# Patient Record
Sex: Male | Born: 1945 | Race: White | Hispanic: No | Marital: Married | Smoking: Former smoker
Health system: Southern US, Community
[De-identification: ages and names within clinical notes are randomized; demographics above are authoritative.]

## PROBLEM LIST (undated history)

## (undated) DIAGNOSIS — J449 Chronic obstructive pulmonary disease, unspecified: Secondary | ICD-10-CM

## (undated) DIAGNOSIS — F329 Major depressive disorder, single episode, unspecified: Secondary | ICD-10-CM

## (undated) DIAGNOSIS — F32A Depression, unspecified: Secondary | ICD-10-CM

## (undated) DIAGNOSIS — M549 Dorsalgia, unspecified: Secondary | ICD-10-CM

## (undated) DIAGNOSIS — E785 Hyperlipidemia, unspecified: Secondary | ICD-10-CM

## (undated) DIAGNOSIS — G8929 Other chronic pain: Secondary | ICD-10-CM

## (undated) DIAGNOSIS — G4730 Sleep apnea, unspecified: Secondary | ICD-10-CM

## (undated) DIAGNOSIS — I10 Essential (primary) hypertension: Secondary | ICD-10-CM

## (undated) DIAGNOSIS — C649 Malignant neoplasm of unspecified kidney, except renal pelvis: Secondary | ICD-10-CM

## (undated) DIAGNOSIS — E119 Type 2 diabetes mellitus without complications: Secondary | ICD-10-CM

## (undated) HISTORY — PX: OTHER SURGICAL HISTORY: SHX169

## (undated) HISTORY — PX: GASTRIC BYPASS: SHX52

## (undated) HISTORY — PX: CHOLECYSTECTOMY: SHX55

## (undated) HISTORY — PX: FLEXIBLE SIGMOIDOSCOPY: SHX1649

## (undated) HISTORY — PX: CARPAL TUNNEL RELEASE: SHX101

## (undated) HISTORY — PX: NEPHRECTOMY: SHX65

## (undated) HISTORY — PX: UPPER GI ENDOSCOPY: SHX6162

## (undated) NOTE — *Deleted (*Deleted)
PROGRESS NOTE  Ad Glave [id number] DOB: [birth year] DOA: 11/07/2020 PCP: Derinda Late, MD   LOS: 2 days   Brief narrative: As per HPI,  [name]  is a 43 y.o. Caucasian male with a known history of type 2 diabetes mellitus, hypertension, COPD, dyslipidemia and obstructive sleep apnea,  end-stage renal disease on hemodialysis, presented to the emergency room with acute onset of blurred vision and has chronic right sided hearing loss.  The patient had an outpatient MRI that revealed multiple punctate acute to subacute infarcts scattered in both superior hemispheres with no hemorrhage or mass-effect.  This was suspicious for embolic event or watershed ischemia. It also showed chronic small vessel ischemic disease.  Noncontrast dedicated internal auditory imaging was negative.    In the ED, vital signs were within normal.  Labs revealed hypokalemia of 3.1 with a BUN of 47 and creatinine 3.84 and calcium was 7.7.  CBC showed anemia. EKG showed normal sinus rhythm with rate of 98 with right bundle branch block and left anterior fascicular block (bifascicular block) and Q waves inferiorly.  Patient was then admitted to hospital for acute CVA.  Neurology was consulted.   Assessment/Plan:  Principal Problem:   Acute CVA (cerebrovascular accident) [facility]) Active Problems:   Essential hypertension   Type II diabetes mellitus with renal manifestations ([place])   Anemia in chronic kidney disease   Chronic obstructive pulmonary disease ([place])   History of left nephrectomy 2004  Bilateral ischemic stroke Neurology on board with suspicion for watershed infarction.  Patient is already on Eliquis for history of DVT PE.  Neurology recommending PT, OT speech therapy.  PT evaluation pending.     2D echocardiogram with left ventricular ejection fraction of 70 to 75% with no regional wall motion abnormality.  Pending carotid duplex.  On low-dose Coreg, blood pressure seems to be stable.   End-stage renal disease on hemodialysis. On MWF schedule. Nephrology on board for hemodialysis.  Plan for hemodialysis today   Hypokalemia. Improved with replacement.  Potassium of 4.4 today.  Type 2 diabetes mellitus with peripheral neuropathy. Continue sliding scale insulin while in the hospital, Accu-Cheks, diabetic diet  Restless leg syndrome. Continue Requip.  GERD. Continue PPI therapy and Carafate.  COPD without exacerbation. Continue inhalers  Essential hypertension. Continue Coreg.  DVT prophylaxis: apixaban (ELIQUIS) tablet 2.5 mg Start: 11/07/20 2200 apixaban (ELIQUIS) tablet 2.5 mg    Code Status: Full code  Family Communication: ??  I spoke with wife Ms. Zanin on the phone and updated her about the clinical condition of the patient.  Status is: Inpatient  Remains inpatient appropriate because:IV treatments appropriate due to intensity of illness or inability to take PO and Inpatient level of care appropriate due to severity of illness   Dispo: The patient is from: Home              Anticipated d/c is to: Home              Anticipated d/c date is: 2 days              Patient currently is not medically stable to d/c.??  Consultants:  Neurology  Procedures:  None  Antibiotics:  . None  Anti-infectives (From admission, onward)   None     Subjective: Today,   ??  patient was seen and examined at bedside.  Patient complains of hearing loss for some time now, including mild right-sided weakness which he was getting physical therapy as outpatient.  Complains  of mild blurry vision.  Denies any speech difficulty or swallowing.  Objective: Vitals:   11/08/20 2300 11/09/20 0412  BP: 126/79 139/81  Pulse: 78 76  Resp: 18 20  Temp: 98 F (36.7 C) 98 F (36.7 C)  SpO2: 98% 98%    Intake/Output Summary (Last 24 hours) at 11/09/2020 0804 Last data filed at 11/09/2020 0600 Gross per 24 hour  Intake 2198.81 ml  Output 3600 ml  Net  -1401.19 ml   Filed Weights   11/07/20 1728  Weight: 111.1 kg   Body mass index is 33.23 kg/m.   Physical Exam:  GENERAL: Patient is alert awake and oriented. Not in obvious distress.  Obese HENT: No scleral pallor or icterus. Pupils equally reactive to light. Oral mucosa is moist NECK: is supple, no gross swelling noted. CHEST: Clear to auscultation. No crackles or wheezes.  Diminished breath sounds bilaterally. CVS: S1 and S2 heard, no murmur. Regular rate and rhythm.  ABDOMEN: Soft, non-tender, bowel sounds are present. EXTREMITIES: No edema. CNS: Right-sided hearing difficulty.  Moving all extremities SKIN: warm and dry without rashes.  Data Review: I have personally reviewed the following laboratory data and studies,  CBC: Recent Labs  Lab 11/07/20 1733 11/09/20 0519  WBC 8.6 7.8  NEUTROABS 6.6  --   HGB 8.6* 8.6*  HCT 26.4* 26.0*  MCV 94.3 94.2  PLT 139* [id number]*   Basic Metabolic Panel: Recent Labs  Lab 11/07/20 1733 11/09/20 0519  NA 143 144  K 3.1* 4.4  CL 108 112*  CO2 23 23  GLUCOSE 152* 126*  BUN 47* 56*  CREATININE 3.84* 3.98*  CALCIUM 7.7* 7.8*  MG 1.6* 2.0   Liver Function Tests: Recent Labs  Lab 11/07/20 1733 11/09/20 0519  AST 15 12*  ALT 14 14  ALKPHOS 117 112  BILITOT 0.4 0.5  PROT 6.8 6.1*  ALBUMIN 3.7 3.3*   No results for input(s): LIPASE, AMYLASE in the last 168 hours. No results for input(s): AMMONIA in the last 168 hours. Cardiac Enzymes: No results for input(s): CKTOTAL, CKMB, CKMBINDEX, TROPONINI in the last 168 hours. BNP (last 3 results) Recent Labs    03/25/20 0557  BNP 31.0    ProBNP (last 3 results) No results for input(s): PROBNP in the last 8760 hours.  CBG: Recent Labs  Lab 11/08/20 0804 11/08/20 1200 11/08/20 1643  GLUCAP 88 124* 147*   Recent Results (from the past 240 hour(s))  Resp Panel by RT-PCR (Flu A&B, Covid) Nasopharyngeal Swab     Status: None   Collection Time: 11/07/20 10:38 PM    Specimen: Nasopharyngeal Swab; Nasopharyngeal(NP) swabs in vial transport medium  Result Value Ref Range Status   SARS Coronavirus 2 by RT PCR NEGATIVE NEGATIVE Final    Comment: (NOTE) SARS-CoV-2 target nucleic acids are NOT DETECTED.  The SARS-CoV-2 RNA is generally detectable in upper respiratory specimens during the acute phase of infection. The lowest concentration of SARS-CoV-2 viral copies this assay can detect is 138 copies/mL. A negative result does not preclude SARS-Cov-2 infection and should not be used as the sole basis for treatment or other patient management decisions. A negative result may occur with  improper specimen collection/handling, submission of specimen other than nasopharyngeal swab, presence of viral mutation(s) within the areas targeted by this assay, and inadequate number of viral copies(<138 copies/mL). A negative result must be combined with clinical observations, patient history, and epidemiological information. The expected result is Negative.  Fact Sheet for Patients:  EntrepreneurPulse.com.au  Fact Sheet for Healthcare Providers:  IncredibleEmployment.be  This test is no t yet approved or cleared by the [place] FDA and  has been authorized for detection and/or diagnosis of SARS-CoV-2 by FDA under an Emergency Use Authorization (EUA). This EUA will remain  in effect (meaning this test can be used) for the duration of the COVID-19 declaration under Section 564(b)(1) of the Act, 21 U.S.C.section 360bbb-3(b)(1), unless the authorization is terminated  or revoked sooner.       Influenza A by PCR NEGATIVE NEGATIVE Final   Influenza B by PCR NEGATIVE NEGATIVE Final    Comment: (NOTE) The Xpert Xpress SARS-CoV-2/FLU/RSV plus assay is intended as an aid in the diagnosis of influenza from Nasopharyngeal swab specimens and should not be used as a sole basis for treatment. Nasal washings and aspirates are  unacceptable for Xpert Xpress SARS-CoV-2/FLU/RSV testing.  Fact Sheet for Patients: EntrepreneurPulse.com.au  Fact Sheet for Healthcare Providers: IncredibleEmployment.be  This test is not yet approved or cleared by the [place] FDA and has been authorized for detection and/or diagnosis of SARS-CoV-2 by FDA under an Emergency Use Authorization (EUA). This EUA will remain in effect (meaning this test can be used) for the duration of the COVID-19 declaration under Section 564(b)(1) of the Act, 21 U.S.C. section 360bbb-3(b)(1), unless the authorization is terminated or revoked.  Performed at [facility], [address] [place], [place] [address]      Studies: ECHOCARDIOGRAM COMPLETE  Result Date: 11/08/2020    ECHOCARDIOGRAM REPORT   Patient Name:   BERISH RIVENBURG Date of Exam: 11/08/2020 Medical Rec #:  [id number]             Height:       72.0 in Accession #:    [id number]            Weight:       245.0 lb Date of Birth:  Mar 15, 1946            BSA:          2.322 m Patient Age:    25 years              BP:           132/80 mmHg Patient Gender: M                     HR:           74 bpm. Exam Location:  [facility] Procedure: 2D Echo, Color Doppler and Cardiac Doppler Indications:     G45.9 TIA  History:         Patient has prior history of Echocardiogram examinations. COPD;                  Risk Factors:Sleep Apnea, Hypertension, Diabetes and                  Dyslipidemia.  Sonographer:     Charmayne Sheer RDCS (AE) Referring Phys:  [id number] Arvella Merles [place] Diagnosing Phys: Bartholome Bill MD  Sonographer Comments: Suboptimal parasternal window. Image acquisition challenging due to COPD. IMPRESSIONS  1. Left ventricular ejection fraction, by estimation, is 70 to 75%. The left ventricle has hyperdynamic function. The left ventricle has no regional wall motion abnormalities. Left ventricular diastolic parameters were normal.  2. Right ventricular systolic  function is normal. The right ventricular size is mildly enlarged.  3. The mitral valve is grossly normal. Trivial mitral valve regurgitation.  4. The aortic  valve is grossly normal. Aortic valve regurgitation is not visualized. FINDINGS  Left Ventricle: Left ventricular ejection fraction, by estimation, is 70 to 75%. The left ventricle has hyperdynamic function. The left ventricle has no regional wall motion abnormalities. The left ventricular internal cavity size was normal in size. There is borderline left ventricular hypertrophy. Left ventricular diastolic parameters were normal. Right Ventricle: The right ventricular size is mildly enlarged. No increase in right ventricular wall thickness. Right ventricular systolic function is normal. Left Atrium: Left atrial size was normal in size. Right Atrium: Right atrial size was normal in size. Pericardium: There is no evidence of pericardial effusion. Mitral Valve: The mitral valve is grossly normal. Trivial mitral valve regurgitation. MV peak gradient, 3.7 mmHg. The mean mitral valve gradient is 2.0 mmHg. Tricuspid Valve: The tricuspid valve is grossly normal. Tricuspid valve regurgitation is mild. Aortic Valve: The aortic valve is grossly normal. Aortic valve regurgitation is not visualized. Aortic valve mean gradient measures 3.0 mmHg. Aortic valve peak gradient measures 5.9 mmHg. Aortic valve area, by VTI measures 6.60 cm. Pulmonic Valve: The pulmonic valve was not well visualized. Pulmonic valve regurgitation is trivial. Aorta: The aortic root was not well visualized. IAS/Shunts: The interatrial septum was not assessed.  LEFT VENTRICLE PLAX 2D LVIDd:         4.91 cm      Diastology LVIDs:         2.89 cm      LV e' medial:    8.49 cm/s LV PW:         1.14 cm      LV E/e' medial:  8.8 LV IVS:        1.07 cm      LV e' lateral:   11.20 cm/s LVOT diam:     2.80 cm      LV E/e' lateral: 6.7 LV SV:         155 LV SV Index:   67 LVOT Area:     6.16 cm  LV Volumes  (MOD) LV vol d, MOD A4C: 182.0 ml LV vol s, MOD A4C: 78.5 ml LV SV MOD A4C:     182.0 ml RIGHT VENTRICLE RV Basal diam:  3.82 cm LEFT ATRIUM             Index       RIGHT ATRIUM           Index LA diam:        4.00 cm 1.72 cm/m  RA Area:     17.60 cm LA Vol (A2C):   88.3 ml 38.02 ml/m RA Volume:   50.20 ml  21.61 ml/m LA Vol (A4C):   68.1 ml 29.32 ml/m LA Biplane Vol: 77.4 ml 33.33 ml/m  AORTIC VALVE                   PULMONIC VALVE AV Area (Vmax):    6.21 cm    PV Vmax:       0.99 m/s AV Area (Vmean):   5.98 cm    PV Vmean:      61.400 cm/s AV Area (VTI):     6.60 cm    PV VTI:        0.160 m AV Vmax:           121.00 cm/s PV Peak grad:  3.9 mmHg AV Vmean:          84.400 cm/s PV Mean grad:  2.0 mmHg AV VTI:  0.235 m AV Peak Grad:      5.9 mmHg AV Mean Grad:      3.0 mmHg LVOT Vmax:         122.00 cm/s LVOT Vmean:        81.900 cm/s LVOT VTI:          0.252 m LVOT/AV VTI ratio: 1.07  AORTA Ao Root diam: 3.30 cm MITRAL VALVE MV Area (PHT): 4.31 cm    SHUNTS MV Peak grad:  3.7 mmHg    Systemic VTI:  0.25 m MV Mean grad:  2.0 mmHg    Systemic Diam: 2.80 cm MV Vmax:       0.96 m/s MV Vmean:      65.5 cm/s MV Decel Time: 176 msec MV E velocity: 75.00 cm/s MV A velocity: 60.40 cm/s MV E/A ratio:  1.24 Bartholome Bill MD Electronically signed by Bartholome Bill MD Signature Date/Time: 11/08/2020/3:48:29 PM    Final       Flora Lipps, MD  [facility] 11/09/2020

---

## 2006-06-10 ENCOUNTER — Ambulatory Visit: Payer: Self-pay | Admitting: Internal Medicine

## 2007-10-29 ENCOUNTER — Ambulatory Visit: Payer: Self-pay | Admitting: Unknown Physician Specialty

## 2010-01-27 ENCOUNTER — Ambulatory Visit: Payer: Self-pay | Admitting: Orthopedic Surgery

## 2010-02-05 ENCOUNTER — Ambulatory Visit: Payer: Self-pay | Admitting: Orthopedic Surgery

## 2010-02-07 ENCOUNTER — Ambulatory Visit: Payer: Self-pay | Admitting: Orthopedic Surgery

## 2011-07-28 ENCOUNTER — Ambulatory Visit: Payer: Self-pay | Admitting: Internal Medicine

## 2011-12-29 ENCOUNTER — Ambulatory Visit: Payer: Self-pay | Admitting: Surgery

## 2011-12-30 LAB — PATHOLOGY REPORT

## 2012-01-16 ENCOUNTER — Ambulatory Visit: Payer: Self-pay | Admitting: Surgery

## 2012-01-16 ENCOUNTER — Inpatient Hospital Stay: Payer: Self-pay | Admitting: Surgery

## 2012-01-16 LAB — HEPATIC FUNCTION PANEL A (ARMC)
Albumin: 2.1 g/dL — ABNORMAL LOW (ref 3.4–5.0)
Alkaline Phosphatase: 157 U/L — ABNORMAL HIGH (ref 50–136)
Bilirubin, Direct: <0.1 mg/dL (ref 0.00–0.20)
Bilirubin,Total: 0.2 mg/dL (ref 0.2–1.0)
SGOT(AST): 29 U/L (ref 15–37)
SGPT (ALT): 29 U/L
Total Protein: 7.1 g/dL (ref 6.4–8.2)

## 2012-01-16 LAB — CREATININE, SERUM
Creatinine: 0.99 mg/dL (ref 0.60–1.30)
EGFR (African American): >60
EGFR (Non-African Amer.): >60

## 2012-01-17 LAB — CBC WITH DIFFERENTIAL/PLATELET
Basophil #: 0 10*3/uL (ref 0.0–0.1)
Basophil %: 0.4 %
Eosinophil #: 0 10*3/uL (ref 0.0–0.7)
Eosinophil %: 0.3 %
HCT: 32.8 % — ABNORMAL LOW (ref 40.0–52.0)
HGB: 11 g/dL — ABNORMAL LOW (ref 13.0–18.0)
Lymphocyte #: 0.6 10*3/uL — ABNORMAL LOW (ref 1.0–3.6)
Lymphocyte %: 5.4 %
MCH: 28.5 pg (ref 26.0–34.0)
MCHC: 33.6 g/dL (ref 32.0–36.0)
MCV: 85 fL (ref 80–100)
Monocyte #: 0.6 10*3/uL (ref 0.0–0.7)
Monocyte %: 5.2 %
Neutrophil #: 10.2 10*3/uL — ABNORMAL HIGH (ref 1.4–6.5)
Neutrophil %: 88.7 %
Platelet: 377 10*3/uL (ref 150–440)
RBC: 3.85 10*6/uL — ABNORMAL LOW (ref 4.40–5.90)
RDW: 13 % (ref 11.5–14.5)
WBC: 11.4 10*3/uL — ABNORMAL HIGH (ref 3.8–10.6)

## 2012-01-17 LAB — PROTIME-INR
INR: 1.1
Prothrombin Time: 14.8 secs — ABNORMAL HIGH (ref 11.5–14.7)

## 2012-01-17 LAB — APTT: Activated PTT: 34.5 secs (ref 23.6–35.9)

## 2012-01-19 LAB — CBC WITH DIFFERENTIAL/PLATELET
Basophil #: 0 10*3/uL (ref 0.0–0.1)
Basophil %: 0 %
Eosinophil #: 0.1 10*3/uL (ref 0.0–0.7)
Eosinophil %: 0.2 %
HCT: 27.4 % — ABNORMAL LOW (ref 40.0–52.0)
HGB: 9.1 g/dL — ABNORMAL LOW (ref 13.0–18.0)
Lymphocyte #: 1.2 10*3/uL (ref 1.0–3.6)
Lymphocyte %: 5.5 %
MCH: 28.4 pg (ref 26.0–34.0)
MCHC: 33 g/dL (ref 32.0–36.0)
MCV: 86 fL (ref 80–100)
Monocyte #: 0.9 10*3/uL — ABNORMAL HIGH (ref 0.0–0.7)
Monocyte %: 4 %
Neutrophil #: 19.9 10*3/uL — ABNORMAL HIGH (ref 1.4–6.5)
Neutrophil %: 90.3 %
Platelet: 480 10*3/uL — ABNORMAL HIGH (ref 150–440)
RBC: 3.18 10*6/uL — ABNORMAL LOW (ref 4.40–5.90)
RDW: 14 % (ref 11.5–14.5)
WBC: 22 10*3/uL — ABNORMAL HIGH (ref 3.8–10.6)

## 2012-01-19 LAB — COMPREHENSIVE METABOLIC PANEL
Albumin: 1.9 g/dL — ABNORMAL LOW (ref 3.4–5.0)
Alkaline Phosphatase: 218 U/L — ABNORMAL HIGH (ref 50–136)
Anion Gap: 8 (ref 7–16)
BUN: 26 mg/dL — ABNORMAL HIGH (ref 7–18)
Bilirubin,Total: 0.3 mg/dL (ref 0.2–1.0)
Calcium, Total: 8.8 mg/dL (ref 8.5–10.1)
Chloride: 99 mmol/L (ref 98–107)
Co2: 29 mmol/L (ref 21–32)
Creatinine: 1.25 mg/dL (ref 0.60–1.30)
EGFR (African American): >60
EGFR (Non-African Amer.): >60
Glucose: 280 mg/dL — ABNORMAL HIGH (ref 65–99)
Osmolality: 287 (ref 275–301)
Potassium: 5 mmol/L (ref 3.5–5.1)
SGOT(AST): 14 U/L — ABNORMAL LOW (ref 15–37)
SGPT (ALT): 61 U/L
Sodium: 136 mmol/L (ref 136–145)
Total Protein: 6.8 g/dL (ref 6.4–8.2)

## 2012-01-19 LAB — GASTROCCULT (ARMC)
Occult Blood, Gastric: POSITIVE
Ph, Gastric: 3 (ref 1–3)

## 2012-01-20 LAB — CBC WITH DIFFERENTIAL/PLATELET
Basophil #: 0.1 10*3/uL (ref 0.0–0.1)
Basophil %: 0.5 %
Eosinophil #: 0.1 10*3/uL (ref 0.0–0.7)
Eosinophil %: 0.6 %
HCT: 23.1 % — ABNORMAL LOW (ref 40.0–52.0)
HGB: 7.6 g/dL — ABNORMAL LOW (ref 13.0–18.0)
Lymphocyte #: 1.6 10*3/uL (ref 1.0–3.6)
Lymphocyte %: 9.6 %
MCH: 28.2 pg (ref 26.0–34.0)
MCHC: 32.8 g/dL (ref 32.0–36.0)
MCV: 86 fL (ref 80–100)
Monocyte #: 0.7 10*3/uL (ref 0.0–0.7)
Monocyte %: 4.4 %
Neutrophil #: 14.3 10*3/uL — ABNORMAL HIGH (ref 1.4–6.5)
Neutrophil %: 84.9 %
Platelet: 347 10*3/uL (ref 150–440)
RBC: 2.69 10*6/uL — ABNORMAL LOW (ref 4.40–5.90)
RDW: 14.2 % (ref 11.5–14.5)
WBC: 16.9 10*3/uL — ABNORMAL HIGH (ref 3.8–10.6)

## 2012-01-20 LAB — HEMOGLOBIN: HGB: 6.9 g/dL — ABNORMAL LOW (ref 13.0–18.0)

## 2012-01-21 LAB — CBC WITH DIFFERENTIAL/PLATELET
Basophil #: 0.2 10*3/uL — ABNORMAL HIGH (ref 0.0–0.1)
Basophil %: 1.1 %
Eosinophil #: 0.1 10*3/uL (ref 0.0–0.7)
Eosinophil %: 0.7 %
HCT: 24.5 % — ABNORMAL LOW (ref 40.0–52.0)
Lymphocyte #: 1.4 10*3/uL (ref 1.0–3.6)
Lymphocyte %: 10.4 %
MCH: 28.4 pg (ref 26.0–34.0)
MCHC: 32.9 g/dL (ref 32.0–36.0)
MCV: 86 fL (ref 80–100)
Monocyte #: 0.8 10*3/uL — ABNORMAL HIGH (ref 0.0–0.7)
Monocyte %: 5.8 %
Neutrophil #: 11.2 10*3/uL — ABNORMAL HIGH (ref 1.4–6.5)
Neutrophil %: 82 %
Platelet: 339 10*3/uL (ref 150–440)
RBC: 2.84 10*6/uL — ABNORMAL LOW (ref 4.40–5.90)
RDW: 14.8 % — ABNORMAL HIGH (ref 11.5–14.5)
WBC: 13.7 10*3/uL — ABNORMAL HIGH (ref 3.8–10.6)

## 2012-01-21 LAB — HEMOGLOBIN
HGB: 8 g/dL — ABNORMAL LOW (ref 13.0–18.0)
HGB: 8.3 g/dL — ABNORMAL LOW (ref 13.0–18.0)

## 2012-01-22 LAB — CBC WITH DIFFERENTIAL/PLATELET
Basophil #: 0 10*3/uL (ref 0.0–0.1)
Basophil %: 0.1 %
Eosinophil #: 0.1 10*3/uL (ref 0.0–0.7)
Eosinophil %: 0.7 %
HCT: 25.4 % — ABNORMAL LOW (ref 40.0–52.0)
HGB: 8.3 g/dL — ABNORMAL LOW (ref 13.0–18.0)
Lymphocyte #: 1.1 10*3/uL (ref 1.0–3.6)
Lymphocyte %: 7.6 %
MCH: 28.3 pg (ref 26.0–34.0)
MCHC: 32.6 g/dL (ref 32.0–36.0)
MCV: 87 fL (ref 80–100)
Monocyte #: 0.8 10*3/uL — ABNORMAL HIGH (ref 0.0–0.7)
Monocyte %: 5.6 %
Neutrophil #: 11.9 10*3/uL — ABNORMAL HIGH (ref 1.4–6.5)
Neutrophil %: 86 %
Platelet: 390 10*3/uL (ref 150–440)
RBC: 2.92 10*6/uL — ABNORMAL LOW (ref 4.40–5.90)
RDW: 15 % — ABNORMAL HIGH (ref 11.5–14.5)
WBC: 13.9 10*3/uL — ABNORMAL HIGH (ref 3.8–10.6)

## 2012-01-22 LAB — MISC AER/ANAEROBIC CULT.

## 2012-01-23 LAB — CREATININE, SERUM
Creatinine: 1.31 mg/dL — ABNORMAL HIGH (ref 0.60–1.30)
EGFR (African American): >60
EGFR (Non-African Amer.): 59 — ABNORMAL LOW

## 2012-01-24 ENCOUNTER — Ambulatory Visit: Payer: Self-pay | Admitting: Urology

## 2012-01-24 LAB — CBC WITH DIFFERENTIAL/PLATELET
Basophil #: 0 10*3/uL (ref 0.0–0.1)
Basophil %: 0.4 %
Eosinophil #: 0.2 10*3/uL (ref 0.0–0.7)
Eosinophil %: 2.2 %
HCT: 23.6 % — ABNORMAL LOW (ref 40.0–52.0)
HGB: 7.8 g/dL — ABNORMAL LOW (ref 13.0–18.0)
Lymphocyte #: 1.5 10*3/uL (ref 1.0–3.6)
Lymphocyte %: 14 %
MCH: 28.7 pg (ref 26.0–34.0)
MCHC: 33.1 g/dL (ref 32.0–36.0)
MCV: 87 fL (ref 80–100)
Monocyte #: 0.6 10*3/uL (ref 0.0–0.7)
Monocyte %: 5.8 %
Neutrophil #: 8.2 10*3/uL — ABNORMAL HIGH (ref 1.4–6.5)
Neutrophil %: 77.6 %
Platelet: 461 10*3/uL — ABNORMAL HIGH (ref 150–440)
RBC: 2.72 10*6/uL — ABNORMAL LOW (ref 4.40–5.90)
RDW: 15.2 % — ABNORMAL HIGH (ref 11.5–14.5)
WBC: 10.6 10*3/uL (ref 3.8–10.6)

## 2012-01-25 LAB — CBC WITH DIFFERENTIAL/PLATELET
Basophil #: 0 10*3/uL (ref 0.0–0.1)
Basophil %: 0.1 %
Eosinophil #: 0.3 10*3/uL (ref 0.0–0.7)
Eosinophil %: 3.2 %
HCT: 25.5 % — ABNORMAL LOW (ref 40.0–52.0)
HGB: 8.5 g/dL — ABNORMAL LOW (ref 13.0–18.0)
Lymphocyte #: 1.8 10*3/uL (ref 1.0–3.6)
Lymphocyte %: 17.7 %
MCH: 28.6 pg (ref 26.0–34.0)
MCHC: 33.1 g/dL (ref 32.0–36.0)
MCV: 86 fL (ref 80–100)
Monocyte #: 0.6 10*3/uL (ref 0.0–0.7)
Monocyte %: 6.1 %
Neutrophil #: 7.3 10*3/uL — ABNORMAL HIGH (ref 1.4–6.5)
Neutrophil %: 72.9 %
Platelet: 555 10*3/uL — ABNORMAL HIGH (ref 150–440)
RBC: 2.96 10*6/uL — ABNORMAL LOW (ref 4.40–5.90)
RDW: 14.8 % — ABNORMAL HIGH (ref 11.5–14.5)
WBC: 10 10*3/uL (ref 3.8–10.6)

## 2012-01-25 LAB — HEPATIC FUNCTION PANEL A (ARMC)
Albumin: 2.1 g/dL — ABNORMAL LOW (ref 3.4–5.0)
Alkaline Phosphatase: 281 U/L — ABNORMAL HIGH (ref 50–136)
Bilirubin, Direct: <0.1 mg/dL (ref 0.00–0.20)
Bilirubin,Total: 0.2 mg/dL (ref 0.2–1.0)
SGOT(AST): 21 U/L (ref 15–37)
SGPT (ALT): 30 U/L
Total Protein: 7.2 g/dL (ref 6.4–8.2)

## 2012-01-25 LAB — WOUND CULTURE

## 2012-01-26 LAB — PATHOLOGY REPORT

## 2012-01-26 LAB — CREATININE, SERUM
Creatinine: 1.66 mg/dL — ABNORMAL HIGH (ref 0.60–1.30)
EGFR (African American): 54 — ABNORMAL LOW
EGFR (Non-African Amer.): 45 — ABNORMAL LOW

## 2012-01-29 LAB — CBC WITH DIFFERENTIAL/PLATELET
Basophil #: 0.1 x10 3/mm 3
Basophil %: 0.7 %
Eosinophil #: 0.4 x10 3/mm 3
Eosinophil %: 3.5 %
HCT: 27.6 % — ABNORMAL LOW
HGB: 9 g/dL — ABNORMAL LOW
Lymphocyte %: 19.2 %
Lymphs Abs: 2 x10 3/mm 3
MCH: 28.4 pg
MCHC: 32.6 g/dL
MCV: 87 fL
Monocyte #: 0.7 x10 3/mm 3
Monocyte %: 6.2 %
Neutrophil #: 7.4 x10 3/mm 3 — ABNORMAL HIGH
Neutrophil %: 70.4 %
Platelet: 640 x10 3/mm 3 — ABNORMAL HIGH
RBC: 3.17 x10 6/mm 3 — ABNORMAL LOW
RDW: 15 % — ABNORMAL HIGH
WBC: 10.5 x10 3/mm 3

## 2012-01-29 LAB — CREATININE, SERUM
Creatinine: 1.65 mg/dL — ABNORMAL HIGH
EGFR (African American): 55 — ABNORMAL LOW
EGFR (Non-African Amer.): 45 — ABNORMAL LOW

## 2012-04-12 ENCOUNTER — Ambulatory Visit: Payer: Self-pay | Admitting: Unknown Physician Specialty

## 2012-04-14 LAB — PATHOLOGY REPORT

## 2012-07-29 ENCOUNTER — Ambulatory Visit: Payer: Self-pay | Admitting: Internal Medicine

## 2012-08-13 ENCOUNTER — Ambulatory Visit: Payer: Self-pay | Admitting: Internal Medicine

## 2012-08-13 LAB — CBC CANCER CENTER
Basophil #: 0.1 x10 3/mm (ref 0.0–0.1)
Basophil %: 0.9 %
Eosinophil #: 0.2 x10 3/mm (ref 0.0–0.7)
Eosinophil %: 3.3 %
HCT: 36.2 % — ABNORMAL LOW (ref 40.0–52.0)
HGB: 11.6 g/dL — ABNORMAL LOW (ref 13.0–18.0)
Lymphocyte #: 1.7 x10 3/mm (ref 1.0–3.6)
Lymphocyte %: 25.4 %
MCH: 26.9 pg (ref 26.0–34.0)
MCHC: 32.2 g/dL (ref 32.0–36.0)
MCV: 84 fL (ref 80–100)
Monocyte #: 0.4 x10 3/mm (ref 0.2–1.0)
Monocyte %: 5.5 %
Neutrophil #: 4.2 x10 3/mm (ref 1.4–6.5)
Neutrophil %: 64.9 %
Platelet: 221 x10 3/mm (ref 150–440)
RBC: 4.33 10*6/uL — ABNORMAL LOW (ref 4.40–5.90)
RDW: 16.6 % — ABNORMAL HIGH (ref 11.5–14.5)
WBC: 6.5 x10 3/mm (ref 3.8–10.6)

## 2012-08-13 LAB — LACTATE DEHYDROGENASE: LDH: 169 U/L (ref 81–234)

## 2012-08-13 LAB — IRON AND TIBC
Iron Bind.Cap.(Total): 444 ug/dL (ref 250–450)
Iron Saturation: 6 %
Iron: 28 ug/dL — ABNORMAL LOW (ref 65–175)
Unbound Iron-Bind.Cap.: 416 ug/dL

## 2012-08-13 LAB — RETICULOCYTES
Absolute Retic Count: 0.0549 10*6/uL (ref 0.031–0.129)
Reticulocyte: 1.27 % (ref 0.7–2.5)

## 2012-08-15 ENCOUNTER — Ambulatory Visit: Payer: Self-pay | Admitting: Internal Medicine

## 2012-09-14 ENCOUNTER — Ambulatory Visit: Payer: Self-pay | Admitting: Internal Medicine

## 2012-12-21 ENCOUNTER — Ambulatory Visit: Payer: Self-pay | Admitting: Internal Medicine

## 2012-12-21 LAB — CBC CANCER CENTER
Basophil #: 0 x10 3/mm (ref 0.0–0.1)
Basophil %: 0.7 %
Eosinophil #: 0.2 x10 3/mm (ref 0.0–0.7)
Eosinophil %: 2.2 %
HCT: 42.2 % (ref 40.0–52.0)
HGB: 14.2 g/dL (ref 13.0–18.0)
Lymphocyte #: 1.7 x10 3/mm (ref 1.0–3.6)
Lymphocyte %: 22.5 %
MCH: 30 pg (ref 26.0–34.0)
MCHC: 33.7 g/dL (ref 32.0–36.0)
MCV: 89 fL (ref 80–100)
Monocyte #: 0.4 x10 3/mm (ref 0.2–1.0)
Monocyte %: 5.2 %
Neutrophil #: 5.2 x10 3/mm (ref 1.4–6.5)
Neutrophil %: 69.4 %
Platelet: 195 x10 3/mm (ref 150–440)
RBC: 4.74 10*6/uL (ref 4.40–5.90)
RDW: 15.4 % — ABNORMAL HIGH (ref 11.5–14.5)
WBC: 7.4 x10 3/mm (ref 3.8–10.6)

## 2012-12-21 LAB — FERRITIN: Ferritin (ARMC): 16 ng/mL (ref 8–388)

## 2012-12-21 LAB — IRON AND TIBC
Iron Bind.Cap.(Total): 391 ug/dL (ref 250–450)
Iron Saturation: 18 %
Iron: 70 ug/dL (ref 65–175)
Unbound Iron-Bind.Cap.: 321 ug/dL

## 2013-01-15 ENCOUNTER — Ambulatory Visit: Payer: Self-pay | Admitting: Internal Medicine

## 2013-04-11 ENCOUNTER — Ambulatory Visit: Payer: Self-pay | Admitting: Internal Medicine

## 2014-01-20 ENCOUNTER — Emergency Department: Payer: Self-pay | Admitting: Emergency Medicine

## 2014-01-20 LAB — COMPREHENSIVE METABOLIC PANEL
Albumin: 3.3 g/dL — ABNORMAL LOW (ref 3.4–5.0)
Alkaline Phosphatase: 122 U/L — ABNORMAL HIGH
Anion Gap: 4 — ABNORMAL LOW (ref 7–16)
BUN: 20 mg/dL — ABNORMAL HIGH (ref 7–18)
Bilirubin,Total: 0.2 mg/dL (ref 0.2–1.0)
Calcium, Total: 8.9 mg/dL (ref 8.5–10.1)
Chloride: 107 mmol/L (ref 98–107)
Co2: 27 mmol/L (ref 21–32)
Creatinine: 1.08 mg/dL (ref 0.60–1.30)
EGFR (African American): >60
EGFR (Non-African Amer.): >60
Glucose: 159 mg/dL — ABNORMAL HIGH (ref 65–99)
Osmolality: 282 (ref 275–301)
Potassium: 4 mmol/L (ref 3.5–5.1)
SGOT(AST): 17 U/L (ref 15–37)
SGPT (ALT): 29 U/L (ref 12–78)
Sodium: 138 mmol/L (ref 136–145)
Total Protein: 7.4 g/dL (ref 6.4–8.2)

## 2014-01-20 LAB — URINALYSIS, COMPLETE
Bacteria: NEGATIVE
Bilirubin,UR: NEGATIVE
Blood: NEGATIVE
Glucose,UR: NEGATIVE mg/dL (ref 0–75)
Ketone: NEGATIVE
Leukocyte Esterase: NEGATIVE
Nitrite: NEGATIVE
Ph: 6 (ref 4.5–8.0)
Protein: 75
RBC,UR: NONE SEEN /HPF (ref 0–5)
Specific Gravity: 1.03 (ref 1.003–1.030)

## 2014-01-20 LAB — CBC WITH DIFFERENTIAL/PLATELET
Basophil #: 0.1 10*3/uL (ref 0.0–0.1)
Basophil %: 0.9 %
Eosinophil #: 0.7 10*3/uL (ref 0.0–0.7)
Eosinophil %: 6.4 %
HCT: 36.6 % — ABNORMAL LOW (ref 40.0–52.0)
HGB: 11.8 g/dL — ABNORMAL LOW (ref 13.0–18.0)
Lymphocyte #: 1.2 10*3/uL (ref 1.0–3.6)
Lymphocyte %: 11.4 %
MCH: 30.9 pg (ref 26.0–34.0)
MCHC: 32.1 g/dL (ref 32.0–36.0)
MCV: 96 fL (ref 80–100)
Monocyte #: 0.5 x10 3/mm (ref 0.2–1.0)
Monocyte %: 5 %
Neutrophil #: 7.9 10*3/uL — ABNORMAL HIGH (ref 1.4–6.5)
Neutrophil %: 76.3 %
Platelet: 292 10*3/uL (ref 150–440)
RBC: 3.81 10*6/uL — ABNORMAL LOW (ref 4.40–5.90)
RDW: 14.5 % (ref 11.5–14.5)
WBC: 10.4 10*3/uL (ref 3.8–10.6)

## 2014-01-20 LAB — LIPASE, BLOOD: Lipase: 173 U/L (ref 73–393)

## 2014-01-20 LAB — TROPONIN I: Troponin-I: <0.02 ng/mL

## 2014-01-26 ENCOUNTER — Inpatient Hospital Stay: Payer: Self-pay | Admitting: Internal Medicine

## 2014-01-26 LAB — HEPATIC FUNCTION PANEL A (ARMC)
Albumin: 2.4 g/dL — ABNORMAL LOW (ref 3.4–5.0)
Alkaline Phosphatase: 112 U/L
Bilirubin, Direct: 0.1 mg/dL (ref 0.00–0.20)
Bilirubin,Total: 0.4 mg/dL (ref 0.2–1.0)
SGOT(AST): 25 U/L (ref 15–37)
SGPT (ALT): 43 U/L (ref 12–78)
Total Protein: 7.1 g/dL (ref 6.4–8.2)

## 2014-01-26 LAB — BODY FLUID CELL COUNT WITH DIFFERENTIAL
Basophil: 0 %
Eosinophil: 7 %
Lymphocytes: 83 %
Neutrophils: 5 %
Nucleated Cell Count: 1445 /mm3
Other Cells BF: 0 %
Other Mononuclear Cells: 5 %

## 2014-01-26 LAB — URINALYSIS, COMPLETE
Bacteria: NONE SEEN
Bilirubin,UR: NEGATIVE
Blood: NEGATIVE
Glucose,UR: 50 mg/dL (ref 0–75)
Granular Cast: 6
Ketone: NEGATIVE
Leukocyte Esterase: NEGATIVE
Nitrite: NEGATIVE
Ph: 5 (ref 4.5–8.0)
Protein: 100
RBC,UR: 1 /HPF (ref 0–5)
Specific Gravity: 1.024 (ref 1.003–1.030)
Squamous Epithelial: <1
WBC UR: 2 /HPF (ref 0–5)

## 2014-01-26 LAB — BASIC METABOLIC PANEL
Anion Gap: 5 — ABNORMAL LOW (ref 7–16)
BUN: 17 mg/dL (ref 7–18)
Calcium, Total: 9.1 mg/dL (ref 8.5–10.1)
Chloride: 101 mmol/L (ref 98–107)
Co2: 28 mmol/L (ref 21–32)
Creatinine: 1.01 mg/dL (ref 0.60–1.30)
EGFR (African American): >60
EGFR (Non-African Amer.): >60
Glucose: 258 mg/dL — ABNORMAL HIGH (ref 65–99)
Osmolality: 279 (ref 275–301)
Potassium: 3.9 mmol/L (ref 3.5–5.1)
Sodium: 134 mmol/L — ABNORMAL LOW (ref 136–145)

## 2014-01-26 LAB — CBC WITH DIFFERENTIAL/PLATELET
Basophil #: 0.1 10*3/uL (ref 0.0–0.1)
Basophil %: 0.8 %
Eosinophil #: 0.1 10*3/uL (ref 0.0–0.7)
Eosinophil %: 1.1 %
HCT: 36 % — ABNORMAL LOW (ref 40.0–52.0)
HGB: 11.9 g/dL — ABNORMAL LOW (ref 13.0–18.0)
Lymphocyte #: 0.6 10*3/uL — ABNORMAL LOW (ref 1.0–3.6)
Lymphocyte %: 4.9 %
MCH: 31.1 pg (ref 26.0–34.0)
MCHC: 33.1 g/dL (ref 32.0–36.0)
MCV: 94 fL (ref 80–100)
Monocyte #: 0.9 x10 3/mm (ref 0.2–1.0)
Monocyte %: 6.8 %
Neutrophil #: 11.1 10*3/uL — ABNORMAL HIGH (ref 1.4–6.5)
Neutrophil %: 86.4 %
Platelet: 285 10*3/uL (ref 150–440)
RBC: 3.84 10*6/uL — ABNORMAL LOW (ref 4.40–5.90)
RDW: 14.4 % (ref 11.5–14.5)
WBC: 12.8 10*3/uL — ABNORMAL HIGH (ref 3.8–10.6)

## 2014-01-26 LAB — TROPONIN I: Troponin-I: <0.02 ng/mL

## 2014-01-26 LAB — PRO B NATRIURETIC PEPTIDE: B-Type Natriuretic Peptide: 393 pg/mL — ABNORMAL HIGH (ref 0–125)

## 2014-01-27 LAB — BASIC METABOLIC PANEL
Anion Gap: 2 — ABNORMAL LOW (ref 7–16)
BUN: 19 mg/dL — ABNORMAL HIGH (ref 7–18)
Calcium, Total: 8.9 mg/dL (ref 8.5–10.1)
Chloride: 103 mmol/L (ref 98–107)
Co2: 31 mmol/L (ref 21–32)
Creatinine: 1.13 mg/dL (ref 0.60–1.30)
EGFR (African American): >60
EGFR (Non-African Amer.): >60
Glucose: 195 mg/dL — ABNORMAL HIGH (ref 65–99)
Osmolality: 280 (ref 275–301)
Potassium: 4.4 mmol/L (ref 3.5–5.1)
Sodium: 136 mmol/L (ref 136–145)

## 2014-01-27 LAB — COMPREHENSIVE METABOLIC PANEL
Albumin: 2 g/dL — ABNORMAL LOW (ref 3.4–5.0)
Alkaline Phosphatase: 91 U/L
Anion Gap: 4 — ABNORMAL LOW (ref 7–16)
BUN: 16 mg/dL (ref 7–18)
Bilirubin,Total: 0.2 mg/dL (ref 0.2–1.0)
Calcium, Total: 8.6 mg/dL (ref 8.5–10.1)
Chloride: 103 mmol/L (ref 98–107)
Co2: 32 mmol/L (ref 21–32)
Creatinine: 1.13 mg/dL (ref 0.60–1.30)
EGFR (African American): >60
EGFR (Non-African Amer.): >60
Glucose: 172 mg/dL — ABNORMAL HIGH (ref 65–99)
Osmolality: 283 (ref 275–301)
Potassium: 4.2 mmol/L (ref 3.5–5.1)
SGOT(AST): 16 U/L (ref 15–37)
SGPT (ALT): 30 U/L (ref 12–78)
Sodium: 139 mmol/L (ref 136–145)
Total Protein: 6.1 g/dL — ABNORMAL LOW (ref 6.4–8.2)

## 2014-01-27 LAB — CBC WITH DIFFERENTIAL/PLATELET
Basophil #: 0.1 10*3/uL (ref 0.0–0.1)
Basophil #: 0 10*3/uL (ref 0.0–0.1)
Basophil %: 0.5 %
Basophil %: 0.4 %
Eosinophil #: 0.2 10*3/uL (ref 0.0–0.7)
Eosinophil #: 0.2 10*3/uL (ref 0.0–0.7)
Eosinophil %: 2.4 %
Eosinophil %: 2.3 %
HCT: 31.3 % — ABNORMAL LOW (ref 40.0–52.0)
HCT: 29.1 % — ABNORMAL LOW (ref 40.0–52.0)
HGB: 10.6 g/dL — ABNORMAL LOW (ref 13.0–18.0)
HGB: 9.4 g/dL — ABNORMAL LOW (ref 13.0–18.0)
Lymphocyte #: 0.8 10*3/uL — ABNORMAL LOW (ref 1.0–3.6)
Lymphocyte #: 0.6 10*3/uL — ABNORMAL LOW (ref 1.0–3.6)
Lymphocyte %: 8.2 %
Lymphocyte %: 6.8 %
MCH: 31.6 pg (ref 26.0–34.0)
MCH: 30.1 pg (ref 26.0–34.0)
MCHC: 34 g/dL (ref 32.0–36.0)
MCHC: 32.2 g/dL (ref 32.0–36.0)
MCV: 93 fL (ref 80–100)
MCV: 93 fL (ref 80–100)
Monocyte #: 0.8 x10 3/mm (ref 0.2–1.0)
Monocyte #: 0.6 x10 3/mm (ref 0.2–1.0)
Monocyte %: 8.2 %
Monocyte %: 5.9 %
Neutrophil #: 8.2 10*3/uL — ABNORMAL HIGH (ref 1.4–6.5)
Neutrophil #: 7.8 10*3/uL — ABNORMAL HIGH (ref 1.4–6.5)
Neutrophil %: 80.7 %
Neutrophil %: 84.6 %
Platelet: 267 10*3/uL (ref 150–440)
Platelet: 248 10*3/uL (ref 150–440)
RBC: 3.36 10*6/uL — ABNORMAL LOW (ref 4.40–5.90)
RBC: 3.12 10*6/uL — ABNORMAL LOW (ref 4.40–5.90)
RDW: 14.8 % — ABNORMAL HIGH (ref 11.5–14.5)
RDW: 15.1 % — ABNORMAL HIGH (ref 11.5–14.5)
WBC: 10.1 10*3/uL (ref 3.8–10.6)
WBC: 9.3 10*3/uL (ref 3.8–10.6)

## 2014-01-27 LAB — HEPATIC FUNCTION PANEL A (ARMC)
Albumin: 2.2 g/dL — ABNORMAL LOW (ref 3.4–5.0)
Alkaline Phosphatase: 99 U/L
Bilirubin, Direct: <0.1 mg/dL (ref 0.00–0.20)
Bilirubin,Total: 0.1 mg/dL — ABNORMAL LOW (ref 0.2–1.0)
SGOT(AST): 21 U/L (ref 15–37)
SGPT (ALT): 35 U/L (ref 12–78)
Total Protein: 6.5 g/dL (ref 6.4–8.2)

## 2014-01-27 LAB — APTT: Activated PTT: 43.3 secs — ABNORMAL HIGH (ref 23.6–35.9)

## 2014-01-27 LAB — HEMOGLOBIN A1C: Hemoglobin A1C: 5.5 % (ref 4.2–6.3)

## 2014-01-27 LAB — GLUCOSE, SEROUS FLUID: Glucose, Body Fluid: 206 mg/dL

## 2014-01-27 LAB — LACTATE DEHYDROGENASE, PLEURAL OR PERITONEAL FLUID: LDH, Body Fluid: 370 U/L

## 2014-01-27 LAB — LACTATE DEHYDROGENASE: LDH: 114 U/L (ref 85–241)

## 2014-01-27 LAB — URINE CULTURE

## 2014-01-27 LAB — PROTEIN, BODY FLUID: Protein, Body Fluid: 4.5 g/dL

## 2014-01-28 LAB — APTT
Activated PTT: 46.2 secs — ABNORMAL HIGH (ref 23.6–35.9)
Activated PTT: 49.5 secs — ABNORMAL HIGH (ref 23.6–35.9)
Activated PTT: 55.9 secs — ABNORMAL HIGH (ref 23.6–35.9)

## 2014-01-29 LAB — APTT
Activated PTT: 86.7 secs — ABNORMAL HIGH (ref 23.6–35.9)
Activated PTT: 51.9 secs — ABNORMAL HIGH (ref 23.6–35.9)
Activated PTT: 53 secs — ABNORMAL HIGH (ref 23.6–35.9)

## 2014-01-29 LAB — HEMOGLOBIN: HGB: 10.1 g/dL — ABNORMAL LOW (ref 13.0–18.0)

## 2014-01-29 LAB — PLATELET COUNT: Platelet: 342 10*3/uL (ref 150–440)

## 2014-01-30 LAB — HEMOGLOBIN: HGB: 9.8 g/dL — ABNORMAL LOW (ref 13.0–18.0)

## 2014-01-30 LAB — CREATININE, SERUM
Creatinine: 1.22 mg/dL (ref 0.60–1.30)
EGFR (African American): >60
EGFR (Non-African Amer.): >60

## 2014-01-30 LAB — APTT: Activated PTT: 42.2 secs — ABNORMAL HIGH (ref 23.6–35.9)

## 2014-01-30 LAB — HEMATOCRIT: HCT: 30.1 % — ABNORMAL LOW (ref 40.0–52.0)

## 2014-01-30 LAB — PROTIME-INR
INR: 1.1
Prothrombin Time: 13.8 secs (ref 11.5–14.7)

## 2014-01-30 LAB — BODY FLUID CULTURE

## 2014-01-30 LAB — PLATELET COUNT: Platelet: 364 10*3/uL (ref 150–440)

## 2014-01-31 LAB — CBC WITH DIFFERENTIAL/PLATELET
Basophil #: 0 10*3/uL (ref 0.0–0.1)
Basophil %: 0.3 %
Eosinophil #: 0 10*3/uL (ref 0.0–0.7)
Eosinophil %: 0.3 %
HCT: 27.8 % — ABNORMAL LOW (ref 40.0–52.0)
HGB: 9.7 g/dL — ABNORMAL LOW (ref 13.0–18.0)
Lymphocyte #: 0.7 10*3/uL — ABNORMAL LOW (ref 1.0–3.6)
Lymphocyte %: 6.8 %
MCH: 32.4 pg (ref 26.0–34.0)
MCHC: 35 g/dL (ref 32.0–36.0)
MCV: 93 fL (ref 80–100)
Monocyte #: 0.7 x10 3/mm (ref 0.2–1.0)
Monocyte %: 6.1 %
Neutrophil #: 9.2 10*3/uL — ABNORMAL HIGH (ref 1.4–6.5)
Neutrophil %: 86.5 %
Platelet: 352 10*3/uL (ref 150–440)
RBC: 3 10*6/uL — ABNORMAL LOW (ref 4.40–5.90)
RDW: 15.7 % — ABNORMAL HIGH (ref 11.5–14.5)
WBC: 10.7 10*3/uL — ABNORMAL HIGH (ref 3.8–10.6)

## 2014-01-31 LAB — CULTURE, BLOOD (SINGLE)

## 2014-01-31 LAB — APTT: Activated PTT: 36.4 secs — ABNORMAL HIGH (ref 23.6–35.9)

## 2014-02-01 LAB — APTT
Activated PTT: 52.9 secs — ABNORMAL HIGH (ref 23.6–35.9)
Activated PTT: 41.8 secs — ABNORMAL HIGH (ref 23.6–35.9)

## 2014-02-01 LAB — CBC WITH DIFFERENTIAL/PLATELET
Basophil #: 0.1 10*3/uL (ref 0.0–0.1)
Basophil %: 0.6 %
Eosinophil #: 0.3 10*3/uL (ref 0.0–0.7)
Eosinophil %: 3 %
HCT: 28.5 % — ABNORMAL LOW (ref 40.0–52.0)
HGB: 9.1 g/dL — ABNORMAL LOW (ref 13.0–18.0)
Lymphocyte #: 0.9 10*3/uL — ABNORMAL LOW (ref 1.0–3.6)
Lymphocyte %: 9.4 %
MCH: 29.7 pg (ref 26.0–34.0)
MCHC: 32 g/dL (ref 32.0–36.0)
MCV: 93 fL (ref 80–100)
Monocyte #: 0.7 x10 3/mm (ref 0.2–1.0)
Monocyte %: 6.9 %
Neutrophil #: 7.7 10*3/uL — ABNORMAL HIGH (ref 1.4–6.5)
Neutrophil %: 80.1 %
Platelet: 397 10*3/uL (ref 150–440)
RBC: 3.07 10*6/uL — ABNORMAL LOW (ref 4.40–5.90)
RDW: 16 % — ABNORMAL HIGH (ref 11.5–14.5)
WBC: 9.7 10*3/uL (ref 3.8–10.6)

## 2014-02-01 LAB — BASIC METABOLIC PANEL
Anion Gap: 3 — ABNORMAL LOW (ref 7–16)
BUN: 14 mg/dL (ref 7–18)
Calcium, Total: 9.1 mg/dL (ref 8.5–10.1)
Chloride: 103 mmol/L (ref 98–107)
Co2: 29 mmol/L (ref 21–32)
Creatinine: 1.16 mg/dL (ref 0.60–1.30)
EGFR (African American): >60
EGFR (Non-African Amer.): >60
Glucose: 209 mg/dL — ABNORMAL HIGH (ref 65–99)
Osmolality: 277 (ref 275–301)
Potassium: 4.3 mmol/L (ref 3.5–5.1)
Sodium: 135 mmol/L — ABNORMAL LOW (ref 136–145)

## 2014-02-02 LAB — HEMOGLOBIN: HGB: 10.4 g/dL — ABNORMAL LOW (ref 13.0–18.0)

## 2014-02-02 LAB — PATHOLOGY REPORT

## 2014-02-03 LAB — WOUND CULTURE

## 2014-02-04 LAB — HEMOGLOBIN: HGB: 9.5 g/dL — ABNORMAL LOW (ref 13.0–18.0)

## 2014-02-04 LAB — CULTURE, BLOOD (SINGLE)

## 2014-02-05 LAB — CBC WITH DIFFERENTIAL/PLATELET
Basophil #: 0.1 10*3/uL (ref 0.0–0.1)
Basophil %: 0.7 %
Eosinophil #: 0.5 10*3/uL (ref 0.0–0.7)
Eosinophil %: 4.8 %
HCT: 28.9 % — ABNORMAL LOW (ref 40.0–52.0)
HGB: 9.4 g/dL — ABNORMAL LOW (ref 13.0–18.0)
Lymphocyte #: 1.1 10*3/uL (ref 1.0–3.6)
Lymphocyte %: 11.3 %
MCH: 29.4 pg (ref 26.0–34.0)
MCHC: 32.6 g/dL (ref 32.0–36.0)
MCV: 90 fL (ref 80–100)
Monocyte #: 0.7 x10 3/mm (ref 0.2–1.0)
Monocyte %: 7.3 %
Neutrophil #: 7.7 10*3/uL — ABNORMAL HIGH (ref 1.4–6.5)
Neutrophil %: 75.9 %
RBC: 3.21 10*6/uL — ABNORMAL LOW (ref 4.40–5.90)
RDW: 16.2 % — ABNORMAL HIGH (ref 11.5–14.5)
WBC: 10.1 10*3/uL (ref 3.8–10.6)

## 2014-02-05 LAB — CREATININE, SERUM
Creatinine: 1.35 mg/dL — ABNORMAL HIGH (ref 0.60–1.30)
EGFR (African American): >60
EGFR (Non-African Amer.): 55 — ABNORMAL LOW

## 2014-02-05 LAB — PLATELET COUNT: Platelet: 550 10*3/uL — ABNORMAL HIGH (ref 150–440)

## 2014-02-06 LAB — BASIC METABOLIC PANEL
Anion Gap: 3 — ABNORMAL LOW (ref 7–16)
BUN: 23 mg/dL — ABNORMAL HIGH (ref 7–18)
Calcium, Total: 9.1 mg/dL (ref 8.5–10.1)
Chloride: 103 mmol/L (ref 98–107)
Co2: 31 mmol/L (ref 21–32)
Creatinine: 1.31 mg/dL — ABNORMAL HIGH (ref 0.60–1.30)
EGFR (African American): >60
EGFR (Non-African Amer.): 57 — ABNORMAL LOW
Glucose: 150 mg/dL — ABNORMAL HIGH (ref 65–99)
Osmolality: 280 (ref 275–301)
Potassium: 4.4 mmol/L (ref 3.5–5.1)
Sodium: 137 mmol/L (ref 136–145)

## 2014-02-06 LAB — CBC WITH DIFFERENTIAL/PLATELET
Basophil #: 0.1 10*3/uL (ref 0.0–0.1)
Basophil %: 0.8 %
Eosinophil #: 0.4 10*3/uL (ref 0.0–0.7)
Eosinophil %: 4.2 %
HCT: 29.6 % — ABNORMAL LOW (ref 40.0–52.0)
HGB: 9.3 g/dL — ABNORMAL LOW (ref 13.0–18.0)
Lymphocyte #: 1.3 10*3/uL (ref 1.0–3.6)
Lymphocyte %: 14.6 %
MCH: 28.4 pg (ref 26.0–34.0)
MCHC: 31.5 g/dL — ABNORMAL LOW (ref 32.0–36.0)
MCV: 90 fL (ref 80–100)
Monocyte #: 0.7 x10 3/mm (ref 0.2–1.0)
Monocyte %: 7.4 %
Neutrophil #: 6.7 10*3/uL — ABNORMAL HIGH (ref 1.4–6.5)
Neutrophil %: 73 %
Platelet: 527 10*3/uL — ABNORMAL HIGH (ref 150–440)
RBC: 3.28 10*6/uL — ABNORMAL LOW (ref 4.40–5.90)
RDW: 16.5 % — ABNORMAL HIGH (ref 11.5–14.5)
WBC: 9.2 10*3/uL (ref 3.8–10.6)

## 2014-02-06 LAB — PROTIME-INR
INR: 1.1
Prothrombin Time: 14.1 secs (ref 11.5–14.7)

## 2014-02-07 LAB — CBC WITH DIFFERENTIAL/PLATELET
Basophil #: 0.1 10*3/uL (ref 0.0–0.1)
Basophil %: 0.9 %
Eosinophil #: 0.4 10*3/uL (ref 0.0–0.7)
Eosinophil %: 3.7 %
HCT: 32.1 % — ABNORMAL LOW (ref 40.0–52.0)
HGB: 10.5 g/dL — ABNORMAL LOW (ref 13.0–18.0)
Lymphocyte #: 1.6 10*3/uL (ref 1.0–3.6)
Lymphocyte %: 17 %
MCH: 29.6 pg (ref 26.0–34.0)
MCHC: 32.7 g/dL (ref 32.0–36.0)
MCV: 90 fL (ref 80–100)
Monocyte #: 0.7 x10 3/mm (ref 0.2–1.0)
Monocyte %: 7.6 %
Neutrophil #: 6.8 10*3/uL — ABNORMAL HIGH (ref 1.4–6.5)
Neutrophil %: 70.8 %
Platelet: 634 10*3/uL — ABNORMAL HIGH (ref 150–440)
RBC: 3.55 10*6/uL — ABNORMAL LOW (ref 4.40–5.90)
RDW: 16.8 % — ABNORMAL HIGH (ref 11.5–14.5)
WBC: 9.5 10*3/uL (ref 3.8–10.6)

## 2014-02-07 LAB — PROTIME-INR
INR: 1.1
Prothrombin Time: 14.2 secs (ref 11.5–14.7)

## 2014-02-10 ENCOUNTER — Ambulatory Visit: Payer: Self-pay | Admitting: Internal Medicine

## 2014-02-12 ENCOUNTER — Inpatient Hospital Stay: Payer: Self-pay | Admitting: Internal Medicine

## 2014-02-12 LAB — CBC
HCT: 24.2 % — ABNORMAL LOW (ref 40.0–52.0)
HGB: 7.5 g/dL — ABNORMAL LOW (ref 13.0–18.0)
MCH: 27.4 pg (ref 26.0–34.0)
MCHC: 31.1 g/dL — ABNORMAL LOW (ref 32.0–36.0)
MCV: 88 fL (ref 80–100)
Platelet: 453 10*3/uL — ABNORMAL HIGH (ref 150–440)
RBC: 2.75 10*6/uL — ABNORMAL LOW (ref 4.40–5.90)
RDW: 17 % — ABNORMAL HIGH (ref 11.5–14.5)
WBC: 16.4 10*3/uL — ABNORMAL HIGH (ref 3.8–10.6)

## 2014-02-12 LAB — COMPREHENSIVE METABOLIC PANEL
Albumin: 2.3 g/dL — ABNORMAL LOW (ref 3.4–5.0)
Alkaline Phosphatase: 104 U/L
Anion Gap: 5 — ABNORMAL LOW (ref 7–16)
BUN: 33 mg/dL — ABNORMAL HIGH (ref 7–18)
Bilirubin,Total: 0.1 mg/dL — ABNORMAL LOW (ref 0.2–1.0)
Calcium, Total: 8.6 mg/dL (ref 8.5–10.1)
Chloride: 108 mmol/L — ABNORMAL HIGH (ref 98–107)
Co2: 25 mmol/L (ref 21–32)
Creatinine: 1.14 mg/dL (ref 0.60–1.30)
EGFR (African American): >60
EGFR (Non-African Amer.): >60
Glucose: 228 mg/dL — ABNORMAL HIGH (ref 65–99)
Osmolality: 290 (ref 275–301)
Potassium: 4.8 mmol/L (ref 3.5–5.1)
SGOT(AST): 11 U/L — ABNORMAL LOW (ref 15–37)
SGPT (ALT): 17 U/L (ref 12–78)
Sodium: 138 mmol/L (ref 136–145)
Total Protein: 7.3 g/dL (ref 6.4–8.2)

## 2014-02-12 LAB — URINALYSIS, COMPLETE
Bacteria: NONE SEEN
Bilirubin,UR: NEGATIVE
Blood: NEGATIVE
Glucose,UR: NEGATIVE mg/dL (ref 0–75)
Ketone: NEGATIVE
Leukocyte Esterase: NEGATIVE
Nitrite: NEGATIVE
Ph: 5 (ref 4.5–8.0)
Protein: 30
RBC,UR: <1 /HPF (ref 0–5)
Specific Gravity: 1.019 (ref 1.003–1.030)
Squamous Epithelial: <1
WBC UR: <1 /HPF (ref 0–5)

## 2014-02-12 LAB — APTT: Activated PTT: 48.3 secs — ABNORMAL HIGH (ref 23.6–35.9)

## 2014-02-12 LAB — PROTIME-INR
INR: 1.2
Prothrombin Time: 15.3 secs — ABNORMAL HIGH (ref 11.5–14.7)

## 2014-02-12 LAB — LIPASE, BLOOD: Lipase: 209 U/L (ref 73–393)

## 2014-02-13 LAB — CBC WITH DIFFERENTIAL/PLATELET
Basophil #: 0.1 10*3/uL (ref 0.0–0.1)
Basophil %: 1 %
Eosinophil #: 0.1 10*3/uL (ref 0.0–0.7)
Eosinophil %: 1.6 %
HCT: 25.1 % — ABNORMAL LOW (ref 40.0–52.0)
HGB: 8.4 g/dL — ABNORMAL LOW (ref 13.0–18.0)
Lymphocyte #: 1.4 10*3/uL (ref 1.0–3.6)
Lymphocyte %: 16.4 %
MCH: 29.5 pg (ref 26.0–34.0)
MCHC: 33.5 g/dL (ref 32.0–36.0)
MCV: 88 fL (ref 80–100)
Monocyte #: 0.5 x10 3/mm (ref 0.2–1.0)
Monocyte %: 5.8 %
Neutrophil #: 6.5 10*3/uL (ref 1.4–6.5)
Neutrophil %: 75.2 %
Platelet: 450 10*3/uL — ABNORMAL HIGH (ref 150–440)
RBC: 2.85 10*6/uL — ABNORMAL LOW (ref 4.40–5.90)
RDW: 16.4 % — ABNORMAL HIGH (ref 11.5–14.5)
WBC: 8.7 10*3/uL (ref 3.8–10.6)

## 2014-02-13 LAB — COMPREHENSIVE METABOLIC PANEL
Albumin: 2.4 g/dL — ABNORMAL LOW (ref 3.4–5.0)
Alkaline Phosphatase: 276 U/L — ABNORMAL HIGH
Anion Gap: 3 — ABNORMAL LOW (ref 7–16)
BUN: 29 mg/dL — ABNORMAL HIGH (ref 7–18)
Bilirubin,Total: 0.2 mg/dL (ref 0.2–1.0)
Calcium, Total: 8.7 mg/dL (ref 8.5–10.1)
Chloride: 109 mmol/L — ABNORMAL HIGH (ref 98–107)
Co2: 26 mmol/L (ref 21–32)
Creatinine: 1.24 mg/dL (ref 0.60–1.30)
EGFR (African American): >60
EGFR (Non-African Amer.): >60
Glucose: 148 mg/dL — ABNORMAL HIGH (ref 65–99)
Osmolality: 284 (ref 275–301)
Potassium: 4.3 mmol/L (ref 3.5–5.1)
SGOT(AST): 182 U/L — ABNORMAL HIGH (ref 15–37)
SGPT (ALT): 124 U/L — ABNORMAL HIGH (ref 12–78)
Sodium: 138 mmol/L (ref 136–145)
Total Protein: 7.5 g/dL (ref 6.4–8.2)

## 2014-02-13 LAB — MAGNESIUM: Magnesium: 2 mg/dL

## 2014-02-13 LAB — PROTIME-INR
INR: 1.2
Prothrombin Time: 15.4 secs — ABNORMAL HIGH (ref 11.5–14.7)

## 2014-02-13 LAB — HEMOGLOBIN: HGB: 7 g/dL — ABNORMAL LOW (ref 13.0–18.0)

## 2014-02-14 LAB — CBC WITH DIFFERENTIAL/PLATELET
Basophil #: 0.1 10*3/uL (ref 0.0–0.1)
Basophil %: 1 %
Eosinophil #: 0.2 10*3/uL (ref 0.0–0.7)
Eosinophil %: 2.4 %
HCT: 26.4 % — ABNORMAL LOW (ref 40.0–52.0)
HGB: 8.3 g/dL — ABNORMAL LOW (ref 13.0–18.0)
Lymphocyte #: 1 10*3/uL (ref 1.0–3.6)
Lymphocyte %: 14.4 %
MCH: 27.7 pg (ref 26.0–34.0)
MCHC: 31.5 g/dL — ABNORMAL LOW (ref 32.0–36.0)
MCV: 88 fL (ref 80–100)
Monocyte #: 0.3 x10 3/mm (ref 0.2–1.0)
Monocyte %: 4.7 %
Neutrophil #: 5.1 10*3/uL (ref 1.4–6.5)
Neutrophil %: 77.5 %
Platelet: 444 10*3/uL — ABNORMAL HIGH (ref 150–440)
RBC: 3 10*6/uL — ABNORMAL LOW (ref 4.40–5.90)
RDW: 16.5 % — ABNORMAL HIGH (ref 11.5–14.5)
WBC: 6.6 10*3/uL (ref 3.8–10.6)

## 2014-02-17 ENCOUNTER — Emergency Department: Payer: Self-pay | Admitting: Emergency Medicine

## 2014-02-17 LAB — CBC
HCT: 25.9 % — ABNORMAL LOW (ref 40.0–52.0)
HGB: 8.5 g/dL — ABNORMAL LOW (ref 13.0–18.0)
MCH: 29.1 pg (ref 26.0–34.0)
MCHC: 32.9 g/dL (ref 32.0–36.0)
MCV: 89 fL (ref 80–100)
Platelet: 403 10*3/uL (ref 150–440)
RBC: 2.92 10*6/uL — ABNORMAL LOW (ref 4.40–5.90)
RDW: 17.4 % — ABNORMAL HIGH (ref 11.5–14.5)
WBC: 8.5 10*3/uL (ref 3.8–10.6)

## 2014-02-17 LAB — BASIC METABOLIC PANEL
Anion Gap: 7 (ref 7–16)
BUN: 18 mg/dL (ref 7–18)
Calcium, Total: 8.5 mg/dL (ref 8.5–10.1)
Chloride: 110 mmol/L — ABNORMAL HIGH (ref 98–107)
Co2: 22 mmol/L (ref 21–32)
Creatinine: 0.95 mg/dL (ref 0.60–1.30)
EGFR (African American): >60
EGFR (Non-African Amer.): >60
Glucose: 118 mg/dL — ABNORMAL HIGH (ref 65–99)
Osmolality: 281 (ref 275–301)
Potassium: 3.7 mmol/L (ref 3.5–5.1)
Sodium: 139 mmol/L (ref 136–145)

## 2014-02-17 LAB — TROPONIN I: Troponin-I: <0.02 ng/mL

## 2014-02-18 ENCOUNTER — Ambulatory Visit: Payer: Self-pay | Admitting: Internal Medicine

## 2014-02-20 LAB — CULTURE, FUNGUS WITHOUT SMEAR

## 2014-03-02 ENCOUNTER — Ambulatory Visit: Payer: Self-pay | Admitting: Specialist

## 2014-04-21 ENCOUNTER — Ambulatory Visit: Payer: Self-pay | Admitting: Unknown Physician Specialty

## 2014-06-19 DIAGNOSIS — F411 Generalized anxiety disorder: Secondary | ICD-10-CM | POA: Insufficient documentation

## 2014-06-19 DIAGNOSIS — E119 Type 2 diabetes mellitus without complications: Secondary | ICD-10-CM | POA: Insufficient documentation

## 2014-06-19 DIAGNOSIS — E1129 Type 2 diabetes mellitus with other diabetic kidney complication: Secondary | ICD-10-CM | POA: Insufficient documentation

## 2014-06-19 DIAGNOSIS — E785 Hyperlipidemia, unspecified: Secondary | ICD-10-CM | POA: Insufficient documentation

## 2014-06-19 DIAGNOSIS — N189 Chronic kidney disease, unspecified: Secondary | ICD-10-CM | POA: Insufficient documentation

## 2014-06-22 ENCOUNTER — Inpatient Hospital Stay: Payer: Self-pay | Admitting: Internal Medicine

## 2014-06-22 LAB — URINALYSIS, COMPLETE
Bacteria: NONE SEEN
Bilirubin,UR: NEGATIVE
Blood: NEGATIVE
Glucose,UR: NEGATIVE mg/dL (ref 0–75)
Ketone: NEGATIVE
Leukocyte Esterase: NEGATIVE
Nitrite: NEGATIVE
Ph: 5 (ref 4.5–8.0)
Protein: 30
RBC,UR: NONE SEEN /HPF (ref 0–5)
Specific Gravity: 1.015 (ref 1.003–1.030)
Squamous Epithelial: NONE SEEN
WBC UR: <1 /HPF (ref 0–5)

## 2014-06-22 LAB — CBC WITH DIFFERENTIAL/PLATELET
Basophil #: 0.1 10*3/uL (ref 0.0–0.1)
Basophil %: 0.6 %
Eosinophil #: 0.1 10*3/uL (ref 0.0–0.7)
Eosinophil %: 0.6 %
HCT: 36 % — ABNORMAL LOW (ref 40.0–52.0)
HGB: 11.5 g/dL — ABNORMAL LOW (ref 13.0–18.0)
Lymphocyte #: 0.9 10*3/uL — ABNORMAL LOW (ref 1.0–3.6)
Lymphocyte %: 5.8 %
MCH: 29.2 pg (ref 26.0–34.0)
MCHC: 32 g/dL (ref 32.0–36.0)
MCV: 91 fL (ref 80–100)
Monocyte #: 0.7 x10 3/mm (ref 0.2–1.0)
Monocyte %: 4.5 %
Neutrophil #: 13.3 10*3/uL — ABNORMAL HIGH (ref 1.4–6.5)
Neutrophil %: 88.5 %
Platelet: 188 10*3/uL (ref 150–440)
RBC: 3.94 10*6/uL — ABNORMAL LOW (ref 4.40–5.90)
RDW: 14.7 % — ABNORMAL HIGH (ref 11.5–14.5)
WBC: 15.1 10*3/uL — ABNORMAL HIGH (ref 3.8–10.6)

## 2014-06-22 LAB — COMPREHENSIVE METABOLIC PANEL
Albumin: 3.2 g/dL — ABNORMAL LOW (ref 3.4–5.0)
Alkaline Phosphatase: 104 U/L
Anion Gap: 11 (ref 7–16)
BUN: 28 mg/dL — ABNORMAL HIGH (ref 7–18)
Bilirubin,Total: 0.4 mg/dL (ref 0.2–1.0)
Calcium, Total: 8.2 mg/dL — ABNORMAL LOW (ref 8.5–10.1)
Chloride: 109 mmol/L — ABNORMAL HIGH (ref 98–107)
Co2: 21 mmol/L (ref 21–32)
Creatinine: 1.37 mg/dL — ABNORMAL HIGH (ref 0.60–1.30)
EGFR (African American): >60
EGFR (Non-African Amer.): 54 — ABNORMAL LOW
Glucose: 200 mg/dL — ABNORMAL HIGH (ref 65–99)
Osmolality: 292 (ref 275–301)
Potassium: 3.6 mmol/L (ref 3.5–5.1)
SGOT(AST): 10 U/L — ABNORMAL LOW (ref 15–37)
SGPT (ALT): 18 U/L (ref 12–78)
Sodium: 141 mmol/L (ref 136–145)
Total Protein: 6.9 g/dL (ref 6.4–8.2)

## 2014-06-22 LAB — TROPONIN I: Troponin-I: <0.02 ng/mL

## 2014-06-22 LAB — MAGNESIUM: Magnesium: 1.7 mg/dL — ABNORMAL LOW

## 2014-06-22 LAB — PROTIME-INR
INR: 1.1
Prothrombin Time: 13.7 secs (ref 11.5–14.7)

## 2014-06-22 LAB — PHOSPHORUS: Phosphorus: 2.7 mg/dL (ref 2.5–4.9)

## 2014-06-23 LAB — CBC WITH DIFFERENTIAL/PLATELET
Basophil #: 0 10*3/uL (ref 0.0–0.1)
Basophil %: 0.2 %
Eosinophil #: 0.1 10*3/uL (ref 0.0–0.7)
Eosinophil %: 1.1 %
HCT: 31.5 % — ABNORMAL LOW (ref 40.0–52.0)
HGB: 10.3 g/dL — ABNORMAL LOW (ref 13.0–18.0)
Lymphocyte #: 1 10*3/uL (ref 1.0–3.6)
Lymphocyte %: 8.8 %
MCH: 30.9 pg (ref 26.0–34.0)
MCHC: 32.8 g/dL (ref 32.0–36.0)
MCV: 94 fL (ref 80–100)
Monocyte #: 0.6 x10 3/mm (ref 0.2–1.0)
Monocyte %: 5.4 %
Neutrophil #: 9.3 10*3/uL — ABNORMAL HIGH (ref 1.4–6.5)
Neutrophil %: 84.5 %
Platelet: 152 10*3/uL (ref 150–440)
RBC: 3.35 10*6/uL — ABNORMAL LOW (ref 4.40–5.90)
RDW: 14.6 % — ABNORMAL HIGH (ref 11.5–14.5)
WBC: 11 10*3/uL — ABNORMAL HIGH (ref 3.8–10.6)

## 2014-06-23 LAB — IRON AND TIBC
Iron Bind.Cap.(Total): 291 ug/dL (ref 250–450)
Iron Saturation: 12 %
Iron: 34 ug/dL — ABNORMAL LOW (ref 65–175)
Unbound Iron-Bind.Cap.: 257 ug/dL

## 2014-06-23 LAB — BASIC METABOLIC PANEL
Anion Gap: 8 (ref 7–16)
BUN: 24 mg/dL — ABNORMAL HIGH (ref 7–18)
Calcium, Total: 7.8 mg/dL — ABNORMAL LOW (ref 8.5–10.1)
Chloride: 112 mmol/L — ABNORMAL HIGH (ref 98–107)
Co2: 24 mmol/L (ref 21–32)
Creatinine: 1.21 mg/dL (ref 0.60–1.30)
EGFR (African American): >60
EGFR (Non-African Amer.): >60
Glucose: 120 mg/dL — ABNORMAL HIGH (ref 65–99)
Osmolality: 292 (ref 275–301)
Potassium: 3.2 mmol/L — ABNORMAL LOW (ref 3.5–5.1)
Sodium: 144 mmol/L (ref 136–145)

## 2014-06-24 LAB — CBC WITH DIFFERENTIAL/PLATELET
Basophil #: 0 10*3/uL (ref 0.0–0.1)
Basophil %: 0.2 %
Eosinophil #: 0.1 10*3/uL (ref 0.0–0.7)
Eosinophil %: 1.8 %
HCT: 31.3 % — ABNORMAL LOW (ref 40.0–52.0)
HGB: 10.2 g/dL — ABNORMAL LOW (ref 13.0–18.0)
Lymphocyte #: 0.6 10*3/uL — ABNORMAL LOW (ref 1.0–3.6)
Lymphocyte %: 7.6 %
MCH: 30 pg (ref 26.0–34.0)
MCHC: 32.5 g/dL (ref 32.0–36.0)
MCV: 92 fL (ref 80–100)
Monocyte #: 0.5 x10 3/mm (ref 0.2–1.0)
Monocyte %: 5.7 %
Neutrophil #: 6.8 10*3/uL — ABNORMAL HIGH (ref 1.4–6.5)
Neutrophil %: 84.7 %
Platelet: 157 10*3/uL (ref 150–440)
RBC: 3.39 10*6/uL — ABNORMAL LOW (ref 4.40–5.90)
RDW: 14.5 % (ref 11.5–14.5)
WBC: 8 10*3/uL (ref 3.8–10.6)

## 2014-06-24 LAB — BASIC METABOLIC PANEL
Anion Gap: 8 (ref 7–16)
BUN: 14 mg/dL (ref 7–18)
Calcium, Total: 8.4 mg/dL — ABNORMAL LOW (ref 8.5–10.1)
Chloride: 113 mmol/L — ABNORMAL HIGH (ref 98–107)
Co2: 24 mmol/L (ref 21–32)
Creatinine: 1.13 mg/dL (ref 0.60–1.30)
EGFR (African American): >60
EGFR (Non-African Amer.): >60
Glucose: 145 mg/dL — ABNORMAL HIGH (ref 65–99)
Osmolality: 292 (ref 275–301)
Potassium: 3.6 mmol/L (ref 3.5–5.1)
Sodium: 145 mmol/L (ref 136–145)

## 2014-06-24 LAB — VANCOMYCIN, TROUGH: Vancomycin, Trough: 14 ug/mL (ref 10–20)

## 2014-06-24 LAB — TSH: Thyroid Stimulating Horm: 1.29 u[IU]/mL

## 2014-06-25 LAB — EXPECTORATED SPUTUM ASSESSMENT W REFEX TO RESP CULTURE

## 2014-06-27 LAB — CULTURE, BLOOD (SINGLE)

## 2014-10-18 ENCOUNTER — Ambulatory Visit: Payer: Self-pay | Admitting: Vascular Surgery

## 2015-01-09 ENCOUNTER — Emergency Department: Payer: Self-pay | Admitting: Emergency Medicine

## 2015-01-10 ENCOUNTER — Ambulatory Visit: Payer: Self-pay | Admitting: Orthopedic Surgery

## 2015-01-10 LAB — BASIC METABOLIC PANEL
Anion Gap: 7 (ref 7–16)
BUN: 24 mg/dL — ABNORMAL HIGH (ref 7–18)
Calcium, Total: 9.2 mg/dL (ref 8.5–10.1)
Chloride: 110 mmol/L — ABNORMAL HIGH (ref 98–107)
Co2: 25 mmol/L (ref 21–32)
Creatinine: 1.13 mg/dL (ref 0.60–1.30)
EGFR (African American): >60
EGFR (Non-African Amer.): >60
Glucose: 144 mg/dL — ABNORMAL HIGH (ref 65–99)
Osmolality: 290 (ref 275–301)
Potassium: 4.3 mmol/L (ref 3.5–5.1)
Sodium: 142 mmol/L (ref 136–145)

## 2015-01-10 LAB — CBC
HCT: 38.2 % — ABNORMAL LOW (ref 40.0–52.0)
HGB: 12.5 g/dL — ABNORMAL LOW (ref 13.0–18.0)
MCH: 29.8 pg (ref 26.0–34.0)
MCHC: 32.6 g/dL (ref 32.0–36.0)
MCV: 91 fL (ref 80–100)
Platelet: 200 10*3/uL (ref 150–440)
RBC: 4.17 10*6/uL — ABNORMAL LOW (ref 4.40–5.90)
RDW: 13.8 % (ref 11.5–14.5)
WBC: 8.1 10*3/uL (ref 3.8–10.6)

## 2015-01-11 ENCOUNTER — Ambulatory Visit: Payer: Self-pay | Admitting: Orthopedic Surgery

## 2015-04-07 NOTE — Consult Note (Signed)
Brief Consult Note: Diagnosis: chest pain/sob.   Patient was seen by consultant.   Consult note dictated.   Comments: Appreciate consult for 69 y/o caucasian man with history of nephrectomy secondary to renal cancer, gastric bypass 2009 and PUD 2013/choelcystectomy 2013, admitted with cp/sob: found to have large right pleural effusion s/p chest tube insertion/Vats decortication for empyema, and PE with high dose lovenox therapy,  for evaluation of epigastric abdominal pain. States this is what really originally drove him to the hospital. Onset was about the 1st of this month when his PPI was stopped by his nephrologist due to concerns of possible interstitial nephritis during evaluation for recent proteinuria. Associated with some dyspepsia. Has been taking Zantac 150mg  po tid and Carafate tid at home, but not really helpful. Is feeling better now since he has been hospitalized. Currently 0/10 pain. Reports it is variable and can be as high as a 10/10. Spicy foods aggravate. Restarted on on Pantoprazole today. Carafate here 1gm qid.Denies NVD/melena/odynophagia/dysphagia/weight loss, and all further GI related complaints. 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 Fu EGD 4/13 demonstrated healing to these.  Patient worried his ulcers have returned. Impression and plan: Epigastric pain: likely multifactorial- did discuss rebound symptoms with abrupt PPI discontinuance. Better today. No alarm signs. PPI has been restarted. In discussion with pharmacist, interstitial nephritis relating to PPI, it is very rare, less than 1%. Further literature shows it is a class effect but that no active metabolites pass through the kidneys. Would recommend continuing PPI currently, observing renal status and ensuring hydration. Did discuss with Dr Gustavo Lah and we will d/w Dr Holley Raring. With his recent finding of PE/anticoagulation, EGD is a higher risk. Will follow clinically..  Electronic Signatures: Theodore Demark (NP)  (Signed 23-Feb-15 18:59)  Authored: Brief Consult Note   Last Updated: 23-Feb-15 18:59 by Theodore Demark (NP)

## 2015-04-07 NOTE — Consult Note (Signed)
PATIENT NAME:  [name], SHAMBACH MR#:  [id number] DATE OF BIRTH:  Feb 09, 1946  DATE OF CONSULTATION:  02/13/2014  REFERRING PHYSICIAN:   CONSULTING PHYSICIAN:  Lupita Dawn. Bernadett Milian, MD  REASON FOR REFERRAL: Acute GI bleeding with melena.   HISTORY OF PRESENT ILLNESS: The patient is a 69 year old white male with recent history of right lower lobe pneumonia with empyema requiring antibiotics and chest tube placement. He also has a recent history of pulmonary embolism from a DVT. The patient was discharged to home on Coumadin and Lovenox. The patient had some issues with some bleeding and abdominal pain last time, but because of lung issues EGD was not done. He had an upper endoscopy by Dr. Vira Agar in 2013 that showed evidence of an anastomotic ulcer. The patient was sent home on Carafate 3 times a day and Protonix twice a day. The patient comes in with GI bleeding associated with severe melena, dark stools and feeling weak and lightheaded. His hemoglobin had fallen to 7.5. When he was discharged hemoglobin was 10.5.   The patient just underwent IVC filter this morning before I saw him so that he does not have to go back on anticoagulation.   REVIEW OF SYSTEMS: There are no fevers or chills, but he does complain of weakness and fatigue. There are no visual or hearing changes, no coughing or chest pain. There are no palpitations or chills or shortness of breath. There is no nausea, vomiting, or abdominal pain other than just some gross melena. The rest of the review of systems is negative.   PAST MEDICAL HISTORY: Notable for ulcer disease, pneumonia, pulmonary embolism and deep vein thrombosis.   PAST SURGICAL HISTORY: Include nephrectomy, gastric bypass, cholecystectomy and chest tube placement.  ALLERGIES: No known drug allergies.   HOME MEDICATIONS: Include Coumadin 7.5 mg daily, tramadol 40 mg 4 times a day, senna, Protonix twice a day, Carafate 3 times a day, Prozac 20 mg 2 tablets daily, Norco daily,  multivitamins, glyburide 1.25 mg daily, iron twice a day, Lovenox twice a day, Colace.  SOCIAL HISTORY: He used to smoke but quit 3 months ago. There is no alcohol history.   FAMILY HISTORY: Notable for lung cancer and coronary artery disease.   PHYSICAL EXAMINATION: GENERAL: The patient is in no acute distress right now.  VITAL SIGNS: He is afebrile.  HEAD AND NECK: Within normal limits.  CARDIAC: Regular rhythm and rate.  LUNGS: Clear bilaterally.  ABDOMEN: Normoactive bowel sounds, soft, nontender. There is no hepatomegaly. He has active bowel sounds.  EXTREMITIES: No clubbing, cyanosis, or edema.  NEUROLOGIC: Nonfocal.  SKIN: Negative.   LABORATORY DATA: Sodium 138, potassium 4.8, chloride 108, BUN 33, creatinine 1.14, glucose 228. Liver enzymes are normal. Hemoglobin is 7.5; it was 10.5 on February the 24th. INR is 1.2, even though he has been on Coumadin.   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 Even though the patient has been on Protonix and Carafate we need to rule out anastomotic ulcer bleed again. I am sure the ulcer did not heal properly because of the Coumadin and Lovenox use. We will plan on doing an endoscopy later today. If there is an ulcer that could be bleeding, I will try to cauterize the bleeding so he will not bleed again in the future. Thank you for the referral.   ____________________________ Lupita Dawn.  Candace Cruise, MD pyo:sb D: 02/13/2014 15:27:46 ET T: 02/13/2014 16:01:45 ET JOB#: [id number]  cc: Lupita Dawn. Candace Cruise, MD, <Dictator> Lupita Dawn Lydie Stammen MD ELECTRONICALLY SIGNED 02/13/2014 19:06

## 2015-04-07 NOTE — Consult Note (Signed)
CHIEF COMPLAINT and HISTORY:  Subjective/Chief Complaint GI bleed   History of Present Illness Patient recently diagnosed with PE/DVT and went home on anticoagulation.  Has a history of PUD, and returns today with >3 gm drop in Hgb and GI bleeding.  As anticoagulation will likely have to be stopped, we are asked to see for IVC filter.  Leg pain mild at this point.  Mild abdominal pain.   PAST MEDICAL/SURGICAL HISTORY:  Past Medical History:   Hepatomegaly:    ulcers:    proteinuria:    Sleep Apnea:    Diabetes Mellitus, Type II (NIDD):    Hyperlipidemia:    HTN:    Tobacco Use:    COPD:    Kidney Cancer: 2004   Cholecystectomy:    Gastric Bypass: 2010   Left Nephrectomy: 2004  ALLERGIES:  Allergies:  No Known Allergies:   HOME MEDICATIONS:  Home Medications: Medication Instructions Status  glyBURIDE 1.25 mg oral tablet 1 tab(s) orally once a day Active  Norco 325 mg-5 mg oral tablet 2 tab(s) orally once a day, As Needed - for Pain Active  Protonix 40 mg oral granule, enteric coated 1 each orally 2 times a day Active  enoxaparin 105 milligram(s) subcutaneous 2 times a day x 5 days Active  senna 1 tab(s) orally once a day, As Needed - for Constipation Active  docusate sodium 100 mg oral capsule 1 cap(s) orally 2 times a day Active  traMADol 50 mg oral tablet 1 tab(s) orally 4 times a day - for Pain Active  paroxetine 20 mg oral tablet 2 tab(s) orally once a day Active  multivitamin 1 tab(s) orally once a day Active  Ferrex-150 (as elemental iron) 150 mg oral capsule 1 cap(s) orally 2 times a day Active  warfarin 7.5 mg oral tablet 1 tab(s) orally once a day Active  Carafate 1 g oral tablet 1 tab(s) orally 3 times a day Active   Family and Social History:  Family History Non-Contributory   Social History positive  tobacco (Current within 1 year), negative ETOH   Place of Living Home   Review of Systems:  Fever/Chills No   Cough Yes   Sputum No    Abdominal Pain Yes   Diarrhea No   Constipation No   Nausea/Vomiting No   SOB/DOE Yes   Chest Pain No   Telemetry Reviewed NSR   Dysuria No   Tolerating PT Yes   Tolerating Diet Yes   Medications/Allergies Reviewed Medications/Allergies reviewed   Physical Exam:  GEN well developed, well nourished, pale appearing   HEENT pink conjunctivae, moist oral mucosa   NECK No masses  trachea midline   RESP normal resp effort  no use of accessory muscles   CARD regular rate  no JVD   VASCULAR ACCESS none   ABD denies tenderness  normal BS   GU no superpubic tenderness   LYMPH negative neck, negative axillae   EXTR negative cyanosis/clubbing, positive edema   SKIN normal to palpation, skin turgor good   NEURO cranial nerves intact, motor/sensory function intact   PSYCH alert, A+O to time, place, person   LABS:  Laboratory Results: Rhogam:    01-Mar-15 13:29, Antibody Identification  Notification   PREVIOUSLY NOTIFIED  Notification Date   NOT APPLICABLE  Notification Time   NOT APPLICABLE   Result(s) reported on 12 Feb 2014 at 01:48PM.  Hepatic:    01-Mar-15 11:56, Comprehensive Metabolic Panel  Bilirubin, Total 0.1  Alkaline Phosphatase 104  88-325  NOTE: New Reference Range  11/04/13  SGPT (ALT) 17  SGOT (AST) 11  Total Protein, Serum 7.3  Albumin, Serum 2.3  Routine BB:    01-Mar-15 12:39, Crossmatch 3 Units  Crossmatch Unit 1 Ready  Crossmatch Unit 2 Ready  Crossmatch Unit 3 Ready  Result(s) reported on 12 Feb 2014 at 02:18PM.    01-Mar-15 12:39, Type and Antibody Screen  ABO Group + Rh Type   O Positive  Antibody Screen POSITIVE  Result(s) reported on 12 Feb 2014 at 01:29PM.    01-Mar-15 13:29, Antibody Identification  Antibody 1 Anti- DUFFY A  Routine Chem:    01-Mar-15 11:56, Comprehensive Metabolic Panel  Glucose, Serum 228  BUN 33  Creatinine (comp) 1.14  Sodium, Serum 138  Potassium, Serum 4.8  Chloride, Serum 108  CO2, Serum  25  Calcium (Total), Serum 8.6  Osmolality (calc) 290  eGFR (African American) >60  eGFR (Non-African American) >60  eGFR values <62m/min/1.73 m2 may be an indication of chronic  kidney disease (CKD).  Calculated eGFR is useful in patients with stable renal function.  The eGFR calculation will not be reliable in acutely ill patients  when serum creatinine is changing rapidly. It is not useful in   patients on dialysis. The eGFR calculation may not be applicable  to patients at the low and high extremes of body sizes, pregnant  women, and vegetarians.  Anion Gap 5    01-Mar-15 11:56, Lipase  Lipase 209  Result(s) reported on 12 Feb 2014 at 12:14PM.  Routine UA:    01-Mar-15 11:55, Urinalysis  Color (UA) Yellow  Clarity (UA) Clear  Glucose (UA) Negative  Bilirubin (UA) Negative  Ketones (UA) Negative  Specific Gravity (UA) 1.019  Blood (UA) Negative  pH (UA) 5.0  Protein (UA) 30 mg/dL  Nitrite (UA) Negative  Leukocyte Esterase (UA) Negative  Result(s) reported on 12 Feb 2014 at 12:27PM.  RBC (UA) <1 /HPF  WBC (UA) <1 /HPF  Bacteria (UA)   NONE SEEN  Epithelial Cells (UA) <1 /HPF  Result(s) reported on 12 Feb 2014 at 12:27PM.  Routine Coag:    01-Mar-15 11:56, Activated PTT  Activated PTT (APTT) 48.3  A HCT value >55% may artifactually increase the APTT. In one study,  the increase was an average of 19%.  Reference: "Effect on Routine and Special Coagulation Testing Values  of Citrate Anticoagulant Adjustment in Patients with High HCT Values."  American Journal of Clinical Pathology 2006;126:400-405.    01-Mar-15 11:56, Prothrombin Time  Prothrombin 15.3  INR 1.2  INR reference interval applies to patients on anticoagulant therapy.  A single INR therapeutic range for coumarins is not optimal for all  indications; however, the suggested range for most indications is  2.0 - 3.0.  Exceptions to the INR Reference Range may include: Prosthetic heart  valves, acute  myocardial infarction, prevention of myocardial  infarction, and combinations of aspirin and anticoagulant. The need  for a higher or lower target INR must be assessed individually.  Reference: The Pharmacology and Management of the Vitamin K   antagonists: the seventh ACCP Conference on Antithrombotic and  Thrombolytic Therapy. CQDIYM.4158Sept:126 (3suppl): [id number]  A HCT value >55% may artifactually increase the PT.  In one study,   the increase was an average of 25%.  Reference:  "Effect on Routine and Special Coagulation Testing Values  of Citrate Anticoagulant Adjustment in Patients with High HCT Values."  American Journal of Clinical Pathology 2006;126:400-405.  Routine Hem:  01-Mar-15 11:56, Hemogram, Platelet Count  WBC (CBC) 16.4  RBC (CBC) 2.75  Hemoglobin (CBC) 7.5  Hematocrit (CBC) 24.2  Platelet Count (CBC) 453  Result(s) reported on 12 Feb 2014 at 12:11PM.  MCV 88  MCH 27.4  MCHC 31.1  RDW 17.0   RADIOLOGY:  Radiology Results: XRay:    06-Feb-15 12:45, Chest PA and Lateral  Chest PA and Lateral  REASON FOR EXAM:    sob  COMMENTS:       PROCEDURE: DXR - DXR CHEST PA (OR AP) AND LATERAL  - Jan 20 2014 12:45PM     CLINICAL DATA:  Dyspnea and rib pain and stomach pain    EXAM:  CHEST  2 VIEW    COMPARISON:  None.    FINDINGS:  The lungs are borderline hypoinflated. There is a small amount of  pleural fluid on the right. There is no pneumothorax. The cardiac  silhouette is enlarged. The pulmonary vascularity is not engorged.  The trachea is midline. The mediastinum is normal in width. There is  mild tortuosity of the descending thoracic aorta. There are loops of  mildly distended gas-filled bowel in the upper abdomen. The observed  portions of the ribs appear normal.     IMPRESSION:  1. There small right pleural effusion. Neither lung exhibits  evidence of pneumonia.  2. There is mild enlargement of the cardiac silhouette without  pulmonary vascular  congestion.  3. The observed portions of the bony thorax exhibit no acute  abnormalities.  4. There is mild gaseous distention of bowel loops in the upper  abdomen.  Electronically Signed    By: David  Martinique    On: 01/20/2014 12:49         Verified By: DAVID A. [place], M.D., MD    12-Feb-15 09:16, Chest Portable Single View  Chest Portable Single View  REASON FOR EXAM:    cp  COMMENTS:       PROCEDURE: DXR - DXR PORTABLE CHEST SINGLE VIEW  - Jan 26 2014  9:16AM     CLINICAL DATA:  Chest pain    EXAM:  PORTABLE CHEST - 1 VIEW    COMPARISON:  DG CHEST 2V dated 01/20/2014    FINDINGS:  Large right pleural effusion obscures the vast majority of the right  hemithorax. A portion of the right upper lobe is still aerated.  The left lung appears clear.  Right heart border obscured.     IMPRESSION:  1. Large right pleural effusion fills most of the right hemithorax.      Electronically Signed    By: Sherryl Barters M.D.    On: 01/26/2014 09:18         Verified By: Carron Curie, M.D.,    16-Feb-15 16:19, Chest Portable Single View  Chest Portable Single View  REASON FOR EXAM:    assess for pleural effusion  COMMENTS:       PROCEDURE: DXR - DXR PORTABLE CHEST SINGLE VIEW  - Jan 30 2014  4:19PM     CLINICAL DATA:  Assess pleural effusion    EXAM:  PORTABLE CHEST - 1 VIEW    COMPARISON:  01/26/2014    FINDINGS:  There is been interval placement of multiple chest tubes on the  right. No pneumothorax is seen. The pleural effusion has reduced  significantly with only a small amount within the minor fissure.  Mild bibasilar atelectatic changes are noted. Thecardiac shadow  remains enlarged.     IMPRESSION:  Multiple new chest tubes on the right. No pneumothorax is noted.  Only minimal residual fluid is seen.    Mild bibasilar atelectatic changes.      Electronically Signed    By: Inez Catalina M.D.    On: 01/30/2014 16:23     Verified By: Everlene Farrier,  M.D.,    17-Feb-15 09:10, Chest Portable Single View  Chest Portable Single View  REASON FOR EXAM:    assess for pleural effusion  COMMENTS:       PROCEDURE: DXR - DXR PORTABLE CHEST SINGLE VIEW  - Jan 31 2014  9:10AM     CLINICAL DATA:  Postop evaluation    EXAM:  PORTABLE CHEST - 1 VIEW    COMPARISON:  DG CHEST 1V PORT dated 01/30/2014    FINDINGS:  Low lung volumes. Chest tubes appreciated projecting in the right  lung apex, and right lung base. There is no evidence of an  appreciable pneumothorax. There is trace blunting of the right  costophrenic angle. The cardiac silhouette is enlarged. Minimal  increased density projects within the right lung base with linear  configuration. The osseous structures are unremarkable.     IMPRESSION:  Likely atelectasis and postsurgical changes within the right lung  base. Minimal blunting of the right costophrenic angle may reflect  small effusion. No evidence of appreciable pneumothorax.      Electronically Signed    By: Margaree Mackintosh M.D.    On: 01/31/2014 09:20     Verified By: Mikki Santee, M.D., MD    19-Feb-15 09:02, Chest PA and Lateral  Chest PA and Lateral  REASON FOR EXAM:    assess for pleural effusion  COMMENTS:       PROCEDURE: DXR - DXR CHEST PA (OR AP) AND LATERAL  - Feb 02 2014  9:02AM     CLINICAL DATA:  Chest tube placement    EXAM:  CHEST  2 VIEW    COMPARISON:  01/31/2014    FINDINGS:  Right chest tubes unchanged in position. No diagnostic pneumothorax.  Cardiomediastinal silhouette is stable. Again noted right basilar  atelectasis or scarring. Left lung is clear. No pulmonary edema.     IMPRESSION:  Stable right chest tubes position. No diagnostic pneumothorax. Again  noted right basilar atelectasis or scarring. Left lung is clear.      Electronically Signed    By: Lahoma Crocker M.D.    On: 02/02/2014 09:04         Verified By: Ephraim Hamburger, M.D.,    21-Feb-15 16:25, Chest 1 View AP or PA   Chest 1 View AP or PA  REASON FOR EXAM:    f/u ptx  COMMENTS:       PROCEDURE: DXR - DXR CHEST 1 VIEWAP OR PA  - Feb 04 2014  4:25PM     CLINICAL DATA:  Follow-up right pneumothorax with chest tubes in  place.    EXAM:  PORTABLE CHEST - 1 VIEW    COMPARISON:  DG CHEST 2V dated 02/02/2014; DG CHEST 1V PORT dated  01/31/2014; DG CHEST 1V PORT dated 01/30/2014; CT GUIDED TUBE  PLACEMENT dated 01/27/2014; DG CHEST POST THORACENTESIS dated  01/26/2014  FINDINGS:  Three right chest tubes in place with no pneumothorax. Minimal  residual right pleural effusion. Atelectasis at the right lung base,  unchanged. No new pulmonary parenchymal abnormality another lung.  Cardiac silhouette mildly enlarged but stable. Pulmonary vascularity  normal. Small amount of subcutaneous emphysemain  the right chest  wall.     IMPRESSION:  1. Three right chest tubes in place with no pneumothorax and very  little residual right pleural effusion.  2. Stable atelectasis at the right lung base.  No new abnormalities.      Electronically Signed    By: Evangeline Dakin M.D.    On: 02/04/2014 22:11         Verified By: Deniece Portela, M.D.,    23-Feb-15 08:31, Chest PA and Lateral  Chest PA and Lateral  REASON FOR EXAM:    assess for pleural effusion  COMMENTS:       PROCEDURE: DXR - DXR CHEST PA (OR AP) AND LATERAL  - Feb 06 2014  8:31AM     CLINICAL DATA:  Assess pleural effusion.    EXAM:  CHEST  2 VIEW    COMPARISON:  DG CHEST 1V dated 02/04/2014    FINDINGS:  Three large caliber chest tubes are in place posteriorly in the  upper, mid, and lower right hemi thorax and are unchanged in  position. Only a small amount of pleural fluid is present on the  right. A small right lower lateral pneumothorax is stable along the  posterior aspect of the seventh and eighth ribs. There is a small  amount of right axillary subcutaneous subcutaneous emphysema. The  left lung is adequately inflated. There is  no focal infiltrate nor  evidence of a left pleural effusion. The cardiac silhouette is  normal in size. The pulmonary vascularity is not engorged.     IMPRESSION:  1. There is a stable appearance of the small right inferior lateral  pneumothorax and very small right pleural effusion. The 3 chest  tubes appear unchanged in position.  2. There is minimal right perihilar subsegmental atelectasis which  is stable. The left lung is clear.  Electronically Signed    By: David  Martinique    On: 02/06/2014 08:35         Verified By: DAVID A. [place]ique, M.D.,MD    23-Feb-15 12:20, Chest PA and Lateral  Chest PA and Lateral  REASON FOR EXAM:    chest tube removal please do inspiratory film  COMMENTS:       PROCEDURE: DXR - DXR CHEST PA (OR AP) AND LATERAL  - Feb 06 2014 12:20PM     CLINICAL DATA:  Chest tube removal.    EXAM:  CHEST  2 VIEW    COMPARISON:  02/06/2014 at 8:28 a.m.    FINDINGS:  Right inferolateral pneumothorax is stable. Two of the 3 right chest  tubes has been removed. The chest tube with its tip along the  inferomedial right hemi thorax persists, and is unchanged in  position.    Mild lung base atelectasis is stable. No left pneumothorax or  pleural effusion. Cardiac silhouette is normal in size. Normal  mediastinal contours.     IMPRESSION:  1. Status post removal of 2 of the 3 right chest tubes. No change in  the small right inferolateral pneumothorax.      Electronically Signed    By: Lajean Manes M.D.    On: 02/06/2014 12:24     Verified By: Lasandra Beech, M.D.,    24-Feb-15 11:30, Chest PA and Lateral  Chest PA and Lateral  REASON FOR EXAM:    chest tube removal do inspiratory film please  COMMENTS:       PROCEDURE: DXR - DXR CHEST PA (OR AP) AND LATERAL  -  Feb 07 2014 11:30AM     CLINICAL DATA:  Chest tube removal    EXAM:  CHEST  2 VIEW    COMPARISON:  02/06/2014    FINDINGS:  Small right inferolateral pneumothorax is stable. Small  subcutaneous  emphysema right lower chest wall No acute infiltrate or pulmonary  edema. Right chest tube has been removed. Left lung is clear.  Elevation of the right hemidiaphragm again noted. Bony thorax is  stable.     IMPRESSION:  Stable small right inferolateral pneumothorax. Small subcutaneous  emphysema right lower chest wall. Right chest tube has been removed.  Left lung is clear.      Electronically Signed    By: Eston Esters.D.    On: 02/07/2014 11:34       Verified By: Ephraim Hamburger, M.D.,    27-Feb-15 12:23, Chest PA and Lateral  Chest PA and Lateral  REASON FOR EXAM:    Post chest tube removal  COMMENTS:       PROCEDURE: DXR - DXR CHEST PA (OR AP) AND LATERAL  - Feb 10 2014 12:23PM     CLINICAL DATA:  Chest tube removal    EXAM:  CHEST  2 VIEW    COMPARISON:  02/07/2014    FINDINGS:  Right basilar hydro pneumothorax is slightly smaller. No apical  component to the pneumothorax.  Mild right lower lobe atelectasis unchanged. Left lung remains  clear. Negative for heart failure.     IMPRESSION:  Right basilar hydro pneumothorax is slightly smaller.    Mild right lower lobe atelectasis unchanged.      Electronically Signed    By: Franchot Gallo M.D.    On: 02/10/2014 13:07         Verified By: Truett Perna, M.D.,    01-Mar-15 12:56, Chest Portable Single View  Chest Portable Single View  REASON FOR EXAM:    gi bleed, recent R lung surgery. eval free air  COMMENTS:       PROCEDURE: DXR - DXR PORTABLE CHEST SINGLE VIEW  - Feb 12 2014 12:56PM     CLINICAL DATA:  Follow-up pneumothorax    EXAM:  PORTABLE CHEST - 1 VIEW    COMPARISON:  None.    FINDINGS:  Cardiomediastinal silhouette is stable. Continuous improvement of  small right basilar hydro pneumothorax. Minimal residual atelectasis  or scarring right base. Left lung is clear. No pulmonary edema     IMPRESSION:  Continuous improvement of small right basilar hydro pneumothorax.  Minimal  residual atelectasis or scarring right base. Left lung is  clear. No pulmonary edema      Electronically Signed    By: Lahoma Crocker M.D.    On: 02/12/2014 13:00         Verified By: Ephraim Hamburger, M.D.,  [place]:    12-Feb-15 11:31, US Abdomen General Survey  US Abdomen General Survey  REASON FOR EXAM:    enlarged liver, hx kidney ca, increasing abd size lg   pleural ewffusiono  COMMENTS:   May transport without cardiac monitor    PROCEDURE: [place]  - US ABDOMEN GENERAL SURVEY  - Jan 26 2014 11:31AM     CLINICAL DATA:  Abdominal distention.    EXAM:  ULTRASOUND ABDOMEN COMPLETE    COMPARISON:  CT abdomen and pelvis 01/26/2012.    FINDINGS:  Gallbladder:  Removed.    Common bile duct:    Diameter: 0.6 cm.    Liver:  The liver appears enlarged measuring 20.5 cm but no focal lesion or  intrahepatic biliary ductal dilatation is identified. Right pleural  effusion is identified.    IVC:    No abnormality visualized.  Pancreas:    Visualized portion unremarkable.    Spleen:    Size and appearance within normal limits.    Right Kidney:    Length: 14.0 cm. No stone or hydronephrosis. A 1.9 cm simple cyst is  identified    Left Kidney:  Removed.    Abdominal aorta:    No aneurysm visualized.    Other findings:    None.     IMPRESSION:  No acute finding in the abdomen.    Right pleural effusion.  Hepatomegaly without focal lesion or biliary ductal dilatation.    Status post left nephrectomy and cholecystectomy.      Electronically Signed    By: Inge Rise M.D.    On: 01/26/2014 11:38         Verified By: Ramond Dial, M.D.,    12-Feb-15 15:06, US Guide Thoracentesis Right  US Guide Thoracentesis Right  REASON FOR EXAM:    lg new pleural effusion, fever, dyspnea  COMMENTS:       PROCEDURE: [place]  - US GUIDED THORACENTESIS RIGHT  - Jan 26 2014  3:06PM     CLINICAL DATA:  Large right pleural effusion.    EXAM:  ULTRASOUND GUIDED RIGHT  THORACENTESIS    COMPARISON:  US ABDOMEN COMPLETE dated 01/26/2014; DG CHEST POST  BIOPSY - THORACENTESIS dated 01/26/2014    PROCEDURE:  An ultrasound guided thoracentesis was thoroughly discussed with the  patient and questions answered. The benefits, risks, alternatives  and complications were also discussed. The patient understands and  wishes to proceed with the procedure. Written consent was obtained.    Ultrasound was performed to localize and mark an adequate pocket of  fluid in the right chest. The area was then prepped and draped in  the normal sterile fashion. 1% Lidocaine was used for local  anesthesia. Under ultrasound guidance a 6 French Safe-T-Centesis  catheter was introduced. Thoracentesis was performed. The catheter  was removed and a dressing applied.    Complications:  None.    FINDINGS:  A total of approximately 2.8 L of amber colored fluid was removed. A  fluid sample was sent for laboratory analysis.     IMPRESSION:  Successful ultrasound guided right thoracentesis yielding 2.8 L of  pleural fluid.      Electronically Signed    By: Aletta Edouard M.D.    On: 01/26/2014 15:49         Verified By: Azzie Roup, M.D.,    14-Feb-15 16:48, [place] Color Flow Doppler Low Extrem Bilat (Legs)  [place] Color Flow Doppler Low Extrem Bilat (Legs)  REASON FOR EXAM:    PE. DVT?  COMMENTS:       PROCEDURE: [place]  - US DOPPLER LOW EXTR BILATERAL  - Jan 28 2014  4:48PM     CLINICAL DATA:  Pulmonary embolism, evaluate for DVT    EXAM:  BILATERAL LOWER EXTREMITY VENOUS DOPPLER ULTRASOUND    TECHNIQUE:  Gray-scale sonography with graded compression, as well as color  Doppler and duplex ultrasound, were performed to evaluate the deep  venous system from the level of the common femoral vein through the  popliteal and proximal calf veins. Spectral Doppler was utilized to  evaluate flow at rest and with distal augmentation maneuvers.  COMPARISON:   None.    FINDINGS:  Thrombus within deep veins: There is no evidence of DVT within the  right lower extremity.    There is a minimal amount of hypoechoicnear occlusive thrombus  within the left posterior tibial vein (image 68). There is no  extension of this apparent tibial DVT into the popliteal vein or  more proximal aspect deep venous system of the left lower extremity.    Additionally, there is nonocclusive thrombus noted within several  prominent left calf varicosities (images 71, 72, 73 and 75).   IMPRESSION:  1. Examination is positive for DVT involving the left posterior  tibial vein. There is no extension of this tibial DVT into the  popliteal vein or more proximal aspect of the deep venous system of  the left lower extremity.  2. Superficial thrombophlebitis involving a prominent varicosity of  the left calf.  3. No evidence of DVT or superficial thrombophlebitis within the  right lower extremity.      Electronically Signed    By: Sandi Mariscal M.D.    On: 01/28/2014 16:56     Verified By: Aileen Fass, M.D.,  [place]:    06-Feb-15 12:45, Chest PA and Lateral  PACS Image    12-Feb-15 09:16, Chest Portable Single View  PACS Image    12-Feb-15 11:31, US Abdomen General Survey  PACS Image    12-Feb-15 14:22, Body Fluid Nucleated Cell Count, Diff  Result Interpretation  Pleural fluid smear contains mixed inflammation, negative for  malignancy. T.Rubinas MD.    12-Feb-15 14:37, Chest 1 View for Post Thoracentesis  PACS Image    12-Feb-15 15:06, US Guide Thoracentesis Right  PACS Image    13-Feb-15 16:34, CT Guided Tube Placement  PACS Image    13-Feb-15 19:57, CT PE Chest/Abd Pelvis W  PACS Image    14-Feb-15 16:48, [place] Color Flow Doppler Low Extrem Bilat (Legs)  PACS Image    16-Feb-15 16:19, Chest Portable Single View  PACS Image    17-Feb-15 09:10, Chest Portable Single View  PACS Image    19-Feb-15 09:02, Chest PA and Lateral  PACS Image    21-Feb-15  16:25, Chest 1 View AP or PA  PACS Image    23-Feb-15 08:31, Chest PA and Lateral  PACS Image    23-Feb-15 12:20, Chest PA and Lateral  PACS Image    24-Feb-15 11:30, Chest PA and Lateral  PACS Image    27-Feb-15 12:23, Chest PA and Lateral  PACS Image    01-Mar-15 12:56, Chest Portable Single View  PACS Image  CT:    13-Feb-15 16:34, CT Guided Tube Placement  CT Guided Tube Placement  REASON FOR EXAM:    right pleural effusion-pigtail catheter to Pleuravac  COMMENTS:       PROCEDURE: CT  - CT GUIDED TUBE PLACEMENT  - Jan 27 2014  4:34PM     CLINICAL DATA:  Right pleural effusion    EXAM:  CT GUIDE TUBE PLACEMENT    MEDICATIONS ANDMEDICAL HISTORY:  Versed 2.5 mg, Fentanyl 75 mcg.    Additional Medications: None.  ANESTHESIA/SEDATION:  Moderate sedation time: 35 minutes    CONTRAST:  Nine    PROCEDURE:  The procedure, risks, benefits, and alternatives were explained to  the patient. Questions regarding the procedure were encouraged and  answered. The patient understands and consents to the procedure.    The posterior lateral right lower chest wall was prepped with  chlorhexidine in a sterile fashion, and  a sterile drape wasapplied  covering the operative field. A sterile gown and sterile gloves were  used for the procedure.  COMPLICATIONS:  None    FINDINGS:  Utilizing CT guidance a proper entry site for chest tube placement  was established. The entry site was anesthetized with 6 ml 1%  lidocaine without epinephrine. A small dermatotomy was formed at the  injury site. Access was obtained within the pleural space with a 21  gauge 15 cm Chiba biopsy needle. Needle placement was performed  utilizing CT guidance. Access was maintained with a 0.018 guide  wire. The entry needle was removed. Access was then obtained with a  6 [place] micro catheter. The inner stiffener was removed. Catheter  placement was performed utilizing CT guidance over the 0.018  guidewire.  Access was then obtained with an Amplatz guidewire. The  micro catheter was removed over the guidewire. An 8 [place] dilator  was used to dilate the tract. The right pleural space was accessed  with a 10 [place] multipurpose drainage catheter. Catheter placement  was performed over the Amplatz guidewire utilizing CT guidance.  Appropriate placement was established. Amplatz guidewire was  removed. The inner stiffener was removed over the guidewire prior to  wire removal. The catheter was coped within the right lung base. 500  mL of amber colored fluid was removed from the pleural space.  Residual fluid was appreciated within the pleural space as well as a  small component of air postprocedural. The patient tolerated the  procedure without complications and was monitored in the structures  recovery suite for approximately 30 min. The patient was returned to  the floor for monitoring an continued orders per the attending  physician.     IMPRESSION:  CT-guided right chest tube placement. Patient tolerated the  procedure without complications.      Electronically Signed    By: Margaree Mackintosh M.D.    On: 01/27/2014 16:54         Verified By: Mikki Santee, M.D., MD    13-Feb-15 19:57, CT PE Chest/Abd Pelvis W  CT PE Chest/Abd Pelvis W  REASON FOR EXAM:    SOB. h/o cancer, Hx of RCC. reoccurence ?  COMMENTS:       PROCEDURE: CT  - CT PE CHEST/ ABD/PELVIS WITH  - Jan 27 2014  7:57PM     CLINICAL DATA:  Short of breath, history of renal cell carcinoma and  gastric bypass surgery. Evaluate for cancer recurrence    EXAM:  CT CHEST, ABDOMEN, AND PELVIS WITH CONTRAST    TECHNIQUE:  Multidetector CT imaging of the chest, abdomen and pelvis was  performed following the standard protocol during bolus  administration of intravenous contrast.  CONTRAST:  100 mL Isovue 370    COMPARISON:  CT-guided chest tube placement earlier today; prior CT  abdomen 01/26/2012 rib    FINDINGS:  CT  CHEST FINDINGS    Mediastinum: Unremarkable CT appearance of the thyroid gland. No  suspicious mediastinal or hilar adenopathy. No soft tissue  mediastinal mass. The thoracic esophagus is unremarkable.    Heart/Vascular: Excellent opacification of the pulmonary arteries to  the proximal subsegmental level. Focal irregular wall adherent  eccentric thrombus noted in the lower lobe segmental pulmonary  arteries. There is associated atelectasis in this lungs segment. No  additional central filling defects identified. The heart is within  normal limits for size. Atherosclerotic calcifications noted  throughout the coronary arteries. No significant pericardial  effusion. Conventional 3 vessel  aortic arch. No aneurysmal  dilatation or acute dissection.    Lungs/Pleura: Pigtail thoracostomy tube noted in the inferior right  pleural space. There is a persistent moderate right hydro  pneumothorax. Dense atelectasis of the right lower lobe. There is  some irregularity of the supplying bronchi suggesting focal  bronchiectasis with a beaded appearance. Locules air and relative  hypoattenuation in the medial in the inferior most aspect of the  right lower lobe raise concern for a possible superimposed  necrotizing pneumonia. There is a large loculated subpulmonic  component to the residual right pleural effusion. This is unlikely  to drain through the percutaneous drain.    Bones/Soft Tissues: No acute fracture or aggressive appearing lytic  or blastic osseous lesion.    CT ABDOMEN AND PELVIS FINDINGS    Abdomen: Surgical changes of prior gastric bypass procedure. The  anastomoses are patent. Thereis some thickening and stranding  around the gastroenteric anastomosis in the left upper quadrant.  Similar findings were seen on prior imaging but are more prominent  on today's examination. There is no evidence of a bowel obstruction.  UnremarkableCT appearance of the spleen, right adrenal adenoma  or  pancreas. Normal hepatic contour and morphology. The gallbladder is  surgically absent. There is mild intra hepatic biliary ductal  dilatation. No significant extrahepatic biliary ductal dilatation.    Surgical changes of prior left nephrectomy. No evidence of enhancing  soft tissue in the nephrectomy bed. No suspicious periaortic  adenopathy. Stable right renal cortical cyst. No enhancing renal  mass or hydronephrosis.    Highly redundant colon. No evidence of obstruction or focal bowel  wall thickening. Normal appendix in the right lower quadrant. The  terminal ileum is unremarkable. No free fluid or suspicious  adenopathy.    Pelvis: Unremarkable bladder, prostate gland and seminal vesicles.  No free fluid or suspicious adenopathy.  Bones/Soft Tissues: No acute fracture or aggressive appearing lytic  or blastic osseous lesion. Extensive multilevel degenerative changes  throughout the lumbar spine.    Vascular: Atherosclerotic vascular disease without significant  stenosis or aneurysmal dilatation.     IMPRESSION:  CT PE STUDY    1. 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 No definite acute PE identified.  2. Interval developmentof findings suspicious for a combination of  necrotizing pneumonia with acute subtotal lobar atelectasis and  developing bronchiectasis involving the right lower lobe.  3. Loculated right hydro pneumothorax with a percutaneous pigtail  drain in place. Of note, there is a large subpulmonic loculation  which is unlikely to drain percutaneously. Consider consultation  with thoracic surgery for possible VATS/decortication.  4. Atherosclerosis with multivessel coronary artery disease.  CT ABD/PELVIS    1. Surgical changes of prior left nephrectomy. No evidence of  locally recurrent or metastatic renal cell carcinoma.  2. Slightly increased conspicuity of stranding and haziness around  the  gastroenteric anastomosis at the gastric bypass surgical site.  This is in comparison to prior imaging from February of 2013. This  may simply represent evolution of postsurgical change. However, if  the patient has upper abdominal pain then local  infection/inflammation at the surgical anastomosis is a possibility.  3. Mild intrahepatic biliary ductal dilatation in this patient with  surgical changes of prior cholecystectomy.  4. Scattered atherosclerotic vascular disease.  Critical Value/emergent results were called by telephone at the time  of interpretation on 01/27/2014 at 9:54 PM to Dr. Lavetta Nielsen, who verbally  acknowledged these  results.      Electronically Signed    By: Jacqulynn Cadet M.D.    On: 01/27/2014 21:54         Verified By: Criselda Peaches, M.D.,   ASSESSMENT AND PLAN:  Assessment/Admission Diagnosis GI bleed with history of PUD recent DVT/PE diagnosis empyema   Plan with the Gi bleed, anticoagulation will need to be stopped and he would be recommended to have an IVC filter placed.  Risks and benefits discussed and he is agreeable to proceed.  Will plan placement tomorrow am.   level 4   Electronic Signatures: Algernon Huxley (MD)  (Signed 01-Mar-15 15:41)  Authored: Chief Complaint and History, PAST MEDICAL/SURGICAL HISTORY, ALLERGIES, HOME MEDICATIONS, Family and Social History, Review of Systems, Physical Exam, LABS, RADIOLOGY, Assessment and Plan   Last Updated: 01-Mar-15 15:41 by Algernon Huxley (MD)

## 2015-04-07 NOTE — H&P (Signed)
PATIENT NAME:  [name], [name] MR#:  [id number] DATE OF BIRTH:  [birth year]  DATE OF ADMISSION:  06/22/2014  PRIMARY CARE PHYSICIAN: Dr. Derinda Late.    REFERRING PHYSICIAN: Delene Loll, PA in the Emergency Department.   CHIEF COMPLAINT: Generalized body aches, nausea, diarrhea.   HISTORY OF PRESENT ILLNESS: [name] is a 69 year old, pleasant, white male with past medical history of DVT, PE, diabetes mellitus, hypertension. Comes to the Emergency Department with complaints of feeling dizzy, headache, runny nose, diarrhea, nausea and some cough since Sunday. Noted to have fever of 101.3. The patient's symptoms were getting gradually worse to the point that unable to walk. Concerning this, came to the Emergency Department. Workup in the Emergency Department: Chest x-ray and UA do not show any obvious signs of infection. He is found to have elevated white blood cell count of 15,000 with a left shift. The patient also complains of shortness of breath without any exertion. Unable to eat for the last 2 days secondary to decreased appetite and some nausea. Denies having any sick contacts. Denies having any recent travel. The patient states having soft stools.   PAST MEDICAL HISTORY:  1. Peptic ulcer disease.  2. History of kidney cancer.  3. History of DVT and PA.  4. Gastroesophageal reflux disease.  5. Right lower lobe pneumonia complicated by empyema.   PAST SURGICAL HISTORY:  1. Left nephrectomy due to kidney cancer.  2. Gastric bypass surgery.  3. Cholecystectomy.  4. Video-assisted thoracic surgery with decortication on February 16th with a chest tube.   ALLERGIES: No known drug allergies.   HOME MEDICATIONS:  1. Sucralfate 1 g 4 times a day.  2. Protonix 40 mg 2 times a day.  3. Paroxetine 40 mg daily.  4. Multivitamin 1 tablet once a day.  5. Glyburide 5 mg once a day.  6. Ferrex 150 mg 2 times a day.  7. Enalapril 10 mg once a day.  8. Plavix 75 mg once a day.   SOCIAL  HISTORY: Former smoker, quit about 6 months back. Denies drinking alcohol or using illicit drugs. Works as a Hotel manager. Lives with his wife.   FAMILY HISTORY: Positive for MI in his father. Mother had lung cancer.   REVIEW OF SYSTEMS:  CONSTITUTIONAL: Experiencing severe generalized weakness.  EYES: No change in vision.  ENT: No change in hearing.  RESPIRATORY: No cough. Has shortness of breath.  CARDIOVASCULAR: No chest pain.  GASTROINTESTINAL: Has nausea and diarrhea. No abdominal pain.  GENITOURINARY: No dysuria or hematuria.  ENDOCRINE: Diagnosis of diabetes mellitus.  HEMATOLOGIC: No easy bruising or bleeding.  SKIN: No rash or lesions.  MUSCULOSKELETAL: Generalized body aches.  NEUROLOGIC: No  numbness in any part of the body.   PHYSICAL EXAMINATION:  GENERAL: This is a well-built, well-nourished, age-appropriate male lying down in the bed, not in distress.  VITAL SIGNS: Temperature 98.3, pulse 91, blood pressure 138/75, respiratory rate of 16, oxygen saturation is 97% on room air.  HEENT: Head normocephalic, atraumatic. There is no scleral icterus. Conjunctivae normal. Pupils equal and react to light. Extraocular movements are intact. Mucous membranes moist. No pharyngeal erythema.   NECK: Supple. No lymphadenopathy. No JVD. No carotid bruit. No thyromegaly.  CHEST: Has no focal tenderness. Crackles are heard in the left lower lobe and also in the right lower lobe, left more than the right.  HEART: S1, S2, regular, tachycardia.  ABDOMEN: Bowel sounds present. Soft, nontender, nondistended. Obese abdomen. Could not appreciate any hepatosplenomegaly.  EXTREMITIES: No pedal edema. Pulses 2+.  NEUROLOGIC: The patient is alert, oriented to place, person and time. Cranial nerves II through XII intact. Motor 5/5 in upper and lower extremities.   LABORATORIES: UA negative for nitrites and leukocyte esterase. Lactic acid of 1.7. Chest x-ray, 1 view portable: No acute cardiopulmonary  disease. CMP is completely within normal limits.   CBC: WBC of 15,000, hemoglobin 11.5, platelet count of 188, neutrophils of 88%.   ASSESSMENT AND PLAN: [name] is a 69 year old male with a previous history of pneumonia and empyema requiring chest tube placement who comes to the Emergency Department with generalized body aches, fever, elevated white blood cell count.  1. Sepsis: Still concerned about the possible infection in the left lung. Chest x-ray does not show any obvious pneumonia. The patient has some nausea and diarrhea; however, the abdomen is soft. Will treat it as a community-acquired pneumonia with vancomycin. Continue with intravenous fluids. The other possibility is that patient might be having a viral syndrome. Will repeat the CBC in the morning.  2. Diabetes mellitus: Continue with glipizide and sliding scale insulin.  3. Debility: Will involve physical therapy.  4. Keep the patient on deep vein thrombosis prophylaxis with Lovenox.   TIME SPENT: 50 minutes.    ____________________________ Monica Becton, MD pv:gb D: 06/22/2014 23:34:38 ET T: 06/22/2014 23:55:04 ET JOB#: [id number]  cc: Monica Becton, MD, <Dictator> Derinda Late, MD Monica Becton MD ELECTRONICALLY SIGNED 06/23/2014 21:16

## 2015-04-07 NOTE — Op Note (Signed)
PATIENT NAME:  [name], [name] MR#:  [id number] DATE OF BIRTH:  22-Mar-1946  DATE OF PROCEDURE:  01/30/2014  SURGEON: Christia Reading E. Genevive Bi, M.D.   ASSISTANT: Chauncey Reading, M.D.  PREOPERATIVE DIAGNOSIS: Loculated parapneumonic effusion.   POSTOPERATIVE DIAGNOSIS: Loculated parapneumonic effusion.   OPERATION PERFORMED: 1.  Preoperative bronchoscopy to assess endobronchial anatomy.  2.  Right thoracoscopy with decortication of visceral and parietal pleura.   INDICATIONS FOR PROCEDURE: [name] is a 69 year old gentleman with a recent history of a pulmonary embolism and a large right-sided pleural effusion that failed to respond to percutaneous drainage. The indications and risks of the above-named procedures were given to the patient, who gave his informed consent.   DESCRIPTION OF PROCEDURE: The patient was brought to the operating suite and placed in the supine position. General endotracheal anesthesia was given with a double-lumen tube. Preoperative bronchoscopy was carried out. The scope was introduced in all the major airways, and there was no evidence of any purulent secretion, tumor, or blood. The double-lumen tube was positioned appropriately, and then the patient was turned for a right thoracoscopy. The previous chest tube was removed. The patient was prepped and draped in the usual sterile fashion. We began by making a single port site in the anterior axillary line at approximately the fifth intercostal space. The pleural space was entered. There was a large amount of fluid present within the pleural space, which was suctioned. A port was placed through our incision, and thoracoscopy was carried out. We could see that there were extensive pleural adhesions with somewhat thickened fibrinous exudate. Two additional port sites were created once we could free up enough of the lung off the chest wall to see. Through these 3 working incisions, we were able to do a complete decortication. We  found that the middle and lower lobes were most severely affected. We were able to get into proper plane, and using a long Valora Corporal were able to elevate the peel off the middle and lower lobes. The upper lobe was taken down off the chest wall, but had little peel on that particular area. Decortication of parietal pleura was then performed. Three chest tubes were inserted, most of them anteriorly and going through our 2 most anterior port sites, as well as a new site. These consisted of 32 and 28 Blake drains positioned appropriately to drain the entire pleural surface. There was no evidence of any bleeding from the port sites. Appropriate aliquots were sent to the pleural fluid and pleural peel for cytology as well as pathology and microbiology. There was no evidence of any purulence within the pleural space. There was no sign of any frank malignancy. In addition, the lung itself appeared grossly within normal limits once it was decorticated. The tubes were secured to the skin with #1 silk. All the port sites were then closed with 0 Vicryl on the muscles, 2-0 Vicryl on the subcutaneous tissues, and nylon on the skin. The patient tolerated the procedure well, and was rolled in the supine position, where he was extubated and then taken to the recovery room in stable condition.    ____________________________ Lew Dawes Genevive Bi, MD teo:mr D: 01/30/2014 19:22:16 ET T: 01/30/2014 20:08:17 ET JOB#: [id number]  cc: Christia Reading E. Genevive Bi, MD, <Dictator> Louis Matte MD ELECTRONICALLY SIGNED 02/12/2014 19:42

## 2015-04-07 NOTE — Op Note (Signed)
PATIENT NAME:  [name], [name] MR#:  [id number] DATE OF BIRTH:  04-18-1946  DATE OF PROCEDURE:  02/13/2014  PREOPERATIVE DIAGNOSES: 1.  Recent deep venous thrombosis and pulmonary embolus.  2.  Gastrointestinal bleeding with anticoagulation and history of peptic ulcer disease.   POSTOPERATIVE DIAGNOSES: 1.  Recent deep venous thrombosis and pulmonary embolus.  2.  Gastrointestinal bleeding with anticoagulation and history of peptic ulcer disease.   PROCEDURE:  1.  Ultrasound guidance for vascular access, right femoral vein.  2.  Catheter placement into vena cava from right femoral venous approach.  3.  Inferior venacavogram.  4.  Placement of a Bard Meridian IVC filter.   SURGEON: Algernon Huxley, M.D.   ANESTHESIA: Local with moderate conscious sedation.   ESTIMATED BLOOD LOSS: Minimal   FLUOROSCOPY TIME: Approximately 1 minute.   CONTRAST USED: 30 mL.   INDICATION FOR PROCEDURE: This is 70 year old white male with a history of peptic ulcer disease. About a month ago he had a DVT and pulmonary embolus, was started on anticoagulation. He returned to the hospital yesterday with anemia and upper GI bleeding and his anticoagulation will need to be stopped. For this reason, an IVC filter will be placed.   DESCRIPTION OF PROCEDURE: The patient was brought to the vascular suite. Groins were shaved and prepped and a sterile surgical field was created. The femoral vein was visualized on ultrasound and found to be patent. It was then accessed under direct ultrasound guidance without difficulty with a Seldinger needle. A J-wire was then placed. After skin nick and dilatation, the delivery sheath was placed into the inferior vena cava and an inferior venacavogram was performed. The level of the renal veins was found to be approximately the top of L1. The filter was deployed at the top of L2. The delivery sheath was then removed. Pressure was held on the groin. Sterile dressing was placed. The patient  tolerated the procedure well and was taken to the recovery room in stable condition.   ____________________________ Algernon Huxley, MD jsd:cs D: 02/13/2014 08:33:00 ET T: 02/13/2014 20:12:41 ET JOB#: [id number]  cc: Algernon Huxley, MD, <Dictator> Algernon Huxley MD ELECTRONICALLY SIGNED 02/15/2014 14:39

## 2015-04-07 NOTE — Discharge Summary (Signed)
Dates of Admission and Diagnosis:  Date of Admission 22-Jun-2014   Date of Discharge 25-Jun-2014   Admitting Diagnosis sepsis   Final Diagnosis Sepsis Pneumonia DM DVT, Hx of GI bleed and ulcers- stopped Abx.    Chief Complaint/History of Present Illness a 69 year old, pleasant, white male with past medical history of DVT, PE, diabetes mellitus, hypertension. Comes to the Emergency Department with complaints of feeling dizzy, headache, runny nose, diarrhea, nausea and some cough since Sunday. Noted to have fever of 101.3. The patient's symptoms were getting gradually worse to the point that unable to walk. Concerning this, came to the Emergency Department. Workup in the Emergency Department: Chest x-ray and UA do not show any obvious signs of infection. He is found to have elevated white blood cell count of 15,000 with a left shift. The patient also complains of shortness of breath without any exertion. Unable to eat for the last 2 days secondary to decreased appetite and some nausea. Denies having any sick contacts. Denies having any recent travel. The patient states having soft stools.   Allergies:  No Known Allergies:   Thyroid:  11-Jul-15 05:38   Thyroid Stimulating Hormone 1.29 (0.45-4.50 (International Unit)  ----------------------- Pregnant patients have  different reference  ranges for TSH:  - - - - - - - - - -  Pregnant, first trimetser:  0.36 - 2.50 uIU/mL)  Hepatic:  09-Jul-15 19:06   Bilirubin, Total 0.4  Alkaline Phosphatase 104 (45-117 NOTE: New Reference Range 11/04/13)  SGPT (ALT) 18  SGOT (AST)  10  Total Protein, Serum 6.9  Albumin, Serum  3.2  Routine Micro:  09-Jul-15 19:04   Micro Text Report BLOOD CULTURE   COMMENT                   NO GROWTH AEROBICALLY/ANAEROBICALLY IN 5 DAYS   ANTIBIOTIC                       Culture Comment NO GROWTH AEROBICALLY/ANAEROBICALLY IN 5 DAYS  Result(s) reported on 27 Jun 2014 at 07:00PM.    20:19   Micro Text  Report BLOOD CULTURE   COMMENT                   NO GROWTH AEROBICALLY/ANAEROBICALLY IN 5 DAYS   ANTIBIOTIC                       Culture Comment NO GROWTH AEROBICALLY/ANAEROBICALLY IN 5 DAYS  Result(s) reported on 27 Jun 2014 at 08:00PM.  10-Jul-15 15:12   Organism Name ESCHERICHIA COLI  Organism Quantity MODERATE GROWTH  Cefazolin Sensitivity S  Ampicillin Sensitivity S  Ceftriaxone Sensitivity S  Ciprofloxacin Sensitivity S  Gentamicin Sensitivity S  Ceftazidime Sensitivity S  Imipenem Sensitivity S  Levofloxacin Sensitivity S  Trimethoprim/Sulfamethoxazole Sensitivty S  Cefoxitin Sensitivity S  Micro Text Report SPUTUM CULTURE   ORGANISM 1                MODERATE GROWTH ESCHERICHIA COLI   GRAM STAIN                EXCELLENT SPECIMEN-90-100% WBC   GRAM STAIN                MANY WHITE BLOOD CELLS   GRAM STAIN                MODERATE GRAM NEGATIVE ROD   GRAM STAIN  FEW GRAM POSITIVE COCCI IN PAIRS RARE YEAST   ANTIBIOTIC                    ORG#1     AMPICILLIN                    S         CEFAZOLIN                     S         CEFOXITIN                     S         CEFTAZIDIME              S         CEFTRIAXONE                   S         CIPROFLOXACIN                 S         GENTAMICIN                    S         IMIPENEM                      S         LEVOFLOXACIN                  S         TRIMETHOPRIM/SULFAMETHOXAZOLE S  Organism 1 MODERATE GROWTH ESCHERICHIA COLI  Culture Comment ID TO FOLLOW SENSITIVITIES TO FOLLOW  Gram Stain 1 EXCELLENT SPECIMEN-90-100% WBC  Gram Stain 2 MANY WHITE BLOOD CELLS  Gram Stain 3 MODERATE GRAM NEGATIVE ROD  Gram Stain 4 FEW GRAM POSITIVE COCCI IN PAIRS RARE YEAST  Result(s) reported on 25 Jun 2014 at 12:05PM.  General Ref:  11-Jul-15 05:38   Vitamin D, 1,25 and 25-Hydroxy, Serum ========== TEST NAME ==========  ========= RESULTS =========  = REFERENCE RANGE =  VIT D, 1,25 & 25-HYDROXY  Vitamin D, 1,25 +  25-Hydroxy Calcitriol(1,25 di-OH Vit D)    [   76.6 pg/mL           ]         19.9-79.3               [facility]             No: [id number]           [address], [place], [place] [address]           Lindon Romp, MD         [phone]   Result(s) reported on 27 Jun 2014 at 05:13PM.  Routine Chem:  09-Jul-15 19:06   Glucose, Serum  200  BUN  28  Creatinine (comp)  1.37  Sodium, Serum 141  Potassium, Serum 3.6  Chloride, Serum  109  CO2, Serum 21  Calcium (Total), Serum  8.2  Anion Gap 11  Osmolality (calc) 292  eGFR (African American) >60  eGFR (Non-African American)  54 (eGFR values <81m/min/1.73 m2 may be an indication of chronic kidney disease (CKD). Calculated eGFR is useful in patients with stable renal function. The eGFR calculation will not be reliable in acutely ill patients when serum creatinine  is changing rapidly. It is not useful in  patients on dialysis. The eGFR calculation may not be applicable to patients at the low and high extremes of body sizes, pregnant women, and vegetarians.)  Magnesium, Serum  1.7 (1.8-2.4 THERAPEUTIC RANGE: 4-7 mg/dL TOXIC: > 10 mg/dL  -----------------------)  Phosphorus, Serum 2.7 (Result(s) reported on 22 Jun 2014 at 07:38PM.)  11-Jul-15 05:38   Glucose, Serum  145  BUN 14  Creatinine (comp) 1.13  Sodium, Serum 145  Potassium, Serum 3.6  Chloride, Serum  113  CO2, Serum 24  Calcium (Total), Serum  8.4  Anion Gap 8  Osmolality (calc) 292  eGFR (African American) >60  eGFR (Non-African American) >60 (eGFR values <31m/min/1.73 m2 may be an indication of chronic kidney disease (CKD). Calculated eGFR is useful in patients with stable renal function. The eGFR calculation will not be reliable in acutely ill patients when serum creatinine is changing rapidly. It is not useful in  patients on dialysis. The eGFR calculation may not be applicable to patients at the low and high extremes of body sizes,  pregnant women, and vegetarians.)  Cardiac:  09-Jul-15 19:06   Troponin I < 0.02 (0.00-0.05 0.05 ng/mL or less: NEGATIVE  Repeat testing in 3-6 hrs  if clinically indicated. >0.05 ng/mL: POTENTIAL  MYOCARDIAL INJURY. Repeat  testing in 3-6 hrs if  clinically indicated. NOTE: An increase or decrease  of 30% or more on serial  testing suggests a  clinically important change)  Routine Coag:  09-Jul-15 19:06   Prothrombin 13.7  INR 1.1 (INR reference interval applies to patients on anticoagulant therapy. A single INR therapeutic range for coumarins is not optimal for all indications; however, the suggested range for most indications is 2.0 - 3.0. Exceptions to the INR Reference Range may include: Prosthetic heart valves, acute myocardial infarction, prevention of myocardial infarction, and combinations of aspirin and anticoagulant. The need for a higher or lower target INR must be assessed individually. Reference: The Pharmacology and Management of the Vitamin K  antagonists: the seventh ACCP Conference on Antithrombotic and Thrombolytic Therapy. CYPPJK.9326Sept:126 (3suppl): [id number] A HCT value >55% may artifactually increase the PT.  In one study,  the increase was an average of 25%. Reference:  "Effect on Routine and Special Coagulation Testing Values of Citrate Anticoagulant Adjustment in Patients with High HCT Values." American Journal of Clinical Pathology 2006;126:400-405.)  Routine Hem:  09-Jul-15 19:06   WBC (CBC)  15.1  RBC (CBC)  3.94  Hemoglobin (CBC)  11.5  Hematocrit (CBC)  36.0  Platelet Count (CBC) 188  MCV 91  MCH 29.2  MCHC 32.0  RDW  14.7  Neutrophil % 88.5  Lymphocyte % 5.8  Monocyte % 4.5  Eosinophil % 0.6  Basophil % 0.6  Neutrophil #  13.3  Lymphocyte #  0.9  Monocyte # 0.7  Eosinophil # 0.1  Basophil # 0.1 (Result(s) reported on 22 Jun 2014 at 07:27PM.)  11-Jul-15 05:38   WBC (CBC) 8.0  RBC (CBC)  3.39  Hemoglobin (CBC)  10.2   Hematocrit (CBC)  31.3  Platelet Count (CBC) 157  MCV 92  MCH 30.0  MCHC 32.5  RDW 14.5  Neutrophil % 84.7  Lymphocyte % 7.6  Monocyte % 5.7  Eosinophil % 1.8  Basophil % 0.2  Neutrophil #  6.8  Lymphocyte #  0.6  Monocyte # 0.5  Eosinophil # 0.1  Basophil # 0.0 (Result(s) reported on 24 Jun 2014 at 06:20AM.)   PERTINENT RADIOLOGY STUDIES: XRay:  10-Jul-15 13:44, Chest PA and Lateral  Chest PA and Lateral   REASON FOR EXAM:    sob cough and sputum  COMMENTS:       PROCEDURE: DXR - DXR CHEST PA (OR AP) AND LATERAL  - Jun 23 2014  1:44PM     CLINICAL DATA:  Cough and difficulty breathing    EXAM:  CHEST  2 VIEW    COMPARISON:  May 23, 2014    FINDINGS:  There is atelectatic change in the right middle lobe region. Lungs  elsewhere clear. Heart size and pulmonary vascularity are normal. No  adenopathy. No bone lesions.     IMPRESSION:  Atelectatic change in the right middle lobe. No frank edema or  consolidation.      Electronically Signed    By: Lowella Grip M.D.    On: 06/23/2014 13:53         Verified By: Leafy Kindle. WOODRUFF, M.D.,   Pertinent Past History:  Pertinent Past History 1. Peptic ulcer disease.  2. History of kidney cancer.  3. History of DVT and PA.  4. Gastroesophageal reflux disease.  5. Right lower lobe pneumonia complicated by empyema.   Hospital Course:  Hospital Course a 69 year old male with a previous history of pneumonia and empyema requiring chest tube placement who comes to the Emergency Department with generalized body aches, fever, elevated white blood cell count.   *Sepsis: - pneumonia in the right lung on Chest x-ray, atelectasis and scar tissues.   Sputum cx- Gram negative rods- On broad spectrum Abx- responding well. -intravenous fluids. -wbc improved. -cont abx.   swich to oral augmentin-and may d/c today , if c diff is negative.   he follows with Dr. Raul Del in office- advised to follow next week- spoke to Dr.  Raul Del about him.  *Diarrhea   No fever, Likely Abx indused.  there was only one episode- waited to collwct stool sample for 16 hrs- no more episode- so d/c home.  *Diabetes mellitus: Continue with glipizide and sliding scale insulin.   * Debility: Will involve physical therapy.   *h/o PE: off anticoagulation due to GI bleed.  H/o Gi bleed ,. no s/s of acute b leeding   Condition on Discharge Stable   Code Status:  Code Status Full Code   DISCHARGE INSTRUCTIONS HOME MEDS:  Medication Reconciliation: Patient's Home Medications at Discharge:     Medication Instructions  paroxetine 20 mg oral tablet  2 tabs (20m) orally once a day   multivitamin  1 tab(s) orally once a day   ferrex-150 (as elemental iron) 150 mg oral capsule  1 cap(s) orally 2 times a day   sucralfate 1 g oral tablet  1 tab(s) orally 4 times a day (before meals and at bedtime)   protonix 40 mg oral delayed release tablet  1 tab(s) orally 2 times a day   enalapril 10 mg oral tablet  1 tab(s) orally once a day   glyburide 5 mg oral tablet  1 tab(s) orally once a day   clopidogrel 75 mg oral tablet  1 tab(s) orally once a day   amoxicillin-clavulanate 875 mg-125 mg oral tablet  1 tab(s) orally every 12 hours x 5 days     Physician's Instructions:  Diet Carbohydrate Controlled (ADA) Diet   Activity Limitations As tolerated   Return to Work Not Applicable   Time frame for Follow Up Appointment 1-2 days  Dr. FRaul Del   Other Comments follow in Dr. FGust Broomsoffice  in 3-4 days- Need to check final report of sputum cx while having this office visit.   Electronic Signatures: Vaughan Basta (MD)  (Signed 15-Jul-15 10:50)  Authored: ADMISSION DATE AND DIAGNOSIS, CHIEF COMPLAINT/HPI, Allergies, PERTINENT LABS, PERTINENT RADIOLOGY STUDIES, PERTINENT PAST HISTORY, HOSPITAL COURSE, DISCHARGE INSTRUCTIONS HOME MEDS, PATIENT INSTRUCTIONS   Last Updated: 15-Jul-15 10:50 by Vaughan Basta (MD)

## 2015-04-07 NOTE — Op Note (Signed)
PATIENT NAME:  [name], HUFNAGLE MR#:  [id number] DATE OF BIRTH:  [birth year]  DATE OF PROCEDURE:  10/18/2014  PREOPERATIVE DIAGNOSES: Deep venous thrombosis and pulmonary embolism, status post inferior vena cava filter.  POSTOPERATIVE DIAGNOSIS: Deep venous thrombosis and pulmonary embolism, status post inferior vena cava filter.   PROCEDURES:  1.  Ultrasound guidance for vascular access to right jugular vein.  2.  Catheter placement into inferior vena cava from right jugular approach.  3.  Inferior venacavogram.  4.  Removal of a Bard Denali inferior vena cava filter.   SURGEON: Algernon Huxley, MD   ANESTHESIA: Local with moderate conscious sedation.   ESTIMATED BLOOD LOSS: Minimal.   INDICATION FOR PROCEDURE: A 69 year old gentleman who had an IVC filter placed several months ago for DVT and PE. He now only has some mild chronic appearing thrombus residual, his postphlebitic symptoms are is not severe. He is maintained on clopidogrel which he will stay on. We discussed options including leaving the filter in place versus retrieval to avoid the small risk of major life-threatening complications from the IVC filter being in place. He desired a retrieval. Risks and benefits were discussed. Informed consent was obtained.   DESCRIPTION OF PROCEDURE: The patient is brought to the vascular suite. The right neck was sterilely prepped and draped and a sterile surgical field was created. The right jugular vein was visualized with ultrasound and found to be widely patent. It was then accessed under direct ultrasound guidance without difficulty with Seldinger needle. J-wire was then placed. After skin nick and dilatation, the retrieval sheath was placed over the wire into the inferior vena cava and the wire was removed. An inferior venacavogram was performed which showed a patent IVC with the filter in excellent location without any tilt or problems with the filter. I then placed the snare and was able to  easily snare the hook without difficulty. I then advanced the sheath, collapsing the filter and removing the filter in its entirety through the sheath. The retrieval sheath was then removed. Pressure was held on the access site and a sterile dressing was placed. The patient tolerated the procedure well and was taken to the recovery room in stable condition.    ____________________________ Algernon Huxley, MD jsd:TT D: 10/18/2014 13:37:36 ET T: 10/18/2014 17:33:04 ET JOB#: [id number]  cc: Algernon Huxley, MD, <Dictator> Algernon Huxley MD ELECTRONICALLY SIGNED 10/30/2014 10:10

## 2015-04-07 NOTE — Op Note (Signed)
PATIENT NAME:  [name], [name] MR#:  [id number] DATE OF BIRTH:  10/07/1946  DATE OF PROCEDURE:  02/13/2014  DATE OF PROCEDURE: 02/13/2014.   PREOPERATIVE DIAGNOSES: 1.  Recent deep venous thrombosis and pulmonary embolus.  2.  Gastrointestinal bleeding with anticoagulation and history of peptic ulcer disease.   POSTOPERATIVE DIAGNOSES: 1.  Recent deep venous thrombosis and pulmonary embolus.  2.  Gastrointestinal bleeding with anticoagulation and history of peptic ulcer disease.   PROCEDURE:  1.  Ultrasound guidance for vascular access, right femoral vein.  2.  Catheter placement into vena cava from right femoral venous approach.  3.  Inferior venacavogram.  4.  Placement of a Bard Meridian IVC filter.   SURGEON: Algernon Huxley, M.D.   ANESTHESIA: Local with moderate conscious sedation.   ESTIMATED BLOOD LOSS: Minimal   FLUOROSCOPY TIME: Approximately 1 minute.   CONTRAST USED: 30 mL.   INDICATION FOR PROCEDURE: This is 69 year old white male with a history of peptic ulcer disease. About a month ago he had a DVT and pulmonary embolus, was started on anticoagulation. He returned to the hospital yesterday with anemia and upper GI bleeding. His anticoagulation will need to be stopped. For this reason, an IVC filter will be placed.   DESCRIPTION OF PROCEDURE: The patient is brought to the vascular suite. Groins were shaved and prepped and a sterile surgical field was created. The femoral vein was visualized on ultrasound and found to be patent. It was then accessed under direct ultrasound guidance without difficulty with a Seldinger needle. A J-wire was then placed. After skin nick and dilatation, the delivery sheath was placed into the inferior vena cava and an inferior venacavogram was performed. The level of the renal veins was found to be approximately the top of L1. The filter was deployed at the top of L2. The delivery sheath was then removed. Pressure was held on the groin. Sterile  dressing was placed. The patient tolerated the procedure well and was taken to the recovery room in stable condition.   ____________________________ Algernon Huxley, MD jsd:cs D: 02/13/2014 08:33:44 ET T: 02/13/2014 20:14:00 ET JOB#: [id number]  cc: Algernon Huxley, MD, <Dictator> Algernon Huxley MD ELECTRONICALLY SIGNED 02/15/2014 14:39

## 2015-04-07 NOTE — Consult Note (Signed)
PATIENT NAME:  [name], [name] MR#:  [id number] DATE OF BIRTH:  [birth year]  DATE OF CONSULTATION:  01/26/2014  REFERRING PHYSICIAN:  Loletha Grayer, MD CONSULTING PHYSICIAN:  Lew Dawes. Genevive Bi, MD  REASON FOR CONSULTATION: Right pleural effusion Loletha Grayer.   HISTORY OF PRESENT ILLNESS: I have personally seen and examined [name]. I have discussed his care with Dr. Leslye Peer.   [name] is a 69 year old gentleman who presented to our Emergency Room approximately 5 days ago with complaints of shortness of breath, bilateral lower chest wall pain made worse with deep inspiration, a dry hacking cough and some mild abdominal tenderness and distention. He had a chest x-ray made which revealed a very small right-sided pleural effusion but no obvious masses, tumors or infiltrates. He was discharged to home where he subsequently followed up with his primary care physician, as well as his gastroenterologist and nephrologist.  Over the course of the subsequent days following his first Emergency Room visit, he was represented today with the similar complaints but with markedly increasing shortness of breath. He has another chest x-ray confirmed the presence of a very large right-sided pleural effusion. He did undergo thoracentesis today of 2.8 liters, which was halted because of the patient's coughing. There was clear amber fluid removed. It did not appear to be purulent. The patient states that his breathing has improved some after that. He has had some low-grade fevers at home as high as 101. I have also noticed that early last week he had traveled to [place] and spent a considerable period of time sitting, immobile during the flight. When he got out of the airplane, he was walking up the ramp and noticed that he was acutely short of breath and had to stop. He states that his shortness of breath improved some and he ultimately worked through this and presented back home when he went to the  Emergency Department.   PAST MEDICAL HISTORY:  Significant for gastric bypass by Dr. Volanda Napoleon in [facility].  He has also had a cholecystectomy 2 years ago by Dr. Rochel Brome. That procedure apparently was complicated by a prolonged bile leak. He has no other significant medical problems. He does have a history of diabetes, though, and has had a history of a renal cell carcinoma status post left nephrectomy about 10 years ago.   FAMILY HISTORY: Negative for any lung diseases. He does state that his mother died of lung cancer, but his father died of a myocardial infarction.   SOCIAL HISTORY: He smoked for many years and quit smoking 3 months ago. 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   REVIEW OF SYSTEMS: As per history of present illness and all other review of systems were asked and were negative.   PHYSICAL EXAMINATION: GENERAL: He is an obese, well-developed, well-nourished gentleman in no acute distress. He is able to speak in complete sentences. He does not appear to be toxic.  NECK: Supple. CHEST:  His breath sounds are diminished over his right hemithorax. Left side is clear.  HEART: Regular.  ABDOMEN: Obese, soft and mildly tender in the epigastrium.  EXTREMITIES: Without clubbing, cyanosis or edema. Specifically, there is no evidence of swelling in either calf or ankle.   ASSESSMENT AND PLAN: The patient has had a successful thoracentesis on the right side. Differential diagnoses certainly would include a malignancy given his prior history of renal cell carcinoma and his long smoking history. In  addition, his low-grade fever could be consistent with an infectious process. However, I would still be concerned about the possibility of a pulmonary embolism and/or some intra-abdominal process with transudation of fluid across the diaphragm. At the present time, I would recommend that we obtain a chest CT scan, as well as an abdominal  CT scan with contrast. I would like to have the right hemithorax evacuated of as much fluid as possible prior to performing these, as this will give [place] better visualization of the underlying lung parenchyma if there is no pleural effusion present. I will continue to follow the patient along with you.   Thank you very much for allowing me to participate in his care.  ____________________________ Lew Dawes. Genevive Bi, MD teo:ce D: 01/26/2014 [id number] ET T: 01/26/2014 17:20:36 ET JOB#: [id number]  cc: Christia Reading E. Genevive Bi, MD, <Dictator> Louis Matte MD ELECTRONICALLY SIGNED 02/12/2014 19:42

## 2015-04-07 NOTE — Consult Note (Signed)
Large anastomotic ulcer with visible vessel. No active bleeding. Area cauterized. Full liquid diet rest of today. Advance as tolerated. Continue carafate and protonix long term. thanks.  Electronic Signatures: Verdie Shire (MD)  (Signed on 02-Mar-15 15:18)  Authored  Last Updated: 02-Mar-15 15:18 by Verdie Shire (MD)

## 2015-04-07 NOTE — Consult Note (Signed)
Pt seen and examined. Full consult to follow. Pt of Dr. Percell Boston. Known hx of anastomotic ulcer bleeding in 2013, Was hospitalized recently with bleeding but EGD not done then. Now, with melena and signif drop in hgb. Given PRBC. INR fortunately still low. Just had IVC filter placed for hx of DVT/PE. Pt wants to find source of bleeding. Pt on protonix bid and carafate tid. Likely bleeding from anastomosis again. Will plan EGD later today. Keep patient NPO. thanks.  Electronic Signatures: Verdie Shire (MD)  (Signed on 02-Mar-15 09:04)  Authored  Last Updated: 02-Mar-15 09:04 by Verdie Shire (MD)

## 2015-04-07 NOTE — Consult Note (Signed)
Chief Complaint:  Subjective/Chief Complaint No complaints. Eating ok. Passage of old blood yest. Breathing better.   VITAL SIGNS/ANCILLARY NOTES: **Vital Signs.:   04-Mar-15 04:48  Vital Signs Type Routine  Temperature Temperature (F) 98.1  Celsius 36.7  Temperature Source oral  Pulse Pulse 84  Respirations Respirations 18  Systolic BP Systolic BP 017  Diastolic BP (mmHg) Diastolic BP (mmHg) 77  Mean BP 92  Pulse Ox % Pulse Ox % 96  Pulse Ox Activity Level  At rest  Oxygen Delivery Room Air/ 21 %   Brief Assessment:  GEN no acute distress   Cardiac Regular   Respiratory clear BS   Gastrointestinal Normal   Lab Results:  Hepatic:  02-Mar-15 05:48   Bilirubin, Total 0.2  Alkaline Phosphatase  276 (45-117 NOTE: New Reference Range 11/04/13)  SGPT (ALT)  124  SGOT (AST)  182  Total Protein, Serum 7.5  Albumin, Serum  2.4  Routine Chem:  02-Mar-15 05:48   Glucose, Serum  148  BUN  29  Creatinine (comp) 1.24  Sodium, Serum 138  Potassium, Serum 4.3  Chloride, Serum  109  CO2, Serum 26  Calcium (Total), Serum 8.7  Osmolality (calc) 284  eGFR (African American) >60  eGFR (Non-African American) >60 (eGFR values <35m/min/1.73 m2 may be an indication of chronic kidney disease (CKD). Calculated eGFR is useful in patients with stable renal function. The eGFR calculation will not be reliable in acutely ill patients when serum creatinine is changing rapidly. It is not useful in  patients on dialysis. The eGFR calculation may not be applicable to patients at the low and high extremes of body sizes, pregnant women, and vegetarians.)  Anion Gap  3  Magnesium, Serum 2.0 (1.8-2.4 THERAPEUTIC RANGE: 4-7 mg/dL TOXIC: > 10 mg/dL  -----------------------)  Routine Coag:  02-Mar-15 05:48   Prothrombin  15.4  INR 1.2 (INR reference interval applies to patients on anticoagulant therapy. A single INR therapeutic range for coumarins is not optimal for all indications;  however, the suggested range for most indications is 2.0 - 3.0. Exceptions to the INR Reference Range may include: Prosthetic heart valves, acute myocardial infarction, prevention of myocardial infarction, and combinations of aspirin and anticoagulant. The need for a higher or lower target INR must be assessed individually. Reference: The Pharmacology and Management of the Vitamin K  antagonists: the seventh ACCP Conference on Antithrombotic and Thrombolytic Therapy. CBLTJQ.3009Sept:126 (3suppl): [id number] A HCT value >55% may artifactually increase the PT.  In one study,  the increase was an average of 25%. Reference:  "Effect on Routine and Special Coagulation Testing Values of Citrate Anticoagulant Adjustment in Patients with High HCT Values." American Journal of Clinical Pathology 2006;126:400-405.)  Routine Hem:  02-Mar-15 00:05   Hemoglobin (CBC)  7.0 (Result(s) reported on 13 Feb 2014 at 12:40AM.)    05:48   WBC (CBC) 8.7  RBC (CBC)  2.85  Hemoglobin (CBC)  8.4  Hematocrit (CBC)  25.1  Platelet Count (CBC)  450  MCV 88  MCH 29.5  MCHC 33.5  RDW  16.4  Neutrophil % 75.2  Lymphocyte % 16.4  Monocyte % 5.8  Eosinophil % 1.6  Basophil % 1.0  Neutrophil # 6.5  Lymphocyte # 1.4  Monocyte # 0.5  Eosinophil # 0.1  Basophil # 0.1 (Result(s) reported on 13 Feb 2014 at 06:11AM.)   Assessment/Plan:  Assessment/Plan:  Assessment Anastomotic ulcer. No active bleeding.   Plan Continue protonix bid and carafate tid. Have patient f/u with Dr.  Elliott in few weeks. May need repeat EGD in 2 months to assess for healing of ulcer, since these types of ulcers often do not heal well even with proper meds. Thanks   Electronic Signatures: Verdie Shire (MD)  (Signed 04-Mar-15 11:06)  Authored: Chief Complaint, VITAL SIGNS/ANCILLARY NOTES, Brief Assessment, Lab Results, Assessment/Plan   Last Updated: 04-Mar-15 11:06 by Verdie Shire (MD)

## 2015-04-07 NOTE — Consult Note (Signed)
PATIENT NAME:  [name], [name] MR#:  [id number] DATE OF BIRTH:  November 15, 1946  DATE OF CONSULTATION:  02/06/2014  REFERRING PHYSICIAN:  Dr. Rexene Edison CONSULTING PHYSICIAN:  Theodore Demark, NP  GI consult was ordered by Dr. Rexene Edison to evaluate for history with ulcers and pain after changing to H2 blockers from PPI therapy.   Appreciate consult for 69 year old Caucasian man originally admitted with shortness of breath and chest pain, found to have large right pleural effusion status post chest tube insertion/VATS decortication for empyema and findings of pulmonary embolus with high-dose Lovenox therapy and history of nephrectomy secondary to renal cancer several years ago, gastric bypass in 2009, PUD in 2013, cholecystectomy in 2013 for evaluation of epigastric abdominal pain. States the epigastric pain is really what originally drove him to the hospital a couple of weeks ago, onset was about the first of this month when his PPI was stopped by his nephrologist due to concerns of possible interstitial nephritis during evaluation for recent proteinuria. His epigastric pain is associated with some dyspepsia. He has been taking Zantac 150 mg p.o. t.i.d. and Carafate t.i.d. at home, but this has not been really helpful in controlling it. He is feeling better now since he has been hospitalized. Currently, he states he is at a 0 out of 10 pain. Reports the pain, however, is variable and can be as high as a 10 out of 10. Spicy foods tend to aggravate it. He was restarted on pantoprazole today. Has been taking Carafate here at 1 gram q.i.d. Denies nausea, vomiting, diarrhea, melena, odynophagia, dysphagia, weight loss and all further GI-related complaints. His hemoglobin is stable over the course of his admission. EGD in 2013 with esophageal and gastrojejunal ulcers. Followup EEG, 03/2012, demonstrated healing to these. The patient worries his ulcers have returned.   PAST MEDICAL HISTORY: Kidney cancer status  post nephrectomy, proteinuria, history of ulcers, anemia, gastric bypass, cholecystectomy.   ALLERGIES: NKDA.   MEDICATIONS: At home, Carafate 1 gram t.i.d., Niferex 150 mg daily, multivitamin 1 tab daily, Norco 5/325, 2 tabs p.o. at bedtime; Paxil 40 mg p.o. daily, tramadol 50 mg p.o. daily, Zantac 150 t.i.d.   SOCIAL HISTORY: Quit smoking 3 months ago. No EtOH, illicits. He works as a Paramedic.   FAMILY HISTORY: Significant for MI, lung cancer.   REVIEW OF SYSTEMS:  Ten systems reviewed, unremarkable other than discomfort at his chest tube site, which is on the right chest; history of palpitations, shortness of breath that he was admitted with has improved with current therapy, stable depression; otherwise, unremarkable as noted.   LABORATORY DATA: Most recent, glucose 150, BUN 23, creatinine 1.31, sodium 137, potassium 4.4, chloride 103, GFR 57, calcium 9.1, lipase 173. A1c 5.5. Hepatic panel: Protein 6.5, albumin 2.2, total bilirubin 0.1, direct bilirubin less than 0.1, ALP 99, AST 21, ALT 35. WBC 9.2, hemoglobin 9.3, hematocrit 29.6, platelet count 527. MCV normal. MCH normal. MCHC slightly decreased. RDW slightly increased. PT 14.1, INR 1.1. Pleural fluid was negative for malignancy. Did have an abdominal ultrasound with no acute finding in the abdomen, although there was some mild hepatomegaly. There were no liver lesions or biliary ductal dilation. 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 Questionable inflammation if the patient has had pain. Mild biliary ductal  dilation consistent with surgical changes in a patient who has had prior cholecystectomy.   PHYSICAL EXAMINATION: VITAL SIGNS: Most recent, temp 98.3, pulse 64, respirations 20,  blood pressure 105/66, SA O2 of 94% on room air.  GENERAL: Well-appearing man resting in bed in no acute distress.  HEENT: Normocephalic, atraumatic. Sclerae clear.  NECK: Supple. No JVD, thyromegaly.  RESPIRATORY: Chest tube intact, patent, draining clear yellow fluid to the right chest. Lung sounds are somewhat diminished to the right lower lobe. Good air movement through the left lung. No increased work of breathing. Speaks in complete sentences.  CARDIAC: S1, S2. RRR. No MRG. No appreciable edema. No gallops, rubs or murmurs.  ABDOMEN: Slightly protuberant. Bowel sounds x 4. Nondistended. Nontender. No guarding, rigidity, notable hepatosplenomegaly or other abnormalities. No peritoneal signs.  SKIN: Warm, dry, pale pink. No obvious erythema, lesion or rash.  PSYCHIATRIC: Alert, cooperative, pleasant, good recall.  NEUROLOGICAL: Alert and oriented x 3. Cranial nerves II through XII intact. Speech clear. No facial droop.  MUSCULOSKELETAL: No clubbing, edema or cyanosis.   IMPRESSION AND PLAN: Epigastric pain. I think this is likely multifactorial. Did discuss rebound symptoms with him about abrupt PPI discontinuance. He is feeling better today. The changes on CT may be related to some inflammation or postsurgical. If they are related to inflammation, PPI therapy has been shown to be more efficacious and H2RA therapy, both in symptoms and acid control. He has no alarm signs presently. His PPI was restarted today. In discussion with pharmacist, interstitial nephritis related to PPI is very rare, less than 1%. Further literature review shows it is a class effect, but that no active metabolite passes through the kidneys. Would recommend continuing the pantoprazole currently, observing his renal status and ensuring hydration. Did discuss with Dr. Gustavo Lah, and we will discuss with Dr. Holley Raring.   These services were provided by Stephens November, MSN, [facility], in collaboration with Lollie Sails, M.D.,  with whom I have discussed this patient in full.   Thank you very much for this consult.   ____________________________ Theodore Demark, NP chl:dmm D: 02/06/2014 18:57:46 ET T: 02/06/2014 19:07:29 ET JOB#: [id number]  cc: Theodore Demark, NP, <Dictator> [place] SIGNED 02/15/2014 8:20

## 2015-04-07 NOTE — H&P (Signed)
PATIENT NAME:  [name], SOCARRAS MR#:  [id number] DATE OF BIRTH:  May 24, 1946  DATE OF ADMISSION:  02/12/2014  REFERRING PHYSICIAN:  Dr. Joni Fears, ER  PRIMARY CARE PHYSICIAN: Dr. Derinda Late  CHIEF COMPLAINT: Melena, with acute GI bleeding due to anticoagulation.   HISTORY OF PRESENT ILLNESS:  This is a very nice, 69 year old gentleman with history of right lower lobe pneumonia with empyema involving many antibiotics. Had video-assisted thoracic surgery and decortication on February 16 of this year, with a chest tube. The patient finished his antibiotics and was discharged. He also had a right lower lobe pulmonary embolus due to a DVT, for what the patient was discharged on Coumadin and Lovenox. On top of that, the patient was septic with pneumonia, had a prolonged hospitalization, and he was taking care of by Dr. Mortimer Fries and Dr. Nestor Lewandowsky. The patient has been taking Coumadin, 5 mg dose was increased yesterday because his INR was not therapeutic, and has been getting full dose of Lovenox for anticoagulation. The patient states that he has history of peptic ulcer disease, and he had GI bleeding 2 years ago. At this time, he comes with severe melena, dark stools, feeling really weak, dizzy and lightheaded. His hemoglobin is 7.5. His previous hemoglobin at discharge was 10.5. The patient states that his previous bowel movements were normal up until today. The patient is admitted for GI bleeding.  REVIEW OF SYSTEMS:  A 12-system review of systems is done. CONSTITUTIONAL: No fever, fatigue. Positive weakness today, but not prior days.  EYES: No blurry vision, double vision.  EARS, NOSE, THROAT: No tinnitus or epistaxis.  RESPIRATORY: No cough, wheezing, or COPD.  CARDIOVASCULAR: No chest pain, orthopnea, or syncope.  GASTROINTESTINAL: No nausea, vomiting, abdominal pain, constipation or diarrhea. Positive melena.  GENITOURINARY: No dysuria, hematuria, or changes in frequency.  ENDOCRINE: No polyuria,  polydipsia, polyphagia.  HEMATOLOGIC AND LYMPHATIC: No anemia, easy bruising or bleeding.  SKIN: No rashes or petechiae.  MUSCULOSKELETAL: No neck pain or back pain.  NEUROLOGIC: No numbness, tingling or CVA.  PSYCHIATRIC: No insomnia or depression.   PAST MEDICAL HISTORY: 1.  Peptic ulcer disease.  2.  Kidney cancer.  3.  Proteinuria.  4.  Chronic disease anemia.  5.  Right lower lobe pneumonia complicated with empyema.  6.  Pulmonary embolus.  7.  Lower extremity DVT.   8.  GERD.   PAST SURGICAL HISTORY: 1.  Left nephrectomy due to kidney cancer.  2.  Gastric bypass.  3.  Cholecystectomy.  4.  Video-assisted thoracic surgery with decortication on February 16, with chest tube.  ALLERGIES: No known drug allergies.  MEDICATIONS:  Warfarin 7.5 mg once a day, Tramadol 50 mg 4 times a day, Senna 1 p.o. every day, Protonix 40 mg twice daily, Paroxetine 20 mg take 2 tablets once a day, Norco 325/5 once a day, multivitamin once a day, glyburide 1.25 mg once daily, Ferrex 150 mg twice daily, Lovenox 105 mg twice daily, docusate 100 mg 2 times daily, Carafate 1 gram 3 times a day.   SOCIAL HISTORY: The patient used to smoke, but he quit 3 months ago. He does not drink. He does not use any drugs. He is a Hotel manager. He lives with his wife.   FAMILY HISTORY: Positive for MI in his father. His mother had lung cancer.   PHYSICAL EXAMINATION: VITAL SIGNS: Blood pressure 101/58, pulse 91, respirations 22, temperature 98.3, oxygen saturation 97% on room air.  GENERAL: The patient is alert, oriented x  3, in no acute distress. No respiratory distress. Hemodynamically stable.  HEENT: Pupils are equal and reactive. Extraocular movements are intact. Mucosa are moist. Anicteric sclerae, pale conjunctivae. No oral lesions. No oropharyngeal exudates.  NECK: Supple. No JVD. No thyromegaly. No adenopathy. No carotid bruits.   CARDIOVASCULAR: Regular rate and rhythm. No murmurs, rubs or gallops are  appreciated. No displacement of PMI. No tenderness to palpation anterior chest wall.  LUNGS: Clear, without any wheezing or crepitus. No use of accessory muscles at this moment. Decreased respiratory sounds in both bases.  ABDOMEN: Soft, nontender, nondistended. No hepatosplenomegaly. No masses. Bowel sounds are positive.  LYMPHATICS: Negative for lymphadenopathy in neck or supraclavicular areas.  MUSCULOSKELETAL: No significant edema, clubbing or cyanosis.  VASCULAR: Pulses +2. Capillary refill less than 3. PSYCHIATRIC: No significant agitation. The patient is alert, oriented x 3. Normal affect.  NEUROLOGIC: Cranial nerves II through XII intact. Strength is 5/5 in 4 extremities. DTRs +1 bilaterally.  SKIN: No rashes, petechiae, or ulcers. HEMATOLOGIC AND LYMPHATIC: No lymph nodes palpable in supraclavicular areas.  MUSCULOSKELETAL: No joint deformity or joint effusion.   LABS: Glucose is 228, BUN 33, creatinine 1.14, potassium 4.8. His LFTs are overall within normal limits, with albumin of 2.3. His white count is 16,000, his hemoglobin is 7.5, his platelet count is 453. INR is 1.2, PTT 48. Urinalysis:  No signs of urinary tract infection. Portable chest x-ray: Continuous improvement of small right vascular hydropneumothorax, minimal residual atelectasis and scarring of the right base of the lung, which is clear, without edema.   ASSESSMENT AND PLAN: A 69 year old gentleman with history of previous admission due to severe pneumonia complicated with empyema, status post video-assisted thoracic surgery. The patient was discharged on Lovenox and Coumadin due to development of a deep vein thrombosis and a pulmonary embolus when he was in the hospital. Comes back with melena.   1. Upper gastrointestinal bleeding. The patient presented with significant melena, no hematemesis. The patient is doing okay right now, hemodynamically stable. Blood pressure borderline low, but he is about to get his first unit of  blood. The patient is started on a Protonix drip. He has history of peptic ulcer disease in the past and a gastric bypass, for what we recommended GI consultation for EGD, as the patient might need to have a catheterization or procedure done due to possible ulcer. The patient is otherwise hemodynamically stable. Check hemoglobin every 6 and monitor under telemetry.   2. Pulmonary embolus. At this moment, I cannot anticoagulate this patient. He took his Lovenox dose. He is not actively bleeding, for what I do not think there is a need to use protamine to reverse the Lovenox. His INR is 1.2, no need for fresh frozen plasma at this moment. We are going to continue to monitor INR. The patient is not able to be anticoagulated, for what I am going to ask for a Vascular Surgery consult for possible inferior vena cava filter placement.   3.  History of pneumonia with video-assisted thoracic surgery, now resolved, off antibiotics.   4.  Kidney cancer, now in remission.   5.  Diabetes. The patient is on glimepiride. I am going to monitor his blood sugars every 6 hours and only treat his blood sugars if they are above 250.   6.  Gastrointestinal prophylaxis: Protonix drip.   7.  Deep vein thrombosis prophylaxis. At this moment, the patient is going to be addressed, since he has a recently diagnosed DVT. No anticoagulation due  to the GI bleeding. Evaluation for IVC filter.   I spent about 50 minutes with this patient.    ____________________________ Coyle Sink, MD rsg:mr D: 02/12/2014 14:44:25 ET T: 02/12/2014 15:37:40 ET JOB#: [id number]  cc: [place] Sink, MD, <Dictator> Azalee Weimer America Brown MD ELECTRONICALLY SIGNED 02/14/2014 13:15

## 2015-04-07 NOTE — Discharge Summary (Signed)
PATIENT NAME:  [name], [name] MR#:  [id number] DATE OF BIRTH:  01/22/1946  DATE OF ADMISSION:  01/26/2014 DATE OF DISCHARGE:  02/07/2014  DISCHARGE DIAGNOSES:  1.  Right lower lobe pneumonia with empyema, improving with antibiotics, status post video-assisted thoracic surgery and decortication 16th of February, also chest tube removal by Dr. Genevive Bi. Will need to finish course of antibiotic until 27th of February 2015.  2.  Right lower lobe pulmonary embolism and deep vein thrombosis, started on Coumadin, also on Lovenox bridging with a goal INR of 2 to 3, but considering his history of duodenal ulcer, he remains at very high risk for bleeding, and risks and benefits of anticoagulation were explained to the patient and family.  3.  Anemia of chronic disease, stable.  4.  Sepsis from pneumonia, improved.  5.  Gastroesophageal reflux disease/peptic ulcer disease requiring proton pump inhibitor. Nephrology and GI are in agreement to this, with close monitoring with nephrology for monitoring of proteinuria as an outpatient.   SECONDARY DIAGNOSES: 1.  History of kidney cancer, status post resection.  2.  History of ulcers and anemia.   CONSULTATIONS: 1.  Dr. Nestor Lewandowsky from surgery.  2.  Pulmonary, Dr. Flora Lipps.   PROCEDURES AND RADIOLOGY: Pigtail procedure/radiology. Chest tube placement by Dr. Genevive Bi. VATS and decortication done by Dr. Genevive Bi on 16th of February.   Chest x-ray on 27th of February showed right basilar hydropneumothorax, which is slightly smaller in size. Atelectasis.   Please see Dr. Boykin Reaper dictated interim discharge summary on 20th of February for detailed procedures and radiology from admission until 20th of February. Subsequently, the patient had a chest x-ray on the 21st of February, which showed 3 right-sided chest tubes in place with no pneumothorax and very little residual right-sided pleural effusion. No new abnormalities.   Chest x-ray on the 23rd of February showed  stable appearance.   Chest x-ray on the 24th of February showed stable small right inferolateral pneumothorax. Small subcutaneous emphysema in the right lower chest wall. Right chest tube has been removed. Clear left lung field.   HISTORY AND SHORT HOSPITAL COURSE: The patient is a 69 year old male with above-mentioned medical problems who was admitted on 12th of February by Dr. Leslye Peer for chest pain and shortness of breath. Was found to have large right-sided pleural effusion, worrisome for empyema. Please see Dr. Marshia Ly dictated history and physical for further details. Please see Dr. Boykin Reaper dictated interim discharge summary on 20th of February for detailed course from admission until 20th of February. Subsequently, the patient was followed by surgery and chest tubes were kept until all the drainage occurred. He had 2 chest tubes removed on 23rd of February and the third one removed on 24th of February, as there was very minimal drainage. There was concern for him being on Zantac and not PPI considering him being on blood thinner and also his history of peptic ulcer disease. His PPI was changed to Zantac by nephrology, Dr. Holley Raring, considering his proteinuria, but now his risk of bleeding was much higher due to these new acute changes, so after discussion with nephrology and GI, the patient was placed back on PPI and was feeling much better and was discharged home on 24th of February in stable condition.   PHYSICAL EXAMINATION: VITAL SIGNS: On the date of discharge are as follows: Temperature 97.9, heart rate 63 per minute, respirations 20 per minute, blood pressure 138/77 mmHg. He was saturating 94% on room air.  CARDIOVASCULAR: S1, S2 normal.  No murmurs, rubs, or gallop.  LUNGS: Clear to auscultation bilaterally. No wheezing, rales, rhonchi, or crepitation.  ABDOMEN: Soft, benign.  NEUROLOGIC: Nonfocal examination.   All other physical examination remained at baseline.   DISCHARGE  MEDICATIONS: 1.  Paroxetine 20 mg 2 tablets p.o. daily.  2.  Multivitamin once daily.  3.  Ferrex 1 capsule twice a day.  4.  Tramadol 50 mg p.o. 4 times a day.  5.  Carafate 1 gram p.o. 3 times a day.  6.  Warfarin 5 mg p.o. at bedtime.  7.  Lovenox 105 mg subcutaneous b.i.d. for 5 days.  8.  Senna 1 tablet p.o. daily as needed.  9.  Colace 100 mg p.o. b.i.d.  10.  Augmentin 875/125 mg p.o. b.i.d. for 3 days.  11.  Norco 325/5 mg 2 tablets p.o. daily as needed.  12.  Protonix 40 mg p.o. b.i.d.  13.  Glyburide 1.25 mg p.o. daily.   DISCHARGE DIET: Low-sodium.   DISCHARGE ACTIVITY: As tolerated.   DISCHARGE INSTRUCTIONS AND FOLLOWUP: The patient was instructed to follow up with his primary care physician, Dr. Baldemar Lenis, in 2 to 4 weeks. He will need followup with Dr. Lyda Kalata from [facility] in 1 to 2 weeks, with Dr. Genevive Bi on the 26th of February on Thursday. He will need follow-up with nephrology, Dr. Glean Salen, in 4 to 6 weeks and with Dr. Vira Agar from GI in 2 to 3 weeks. He will get PT/INR checked on 27th of  the 27th of February on Friday and 2nd of March on Monday, with results forwarded to his primary care physician for Coumadin dose adjustment.   Please note Patient is at very high risk for bleeding and risks and benefits of anticoagulation were explained to the patient and family and they've opted to go with full dose anticoagulation at this time but if he does bleed this may need to be reevaluated.  Total time discharging this patient was 55 minutes.   ____________________________ Lucina Mellow. Manuella Ghazi, MD vss:jcm D: 02/11/2014 16:50:34 ET T: 02/11/2014 19:14:29 ET JOB#: [id number]  cc: Rosalva Neary S. Manuella Ghazi, MD, <Dictator> Derinda Late, MD Dr. Lake Bells, [facility] Lilian Kapur, MD Lew Dawes. Genevive Bi, MD Manya Silvas, MD  Lucina Mellow [facility] MD ELECTRONICALLY SIGNED 02/15/2014 21:23

## 2015-04-07 NOTE — Consult Note (Signed)
Chief Complaint:  Subjective/Chief Complaint seen for abdominal pain.  doing well, feeling better after removal of chest tube.  denies n/v or abdominalpain.   VITAL SIGNS/ANCILLARY NOTES: **Vital Signs.:   24-Feb-15 13:29  Vital Signs Type Routine  Celsius 36.8  Temperature Source oral  Pulse Pulse 73  Respirations Respirations 20  Systolic BP Systolic BP 97  Diastolic BP (mmHg) Diastolic BP (mmHg) 58  Mean BP 71  Pulse Ox % Pulse Ox % 95  Pulse Ox Activity Level  At rest  Oxygen Delivery Room Air/ 21 %   Brief Assessment:  Cardiac Regular   Respiratory clear BS   Gastrointestinal details normal Soft  Nontender  Nondistended  No masses palpable  Bowel sounds normal   Lab Results: Routine Coag:  24-Feb-15 06:18   Prothrombin 14.2  INR 1.1 (INR reference interval applies to patients on anticoagulant therapy. A single INR therapeutic range for coumarins is not optimal for all indications; however, the suggested range for most indications is 2.0 - 3.0. Exceptions to the INR Reference Range may include: Prosthetic heart valves, acute myocardial infarction, prevention of myocardial infarction, and combinations of aspirin and anticoagulant. The need for a higher or lower target INR must be assessed individually. Reference: The Pharmacology and Management of the Vitamin K  antagonists: the seventh ACCP Conference on Antithrombotic and Thrombolytic Therapy. [id number] Sept:126 (3suppl): [id number]. A HCT value >55% may artifactually increase the PT.  In one study,  the increase was an average of 25%. Reference:  "Effect on Routine and Special Coagulation Testing Values of Citrate Anticoagulant Adjustment in Patients with High HCT Values." American Journal of Clinical Pathology 2006;126:400-405.)  Routine Hem:  24-Feb-15 15:58   WBC (CBC) 9.5  RBC (CBC)  3.55  Hemoglobin (CBC)  10.5  Hematocrit (CBC)  32.1  Platelet Count (CBC)  634  MCV 90  MCH 29.6  MCHC 32.7  RDW   16.8  Neutrophil % 70.8  Lymphocyte % 17.0  Monocyte % 7.6  Eosinophil % 3.7  Basophil % 0.9  Neutrophil #  6.8  Lymphocyte # 1.6  Monocyte # 0.7  Eosinophil # 0.4  Basophil # 0.1 (Result(s) reported on 07 Feb 2014 at 04:24PM.)   Assessment/Plan:  Assessment/Plan:  Assessment 1) abdominal pain in the setting of h/o gastric bypass and anastomotic ulcers.  doing well currently.  on bid zantac and carafate.   Plan 1) case discussed with Drs Manuella Ghazi, Holley Raring and Genevive Bi.  would continue single dose ppi daily, protonix 40 mg  for now, with close renal fu re proteinuria.  Continue  qid carafate.  it is encouraging that he is currently with less abdominal pain, but has now been of clears for a day also, and certain fool items seem to increaese the epigastric pain.  Would change diet to full liquids tonight, continue for another day or 2, then advance to soft/low residue.  Will need GI fu in about 10 days as o/p.   Electronic Signatures: Loistine Simas (MD)  (Signed 24-Feb-15 17:57)  Authored: Chief Complaint, VITAL SIGNS/ANCILLARY NOTES, Brief Assessment, Lab Results, Assessment/Plan   Last Updated: 24-Feb-15 17:57 by Loistine Simas (MD)

## 2015-04-07 NOTE — Consult Note (Signed)
Chief Complaint:  Subjective/Chief Complaint Please seee full GI cosnult and brief consult note.  Paitent admitted with shortness of breath and found to have pleural effusion.  Patient with h/o gastric bypass and severe GI bleed from anastomosis site.  He was taking lansoprazole for treatment/maintenance, recently taken off and changed to zantac due to concern for interstitial nephritis as a s/e of ppi treatment.  However since that time he has had increasing abdominalpain in the epigastrum c/w that he has had in the past with ulcer.  He states the zantac, even at the higher dose of 300 mg bid was not effective, even in combination with carafate.  Of note  patients spouse states she is not allowing him to take the ppi when brought to him and he has been taken off the ranitidine.  Abdominal pain currently better, but comes and goes with meals, etc.  Will d/c ppi , restart ranitidine, continue carafate.  I will need to discuss further with Dr Driscilla Moats tomorrow.  Further recs to follow.   VITAL SIGNS/ANCILLARY NOTES: **Vital Signs.:   23-Feb-15 17:22  Temperature Temperature (F) 97.7  Celsius 36.5  Temperature Source oral  Pulse Pulse 73  Respirations Respirations 20  Systolic BP Systolic BP 448  Diastolic BP (mmHg) Diastolic BP (mmHg) 63  Mean BP 77  Pulse Ox % Pulse Ox % 91  Pulse Ox Activity Level  At rest  Oxygen Delivery Room Air/ 21 %   Lab Results: Routine Chem:  23-Feb-15 06:03   Glucose, Serum  150  BUN  23  Creatinine (comp)  1.31  Sodium, Serum 137  Potassium, Serum 4.4  Chloride, Serum 103  CO2, Serum 31  Calcium (Total), Serum 9.1  Anion Gap  3  Osmolality (calc) 280  eGFR (African American) >60  eGFR (Non-African American)  57 (eGFR values <4m/min/1.73 m2 may be an indication of chronic kidney disease (CKD). Calculated eGFR is useful in patients with stable renal function. The eGFR calculation will not be reliable in acutely ill patients when serum creatinine is  changing rapidly. It is not useful in  patients on dialysis. The eGFR calculation may not be applicable to patients at the low and high extremes of body sizes, pregnant women, and vegetarians.)  Routine Coag:  23-Feb-15 16:46   Prothrombin 14.1  INR 1.1 (INR reference interval applies to patients on anticoagulant therapy. A single INR therapeutic range for coumarins is not optimal for all indications; however, the suggested range for most indications is 2.0 - 3.0. Exceptions to the INR Reference Range may include: Prosthetic heart valves, acute myocardial infarction, prevention of myocardial infarction, and combinations of aspirin and anticoagulant. The need for a higher or lower target INR must be assessed individually. Reference: The Pharmacology and Management of the Vitamin K  antagonists: the seventh ACCP Conference on Antithrombotic and Thrombolytic Therapy. [id number]:126 (3suppl): [id number] A HCT value >55% may artifactually increase the PT.  In one study,  the increase was an average of 25%. Reference:  "Effect on Routine and Special Coagulation Testing Values of Citrate Anticoagulant Adjustment in Patients with High HCT Values." American Journal of Clinical Pathology 2006;126:400-405.)  Routine Hem:  23-Feb-15 06:03   WBC (CBC) 9.2  RBC (CBC)  3.28  Hemoglobin (CBC)  9.3  Hematocrit (CBC)  29.6  Platelet Count (CBC)  527  MCV 90  MCH 28.4  MCHC  31.5  RDW  16.5  Neutrophil % 73.0  Lymphocyte % 14.6  Monocyte % 7.4  Eosinophil %  4.2  Basophil % 0.8  Neutrophil #  6.7  Lymphocyte # 1.3  Monocyte # 0.7  Eosinophil # 0.4  Basophil # 0.1 (Result(s) reported on 06 Feb 2014 at 06:43AM.)   Radiology Results: CT:    13-Feb-15 16:34, CT Guided Tube Placement  CT Guided Tube Placement   REASON FOR EXAM:    right pleural effusion-pigtail catheter to Pleuravac  COMMENTS:       PROCEDURE: CT  - CT GUIDED TUBE PLACEMENT  - Jan 27 2014  4:34PM     CLINICAL DATA:   Right pleural effusion    EXAM:  CT GUIDE TUBE PLACEMENT    MEDICATIONS ANDMEDICAL HISTORY:  Versed 2.5 mg, Fentanyl 75 mcg.    Additional Medications: None.  ANESTHESIA/SEDATION:  Moderate sedation time: 35 minutes    CONTRAST:  Nine    PROCEDURE:  The procedure, risks, benefits, and alternatives were explained to  the patient. Questions regarding the procedure were encouraged and  answered. The patient understands and consents to the procedure.    The posterior lateral right lower chest wall was prepped with  chlorhexidine in a sterile fashion, and a sterile drape wasapplied  covering the operative field. A sterile gown and sterile gloves were  used for the procedure.  COMPLICATIONS:  None    FINDINGS:  Utilizing CT guidance a proper entry site for chest tube placement  was established. The entry site was anesthetized with 6 ml 1%  lidocaine without epinephrine. A small dermatotomy was formed at the  injury site. Access was obtained within the pleural space with a 21  gauge 15 cm Chiba biopsy needle. Needle placement was performed  utilizing CT guidance. Access was maintained with a 0.018 guide  wire. The entry needle was removed. Access was then obtained with a  6 [place] micro catheter. The inner stiffener was removed. Catheter  placement was performed utilizing CT guidance over the 0.018  guidewire. Access was then obtained with an Amplatz guidewire. The  micro catheter was removed over the guidewire. An 8 [place] dilator  was used to dilate the tract. The right pleural space was accessed  with a 10 [place] multipurpose drainage catheter. Catheter placement  was performed over the Amplatz guidewire utilizing CT guidance.  Appropriate placement was established. Amplatz guidewire was  removed. The inner stiffener was removed over the guidewire prior to  wire removal. The catheter was coped within the right lung base. 500  mL of amber colored fluid was removed from the  pleural space.  Residual fluid was appreciated within the pleural space as well as a  small component of air postprocedural. The patient tolerated the  procedure without complications and was monitored in the structures  recovery suite for approximately 30 min. The patient was returned to  the floor for monitoring an continued orders per the attending  physician.     IMPRESSION:  CT-guided right chest tube placement. Patient tolerated the  procedure without complications.      Electronically Signed    By: Margaree Mackintosh M.D.    On: 01/27/2014 16:54         Verified By: Mikki Santee, M.D., MD   Assessment/Plan:  Assessment/Plan:  Assessment 1) epigastric abdomina pain in the settign of h/o gastric bypass and anastomotic ulcers.   2) concern for possible AIN re ppi use  with protienuria.   Plan as noted above.   Electronic Signatures: Loistine Simas (MD)  (Signed [phone] 20:54)  Authored: Chief Complaint, VITAL  SIGNS/ANCILLARY NOTES, Lab Results, Radiology Results, Assessment/Plan   Last Updated: 23-Feb-15 20:54 by Loistine Simas (MD)

## 2015-04-07 NOTE — Discharge Summary (Signed)
PATIENT NAME:  [name], [name] MR#:  [id number] DATE OF BIRTH:  [birth year]  DATE OF ADMISSION:  02/12/2014 DATE OF DISCHARGE:  02/15/2014  PRIMARY PHYSICIAN: Derinda Late, MD  DISCHARGE DIAGNOSES:  1. Upper gastrointestinal bleed secondary to anastomotic ulcer.  2. Acute blood loss anemia.  3. Diabetes mellitus type 2.  4. History of deep vein thrombosis and pulmonary embolism.   DISCHARGE MEDICATIONS:  1. Norco 5/325 two tablets as needed for pain.  2. Glyburide 1.25 mg p.o. daily.  3. Sucralfate 1 gram p.o. 4 times daily.  4. Colace 100 mg p.o. b.i.d.  5. Tramadol 50 mg 4 times daily.  6. Paxil 20 mg p.o. daily.  Advised to stop the Lovenox and Coumadin.   CONSULTATIONS: GI consult with Dr. Candace Cruise.   FOLLOWUP: The patient is to follow up with Dr. Vira Agar in a few weeks. May need repeat EGD in 2 months.   HOSPITAL COURSE: The patient is a 69 year old male patient admitted on March 1st for GI bleed with melena. The patient had history of lower lobe pneumonia, empyema, was on antibiotics. He had a VATS, then chest tube, which was removed, and the patient developed right lower lobe PE and DVT, and he was given Lovenox and Coumadin. He was discharged on February 67th with the Coumadin and Lovenox. Came back with melena and GI bleed. The patient had dizziness along with lightheadedness, felt weak with severe melena, dark stools. Hemoglobin was 7.7 on admission. The patient, when he was discharged previously, that was 10.5. He was discharged on 24th of February. At that time, it was 10.5. So, he was admitted for GI bleed with acute blood loss anemia due to anticoagulants. The patient received blood transfusion. We stopped Lovenox and Coumadin. The patient's INR was 1.2 on admission. He was transfused 1 unit of packed RBC. He was seen by GI. The patient has history of anastomotic ulcer before, and the patient had an EGD done on 2nd of March which showed 1 cratered ulcer with visible vessel was  found at the anastomosis. The patient has normal esophagus and found to have  anastomotic ulcer r, treated with thermal therapy. He initially was on Protonix drip, then changed to Protonix p.o. along with Carafate. Seen by Dr. Candace Cruise in followup. He recommended that he should continue Protonix and Carafate and have a followup with Dr. Vira Agar in 2 months to have a repeat endoscopy to see if the ulcer is improving. The patient's hemoglobin stayed stable at 8.3. He did not have further bleeding. The patient's creatinine stayed stable. We discharge home with Carafate and Protonix, and the patient advised to follow up with Dr. Baldemar Lenis on Monday to have repeat CBC drawn and see if it is improving. The patient also had hypercoagulation workup with antiphospholipid antibody syndrome levels, antithrombin levels and also protein C, and they are all within normal limits. The patient also had an IVC filter placed for his DVT. The patient seen by vascular, and Dr. Lucky Cowboy put in an IVC filter in his IVC.   TIME SPENT ON DISCHARGE PREPARATION: More than 30 minutes.   ____________________________ Epifanio Lesches, MD sk:lb D: 02/17/2014 12:27:34 ET T: 02/17/2014 12:59:12 ET JOB#: [id number]  cc: Epifanio Lesches, MD, <Dictator> Epifanio Lesches MD ELECTRONICALLY SIGNED 02/27/2014 11:47

## 2015-04-07 NOTE — H&P (Signed)
PATIENT NAME:  [name], [name] MR#:  [id number] DATE OF BIRTH:  August 23, 1946  DATE OF ADMISSION:  01/26/2014  PRIMARY CARE PHYSICIAN: Dr. Baldemar Lenis.   CHIEF COMPLAINT: Chest pain and shortness of breath.   HISTORY OF PRESENT ILLNESS: This is a 69 year old man, who has been having chest pain going on for 3 to 4 weeks around the ribs back to the shoulder blades. It stopped and then it came back, worse with lying down, walking and changing positions. He has been taking tramadol during the day and hydrocodone at night. He has been having fevers at night and has been short of breath. Pain in the center of his chest can be very severe at times and radiates around to the back. Today, more right of center. In the ER, he had a chest x-ray that was done that showed a e large right pleural effusion that fills most of the right hemithorax. Hospitalist services were contacted for further evaluation.   PAST MEDICAL HISTORY: Kidney cancer, status post resection, proteinuria, history of ulcers and anemia.   PAST SURGICAL HISTORY: Kidney cancer, status post left nephrectomy, gastric bypass and cholecystectomy.   ALLERGIES: No known drug allergies.   MEDICATIONS: Include Carafate 1 gram 3 times a day,  niferex 150 mg daily, multivitamin 1 tablet daily, Norco 5/325, 2 tablets at night, Paxil 40 mg daily, tramadol 50 mg during the day and Zantac 300 mg twice a day.   SOCIAL HISTORY: Quit smoking 3e months ago. No alcohol. No drug use. Works as a Paramedic.   FAMILY HISTORY: Father died of an MI. Mother died of lung cancer.   REVIEW OF SYSTEMS: CONSTITUTIONAL: Positive for fever. Positive for sweats. No weight loss. No weight gain. Positive for weakness. EARS, NOSE, MOUTH AND THROAT: Positive for congestion. No sore throat. No difficulty swallowing. CARDIOVASCULAR: Positive for chest pain. Positive for palpitations. RESPIRATORY: Positive for shortness of breath. Positive for cough, sometimes  brings up some amber phlegm, sometimes it is clear. GASTROINTESTINAL: Positive for abdominal pain. No nausea. No vomiting. Positive for constipation, but did have diarrhea the other day. No bright red blood per rectum. No melena.  GENITOURINARY: No burning on urination or hematuria. MUSCULOSKELETAL: Pains in the chest. INTEGUMENT: No rashes or eruptions. NEUROLOGIC: No fainting or blackouts.  PSYCHIATRIC: On medication for depression. ENDOCRINE: No thyroid problems. HEMATOLOGIC AND LYMPHATIC: No anemia. No easy bruising or bleeding.   PHYSICAL EXAMINATION:  VITAL SIGNS: On presentation included a temperature of 98.2, pulse 115, respirations 24, blood pressure 151/97, pulse oximetry 94% on room air.  GENERAL: No respiratory distress.  EYES: Conjunctivae and lids normal. Pupils equal, round, and reactive to light. Extraocular muscles intact. No nystagmus. Ears, nose, mouth and throat: Tympanic membranes: No erythema. Nasal mucosa: No erythema. Throat: No erythema. No exudate seen. Lips and gums: No lesions.  NECK: No JVD. No bruits. No lymphadenopathy. No thyromegaly. No thyroid nodules palpated.  RESPIRATORY: Poor air entry right lung. Left lung decreased breath sounds and scattered rhonchi.  CARDIOVASCULAR SYSTEM: S1 and S2, tachycardic. No gallops, rubs, or murmurs heard. Carotid upstroke 2+ bilaterally. No bruits. Dorsalis pedis pulses 2+ bilaterally. No edema of the lower extremity.  ABDOMEN: Soft. Slight tenderness in the lower abdomen. No organomegaly/splenomegaly. Normoactive bowel sounds.  LYMPHATIC: No lymph nodes in the neck.  MUSCULOSKELETAL: No clubbing, edema or cyanosis on oxygen.  PSYCHIATRIC: The patient is oriented to person, place, and time.  NEUROLOGIC: Cranial nerves II through XII grossly intact. Deep tendon  reflexes 1+ bilateral lower extremity.  SKIN: No rashes or ulcers seen.   LABORATORY AND RADIOLOGICAL DATA: Ultrasound of the abdomen did show right pleural effusion and  hepatomegaly. No lesion or ductal dilation. Urinalysis showed 100 mg/dL of protein. Chest x-ray showed large right pleural effusion. Troponin negative. Glucose 258, BUN 17, creatinine 1.01, sodium 134, potassium 3.9, chloride 101, CO2 of 28, calcium 9.1. BNP 393. White blood cell count 12.8, hemoglobin and hematocrit 11.9 and 36.0, platelet count of 285. Liver function tests are normal range. Albumin low at 2.4. EKG showed a sinus tachycardia at 115 beats per minute.   ASSESSMENT AND PLAN:  1.  Large right pleural effusion. Looking back at a previous chest x-ray, this was a very small pleural effusion, now is a large pleural effusion. He does also have systemic inflammatory response syndrome with white count and tachycardia and subjective fever at home. I spoke with Dr. Genevive Bi, cardiothoracic surgery, who recommended interventional radiology to do a thoracentesis. He will follow up afterwards. Likely the patient will need a CT scan after lung expands. I did speak with interventional radiology, who will do the procedure today. I will keep the patient n.p.o. until the procedure is done. I will give antibiotics after the thoracentesis. I ordered blood cultures x 2.  2.  History of ulcer disease. We will put on Protonix and continue Carafate.  3.  Proteinuria, followup as outpatient.  4.  History of kidney cancer in the past over 10 years ago.  5.  Elevated sugar. We will check a hemoglobin A1c to see if he is a diabetic and put on sliding scale at this point.  6.  History of smoking, but quit smoking 3 months ago.  TIME SPENT ON ADMISSION: 55 minutes.   CODE STATUS: The patient is a full code.   ____________________________ Tana Conch. Leslye Peer, MD rjw:aw D: 01/26/2014 13:49:49 ET T: 01/26/2014 14:04:24 ET JOB#: [id number]  cc: Tana Conch. Leslye Peer, MD, <Dictator> Derinda Late, MD Marisue Brooklyn MD ELECTRONICALLY SIGNED 02/04/2014 14:13

## 2015-04-08 NOTE — Consult Note (Signed)
Chief Complaint:   Subjective/Chief Complaint no nausea, mild abdominal discomfort at the drain site. He  reports bowel movement but not recorded.   VITAL SIGNS/ANCILLARY NOTES: **Vital Signs.:   10-Feb-13 13:48   Vital Signs Type Routine   Temperature Temperature (F) 98.1   Celsius 36.7   Temperature Source oral   Pulse Pulse 75   Pulse source per Dinamap   Respirations Respirations 19   Systolic BP Systolic BP 123XX123   Diastolic BP (mmHg) Diastolic BP (mmHg) 69   Mean BP 82   BP Source Dinamap   Pulse Ox % Pulse Ox % 96   Pulse Ox Activity Level  At rest   Oxygen Delivery Room Air/ 21 %   Brief Assessment:   Cardiac Regular    Respiratory clear BS    Gastrointestinal details normal Soft  Nondistended  No masses palpable  Bowel sounds normal  mild tenderness at the drain site, no epigastric pain.   Hepatic:  10-Feb-13 04:32    Bilirubin, Total 0.2   Bilirubin, Direct < 0.1   Alkaline Phosphatase 281   SGPT (ALT) 30   SGOT (AST) 21   Total Protein, Serum 7.2   Albumin, Serum 2.1  Routine Hem:  10-Feb-13 04:32    WBC (CBC) 10.0   RBC (CBC) 2.96   Hemoglobin (CBC) 8.5   Hematocrit (CBC) 25.5   Platelet Count (CBC) 555   MCV 86   MCH 28.6   MCHC 33.1   RDW 14.8   Neutrophil % 72.9   Lymphocyte % 17.7   Monocyte % 6.1   Eosinophil % 3.2   Basophil % 0.1   Neutrophil # 7.3   Lymphocyte # 1.8   Monocyte # 0.6   Eosinophil # 0.3   Basophil # 0.0  Blood Glucose:  10-Feb-13 07:22    POCT Blood Glucose 155    11:42    POCT Blood Glucose 124    16:11    POCT Blood Glucose 110   Assessment/Plan:  Assessment/Plan:   Assessment 1) bild duct leak post CCY-resolving/resolved 2) bloody stool/melena-multiple gastric ulcers-    Plan 1) I have changed po protonix to prevacid solutabs for absorbtion considerations, lill use protonic granules until solutabs are available.   Dr Vira Agar with be back tomorrow.  May need repeat egd prior to d/c.   Electronic  Signatures: Loistine Simas (MD)  (Signed 10-Feb-13 18:31)  Authored: Chief Complaint, VITAL SIGNS/ANCILLARY NOTES, Brief Assessment, Lab Results, Assessment/Plan   Last Updated: 10-Feb-13 18:31 by Loistine Simas (MD)

## 2015-04-08 NOTE — Consult Note (Signed)
Pt EGD showed large anastamotic ulcers on either side, no blood, no visible vessel, no clots.  No blood seen in loop of small bowel.  Also small ulcer at GEJ.  Will treat with high dose PPI and transfuse as needed.  If hgb falls will get bleeding scan as a colonoscopy attempt to pass retrograde up the small bowel resection would be hazardous.   Electronic Signatures: Manya Silvas (MD)  (Signed on 06-Feb-13 14:08)  Authored  Last Updated: 06-Feb-13 14:08 by Manya Silvas (MD)

## 2015-04-08 NOTE — Op Note (Signed)
PATIENT NAME:  [name], [name] MR#:  [id number] DATE OF BIRTH:  06-15-1946  DATE OF PROCEDURE:  12/29/2011  PREOPERATIVE DIAGNOSIS: Chronic cholecystitis, cholelithiasis.   POSTOPERATIVE DIAGNOSIS: Chronic cholecystitis, cholelithiasis.   PROCEDURE: Laparoscopic cholecystectomy, cholangiogram.   SURGEON: Rochel Brome, M.D.   ASSISTANT: Britta Mccreedy, PA-C   ANESTHESIA: General.   INDICATIONS: This 69 year old male has a history of epigastric pains and ultrasound findings of gallstones. Surgery was recommended for definitive treatment.   DESCRIPTION OF PROCEDURE: The patient did have previous gastric bypass surgery.  The patient was placed on the operating table in the supine position under general endotracheal anesthesia. The abdomen was prepared with ChloraPrep and draped in a sterile manner.   A short incision was made in the inferior aspect of the umbilicus and carried down to the deep fascia, which was grasped with a laryngeal hook and elevated. A Veress needle was inserted, aspirated, and irrigated with a saline solution. Next, the peritoneal cavity was inflated with carbon dioxide. The Veress needle was removed. The 10-mm cannula was inserted. The 10-mm, 0-degree laparoscope was inserted to view the peritoneal cavity. Another incision was made in the epigastrium right of the midline to insert an 11-mm cannula. Two incisions were made in the lateral aspect of the right upper quadrant to introduce two 5-mm cannulas.   The gallbladder was found to be moderately distended. The liver appeared normal. There was some moderate gaseous distention of the colon. There was a suggestion of right inguinal hernia. The gallbladder was retracted towards the right shoulder. Multiple adhesions were taken down with blunt and sharp dissection. One artery which was on the surface of the gallbladder was controlled with endoclips and divided. The infundibulum was retracted inferiorly and laterally. Further  dissection was carried out to expose the cystic duct and the cystic artery.  The neck of the gallbladder was mobilized with incision of visceral peritoneum. A critical view of safety was demonstrated. An endoclip was placed across the cystic duct adjacent to the neck of the gallbladder. The cystic duct appeared to be about 5 mm in dimension. An incision was made in the cystic duct to introduce a Reddick catheter. Half-strength Conray-60 dye was injected as the cholangiogram was done with fluoroscopy, demonstrating a moderately dilated biliary tree and prompt flow of dye into the duodenum. No retained stones were seen. The Reddick catheter was removed. The cystic duct was doubly ligated with endoclips and divided. The cystic artery was controlled with double endoclips and divided. The gallbladder was dissected free from the liver with hook and cautery and blunt dissection. The site was irrigated with heparinized saline solution and aspirated multiple times. The gallbladder was completely separated. Hemostasis was intact. The gallbladder was delivered up through the infraumbilical incision, opened and suctioned. It was necessary to incise the fascia by about 6 mm to allow removal of the gallbladder. There were multiple small stones palpated. The gallbladder was submitted in formalin for routine pathology. The right upper quadrant was further inspected, irrigated, and aspirated and hemostasis appeared to be intact. The cannulas were removed, allowing carbon dioxide to escape from the peritoneal cavity. The fascial defect at the umbilicus was closed with a 0 Vicryl figure-of-eight suture. Skin incisions were closed with interrupted 5-0 chromic subcuticular sutures, benzoin, and Steri-Strips. Dressings were applied with paper tape. The patient is now being prepared for transfer to the recovery room.   ____________________________ Lenna Sciara. Rochel Brome, MD jws:bjt D: 12/29/2011 09:03:38 ET T: 12/29/2011 09:47:13  ET JOB#: [id number]  cc: Loreli Dollar, MD, <Dictator> Loreli Dollar MD ELECTRONICALLY SIGNED 01/18/2012 15:08

## 2015-04-08 NOTE — Consult Note (Signed)
No change in swelling or painreviewed.  C/W loculated hydrocele on the rightloculated infected fluid from peritoneumcontinue the Cipro for now.no improvment in 24 hrs, will consider hydrocelectomy and drain placementre-evaluate tomorrow to make the decision.  Electronic Signatures: Murrell Redden (MD)  (Signed on 05-Feb-13 15:18)  Authored  Last Updated: 05-Feb-13 15:18 by Murrell Redden (MD)

## 2015-04-08 NOTE — Consult Note (Signed)
Pt tol diet well without pain, nausea, vomiting.  Will advance to mechanical soft and plan repeat EGD in 6-8 weeks. Discussed proper diet for when he goes home. He was seen in his room.  Electronic Signatures: Manya Silvas (MD)  (Signed on 13-Feb-13 16:51)  Authored  Last Updated: 13-Feb-13 16:51 by Manya Silvas (MD)

## 2015-04-08 NOTE — Consult Note (Signed)
PATIENT NAME:  [name], [name] MR#:  [id number] DATE OF BIRTH:  May 02, 1946  DATE OF CONSULTATION:  01/18/2012  REFERRING PHYSICIAN:   CONSULTING PHYSICIAN:  Manya Silvas, MD  HISTORY OF PRESENT ILLNESS: The patient is a 69 year old white male who had gallbladder surgery, laparoscopically, on 12/29/2011. Cholangiogram was normal. The diagnosis was chronic cholecystitis. He developed a flulike illness after surgery. Last week he had an abscess in the navel area. This was drained and cultured, and he was treated with ampicillin. He presented to the hospital on 01/16/2012 with a fever of 101, weakness, and tenderness in the right upper quadrant. Ultrasound showed a large fluid collection. He was admitted to the hospital and started on IV Zosyn and insulin sliding scale. He underwent abscess drainage under CAT scan guidance. Per Dr. Wendee Copp Hashmi's note, about 200 mL of pus exudate came out and then bile. Currently the drain is draining what appears to be just bile.   The patient has had a Roux-en-Y bypass surgery for weight loss due to obesity. He also has diabetes and hypertension. With the Roux-en-Y he has lost about 85 pounds.   PAST MEDICAL HISTORY:  1. Left kidney removal due to kidney cancer in 2004.  2. Roux-en-Y bypass surgery.  3. Meniscus surgery on right knee some years ago.  FAMILY HISTORY: Both parents had heart disease and died from heart disease.   HABITS: The patient has smoked a pack a day for 40 years. He does not drink alcohol   ALLERGIES: No known drug allergies.   ADMISSION MEDICATIONS: Paxil.  REVIEW OF SYSTEMS: He complains of some reflux, severe for the last few days, and has taken p.r.n. Nexium in the hospital. He is currently on Zantac. He has no known cardiac disease. He has never had blood clots before. He denies any current GU complaints. Overall he feels considerably better since drainage of his abscess.    PHYSICAL EXAMINATION:   VITAL SIGNS: Temperature  98, pulse 78, respirations 18, and blood pressure 115/70.  The patient did have a T-max up to 102 yesterday.  GENERAL: He is a white male who looks about stated age, mildly obese, lying in bed with a drain in the right abdomen draining bile-colored fluid.  HEENT: Sclerae anicteric. Conjunctivae negative. Tongue negative. Head is atraumatic. Trachea is in the midline.   LUNGS: Chest is clear in anterolateral fields.   HEART: No murmurs or gallops I can hear.   ABDOMEN: Abdomen has a dressing in the umbilical area and one in the right mid abdomen, mid upper, where the biliary drainage is. A few bowel sounds are present. No peritoneal signs. No masses palpable.   EXTREMITIES: No edema.   SKIN: Warm and dry.   PSYCHIATRIC: Mood and affect are appropriate. The patient was interviewed with several family members present.   LABS/STUDIES: Total protein 7.1, albumin 2.1, total bilirubin 0.2, alkaline phosphatase 157, SGOT 29, SGPT 29. White count 11.4, hemoglobin 11, hematocrit 32.8, platelet count 377. PT 14.8.  Culture of the fluid drained shows moderate growth of gram-negative rods, heavy growth of unidentified organisms, grossly bloody fluid.   ASSESSMENT:  1. Abscess after gallbladder surgery, possible gallbladder leak.  2. Small paraumbilical abscess, healing. 3. Diabetes, improved after bypass surgery.   PLAN:  1. The patient is to get a MRCP later today. He has no metal in his body. Because he has had a Roux-en-Y, it may be difficult or impossible to do an endoscopic procedure to evaluate the biliary  tree. If the patient is transferred to a medical center, he would prefer to go to [facility]. I will consult with Dr. Verdie Shire in the morning about possible avenues from here.  2. We will place compression devices for enhanced deep vein thrombosis prevention.  ____________________________ Manya Silvas, MD rte:slb D: 01/18/2012 15:05:09 ET T: 01/18/2012 15:45:28  ET JOB#: [id number]  cc: Manya Silvas, MD, <Dictator> J. Rochel Brome, MD Masud S. Phylis Bougie, MD Lupita Dawn. Candace Cruise, MD Manya Silvas MD ELECTRONICALLY SIGNED 01/29/2012 17:13

## 2015-04-08 NOTE — Consult Note (Signed)
Chief Complaint:   Subjective/Chief Complaint feeling better, much less abdominal pain. tolerating full liquid.   VITAL SIGNS/ANCILLARY NOTES: **Vital Signs.:   08-Feb-13 17:27   Vital Signs Type Q 4hr   Temperature Temperature (F) 98.3   Celsius 36.8   Temperature Source axillary   Pulse Pulse 83   Pulse source per Dinamap   Respirations Respirations 18   Systolic BP Systolic BP 601   Diastolic BP (mmHg) Diastolic BP (mmHg) 65   Mean BP 78   BP Source Dinamap   Pulse Ox % Pulse Ox % 94   Pulse Ox Activity Level  At rest   Oxygen Delivery Room Air/ 21 %  *Intake and Output.:   Daily 08-Feb-13 07:00   Other Output ml     Out:  170    17:41   Other Output ml     Out:  60    Shift 23:00   Other Output ml     Out:  60   Brief Assessment:   Cardiac Regular    Respiratory clear BS    Gastrointestinal details normal Soft  Nondistended  Bowel sounds normal  No rebound tenderness  mild tenderness at drain site   Routine Chem:  08-Feb-13 04:06    Creatinine (comp) 1.31   eGFR (African American) >60   eGFR (Non-African American) 59  Blood Glucose:  08-Feb-13 07:17    POCT Blood Glucose 142    11:54    POCT Blood Glucose 178    16:31    POCT Blood Glucose 112    20:04    POCT Blood Glucose 136   Assessment/Plan:  Assessment/Plan:   Assessment 1) gastric/anastomotic ulcers-stable 2) bile duct leak s/p ccy-stable improving    Plan 1) continue ppi as current. following until Dr Vira Agar returns.  will recheck hgb in am.   Electronic Signatures: Loistine Simas (MD)  (Signed 08-Feb-13 22:02)  Authored: Chief Complaint, VITAL SIGNS/ANCILLARY NOTES, Brief Assessment, Lab Results, Assessment/Plan   Last Updated: 08-Feb-13 22:02 by Loistine Simas (MD)

## 2015-04-08 NOTE — Consult Note (Signed)
Hgb 8.5, WBC 10, pt eating full liquids.  Dr. Gustavo Lah recommends repeat EGD.  I discussed with patient and wife.  Would like to do EGD before advancing diet to make sure ulcers doing ok.  Chest clear today on my exam.  Plan EGD tomorrow.  CT scan results noted.  Electronic Signatures: Manya Silvas (MD)  (Signed on 11-Feb-13 18:13)  Authored  Last Updated: 11-Feb-13 18:13 by Manya Silvas (MD)

## 2015-04-08 NOTE — Consult Note (Signed)
Pt doing reasonably welldrain output from scrotum.edema, less erythema, wound intactIV abx for nowStoioff will round tomorrow.shoot for drain removal in 48 hrs.gauze q shift and as needed.  Electronic Signatures: Murrell Redden (MD)  (Signed on 07-Feb-13 09:11)  Authored  Last Updated: 07-Feb-13 09:11 by Murrell Redden (MD)

## 2015-04-08 NOTE — Consult Note (Signed)
Pt seen, Chart Reviewed, Films Reviewed, Note Dictated  Right Epididymorchitis  Will order scrotal U/S for confirmation of findings and to rule out abscess.  Will likely have some degree of reactionary hydrocele. A scall peritoneal communication to the tunica could also be present. Zosyn will have some testicle effect.  Cipro will likely have more penetration to the testicle.  Will start Cipro 400mg  IV q12.  WIll review U/S and make any further recommendations.  Will follow.  Electronic Signatures: Murrell Redden (MD)  (Signed on 04-Feb-13 16:56)  Authored  Last Updated: 04-Feb-13 16:56 by Murrell Redden (MD)

## 2015-04-08 NOTE — Consult Note (Signed)
Chief Complaint:   Subjective/Chief Complaint abdominal pain is diffuse but less marked   VITAL SIGNS/ANCILLARY NOTES: **Vital Signs.:   03-Feb-13 05:53   Vital Signs Type Routine   Temperature Temperature (F) 98.4   Celsius 36.8   Temperature Source oral   Pulse Pulse 85   Pulse source per Dinamap   Respirations Respirations 18   Systolic BP Systolic BP 643   Diastolic BP (mmHg) Diastolic BP (mmHg) 66   Mean BP 82   BP Source Dinamap   Pulse Ox % Pulse Ox % 97   Pulse Ox Activity Level  At rest   Oxygen Delivery 2L    09:13   Vital Signs Type Q 4hr   Temperature Temperature (F) 98.2   Celsius 36.7   Temperature Source oral   Pulse Pulse 80   Pulse source per Dinamap   Respirations Respirations 18   Systolic BP Systolic BP 329   Diastolic BP (mmHg) Diastolic BP (mmHg) 72   Mean BP 89   BP Source Dinamap   Pulse Ox % Pulse Ox % 93   Pulse Ox Activity Level  At rest   Oxygen Delivery Room Air/ 21 %   Brief Assessment:   Cardiac Regular    Respiratory normal resp effort    Additional Physical Exam pt is less tender than yesterday abdomen is soft but diffusely tender .   Hepatic:  01-Feb-13 18:26    Bilirubin, Total 0.2   Bilirubin, Direct < 0.1   Alkaline Phosphatase 157   SGPT (ALT) 29   SGOT (AST) 29   Total Protein, Serum 7.1   Albumin, Serum 2.1  Routine Chem:  01-Feb-13 18:26    Creatinine (comp) 0.99   eGFR (African American) >60   eGFR (Non-African American) >60  Routine Hem:  02-Feb-13 09:31    WBC (CBC) 11.4   RBC (CBC) 3.85   Hemoglobin (CBC) 11.0   Hematocrit (CBC) 32.8   Platelet Count (CBC) 377   MCV 85   MCH 28.5   MCHC 33.6   RDW 13.0   Neutrophil % 88.7   Lymphocyte % 5.4   Monocyte % 5.2   Eosinophil % 0.3   Basophil % 0.4   Neutrophil # 10.2   Lymphocyte # 0.6   Monocyte # 0.6   Eosinophil # 0.0   Basophil # 0.0  Routine Coag:  02-Feb-13 09:31    Prothrombin 14.8   INR 1.1   Activated PTT (APTT) 34.5   Radiology  Results: CT:    02-Feb-13 08:29, CT Abdomen With Contrast   CT Abdomen With Contrast    REASON FOR EXAM:    RUQ fluid collection,  CT guided abscess drainage   with catheter  COMMENTS:       PROCEDURE: CT  - CT ABDOMEN STANDARD W  - Jan 17 2012  8:29AM     RESULT: History: Right or quadrant fluid collection    Comparison: None    Technique: Multiple axial images of the abdomen were performed from the   lung bases to the iliac crests, with p.o. contrast and with 1m of   Isovue 300 intravenous contrast.      Findings:    The lung bases are clear. There is no pneumothorax. The heart size is   normal.     The liver demonstrates no focal abnormality. There is no intrahepatic or   extrahepatic biliary ductal dilatation. The gallbladder is surgically   absent. In the gallbladder fossae  there is a 9.3 x 7.4 cm complex fluid   collection concerning for abscess or biloma. There are postsurgical   changes in the region of the umbilicus with a small loculate air present.   The spleen demonstrates no focal abnormality. The right kidney, adrenal   glands, and pancreas are normal. The left kidney is surgically absent.    The visualized portions of the stomach, duodenum, small intestine, and   large intestine demonstrate no contrast extravasation or dilatation.     There is no pneumoperitoneum, pneumatosis, or portal venous gas. Thereis   no abdominal free fluid. There is no lymphadenopathy.     The abdominal aorta is normal in caliber with atherosclerosis.    There is lumbar spine spondylosis.    IMPRESSION:     The gallbladder is surgically absent. In the gallbladder fossae there is   a 9.3 x 7.4 cm complex fluid collection concerning for abscess or biloma.          Verified By: Jennette Banker, M.D., MD    02-Feb-13 11:57, CT Guide for Abscess Drainage (Specify)   CT Guide for Abscess Drainage (Specify)    REASON FOR EXAM:    abscess, fever, pain  COMMENTS:        PROCEDURE: CT  - CT GUIDED ABSCESS DRAINAGE  - Jan 17 2012 11:57AM     RESULT: CT-Guided Percutaneous Abscess Drainage       Indication: Gallbladder fossa complex fluid collection. Percutaneous   drainage is requested.    Comparisons: None    Procedure:     Clinical assessment was performed and informed consent obtained.  The     patient was brought to the CT suite and placed supine on the table. A   focused abdominal CT without contrast was performed.     There is a large complex fluid collection in the gallbladder fossa. The   collection was deemed amenable to drainage.    The right upper quadrant was prepped and draped in the usual sterile   fashion. The overlying skin was anesthetized with 1% lidocaine. A 21   gauge micropuncture needle was inserted and the location confirmed by CT   fluoroscopy. The needle was advanced and placement within the collection   confirmed by CT and syringe aspiration of purulent material which was   sent for gram stain and culture.    The 21 gauge needle was exchanged for a 5 [place] dilator over a wire. The   dilator was then exchanged for a 10.2 [place] transitional dilator     followed by placement of an 8 [place] APDL pigtail catheter into the fluid   collection. 200 mL of area bile tinged fluid was aspirated from the fluid   collection. The catheter was secured to the skin using a StatLock device,   dressed, and connected to a drainage bag.    The procedure was well tolerated and without complication. Hemostasis was   achieved. The patient was transferred to the patient's room in stable   condition.    IMPRESSION:     Successful CT-guided placement of a 10.2 [place] APDL pigtail catheter   into a gallbladder fossa fluid collection.        Verified By: Jennette Banker, M.D., MD   Assessment/Plan:  Assessment/Plan:   Assessment pt does have biliary leak and following things could be the reason 1-Duct of Lushka 2-cystic duct  stump leak 3- less likely CBD injury    Plan will  do MRCP today and possible hida scan to detect the leak. This pt mostprobably will not be able to have ERCP due to gastric by pass surgery will monitor patient today and collect the data for bile leak if that gets less and less then probably do nothing and watch him .He is being covered by the antibiotics   Electronic Signatures: Vella Kohler (MD)  (Signed 03-Feb-13 11:15)  Authored: Chief Complaint, VITAL SIGNS/ANCILLARY NOTES, Brief Assessment, Lab Results, Radiology Results, Assessment/Plan   Last Updated: 03-Feb-13 11:15 by Vella Kohler (MD)

## 2015-04-08 NOTE — Consult Note (Signed)
PATIENT NAME:  [name], [name] MR#:  [id number] DATE OF BIRTH:  March 26, 1946  DATE OF CONSULTATION:  01/19/2012  REFERRING PHYSICIAN:  Vella Kohler, MD  CONSULTING PHYSICIAN:  A. Lavone Orn, MD  CHIEF COMPLAINT: Uncontrolled diabetes.   HISTORY OF PRESENT ILLNESS: This is a 69 year old male who was admitted on 01/16/2012 by General Surgery due to concern of abdominal abscess. He had undergone a laparoscopic cholecystectomy on 12/29/2011. He has undergone procedures since hospitalization including placement of a drainage catheter into the right upper quadrant abscess cavity and has been receiving IV antibiotics. He is currently n.p.o. after having undergone a HIDA scan earlier today. Blood sugars have been persistently high during the hospitalizatio in the range of 280 - 460. He is currently being managed with a Novolin R sliding scale.   Outpatient records were reviewed. He has a history of type II diabetes, however, after undergoing gastric bypass surgery in 2010 he believed his diabetes had resolved. He had lost 85 to 90 pounds as a result of his bypass surgery. Prior to bypass surgery, he was taking a combination of once daily Lantus 60 units, Byetta 10 mcg twice daily, metformin 1000 mg in the morning and 1500 mg in the evening, and Actos 30 mg daily. Most recent hemoglobin A1c from August 2012 was 6.7%.   PAST MEDICAL HISTORY:  1. History of obesity, status post gastric bypass surgery.  2. History of diabetes mellitus.  3. Hyperlipidemia.  4. History of chronic obstructive pulmonary disease.  5. Sleep apnea.  6. History of renal cell carcinoma, status post left nephrectomy 2004.  7. Depressive disorder.  8. Hypertension.   PAST SURGICAL HISTORY:  1. Gastric bypass, 2010.  2. Left nephrectomy, 2004.  3. Cholecystectomy, 12/29/2011.   OUTPATIENT MEDICATIONS: Paxil 40 mg daily.   CURRENT INPATIENT MEDICATIONS:  1. Protonix 40 mg IV daily.  2. Paxil 40 mg daily.  3. Zosyn 3.375 IV  q.8 hours.  4. Zantac 150 mg b.i.d.  5. Novolin R sliding scale.  6. Ciprofloxacin 400 mg IV q.12 hours.  7. Normal saline 0.45% plus KCL 20 mEq at 125 mL/h.   ALLERGIES: No known drug allergies.   SOCIAL HISTORY: The patient is married. He has one child. He smokes three-quarters of a pack of cigarettes per day. He denies alcohol use.   FAMILY HISTORY: Positive for diabetes, heart disease, hypertension, and breast cancer.   REVIEW OF SYSTEMS: HEENT: No blurred vision. No sore throat. NECK: No neck pain. No dysphasia. GENERAL: Appetite is good. No fevers in the last two days. CARDIAC: No chest pain. No palpitation. ABDOMEN: Reports abdominal discomfort diffusely. No nausea. Complains of heartburn. EXTREMITIES: Denies leg swelling. SKIN: Denies rash or pruritus. ENDOCRINE: Denies heat or cold intolerance. HEME: Denies easy bruisability or recent bleeding.   PHYSICAL EXAMINATION:   VITAL SIGNS: Temperature 98.1, pulse 112, blood pressure 98/66, pulse oximetry 96% on room air.   GENERAL: Well developed, well nourished white male in no acute distress.   HEENT: Extraocular movements are intact. Oropharynx is clear. Mucous membranes moist.   NECK: Supple. No thyromegaly.   CARDIAC: Regular rate and rhythm. No murmur.   PULMONARY: Clear bilaterally. No wheeze. Good inspiratory effort.   ABDOMEN: Diffuse tenderness. Drain in place with yellowish serous fluid, nonbloody.   EXTREMITIES: No edema is present.   MUSCULOSKELETAL: Moves all extremities. Normal motor tone.   PSYCHIATRIC: Alert and oriented x3, calm, cooperative.   ASSESSMENT: This is a 69 year old male with history of diabetes  currently with uncontrolled blood sugars likely due in part to underlying infection.   RECOMMENDATIONS: Initiate basal-prandial insulin. Will start with Lantus 30 units at bedtime and NovoLog 15 units q.a.c. in addition to a NovoLog sliding scale of 2 units per 50 over a target of 150.   Thank you for  the kind request for consultation. I will follow along with you.   ____________________________ A. Lavone Orn, MD ams:drc D: 01/19/2012 17:04:00 ET T: 01/20/2012 07:48:23 ET JOB#: [id number]  cc: A. Lavone Orn, MD, <Dictator> Sherlon Handing MD ELECTRONICALLY SIGNED 01/22/2012 20:33

## 2015-04-08 NOTE — Consult Note (Signed)
feeling betteroutput 52mL past 24 hrs. minimal drainage at presentmoderate edema; mimimal erythemaremovednegantibioticlocums urologist if any problems over weekend- will check out to covering  Electronic Signatures: Abbie Sons (MD)  (Signed on 08-Feb-13 12:24)  Authored  Last Updated: 08-Feb-13 12:24 by Abbie Sons (MD)

## 2015-04-08 NOTE — Consult Note (Signed)
No complaints; denies scrotal paindrain removed earlier todayScrotal erythema resolved, much less scrotal edema. Doing well status post hydrocelectomy/drainage of pyocele.Follow up office visit in one month  Electronic Signatures: Abbie Sons (MD)  (Signed on 12-Feb-13 17:04)  Authored  Last Updated: 12-Feb-13 17:04 by Abbie Sons (MD)

## 2015-04-08 NOTE — Discharge Summary (Signed)
PATIENT NAME:  [name], [name] MR#:  [id number] DATE OF BIRTH:  1946/08/07  DATE OF ADMISSION:  01/16/2012 DATE OF DISCHARGE:  01/29/2012  ADMITTING PHYSICIAN: Rochel Brome, MD   CONSULTING PHYSICIANS:  1. Edrick Oh, MD  2. Gaylyn Cheers, MD  3. Lucilla Lame, MD   PRIMARY CARE PHYSICIAN: Apolonio Schneiders, MD   ADMISSION DIAGNOSIS: Intraabdominal abscess.  SECONDARY DIAGNOSES: 1. Diabetes.  2. Hyperlipidemia.  3. Hypertension. 4. Depression.   DISCHARGE DIAGNOSES: 1. Intraabdominal abscess.  2. Diabetes. 3. Hyperlipidemia. 4. Hypertension. 5. Depression.  6. Scrotal abscess. 7. Gastric ulcers.   HISTORY OF PRESENT ILLNESS: This is a 69 year old male who had laparoscopic cholecystectomy on January 14th. Postoperatively he had a flulike illness. He then presented 10 days after surgery with an abscess at the umbilicus. Culture was positive for Enterococcus treated with ampicillin. At a follow-up visit on February 1st, he continued to have fever to 101 with feeling weak and fatigued. Labs and ultrasound were ordered that day showing a white blood cell count of 14.9 and right upper quadrant abscess found on ultrasound. The patient was admitted to the hospital for further management.   HOSPITAL COURSE: The patient was admitted and started on Zosyn. He had CT-guided drainage and catheter placement done on February 2nd. This showed some purulent drainage initially that was cultured. In the next couple of days it had bilious drainage, at times a significant amount. The patient was continued on the Zosyn throughout the hospital stay. The biliary drainage tube was kept in until February 12th. At that point the drainage had stopped. CT scan showed significant decrease in the size of the abscess. The patient was afebrile with a normal white count.   The patient also complained of scrotal pain on admission. Examination showed an enlarged edematous scrotum which worsened over the first couple of days  to where it became too painful for him to walk. He had Urology consult with Dr. Jacqlyn Larsen. An ultrasound showed a large collection in the scrotum suspicious for abscess. On February 6th, the patient underwent incision and drainage of this abscess with a drain left in for a few days. This gave him significant relief. He was started on Cipro as well to get better penetration of the scrotum. This abscess was thought to be connected to the intraabdominal abscess.   The patient had an episode of hematemesis on February 4th. Hemoglobin was 6.9 which was a significant drop from admission. The next day he also noted some bright red blood per rectum after an enema. Dr. Vira Agar did an upper endoscopy with findings of several large nonbleeding gastric ulcers. He was started on Carafate and high dose PPI. His diet was slowly advanced to a soft diet. He had repeat endoscopy on 02/12 which showed that the ulcers were healing.   The patient also had significant hyperglycemia on admission blood. Sugars were over 400 at one point. Dr. Gabriel Carina was consulted and started him on long-acting and short-acting premeal insulin. She readjusted his diabetes regimen prior to discharge.   On the day of discharge, the patient had had all drains removed. Pain was controlled with oral pain medication. He was tolerating his diet. Hemoglobin was stable. Blood sugars were much better controlled. Abdomen was soft, minimal tenderness, drain sites dry.   DISPOSITION: Discharged home with self-care.   MEDICATIONS:  1. Prevacid SoluTab 30 mg melt in mouth 2 times per day. 2. Glipizide 5 mg 2 times per day,  3. Ampicillin 500 mg 4 times  per day for seven days. 4. Tylenol as needed for pain.   DISCHARGE INSTRUCTIONS:  1. ADA diet.  2. Activity as tolerated.  3. Follow-up with Dr. Tamala Julian in two weeks.  4. Follow-up with Dr. Arline Asp in 2 to 3 weeks regarding diabetes. 5. Follow-up with Dr. Vira Agar in six weeks.  6. Follow up with Dr. Jacqlyn Larsen in 3 to 4  weeks.  7. Seek medical attention if you develop any increasing abdominal pain, nausea, vomiting, fever, or other concerns.   ____________________________ Celene Squibb. Theda Sers, [place] amc:drc D: 01/29/2012 15:44:38 ET T: 01/30/2012 12:10:15 ET JOB#: [id number]  cc: Celene Squibb. Theda Sers, [place], <Dictator> French Kendra M Debbrah Sampedro PA ELECTRONICALLY SIGNED 01/30/2012 14:11

## 2015-04-08 NOTE — Consult Note (Signed)
Chief Complaint:   Subjective/Chief Complaint feeling good, denies abdominal pain or nausea.  tolerating current diet.   VITAL SIGNS/ANCILLARY NOTES: **Vital Signs.:   09-Feb-13 14:30   Vital Signs Type Routine   Temperature Temperature (F) 98.2   Celsius 36.7   Temperature Source oral   Pulse Pulse 81   Pulse source per Dinamap   Respirations Respirations 19   Systolic BP Systolic BP [id number]   Diastolic BP (mmHg) Diastolic BP (mmHg) 78   Mean BP 93   BP Source Dinamap   Pulse Ox % Pulse Ox % 96   Pulse Ox Activity Level  At rest   Oxygen Delivery Room Air/ 21 %   Brief Assessment:   Cardiac Regular    Respiratory clear BS    Gastrointestinal details normal Soft  Nondistended  No masses palpable  Bowel sounds normal  No rebound tenderness  mild tender around the drain site.     Routine Hem:  09-Feb-13 04:18    WBC (CBC) 10.6   RBC (CBC) 2.72   Hemoglobin (CBC) 7.8   Hematocrit (CBC) 23.6   Platelet Count (CBC) 461   MCV 87   MCH 28.7   MCHC 33.1   RDW 15.2   Neutrophil % 77.6   Lymphocyte % 14.0   Monocyte % 5.8   Eosinophil % 2.2   Basophil % 0.4   Neutrophil # 8.2   Lymphocyte # 1.5   Monocyte # 0.6   Eosinophil # 0.2   Basophil # 0.0  Blood Glucose:  09-Feb-13 07:22    POCT Blood Glucose 145    11:40    POCT Blood Glucose 141    16:07    POCT Blood Glucose 133   Assessment/Plan:  Assessment/Plan:   Assessment 1) esophageal /gastric anastomotic ulcers, likely cause of upper gi bleeding.  2) bile duct leak s/p ccy-minimal o/p of drain.  no peritoneal signs    Plan 1) continue protonix bid.  With his gastric bypass, it may not be well absorbed po and may need to change to po prevacid SOLUTAB 30 mg bid.  Consider adding carafate one gram po qid.  will check cbc aand lfts in am.   Electronic Signatures for Addendum Section:  Loistine Simas (MD) (Signed Addendum 09-Feb-13 19:25)  patient did report one soft dark stool today, not recorded.    Electronic Signatures: Loistine Simas (MD)  (Signed 09-Feb-13 19:24)  Authored: Chief Complaint, VITAL SIGNS/ANCILLARY NOTES, Brief Assessment, Lab Results, Assessment/Plan   Last Updated: 09-Feb-13 19:25 by Loistine Simas (MD)

## 2015-04-08 NOTE — Consult Note (Signed)
Pt last vomiting of blood was 2 days ago.  He walked around nurse station 6 times today.  Passed a little blood, flatus smells melanotic.  No prior hx of ulcer disease. His scrotum is erythematous and very tender.  [place] of testicles showed cys, no tortion.  May need urology consult.  Hgb 7.6.  Will await 4pm hgb and consider transfusion if below 7.  Electronic Signatures: Manya Silvas (MD)  (Signed on 05-Feb-13 14:19)  Authored  Last Updated: 05-Feb-13 14:19 by Manya Silvas (MD)

## 2015-04-08 NOTE — Consult Note (Signed)
PATIENT NAME:  [name], TOOLE MR#:  [id number] DATE OF BIRTH:  1946/05/10  DATE OF CONSULTATION:  01/19/2012  REFERRING PHYSICIAN:   CONSULTING PHYSICIAN:  Denice Bors. Jacqlyn Larsen, MD  HISTORY: Mr. Stenger is a 69 year old gentleman who underwent laparoscopic cholecystectomy approximately three weeks ago. He developed a flulike illness after surgery. He then developed an abscess in the navel region. He developed progressive tenderness in the right upper quadrant. An ultrasound demonstrated a large fluid collection. He was admitted for CT-guided drainage. He was found to have a bile leak with a large amount of bile drained from the site. A drain is currently in place. The culture is growing Enterococcus and Escherichia coli. He is currently on Zosyn for coverage. He has also been noted to have significant swelling and discomfort in his right testicle. He stated this started on Saturday around the time of his CT-guided drainage. He attributes the CT-guided drainage to the onset of the swelling and discomfort. This is likely unrelated. On examination, he has findings consistent with probable epididymal orchitis. A possible communication between the peritoneal cavity and the scrotum must also be considered. Further evaluation with a scrotal ultrasound is recommended. He has no other prior history of testicular trauma or infections. He states that he voids with a reasonable stream. He denies any dysuria or hematuria. He denies any prior history of urinary tract infections or prostatitis. He does relate a past history of renal cell carcinoma. He underwent surgical excision in 2004 with no evidence of recurrence. He denies any other significant prior urological history.   PAST MEDICAL HISTORY:  1. Obesity. 2. Depression. 3. Renal cell carcinoma.   PAST SURGICAL HISTORY:  1. Left radical nephrectomy in 2004.  2. Roux-en-Y gastric bypass. 3. Meniscus surgery on his right knee.   SOCIAL HISTORY: The patient has a  long history of tobacco use. He denies any significant alcohol or drug use.   ALLERGIES: No known drug allergies.   MEDICATIONS ON ADMISSION: Paxil.   PHYSICAL EXAMINATION:   VITAL SIGNS: Vital signs stable.   HEENT: Within normal limits.   CHEST: Clear to auscultation bilaterally.   CARDIOVASCULAR: Regular rate and rhythm.   ABDOMEN: Soft. Tender in the right upper quadrant. No evidence of rebound tenderness. Drain in place in the right upper quadrant draining bile-colored fluid.   GU: External genitalia demonstrates a normal phallus. The scrotum demonstrates mild erythema with significant fullness and enlargement of the right testicle. There may be some component of a hydrocele also present. There is no evidence of abscess. The left testicle is normal. Moderate tenderness is present of the scrotum.   EXTREMITIES: Free range of motion x4.   NEUROLOGIC: Motor and sensory grossly intact.   ASSESSMENT: Right epididymoorchitis.   RECOMMENDATION: Further evaluation with a scrotal ultrasound is recommended. This has been scheduled for early tomorrow. I will review the ultrasound and make further recommendations if indicated. Zosyn does appear to be appropriate for the cultures, however, the penetration in the testicle may be limited. I will, therefore, add Cipro 400 mg IV q.12 for better testicle coverage. Maintaining elevation of the scrotum is suggested. Tighter fitting underclothes may also be reasonable. I will follow with you and make further recommendations as indicated.    ____________________________ Denice Bors. Jacqlyn Larsen, MD bsc:drc D: 01/19/2012 18:55:29 ET T: 01/20/2012 09:31:58 ET JOB#: [id number] cc: Denice Bors. Jacqlyn Larsen, MD, <Dictator> Denice Bors Alma Muegge MD ELECTRONICALLY SIGNED 01/20/2012 14:22

## 2015-04-08 NOTE — Consult Note (Signed)
Pt feeling better, WBC 13.9, hgb 8.3, eating full liq diet well. Chest clear, faint inconsistent wheeze on left base.  Drain still present, amount decreasing.  Had urological procedure yesterday.  2 large ulcers at anastamosis on EGD, no stigmata suggesting high risk for rebleed.  No bowel movement today. Will start carafate slurry to help with ulcer.  Due to small stomach he should NOT be on carafate pills.  Unless they are dissolved in water first.  He is on high dose iv Protonix and will remain on that until discharge when he should be on bid therapy.  Will do EGD in 6-8 weeks unless needed eariler.  Dr. Candace Cruise will see this weekend.  Electronic Signatures: Manya Silvas (MD)  (Signed on 07-Feb-13 13:35)  Authored  Last Updated: 07-Feb-13 13:35 by Manya Silvas (MD)

## 2015-04-08 NOTE — Consult Note (Signed)
Chief Complaint:   Subjective/Chief Complaint Right pyocele s/p right hydrocelectomy 01/21/12 Patient has no complaints this morning- ambulating and reports minimal scrotal pain. Denies fever or chills   VITAL SIGNS/ANCILLARY NOTES: **Vital Signs.:   09-Feb-13 05:00   Vital Signs Type Routine   Temperature Temperature (F) 97.6   Celsius 36.4   Temperature Source oral   Pulse Pulse 65   Pulse source per Dinamap   Respirations Respirations 18   Systolic BP Systolic BP [id number]   Diastolic BP (mmHg) Diastolic BP (mmHg) 69   Mean BP 84   BP Source Dinamap   Pulse Ox % Pulse Ox % 96   Pulse Ox Activity Level  At rest   Oxygen Delivery Room Air/ 21 %  *Intake and Output.:   Shift 09-Feb-13 07:00   Grand Totals Intake:   Output:  1660    Net:  -79 24 Hr.:  -2727   Urine ml     Out:  1650   Other Output ml     Out:  10   Length of Stay Totals Intake:  16737.59 Output:  22295    Net:  -5557.41   Brief Assessment:   Additional Physical Exam GU: induration right hemiscrotum c/w previous surgery.  no erythema or fluctuance.  mild tenderness. left testicle wnl   Routine Hem:  09-Feb-13 04:18    WBC (CBC) 10.6   RBC (CBC) 2.72   Hemoglobin (CBC) 7.8   Hematocrit (CBC) 23.6   Platelet Count (CBC) 461   MCV 87   MCH 28.7   MCHC 33.1   RDW 15.2   Neutrophil % 77.6   Lymphocyte % 14.0   Monocyte % 5.8   Eosinophil % 2.2   Basophil % 0.4   Neutrophil # 8.2   Lymphocyte # 1.5   Monocyte # 0.6   Eosinophil # 0.2   Basophil # 0.0   Assessment/Plan:  Assessment/Plan:   Assessment Right hydrocelectomy on 01/21/12 for infected right hydrocele.  Patient doing well with no complaints.  WBC normal and physical findings c/w surgical intervention. Wound cx NGTD x 3 days    Plan - Continue scrotal support - Continue abx - Call with questions   Electronic Signatures: Margy Clarks (MD)  (Signed 09-Feb-13 11:27)  Authored: Chief Complaint, VITAL SIGNS/ANCILLARY NOTES, Brief  Assessment, Lab Results, Assessment/Plan   Last Updated: 09-Feb-13 11:27 by Margy Clarks (MD)

## 2015-04-08 NOTE — Consult Note (Signed)
Chief Complaint:   Subjective/Chief Complaint pain ruq   VITAL SIGNS/ANCILLARY NOTES: **Vital Signs.:   02-Feb-13 01:09   Vital Signs Type Q 4hr   Temperature Temperature (F) 98.2   Celsius 36.7   Temperature Source oral   Pulse Pulse 69   Pulse source per Dinamap   Respirations Respirations 18   Systolic BP Systolic BP 400   Diastolic BP (mmHg) Diastolic BP (mmHg) 73   Mean BP 91   BP Source Dinamap   Pulse Ox % Pulse Ox % 96   Pulse Ox Activity Level  At rest   Oxygen Delivery Room Air/ 21 %    05:14   Vital Signs Type Routine   Temperature Temperature (F) 99   Celsius 37.2   Temperature Source oral   Pulse Pulse 84   Pulse source per Dinamap   Respirations Respirations 18   Systolic BP Systolic BP 867   Diastolic BP (mmHg) Diastolic BP (mmHg) 79   Mean BP 105   BP Source Dinamap   Pulse Ox % Pulse Ox % 94   Pulse Ox Activity Level  At rest   Oxygen Delivery Room Air/ 21 %  *Intake and Output.:   02-Feb-13 06:07   Grand Totals Intake:  1110 Output:      Net:  1110 24 Hr.:  1590   IV (Primary)      In:  1110    Shift 07:00   Grand Totals Intake:  1110 Output:      Net:  1110 24 Hr.:  1590   IV (Primary)      In:  1110   Length of Stay Totals Intake:  1590 Output:      Net:  1590    Daily 07:00   Grand Totals Intake:  1590 Output:      Net:  6195 09 Hr.:  1590   Oral Intake      In:  480   IV (Primary)      In:  1110   Length of Stay Totals Intake:  1590 Output:      Net:  1590   Brief Assessment:   Cardiac Regular    Respiratory normal resp effort    Gastrointestinal details abnormal Tender....  Bowel sounds hypoactive    Tenderness RUQ  Difuse    Additional Physical Exam pt just had drainage catheter put in the abcess cavity and 200 ccof frank puss removed and cultures taken now mostly biliary drainage from the catheter.   Hepatic:  01-Feb-13 18:26    Bilirubin, Total 0.2   Bilirubin, Direct < 0.1   Alkaline Phosphatase 157   SGPT  (ALT) 29   SGOT (AST) 29   Total Protein, Serum 7.1   Albumin, Serum 2.1  Routine Chem:  01-Feb-13 18:26    Creatinine (comp) 0.99   eGFR (African American) >60   eGFR (Non-African American) >60  Routine Hem:  02-Feb-13 09:31    WBC (CBC) 11.4   RBC (CBC) 3.85   Hemoglobin (CBC) 11.0   Hematocrit (CBC) 32.8   Platelet Count (CBC) 377   MCV 85   MCH 28.5   MCHC 33.6   RDW 13.0   Neutrophil % 88.7   Lymphocyte % 5.4   Monocyte % 5.2   Eosinophil % 0.3   Basophil % 0.4   Neutrophil # 10.2   Lymphocyte # 0.6   Monocyte # 0.6   Eosinophil # 0.0   Basophil # 0.0  Routine Coag:  02-Feb-13 09:31    Prothrombin 14.8   INR 1.1   Activated PTT (APTT) 34.5   Radiology Results: CT:    02-Feb-13 08:29, CT Abdomen With Contrast   CT Abdomen With Contrast    REASON FOR EXAM:    RUQ fluid collection,  CT guided abscess drainage   with catheter  COMMENTS:       PROCEDURE: CT  - CT ABDOMEN STANDARD W  - Jan 17 2012  8:29AM     RESULT: History: Right or quadrant fluid collection    Comparison: None    Technique: Multiple axial images of the abdomen were performed from the   lung bases to the iliac crests, with p.o. contrast and with 36m of   Isovue 300 intravenous contrast.      Findings:    The lung bases are clear. There is no pneumothorax. The heart size is   normal.     The liver demonstrates no focal abnormality. There is no intrahepatic or   extrahepatic biliary ductal dilatation. The gallbladder is surgically   absent. In the gallbladder fossae there is a 9.3 x 7.4 cm complex fluid   collection concerning for abscess or biloma. There are postsurgical   changes in the region of the umbilicus with a small loculate air present.   The spleen demonstrates no focal abnormality. The right kidney, adrenal   glands, and pancreas are normal. The left kidney is surgically absent.    The visualized portions of the stomach, duodenum, small intestine, and   large intestine  demonstrate no contrast extravasation or dilatation.     There is no pneumoperitoneum, pneumatosis, or portal venous gas. Thereis   no abdominal free fluid. There is no lymphadenopathy.     The abdominal aorta is normal in caliber with atherosclerosis.    There is lumbar spine spondylosis.    IMPRESSION:     The gallbladder is surgically absent. In the gallbladder fossae there is   a 9.3 x 7.4 cm complex fluid collection concerning for abscess or biloma.          Verified By: HJennette Banker M.D., MD   Assessment/Plan:  Invasive Device Daily Assessment of Necessity:   Does the patient currently have any of the following indwelling devices? none   Assessment/Plan:   Assessment pt has a post op abcess which has been drained and abcess cavity is being emptied .200 ml of puss removed    Plan pain ciontrol and watchful waitng .i thing that all he will need at this time   Electronic Signatures: HVella Kohler(MD)  (Signed 02-Feb-13 12:02)  Authored: Chief Complaint, VITAL SIGNS/ANCILLARY NOTES, Brief Assessment, Lab Results, Radiology Results, Assessment/Plan   Last Updated: 02-Feb-13 12:02 by HVella Kohler(MD)

## 2015-04-08 NOTE — Consult Note (Signed)
EGD done, ulcers more defined, no bleeding or stigmata.  Diet advanced a little.  drain pulled by Dr Tamala Julian.  Maybe home Thursday if does well.  Electronic Signatures: Manya Silvas (MD)  (Signed on 12-Feb-13 13:34)  Authored  Last Updated: 12-Feb-13 13:34 by Manya Silvas (MD)

## 2015-04-08 NOTE — Consult Note (Signed)
Some increase in edema and swellingpain and discomfort from the right hemiscrotumschedueld for Endoscopy later today.plan on Right hydrocelectomy late PM with drian placementdiscussed with patientobtained with patientagrees to proceed.  Electronic Signatures: Murrell Redden (MD)  (Signed on 06-Feb-13 08:08)  Authored  Last Updated: 06-Feb-13 08:08 by Murrell Redden (MD)

## 2015-04-08 NOTE — Op Note (Signed)
PATIENT NAME:  [name], [name] MR#:  [id number] DATE OF BIRTH:  1946/04/28  DATE OF PROCEDURE:  01/21/2012  PREOPERATIVE DIAGNOSIS: Infected loculated right hydrocele.   POSTOPERATIVE DIAGNOSIS: Infected loculated right hydrocele.   PROCEDURE: Right hydrocelectomy.   SURGEON: Denice Bors. Jacqlyn Larsen, MD  ANESTHESIA: General endotracheal anesthesia.   INDICATIONS: The patient is a 69 year old gentleman who underwent recent cholecystectomy. He developed a bile leak with abscess formation in the abdominal compartment. He underwent drainage of the peritoneal abscess. He developed progressive swelling and discomfort of the right testicle. Findings on ultrasound were consistent with a loculated hydrocele likely infectious in nature. He has had progressive worsening of the discomfort and swelling. He presents for hydrocelectomy and drainage.   DESCRIPTION OF PROCEDURE: After informed consent was obtained, the patient was taken to the Operating Room and placed in the supine position on the operating table under general endotracheal anesthesia. The patient was then prepped and draped in the usual standard fashion. A midline scrotal incision was made approximately 5 cm in length. The incision was continued down to the hydrocele sac. Extensive edema was noted within the overlying tissue. The hydrocele sac was noted to be very thickened. The sac was delivered from the right hemiscrotum. Multiple small veins were encountered. These were cauterized to minimize bleeding. The hydrocele sac was then opened with a large amount of milky yellow, purulent-appearing material drained. Multiple septations were present. A thick fibrinous exudate was noted over the entire internal portion of the sac and testicle proper. This was stripped free in its entirety. A large edematous appendix testis was identified. This was excised. A small portion of the hydrocele sac was excised. The sac was opened from its superior to inferior-most aspect.  The sac was then everted posterior to the testicle. It was secured with interrupted 2-0 chromic sutures. The testicle was returned to the hemiscrotal compartment. The testicle and sac was irrigated with ample amounts of saline solution. A 10 mm JP drain was then placed through an inferior stab incision in the right hemiscrotum. The JP drain was trimmed and placed in the posterior position within the hemiscrotum. The scrotum was then closed in two layers utilizing a running 2-0 chromic suture. The skin was closed utilizing a 3-0 chromic suture in a mattress stitch. The drain was secured to the skin utilizing a nylon suture. The bulb was then connected to the tubing. It was then charged. Collodion was applied to the incision site. The patient was then awakened from general endotracheal anesthesia, was taken to recovery room in stable condition. There were no problems or complications. The patient tolerated the procedure well. Estimated blood loss was minimal.   ____________________________ Denice Bors. Jacqlyn Larsen, MD bsc:cms D: 01/22/2012 17:20:11 ET T: 01/23/2012 07:28:03 ET JOB#: [id number]  cc: Denice Bors. Jacqlyn Larsen, MD, <Dictator> Denice Bors Ravneet Spilker MD ELECTRONICALLY SIGNED 01/29/2012 9:04

## 2015-04-15 NOTE — Op Note (Signed)
PATIENT NAME:  [name], [name] MR#:  [id number] DATE OF BIRTH:  1946-12-08  DATE OF PROCEDURE:  01/11/2015  PREOPERATIVE DIAGNOSIS: Comminuted right distal radius fracture, intra-articular, displaced.   POSTOPERATIVE DIAGNOSIS: Comminuted right distal radius fracture, intra-articular, displaced.    ADDITIONAL DIAGNOSIS: Carpal tunnel syndrome.   PROCEDURES: Open reduction and internal fixation, right distal radius; right carpal tunnel release.   ANESTHESIA: General.   SURGEON: Hessie Knows, MD   DESCRIPTION OF PROCEDURE: The patient was brought to the operating room and after adequate anesthesia was obtained, the right arm was prepped and draped in usual sterile fashion with a tourniquet applied to the upper arm. After patient identification and timeout procedures were completed and having prepped and draped, the tourniquet raised to 250 mmHg. An incision was made over the FCR tendon. The tendon sheath was incised and the tendon retracted radially. Deep fascia was then incised and the pronator was identified and elevated off the distal fragment and shaft. With longitudinal traction placed through fingertrap traction off the end of the bed with approximately 9.5 pounds being utilized, the fracture could be reduced with the use of Soil scientist. There was a posterior spike which could be manipulated through the fracture with the Valora Corporal and gotten into much better alignment. Essentially anatomic alignment could be obtained. A distal first approach was made with a standard DVR plate, pinning it to the distal fragment. Multiple smooth pegs were placed and the K wire removed. The proximal screws were then filled sequentially with 12 mm screws, drilling, measuring, and placing the screws. The reduction appeared essentially anatomic and was stable after releasing traction and placing it through range of motion. The wound was irrigated and then the carpal tunnel was addressed through approximately 3 cm  incision in line with the ring metacarpal. This was then carried down through the skin and subcutaneous tissue. The transverse carpal ligament identified and incised. A vascular hemostat placed to protect the underlying structures. Release is carried out proximally and distally. There appeared to be significant edema of the flexor tendons. There appeared to be adequate after this decompression. The carpal tunnel incision was injected with 10 mL of 0.5% Sensorcaine to aid in postoperative analgesia. The wound was closed with simple interrupted 4-0 nylon. The tourniquet was let down at this point and there is no significant bleeding from the volar approach to the wrist. The wound was again irrigated and closed with 3-0 Vicryl subcutaneously followed by 4-0 nylon skin sutures. Xeroform, 4 x 4's, Webril, and a volar splint were applied followed by an Ace wrap.   ESTIMATED BLOOD LOSS: Minimal.   COMPLICATIONS: None.   TOURNIQUET TIME: 42 minutes at 250 mmHg.   IMPLANTS: [employer] standard DVR plate with 3 proximal screw holes, multiple pegs.   CONDITION: To recovery room stable.   ____________________________ Laurene Footman, MD mjm:bm D: 01/11/2015 19:10:11 ET T: 01/12/2015 01:54:26 ET JOB#: [id number]  cc: Laurene Footman, MD, <Dictator> Laurene Footman MD ELECTRONICALLY SIGNED 01/12/2015 10:01

## 2016-01-22 IMAGING — US ABDOMEN ULTRASOUND
1 series · 14 of 25 positions shown · non-contrast
Comparison: CT abdomen and pelvis 01/26/2012.

CLINICAL DATA: Abdominal distention.

EXAM:
ULTRASOUND ABDOMEN COMPLETE

[Series 1: abdomen ultrasound · 0.35mm/px · 14 of 73 slices shown]
[im 1/73]
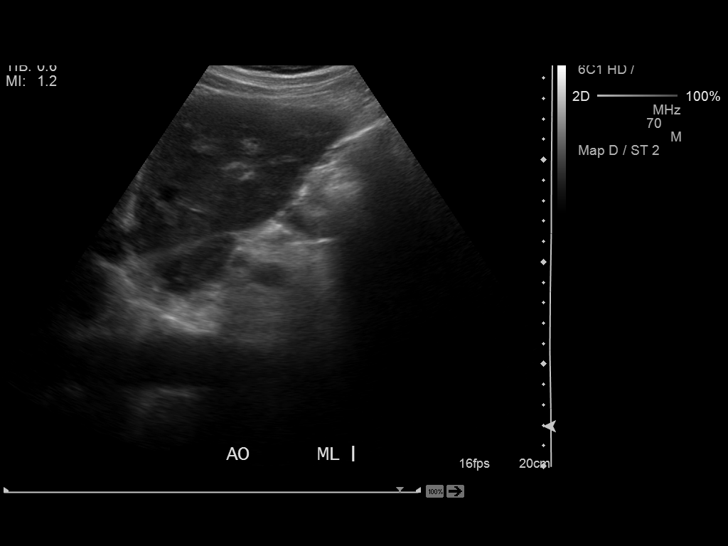
[im 7/73]
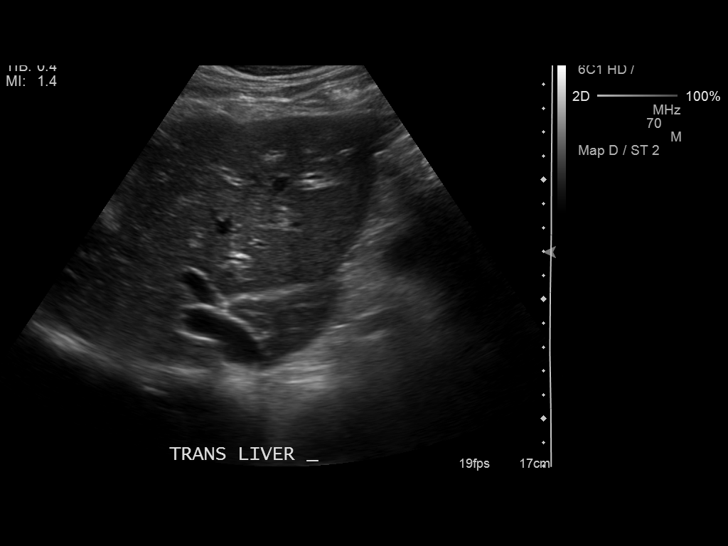
[im 13/73]
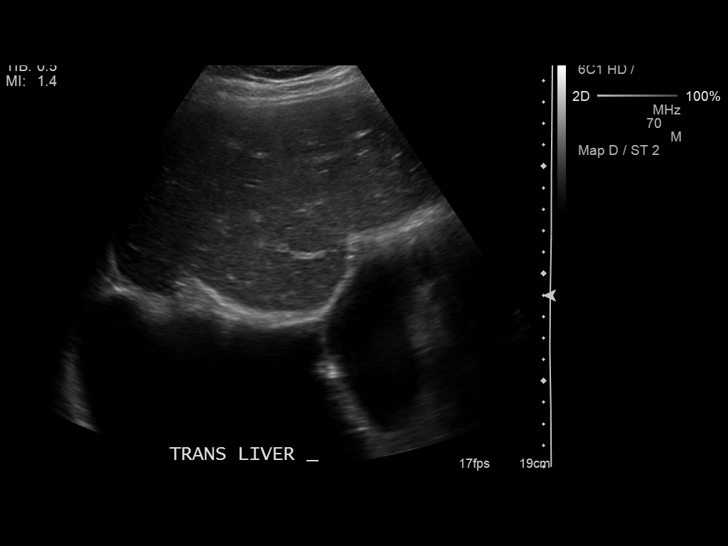
[im 19/73]
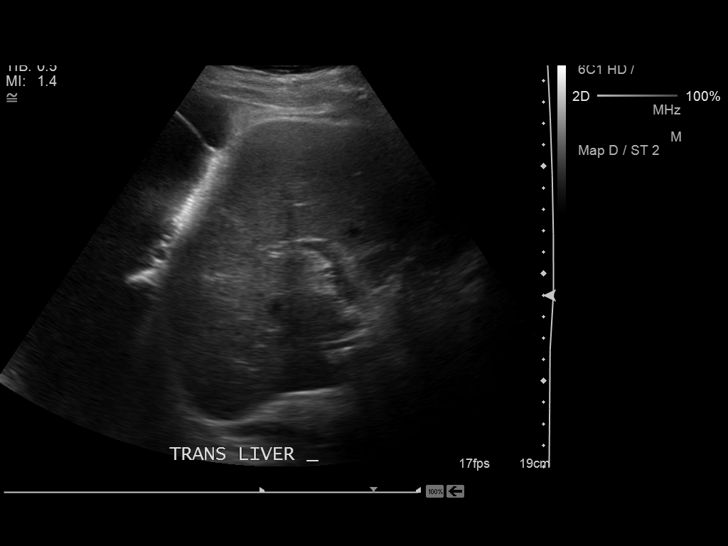
[im 25/73]
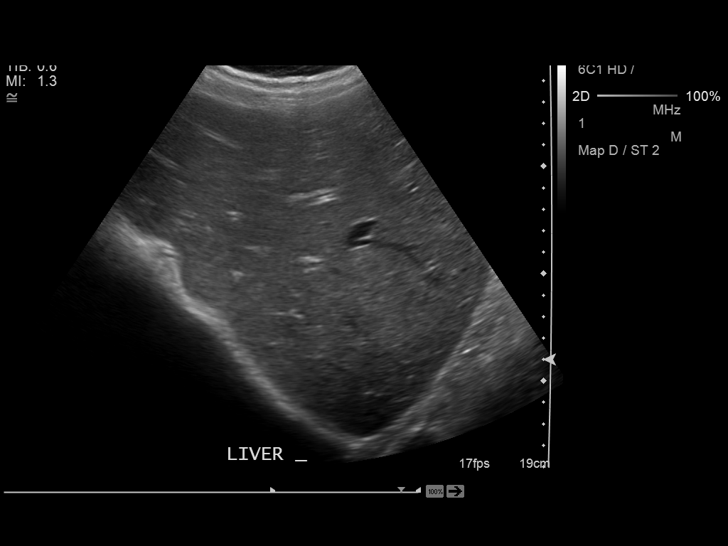
[im 28/73]
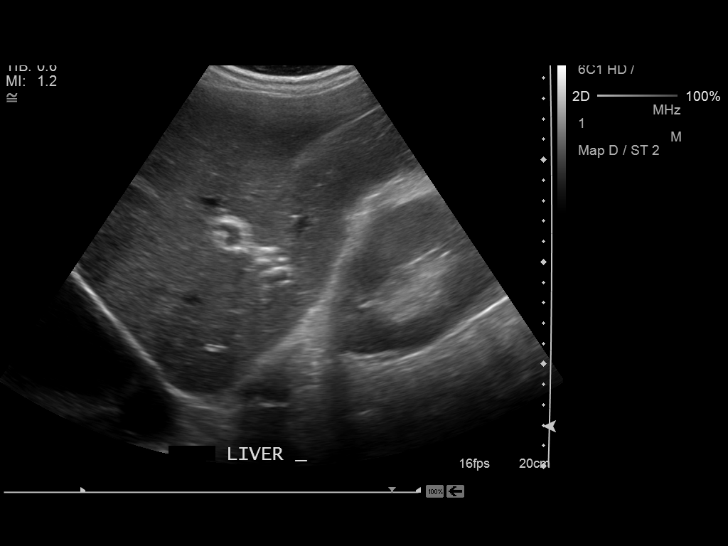
[im 34/73]
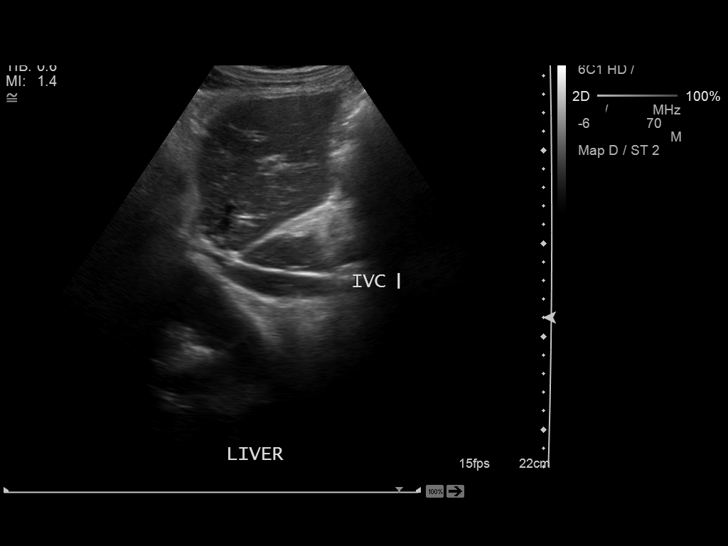
[im 40/73]
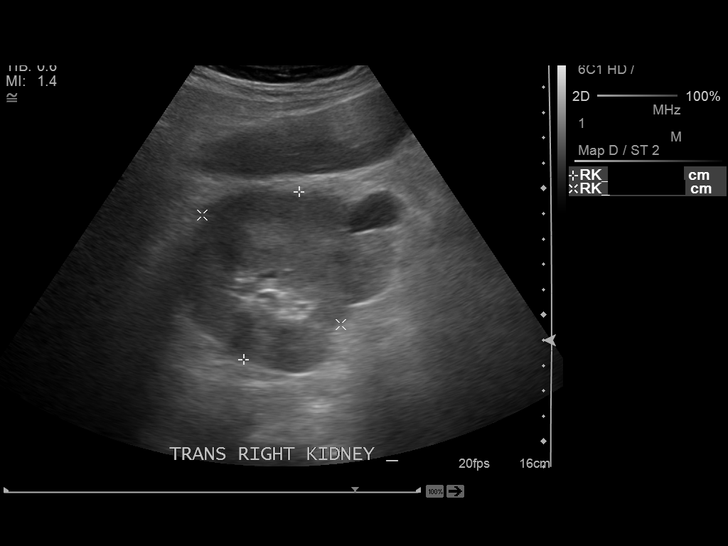
[im 46/73]
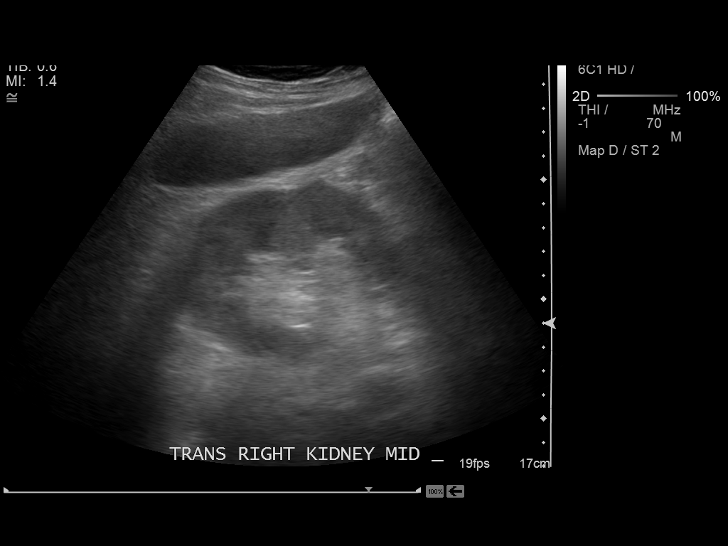
[im 49/73]
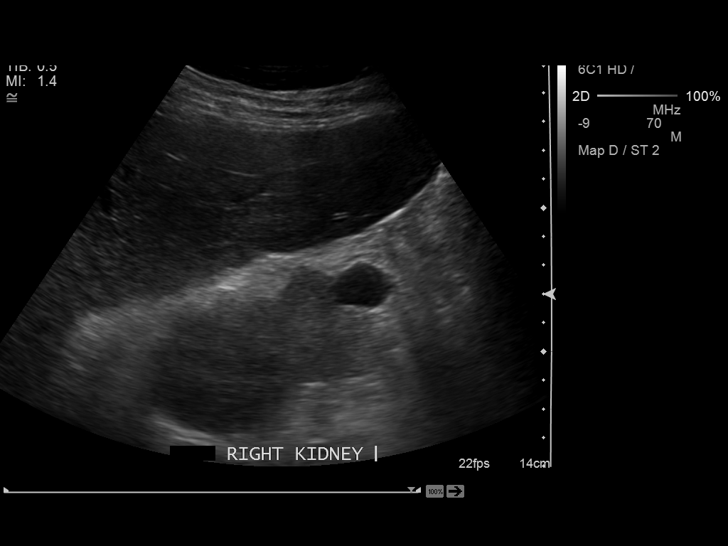
[im 55/73]
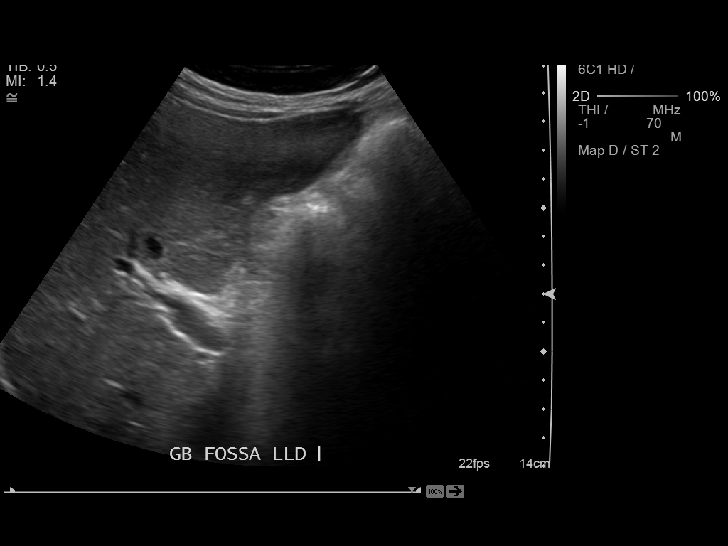
[im 61/73]
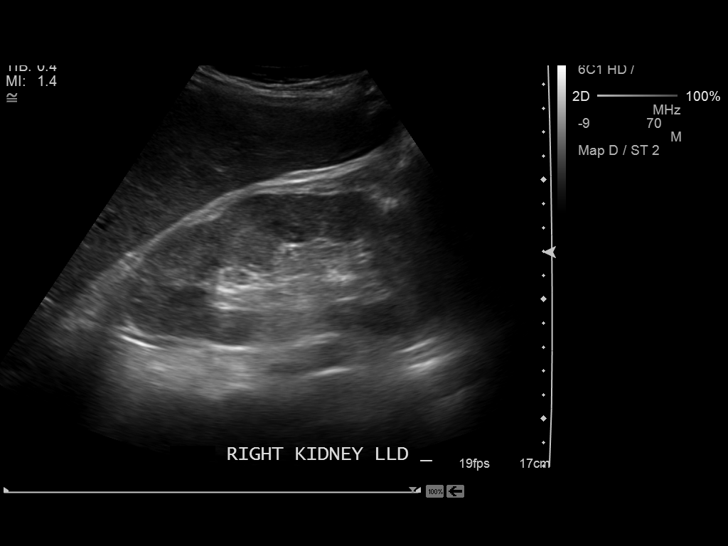
[im 67/73]
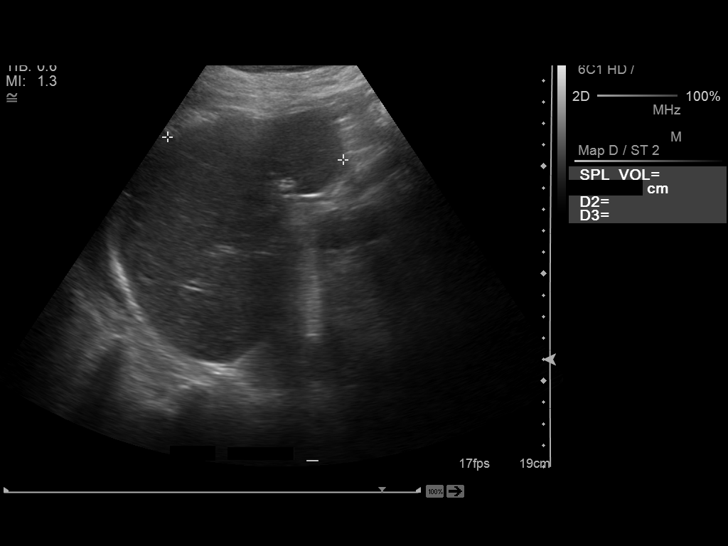
[im 73/73]
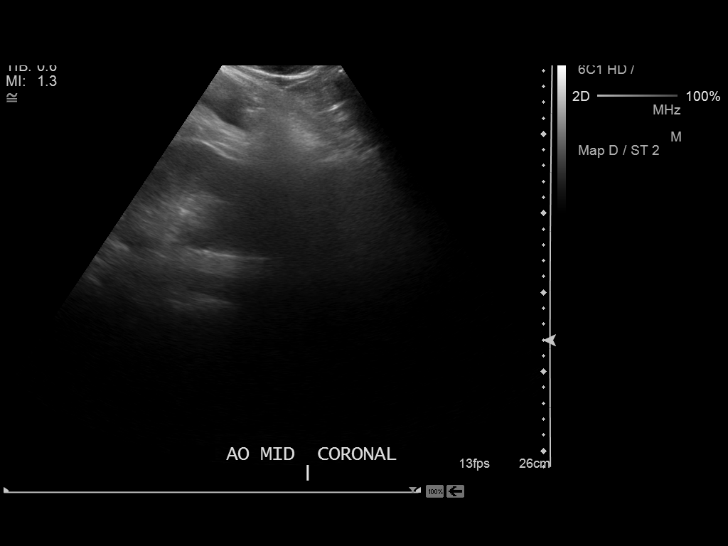

[14 of 25 positions shown; findings below may reference images not displayed]

FINDINGS: Gallbladder:

Removed.

Common bile duct:

Diameter: 0.6 cm.

Liver:

The liver appears enlarged measuring 20.5 cm but no focal lesion or
intrahepatic biliary ductal dilatation is identified. Right pleural
effusion is identified.

IVC:

No abnormality visualized.

Pancreas:

Visualized portion unremarkable.

Spleen:

Size and appearance within normal limits.

Right Kidney:

Length: 14.0 cm. No stone or hydronephrosis. A 1.9 cm simple cyst is
identified

Left Kidney:

Removed.

Abdominal aorta:

No aneurysm visualized.

Other findings:

None.
IMPRESSION: No acute finding in the abdomen.

Right pleural effusion.

Hepatomegaly without focal lesion or biliary ductal dilatation.

Status post left nephrectomy and cholecystectomy.

## 2016-01-27 IMAGING — CR DG CHEST 1V PORT
1 series · 2 of 2 positions shown · non-contrast
Comparison: DG CHEST 1V PORT dated 01/30/2014

CLINICAL DATA: Postop evaluation

EXAM:
PORTABLE CHEST - 1 VIEW

[Series 1: ap · 0.17mm/px · 2 of 2 slices shown]
[im 1/2]
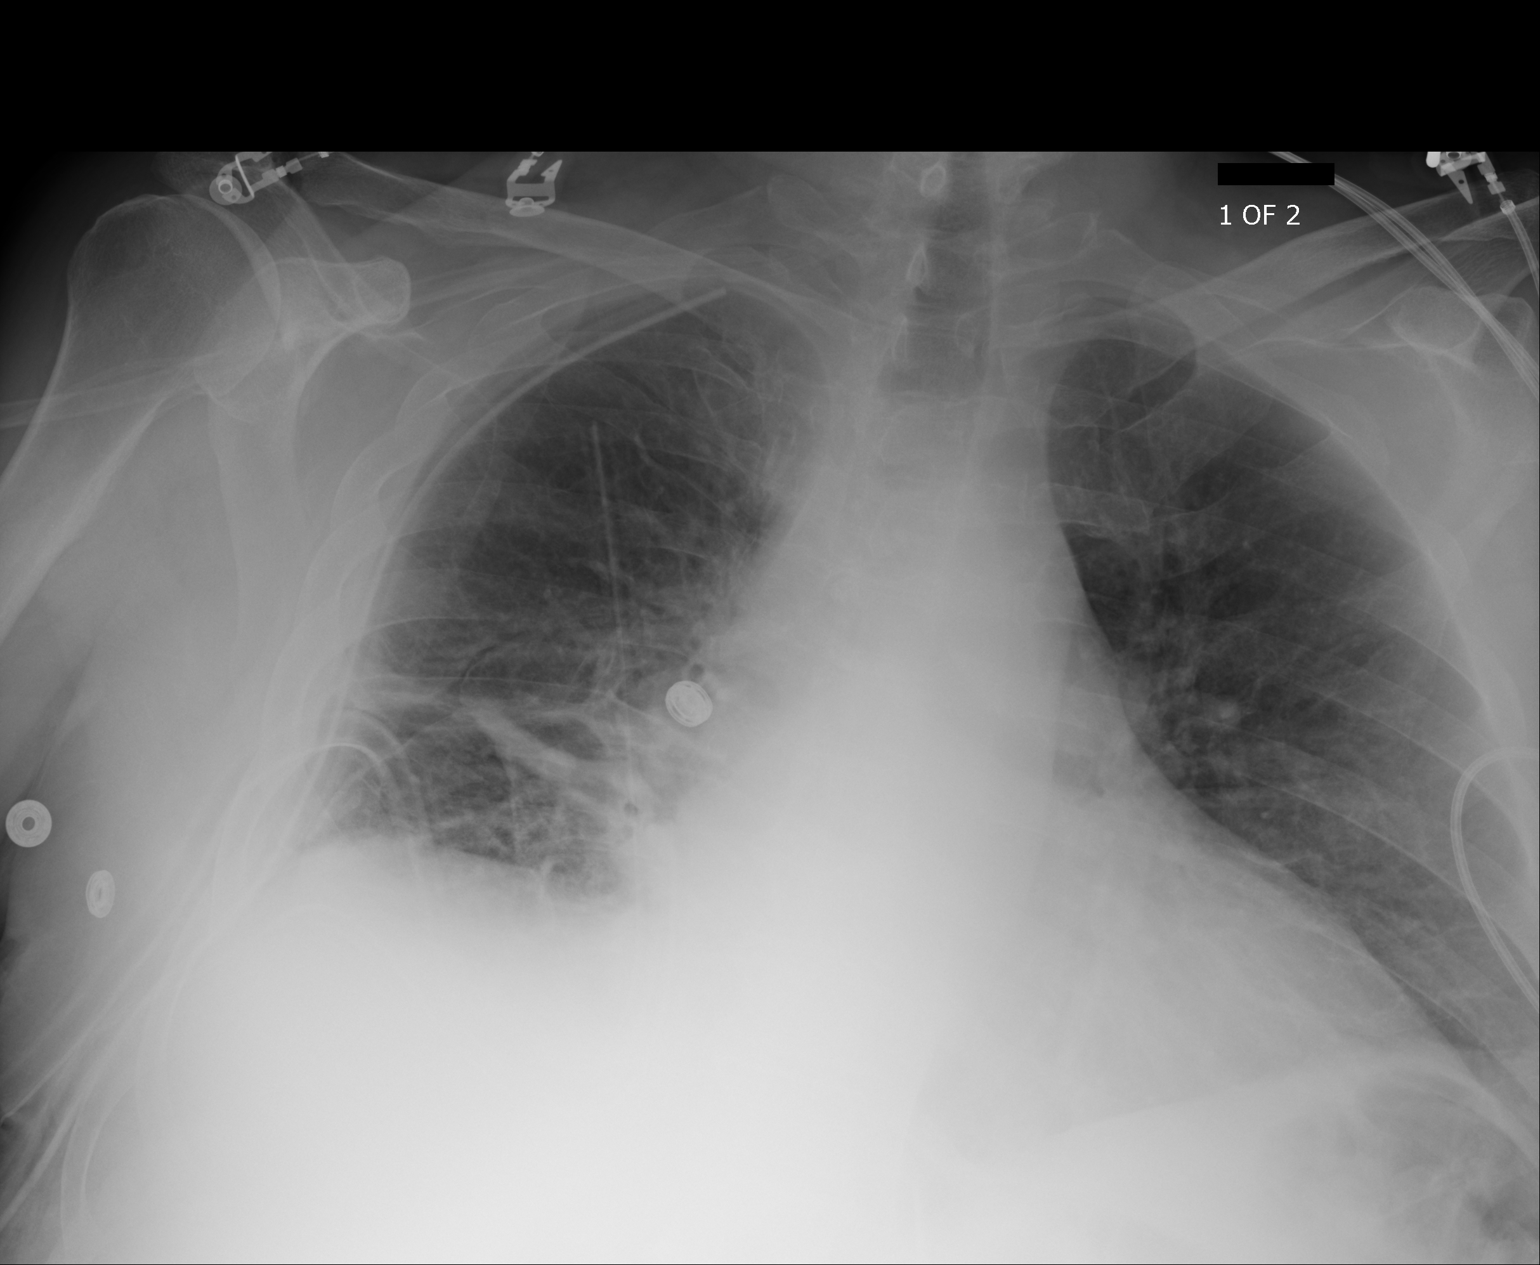
[im 2/2]
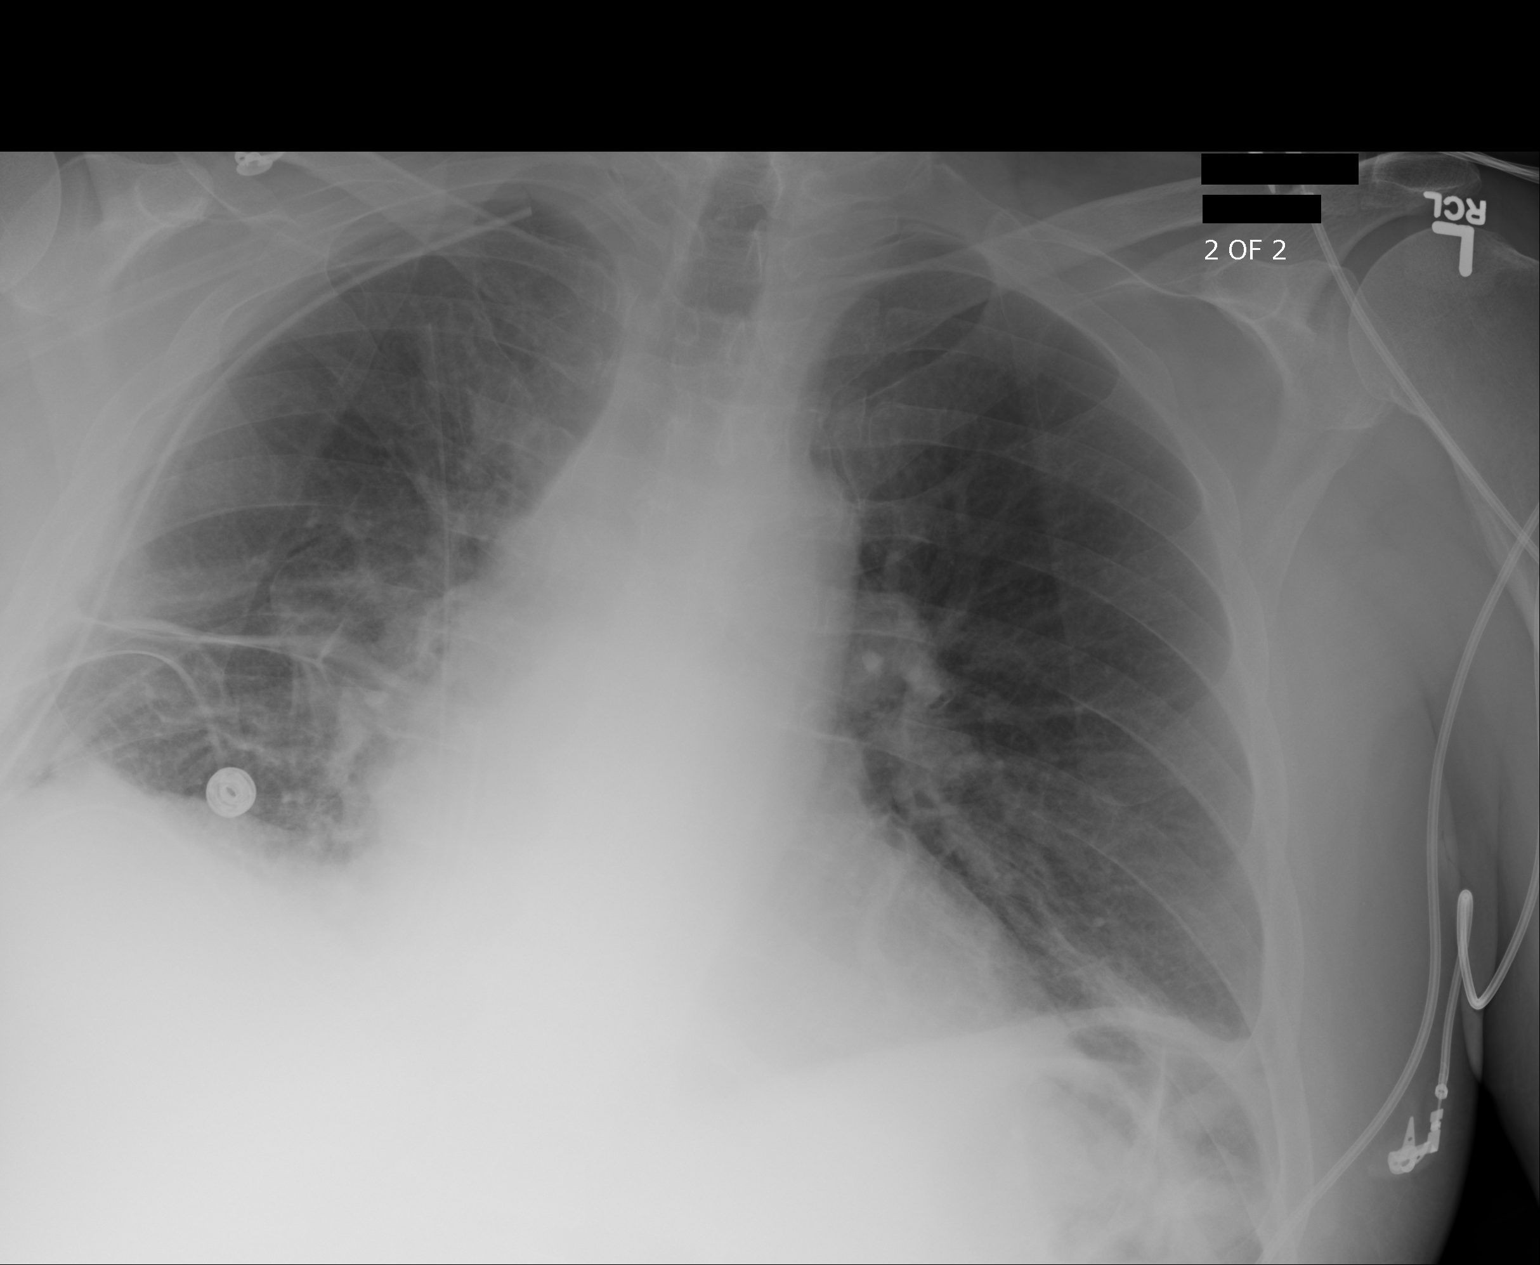

[2 of 2 positions shown; findings below may reference images not displayed]

FINDINGS: Low lung volumes. Chest tubes appreciated projecting in the right
lung apex, and right lung base. There is no evidence of an
appreciable pneumothorax. There is trace blunting of the right
costophrenic angle. The cardiac silhouette is enlarged. Minimal
increased density projects within the right lung base with linear
configuration. The osseous structures are unremarkable.
IMPRESSION: Likely atelectasis and postsurgical changes within the right lung
base. Minimal blunting of the right costophrenic angle may reflect
small effusion. No evidence of appreciable pneumothorax.

## 2016-01-31 IMAGING — CR DG CHEST 1V
1 series · 2 of 2 positions shown · non-contrast
Comparison: DG CHEST 2V dated 02/02/2014;

CLINICAL DATA: Follow-up right pneumothorax with chest tubes in
place.

EXAM:
PORTABLE CHEST - 1 VIEW

[Series 1: ap · 0.17mm/px · 2 of 2 slices shown]
[im 1/2]
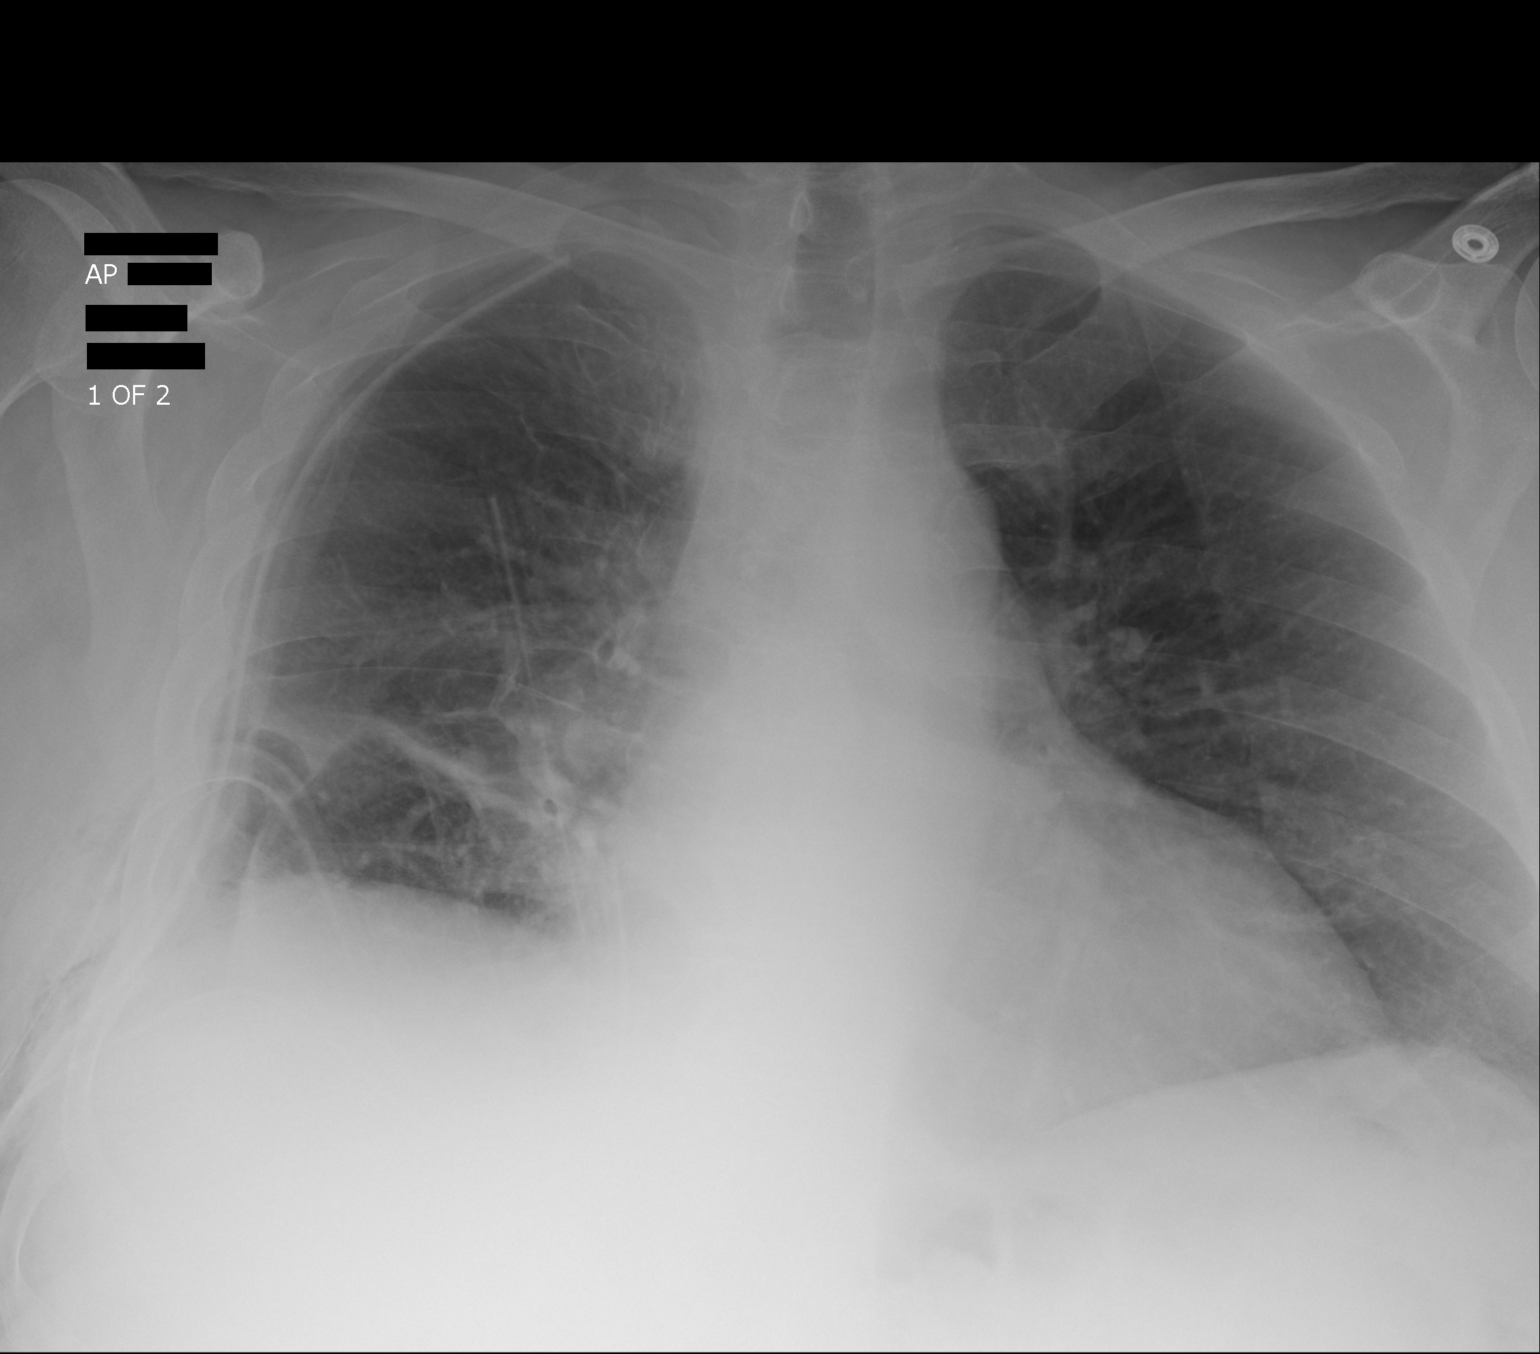
[im 2/2]
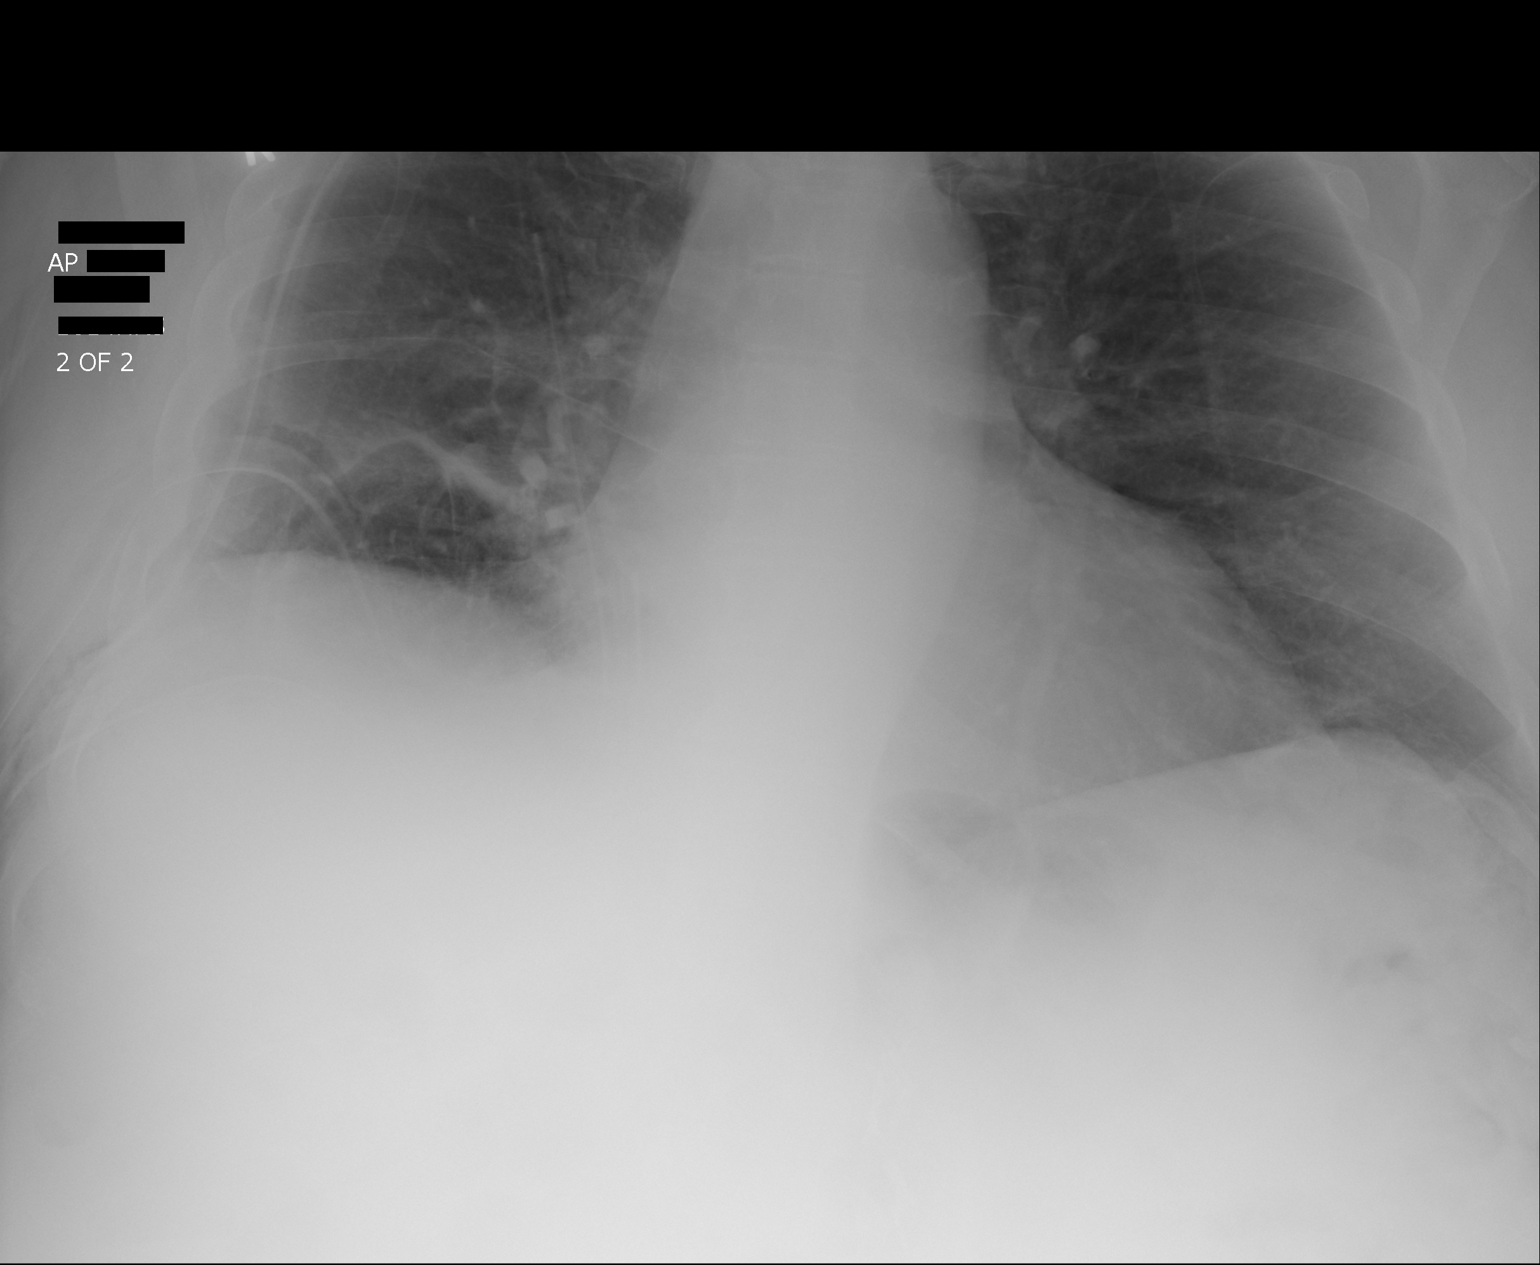

[2 of 2 positions shown; findings below may reference images not displayed]

DG CHEST 1V PORT dated
01/31/2014; DG CHEST 1V PORT dated 01/30/2014; CT GUIDED TUBE
PLACEMENT dated 01/27/2014; DG CHEST POST THORACENTESIS dated
01/26/2014
FINDINGS: Three right chest tubes in place with no pneumothorax. Minimal
residual right pleural effusion. Atelectasis at the right lung base,
unchanged. No new pulmonary parenchymal abnormality another lung.
Cardiac silhouette mildly enlarged but stable. Pulmonary vascularity
normal. Small amount of subcutaneous emphysema in the right chest
wall.
IMPRESSION: 1. Three right chest tubes in place with no pneumothorax and very
little residual right pleural effusion.
2. Stable atelectasis at the right lung base.  No new abnormalities.

## 2016-07-14 ENCOUNTER — Other Ambulatory Visit: Payer: Self-pay | Admitting: Family Medicine

## 2016-07-14 DIAGNOSIS — M7989 Other specified soft tissue disorders: Secondary | ICD-10-CM

## 2016-07-15 ENCOUNTER — Ambulatory Visit
Admission: RE | Admit: 2016-07-15 | Discharge: 2016-07-15 | Disposition: A | Discharge status: Home / Self Care | Payer: Medicare Other | Source: Ambulatory Visit | Attending: Family Medicine | Admitting: Family Medicine

## 2016-07-15 DIAGNOSIS — M7989 Other specified soft tissue disorders: Secondary | ICD-10-CM

## 2017-01-20 ENCOUNTER — Other Ambulatory Visit: Payer: Self-pay | Admitting: Family Medicine

## 2017-01-20 ENCOUNTER — Ambulatory Visit
Admission: RE | Admit: 2017-01-20 | Discharge: 2017-01-20 | Disposition: A | Discharge status: Home / Self Care | Payer: Medicare Other | Source: Ambulatory Visit | Attending: Family Medicine | Admitting: Family Medicine

## 2017-01-20 DIAGNOSIS — I82432 Acute embolism and thrombosis of left popliteal vein: Secondary | ICD-10-CM | POA: Diagnosis not present

## 2017-01-20 DIAGNOSIS — I82412 Acute embolism and thrombosis of left femoral vein: Secondary | ICD-10-CM | POA: Insufficient documentation

## 2017-01-20 DIAGNOSIS — M7989 Other specified soft tissue disorders: Secondary | ICD-10-CM | POA: Diagnosis not present

## 2017-01-23 DIAGNOSIS — Z86718 Personal history of other venous thrombosis and embolism: Secondary | ICD-10-CM | POA: Insufficient documentation

## 2017-04-29 ENCOUNTER — Other Ambulatory Visit
Admission: RE | Admit: 2017-04-29 | Discharge: 2017-04-29 | Disposition: A | Discharge status: Home / Self Care | Payer: Medicare Other | Source: Ambulatory Visit | Attending: Student | Admitting: Student

## 2017-04-29 DIAGNOSIS — K529 Noninfective gastroenteritis and colitis, unspecified: Secondary | ICD-10-CM | POA: Diagnosis present

## 2017-04-29 LAB — GASTROINTESTINAL PANEL BY PCR, STOOL (REPLACES STOOL CULTURE)

## 2017-04-29 LAB — C DIFFICILE QUICK SCREEN W PCR REFLEX
C Diff antigen: POSITIVE — AB
C Diff toxin: NEGATIVE

## 2017-04-29 LAB — CLOSTRIDIUM DIFFICILE BY PCR: Toxigenic C. Difficile by PCR: NEGATIVE

## 2017-05-04 LAB — PANCREATIC ELASTASE, FECAL: Pancreatic Elastase-1, Stool: 192 ug Elast./g — ABNORMAL LOW (ref >200)

## 2017-05-05 ENCOUNTER — Ambulatory Visit: Payer: Medicare Other | Attending: Specialist

## 2017-05-05 DIAGNOSIS — G4733 Obstructive sleep apnea (adult) (pediatric): Secondary | ICD-10-CM | POA: Diagnosis not present

## 2017-05-05 DIAGNOSIS — G4761 Periodic limb movement disorder: Secondary | ICD-10-CM | POA: Insufficient documentation

## 2017-07-28 ENCOUNTER — Encounter: Payer: Self-pay | Admitting: *Deleted

## 2017-07-29 ENCOUNTER — Ambulatory Visit: Payer: Medicare Other | Admitting: Anesthesiology

## 2017-07-29 ENCOUNTER — Encounter
Admission: RE | Disposition: A | Discharge status: Home / Self Care | Payer: Self-pay | Source: Ambulatory Visit | Attending: Unknown Physician Specialty

## 2017-07-29 ENCOUNTER — Ambulatory Visit
Admission: RE | Admit: 2017-07-29 | Discharge: 2017-07-29 | Disposition: A | Discharge status: Home / Self Care | Payer: Medicare Other | Source: Ambulatory Visit | Attending: Unknown Physician Specialty | Admitting: Unknown Physician Specialty

## 2017-07-29 DIAGNOSIS — K3189 Other diseases of stomach and duodenum: Secondary | ICD-10-CM | POA: Diagnosis not present

## 2017-07-29 DIAGNOSIS — Z9884 Bariatric surgery status: Secondary | ICD-10-CM | POA: Insufficient documentation

## 2017-07-29 DIAGNOSIS — Z8719 Personal history of other diseases of the digestive system: Secondary | ICD-10-CM | POA: Diagnosis not present

## 2017-07-29 DIAGNOSIS — G8929 Other chronic pain: Secondary | ICD-10-CM | POA: Insufficient documentation

## 2017-07-29 DIAGNOSIS — Z87891 Personal history of nicotine dependence: Secondary | ICD-10-CM | POA: Diagnosis not present

## 2017-07-29 DIAGNOSIS — F329 Major depressive disorder, single episode, unspecified: Secondary | ICD-10-CM | POA: Diagnosis not present

## 2017-07-29 DIAGNOSIS — Z7984 Long term (current) use of oral hypoglycemic drugs: Secondary | ICD-10-CM | POA: Diagnosis not present

## 2017-07-29 DIAGNOSIS — G4730 Sleep apnea, unspecified: Secondary | ICD-10-CM | POA: Insufficient documentation

## 2017-07-29 DIAGNOSIS — Z79899 Other long term (current) drug therapy: Secondary | ICD-10-CM | POA: Insufficient documentation

## 2017-07-29 DIAGNOSIS — I10 Essential (primary) hypertension: Secondary | ICD-10-CM | POA: Diagnosis not present

## 2017-07-29 DIAGNOSIS — E785 Hyperlipidemia, unspecified: Secondary | ICD-10-CM | POA: Diagnosis not present

## 2017-07-29 DIAGNOSIS — E119 Type 2 diabetes mellitus without complications: Secondary | ICD-10-CM | POA: Insufficient documentation

## 2017-07-29 DIAGNOSIS — J449 Chronic obstructive pulmonary disease, unspecified: Secondary | ICD-10-CM | POA: Insufficient documentation

## 2017-07-29 DIAGNOSIS — R12 Heartburn: Secondary | ICD-10-CM | POA: Diagnosis present

## 2017-07-29 DIAGNOSIS — Z85528 Personal history of other malignant neoplasm of kidney: Secondary | ICD-10-CM | POA: Insufficient documentation

## 2017-07-29 DIAGNOSIS — K648 Other hemorrhoids: Secondary | ICD-10-CM | POA: Diagnosis not present

## 2017-07-29 HISTORY — PX: ESOPHAGOGASTRODUODENOSCOPY (EGD) WITH PROPOFOL: SHX5813

## 2017-07-29 HISTORY — DX: Type 2 diabetes mellitus without complications: E11.9

## 2017-07-29 HISTORY — DX: Dorsalgia, unspecified: M54.9

## 2017-07-29 HISTORY — DX: Depression, unspecified: F32.A

## 2017-07-29 HISTORY — DX: Essential (primary) hypertension: I10

## 2017-07-29 HISTORY — DX: Malignant neoplasm of unspecified kidney, except renal pelvis: C64.9

## 2017-07-29 HISTORY — DX: Major depressive disorder, single episode, unspecified: F32.9

## 2017-07-29 HISTORY — DX: Chronic obstructive pulmonary disease, unspecified: J44.9

## 2017-07-29 HISTORY — DX: Other chronic pain: G89.29

## 2017-07-29 HISTORY — DX: Sleep apnea, unspecified: G47.30

## 2017-07-29 HISTORY — DX: Hyperlipidemia, unspecified: E78.5

## 2017-07-29 HISTORY — PX: COLONOSCOPY WITH PROPOFOL: SHX5780

## 2017-07-29 LAB — GLUCOSE, CAPILLARY: Glucose-Capillary: 136 mg/dL — ABNORMAL HIGH (ref 65–99)

## 2017-07-29 SURGERY — ESOPHAGOGASTRODUODENOSCOPY (EGD) WITH PROPOFOL
Anesthesia: General

## 2017-07-29 MED ORDER — PROPOFOL 500 MG/50ML IV EMUL
INTRAVENOUS | Status: AC
  Filled 2017-07-29: qty 50

## 2017-07-29 MED ORDER — GLYCOPYRROLATE 0.2 MG/ML IJ SOLN
INTRAMUSCULAR | Status: AC
  Filled 2017-07-29: qty 1

## 2017-07-29 MED ORDER — FENTANYL CITRATE (PF) 100 MCG/2ML IJ SOLN
INTRAMUSCULAR | Status: DC | PRN
  Administered 2017-07-29 (×2): 50 ug via INTRAVENOUS

## 2017-07-29 MED ORDER — EPHEDRINE SULFATE 50 MG/ML IJ SOLN
INTRAMUSCULAR | Status: AC
  Filled 2017-07-29: qty 1

## 2017-07-29 MED ORDER — FENTANYL CITRATE (PF) 100 MCG/2ML IJ SOLN
INTRAMUSCULAR | Status: AC
  Filled 2017-07-29: qty 2

## 2017-07-29 MED ORDER — LIDOCAINE HCL (PF) 2 % IJ SOLN
INTRAMUSCULAR | Status: AC
  Filled 2017-07-29: qty 2

## 2017-07-29 MED ORDER — SODIUM CHLORIDE 0.9 % IV SOLN
INTRAVENOUS | Status: DC
  Administered 2017-07-29: 09:00:00 via INTRAVENOUS

## 2017-07-29 MED ORDER — LIDOCAINE HCL (PF) 2 % IJ SOLN
INTRAMUSCULAR | Status: DC | PRN
  Administered 2017-07-29: 50 mg

## 2017-07-29 MED ORDER — PROPOFOL 10 MG/ML IV BOLUS
INTRAVENOUS | Status: DC | PRN
  Administered 2017-07-29: 75 mg via INTRAVENOUS

## 2017-07-29 MED ORDER — PROPOFOL 500 MG/50ML IV EMUL
INTRAVENOUS | Status: DC | PRN
  Administered 2017-07-29: 75 ug/kg/min via INTRAVENOUS

## 2017-07-29 MED ORDER — MIDAZOLAM HCL 2 MG/2ML IJ SOLN
INTRAMUSCULAR | Status: AC
  Filled 2017-07-29: qty 2

## 2017-07-29 MED ORDER — MIDAZOLAM HCL 5 MG/5ML IJ SOLN
INTRAMUSCULAR | Status: DC | PRN
  Administered 2017-07-29: 2 mg via INTRAVENOUS

## 2017-07-29 MED ORDER — EPHEDRINE SULFATE 50 MG/ML IJ SOLN
INTRAMUSCULAR | Status: DC | PRN
  Administered 2017-07-29: 5 mg via INTRAVENOUS
  Administered 2017-07-29: 10 mg via INTRAVENOUS

## 2017-07-29 NOTE — H&P (Signed)
Primary Care Physician:  Derinda Late, MD Primary Gastroenterologist:  Dr. Vira Agar  Pre-Procedure History & Physical: HPI:  [name] is a 71 y.o. male is here for an endoscopy and colonoscopy.   Past Medical History:  Diagnosis Date  . Chronic back pain   . COPD (chronic obstructive pulmonary disease) ([place])   . Depression   . Diabetes mellitus without complication ([place])   . Hyperlipidemia   . Hypertension   . Renal cell carcinoma ([place])   . Sleep apnea     Past Surgical History:  Procedure Laterality Date  . CARPAL TUNNEL RELEASE    . colonoscopys    . FLEXIBLE SIGMOIDOSCOPY    . GASTRIC BYPASS    . NEPHRECTOMY    . open reduction and internal fixation, right distal radius    . right knee arthroscopy    . UPPER GI ENDOSCOPY      Prior to Admission medications   Medication Sig Start Date End Date Taking? Authorizing Provider  amLODipine (NORVASC) 10 MG tablet Take 10 mg by mouth daily.   Yes [provider]  Apixaban (ELIQUIS PO) Take by mouth.   Yes [provider]  carvedilol (COREG) 6.25 MG tablet Take 6.25 mg by mouth 2 (two) times daily with a meal.   Yes [provider]  enalapril (VASOTEC) 20 MG tablet Take 20 mg by mouth daily.   Yes [provider]  furosemide (LASIX) 40 MG tablet Take 40 mg by mouth.   Yes [provider]  glipiZIDE (GLUCOTROL) 5 MG tablet Take by mouth daily before breakfast.   Yes [provider]  hydrochlorothiazide (HYDRODIURIL) 25 MG tablet Take 25 mg by mouth daily.   Yes [provider]  Multiple Vitamin (MULTIVITAMIN) tablet Take 1 tablet by mouth daily.   Yes [provider]  pantoprazole (PROTONIX) 40 MG tablet Take 40 mg by mouth daily.   Yes [provider]  PARoxetine (PAXIL) 40 MG tablet Take 40 mg by mouth every morning.   Yes [provider]  pramipexole (MIRAPEX) 0.5 MG tablet Take 0.5 mg by mouth 3 (three) times daily.   Yes  [provider]  sodium bicarbonate 650 MG tablet Take 650 mg by mouth 4 (four) times daily.   Yes [provider]  sucralfate (CARAFATE) 1 g tablet Take 1 g by mouth 4 (four) times daily -  with meals and at bedtime.   Yes [provider]    Allergies as of 04/30/2017  . (Not on File)    No family history on file.  Social History   Social History  . Marital status: Married    Spouse name: N/A  . Number of children: N/A  . Years of education: N/A   Occupational History  . Not on file.   Social History Main Topics  . Smoking status: Former Research scientist (life sciences)  . Smokeless tobacco: Not on file  . Alcohol use Not on file  . Drug use: Unknown  . Sexual activity: Not on file   Other Topics Concern  . Not on file   Social History Narrative  . No narrative on file    Review of Systems: See HPI, otherwise negative ROS  Physical Exam: BP 130/68   Pulse 71   Temp 97.7 F (36.5 C) (Tympanic)   Resp 16   Ht 6' (1.829 m)   Wt 113.4 kg (250 lb)   SpO2 97%   BMI 33.91 kg/m  General:  Alert,  pleasant and cooperative in NAD Head:  Normocephalic and atraumatic. Neck:  Supple; no masses or thyromegaly. Lungs:  Clear throughout to auscultation.    Heart:  Regular rate and rhythm. Abdomen:  Soft, nontender and nondistended. Normal bowel sounds, without guarding, and without rebound.  Somewhat obese Neurologic:  Alert and  oriented x4;  grossly normal neurologically.  Impression/Plan: [name] is here for an endoscopy and colonoscopy to be performed for [facility] colon polyps and GERD and heartburn  Risks, benefits, limitations, and alternatives regarding  endoscopy and colonoscopy have been reviewed with the patient.  Questions have been answered.  All parties agreeable.   Gaylyn Cheers, MD  07/29/2017, 9:08 AM

## 2017-07-29 NOTE — Transfer of Care (Signed)
Immediate Anesthesia Transfer of Care Note  Patient: [name]  Procedure(s) Performed: Procedure(s): ESOPHAGOGASTRODUODENOSCOPY (EGD) WITH PROPOFOL (N/A) COLONOSCOPY WITH PROPOFOL (N/A)  Patient Location: PACU  Anesthesia Type:General  Level of Consciousness: sedated  Airway & Oxygen Therapy: Patient Spontanous Breathing and Patient connected to nasal cannula oxygen  Post-op Assessment: Report given to RN and Post -op Vital signs reviewed and stable  Post vital signs: Reviewed and stable  Last Vitals:  Vitals:   07/29/17 0834 07/29/17 0946  BP: 130/68 110/65  Pulse: 71 71  Resp: 16 11  Temp: 36.5 C (!) 36.3 C  SpO2: 97% 97%    Last Pain:  Vitals:   07/29/17 0946  TempSrc: Tympanic         Complications: No apparent anesthesia complications

## 2017-07-29 NOTE — Op Note (Signed)
[facility] Gastroenterology Patient Name: [name] Procedure Date: 07/29/2017 9:15 AM MRN: [id number] Account #: [id number] Date of Birth: [birth year] Admit Type: Outpatient Age: 71 Room: [facility] ENDO ROOM 3 Gender: Male Note Status: Finalized Procedure:            Upper GI endoscopy Indications:          Heartburn Providers:            Manya Silvas, MD Referring MD:         Caprice Renshaw MD (Referring MD) Medicines:            Propofol per Anesthesia Complications:        No immediate complications. Procedure:            Pre-Anesthesia Assessment:                       - After reviewing the risks and benefits, the patient                        was deemed in satisfactory condition to undergo the                        procedure.                       After obtaining informed consent, the endoscope was                        passed under direct vision. Throughout the procedure,                        the patient's blood pressure, pulse, and oxygen                        saturations were monitored continuously. The Endoscope                        was introduced through the mouth, and advanced to the                        afferent and efferent jejunal loops. The upper GI                        endoscopy was accomplished without difficulty. The                        patient tolerated the procedure well. Findings:      The esophagus was normal. GEJ 40cm      Diffuse mildly congested mucosa was found in the gastric body. Biopsies       were taken with a cold forceps for histology. Biopsies were taken with a       cold forceps for Helicobacter pylori testing. Anastamosis looked good. Impression:           - Normal esophagus.                       - Congestive gastropathy. Biopsied. Recommendation:       - Await pathology results. Restart Eliquis tomorrow                        evening.  Manya Silvas, MD 07/29/2017 9:27:50 AM This report has been  signed electronically. Number of Addenda: 0 Note Initiated On: 07/29/2017 9:15 AM      [facility]

## 2017-07-29 NOTE — Anesthesia Postprocedure Evaluation (Signed)
Anesthesia Post Note  Patient: [name]  Procedure(s) Performed: Procedure(s) (LRB): ESOPHAGOGASTRODUODENOSCOPY (EGD) WITH PROPOFOL (N/A) COLONOSCOPY WITH PROPOFOL (N/A)  Patient location during evaluation: Endoscopy Anesthesia Type: General Level of consciousness: awake and alert Pain management: pain level controlled Vital Signs Assessment: post-procedure vital signs reviewed and stable Respiratory status: spontaneous breathing and respiratory function stable Cardiovascular status: stable Anesthetic complications: no     Last Vitals:  Vitals:   07/29/17 1006 07/29/17 1016  BP: 126/73 125/75  Pulse: 64 64  Resp: 14 16  Temp:    SpO2: 100% 100%    Last Pain:  Vitals:   07/29/17 0946  TempSrc: Tympanic                 KEPHART,WILLIAM K

## 2017-07-29 NOTE — Anesthesia Preprocedure Evaluation (Signed)
Anesthesia Evaluation  Patient identified by MRN, date of birth, ID band Patient awake    Reviewed: Allergy & Precautions, NPO status , Patient's Chart, lab work & pertinent test results  History of Anesthesia Complications Negative for: history of anesthetic complications  Airway Mallampati: II       Dental   Pulmonary sleep apnea ("not enough to treat") , COPD (pt denies), former smoker,           Cardiovascular hypertension, Pt. on medications and Pt. on home beta blockers (-) Past MI and (-) CHF (-) dysrhythmias (-) Valvular Problems/Murmurs     Neuro/Psych neg Seizures Depression    GI/Hepatic Neg liver ROS, PUD,   Endo/Other  diabetes, Type 2, Oral Hypoglycemic Agents  Renal/GU Renal disease (Renal cell CA, s/p nephrectomy)     Musculoskeletal   Abdominal   Peds  Hematology   Anesthesia Other Findings   Reproductive/Obstetrics                             Anesthesia Physical Anesthesia Plan  ASA: III  Anesthesia Plan: General   Post-op Pain Management:    Induction: Intravenous  PONV Risk Score and Plan:   Airway Management Planned:   Additional Equipment:   Intra-op Plan:   Post-operative Plan:   Informed Consent: I have reviewed the patients History and Physical, chart, labs and discussed the procedure including the risks, benefits and alternatives for the proposed anesthesia with the patient or authorized representative who has indicated his/her understanding and acceptance.     Plan Discussed with:   Anesthesia Plan Comments:         Anesthesia Quick Evaluation

## 2017-07-29 NOTE — Anesthesia Post-op Follow-up Note (Signed)
Anesthesia QCDR form completed.        

## 2017-07-29 NOTE — Op Note (Signed)
[facility] Gastroenterology Patient Name: [name] Procedure Date: 07/29/2017 9:14 AM MRN: [id number] Account #: [id number] Date of Birth: 10/16/1946 Admit Type: Outpatient Age: 71 Room: [facility] ENDO ROOM 3 Gender: Male Note Status: Finalized Procedure:            Colonoscopy Indications:          High risk colon cancer surveillance: Personal history                        of colonic polyps Providers:            Manya Silvas, MD Referring MD:         Caprice Renshaw MD (Referring MD) Medicines:            Propofol per Anesthesia Complications:        No immediate complications. Procedure:            Pre-Anesthesia Assessment:                       - After reviewing the risks and benefits, the patient                        was deemed in satisfactory condition to undergo the                        procedure.                       After obtaining informed consent, the colonoscope was                        passed under direct vision. Throughout the procedure,                        the patient's blood pressure, pulse, and oxygen                        saturations were monitored continuously. The                        Colonoscope was introduced through the anus and                        advanced to the the cecum, identified by appendiceal                        orifice and ileocecal valve. The colonoscopy was                        performed without difficulty. The patient tolerated the                        procedure well. The quality of the bowel preparation                        was adequate to identify polyps. Findings:      Internal hemorrhoids were found during endoscopy. The hemorrhoids were       small and Grade I (internal hemorrhoids that do not prolapse).      The exam was otherwise without abnormality. Impression:           -  Internal hemorrhoids.                       - The examination was otherwise normal.                       - No  specimens collected. Recommendation:       - Repeat colonoscopy in 5 years for screening purposes. Manya Silvas, MD 07/29/2017 9:46:07 AM This report has been signed electronically. Number of Addenda: 0 Note Initiated On: 07/29/2017 9:14 AM Scope Withdrawal Time: 0 hours 7 minutes 56 seconds  Total Procedure Duration: 0 hours 15 minutes 14 seconds       [facility]

## 2017-07-30 ENCOUNTER — Encounter: Payer: Self-pay | Admitting: Unknown Physician Specialty

## 2017-07-30 LAB — SURGICAL PATHOLOGY

## 2018-02-03 ENCOUNTER — Ambulatory Visit
Admission: RE | Admit: 2018-02-03 | Discharge: 2018-02-03 | Disposition: A | Discharge status: Home / Self Care | Payer: Medicare Other | Source: Ambulatory Visit | Attending: Nephrology | Admitting: Nephrology

## 2018-02-03 ENCOUNTER — Other Ambulatory Visit: Payer: Self-pay | Admitting: Nephrology

## 2018-02-03 DIAGNOSIS — R0602 Shortness of breath: Secondary | ICD-10-CM | POA: Insufficient documentation

## 2018-02-04 ENCOUNTER — Ambulatory Visit
Admission: RE | Admit: 2018-02-04 | Discharge: 2018-02-04 | Disposition: A | Discharge status: Home / Self Care | Payer: Medicare Other | Source: Ambulatory Visit | Attending: Nephrology | Admitting: Nephrology

## 2018-02-04 ENCOUNTER — Ambulatory Visit (INDEPENDENT_AMBULATORY_CARE_PROVIDER_SITE_OTHER): Payer: Medicare Other

## 2018-02-04 ENCOUNTER — Other Ambulatory Visit (INDEPENDENT_AMBULATORY_CARE_PROVIDER_SITE_OTHER): Payer: Self-pay | Admitting: Nephrology

## 2018-02-04 DIAGNOSIS — I82532 Chronic embolism and thrombosis of left popliteal vein: Secondary | ICD-10-CM

## 2018-02-04 DIAGNOSIS — R609 Edema, unspecified: Secondary | ICD-10-CM | POA: Insufficient documentation

## 2018-02-04 DIAGNOSIS — I82502 Chronic embolism and thrombosis of unspecified deep veins of left lower extremity: Secondary | ICD-10-CM

## 2018-02-04 DIAGNOSIS — N183 Chronic kidney disease, stage 3 (moderate): Secondary | ICD-10-CM | POA: Insufficient documentation

## 2018-02-04 MED ORDER — FUROSEMIDE 10 MG/ML IJ SOLN
INTRAMUSCULAR | Status: AC
  Administered 2018-02-04: 40 mg via INTRAVENOUS
  Filled 2018-02-04: qty 4

## 2018-02-04 MED ORDER — SODIUM CHLORIDE FLUSH 0.9 % IV SOLN
INTRAVENOUS | Status: AC
  Filled 2018-02-04: qty 30

## 2018-02-04 MED ORDER — FUROSEMIDE 10 MG/ML IJ SOLN
40.0000 mg | Freq: Once | INTRAMUSCULAR | Status: AC
  Administered 2018-02-04: 40 mg via INTRAVENOUS

## 2018-02-04 NOTE — OR Nursing (Signed)
Clarified with Dr Holley Raring that we did not need any lab work.  Dr Holley Raring stated he was following labs in his office.

## 2018-02-05 ENCOUNTER — Ambulatory Visit
Admission: RE | Admit: 2018-02-05 | Discharge: 2018-02-05 | Disposition: A | Discharge status: Home / Self Care | Payer: Medicare Other | Source: Ambulatory Visit | Attending: Nephrology | Admitting: Nephrology

## 2018-02-05 DIAGNOSIS — R609 Edema, unspecified: Secondary | ICD-10-CM | POA: Insufficient documentation

## 2018-02-05 DIAGNOSIS — N183 Chronic kidney disease, stage 3 (moderate): Secondary | ICD-10-CM | POA: Insufficient documentation

## 2018-02-05 MED ORDER — SODIUM CHLORIDE FLUSH 0.9 % IV SOLN
INTRAVENOUS | Status: AC
  Filled 2018-02-05: qty 20

## 2018-02-05 MED ORDER — FUROSEMIDE 10 MG/ML IJ SOLN
INTRAMUSCULAR | Status: AC
  Administered 2018-02-05: 40 mg via INTRAVENOUS
  Filled 2018-02-05: qty 4

## 2018-02-05 MED ORDER — FUROSEMIDE 10 MG/ML IJ SOLN
40.0000 mg | Freq: Once | INTRAMUSCULAR | Status: AC
  Administered 2018-02-05: 40 mg via INTRAVENOUS

## 2018-02-06 ENCOUNTER — Ambulatory Visit
Admission: RE | Admit: 2018-02-06 | Discharge: 2018-02-06 | Disposition: A | Discharge status: Home / Self Care | Payer: Medicare Other | Source: Ambulatory Visit | Attending: Nephrology | Admitting: Nephrology

## 2018-02-06 DIAGNOSIS — R609 Edema, unspecified: Secondary | ICD-10-CM | POA: Diagnosis present

## 2018-02-06 DIAGNOSIS — N183 Chronic kidney disease, stage 3 (moderate): Secondary | ICD-10-CM | POA: Insufficient documentation

## 2018-02-06 MED ORDER — FUROSEMIDE 10 MG/ML IJ SOLN
40.0000 mg | Freq: Once | INTRAMUSCULAR | Status: AC
  Administered 2018-02-06: 40 mg via INTRAVENOUS

## 2018-02-09 ENCOUNTER — Ambulatory Visit
Admission: RE | Admit: 2018-02-09 | Discharge: 2018-02-09 | Disposition: A | Discharge status: Home / Self Care | Payer: Medicare Other | Source: Ambulatory Visit | Attending: Nephrology | Admitting: Nephrology

## 2018-02-09 DIAGNOSIS — E119 Type 2 diabetes mellitus without complications: Secondary | ICD-10-CM | POA: Insufficient documentation

## 2018-02-09 DIAGNOSIS — R0602 Shortness of breath: Secondary | ICD-10-CM | POA: Diagnosis present

## 2018-02-09 DIAGNOSIS — I10 Essential (primary) hypertension: Secondary | ICD-10-CM | POA: Insufficient documentation

## 2018-02-09 DIAGNOSIS — J449 Chronic obstructive pulmonary disease, unspecified: Secondary | ICD-10-CM | POA: Diagnosis not present

## 2018-02-09 NOTE — Progress Notes (Signed)
*  PRELIMINARY RESULTS* Echocardiogram 2D Echocardiogram has been performed.  [name] 02/09/2018, 10:33 AM

## 2018-02-23 ENCOUNTER — Ambulatory Visit (INDEPENDENT_AMBULATORY_CARE_PROVIDER_SITE_OTHER): Payer: Medicare Other | Admitting: Vascular Surgery

## 2018-02-23 ENCOUNTER — Encounter (INDEPENDENT_AMBULATORY_CARE_PROVIDER_SITE_OTHER): Payer: Self-pay | Admitting: Vascular Surgery

## 2018-02-23 VITALS — BP 136/73 | HR 84 | Resp 16 | Ht 72.0 in | Wt 272.6 lb

## 2018-02-23 DIAGNOSIS — R600 Localized edema: Secondary | ICD-10-CM | POA: Diagnosis not present

## 2018-02-23 DIAGNOSIS — M79604 Pain in right leg: Secondary | ICD-10-CM | POA: Insufficient documentation

## 2018-02-23 DIAGNOSIS — M79605 Pain in left leg: Secondary | ICD-10-CM

## 2018-02-23 DIAGNOSIS — I10 Essential (primary) hypertension: Secondary | ICD-10-CM | POA: Diagnosis not present

## 2018-02-23 DIAGNOSIS — E118 Type 2 diabetes mellitus with unspecified complications: Secondary | ICD-10-CM | POA: Diagnosis not present

## 2018-02-23 NOTE — Progress Notes (Signed)
Subjective:    Patient ID: [name], male    DOB: December 14, 1946, 72 y.o.   MRN: [id number] Chief Complaint  Patient presents with  . New Patient (Initial Visit)    ref Lateef bil leg swelling   Presents as a new patient referred by Dr. Holley Raring for evaluation of bilateral lower extremity edema and discomfort.  Patient seen with wife.  The patient notes a worsening to his "restless leg syndrome" over the last year.  The patient states he is unable to keep his legs still which tends to be worse at night.  The only time that he is able to "calm" his legs down is when he walks around.  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  The patient has had an IVC filter placed in the past due to a bleeding event on oral anticoagulation however is now back on Eliquis.  The patient notes progressively worsening bilateral lower extremity edema over the last year.  The patient's edema is worse towards the evening or with sitting and standing for long periods of time.  The patient's edema is associated with some discomfort.  This discomfort also worsens towards the end of the day with sitting and standing for long periods of time.  At this time, the patient does not engage in conservative therapy including wearing medical grade 1 compression stockings or elevating his legs.  The patient denies any recent surgery or trauma to the bilateral lower extremity.  The patient feels that his symptoms have progressed to the point that he is unable to function on a daily basis this is what prompted him to seek medical attention.  The patient denies any fever, nausea vomiting.   Review of Systems  Constitutional: Negative.   HENT: Negative.   Eyes: Negative.   Respiratory: Negative.   Cardiovascular: Positive for leg swelling.       Lower extremity discomfort.   Gastrointestinal: Negative.   Endocrine: Negative.     Genitourinary: Negative.   Musculoskeletal: Negative.   Skin: Negative.   Allergic/Immunologic: Negative.   Neurological: Negative.   Hematological:       Clotting disorder  Psychiatric/Behavioral: Negative.       Objective:   Physical Exam  Constitutional: He is oriented to person, place, and time. He appears well-developed and well-nourished. No distress.  HENT:  Head: Normocephalic and atraumatic.  Eyes: Conjunctivae are normal. Pupils are equal, round, and reactive to light.  Neck: Normal range of motion.  Cardiovascular: Normal rate, regular rhythm, normal heart sounds and intact distal pulses.  Pulses:      Radial pulses are 2+ on the right side, and 2+ on the left side.  Hard to palpate pedal pulses due to body habitus and edema however the bilateral feet are warm  Pulmonary/Chest: Effort normal and breath sounds normal.  Musculoskeletal: Normal range of motion. He exhibits edema (Moderate nonpitting edema noted bilaterally.  Left lower extremity swelling greater than right.).  Neurological: He is alert and oriented to person, place, and time.  Skin: He is not diaphoretic.  Less than 1 cm varicosities noted to the bilateral lower extremity.  There is mild stasis dermatitis.  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  There is no active cellulitis there is no skin thickening there is no ulceration.  Psychiatric: He has a normal mood and affect. His behavior is normal. Judgment and thought  content normal.  Vitals reviewed.  BP 136/73 (BP Location: Right Arm)   Pulse 84   Resp 16   Ht 6' (1.829 m)   Wt 272 lb 9.6 oz (123.7 kg)   BMI 36.97 kg/m   Past Medical History:  Diagnosis Date  . Chronic back pain   . COPD (chronic obstructive pulmonary disease) ([place])   . Depression   . Diabetes mellitus without complication ([place])   . Hyperlipidemia   . Hypertension   . Renal cell carcinoma ([place])   . Sleep apnea    Social History   Socioeconomic History  .  Marital status: Married    Spouse name: Not on file  . Number of children: Not on file  . Years of education: Not on file  . Highest education level: Not on file  Social Needs  . Financial resource strain: Not on file  . Food insecurity - worry: Not on file  . Food insecurity - inability: Not on file  . Transportation needs - medical: Not on file  . Transportation needs - non-medical: Not on file  Occupational History  . Not on file  Tobacco Use  . Smoking status: Former Research scientist (life sciences)  . Smokeless tobacco: Current User    Types: Chew  Substance and Sexual Activity  . Alcohol use: No    Frequency: Never  . Drug use: No  . Sexual activity: Not on file  Other Topics Concern  . Not on file  Social History Narrative  . Not on file   Past Surgical History:  Procedure Laterality Date  . CARPAL TUNNEL RELEASE    . COLONOSCOPY WITH PROPOFOL N/A 07/29/2017   Procedure: COLONOSCOPY WITH PROPOFOL;  Surgeon: Manya Silvas, MD;  Location[facility] ENDOSCOPY;  Service: Endoscopy;  Laterality: N/A;  . colonoscopys    . ESOPHAGOGASTRODUODENOSCOPY (EGD) WITH PROPOFOL N/A 07/29/2017   Procedure: ESOPHAGOGASTRODUODENOSCOPY (EGD) WITH PROPOFOL;  Surgeon: Manya Silvas, MD;  Location: [facility] ENDOSCOPY;  Service: Endoscopy;  Laterality: N/A;  . FLEXIBLE SIGMOIDOSCOPY    . GASTRIC BYPASS    . NEPHRECTOMY    . open reduction and internal fixation, right distal radius    . right knee arthroscopy    . UPPER GI ENDOSCOPY     Family History  Problem Relation Age of Onset  . Diabetes Mother   . Cancer Mother   . Heart disease Father    No Known Allergies     Assessment & Plan:  Presents as a new patient referred by Dr. Holley Raring for evaluation of bilateral lower extremity edema and discomfort.  Patient seen with wife.  The patient notes a worsening to his "restless leg syndrome" over the last year.  The patient states he is unable to keep his legs still which tends to be worse at night.  The only time  that he is able to "calm" his legs down is when he walks around.  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  The patient has had an IVC filter placed in the past due to a bleeding event on oral anticoagulation however is now back on Eliquis.  The patient notes progressively worsening bilateral lower extremity edema over the last year.  The patient's edema is worse towards the evening or with sitting and standing for long periods of time.  The patient's edema is associated with some discomfort.  This discomfort also worsens towards the  end of the day with sitting and standing for long periods of time.  At this time, the patient does not engage in conservative therapy including wearing medical grade 1 compression stockings or elevating his legs.  The patient denies any recent surgery or trauma to the bilateral lower extremity.  The patient feels that his symptoms have progressed to the point that he is unable to function on a daily basis this is what prompted him to seek medical attention.  The patient denies any fever, nausea vomiting.  1. Bilateral lower extremity edema - New The patient was encouraged to wear graduated compression stockings (20-30 mmHg) on a daily basis. The patient was instructed to begin wearing the stockings first thing in the morning and removing them in the evening. The patient was instructed specifically not to sleep in the stockings. Prescription given. In addition, behavioral modification including elevation during the day will be initiated. Anti-inflammatories for pain. The patient will follow up in one months to asses conservative management.  Information on compression stockings was given to the patient. The patient was instructed to call the office in the interim if any worsening edema or ulcerations to the legs, feet or toes occurs. The patient expresses their understanding.  -  VAS [place] LOWER EXTREMITY VENOUS REFLUX; Future  2. Lower extremity pain, bilateral - New Patient with multiple risk factors for peripheral artery disease Unable to palpate pedal pulses on exam Patient with discomfort to the bilateral lower extremity especially at night Will bring the patient at his convenience to undergo an ABI to rule out any contributing peripheral artery disease I have discussed with the patient at length the risk factors for and pathogenesis of atherosclerotic disease and encouraged a healthy diet, regular exercise regimen and blood pressure / glucose control.  The patient was encouraged to call the office in the interim if he experiences any claudication like symptoms, rest pain or ulcers to his feet / toes.  - VAS [place] ABI WITH/WO TBI; Future  3. Essential hypertension - Stable Encouraged good control as its slows the progression of atherosclerotic disease  4. Type 2 diabetes mellitus with complication, unspecified whether long term insulin use (HCC) - Stable Encouraged good control as its slows the progression of atherosclerotic disease  Current Outpatient Medications on File Prior to Visit  Medication Sig Dispense Refill  . Apixaban (ELIQUIS PO) Take 5 mg by mouth daily.     . carvedilol (COREG) 6.25 MG tablet Take 6.25 mg by mouth 2 (two) times daily with a meal.    . enalapril (VASOTEC) 20 MG tablet Take 20 mg by mouth daily.    . furosemide (LASIX) 40 MG tablet Take 40 mg by mouth.    . gabapentin (NEURONTIN) 300 MG capsule Take 300 mg by mouth 3 (three) times daily.    Marland Kitchen glipiZIDE (GLUCOTROL) 5 MG tablet Take by mouth daily before breakfast.    . metolazone (ZAROXOLYN) 2.5 MG tablet     . Multiple Vitamin (MULTIVITAMIN) tablet Take 1 tablet by mouth daily.    . OxyCODONE HCl, Abuse Deter, (OXAYDO) 5 MG TABA Take by mouth as needed.    . pantoprazole (PROTONIX) 40 MG tablet Take 40 mg by mouth daily.    Marland Kitchen PARoxetine (PAXIL) 40 MG tablet Take 40 mg by mouth every  morning.    . potassium chloride SA (K-DUR,KLOR-CON) 20 MEQ tablet Take by mouth.    . pramipexole (MIRAPEX) 0.5 MG tablet Take 0.5 mg by mouth 3 (three) times  daily.    . sodium bicarbonate 650 MG tablet Take 650 mg by mouth 4 (four) times daily.    Marland Kitchen spironolactone (ALDACTONE) 25 MG tablet Take by mouth.    . sucralfate (CARAFATE) 1 g tablet Take 1 g by mouth 4 (four) times daily -  with meals and at bedtime.    Marland Kitchen amLODipine (NORVASC) 10 MG tablet Take 10 mg by mouth daily.    . hydrochlorothiazide (HYDRODIURIL) 25 MG tablet Take 25 mg by mouth daily.    . sodium bicarbonate 650 MG tablet      No current facility-administered medications on file prior to visit.    There are no Patient Instructions on file for this visit. No Follow-up on file.  Cristin Penaflor A Samari Gorby, PA-C

## 2018-03-03 ENCOUNTER — Telehealth (INDEPENDENT_AMBULATORY_CARE_PROVIDER_SITE_OTHER): Payer: Self-pay

## 2018-03-03 NOTE — Telephone Encounter (Signed)
Okay we will get him scheduled for bilateral unna wraps, per request of Dr. Holley Raring.

## 2018-03-03 NOTE — Telephone Encounter (Signed)
Note is a duplicate.

## 2018-03-03 NOTE — Telephone Encounter (Signed)
Dr. Elwyn Lade nurse Anderson Malta called stating that the patient did comply with the compression stocking, but the swelling is worsening and Dr. Holley Raring wants to see if we can bring him in before his scheduled May appointment, to get started on the unna wraps if at all possible or suggested?

## 2018-03-03 NOTE — Telephone Encounter (Signed)
He doesn't need to see any providers. I just saw him. I would assume he is not wearing his compression socks or elevating his legs. Document [name] ordered unna boots to the bilateral lower extremity and have the patient come in and start wraps. His appointment does not need to be moved up.

## 2018-03-04 ENCOUNTER — Encounter (INDEPENDENT_AMBULATORY_CARE_PROVIDER_SITE_OTHER): Payer: Self-pay | Admitting: Vascular Surgery

## 2018-03-04 ENCOUNTER — Ambulatory Visit (INDEPENDENT_AMBULATORY_CARE_PROVIDER_SITE_OTHER): Payer: Medicare Other | Admitting: Vascular Surgery

## 2018-03-04 VITALS — BP 112/64 | HR 78 | Resp 18 | Ht 75.0 in | Wt 266.0 lb

## 2018-03-04 DIAGNOSIS — R600 Localized edema: Secondary | ICD-10-CM

## 2018-03-04 NOTE — Progress Notes (Signed)
History of Present Illness  There is no documented history at this time  Assessments & Plan   There are no diagnoses linked to this encounter.    Additional instructions  Subjective:  Patient presents with venous ulcer of the Bilateral lower extremity.    Procedure:  3 layer unna wrap was placed Bilateral lower extremity.   Plan:   Follow up in one week.  

## 2018-03-11 ENCOUNTER — Encounter (INDEPENDENT_AMBULATORY_CARE_PROVIDER_SITE_OTHER): Payer: Self-pay

## 2018-03-11 ENCOUNTER — Ambulatory Visit (INDEPENDENT_AMBULATORY_CARE_PROVIDER_SITE_OTHER): Payer: Medicare Other | Admitting: Vascular Surgery

## 2018-03-11 VITALS — BP 115/71 | HR 78 | Resp 17 | Ht 73.0 in | Wt 265.0 lb

## 2018-03-11 DIAGNOSIS — R600 Localized edema: Secondary | ICD-10-CM | POA: Diagnosis not present

## 2018-03-11 NOTE — Progress Notes (Signed)
History of Present Illness  There is no documented history at this time  Assessments & Plan   There are no diagnoses linked to this encounter.    Additional instructions  Subjective:  Patient presents with venous ulcer of the Bilateral lower extremity.    Procedure:  3 layer unna wrap was placed Bilateral lower extremity.   Plan:   Follow up in one week.  

## 2018-03-17 DIAGNOSIS — R0602 Shortness of breath: Secondary | ICD-10-CM | POA: Insufficient documentation

## 2018-03-19 ENCOUNTER — Ambulatory Visit (INDEPENDENT_AMBULATORY_CARE_PROVIDER_SITE_OTHER): Payer: Medicare Other | Admitting: Vascular Surgery

## 2018-03-19 ENCOUNTER — Encounter (INDEPENDENT_AMBULATORY_CARE_PROVIDER_SITE_OTHER): Payer: Self-pay | Admitting: Vascular Surgery

## 2018-03-19 VITALS — BP 116/68 | HR 77 | Resp 17 | Ht 73.0 in | Wt 265.0 lb

## 2018-03-19 DIAGNOSIS — E118 Type 2 diabetes mellitus with unspecified complications: Secondary | ICD-10-CM

## 2018-03-19 DIAGNOSIS — M79605 Pain in left leg: Secondary | ICD-10-CM | POA: Diagnosis not present

## 2018-03-19 DIAGNOSIS — I10 Essential (primary) hypertension: Secondary | ICD-10-CM | POA: Diagnosis not present

## 2018-03-19 DIAGNOSIS — M79604 Pain in right leg: Secondary | ICD-10-CM

## 2018-03-19 NOTE — Assessment & Plan Note (Signed)
blood pressure control important in reducing the progression of atherosclerotic disease. On appropriate oral medications.  

## 2018-03-19 NOTE — Assessment & Plan Note (Addendum)
UNNA wraps have helped, but his swelling is still prominent.  We will get a venous reflux study at his convenience in the near future.  Continue to wear compression stockings daily and elevate his legs.  See him back following his venous duplex.

## 2018-03-19 NOTE — Assessment & Plan Note (Signed)
blood glucose control important in reducing the progression of atherosclerotic disease. Also, involved in wound healing. On appropriate medications.  

## 2018-03-19 NOTE — Progress Notes (Signed)
MRN : [id number]  [name] is a 72 y.o. ([birth year]) male who presents with chief complaint of  Chief Complaint  Patient presents with  . Follow-up    Unna check  .  History of Present Illness: Patient returns today in follow up of leg swelling and pain.  He has been wrapped in Unna boots for several weeks and his weeping and ulceration has healed.  His swelling is better.  The legs are still very heavy and achy.  Fever or chills are not present.  Current Outpatient Medications  Medication Sig Dispense Refill  . amLODipine (NORVASC) 10 MG tablet Take 10 mg by mouth daily.    Marland Kitchen Apixaban (ELIQUIS PO) Take 5 mg by mouth daily.     . carvedilol (COREG) 6.25 MG tablet Take 6.25 mg by mouth 2 (two) times daily with a meal.    . enalapril (VASOTEC) 20 MG tablet Take 20 mg by mouth daily.    . furosemide (LASIX) 40 MG tablet Take 40 mg by mouth.    . gabapentin (NEURONTIN) 300 MG capsule Take 300 mg by mouth 3 (three) times daily.    Marland Kitchen glipiZIDE (GLUCOTROL) 5 MG tablet Take by mouth daily before breakfast.    . hydrochlorothiazide (HYDRODIURIL) 25 MG tablet Take 25 mg by mouth daily.    . metolazone (ZAROXOLYN) 2.5 MG tablet     . Multiple Vitamin (MULTIVITAMIN) tablet Take 1 tablet by mouth daily.    . OxyCODONE HCl, Abuse Deter, (OXAYDO) 5 MG TABA Take by mouth as needed.    . pantoprazole (PROTONIX) 40 MG tablet Take 40 mg by mouth daily.    Marland Kitchen PARoxetine (PAXIL) 40 MG tablet Take 40 mg by mouth every morning.    . potassium chloride SA (K-DUR,KLOR-CON) 20 MEQ tablet Take by mouth.    . pramipexole (MIRAPEX) 0.5 MG tablet Take 0.5 mg by mouth 3 (three) times daily.    . sodium bicarbonate 650 MG tablet Take 650 mg by mouth 4 (four) times daily.    Marland Kitchen spironolactone (ALDACTONE) 25 MG tablet Take by mouth.    . sucralfate (CARAFATE) 1 g tablet Take 1 g by mouth 4 (four) times daily -  with meals and at bedtime.    . triamcinolone cream (KENALOG) 0.1 % ARMS AND LEGS TWICE A DAY  FOR ONE WEEK, THEN REDUCE TO ONCE A DAY  0   No current facility-administered medications for this visit.     Past Medical History:  Diagnosis Date  . Chronic back pain   . COPD (chronic obstructive pulmonary disease) ([place])   . Depression   . Diabetes mellitus without complication ([place])   . Hyperlipidemia   . Hypertension   . Renal cell carcinoma ([place])   . Sleep apnea     Past Surgical History:  Procedure Laterality Date  . CARPAL TUNNEL RELEASE    . COLONOSCOPY WITH PROPOFOL N/A 07/29/2017   Procedure: COLONOSCOPY WITH PROPOFOL;  Surgeon: Manya Silvas, MD;  Location: [facility] ENDOSCOPY;  Service: Endoscopy;  Laterality: N/A;  . colonoscopys    . ESOPHAGOGASTRODUODENOSCOPY (EGD) WITH PROPOFOL N/A 07/29/2017   Procedure: ESOPHAGOGASTRODUODENOSCOPY (EGD) WITH PROPOFOL;  Surgeon: Manya Silvas, MD;  Location: [facility] ENDOSCOPY;  Service: Endoscopy;  Laterality: N/A;  . FLEXIBLE SIGMOIDOSCOPY    . GASTRIC BYPASS    . NEPHRECTOMY    . open reduction and internal fixation, right distal radius    . right knee arthroscopy    . UPPER  GI ENDOSCOPY      Social History Social History   Tobacco Use  . Smoking status: Former Research scientist (life sciences)  . Smokeless tobacco: Current User    Types: Chew  Substance Use Topics  . Alcohol use: No    Frequency: Never  . Drug use: No     Family History Family History  Problem Relation Age of Onset  . Diabetes Mother   . Cancer Mother   . Heart disease Father      No Known Allergies   REVIEW OF SYSTEMS (Negative unless checked)  Constitutional: [] Weight loss  [] Fever  [] Chills Cardiac: [] Chest pain   [] Chest pressure   [] Palpitations   [] Shortness of breath when laying flat   [] Shortness of breath at rest   [] Shortness of breath with exertion. Vascular:  [x] Pain in legs with walking   [x] Pain in legs at rest   [] Pain in legs when laying flat   [] Claudication   [] Pain in feet when walking  [] Pain in feet at rest  [] Pain in feet when laying flat    [] History of DVT   [] Phlebitis   [x] Swelling in legs   [] Varicose veins   [] Non-healing ulcers Pulmonary:   [] Uses home oxygen   [] Productive cough   [] Hemoptysis   [] Wheeze  [] COPD   [] Asthma Neurologic:  [] Dizziness  [] Blackouts   [] Seizures   [] History of stroke   [] History of TIA  [] Aphasia   [] Temporary blindness   [] Dysphagia   [] Weakness or numbness in arms   [] Weakness or numbness in legs Musculoskeletal:  [x] Arthritis   [] Joint swelling   [] Joint pain   [] Low back pain Hematologic:  [] Easy bruising  [] Easy bleeding   [] Hypercoagulable state   [] Anemic   Gastrointestinal:  [] Blood in stool   [] Vomiting blood  [] Gastroesophageal reflux/heartburn   [] Abdominal pain Genitourinary:  [] Chronic kidney disease   [] Difficult urination  [] Frequent urination  [] Burning with urination   [] Hematuria Skin:  [] Rashes   [] Ulcers   [] Wounds Psychological:  [] History of anxiety   []  History of major depression.  Physical Examination  BP 116/68 (BP Location: Right Arm, Patient Position: Sitting)   Pulse 77   Resp 17   Ht 6\' 1"  (1.854 m)   Wt 120.2 kg (265 lb)   BMI 34.96 kg/m  Gen:  WD/WN, NAD Head: [place]/AT, No temporalis wasting. Ear/Nose/Throat: Hearing grossly intact, nares w/o erythema or drainage Eyes: Conjunctiva clear. Sclera non-icteric Neck: Supple.  Trachea midline Pulmonary:  Good air movement, no use of accessory muscles.  Cardiac: RRR, no JVD Vascular:  Vessel Right Left  Radial Palpable Palpable                          PT  1+ palpable  not palpable  DP Palpable  1+ palpable    Musculoskeletal: M/S 5/5 throughout.  No deformity or atrophy.  1+ right lower extremity edema, 2+ left lower extremity edema. Neurologic: Sensation grossly intact in extremities.  Symmetrical.  Speech is fluent.  Psychiatric: Judgment intact, Mood & affect appropriate for pt's clinical situation. Dermatologic: No rashes or ulcers noted.  No cellulitis or open wounds.       Labs No results  found for this or any previous visit (from the past 2160 hour(s)).  Radiology No results found.  Assessment/Plan  Type 2 diabetes mellitus with complication (HCC) blood glucose control important in reducing the progression of atherosclerotic disease. Also, involved in wound healing. On appropriate medications.  Essential hypertension blood pressure control important in reducing the progression of atherosclerotic disease. On appropriate oral medications.   Lower extremity pain, bilateral UNNA wraps have helped, but his swelling is still prominent.  We will get a venous reflux study at his convenience in the near future.  Continue to wear compression stockings daily and elevate his legs.  See him back following his venous duplex.    Leotis Pain, MD  03/19/2018 11:29 AM    This note was created with Dragon medical transcription system.  Any errors from dictation are purely unintentional

## 2018-04-07 ENCOUNTER — Encounter

## 2018-04-07 ENCOUNTER — Ambulatory Visit (INDEPENDENT_AMBULATORY_CARE_PROVIDER_SITE_OTHER): Payer: Medicare Other

## 2018-04-07 DIAGNOSIS — M79605 Pain in left leg: Secondary | ICD-10-CM

## 2018-04-07 DIAGNOSIS — R600 Localized edema: Secondary | ICD-10-CM

## 2018-04-07 DIAGNOSIS — M79604 Pain in right leg: Secondary | ICD-10-CM

## 2018-04-09 ENCOUNTER — Encounter (INDEPENDENT_AMBULATORY_CARE_PROVIDER_SITE_OTHER): Payer: Self-pay | Admitting: Vascular Surgery

## 2018-04-09 ENCOUNTER — Ambulatory Visit (INDEPENDENT_AMBULATORY_CARE_PROVIDER_SITE_OTHER): Payer: Medicare Other | Admitting: Vascular Surgery

## 2018-04-09 ENCOUNTER — Encounter

## 2018-04-09 VITALS — BP 112/69 | HR 74 | Resp 16 | Ht 73.0 in | Wt 268.0 lb

## 2018-04-09 DIAGNOSIS — R600 Localized edema: Secondary | ICD-10-CM | POA: Diagnosis not present

## 2018-04-09 DIAGNOSIS — I83893 Varicose veins of bilateral lower extremities with other complications: Secondary | ICD-10-CM

## 2018-04-09 DIAGNOSIS — I10 Essential (primary) hypertension: Secondary | ICD-10-CM | POA: Diagnosis not present

## 2018-04-09 DIAGNOSIS — E118 Type 2 diabetes mellitus with unspecified complications: Secondary | ICD-10-CM | POA: Diagnosis not present

## 2018-04-09 NOTE — Patient Instructions (Signed)
Nonsurgical Procedures for Varicose Veins, Care After This sheet gives you information about how to care for yourself after your procedure. Your health care provider may also give you more specific instructions. If you have problems or questions, contact your health care provider. What can I expect after the procedure? After the procedure, it is common to have:  Swelling.  Bruising.  Soreness.  Mild skin discoloration.  Slight bleeding at the incision sites.  Follow these instructions at home: Incision or puncture site care  Follow instructions from your health care provider about how to take care of your incision or puncture site. Make sure you: ? Wash your hands with soap and water before you change your bandage (dressing). If soap and water are not available, use hand sanitizer. ? Change your dressing as told by your health care provider. ? Leave skin glue or adhesive strips in place. These skin closures may need to stay in place for 2 weeks or longer. If adhesive strip edges start to loosen and curl up, you may trim the loose edges. Do not remove adhesive strips completely unless your health care provider tells you to do that.  Check your incision or puncture area every day for signs of infection. Check for: ? Redness, swelling, or pain. ? Fluid or blood. ? Warmth. ? Pus or a bad smell. General instructions  Take over-the-counter and prescription medicines only as told by your health care provider.  Wear compression stockings as told by your health care provider. These stockings help to prevent blood clots and reduce swelling in your legs.  Do not take baths, swim, or use a hot tub until your health care provider approves. Ask your health care provider if you can take showers.  Wear loose-fitting clothing.  Return to your normal activities as told by your health care provider. Ask your health care provider what activities are safe for you.  Get regular daily exercise. Walk  or ride a stationary bike daily or as told by your health care provider.  Keep all follow-up visits as told by your health care provider. This is important. Contact a health care provider if:  You have a fever.  You have redness, swelling, or pain around your incision or puncture site.  You have fluid or blood coming from your incision or puncture site.  Your incision or puncture site feels warm to the touch.  You have pus or a bad smell coming from your incision or puncture site.  You develop a cough. Get help right away if:  You pass out.  You have very bad pain in your leg.  You have leg pain that gets worse when you walk.  You have redness or swelling in your leg that is getting worse.  You have trouble breathing.  You cough up blood. Summary  After the procedure, it is common to have swelling, bruising, soreness, or mild skin discoloration.  Follow instructions from your health care provider about how to take care of your incision or puncture site.  Wear compression stockings as told by your health care provider. These stockings help to prevent blood clots and reduce swelling in your legs. This information is not intended to replace advice given to you by your health care provider. Make sure you discuss any questions you have with your health care provider. Document Released: 03/13/2017 Document Revised: 03/13/2017 Document Reviewed: 03/13/2017 Elsevier Interactive Patient Education  2018 [employer].

## 2018-04-09 NOTE — Assessment & Plan Note (Signed)
Recommend  I have reviewed my previous  discussion with the patient regarding  varicose veins and why they cause symptoms. Patient will continue  wearing graduated compression stockings class 1 on a daily basis, beginning first thing in the morning and removing them in the evening.  They have already been doing that for some time.  In addition, behavioral modification including elevation during the day was again discussed and this will continue.  The patient has utilized over the counter pain medications and has been exercising.  However, at this time conservative therapy has not alleviated the patient's symptoms of leg pain and swelling  Recommend: laser ablation of the right and  left great saphenous veins to eliminate the symptoms of pain and swelling of the lower extremities caused by the severe superficial venous reflux disease.

## 2018-04-09 NOTE — Progress Notes (Signed)
MRN : [id number]  [name] is a 72 y.o. ([birth year]) male who presents with chief complaint of  Chief Complaint  Patient presents with  . Follow-up    ultrasound results  .  History of Present Illness: Patient returns today in follow up of leg pain and swelling.  Compression stockings and previous Unna boots have helped his pain and swelling although it is still significant.  His left leg is the more severely affected of the 2 legs.  No current ulceration or infection.  He is studied with noninvasive studies today which demonstrate nearly normal ABIs of 0.86 on the right and 0.89 on the left with good waveforms and good digital deflections.  His venous duplex shows long segment great saphenous vein reflux bilaterally as well as reflux in the small saphenous vein at the saphenous popliteal junction bilaterally.  Current Outpatient Medications  Medication Sig Dispense Refill  . amLODipine (NORVASC) 10 MG tablet Take 10 mg by mouth daily.    Marland Kitchen Apixaban (ELIQUIS PO) Take 5 mg by mouth daily.     . carvedilol (COREG) 6.25 MG tablet Take 6.25 mg by mouth 2 (two) times daily with a meal.    . enalapril (VASOTEC) 20 MG tablet Take 20 mg by mouth daily.    . furosemide (LASIX) 40 MG tablet Take 40 mg by mouth.    . gabapentin (NEURONTIN) 300 MG capsule Take 300 mg by mouth 3 (three) times daily.    Marland Kitchen glipiZIDE (GLUCOTROL) 5 MG tablet Take by mouth daily before breakfast.    . hydrochlorothiazide (HYDRODIURIL) 25 MG tablet Take 25 mg by mouth daily.    . metolazone (ZAROXOLYN) 2.5 MG tablet     . Multiple Vitamin (MULTIVITAMIN) tablet Take 1 tablet by mouth daily.    . OxyCODONE HCl, Abuse Deter, (OXAYDO) 5 MG TABA Take by mouth as needed.    . pantoprazole (PROTONIX) 40 MG tablet Take 40 mg by mouth daily.    Marland Kitchen PARoxetine (PAXIL) 40 MG tablet Take 40 mg by mouth every morning.    . potassium chloride SA (K-DUR,KLOR-CON) 20 MEQ tablet Take by mouth.    . pramipexole (MIRAPEX) 0.5  MG tablet Take 0.5 mg by mouth 3 (three) times daily.    . sodium bicarbonate 650 MG tablet Take 650 mg by mouth 4 (four) times daily.    Marland Kitchen spironolactone (ALDACTONE) 25 MG tablet Take by mouth.    . sucralfate (CARAFATE) 1 g tablet Take 1 g by mouth 4 (four) times daily -  with meals and at bedtime.    . triamcinolone cream (KENALOG) 0.1 % ARMS AND LEGS TWICE A DAY FOR ONE WEEK, THEN REDUCE TO ONCE A DAY  0   No current facility-administered medications for this visit.     Past Medical History:  Diagnosis Date  . Chronic back pain   . COPD (chronic obstructive pulmonary disease) ([place])   . Depression   . Diabetes mellitus without complication ([place])   . Hyperlipidemia   . Hypertension   . Renal cell carcinoma ([place])   . Sleep apnea     Past Surgical History:  Procedure Laterality Date  . CARPAL TUNNEL RELEASE    . COLONOSCOPY WITH PROPOFOL N/A 07/29/2017   Procedure: COLONOSCOPY WITH PROPOFOL;  Surgeon: Manya Silvas, MD;  Location: [facility] ENDOSCOPY;  Service: Endoscopy;  Laterality: N/A;  . colonoscopys    . ESOPHAGOGASTRODUODENOSCOPY (EGD) WITH PROPOFOL N/A 07/29/2017   Procedure: ESOPHAGOGASTRODUODENOSCOPY (EGD) WITH PROPOFOL;  Surgeon: Manya Silvas, MD;  Location: [facility] ENDOSCOPY;  Service: Endoscopy;  Laterality: N/A;  . FLEXIBLE SIGMOIDOSCOPY    . GASTRIC BYPASS    . NEPHRECTOMY    . open reduction and internal fixation, right distal radius    . right knee arthroscopy    . UPPER GI ENDOSCOPY      Social History Social History        Tobacco Use  . Smoking status: Former Research scientist (life sciences)  . Smokeless tobacco: Current User    Types: Chew  Substance Use Topics  . Alcohol use: No    Frequency: Never  . Drug use: No     Family History      Family History  Problem Relation Age of Onset  . Diabetes Mother   . Cancer Mother   . Heart disease Father      No Known Allergies   REVIEW OF SYSTEMS (Negative unless checked)  Constitutional: [] Weight  loss  [] Fever  [] Chills Cardiac: [] Chest pain   [] Chest pressure   [] Palpitations   [] Shortness of breath when laying flat   [] Shortness of breath at rest   [] Shortness of breath with exertion. Vascular:  [x] Pain in legs with walking   [x] Pain in legs at rest   [] Pain in legs when laying flat   [] Claudication   [] Pain in feet when walking  [] Pain in feet at rest  [] Pain in feet when laying flat   [] History of DVT   [] Phlebitis   [x] Swelling in legs   [] Varicose veins   [] Non-healing ulcers Pulmonary:   [] Uses home oxygen   [] Productive cough   [] Hemoptysis   [] Wheeze  [] COPD   [] Asthma Neurologic:  [] Dizziness  [] Blackouts   [] Seizures   [] History of stroke   [] History of TIA  [] Aphasia   [] Temporary blindness   [] Dysphagia   [] Weakness or numbness in arms   [] Weakness or numbness in legs Musculoskeletal:  [x] Arthritis   [] Joint swelling   [] Joint pain   [] Low back pain Hematologic:  [] Easy bruising  [] Easy bleeding   [] Hypercoagulable state   [] Anemic   Gastrointestinal:  [] Blood in stool   [] Vomiting blood  [] Gastroesophageal reflux/heartburn   [] Abdominal pain Genitourinary:  [] Chronic kidney disease   [] Difficult urination  [] Frequent urination  [] Burning with urination   [] Hematuria Skin:  [] Rashes   [] Ulcers   [] Wounds Psychological:  [] History of anxiety   []  History of major depression.     Physical Examination  BP 112/69 (BP Location: Right Arm)   Pulse 74   Resp 16   Ht 6\' 1"  (1.854 m)   Wt 121.6 kg (268 lb)   BMI 35.36 kg/m  Gen:  WD/WN, NAD Head: [place]/AT, No temporalis wasting. Ear/Nose/Throat: Hearing grossly intact, nares w/o erythema or drainage Eyes: Conjunctiva clear. Sclera non-icteric Neck: Supple.  Trachea midline Pulmonary:  Good air movement, no use of accessory muscles.  Cardiac: RRR, no JVD Vascular:  Vessel Right Left  Radial Palpable Palpable                          PT  1+ palpable  1+ palpable  DP Palpable Palpable    Musculoskeletal: M/S 5/5  throughout.  No deformity or atrophy.  1+ right lower extremity edema, 1-2+ left lower extremity edema. Neurologic: Sensation grossly intact in extremities.  Symmetrical.  Speech is fluent.  Psychiatric: Judgment intact, Mood & affect appropriate for pt's clinical situation. Dermatologic: No rashes or ulcers noted.  No cellulitis or open wounds.       Labs No results found for this or any previous visit (from the past 2160 hour(s)).  Radiology No results found.  Assessment/Plan  Type 2 diabetes mellitus with complication (HCC) blood glucose control important in reducing the progression of atherosclerotic disease. Also, involved in wound healing. On appropriate medications.   Essential hypertension blood pressure control important in reducing the progression of atherosclerotic disease. On appropriate oral medications.   Bilateral lower extremity edema Venous disease is likely a major contributing factor.  See plan as below  Varicose veins of leg with swelling, bilateral Recommend  I have reviewed my previous  discussion with the patient regarding  varicose veins and why they cause symptoms. Patient will continue  wearing graduated compression stockings class 1 on a daily basis, beginning first thing in the morning and removing them in the evening.  They have already been doing that for some time.  In addition, behavioral modification including elevation during the day was again discussed and this will continue.  The patient has utilized over the counter pain medications and has been exercising.  However, at this time conservative therapy has not alleviated the patient's symptoms of leg pain and swelling  Recommend: laser ablation of the right and  left great saphenous veins to eliminate the symptoms of pain and swelling of the lower extremities caused by the severe superficial venous reflux disease.     Leotis Pain, MD  04/09/2018 11:28 AM    This note was created  with Dragon medical transcription system.  Any errors from dictation are purely unintentional

## 2018-04-09 NOTE — Assessment & Plan Note (Signed)
Venous disease is likely a major contributing factor.  See plan as below

## 2018-04-20 ENCOUNTER — Encounter (INDEPENDENT_AMBULATORY_CARE_PROVIDER_SITE_OTHER): Payer: Medicare Other

## 2018-04-20 ENCOUNTER — Ambulatory Visit (INDEPENDENT_AMBULATORY_CARE_PROVIDER_SITE_OTHER): Payer: Medicare Other | Admitting: Vascular Surgery

## 2018-04-26 DIAGNOSIS — N189 Chronic kidney disease, unspecified: Secondary | ICD-10-CM

## 2018-04-26 DIAGNOSIS — D631 Anemia in chronic kidney disease: Secondary | ICD-10-CM | POA: Insufficient documentation

## 2018-04-26 NOTE — Progress Notes (Signed)
[facility]  [phone] [phone]  ID: Keelen Quevedo OB: 1946-10-04  MR#: [id number]  JJK#:[id number]  Patient Care Team: Derinda Late, MD as PCP - General (Family Medicine)  CHIEF COMPLAINT: Anemia in chronic kidney disease, mild iron deficiency.  INTERVAL HISTORY: Patient is a 72 year old male with chronic renal insufficiency who was noted to have a declining hemoglobin on routine blood work.  He has persistent weakness and fatigue.  He also has bilateral lower leg pain and has been evaluated by vascular surgery.  He complains of dyspnea on exertion as well.  He has no neurologic complaints.  He denies any chest pain or cough.  He denies any abdominal pain.  He has no nausea, vomiting, constipation, or diarrhea.  He has no urinary complaints.  Patient otherwise feels well and offers no further specific complaints today.  REVIEW OF SYSTEMS:   Review of Systems  Constitutional: Positive for malaise/fatigue. Negative for fever and weight loss.  Respiratory: Positive for shortness of breath. Negative for cough and hemoptysis.   Cardiovascular: Negative.  Negative for chest pain and leg swelling.  Gastrointestinal: Negative.  Negative for abdominal pain and constipation.  Genitourinary: Negative.  Negative for hematuria.  Musculoskeletal: Positive for back pain.  Skin: Negative.  Negative for rash.  Neurological: Positive for weakness. Negative for sensory change and focal weakness.  Psychiatric/Behavioral: Negative.  The patient is not nervous/anxious.     As per HPI. Otherwise, a complete review of systems is negative.  PAST MEDICAL HISTORY: Past Medical History:  Diagnosis Date  . Chronic back pain   . COPD (chronic obstructive pulmonary disease) ([place])   . Depression   . Diabetes mellitus without complication ([place])   . Hyperlipidemia   . Hypertension   . Renal cell carcinoma ([place])   . Sleep apnea     PAST SURGICAL  HISTORY: Past Surgical History:  Procedure Laterality Date  . CARPAL TUNNEL RELEASE    . CHOLECYSTECTOMY    . COLONOSCOPY WITH PROPOFOL N/A 07/29/2017   Procedure: COLONOSCOPY WITH PROPOFOL;  Surgeon: Manya Silvas, MD;  Location[facility] ENDOSCOPY;  Service: Endoscopy;  Laterality: N/A;  . colonoscopys    . ESOPHAGOGASTRODUODENOSCOPY (EGD) WITH PROPOFOL N/A 07/29/2017   Procedure: ESOPHAGOGASTRODUODENOSCOPY (EGD) WITH PROPOFOL;  Surgeon: Manya Silvas, MD;  Location: [facility] ENDOSCOPY;  Service: Endoscopy;  Laterality: N/A;  . FLEXIBLE SIGMOIDOSCOPY    . GASTRIC BYPASS    . NEPHRECTOMY    . open reduction and internal fixation, right distal radius    . right knee arthroscopy    . UPPER GI ENDOSCOPY      FAMILY HISTORY: Family History  Problem Relation Age of Onset  . Diabetes Mother   . Cancer Mother   . Heart disease Father     ADVANCED DIRECTIVES (Y/N):  N  HEALTH MAINTENANCE: Social History   Tobacco Use  . Smoking status: Former Smoker    Packs/day: 1.00    Years: 30.00    Pack years: 30.00  . Smokeless tobacco: Current User    Types: Chew  Substance Use Topics  . Alcohol use: No    Frequency: Never  . Drug use: No     Colonoscopy:  PAP:  Bone density:  Lipid panel:  No Known Allergies  Current Outpatient Medications  Medication Sig Dispense Refill  . Apixaban (ELIQUIS PO) Take 5 mg by mouth daily.     . carvedilol (COREG) 6.25 MG tablet Take 6.25 mg by mouth 2 (two)  times daily with a meal.    . furosemide (LASIX) 40 MG tablet Take 40 mg by mouth 2 (two) times daily.     Marland Kitchen gabapentin (NEURONTIN) 300 MG capsule Take 300 mg by mouth at bedtime.     Marland Kitchen glipiZIDE (GLUCOTROL) 5 MG tablet Take by mouth daily before breakfast.    . Multiple Vitamin (MULTIVITAMIN) tablet Take 1 tablet by mouth daily.    . pantoprazole (PROTONIX) 40 MG tablet Take 40 mg by mouth daily.    Marland Kitchen PARoxetine (PAXIL) 40 MG tablet Take 40 mg by mouth every morning.     . potassium  chloride SA (K-DUR,KLOR-CON) 20 MEQ tablet Take 20 mEq by mouth daily.     . pramipexole (MIRAPEX) 0.5 MG tablet Take 1 mg by mouth 1 day or 1 dose.     . spironolactone (ALDACTONE) 25 MG tablet Take 25 mg by mouth daily.     . sucralfate (CARAFATE) 1 g tablet Take 1 g by mouth 4 (four) times daily -  with meals and at bedtime.    Marland Kitchen amLODipine (NORVASC) 10 MG tablet Take 10 mg by mouth daily.    . enalapril (VASOTEC) 20 MG tablet Take 20 mg by mouth daily.    . hydrochlorothiazide (HYDRODIURIL) 25 MG tablet Take 25 mg by mouth daily.    . metolazone (ZAROXOLYN) 2.5 MG tablet     . sodium bicarbonate 650 MG tablet Take 650 mg by mouth 4 (four) times daily.    Marland Kitchen triamcinolone cream (KENALOG) 0.1 % ARMS AND LEGS TWICE A DAY FOR ONE WEEK, THEN REDUCE TO ONCE A DAY  0   No current facility-administered medications for this visit.     OBJECTIVE: Vitals:   04/29/18 1404  BP: 127/62  Pulse: 84  Resp: 18  Temp: 98.5 F (36.9 C)     Body mass index is 35.66 kg/m.    ECOG FS:0 - Asymptomatic  General: Well-developed, well-nourished, no acute distress. Eyes: Pink conjunctiva, anicteric sclera. HEENT: Normocephalic, moist mucous membranes, clear oropharnyx. Lungs: Clear to auscultation bilaterally. Heart: Regular rate and rhythm. No rubs, murmurs, or gallops. Abdomen: Soft, nontender, nondistended. No organomegaly noted, normoactive bowel sounds. Musculoskeletal: No edema, cyanosis, or clubbing. Neuro: Alert, answering all questions appropriately. Cranial nerves grossly intact. Skin: No rashes or petechiae noted. Psych: Normal affect. Lymphatics: No cervical, calvicular, axillary or inguinal LAD.   LAB RESULTS:  Lab Results  Component Value Date   NA 142 01/10/2015   K 4.3 01/10/2015   CL 110 (H) 01/10/2015   CO2 25 01/10/2015   GLUCOSE 144 (H) 01/10/2015   BUN 24 (H) 01/10/2015   CREATININE 1.13 01/10/2015   CALCIUM 9.2 01/10/2015   PROT 6.9 06/22/2014   ALBUMIN 3.2 (L)  06/22/2014   AST 10 (L) 06/22/2014   ALT 18 06/22/2014   ALKPHOS 104 06/22/2014   BILITOT 0.4 06/22/2014   GFRNONAA >60 01/10/2015   GFRAA >60 01/10/2015    Lab Results  Component Value Date   WBC 8.1 01/10/2015   NEUTROABS 6.8 (H) 06/24/2014   HGB 12.5 (L) 01/10/2015   HCT 38.2 (L) 01/10/2015   MCV 91 01/10/2015   PLT 200 01/10/2015     STUDIES: No results found.  ASSESSMENT: Anemia in chronic kidney disease, mild iron deficiency.  PLAN:    1. Anemia in chronic kidney disease, mild iron deficiency: Patient's hemoglobin was noted to be mildly decreased at 10.3 with a decreased iron saturation of 13%.  Will initially  proceed with 510 mg of IV Feraheme in 1 and 2 weeks.  Patient may benefit from Procrit in the future, but this is not necessary now as long as his hemoglobin remains above 10.0.  Can consider full anemia work-up in the future if necessary.  Return to clinic as above for Feraheme and then in 3 months with repeat laboratory work and further evaluation. 2.  Chronic renal insufficiency: Stage III.  Continue treatment and monitoring per nephrology.  Approximately 45 minutes was spent in discussion of which greater than 50% was consultation.  Patient expressed understanding and was in agreement with this plan. He also understands that He can call clinic at any time with any questions, concerns, or complaints.    Lloyd Huger, MD   05/01/2018 8:36 AM

## 2018-04-29 ENCOUNTER — Inpatient Hospital Stay: Payer: Medicare Other | Attending: Oncology | Admitting: Oncology

## 2018-04-29 ENCOUNTER — Other Ambulatory Visit: Payer: Self-pay

## 2018-04-29 ENCOUNTER — Encounter: Payer: Self-pay | Admitting: Oncology

## 2018-04-29 DIAGNOSIS — Z87891 Personal history of nicotine dependence: Secondary | ICD-10-CM | POA: Insufficient documentation

## 2018-04-29 DIAGNOSIS — E785 Hyperlipidemia, unspecified: Secondary | ICD-10-CM | POA: Diagnosis not present

## 2018-04-29 DIAGNOSIS — E119 Type 2 diabetes mellitus without complications: Secondary | ICD-10-CM | POA: Insufficient documentation

## 2018-04-29 DIAGNOSIS — D631 Anemia in chronic kidney disease: Secondary | ICD-10-CM | POA: Insufficient documentation

## 2018-04-29 DIAGNOSIS — N183 Chronic kidney disease, stage 3 (moderate): Secondary | ICD-10-CM | POA: Insufficient documentation

## 2018-04-29 DIAGNOSIS — N189 Chronic kidney disease, unspecified: Secondary | ICD-10-CM

## 2018-04-29 DIAGNOSIS — I129 Hypertensive chronic kidney disease with stage 1 through stage 4 chronic kidney disease, or unspecified chronic kidney disease: Secondary | ICD-10-CM | POA: Diagnosis not present

## 2018-04-29 DIAGNOSIS — D509 Iron deficiency anemia, unspecified: Secondary | ICD-10-CM

## 2018-04-29 NOTE — Progress Notes (Signed)
Here for new pt eval. Per pt has been having bilateral leg pain for 3 mo. Diff walking and stabbing pain w ambulation per pt they told me " I have leaking veins in my legs " scheduled for venous surgery May 28/19  -which he hopes will relieve pain. Per pt  Having SOB and weakness x 3 mo. Pulse ox 96%on RA

## 2018-05-01 DIAGNOSIS — D509 Iron deficiency anemia, unspecified: Secondary | ICD-10-CM | POA: Insufficient documentation

## 2018-05-05 ENCOUNTER — Inpatient Hospital Stay: Payer: Medicare Other

## 2018-05-05 VITALS — BP 111/69 | HR 86 | Resp 20

## 2018-05-05 DIAGNOSIS — I129 Hypertensive chronic kidney disease with stage 1 through stage 4 chronic kidney disease, or unspecified chronic kidney disease: Secondary | ICD-10-CM | POA: Diagnosis not present

## 2018-05-05 DIAGNOSIS — D509 Iron deficiency anemia, unspecified: Secondary | ICD-10-CM

## 2018-05-05 MED ORDER — SODIUM CHLORIDE 0.9 % IV SOLN
Freq: Once | INTRAVENOUS | Status: AC
  Administered 2018-05-05: 14:00:00 via INTRAVENOUS
  Filled 2018-05-05: qty 1000

## 2018-05-05 MED ORDER — SODIUM CHLORIDE 0.9 % IV SOLN
510.0000 mg | Freq: Once | INTRAVENOUS | Status: AC
  Administered 2018-05-05: 510 mg via INTRAVENOUS
  Filled 2018-05-05: qty 17

## 2018-05-11 ENCOUNTER — Ambulatory Visit (INDEPENDENT_AMBULATORY_CARE_PROVIDER_SITE_OTHER): Payer: Medicare Other | Admitting: Vascular Surgery

## 2018-05-11 ENCOUNTER — Encounter (INDEPENDENT_AMBULATORY_CARE_PROVIDER_SITE_OTHER): Payer: Self-pay | Admitting: Vascular Surgery

## 2018-05-11 VITALS — BP 89/54 | HR 73 | Resp 16 | Ht 73.0 in | Wt 267.0 lb

## 2018-05-11 DIAGNOSIS — I83893 Varicose veins of bilateral lower extremities with other complications: Secondary | ICD-10-CM

## 2018-05-11 DIAGNOSIS — E118 Type 2 diabetes mellitus with unspecified complications: Secondary | ICD-10-CM

## 2018-05-11 NOTE — Progress Notes (Signed)
Varicose veins of leg with swelling, bilateral     The patient's right lower extremity was sterilely prepped and draped. The ultrasound machine was used to visualize the saphenous vein throughout its course. A segment in the upper calf was selected for access. The saphenous vein was accessed without difficulty using ultrasound guidance with a micro puncture needle. A micro puncture wire and sheath were then placed. A 0.018 wire was placed beyond the saphenofemoral junction through the sheath and the micro puncture sheath was removed. The 65 cm sheath was then placed over the wire and the wire and dilator were removed. The laser fiber was placed through the sheath and its tip was placed approximately 4-5 cm below the saphenofemoral junction. Tumescent anesthesia was then created with a dilute lidocaine solution. Laser energy was then delivered with constant withdrawal of the sheath and laser fiber. Approximately 1282 joules of energy were delivered over a length of 37 cm using the 1470 Hz VenaCure machine at [employer]. Sterile dressings were placed. The patient tolerated the procedure well without complications.

## 2018-05-12 ENCOUNTER — Inpatient Hospital Stay: Payer: Medicare Other

## 2018-05-12 VITALS — BP 116/71 | HR 81 | Temp 97.4°F | Resp 20

## 2018-05-12 DIAGNOSIS — D509 Iron deficiency anemia, unspecified: Secondary | ICD-10-CM

## 2018-05-12 DIAGNOSIS — I129 Hypertensive chronic kidney disease with stage 1 through stage 4 chronic kidney disease, or unspecified chronic kidney disease: Secondary | ICD-10-CM | POA: Diagnosis not present

## 2018-05-12 MED ORDER — SODIUM CHLORIDE 0.9 % IV SOLN
Freq: Once | INTRAVENOUS | Status: AC
  Administered 2018-05-12: 14:00:00 via INTRAVENOUS
  Filled 2018-05-12: qty 1000

## 2018-05-12 MED ORDER — SODIUM CHLORIDE 0.9 % IV SOLN
510.0000 mg | Freq: Once | INTRAVENOUS | Status: AC
  Administered 2018-05-12: 510 mg via INTRAVENOUS
  Filled 2018-05-12: qty 17

## 2018-05-14 ENCOUNTER — Ambulatory Visit (INDEPENDENT_AMBULATORY_CARE_PROVIDER_SITE_OTHER): Payer: Medicare Other

## 2018-05-14 DIAGNOSIS — I83892 Varicose veins of left lower extremities with other complications: Secondary | ICD-10-CM | POA: Diagnosis not present

## 2018-05-14 DIAGNOSIS — I83893 Varicose veins of bilateral lower extremities with other complications: Secondary | ICD-10-CM

## 2018-05-25 ENCOUNTER — Ambulatory Visit (INDEPENDENT_AMBULATORY_CARE_PROVIDER_SITE_OTHER): Payer: Medicare Other | Admitting: Vascular Surgery

## 2018-05-25 ENCOUNTER — Encounter (INDEPENDENT_AMBULATORY_CARE_PROVIDER_SITE_OTHER): Payer: Self-pay | Admitting: Vascular Surgery

## 2018-05-25 VITALS — BP 103/60 | HR 75 | Resp 16 | Ht 73.0 in | Wt 268.0 lb

## 2018-05-25 DIAGNOSIS — I83892 Varicose veins of left lower extremities with other complications: Secondary | ICD-10-CM | POA: Diagnosis not present

## 2018-05-25 DIAGNOSIS — I83893 Varicose veins of bilateral lower extremities with other complications: Secondary | ICD-10-CM

## 2018-05-25 NOTE — Progress Notes (Signed)
Varicose veins of leg with swelling, bilateral   The patient's left lower extremity was sterilely prepped and draped. The ultrasound machine was used to visualize the saphenous vein throughout its course. A segment in the upper calf was selected for access. The saphenous vein was accessed without difficulty using ultrasound guidance with a micro puncture needle. A micro puncture wire and sheath were then placed. A 0.018 wire was placed beyond the saphenofemoral junction through the sheath and the micro puncture sheath was removed. The 65 cm sheath was then placed over the wire and the wire and dilator were removed. The laser fiber was placed through the sheath and its tip was placed approximately 4 cm below the saphenofemoral junction. Tumescent anesthesia was then created with a dilute lidocaine solution. Laser energy was then delivered with constant withdrawal of the sheath and laser fiber. Approximately 1448 Joules of energy were delivered over a length of 39 cm using the 1470 Hz VenaCure machine at [employer]. Sterile dressings were placed. The patient tolerated the procedure well without complications.

## 2018-05-28 ENCOUNTER — Ambulatory Visit (INDEPENDENT_AMBULATORY_CARE_PROVIDER_SITE_OTHER): Payer: Medicare Other

## 2018-05-28 DIAGNOSIS — I83892 Varicose veins of left lower extremities with other complications: Secondary | ICD-10-CM | POA: Diagnosis not present

## 2018-05-28 DIAGNOSIS — I83893 Varicose veins of bilateral lower extremities with other complications: Secondary | ICD-10-CM

## 2018-06-15 ENCOUNTER — Other Ambulatory Visit (INDEPENDENT_AMBULATORY_CARE_PROVIDER_SITE_OTHER): Payer: Medicare Other | Admitting: Vascular Surgery

## 2018-06-18 ENCOUNTER — Encounter (INDEPENDENT_AMBULATORY_CARE_PROVIDER_SITE_OTHER): Payer: Medicare Other

## 2018-06-22 ENCOUNTER — Ambulatory Visit (INDEPENDENT_AMBULATORY_CARE_PROVIDER_SITE_OTHER): Payer: Medicare Other | Admitting: Vascular Surgery

## 2018-06-22 ENCOUNTER — Encounter (INDEPENDENT_AMBULATORY_CARE_PROVIDER_SITE_OTHER): Payer: Self-pay | Admitting: Vascular Surgery

## 2018-06-22 VITALS — BP 125/69 | HR 65 | Resp 16 | Ht 75.0 in | Wt 272.0 lb

## 2018-06-22 DIAGNOSIS — I890 Lymphedema, not elsewhere classified: Secondary | ICD-10-CM

## 2018-06-22 DIAGNOSIS — E118 Type 2 diabetes mellitus with unspecified complications: Secondary | ICD-10-CM

## 2018-06-22 DIAGNOSIS — I83893 Varicose veins of bilateral lower extremities with other complications: Secondary | ICD-10-CM

## 2018-06-22 DIAGNOSIS — I10 Essential (primary) hypertension: Secondary | ICD-10-CM

## 2018-06-22 NOTE — Assessment & Plan Note (Signed)
Improved but not resolved after laser ablation bilaterally.  Some pain and swelling persists and there is likely a component of lymphedema from chronic scarring of the lymphatic channels.  I think a lymphedema pump would be an excellent adjuvant option at this point.

## 2018-06-22 NOTE — Patient Instructions (Signed)

## 2018-06-22 NOTE — Progress Notes (Addendum)
MRN : [id number]  [name] is a 72 y.o. (10/13/1946) male who presents with chief complaint of  Chief Complaint  Patient presents with  . Follow-up    3-4 week post laser  .  History of Present Illness: Patient returns today in follow up of leg swelling and pain.  He has undergone bilateral great saphenous vein laser ablations with good results but not resolution of his symptoms.  He says if it felt like there was a 10 pound weight on his legs before, there is now about a 5 pound weight.  He has noticed improvement but not resolution of his swelling.  He has had chronic pain and swelling now for many years, and there is likely scarring in the lymphatic channels at this point.  Both ablations were successful without DVT.  He has continue to wear compression stockings and is doing so today.  Current Outpatient Medications  Medication Sig Dispense Refill  . amLODipine (NORVASC) 10 MG tablet Take 10 mg by mouth daily.    Marland Kitchen Apixaban (ELIQUIS PO) Take 5 mg by mouth daily.     . carvedilol (COREG) 6.25 MG tablet Take 6.25 mg by mouth 2 (two) times daily with a meal.    . enalapril (VASOTEC) 20 MG tablet Take 20 mg by mouth daily.    . furosemide (LASIX) 40 MG tablet Take 40 mg by mouth.    . gabapentin (NEURONTIN) 300 MG capsule Take 300 mg by mouth 3 (three) times daily.    Marland Kitchen glipiZIDE (GLUCOTROL) 5 MG tablet Take by mouth daily before breakfast.    . hydrochlorothiazide (HYDRODIURIL) 25 MG tablet Take 25 mg by mouth daily.    . metolazone (ZAROXOLYN) 2.5 MG tablet     . Multiple Vitamin (MULTIVITAMIN) tablet Take 1 tablet by mouth daily.    . OxyCODONE HCl, Abuse Deter, (OXAYDO) 5 MG TABA Take by mouth as needed.    . pantoprazole (PROTONIX) 40 MG tablet Take 40 mg by mouth daily.    Marland Kitchen PARoxetine (PAXIL) 40 MG tablet Take 40 mg by mouth every morning.    . potassium chloride SA (K-DUR,KLOR-CON) 20 MEQ tablet Take by mouth.    . pramipexole  (MIRAPEX) 0.5 MG tablet Take 0.5 mg by mouth 3 (three) times daily.    . sodium bicarbonate 650 MG tablet Take 650 mg by mouth 4 (four) times daily.    Marland Kitchen spironolactone (ALDACTONE) 25 MG tablet Take by mouth.    . sucralfate (CARAFATE) 1 g tablet Take 1 g by mouth 4 (four) times daily -  with meals and at bedtime.    . triamcinolone cream (KENALOG) 0.1 % ARMS AND LEGS TWICE A DAY FOR ONE WEEK, THEN REDUCE TO ONCE A DAY  0   No current facility-administered medications for this visit.         Past Medical History:  Diagnosis Date  . Chronic back pain   . COPD (chronic obstructive pulmonary disease) ([place])   . Depression   . Diabetes mellitus without complication ([place])   . Hyperlipidemia   . Hypertension   . Renal cell carcinoma ([place])   . Sleep apnea          Past Surgical History:  Procedure Laterality Date  . CARPAL TUNNEL RELEASE    . COLONOSCOPY WITH PROPOFOL N/A 07/29/2017   Procedure: COLONOSCOPY WITH PROPOFOL;  Surgeon: Manya Silvas, MD;  Location: [facility] ENDOSCOPY;  Service: Endoscopy;  Laterality: N/A;  . colonoscopys    .  ESOPHAGOGASTRODUODENOSCOPY (EGD) WITH PROPOFOL N/A 07/29/2017   Procedure: ESOPHAGOGASTRODUODENOSCOPY (EGD) WITH PROPOFOL;  Surgeon: Manya Silvas, MD;  Location: [facility] ENDOSCOPY;  Service: Endoscopy;  Laterality: N/A;  . FLEXIBLE SIGMOIDOSCOPY    . GASTRIC BYPASS    . NEPHRECTOMY    . open reduction and internal fixation, right distal radius    . right knee arthroscopy    . UPPER GI ENDOSCOPY      Social History Social History        Tobacco Use  . Smoking status: Former Research scientist (life sciences)  . Smokeless tobacco: Current User    Types: Chew  Substance Use Topics  . Alcohol use: No    Frequency: Never  . Drug use: No     Family History      Family History  Problem Relation Age of Onset  . Diabetes Mother   . Cancer Mother   . Heart disease Father      No Known  Allergies   REVIEW OF SYSTEMS(Negative unless checked)  Constitutional: [] Weight loss [] Fever [] Chills Cardiac: [] Chest pain [] Chest pressure [] Palpitations [] Shortness of breath when laying flat [] Shortness of breath at rest [] Shortness of breath with exertion. Vascular: [x] Pain in legs with walking [x] Pain in legs at rest [] Pain in legs when laying flat [] Claudication [] Pain in feet when walking [] Pain in feet at rest [] Pain in feet when laying flat [] History of DVT [] Phlebitis [x] Swelling in legs [] Varicose veins [] Non-healing ulcers Pulmonary: [] Uses home oxygen [] Productive cough [] Hemoptysis [] Wheeze [] COPD [] Asthma Neurologic: [] Dizziness [] Blackouts [] Seizures [] History of stroke [] History of TIA [] Aphasia [] Temporary blindness [] Dysphagia [] Weakness or numbness in arms [] Weakness or numbness in legs Musculoskeletal: [x] Arthritis [] Joint swelling [] Joint pain [] Low back pain Hematologic: [] Easy bruising [] Easy bleeding [] Hypercoagulable state [] Anemic  Gastrointestinal: [] Blood in stool [] Vomiting blood [] Gastroesophageal reflux/heartburn [] Abdominal pain Genitourinary: [] Chronic kidney disease [] Difficult urination [] Frequent urination [] Burning with urination [] Hematuria Skin: [] Rashes [] Ulcers [] Wounds Psychological: [] History of anxiety [] History of major depression.    Physical Examination  BP 125/69 (BP Location: Right Arm, Patient Position: Sitting)   Pulse 65   Resp 16   Ht 6\' 3"  (1.905 m)   Wt 272 lb (123.4 kg)   BMI 34.00 kg/m  Gen:  WD/WN, NAD Head: [place]/AT, No temporalis wasting. Ear/Nose/Throat: Hearing grossly intact, nares w/o erythema or drainage Eyes: Conjunctiva clear. Sclera non-icteric Neck: Supple.  Trachea midline Pulmonary:  Good air movement, no use of accessory muscles.  Cardiac: RRR, no JVD Vascular:  Vessel Right Left  Radial Palpable Palpable                                     Musculoskeletal: M/S 5/5 throughout.  No deformity or atrophy.  1+ right lower extremity edema, 1-2+ left lower extremity edema. Neurologic: Sensation grossly intact in extremities.  Symmetrical.  Speech is fluent.  Psychiatric: Judgment intact, Mood & affect appropriate for pt's clinical situation. Dermatologic: No rashes or ulcers noted.  No cellulitis or open wounds.       Labs No results found for this or any previous visit (from the past 2160 hour(s)).  Radiology No results found.  Assessment/Plan Type 2 diabetes mellitus with complication (HCC) blood glucose control important in reducing the progression of atherosclerotic disease. Also, involved in wound healing. On appropriate medications.   Essential hypertension blood pressure control important in reducing the progression of atherosclerotic disease. On appropriate oral medications.   Varicose veins of leg with  swelling, bilateral Improved but not resolved after laser ablation bilaterally.  Some pain and swelling persists and there is likely a component of lymphedema from chronic scarring of the lymphatic channels.  I think a lymphedema pump would be an excellent adjuvant option at this point.  Lymphedema Improved but not resolved after laser ablation bilaterally.  Some pain and swelling persists and there is likely a component of lymphedema from chronic scarring of the lymphatic channels.  I think a lymphedema pump would be an excellent adjuvant option at this point. 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  He has had improvement with treatment of his venous disease but not resolution.  I will plan to see him back in 3 to 4 months after he has had a chance to use the lymphedema pump for a while.    Leotis Pain, MD  06/22/2018 10:43 AM    This note was created with Dragon medical  transcription system.  Any errors from dictation are purely unintentional

## 2018-06-22 NOTE — Assessment & Plan Note (Signed)
Improved but not resolved after laser ablation bilaterally.  Some pain and swelling persists and there is likely a component of lymphedema from chronic scarring of the lymphatic channels.  I think a lymphedema pump would be an excellent adjuvant option at this point. 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  He has had improvement with treatment of his venous disease but not resolution.  I will plan to see him back in 3 to 4 months after he has had a chance to use the lymphedema pump for a while.

## 2018-08-08 NOTE — Progress Notes (Signed)
[facility]  [phone]) [phone] Fax:([phone]) [phone]  ID: Tiger Spieker OB: 1946-11-04  MR#: [id number]  OLI#:[id number]  Patient Care Team: Derinda Late, MD as PCP - General (Family Medicine)  CHIEF COMPLAINT: Anemia in chronic kidney disease, mild iron deficiency.  INTERVAL HISTORY: Patient returns to clinic today for repeat laboratory work and further evaluation.  He continues to complain of persistent weakness and fatigue as well as dyspnea on exertion.  He continues to have bilateral lower extremity pain secondary to vascular insufficiency.  He has no neurologic complaints.  He denies any chest pain or cough.  He denies any abdominal pain.  He has no nausea, vomiting, constipation, or diarrhea.  He denies any melena or hematochezia.  He has no urinary complaints.  Patient offers no further specific complaints today.  REVIEW OF SYSTEMS:   Review of Systems  Constitutional: Positive for malaise/fatigue. Negative for fever and weight loss.  Respiratory: Positive for shortness of breath. Negative for cough and hemoptysis.   Cardiovascular: Negative.  Negative for chest pain and leg swelling.  Gastrointestinal: Negative.  Negative for abdominal pain and constipation.  Genitourinary: Negative.  Negative for hematuria.  Musculoskeletal: Positive for back pain.  Skin: Negative.  Negative for rash.  Neurological: Positive for weakness. Negative for sensory change and focal weakness.  Psychiatric/Behavioral: Negative.  The patient is not nervous/anxious.     As per HPI. Otherwise, a complete review of systems is negative.  PAST MEDICAL HISTORY: Past Medical History:  Diagnosis Date  . Chronic back pain   . COPD (chronic obstructive pulmonary disease) ([place])   . Depression   . Diabetes mellitus without complication ([place])   . Hyperlipidemia   . Hypertension   . Renal cell carcinoma ([place])   . Sleep apnea     PAST SURGICAL HISTORY: Past Surgical  History:  Procedure Laterality Date  . CARPAL TUNNEL RELEASE    . CHOLECYSTECTOMY    . COLONOSCOPY WITH PROPOFOL N/A 07/29/2017   Procedure: COLONOSCOPY WITH PROPOFOL;  Surgeon: Manya Silvas, MD;  Location: [facility] ENDOSCOPY;  Service: Endoscopy;  Laterality: N/A;  . colonoscopys    . ESOPHAGOGASTRODUODENOSCOPY (EGD) WITH PROPOFOL N/A 07/29/2017   Procedure: ESOPHAGOGASTRODUODENOSCOPY (EGD) WITH PROPOFOL;  Surgeon: Manya Silvas, MD;  Location: [facility] ENDOSCOPY;  Service: Endoscopy;  Laterality: N/A;  . FLEXIBLE SIGMOIDOSCOPY    . GASTRIC BYPASS    . NEPHRECTOMY    . open reduction and internal fixation, right distal radius    . right knee arthroscopy    . UPPER GI ENDOSCOPY      FAMILY HISTORY: Family History  Problem Relation Age of Onset  . Diabetes Mother   . Cancer Mother   . Heart disease Father     ADVANCED DIRECTIVES (Y/N):  N  HEALTH MAINTENANCE: Social History   Tobacco Use  . Smoking status: Former Smoker    Packs/day: 1.00    Years: 30.00    Pack years: 30.00  . Smokeless tobacco: Current User    Types: Chew  Substance Use Topics  . Alcohol use: No    Frequency: Never  . Drug use: No     Colonoscopy:  PAP:  Bone density:  Lipid panel:  No Known Allergies  Current Outpatient Medications  Medication Sig Dispense Refill  . amLODipine (NORVASC) 10 MG tablet Take 10 mg by mouth daily.    Marland Kitchen Apixaban (ELIQUIS PO) Take 5 mg by mouth daily.     . carvedilol (COREG) 6.25 MG tablet  Take 6.25 mg by mouth 2 (two) times daily with a meal.    . enalapril (VASOTEC) 20 MG tablet Take 20 mg by mouth daily.    . furosemide (LASIX) 40 MG tablet Take 40 mg by mouth 2 (two) times daily.     Marland Kitchen gabapentin (NEURONTIN) 300 MG capsule Take 300 mg by mouth at bedtime.     Marland Kitchen glipiZIDE (GLUCOTROL) 5 MG tablet Take by mouth daily before breakfast.    . hydrochlorothiazide (HYDRODIURIL) 25 MG tablet Take 25 mg by mouth daily.    . metolazone (ZAROXOLYN) 2.5 MG tablet     .  Multiple Vitamin (MULTIVITAMIN) tablet Take 1 tablet by mouth daily.    . pantoprazole (PROTONIX) 40 MG tablet Take 40 mg by mouth daily.    Marland Kitchen PARoxetine (PAXIL) 40 MG tablet Take 40 mg by mouth every morning.     . potassium chloride SA (K-DUR,KLOR-CON) 20 MEQ tablet Take 20 mEq by mouth daily.     . pramipexole (MIRAPEX) 0.5 MG tablet Take 1 mg by mouth 1 day or 1 dose.     . sodium bicarbonate 650 MG tablet Take 650 mg by mouth 4 (four) times daily.    Marland Kitchen spironolactone (ALDACTONE) 25 MG tablet Take 25 mg by mouth daily.     . sucralfate (CARAFATE) 1 g tablet Take 1 g by mouth 4 (four) times daily -  with meals and at bedtime.    . triamcinolone cream (KENALOG) 0.1 % ARMS AND LEGS TWICE A DAY FOR ONE WEEK, THEN REDUCE TO ONCE A DAY  0   No current facility-administered medications for this visit.     OBJECTIVE: Vitals:   08/12/18 1403 08/12/18 1407  BP:  102/63  Pulse:  74  Resp: 20   Temp:  97.9 F (36.6 C)     Body mass index is 36.11 kg/m.    ECOG FS:0 - Asymptomatic  General: Well-developed, well-nourished, no acute distress. Eyes: Pink conjunctiva, anicteric sclera. HEENT: Normocephalic, moist mucous membranes. Lungs: Clear to auscultation bilaterally. Heart: Regular rate and rhythm. No rubs, murmurs, or gallops. Abdomen: Soft, nontender, nondistended. No organomegaly noted, normoactive bowel sounds. Musculoskeletal: No edema, cyanosis, or clubbing. Neuro: Alert, answering all questions appropriately. Cranial nerves grossly intact. Skin: No rashes or petechiae noted. Psych: Normal affect.   LAB RESULTS:  Lab Results  Component Value Date   NA 142 01/10/2015   K 4.3 01/10/2015   CL 110 (H) 01/10/2015   CO2 25 01/10/2015   GLUCOSE 144 (H) 01/10/2015   BUN 24 (H) 01/10/2015   CREATININE 1.13 01/10/2015   CALCIUM 9.2 01/10/2015   PROT 6.9 06/22/2014   ALBUMIN 3.2 (L) 06/22/2014   AST 10 (L) 06/22/2014   ALT 18 06/22/2014   ALKPHOS 104 06/22/2014   BILITOT 0.4  06/22/2014   GFRNONAA >60 01/10/2015   GFRAA >60 01/10/2015    Lab Results  Component Value Date   WBC 6.4 08/12/2018   NEUTROABS 5.0 08/12/2018   HGB 10.9 (L) 08/12/2018   HCT 32.8 (L) 08/12/2018   MCV 88.9 08/12/2018   PLT 189 08/12/2018   Lab Results  Component Value Date   IRON 57 08/12/2018   TIBC 339 08/12/2018   IRONPCTSAT 17 (L) 08/12/2018   Lab Results  Component Value Date   FERRITIN 35 08/12/2018     STUDIES: No results found.  ASSESSMENT: Anemia in chronic kidney disease, mild iron deficiency.  PLAN:    1. Anemia in chronic kidney  disease, mild iron deficiency: Patient's hemoglobin has only mildly improved to 10.9.  His iron stores have also improved and are now nearly within normal limits.  He does not require additional IV Feraheme today.  He last received treatment on May 12, 2018.  Patient may benefit from Procrit in the future if his hemoglobin falls below 10.0 given his chronic renal insufficiency, but this is not necessary at this point.  Can also consider full anemia work-up in the future if necessary.  Return to clinic in 3 months with repeat laboratory work and further evaluation.   2.  Chronic renal insufficiency: Stage III.  Continue treatment and monitoring per nephrology. 3.  Vascular insufficiency: Continue follow-up with vascular surgery as scheduled.   Patient expressed understanding and was in agreement with this plan. He also understands that He can call clinic at any time with any questions, concerns, or complaints.    Lloyd Huger, MD   08/16/2018 8:15 AM

## 2018-08-12 ENCOUNTER — Encounter: Payer: Self-pay | Admitting: Oncology

## 2018-08-12 ENCOUNTER — Inpatient Hospital Stay: Payer: Medicare Other | Attending: Oncology

## 2018-08-12 ENCOUNTER — Inpatient Hospital Stay: Payer: Medicare Other

## 2018-08-12 ENCOUNTER — Other Ambulatory Visit: Payer: Self-pay

## 2018-08-12 ENCOUNTER — Inpatient Hospital Stay (HOSPITAL_BASED_OUTPATIENT_CLINIC_OR_DEPARTMENT_OTHER): Payer: Medicare Other | Admitting: Oncology

## 2018-08-12 VITALS — BP 102/63 | HR 74 | Temp 97.9°F | Resp 20 | Ht 73.0 in | Wt 273.7 lb

## 2018-08-12 DIAGNOSIS — I998 Other disorder of circulatory system: Secondary | ICD-10-CM | POA: Diagnosis not present

## 2018-08-12 DIAGNOSIS — D631 Anemia in chronic kidney disease: Secondary | ICD-10-CM | POA: Insufficient documentation

## 2018-08-12 DIAGNOSIS — D509 Iron deficiency anemia, unspecified: Secondary | ICD-10-CM

## 2018-08-12 DIAGNOSIS — N189 Chronic kidney disease, unspecified: Secondary | ICD-10-CM

## 2018-08-12 DIAGNOSIS — N183 Chronic kidney disease, stage 3 (moderate): Secondary | ICD-10-CM | POA: Diagnosis present

## 2018-08-12 LAB — CBC WITH DIFFERENTIAL/PLATELET
Basophils Absolute: 0 10*3/uL (ref 0–0.1)
Basophils Relative: 1 %
Eosinophils Absolute: 0.2 10*3/uL (ref 0–0.7)
Eosinophils Relative: 3 %
HCT: 32.8 % — ABNORMAL LOW (ref 40.0–52.0)
Hemoglobin: 10.9 g/dL — ABNORMAL LOW (ref 13.0–18.0)
Lymphocytes Relative: 13 %
Lymphs Abs: 0.8 10*3/uL — ABNORMAL LOW (ref 1.0–3.6)
MCH: 29.5 pg (ref 26.0–34.0)
MCHC: 33.1 g/dL (ref 32.0–36.0)
MCV: 88.9 fL (ref 80.0–100.0)
Monocytes Absolute: 0.4 10*3/uL (ref 0.2–1.0)
Monocytes Relative: 6 %
Neutro Abs: 5 10*3/uL (ref 1.4–6.5)
Neutrophils Relative %: 77 %
Platelets: 189 10*3/uL (ref 150–440)
RBC: 3.69 MIL/uL — ABNORMAL LOW (ref 4.40–5.90)
RDW: 16.8 % — ABNORMAL HIGH (ref 11.5–14.5)
WBC: 6.4 10*3/uL (ref 3.8–10.6)

## 2018-08-12 LAB — IRON AND TIBC
Iron: 57 ug/dL (ref 45–182)
Saturation Ratios: 17 % — ABNORMAL LOW (ref 17.9–39.5)
TIBC: 339 ug/dL (ref 250–450)
UIBC: 282 ug/dL

## 2018-08-12 LAB — FERRITIN: Ferritin: 35 ng/mL (ref 24–336)

## 2018-08-12 NOTE — Progress Notes (Signed)
No treatment today per Dr. Grayland Ormond

## 2018-08-12 NOTE — Progress Notes (Signed)
Patient here for follow up. He states he has very little energy, short of breath.

## 2018-08-30 ENCOUNTER — Telehealth (INDEPENDENT_AMBULATORY_CARE_PROVIDER_SITE_OTHER): Payer: Self-pay

## 2018-08-30 NOTE — Telephone Encounter (Signed)
Patient will be coming in the office this week to seen by either Maudie Mercury or Arna Medici

## 2018-09-01 ENCOUNTER — Encounter (INDEPENDENT_AMBULATORY_CARE_PROVIDER_SITE_OTHER): Payer: Self-pay | Admitting: Nurse Practitioner

## 2018-09-01 ENCOUNTER — Ambulatory Visit (INDEPENDENT_AMBULATORY_CARE_PROVIDER_SITE_OTHER): Payer: Medicare Other | Admitting: Nurse Practitioner

## 2018-09-01 VITALS — BP 136/71 | HR 76 | Resp 16 | Ht 73.0 in | Wt 278.0 lb

## 2018-09-01 DIAGNOSIS — I70209 Unspecified atherosclerosis of native arteries of extremities, unspecified extremity: Secondary | ICD-10-CM

## 2018-09-01 DIAGNOSIS — E118 Type 2 diabetes mellitus with unspecified complications: Secondary | ICD-10-CM

## 2018-09-01 DIAGNOSIS — I890 Lymphedema, not elsewhere classified: Secondary | ICD-10-CM

## 2018-09-01 DIAGNOSIS — I10 Essential (primary) hypertension: Secondary | ICD-10-CM | POA: Diagnosis not present

## 2018-09-01 NOTE — Patient Instructions (Signed)

## 2018-09-01 NOTE — Progress Notes (Signed)
Subjective:    Patient ID: Pierson Vantol, male    DOB: 16-Jun-1946, 72 y.o.   MRN: [id number] Chief Complaint  Patient presents with  . Follow-up    bilateral le swelling and weeping    HPI  Jonahtan Manseau is a 72 y.o. male presents with lower extremity edema and small open wounds that are weeping.  The patient states that following his bilateral endovenous laser ablation, the heaviness and the swelling in his bilateral lower extremities decreased.  However, he notes that shortly following thereafter the swelling became worse and he is having weakness in the legs, to where it is lifestyle limiting.  He states that in order to accomplish his grocery shopping he has to utilize a grocery cart for stability.  He states that during the summer he has had a decreased quality of life just because things are much more difficult due to the heaviness of his legs and weakness that he feels accompanies it.  He endorses wearing his compression socks, however he is not consistent with their use.  He is consistently utilizing his lymphedema pump as well as using elevation when possible.  He denies any fever, nausea, vomiting, diarrhea.  He denies any chest pain.  He denies any amaurosis fugax or TIA-like symptoms.  He denies any recent falls.  Review of Systems: Negative Unless Checked Constitutional: [] Weight loss  [] Fever  [] Chills Cardiac: [] Chest pain   [] Chest pressure   [] Palpitations   [] Shortness of breath when laying flat   [x] Shortness of breath with exertion. Vascular:  [x] Pain in legs with walking   [x] Pain in legs with standing  [] History of DVT   [] Phlebitis   [x] Swelling in legs   [] Varicose veins   [] Non-healing ulcers Pulmonary:   [] Uses home oxygen   [] Productive cough   [] Hemoptysis   [] Wheeze  [x] COPD   [] Asthma Neurologic:  [] Dizziness   [] Seizures   [] History of stroke   [] History of TIA  [] Aphasia   [] Vissual changes   [] Weakness or numbness in arm   [x] Weakness or  numbness in leg Musculoskeletal:   [] Joint swelling   [] Joint pain   [] Low back pain Hematologic:  [] Easy bruising  [] Easy bleeding   [] Hypercoagulable state   [] Anemic Gastrointestinal:  [] Diarrhea   [] Vomiting  [] Gastroesophageal reflux/heartburn   [] Difficulty swallowing. Genitourinary:  [] Chronic kidney disease   [] Difficult urination  [] Frequent urination   [] Blood in urine Skin:  [] Rashes   [] Ulcers  Psychological:  [] History of anxiety   []  History of major depression.     Objective:   Physical Exam  BP 136/71 (BP Location: Right Arm)   Pulse 76   Resp 16   Ht 6\' 1"  (1.854 m)   Wt 278 lb (126.1 kg)   BMI 36.68 kg/m   Past Medical History:  Diagnosis Date  . Chronic back pain   . COPD (chronic obstructive pulmonary disease) ([place])   . Depression   . Diabetes mellitus without complication ([place])   . Hyperlipidemia   . Hypertension   . Renal cell carcinoma ([place])   . Sleep apnea      Gen: WD/WN, NAD Head: [place]/AT, No temporalis wasting.  Ear/Nose/Throat: Hearing grossly intact, nares w/o erythema or drainage Eyes: PER, EOMI, sclera nonicteric.  Neck: Supple, no masses.  No JVD.  Pulmonary:  Good air movement, no use of accessory muscles.  Cardiac: RRR Vascular:  2+ pitting edema bilaterally.  Small areas of abrasion on the left shin area with no  weeping present.  Area is slightly erythematous. Vessel Right Left  PT  palpable  hard to palpate  DP  palpable  palpable  Gastrointestinal: soft, non-distended. No guarding/no peritoneal signs.  Musculoskeletal: M/S 5/5 throughout.  No deformity or atrophy.  Neurologic: Pain and light touch intact in extremities.  Symmetrical.  Speech is fluent. Motor exam as listed above. Psychiatric: Judgment intact, Mood & affect appropriate for pt's clinical situation. Dermatologic: No Venous rashes. No Ulcers Noted.  No changes consistent with cellulitis. Lymph : No Cervical lymphadenopathy, no lichenification or skin changes of chronic  lymphedema.   Social History   Socioeconomic History  . Marital status: Married    Spouse name: Not on file  . Number of children: Not on file  . Years of education: Not on file  . Highest education level: Not on file  Occupational History  . Not on file  Social Needs  . Financial resource strain: Not on file  . Food insecurity:    Worry: Not on file    Inability: Not on file  . Transportation needs:    Medical: Not on file    Non-medical: Not on file  Tobacco Use  . Smoking status: Former Smoker    Packs/day: 1.00    Years: 30.00    Pack years: 30.00  . Smokeless tobacco: Current User    Types: Chew  Substance and Sexual Activity  . Alcohol use: No    Frequency: Never  . Drug use: No  . Sexual activity: Yes  Lifestyle  . Physical activity:    Days per week: Not on file    Minutes per session: Not on file  . Stress: Not on file  Relationships  . Social connections:    Talks on phone: Not on file    Gets together: Not on file    Attends religious service: Not on file    Active member of club or organization: Not on file    Attends meetings of clubs or organizations: Not on file    Relationship status: Not on file  . Intimate partner violence:    Fear of current or ex partner: Not on file    Emotionally abused: Not on file    Physically abused: Not on file    Forced sexual activity: Not on file  Other Topics Concern  . Not on file  Social History Narrative  . Not on file    Past Surgical History:  Procedure Laterality Date  . CARPAL TUNNEL RELEASE    . CHOLECYSTECTOMY    . COLONOSCOPY WITH PROPOFOL N/A 07/29/2017   Procedure: COLONOSCOPY WITH PROPOFOL;  Surgeon: Manya Silvas, MD;  Location: [facility] ENDOSCOPY;  Service: Endoscopy;  Laterality: N/A;  . colonoscopys    . ESOPHAGOGASTRODUODENOSCOPY (EGD) WITH PROPOFOL N/A 07/29/2017   Procedure: ESOPHAGOGASTRODUODENOSCOPY (EGD) WITH PROPOFOL;  Surgeon: Manya Silvas, MD;  Location: [facility] ENDOSCOPY;   Service: Endoscopy;  Laterality: N/A;  . FLEXIBLE SIGMOIDOSCOPY    . GASTRIC BYPASS    . NEPHRECTOMY    . open reduction and internal fixation, right distal radius    . right knee arthroscopy    . UPPER GI ENDOSCOPY      Family History  Problem Relation Age of Onset  . Diabetes Mother   . Cancer Mother   . Heart disease Father     No Known Allergies     Assessment & Plan:   1. Lymphedema No surgery or intervention at this point in time.  I have had a long discussion with the patient regarding venous insufficiency and why it  causes symptoms, specifically venous ulceration . I have discussed with the patient the chronic skin changes that accompany venous insufficiency and the long term sequela such as infection and recurring  ulceration.  Patient will be placed in [employer] which will be changed weekly drainage permitting.  In addition, behavioral modification including several periods of elevation of the lower extremities during the day will be continued. Achieving a position with the ankles at heart level was stressed to the patient  The patient is instructed to begin routine exercise, especially walking on a daily basis  Patient was instructed that he can continue to use his lymphedema pump with his Unna boots.  He will follow with [place] for weekly Unna boot checks for 1 month to heal the lower extremity wounds as well as to gain control of his swelling.  Consistent use of medical grade 1 compression stockings was emphasized.  Patient was again reminded to utilize compression stockings first thing in the morning and to remove them in the evening.  2. Type 2 diabetes mellitus with complication, unspecified whether long term insulin use ([place]) Continue hypoglycemic medications as already ordered, these medications have been reviewed and there are no changes at this time.  Hgb A1C to be monitored as already arranged by primary service   3. Essential hypertension Continue  antihypertensive medications as already ordered, these medications have been reviewed and there are no changes at this time.   4. Atherosclerotic peripheral vascular disease (HCC)  Recommend:  The patient has atypical pain symptoms for pure atherosclerotic disease. However, on physical exam there is evidence of mixed venous and arterial disease, given the diminished pulses and the edema associated with venous changes of the legs.  The patient underwent bilateral ABIs on 04/07/2018.  The right lower extremity display triphasic flows throughout, the left lower extremity displayed biphasic flows near the anterior tibial artery with triphasic note of the posterior tibial artery.  However, the TBI on the right lower extremity was 0.47 and the left lower extremity was 0.53.  We will have the patient follow-up with a bilateral lower extremity arterial duplex to determine if there may be areas of stenosis that can be improved due to the nature of his symptoms.  He will follow-up with me after his study.    - VAS [place] LOWER EXTREMITY ARTERIAL DUPLEX; Future    Current Outpatient Medications on File Prior to Visit  Medication Sig Dispense Refill  . amLODipine (NORVASC) 10 MG tablet Take 10 mg by mouth daily.    Marland Kitchen Apixaban (ELIQUIS PO) Take 5 mg by mouth daily.     . carvedilol (COREG) 6.25 MG tablet Take 6.25 mg by mouth 2 (two) times daily with a meal.    . enalapril (VASOTEC) 20 MG tablet Take 20 mg by mouth daily.    . furosemide (LASIX) 40 MG tablet Take 40 mg by mouth 2 (two) times daily.     Marland Kitchen gabapentin (NEURONTIN) 300 MG capsule Take 300 mg by mouth at bedtime.     Marland Kitchen glipiZIDE (GLUCOTROL) 5 MG tablet Take by mouth daily before breakfast.    . hydrochlorothiazide (HYDRODIURIL) 25 MG tablet Take 25 mg by mouth daily.    . metolazone (ZAROXOLYN) 2.5 MG tablet     . Multiple Vitamin (MULTIVITAMIN) tablet Take 1 tablet by mouth daily.    . pantoprazole (PROTONIX) 40 MG tablet Take 40 mg by  mouth  daily.    Marland Kitchen PARoxetine (PAXIL) 40 MG tablet Take 40 mg by mouth every morning.     . potassium chloride SA (K-DUR,KLOR-CON) 20 MEQ tablet Take 20 mEq by mouth daily.     . pramipexole (MIRAPEX) 0.5 MG tablet Take 1 mg by mouth 1 day or 1 dose.     . sodium bicarbonate 650 MG tablet Take 650 mg by mouth 4 (four) times daily.    Marland Kitchen spironolactone (ALDACTONE) 25 MG tablet Take 25 mg by mouth daily.     . sucralfate (CARAFATE) 1 g tablet Take 1 g by mouth 4 (four) times daily -  with meals and at bedtime.    . triamcinolone cream (KENALOG) 0.1 % ARMS AND LEGS TWICE A DAY FOR ONE WEEK, THEN REDUCE TO ONCE A DAY  0   No current facility-administered medications on file prior to visit.     Patient Instructions  Lymphedema Lymphedema is swelling that is caused by the abnormal collection of lymph under the skin. Lymph is fluid from the tissues in your body that travels in the lymphatic system. This system is part of the immune system and includes lymph nodes and lymph vessels. The lymph vessels collect and carry the excess fluid, fats, proteins, and wastes from the tissues of the body to the bloodstream. This system also works to clean and remove bacteria and waste products from the body. Lymphedema occurs when the lymphatic system is blocked. When the lymph vessels or lymph nodes are blocked or damaged, lymph does not drain properly, causing an abnormal buildup of lymph. This leads to swelling in the arms or legs. Lymphedema cannot be cured by medicines, but various methods can be used to help reduce the swelling. What are the causes? There are two types of lymphedema. Primary lymphedema is caused by the absence or abnormality of the lymph vessel at birth. Secondary lymphedema is more common. It occurs when the lymph vessel is damaged or blocked. Common causes of lymph vessel blockage include:  Skin infection, such as cellulitis.  Infection by parasites (filariasis).  Injury.  Cancer.  Radiation  therapy.  Formation of scar tissue.  Surgery.  What are the signs or symptoms? Symptoms of this condition include:  Swelling of the arm or leg.  A heavy or tight feeling in the arm or leg.  Swelling of the feet, toes, or fingers. Shoes or rings may fit more tightly than before.  Redness of the skin over the affected area.  Limited movement of the affected limb.  Sensitivity to touch or discomfort in the affected limb.  How is this diagnosed? This condition may be diagnosed with:  A physical exam.  Medical history.  Bioimpedance spectroscopy. In this test, painless electrical currents are used to measure fluid levels in your body.  Imaging tests, such as: ? Lymphoscintigraphy. In this test, a low dose of a radioactive substance is injected to trace the flow of lymph through the lymph vessels. ? MRI. ? CT scan. ? Duplex ultrasound. This test uses sound waves to produce images of the vessels and the blood flow on a screen. ? Lymphangiography. In this test, a contrast dye is injected into the lymph vessel to help show blockages.  How is this treated? Treatment for this condition may depend on the cause. Treatment may include:  Exercise. Certain exercises can help fluid move out of the affected limb.  Massage. Gentle massage of the affected limb can help move the fluid out of  the area.  Compression. Various methods may be used to apply pressure to the affected limb in order to reduce the swelling. ? Wearing compression stockings or sleeves on the affected limb. ? Bandaging the affected limb. ? Using an external pump that is attached to a sleeve that alternates between applying pressure and releasing pressure.  Surgery. This is usually only done for severe cases. For example, surgery may be done if you have trouble moving the limb or if the swelling does not get better with other treatments.  If an underlying condition is causing the lymphedema, treatment for that  condition is needed. For example, antibiotic medicines may be used to treat an infection. Follow these instructions at home: Activities  Exercise regularly as directed by your health care provider.  Do not sit with your legs crossed.  When possible, keep the affected limb raised (elevated) above the level of your heart.  Avoid carrying things with an arm that is affected by lymphedema.  Remember that the affected area is more likely to become injured or infected.  Take these steps to help prevent infection: ? Keep the affected area clean and dry. ? Protect your skin from cuts. For example, you should use gloves while cooking or gardening. Do not walk barefoot. If you shave the affected area, use an Copy. General instructions  Take medicines only as directed by your health care provider.  Eat a healthy diet that includes a lot of fruits and vegetables.  Do not wear tight clothes, shoes, or jewelry.  Do not use heating pads over the affected area.  Avoid having blood pressure checked on the affected limb.  Keep all follow-up visits as directed by your health care provider. This is important. Contact a health care provider if:  You continue to have swelling in your limb.  You have a fever.  You have a cut that does not heal.  You have redness or pain in the affected area.  You have new swelling in your limb that comes on suddenly.  You develop purplish spots or sores (lesions) on your limb. Get help right away if:  You have a skin rash.  You have chills or sweats.  You have shortness of breath. This information is not intended to replace advice given to you by your health care provider. Make sure you discuss any questions you have with your health care provider. Document Released: 09/28/2007 Document Revised: 08/07/2016 Document Reviewed: 11/08/2014 Elsevier Interactive Patient Education  2018 [employer].   No follow-ups on file.   Kris Hartmann,  NP

## 2018-09-08 ENCOUNTER — Encounter (INDEPENDENT_AMBULATORY_CARE_PROVIDER_SITE_OTHER): Payer: Self-pay

## 2018-09-08 ENCOUNTER — Ambulatory Visit (INDEPENDENT_AMBULATORY_CARE_PROVIDER_SITE_OTHER): Payer: Medicare Other | Admitting: Nurse Practitioner

## 2018-09-08 ENCOUNTER — Ambulatory Visit (INDEPENDENT_AMBULATORY_CARE_PROVIDER_SITE_OTHER): Payer: Medicare Other

## 2018-09-08 VITALS — BP 122/66 | HR 71 | Resp 17 | Ht 71.0 in | Wt 275.0 lb

## 2018-09-08 DIAGNOSIS — I890 Lymphedema, not elsewhere classified: Secondary | ICD-10-CM | POA: Diagnosis not present

## 2018-09-08 DIAGNOSIS — L97929 Non-pressure chronic ulcer of unspecified part of left lower leg with unspecified severity: Secondary | ICD-10-CM

## 2018-09-08 DIAGNOSIS — L97919 Non-pressure chronic ulcer of unspecified part of right lower leg with unspecified severity: Secondary | ICD-10-CM

## 2018-09-08 NOTE — Progress Notes (Signed)
History of Present Illness  There is no documented history at this time  Assessments & Plan   There are no diagnoses linked to this encounter.    Additional instructions  Subjective:  Patient presents with venous ulcer of the Bilateral lower extremity.    Procedure:  3 layer unna wrap was placed Bilateral lower extremity.   Plan:   Follow up in one week.  

## 2018-09-15 ENCOUNTER — Encounter (INDEPENDENT_AMBULATORY_CARE_PROVIDER_SITE_OTHER): Payer: Self-pay

## 2018-09-15 ENCOUNTER — Ambulatory Visit (INDEPENDENT_AMBULATORY_CARE_PROVIDER_SITE_OTHER): Payer: Medicare Other | Admitting: Nurse Practitioner

## 2018-09-15 VITALS — BP 121/70 | HR 71 | Resp 16 | Ht 73.0 in | Wt 277.6 lb

## 2018-09-15 DIAGNOSIS — I890 Lymphedema, not elsewhere classified: Secondary | ICD-10-CM | POA: Diagnosis not present

## 2018-09-15 NOTE — Progress Notes (Signed)
History of Present Illness  There is no documented history at this time  Assessments & Plan   There are no diagnoses linked to this encounter.    Additional instructions  Subjective:  Patient presents with venous ulcer of the Bilateral lower extremity.    Procedure:  3 layer unna wrap was placed Bilateral lower extremity.   Plan:   Follow up in one week.  

## 2018-09-20 ENCOUNTER — Other Ambulatory Visit (INDEPENDENT_AMBULATORY_CARE_PROVIDER_SITE_OTHER): Payer: Self-pay | Admitting: Vascular Surgery

## 2018-09-20 DIAGNOSIS — I739 Peripheral vascular disease, unspecified: Secondary | ICD-10-CM

## 2018-09-22 ENCOUNTER — Encounter (INDEPENDENT_AMBULATORY_CARE_PROVIDER_SITE_OTHER): Payer: Medicare Other

## 2018-09-22 ENCOUNTER — Ambulatory Visit (INDEPENDENT_AMBULATORY_CARE_PROVIDER_SITE_OTHER): Payer: Medicare Other | Admitting: Nurse Practitioner

## 2018-09-22 ENCOUNTER — Other Ambulatory Visit (INDEPENDENT_AMBULATORY_CARE_PROVIDER_SITE_OTHER): Payer: Medicare Other

## 2018-09-22 ENCOUNTER — Ambulatory Visit (INDEPENDENT_AMBULATORY_CARE_PROVIDER_SITE_OTHER): Payer: Medicare Other

## 2018-09-22 ENCOUNTER — Encounter (INDEPENDENT_AMBULATORY_CARE_PROVIDER_SITE_OTHER): Payer: Self-pay | Admitting: Nurse Practitioner

## 2018-09-22 ENCOUNTER — Other Ambulatory Visit (INDEPENDENT_AMBULATORY_CARE_PROVIDER_SITE_OTHER): Payer: Self-pay | Admitting: Nurse Practitioner

## 2018-09-22 VITALS — BP 142/72 | HR 63 | Resp 16 | Ht 73.0 in | Wt 276.0 lb

## 2018-09-22 DIAGNOSIS — I739 Peripheral vascular disease, unspecified: Secondary | ICD-10-CM

## 2018-09-22 DIAGNOSIS — I70209 Unspecified atherosclerosis of native arteries of extremities, unspecified extremity: Secondary | ICD-10-CM | POA: Diagnosis not present

## 2018-09-22 DIAGNOSIS — M79605 Pain in left leg: Secondary | ICD-10-CM

## 2018-09-22 DIAGNOSIS — E118 Type 2 diabetes mellitus with unspecified complications: Secondary | ICD-10-CM | POA: Diagnosis not present

## 2018-09-22 DIAGNOSIS — M79604 Pain in right leg: Secondary | ICD-10-CM

## 2018-09-22 DIAGNOSIS — I890 Lymphedema, not elsewhere classified: Secondary | ICD-10-CM

## 2018-09-23 ENCOUNTER — Encounter (INDEPENDENT_AMBULATORY_CARE_PROVIDER_SITE_OTHER): Payer: Self-pay | Admitting: Nurse Practitioner

## 2018-09-23 NOTE — Progress Notes (Signed)
Subjective:    Patient ID: [name], male    DOB: 1946-07-04, 72 y.o.   MRN: [id number] Chief Complaint  Patient presents with  . Follow-up    unna check,abi results    HPI  [name] is a 72 y.o. male that presents for follow-up with bilateral weakening and heaviness in his lower extremities.  He states that this is lifestyle limiting, to the point where he is unable to do everything things like grocery shopping or prolonged work.  He states that during the summer this is with this pain has decreased greatly and may suggest more difficult due to the heaviness of his legs and the weakness that he feels accompanies it.  The patient has been in wraps for approximately 3 weeks and despite utilizing Unna wraps and gaining control of his edema, the weakness and heaviness have persisted.  Today he has underwent bilateral ABIs as well as an aortoiliac duplex.  The right ABI was 1.15 with a TBI of 0.56, the left ABI is 1.17 with a TBI of 0.67.  The that aortoiliac study revealed triphasic flows through the common iliacs bilaterally as well as the external iliacs.  There is also triphasic flow noted in the bilateral common femoral and popliteal arteries.  It is noted that there was some limitation due to air/bowel gas and obesity.  Review of Systems   Review of Systems: Negative Unless Checked Constitutional: [] Weight loss  [] Fever  [] Chills Cardiac: [] Chest pain   ? Atrial Fibrillation  [] Palpitations   [] Shortness of breath when laying flat   [] Shortness of breath with exertion. Vascular:  [x] Pain in legs with walking   [x] Pain in legs with standing  [] History of DVT   [] Phlebitis   [x] Swelling in legs   [] Varicose veins   [] Non-healing ulcers Pulmonary:   [] Uses home oxygen   [] Productive cough   [] Hemoptysis   [] Wheeze  [] COPD   [] Asthma Neurologic:  [] Dizziness   [] Seizures   [] History of stroke   [] History of TIA  [] Aphasia   [] Vissual changes   [] Weakness or numbness  in arm   [] Weakness or numbness in leg Musculoskeletal:   [] Joint swelling   [] Joint pain   [] Low back pain  ? History of Knee Replacement Hematologic:  [] Easy bruising  [] Easy bleeding   [] Hypercoagulable state   [] Anemic Gastrointestinal:  [] Diarrhea   [] Vomiting  [] Gastroesophageal reflux/heartburn   [] Difficulty swallowing. Genitourinary:  [] Chronic kidney disease   [] Difficult urination  [] Anuric   [] Blood in urine Skin:  [x] Rashes   [] Ulcers  Psychological:  [] History of anxiety   []  History of major depression  ? Memory Difficulties     Objective:   Physical Exam  BP (!) 142/72 (BP Location: Right Arm)   Pulse 63   Resp 16   Ht 6\' 1"  (1.854 m)   Wt 276 lb (125.2 kg)   BMI 36.41 kg/m   Past Medical History:  Diagnosis Date  . Chronic back pain   . COPD (chronic obstructive pulmonary disease) ([place])   . Depression   . Diabetes mellitus without complication ([place])   . Hyperlipidemia   . Hypertension   . Renal cell carcinoma ([place])   . Sleep apnea      Gen: WD/WN, NAD Head: [place]/AT, No temporalis wasting.  Ear/Nose/Throat: Hearing grossly intact, nares w/o erythema or drainage Eyes: PER, EOMI, sclera nonicteric.  Neck: Supple, no masses.  No JVD.  Pulmonary:  Good air movement, no use of accessory  muscles.  Cardiac: RRR Vascular:  Swelling markedly improved bilaterally, 1+ soft edema. Vessel Right Left  Dorsalis Pedis Palpable Palpable  Posterior Tibial Palpable Palpable   Gastrointestinal: soft, non-distended. No guarding/no peritoneal signs.  Musculoskeletal: M/S 5/5 throughout.  No deformity or atrophy.  Neurologic: Pain and light touch intact in extremities.  Symmetrical.  Speech is fluent. Motor exam as listed above. Psychiatric: Judgment intact, Mood & affect appropriate for pt's clinical situation. Dermatologic: No Venous rashes. No Ulcers Noted.  No changes consistent with cellulitis. Lymph : No Cervical lymphadenopathy, no lichenification or skin changes of  chronic lymphedema.   Social History   Socioeconomic History  . Marital status: Married    Spouse name: Not on file  . Number of children: Not on file  . Years of education: Not on file  . Highest education level: Not on file  Occupational History  . Not on file  Social Needs  . Financial resource strain: Not on file  . Food insecurity:    Worry: Not on file    Inability: Not on file  . Transportation needs:    Medical: Not on file    Non-medical: Not on file  Tobacco Use  . Smoking status: Former Smoker    Packs/day: 1.00    Years: 30.00    Pack years: 30.00  . Smokeless tobacco: Current User    Types: Chew  Substance and Sexual Activity  . Alcohol use: No    Frequency: Never  . Drug use: No  . Sexual activity: Yes  Lifestyle  . Physical activity:    Days per week: Not on file    Minutes per session: Not on file  . Stress: Not on file  Relationships  . Social connections:    Talks on phone: Not on file    Gets together: Not on file    Attends religious service: Not on file    Active member of club or organization: Not on file    Attends meetings of clubs or organizations: Not on file    Relationship status: Not on file  . Intimate partner violence:    Fear of current or ex partner: Not on file    Emotionally abused: Not on file    Physically abused: Not on file    Forced sexual activity: Not on file  Other Topics Concern  . Not on file  Social History Narrative  . Not on file    Past Surgical History:  Procedure Laterality Date  . CARPAL TUNNEL RELEASE    . CHOLECYSTECTOMY    . COLONOSCOPY WITH PROPOFOL N/A 07/29/2017   Procedure: COLONOSCOPY WITH PROPOFOL;  Surgeon: Manya Silvas, MD;  Location: [facility] ENDOSCOPY;  Service: Endoscopy;  Laterality: N/A;  . colonoscopys    . ESOPHAGOGASTRODUODENOSCOPY (EGD) WITH PROPOFOL N/A 07/29/2017   Procedure: ESOPHAGOGASTRODUODENOSCOPY (EGD) WITH PROPOFOL;  Surgeon: Manya Silvas, MD;  Location: [facility]  ENDOSCOPY;  Service: Endoscopy;  Laterality: N/A;  . FLEXIBLE SIGMOIDOSCOPY    . GASTRIC BYPASS    . NEPHRECTOMY    . open reduction and internal fixation, right distal radius    . right knee arthroscopy    . UPPER GI ENDOSCOPY      Family History  Problem Relation Age of Onset  . Diabetes Mother   . Cancer Mother   . Heart disease Father     No Known Allergies     Assessment & Plan:   1. Lymphedema The patient currently has 1 more week  left with a wrap therapy.  We will continue utilizing Unna wrap therapy, and he will also continue to utilize a lymphedema pump.  We will follow-up at the end of Unna wrap therapy to determine if it needs to be extended or if the patient will be able to resume using compression socks.  2. Atherosclerotic peripheral vascular disease ([place]) Today he has underwent bilateral ABIs as well as an aortoiliac duplex.  The right ABI was 1.15 with a TBI of 0.56, the left ABI is 1.17 with a TBI of 0.67.  The that aortoiliac study revealed triphasic flows through the common iliacs bilaterally as well as the external iliacs.  There is also triphasic flow noted in the bilateral common femoral and popliteal arteries.  It is noted that there was some limitation due to air/bowel gas and obesity.  The patient studies are not consistent with what is seen with significant peripheral vascular disease.  The patient does have a history of degenerative disc disease as well as spinal stenosis, we will have patient examined lower back issues prior to trying more invasive methods of diagnosing leg pain.  3. Type 2 diabetes mellitus with complication (HCC) Continue hypoglycemic medications as already ordered, these medications have been reviewed and there are no changes at this time.  Hgb A1C to be monitored as already arranged by primary service   4. Lower extremity pain, bilateral Per the patient's request we have referred him to an orthopedic specialist.  This will allow  further scrutiny of the lower back area as well as spinal stenosis, to determine if this is possibly worsened which could also be a cause of the patient's continued weakness in his lower extremities.  If orthopedics findings are unrevealing, we may discuss the possibility of a diagnostic angiogram with Dr. Megan Salon.  - AMB referral to orthopedics   Current Outpatient Medications on File Prior to Visit  Medication Sig Dispense Refill  . amLODipine (NORVASC) 10 MG tablet Take 10 mg by mouth daily.    Marland Kitchen Apixaban (ELIQUIS PO) Take 5 mg by mouth daily.     . carvedilol (COREG) 6.25 MG tablet Take 6.25 mg by mouth 2 (two) times daily with a meal.    . enalapril (VASOTEC) 20 MG tablet Take 20 mg by mouth daily.    . furosemide (LASIX) 40 MG tablet Take 40 mg by mouth 2 (two) times daily.     Marland Kitchen gabapentin (NEURONTIN) 300 MG capsule Take 300 mg by mouth at bedtime.     Marland Kitchen glipiZIDE (GLUCOTROL) 5 MG tablet Take by mouth daily before breakfast.    . hydrochlorothiazide (HYDRODIURIL) 25 MG tablet Take 25 mg by mouth daily.    . metolazone (ZAROXOLYN) 2.5 MG tablet     . Multiple Vitamin (MULTIVITAMIN) tablet Take 1 tablet by mouth daily.    . pantoprazole (PROTONIX) 40 MG tablet Take 40 mg by mouth daily.    Marland Kitchen PARoxetine (PAXIL) 40 MG tablet Take 40 mg by mouth every morning.     . potassium chloride SA (K-DUR,KLOR-CON) 20 MEQ tablet Take 20 mEq by mouth daily.     . pramipexole (MIRAPEX) 0.5 MG tablet Take 1 mg by mouth 1 day or 1 dose.     . sodium bicarbonate 650 MG tablet Take 650 mg by mouth 4 (four) times daily.    Marland Kitchen spironolactone (ALDACTONE) 25 MG tablet Take 25 mg by mouth daily.     . sucralfate (CARAFATE) 1 g tablet Take 1 g by  mouth 4 (four) times daily -  with meals and at bedtime.    . triamcinolone cream (KENALOG) 0.1 % ARMS AND LEGS TWICE A DAY FOR ONE WEEK, THEN REDUCE TO ONCE A DAY  0   No current facility-administered medications on file prior to visit.     There are no Patient  Instructions on file for this visit. Return in about 1 week (around 09/29/2018).   Kris Hartmann, NP

## 2018-09-24 ENCOUNTER — Ambulatory Visit (INDEPENDENT_AMBULATORY_CARE_PROVIDER_SITE_OTHER): Payer: Medicare Other | Admitting: Vascular Surgery

## 2018-09-29 ENCOUNTER — Ambulatory Visit (INDEPENDENT_AMBULATORY_CARE_PROVIDER_SITE_OTHER): Payer: Medicare Other | Admitting: Nurse Practitioner

## 2018-09-29 ENCOUNTER — Ambulatory Visit (INDEPENDENT_AMBULATORY_CARE_PROVIDER_SITE_OTHER): Payer: Medicare Other

## 2018-09-29 ENCOUNTER — Other Ambulatory Visit (INDEPENDENT_AMBULATORY_CARE_PROVIDER_SITE_OTHER): Payer: Self-pay | Admitting: Nurse Practitioner

## 2018-09-29 ENCOUNTER — Encounter (INDEPENDENT_AMBULATORY_CARE_PROVIDER_SITE_OTHER): Payer: Self-pay | Admitting: Nurse Practitioner

## 2018-09-29 VITALS — BP 129/70 | HR 74 | Resp 16 | Ht 73.0 in | Wt 278.8 lb

## 2018-09-29 DIAGNOSIS — I10 Essential (primary) hypertension: Secondary | ICD-10-CM

## 2018-09-29 DIAGNOSIS — M79605 Pain in left leg: Secondary | ICD-10-CM

## 2018-09-29 DIAGNOSIS — I890 Lymphedema, not elsewhere classified: Secondary | ICD-10-CM

## 2018-09-29 DIAGNOSIS — R29898 Other symptoms and signs involving the musculoskeletal system: Secondary | ICD-10-CM

## 2018-09-29 DIAGNOSIS — E118 Type 2 diabetes mellitus with unspecified complications: Secondary | ICD-10-CM | POA: Diagnosis not present

## 2018-09-29 DIAGNOSIS — M79604 Pain in right leg: Secondary | ICD-10-CM

## 2018-09-29 DIAGNOSIS — F17220 Nicotine dependence, chewing tobacco, uncomplicated: Secondary | ICD-10-CM

## 2018-10-01 ENCOUNTER — Encounter (INDEPENDENT_AMBULATORY_CARE_PROVIDER_SITE_OTHER): Payer: Self-pay | Admitting: Nurse Practitioner

## 2018-10-01 NOTE — Progress Notes (Signed)
Subjective:    Patient ID: [name], male    DOB: [birth year], 72 y.o.   MRN: [id number] Chief Complaint  Patient presents with  . Follow-up    unna wrap check    HPI  [name] is a 72 y.o. male presents today for follow-up lower extremity leg swelling ulceration, as well as bilateral lower extremity weakness.  [name] was in wraps for 4 weeks.  Today his legs appear much less swollen, with no ulcerations.  He continues to have lower extremity weakness with continued exercise.  Previously we have done ABIs and lower extremity arterial duplex is which did not suggest an arterial component to his weakness.  Today we repeated his ABIs however we also added an exercise component to his ABIs.  The right ABI was 0.85 following 1 minute of toe lifts, the left ABI was 1.03.  Previous ABIs at rest were 1.07 on the right and 1.17 on the left.  Patient denies any nausea, chills, fever, diarrhea.  Patient denies any chest pain shortness of breath.  The patient does endorse having a history of back pain as well as spinal stenosis.  Past Medical History:  Diagnosis Date  . Chronic back pain   . COPD (chronic obstructive pulmonary disease) ([place])   . Depression   . Diabetes mellitus without complication ([place])   . Hyperlipidemia   . Hypertension   . Renal cell carcinoma ([place])   . Sleep apnea     Social History   Socioeconomic History  . Marital status: Married    Spouse name: Not on file  . Number of children: Not on file  . Years of education: Not on file  . Highest education level: Not on file  Occupational History  . Not on file  Social Needs  . Financial resource strain: Not on file  . Food insecurity:    Worry: Not on file    Inability: Not on file  . Transportation needs:    Medical: Not on file    Non-medical: Not on file  Tobacco Use  . Smoking status: Former Smoker    Packs/day: 1.00    Years: 30.00    Pack years: 30.00  . Smokeless  tobacco: Current User    Types: Chew  Substance and Sexual Activity  . Alcohol use: No    Frequency: Never  . Drug use: No  . Sexual activity: Yes  Lifestyle  . Physical activity:    Days per week: Not on file    Minutes per session: Not on file  . Stress: Not on file  Relationships  . Social connections:    Talks on phone: Not on file    Gets together: Not on file    Attends religious service: Not on file    Active member of club or organization: Not on file    Attends meetings of clubs or organizations: Not on file    Relationship status: Not on file  . Intimate partner violence:    Fear of current or ex partner: Not on file    Emotionally abused: Not on file    Physically abused: Not on file    Forced sexual activity: Not on file  Other Topics Concern  . Not on file  Social History Narrative  . Not on file    Past Surgical History:  Procedure Laterality Date  . CARPAL TUNNEL RELEASE    . CHOLECYSTECTOMY    . COLONOSCOPY WITH PROPOFOL N/A 07/29/2017  Procedure: COLONOSCOPY WITH PROPOFOL;  Surgeon: Manya Silvas, MD;  Location: [facility] ENDOSCOPY;  Service: Endoscopy;  Laterality: N/A;  . colonoscopys    . ESOPHAGOGASTRODUODENOSCOPY (EGD) WITH PROPOFOL N/A 07/29/2017   Procedure: ESOPHAGOGASTRODUODENOSCOPY (EGD) WITH PROPOFOL;  Surgeon: Manya Silvas, MD;  Location: [facility] ENDOSCOPY;  Service: Endoscopy;  Laterality: N/A;  . FLEXIBLE SIGMOIDOSCOPY    . GASTRIC BYPASS    . NEPHRECTOMY    . open reduction and internal fixation, right distal radius    . right knee arthroscopy    . UPPER GI ENDOSCOPY      Family History  Problem Relation Age of Onset  . Diabetes Mother   . Cancer Mother   . Heart disease Father     No Known Allergies   Review of Systems   Review of Systems: Negative Unless Checked Constitutional: [] Weight loss  [] Fever  [] Chills Cardiac: [] Chest pain   []  Atrial Fibrillation  [] Palpitations   [] Shortness of breath when laying flat    [] Shortness of breath with exertion. Vascular:  [x] Pain in legs with walking   [x] Pain in legs with standing  [] History of DVT   [] Phlebitis   [x] Swelling in legs   [] Varicose veins   [] Non-healing ulcers Pulmonary:   [] Uses home oxygen   [] Productive cough   [] Hemoptysis   [] Wheeze  [] COPD   [] Asthma Neurologic:  [] Dizziness   [] Seizures   [] History of stroke   [] History of TIA  [] Aphasia   [] Vissual changes   [] Weakness or numbness in arm   [] Weakness or numbness in leg Musculoskeletal:   [] Joint swelling   [] Joint pain   [] Low back pain  []  History of Knee Replacement Hematologic:  [] Easy bruising  [] Easy bleeding   [] Hypercoagulable state   [x] Anemic Gastrointestinal:  [] Diarrhea   [] Vomiting  [] Gastroesophageal reflux/heartburn   [] Difficulty swallowing. Genitourinary:  [] Chronic kidney disease   [] Difficult urination  [] Anuric   [] Blood in urine Skin:  [] Rashes   [] Ulcers  Psychological:  [] History of anxiety   []  History of major depression  []  Memory Difficulties     Objective:   Physical Exam  BP 129/70 (BP Location: Right Arm)   Pulse 74   Resp 16   Ht 6\' 1"  (1.854 m)   Wt 278 lb 12.8 oz (126.5 kg)   BMI 36.78 kg/m   Gen: WD/WN, NAD Head: [place]/AT, No temporalis wasting.  Ear/Nose/Throat: Hearing grossly intact, nares w/o erythema or drainage Eyes: PER, EOMI, sclera nonicteric.  Neck: Supple, no masses.  No JVD.  Pulmonary:  Good air movement, no use of accessory muscles.  Cardiac: RRR Vascular: swelling much improved.  1+ soft edema, no ulcers Vessel Right Left  Dorsalis Pedis Palpable Palpable  Posterior Tibial Palpable Palpable   Gastrointestinal: soft, non-distended. No guarding/no peritoneal signs.  Musculoskeletal: M/S 5/5 throughout.  No deformity or atrophy.  Neurologic: Pain and light touch intact in extremities.  Symmetrical.  Speech is fluent. Motor exam as listed above. Psychiatric: Judgment intact, Mood & affect appropriate for pt's clinical  situation. Dermatologic: No Venous rashes. No Ulcers Noted.  No changes consistent with cellulitis. Lymph : No Cervical lymphadenopathy, no lichenification or skin changes of chronic lymphedema.      Assessment & Plan:    1. Leg weakness, bilateral While the ABIs are decreased with exercise, he did not decrease to a level that would indicate significant peripheral vascular disease.  Also, per Mr. Crysler he has one kidney which is functioning about 40% currently therefore an  angiogram would possibly do more risk than provide benefit at this time.  Given Mr. Straka history of spinal stenosis, I think that it would be prudent for him to follow with orthopedics to determine whether this could be playing a cause in his significant lower extremity weakness with exercise.  I have placed a referral at his request. - AMB referral to orthopedics  2. Lymphedema Lymphedema much more controlled today.  The patient will stop the use of Unna wraps today.  The patient will continue to utilize medical grade 1 compression stockings 20 to 30 mmHg on a daily basis.  It has been emphasized that he should place his socks on first thing in the morning when he awakes and then removed in the evening before bed.  Elevation was also stressed.  Use of his lymphedema pump hourly during the evening was also advised.  3. Essential hypertension Continue antihypertensive medications as already ordered, these medications have been reviewed and there are no changes at this time.   4. Type 2 diabetes mellitus with complication ([place]) Continue hypoglycemic medications as already ordered, these medications have been reviewed and there are no changes at this time.  Hgb A1C to be monitored as already arranged by primary service   Current Outpatient Medications on File Prior to Visit  Medication Sig Dispense Refill  . amLODipine (NORVASC) 10 MG tablet Take 10 mg by mouth daily.    Marland Kitchen Apixaban (ELIQUIS PO) Take 5 mg by  mouth daily.     . carvedilol (COREG) 6.25 MG tablet Take 6.25 mg by mouth 2 (two) times daily with a meal.    . enalapril (VASOTEC) 20 MG tablet Take 20 mg by mouth daily.    . furosemide (LASIX) 40 MG tablet Take 40 mg by mouth 2 (two) times daily.     Marland Kitchen gabapentin (NEURONTIN) 300 MG capsule Take 300 mg by mouth at bedtime.     Marland Kitchen glipiZIDE (GLUCOTROL) 5 MG tablet Take by mouth daily before breakfast.    . hydrochlorothiazide (HYDRODIURIL) 25 MG tablet Take 25 mg by mouth daily.    . metolazone (ZAROXOLYN) 2.5 MG tablet     . Multiple Vitamin (MULTIVITAMIN) tablet Take 1 tablet by mouth daily.    . pantoprazole (PROTONIX) 40 MG tablet Take 40 mg by mouth daily.    Marland Kitchen PARoxetine (PAXIL) 40 MG tablet Take 40 mg by mouth every morning.     . potassium chloride SA (K-DUR,KLOR-CON) 20 MEQ tablet Take 20 mEq by mouth daily.     . pramipexole (MIRAPEX) 0.5 MG tablet Take 1 mg by mouth 1 day or 1 dose.     . sodium bicarbonate 650 MG tablet Take 650 mg by mouth 4 (four) times daily.    Marland Kitchen spironolactone (ALDACTONE) 25 MG tablet Take 25 mg by mouth daily.     . sucralfate (CARAFATE) 1 g tablet Take 1 g by mouth 4 (four) times daily -  with meals and at bedtime.    . triamcinolone cream (KENALOG) 0.1 % ARMS AND LEGS TWICE A DAY FOR ONE WEEK, THEN REDUCE TO ONCE A DAY  0   No current facility-administered medications on file prior to visit.     There are no Patient Instructions on file for this visit. Return in about 6 months (around 03/31/2019).   Kris Hartmann, NP

## 2018-10-05 ENCOUNTER — Ambulatory Visit (INDEPENDENT_AMBULATORY_CARE_PROVIDER_SITE_OTHER): Payer: Medicare Other | Admitting: Orthopedic Surgery

## 2018-10-22 ENCOUNTER — Other Ambulatory Visit: Payer: Self-pay | Admitting: Orthopedic Surgery

## 2018-10-22 DIAGNOSIS — M545 Low back pain, unspecified: Secondary | ICD-10-CM

## 2018-10-22 DIAGNOSIS — M4807 Spinal stenosis, lumbosacral region: Secondary | ICD-10-CM

## 2018-10-22 DIAGNOSIS — G8929 Other chronic pain: Secondary | ICD-10-CM

## 2018-11-14 NOTE — Progress Notes (Signed)
[place]  [phone] [phone]  ID: [name] OB: 1946-03-01  MR#: [id number]  DPO#:[id number]  Patient Care Team: Derinda Late, MD as PCP - General (Family Medicine)  CHIEF COMPLAINT: Anemia in chronic kidney disease, mild iron deficiency.  INTERVAL HISTORY: Patient returns to clinic today for repeat laboratory work, further evaluation, and consideration of IV Feraheme.  He has significant back pain radiating down his leg and is being evaluated orthopedics for this.  He otherwise feels well.  He does not complain of weakness or fatigue today. He has no neurologic complaints.  He denies any chest pain, shortness of breath, or cough.  He denies any abdominal pain.  He has no nausea, vomiting, constipation, or diarrhea.  He denies any melena or hematochezia.  He has no urinary complaints.  Patient offers no further specific complaints today.  REVIEW OF SYSTEMS:   Review of Systems  Constitutional: Negative for fever, malaise/fatigue and weight loss.  Respiratory: Negative for cough, hemoptysis and shortness of breath.   Cardiovascular: Negative.  Negative for chest pain and leg swelling.  Gastrointestinal: Negative.  Negative for abdominal pain and constipation.  Genitourinary: Negative.  Negative for hematuria.  Musculoskeletal: Positive for back pain.  Skin: Negative.  Negative for rash.  Neurological: Negative for sensory change, focal weakness and weakness.  Psychiatric/Behavioral: Negative.  The patient is not nervous/anxious.     As per HPI. Otherwise, a complete review of systems is negative.  PAST MEDICAL HISTORY: Past Medical History:  Diagnosis Date  . Chronic back pain   . COPD (chronic obstructive pulmonary disease) ([place])   . Depression   . Diabetes mellitus without complication ([place])   . Hyperlipidemia   . Hypertension   . Renal cell carcinoma ([place])   . Sleep apnea     PAST SURGICAL HISTORY: Past Surgical  History:  Procedure Laterality Date  . CARPAL TUNNEL RELEASE    . CHOLECYSTECTOMY    . COLONOSCOPY WITH PROPOFOL N/A 07/29/2017   Procedure: COLONOSCOPY WITH PROPOFOL;  Surgeon: Manya Silvas, MD;  Location: [facility] ENDOSCOPY;  Service: Endoscopy;  Laterality: N/A;  . colonoscopys    . ESOPHAGOGASTRODUODENOSCOPY (EGD) WITH PROPOFOL N/A 07/29/2017   Procedure: ESOPHAGOGASTRODUODENOSCOPY (EGD) WITH PROPOFOL;  Surgeon: Manya Silvas, MD;  Location: [facility] ENDOSCOPY;  Service: Endoscopy;  Laterality: N/A;  . FLEXIBLE SIGMOIDOSCOPY    . GASTRIC BYPASS    . NEPHRECTOMY    . open reduction and internal fixation, right distal radius    . right knee arthroscopy    . UPPER GI ENDOSCOPY      FAMILY HISTORY: Family History  Problem Relation Age of Onset  . Diabetes Mother   . Cancer Mother   . Heart disease Father     ADVANCED DIRECTIVES (Y/N):  N  HEALTH MAINTENANCE: Social History   Tobacco Use  . Smoking status: Former Smoker    Packs/day: 1.00    Years: 30.00    Pack years: 30.00  . Smokeless tobacco: Current User    Types: Chew  Substance Use Topics  . Alcohol use: No    Frequency: Never  . Drug use: No     Colonoscopy:  PAP:  Bone density:  Lipid panel:  No Known Allergies  Current Outpatient Medications  Medication Sig Dispense Refill  . Apixaban (ELIQUIS PO) Take 5 mg by mouth daily.     . carvedilol (COREG) 6.25 MG tablet Take 6.25 mg by mouth 2 (two) times daily with a meal.    .  enalapril (VASOTEC) 20 MG tablet Take 20 mg by mouth daily.    . furosemide (LASIX) 40 MG tablet Take 40 mg by mouth 2 (two) times daily.     Marland Kitchen gabapentin (NEURONTIN) 300 MG capsule Take 300 mg by mouth at bedtime.     Marland Kitchen glipiZIDE (GLUCOTROL) 5 MG tablet Take by mouth daily before breakfast.    . metolazone (ZAROXOLYN) 2.5 MG tablet     . Multiple Vitamin (MULTIVITAMIN) tablet Take 1 tablet by mouth daily.    . pantoprazole (PROTONIX) 40 MG tablet Take 40 mg by mouth daily.    Marland Kitchen  PARoxetine (PAXIL) 40 MG tablet Take 40 mg by mouth every morning.     . potassium chloride SA (K-DUR,KLOR-CON) 20 MEQ tablet Take 20 mEq by mouth daily.     . pramipexole (MIRAPEX) 0.5 MG tablet Take 1 mg by mouth 1 day or 1 dose.     . spironolactone (ALDACTONE) 25 MG tablet Take 25 mg by mouth daily.     . sucralfate (CARAFATE) 1 g tablet Take 1 g by mouth 4 (four) times daily -  with meals and at bedtime.     No current facility-administered medications for this visit.     OBJECTIVE: Vitals:   11/16/18 1341  BP: 137/76  Pulse: 65  Temp: 97.8 F (36.6 C)     Body mass index is 37.34 kg/m.    ECOG FS:0 - Asymptomatic  General: Well-developed, well-nourished, no acute distress. Eyes: Pink conjunctiva, anicteric sclera. HEENT: Normocephalic, moist mucous membranes. Lungs: Clear to auscultation bilaterally. Heart: Regular rate and rhythm. No rubs, murmurs, or gallops. Abdomen: Soft, nontender, nondistended. No organomegaly noted, normoactive bowel sounds. Musculoskeletal: No edema, cyanosis, or clubbing. Neuro: Alert, answering all questions appropriately. Cranial nerves grossly intact. Skin: No rashes or petechiae noted. Psych: Normal affect.  LAB RESULTS:  Lab Results  Component Value Date   NA 142 01/10/2015   K 4.3 01/10/2015   CL 110 (H) 01/10/2015   CO2 25 01/10/2015   GLUCOSE 144 (H) 01/10/2015   BUN 24 (H) 01/10/2015   CREATININE 1.13 01/10/2015   CALCIUM 9.2 01/10/2015   PROT 6.9 06/22/2014   ALBUMIN 3.2 (L) 06/22/2014   AST 10 (L) 06/22/2014   ALT 18 06/22/2014   ALKPHOS 104 06/22/2014   BILITOT 0.4 06/22/2014   GFRNONAA >60 01/10/2015   GFRAA >60 01/10/2015    Lab Results  Component Value Date   WBC 6.0 11/16/2018   NEUTROABS 4.3 11/16/2018   HGB 10.9 (L) 11/16/2018   HCT 34.8 (L) 11/16/2018   MCV 90.9 11/16/2018   PLT 155 11/16/2018   Lab Results  Component Value Date   IRON 64 11/16/2018   TIBC 362 11/16/2018   IRONPCTSAT 18 11/16/2018    Lab Results  Component Value Date   FERRITIN 15 (L) 11/16/2018     STUDIES: Mr Lumbar Spine Wo Contrast  Result Date: 11/16/2018 CLINICAL DATA:  72 year old male with bilateral leg pain and weakness since June. Unsteady gait. EXAM: MRI LUMBAR SPINE WITHOUT CONTRAST TECHNIQUE: Multiplanar, multisequence MR imaging of the lumbar spine was performed. No intravenous contrast was administered. COMPARISON:  CTA chest and CT Abdomen and Pelvis 01/27/2014. FINDINGS: Segmentation:  Normal on the comparison. Alignment: Chronic mild levoconvex lumbar spine curvature with straightening of lordosis. Stable vertebral height and alignment since 2015. No spondylolisthesis. Vertebrae: Diffuse chronic lumbar disc space loss with degenerative endplate spurring. Degenerative appearing mild to moderate endplate marrow edema at L3-L4 (series 5  image 10). No other marrow edema or acute osseous abnormality. Intact visible sacrum. Conus medullaris and cauda equina: Conus extends to the L1 level. No lower spinal cord or conus signal abnormality. Paraspinal and other soft tissues: Visible abdominal viscera appear stable since 2015 and negative. There is lumbar subcutaneous edema, but otherwise negative lumbar paraspinal soft tissues. Disc levels: T11-T12: Mild to moderate facet and ligament flavum hypertrophy. Mild to moderate bilateral T11 foraminal stenosis. T12-L1:  Negative. L1-L2: Disc space loss with circumferential disc bulge and endplate spurring eccentric to the right. Borderline to mild right lateral recess stenosis. L2-L3: Disc space loss with circumferential disc osteophyte complex, broad-based posterior component. Mild facet hypertrophy. Borderline to mild spinal, right lateral recess, and right L2 neural foraminal stenosis. L3-L4: Disc space loss with circumferential disc osteophyte complex. Mild to moderate facet and left ligament flavum hypertrophy. Moderate spinal stenosis with moderate to severe left lateral  recess stenosis (left L4 nerve level series 6, image 23). Mild to moderate bilateral L3 foraminal stenosis. L4-L5: Disc space loss with left eccentric circumferential disc osteophyte complex. Suspicion of developing interbody ankylosis at this level (series 3, image 14). Mild to moderate facet and ligament flavum hypertrophy mostly on the left. No significant spinal stenosis. Mild left lateral recess stenosis. Moderate left L4 foraminal stenosis. L5-S1: Disc space loss with left eccentric circumferential disc bulge and endplate spurring. Mild to moderate facet hypertrophy greater on the left. No spinal stenosis, but bulky left subarticular disc contributes to moderate to severe left lateral recess stenosis (descending left S1 nerve level series 6, image 34) and moderate to severe left L5 foraminal stenosis. IMPRESSION: 1. Diffuse severe lumbar disc and endplate degeneration. Degenerative appearing endplate marrow edema at L3-L4, while at L4-L5 there appears to be developing interbody ankylosis. 2. Multifactorial moderate spinal and moderate to severe left lateral recess stenosis at L3-L4 with up to moderate bilateral foraminal stenosis. 3. Moderate to severe left lateral recess and left neural foraminal stenosis at L5-S1. Electronically Signed   By: Genevie Ann M.D.   On: 11/16/2018 09:09    ASSESSMENT: Anemia in chronic kidney disease, mild iron deficiency.  PLAN:    1. Anemia in chronic kidney disease, mild iron deficiency: Patient's hemoglobin is decreased, but unchanged at 10.9.  His iron stores are also unchanged, although his ferritin level has trended down.  He does not require IV Feraheme today, but may require an infusion at his next clinic visit. He last received treatment on May 12, 2018.  Patient may benefit from Procrit in the future if his hemoglobin falls below 10.0 given his chronic renal insufficiency, but this is not necessary at this point.  Can also consider full anemia work-up in the future  if necessary.  Return to clinic in 3 months with repeat laboratory work and further evaluation.   2.  Chronic renal insufficiency: Chronic and unchanged.  Continue treatment and monitoring per nephrology. 3.  Vascular insufficiency: Continue follow-up with vascular surgery as scheduled. 4.  Back pain: Continue follow-up and treatment per orthopedics.   Patient expressed understanding and was in agreement with this plan. He also understands that He can call clinic at any time with any questions, concerns, or complaints.    Lloyd Huger, MD   11/19/2018 9:45 AM

## 2018-11-15 ENCOUNTER — Other Ambulatory Visit: Payer: Self-pay | Admitting: *Deleted

## 2018-11-15 DIAGNOSIS — D649 Anemia, unspecified: Secondary | ICD-10-CM

## 2018-11-16 ENCOUNTER — Ambulatory Visit
Admission: RE | Admit: 2018-11-16 | Discharge: 2018-11-16 | Disposition: A | Discharge status: Home / Self Care | Payer: Medicare Other | Source: Ambulatory Visit | Attending: Orthopedic Surgery | Admitting: Orthopedic Surgery

## 2018-11-16 ENCOUNTER — Inpatient Hospital Stay (HOSPITAL_BASED_OUTPATIENT_CLINIC_OR_DEPARTMENT_OTHER): Payer: Medicare Other | Admitting: Oncology

## 2018-11-16 ENCOUNTER — Inpatient Hospital Stay: Payer: Medicare Other | Attending: Oncology

## 2018-11-16 ENCOUNTER — Inpatient Hospital Stay: Payer: Medicare Other

## 2018-11-16 ENCOUNTER — Other Ambulatory Visit: Payer: Self-pay

## 2018-11-16 VITALS — BP 137/76 | HR 65 | Temp 97.8°F | Wt 283.0 lb

## 2018-11-16 DIAGNOSIS — Z79899 Other long term (current) drug therapy: Secondary | ICD-10-CM | POA: Diagnosis not present

## 2018-11-16 DIAGNOSIS — D649 Anemia, unspecified: Secondary | ICD-10-CM

## 2018-11-16 DIAGNOSIS — M5136 Other intervertebral disc degeneration, lumbar region: Secondary | ICD-10-CM | POA: Insufficient documentation

## 2018-11-16 DIAGNOSIS — M549 Dorsalgia, unspecified: Secondary | ICD-10-CM | POA: Diagnosis not present

## 2018-11-16 DIAGNOSIS — Z7984 Long term (current) use of oral hypoglycemic drugs: Secondary | ICD-10-CM | POA: Insufficient documentation

## 2018-11-16 DIAGNOSIS — Z87891 Personal history of nicotine dependence: Secondary | ICD-10-CM | POA: Diagnosis not present

## 2018-11-16 DIAGNOSIS — N189 Chronic kidney disease, unspecified: Secondary | ICD-10-CM | POA: Insufficient documentation

## 2018-11-16 DIAGNOSIS — M545 Low back pain, unspecified: Secondary | ICD-10-CM

## 2018-11-16 DIAGNOSIS — I10 Essential (primary) hypertension: Secondary | ICD-10-CM | POA: Diagnosis not present

## 2018-11-16 DIAGNOSIS — E119 Type 2 diabetes mellitus without complications: Secondary | ICD-10-CM | POA: Insufficient documentation

## 2018-11-16 DIAGNOSIS — G8929 Other chronic pain: Secondary | ICD-10-CM | POA: Insufficient documentation

## 2018-11-16 DIAGNOSIS — D631 Anemia in chronic kidney disease: Secondary | ICD-10-CM | POA: Insufficient documentation

## 2018-11-16 DIAGNOSIS — Z9884 Bariatric surgery status: Secondary | ICD-10-CM | POA: Diagnosis not present

## 2018-11-16 DIAGNOSIS — M4807 Spinal stenosis, lumbosacral region: Secondary | ICD-10-CM | POA: Diagnosis present

## 2018-11-16 DIAGNOSIS — D509 Iron deficiency anemia, unspecified: Secondary | ICD-10-CM

## 2018-11-16 LAB — CBC WITH DIFFERENTIAL/PLATELET
Abs Immature Granulocytes: 0.01 10*3/uL (ref 0.00–0.07)
Basophils Absolute: 0 10*3/uL (ref 0.0–0.1)
Basophils Relative: 1 %
Eosinophils Absolute: 0.2 10*3/uL (ref 0.0–0.5)
Eosinophils Relative: 3 %
HCT: 34.8 % — ABNORMAL LOW (ref 39.0–52.0)
Hemoglobin: 10.9 g/dL — ABNORMAL LOW (ref 13.0–17.0)
Immature Granulocytes: 0 %
Lymphocytes Relative: 19 %
Lymphs Abs: 1.1 10*3/uL (ref 0.7–4.0)
MCH: 28.5 pg (ref 26.0–34.0)
MCHC: 31.3 g/dL (ref 30.0–36.0)
MCV: 90.9 fL (ref 80.0–100.0)
Monocytes Absolute: 0.4 10*3/uL (ref 0.1–1.0)
Monocytes Relative: 6 %
Neutro Abs: 4.3 10*3/uL (ref 1.7–7.7)
Neutrophils Relative %: 71 %
Platelets: 155 10*3/uL (ref 150–400)
RBC: 3.83 MIL/uL — ABNORMAL LOW (ref 4.22–5.81)
RDW: 14.1 % (ref 11.5–15.5)
WBC: 6 10*3/uL (ref 4.0–10.5)
nRBC: 0 % (ref 0.0–0.2)

## 2018-11-16 LAB — FERRITIN: Ferritin: 15 ng/mL — ABNORMAL LOW (ref 24–336)

## 2018-11-16 LAB — IRON AND TIBC
Iron: 64 ug/dL (ref 45–182)
Saturation Ratios: 18 % (ref 17.9–39.5)
TIBC: 362 ug/dL (ref 250–450)
UIBC: 298 ug/dL

## 2018-11-16 NOTE — Progress Notes (Signed)
No treatment today per MD.  

## 2018-11-16 NOTE — Progress Notes (Signed)
Patient is here to follow up on his iron deficiency, anemia. Patient stated that he has been having back pain radiating to his right leg. Patient stated that he had seen Dr. Rudene Christians and he ordered a MRI of his back that was done today. I told patient to call his office for results.

## 2019-01-03 DIAGNOSIS — J449 Chronic obstructive pulmonary disease, unspecified: Secondary | ICD-10-CM | POA: Insufficient documentation

## 2019-01-20 ENCOUNTER — Other Ambulatory Visit: Payer: Self-pay | Admitting: Neurosurgery

## 2019-01-20 DIAGNOSIS — R29898 Other symptoms and signs involving the musculoskeletal system: Secondary | ICD-10-CM

## 2019-01-24 ENCOUNTER — Emergency Department
Admission: EM | Admit: 2019-01-24 | Discharge: 2019-01-24 | Disposition: A | Discharge status: Home / Self Care | Payer: Medicare Other | Attending: Emergency Medicine | Admitting: Emergency Medicine

## 2019-01-24 ENCOUNTER — Emergency Department: Payer: Medicare Other

## 2019-01-24 ENCOUNTER — Other Ambulatory Visit: Payer: Self-pay

## 2019-01-24 DIAGNOSIS — T148XXA Other injury of unspecified body region, initial encounter: Secondary | ICD-10-CM | POA: Diagnosis not present

## 2019-01-24 DIAGNOSIS — Y9301 Activity, walking, marching and hiking: Secondary | ICD-10-CM | POA: Diagnosis not present

## 2019-01-24 DIAGNOSIS — W19XXXA Unspecified fall, initial encounter: Secondary | ICD-10-CM

## 2019-01-24 DIAGNOSIS — Y998 Other external cause status: Secondary | ICD-10-CM | POA: Diagnosis not present

## 2019-01-24 DIAGNOSIS — E119 Type 2 diabetes mellitus without complications: Secondary | ICD-10-CM | POA: Insufficient documentation

## 2019-01-24 DIAGNOSIS — S0101XA Laceration without foreign body of scalp, initial encounter: Secondary | ICD-10-CM | POA: Insufficient documentation

## 2019-01-24 DIAGNOSIS — Y92481 Parking lot as the place of occurrence of the external cause: Secondary | ICD-10-CM | POA: Insufficient documentation

## 2019-01-24 DIAGNOSIS — J449 Chronic obstructive pulmonary disease, unspecified: Secondary | ICD-10-CM | POA: Insufficient documentation

## 2019-01-24 DIAGNOSIS — W01198A Fall on same level from slipping, tripping and stumbling with subsequent striking against other object, initial encounter: Secondary | ICD-10-CM | POA: Diagnosis not present

## 2019-01-24 DIAGNOSIS — I10 Essential (primary) hypertension: Secondary | ICD-10-CM | POA: Diagnosis not present

## 2019-01-24 DIAGNOSIS — S0990XA Unspecified injury of head, initial encounter: Secondary | ICD-10-CM | POA: Diagnosis present

## 2019-01-24 DIAGNOSIS — F17220 Nicotine dependence, chewing tobacco, uncomplicated: Secondary | ICD-10-CM | POA: Diagnosis not present

## 2019-01-24 LAB — CBC
HCT: 37.1 % — ABNORMAL LOW (ref 39.0–52.0)
Hemoglobin: 11.2 g/dL — ABNORMAL LOW (ref 13.0–17.0)
MCH: 28.9 pg (ref 26.0–34.0)
MCHC: 30.2 g/dL (ref 30.0–36.0)
MCV: 95.6 fL (ref 80.0–100.0)
Platelets: 182 10*3/uL (ref 150–400)
RBC: 3.88 MIL/uL — ABNORMAL LOW (ref 4.22–5.81)
RDW: 15.7 % — ABNORMAL HIGH (ref 11.5–15.5)
WBC: 6.6 10*3/uL (ref 4.0–10.5)
nRBC: 0 % (ref 0.0–0.2)

## 2019-01-24 LAB — URINALYSIS, COMPLETE (UACMP) WITH MICROSCOPIC
Bacteria, UA: NONE SEEN
Bilirubin Urine: NEGATIVE
Glucose, UA: NEGATIVE mg/dL
Ketones, ur: NEGATIVE mg/dL
Leukocytes, UA: NEGATIVE
Nitrite: NEGATIVE
Protein, ur: 30 mg/dL — AB
Specific Gravity, Urine: 1.01 (ref 1.005–1.030)
Squamous Epithelial / HPF: NONE SEEN (ref 0–5)
pH: 5 (ref 5.0–8.0)

## 2019-01-24 LAB — BASIC METABOLIC PANEL
Anion gap: 4 — ABNORMAL LOW (ref 5–15)
BUN: 41 mg/dL — ABNORMAL HIGH (ref 8–23)
CO2: 23 mmol/L (ref 22–32)
Calcium: 8.9 mg/dL (ref 8.9–10.3)
Chloride: 113 mmol/L — ABNORMAL HIGH (ref 98–111)
Creatinine, Ser: 1.66 mg/dL — ABNORMAL HIGH (ref 0.61–1.24)
GFR calc Af Amer: 48 mL/min — ABNORMAL LOW (ref >60)
GFR calc non Af Amer: 41 mL/min — ABNORMAL LOW (ref >60)
Glucose, Bld: 209 mg/dL — ABNORMAL HIGH (ref 70–99)
Potassium: 5.6 mmol/L — ABNORMAL HIGH (ref 3.5–5.1)
Sodium: 140 mmol/L (ref 135–145)

## 2019-01-24 MED ORDER — SODIUM CHLORIDE 0.9% FLUSH: 3.0000 mL | Freq: Once | INTRAVENOUS | Status: DC

## 2019-01-24 MED ORDER — SODIUM CHLORIDE 0.9 % IV SOLN
Freq: Once | INTRAVENOUS | Status: AC
  Administered 2019-01-24: 14:00:00 via INTRAVENOUS

## 2019-01-24 MED ORDER — MUPIROCIN 2 % EX OINT: TOPICAL_OINTMENT | CUTANEOUS | 0 refills | Status: DC

## 2019-01-24 MED ORDER — OXYCODONE-ACETAMINOPHEN 5-325 MG PO TABS
2.0000 | ORAL_TABLET | Freq: Once | ORAL | Status: AC
  Administered 2019-01-24: 2 via ORAL
  Filled 2019-01-24: qty 2

## 2019-01-24 MED ORDER — OXYCODONE-ACETAMINOPHEN 5-325 MG PO TABS: 1.0000 | ORAL_TABLET | Freq: Three times a day (TID) | ORAL | 0 refills | Status: DC | PRN

## 2019-01-24 NOTE — ED Notes (Signed)
Full rainbow sent to lab.  

## 2019-01-24 NOTE — ED Notes (Signed)
PT verbalizes d/c understanding and follow up as well as RX given. This RN cleaned pt wounds to face and RT hand. PT denies further concerns. Pt unable to sign due to sing autre pad malfnx

## 2019-01-24 NOTE — ED Triage Notes (Addendum)
Pt comes via POV from [place] following a mechanical fall. Pt states he was walking and tripped over the curb. Pt states he had his hands in his pocket and wasn't able to catch himself.  Pt did hit his head but denies any LOC. Pt states pain to right knee and right thumb. Pt has laceration noted to left side of face. Bandage applied and bleeding controlled at this time. Pt also has cuts noted to right hand and thumb.  Pt is on blood thinners.  Pt also states some weakness before the fall and left sided upper pain. Pt denies any chest pain or SOB.  Pt has spot on left inner lower leg that is red and seems to not be healing. Pt would like that looked at as well. Pt is diabetic.

## 2019-01-24 NOTE — ED Provider Notes (Signed)
[facility] Emergency Department Provider Note       Time seen: ----------------------------------------- 1:27 PM on 01/24/2019 -----------------------------------------   I have reviewed the triage vital signs and the nursing notes.  HISTORY   Chief Complaint Fall    HPI [name] is a 73 y.o. male with a history of back pain, COPD, depression, diabetes, hyperlipidemia, hypertension, renal cell carcinoma who presents to the ED for chemical fall.  Patient states he was walking and tripped over a curb in the parking lot.  He his hands were in his pockets and he was not able to catch himself.  He did hit his head but denies any loss of consciousness.  He was noted to have a laceration to the left side of his face.  He also had cuts to his right hand and thumb.  Past Medical History:  Diagnosis Date  . Chronic back pain   . COPD (chronic obstructive pulmonary disease) ([place])   . Depression   . Diabetes mellitus without complication ([place])   . Hyperlipidemia   . Hypertension   . Renal cell carcinoma ([place])   . Sleep apnea     Patient Active Problem List   Diagnosis Date Noted  . Atherosclerotic peripheral vascular disease ([place]) 09/01/2018  . Lymphedema 06/22/2018  . Iron deficiency anemia 05/01/2018  . Anemia in chronic kidney disease 04/26/2018  . Varicose veins of leg with swelling, bilateral 04/09/2018  . Bilateral lower extremity edema 02/23/2018  . Lower extremity pain, bilateral 02/23/2018  . Essential hypertension 02/23/2018  . Type 2 diabetes mellitus with complication ([place]) 29/47/6546    Past Surgical History:  Procedure Laterality Date  . CARPAL TUNNEL RELEASE    . CHOLECYSTECTOMY    . COLONOSCOPY WITH PROPOFOL N/A 07/29/2017   Procedure: COLONOSCOPY WITH PROPOFOL;  Surgeon: Manya Silvas, MD;  Location: [facility] ENDOSCOPY;  Service: Endoscopy;  Laterality: N/A;  . colonoscopys    . ESOPHAGOGASTRODUODENOSCOPY (EGD) WITH  PROPOFOL N/A 07/29/2017   Procedure: ESOPHAGOGASTRODUODENOSCOPY (EGD) WITH PROPOFOL;  Surgeon: Manya Silvas, MD;  Location: [facility] ENDOSCOPY;  Service: Endoscopy;  Laterality: N/A;  . FLEXIBLE SIGMOIDOSCOPY    . GASTRIC BYPASS    . NEPHRECTOMY    . open reduction and internal fixation, right distal radius    . right knee arthroscopy    . UPPER GI ENDOSCOPY      Allergies Patient has no known allergies.  Social History Social History   Tobacco Use  . Smoking status: Former Smoker    Packs/day: 1.00    Years: 30.00    Pack years: 30.00  . Smokeless tobacco: Current User    Types: Chew  Substance Use Topics  . Alcohol use: No    Frequency: Never  . Drug use: No   Review of Systems Constitutional: Negative for fever. Cardiovascular: Negative for chest pain. Respiratory: Negative for shortness of breath. Gastrointestinal: Negative for abdominal pain, vomiting and diarrhea. Musculoskeletal: Positive for right knee and right hand pain.  Positive for left-sided rib pain Skin: Positive for facial laceration, abrasion, right hand abrasion Neurological: Negative for headaches, focal weakness or numbness.  All systems negative/normal/unremarkable except as stated in the HPI  ____________________________________________   PHYSICAL EXAM:  VITAL SIGNS: ED Triage Vitals [01/24/19 1246]  Enc Vitals Group     BP (!) 129/106     Pulse Rate 74     Resp 18     Temp 98.2 F (36.8 C)     Temp src  SpO2 96 %     Weight 285 lb (129.3 kg)     Height 6' (1.829 m)     Head Circumference      Peak Flow      Pain Score 0     Pain Loc      Pain Edu?      Excl. in [place]?    Constitutional: Alert and oriented. Well appearing and in no distress. Eyes: Conjunctivae are normal. Normal extraocular movements. ENT      Head: Normocephalic, left periorbital laceration and abrasion      Nose: No congestion/rhinnorhea.      Mouth/Throat: Mucous membranes are moist.      Neck: No  stridor. Cardiovascular: Normal rate, regular rhythm. No murmurs, rubs, or gallops. Respiratory: Normal respiratory effort without tachypnea nor retractions. Breath sounds are clear and equal bilaterally. No wheezes/rales/rhonchi. Gastrointestinal: Soft and nontender. Normal bowel sounds Musculoskeletal: Mild right knee tenderness, mild right hand tenderness, left rib tenderness is noted Neurologic:  Normal speech and language. No gross focal neurologic deficits are appreciated.  Skin: Left-sided facial abrasion, right hand abrasion, left periorbital laceration Psychiatric: Mood and affect are normal. Speech and behavior are normal.  ____________________________________________  ED COURSE:  As part of my medical decision making, I reviewed the following data within the [place] History obtained from family if available, nursing notes, old chart and ekg, as well as notes from prior ED visits. Patient presented for mechanical fall, we will assess with labs and imaging as indicated at this time.   Marland Kitchen.Laceration Repair Date/Time: 01/24/2019 3:41 PM Performed by: Earleen Newport, MD Authorized by: Earleen Newport, MD   Consent:    Consent obtained:  Verbal   Consent given by:  Patient   Risks discussed:  Infection, pain, retained foreign body, poor cosmetic result and poor wound healing Anesthesia (see MAR for exact dosages):    Anesthesia method:  None Laceration details:    Length (cm):  2   Depth (mm):  3 Repair type:    Repair type:  Simple Exploration:    Hemostasis achieved with:  Direct pressure   Wound exploration: entire depth of wound probed and visualized     Contaminated: no   Treatment:    Visualized foreign bodies/material removed: no   Skin repair:    Repair method:  Tissue adhesive Approximation:    Approximation:  Close Post-procedure details:    Dressing:  Open (no dressing)   Patient tolerance of procedure:  Tolerated well, no  immediate complications   ____________________________________________   LABS (pertinent positives/negatives)  Labs Reviewed  BASIC METABOLIC PANEL - Abnormal; Notable for the following components:      Result Value   Potassium 5.6 (*)    Chloride 113 (*)    Glucose, Bld 209 (*)    BUN 41 (*)    Creatinine, Ser 1.66 (*)    GFR calc non Af Amer 41 (*)    GFR calc Af Amer 48 (*)    Anion gap 4 (*)    All other components within normal limits  CBC - Abnormal; Notable for the following components:   RBC 3.88 (*)    Hemoglobin 11.2 (*)    HCT 37.1 (*)    RDW 15.7 (*)    All other components within normal limits  URINALYSIS, COMPLETE (UACMP) WITH MICROSCOPIC  CBG MONITORING, ED    RADIOLOGY Images were viewed by me  CT head, left rib x-rays, right knee  x-rays, right hand x-rays Did not reveal any acute process ____________________________________________   DIFFERENTIAL DIAGNOSIS   Fall, contusion, fracture, subdural, laceration, abrasion, dehydration  FINAL ASSESSMENT AND PLAN  Fall, head injury, laceration, abrasion   Plan: The patient had presented for mechanical fall. Patient's labs did reveal some hyperkalemia and likely dehydration.  He was given a liter of saline here.  Patient's imaging did not reveal any acute process.  He is cleared for close outpatient follow-up.  I have advised that he needs to hold his spironolactone and potassium for 48 hours.   Laurence Aly, MD    Note: This note was generated in part or whole with voice recognition software. Voice recognition is usually quite accurate but there are transcription errors that can and very often do occur. I apologize for any typographical errors that were not detected and corrected.     Earleen Newport, MD 01/24/19 [phone]

## 2019-02-01 ENCOUNTER — Ambulatory Visit
Admission: RE | Admit: 2019-02-01 | Discharge: 2019-02-01 | Disposition: A | Discharge status: Home / Self Care | Payer: Medicare Other | Source: Ambulatory Visit | Attending: Neurosurgery | Admitting: Neurosurgery

## 2019-02-01 DIAGNOSIS — R29898 Other symptoms and signs involving the musculoskeletal system: Secondary | ICD-10-CM

## 2019-02-14 NOTE — Progress Notes (Signed)
[name]  [phone] [phone]) [phone]  ID: [name] OB: 07/18/1946  MR#: [id number]  AOZ#:[id number]  Patient Care Team: Derinda Late, MD as PCP - General (Family Medicine)  CHIEF COMPLAINT: Anemia in chronic kidney disease, mild iron deficiency.  INTERVAL HISTORY: Patient returns to clinic today for repeat laboratory work and further evaluation.  He continues to have significant back pain radiating down his leg and may have a neurostimulator placed in the next several weeks.  He otherwise feels well.  He does not complain of weakness or fatigue today. He has no neurologic complaints.  He denies any chest pain, shortness of breath, or cough.  He denies any abdominal pain.  He has no nausea, vomiting, constipation, or diarrhea.  He denies any melena or hematochezia.  He has no urinary complaints.  Patient offers no further specific complaints today.  REVIEW OF SYSTEMS:   Review of Systems  Constitutional: Negative for fever, malaise/fatigue and weight loss.  Respiratory: Negative for cough, hemoptysis and shortness of breath.   Cardiovascular: Negative.  Negative for chest pain and leg swelling.  Gastrointestinal: Negative.  Negative for abdominal pain and constipation.  Genitourinary: Negative.  Negative for hematuria.  Musculoskeletal: Positive for back pain.  Skin: Negative.  Negative for rash.  Neurological: Negative for sensory change, focal weakness and weakness.  Psychiatric/Behavioral: Negative.  The patient is not nervous/anxious.     As per HPI. Otherwise, a complete review of systems is negative.  PAST MEDICAL HISTORY: Past Medical History:  Diagnosis Date  . Chronic back pain   . COPD (chronic obstructive pulmonary disease) ([name])   . Depression   . Diabetes mellitus without complication ([name])   . Hyperlipidemia   . Hypertension   . Renal cell carcinoma ([name])   . Sleep apnea     PAST SURGICAL HISTORY: Past  Surgical History:  Procedure Laterality Date  . CARPAL TUNNEL RELEASE    . CHOLECYSTECTOMY    . COLONOSCOPY WITH PROPOFOL N/A 07/29/2017   Procedure: COLONOSCOPY WITH PROPOFOL;  Surgeon: Manya Silvas, MD;  [facility]: [name];  Service: Endoscopy;  Laterality: N/A;  . colonoscopys    . ESOPHAGOGASTRODUODENOSCOPY (EGD) WITH PROPOFOL N/A 07/29/2017   Procedure: ESOPHAGOGASTRODUODENOSCOPY (EGD) WITH PROPOFOL;  Surgeon: Manya Silvas, MD;  Location: [name];  Service: Endoscopy;  Laterality: N/A;  . FLEXIBLE SIGMOIDOSCOPY    . GASTRIC BYPASS    . NEPHRECTOMY    . open reduction and internal fixation, right distal radius    . right knee arthroscopy    . UPPER GI ENDOSCOPY      FAMILY HISTORY: Family History  Problem Relation Age of Onset  . Diabetes Mother   . Cancer Mother   . Heart disease Father     ADVANCED DIRECTIVES (Y/N):  N  HEALTH MAINTENANCE: Social History   Tobacco Use  . Smoking status: Former Smoker    Packs/day: 1.00    Years: 30.00    Pack years: 30.00  . Smokeless tobacco: Current User    Types: Chew  Substance Use Topics  . Alcohol use: No    Frequency: Never  . Drug use: No     Colonoscopy:  PAP:  Bone density:  Lipid panel:  No Known Allergies  Current Outpatient Medications  Medication Sig Dispense Refill  . Apixaban (ELIQUIS PO) Take 5 mg by mouth daily.     . carvedilol (COREG) 6.25 MG tablet Take 6.25 mg by mouth 2 (two) times daily with  a meal.    . enalapril (VASOTEC) 20 MG tablet Take 20 mg by mouth daily.    . furosemide (LASIX) 40 MG tablet Take 40 mg by mouth 2 (two) times daily.     Marland Kitchen gabapentin (NEURONTIN) 300 MG capsule Take 300 mg by mouth at bedtime.     Marland Kitchen glipiZIDE (GLUCOTROL) 5 MG tablet Take by mouth daily before breakfast.    . metolazone (ZAROXOLYN) 2.5 MG tablet     . Multiple Vitamin (MULTIVITAMIN) tablet Take 1 tablet by mouth daily.    Marland Kitchen oxyCODONE-acetaminophen (PERCOCET) 5-325 MG tablet Take 1  tablet by mouth every 8 (eight) hours as needed. 20 tablet 0  . pantoprazole (PROTONIX) 40 MG tablet Take 40 mg by mouth daily.    Marland Kitchen PARoxetine (PAXIL) 40 MG tablet Take 40 mg by mouth every morning.     . potassium chloride SA (K-DUR,KLOR-CON) 20 MEQ tablet Take 20 mEq by mouth daily.     . pramipexole (MIRAPEX) 0.5 MG tablet Take 1 mg by mouth 1 day or 1 dose.     . spironolactone (ALDACTONE) 25 MG tablet Take 25 mg by mouth daily.     . sucralfate (CARAFATE) 1 g tablet Take 1 g by mouth 4 (four) times daily -  with meals and at bedtime.     No current facility-administered medications for this visit.     OBJECTIVE: Vitals:   02/15/19 1311  BP: 105/68  Pulse: 82  Resp: 20  Temp: 98.4 F (36.9 C)  SpO2: 93%     Body mass index is 37.43 kg/m.    ECOG FS:0 - Asymptomatic  General: Well-developed, well-nourished, no acute distress. Eyes: Pink conjunctiva, anicteric sclera. HEENT: Normocephalic, moist mucous membranes. Lungs: Clear to auscultation bilaterally. Heart: Regular rate and rhythm. No rubs, murmurs, or gallops. Abdomen: Soft, nontender, nondistended. No organomegaly noted, normoactive bowel sounds. Musculoskeletal: No edema, cyanosis, or clubbing. Neuro: Alert, answering all questions appropriately. Cranial nerves grossly intact. Skin: No rashes or petechiae noted. Psych: Normal affect.  LAB RESULTS:  Lab Results  Component Value Date   NA 140 01/24/2019   K 5.6 (H) 01/24/2019   CL 113 (H) 01/24/2019   CO2 23 01/24/2019   GLUCOSE 209 (H) 01/24/2019   BUN 41 (H) 01/24/2019   CREATININE 1.66 (H) 01/24/2019   CALCIUM 8.9 01/24/2019   PROT 6.9 06/22/2014   ALBUMIN 3.2 (L) 06/22/2014   AST 10 (L) 06/22/2014   ALT 18 06/22/2014   ALKPHOS 104 06/22/2014   BILITOT 0.4 06/22/2014   GFRNONAA 41 (L) 01/24/2019   GFRAA 48 (L) 01/24/2019    Lab Results  Component Value Date   WBC 6.4 02/15/2019   NEUTROABS 4.8 02/15/2019   HGB 10.7 (L) 02/15/2019   HCT 35.3 (L)  02/15/2019   MCV 94.4 02/15/2019   PLT 196 02/15/2019   Lab Results  Component Value Date   IRON 61 02/15/2019   TIBC 433 02/15/2019   IRONPCTSAT 14 (L) 02/15/2019   Lab Results  Component Value Date   FERRITIN 21 (L) 02/15/2019     STUDIES: Dg Ribs Unilateral W/chest Left  Result Date: 01/24/2019 CLINICAL DATA:  Pain after fall EXAM: LEFT RIBS AND CHEST - 3+ VIEW COMPARISON:  February 03, 2018 FINDINGS: Cardiomediastinal silhouette is stable. No pneumothorax. No nodules or masses. No focal infiltrates. No rib fractures are noted. IMPRESSION: No rib fractures noted. Electronically Signed   By: Dorise Bullion III M.D   On: 01/24/2019 15:15  Dg Elbow Complete Left (3+view)  Result Date: 01/24/2019 CLINICAL DATA:  Pain after fall EXAM: LEFT ELBOW - COMPLETE 3+ VIEW COMPARISON:  None. FINDINGS: There is no evidence of fracture, dislocation, or joint effusion. There is no evidence of arthropathy or other focal bone abnormality. Soft tissues are unremarkable. IMPRESSION: Negative. Electronically Signed   By: Dorise Bullion III M.D   On: 01/24/2019 15:19   Ct Head Wo Contrast  Result Date: 01/24/2019 CLINICAL DATA:  Posttraumatic headache after fall. No loss of consciousness. EXAM: CT HEAD WITHOUT CONTRAST TECHNIQUE: Contiguous axial images were obtained from the base of the skull through the vertex without intravenous contrast. COMPARISON:  CT scan of January 09, 2015. FINDINGS: Brain: Mild diffuse cortical atrophy is noted. No mass effect or midline shift is noted. Ventricular size is within normal limits. There is no evidence of mass lesion, hemorrhage or acute infarction. Vascular: No hyperdense vessel or unexpected calcification. Skull: Normal. Negative for fracture or focal lesion. Sinuses/Orbits: Right maxillary mucous retention cyst is noted. Other: None. IMPRESSION: Mild diffuse cortical atrophy. No acute intracranial abnormality seen. Electronically Signed   By: Marijo Conception, M.D.    On: 01/24/2019 13:16   Mr Thoracic Spine Wo Contrast  Result Date: 02/01/2019 CLINICAL DATA:  Weakness of both lower extremities. EXAM: MRI THORACIC SPINE WITHOUT CONTRAST TECHNIQUE: Multiplanar, multisequence MR imaging of the thoracic spine was performed. No intravenous contrast was administered. COMPARISON:  None. FINDINGS: Alignment:  Normal Vertebrae: No fracture, evidence of discitis, or bone lesion. Cord: No convincing signal abnormality. No cord swelling or visible dilated canal vessels. Paraspinal and other soft tissues: Chronic cardiomegaly Disc levels: Spondylosis with bridging osteophytes in the lower thoracic spine. Generalized mild disc desiccation and narrowing with multiple small disc protrusions in the upper thoracic spine that do not cause cord compression. Generalized foraminal patency. IMPRESSION: No specific explanation for weakness. There is degenerative disease and spondylosis without cord compression. Electronically Signed   By: Monte Fantasia M.D.   On: 02/01/2019 11:11   Dg Knee Complete 4 Views Right  Result Date: 01/24/2019 CLINICAL DATA:  Pain after fall. EXAM: RIGHT KNEE - COMPLETE 4+ VIEW COMPARISON:  None. FINDINGS: Anterior soft tissue swelling. No fracture or dislocation. No joint effusion. No other acute abnormalities. IMPRESSION: Anterior soft tissue swelling.  No other abnormalities. Electronically Signed   By: Dorise Bullion III M.D   On: 01/24/2019 15:16   Dg Hand Complete Right  Result Date: 01/24/2019 CLINICAL DATA:  Pain after fall EXAM: RIGHT HAND - COMPLETE 3+ VIEW COMPARISON:  None. FINDINGS: Patient is status post previous repair to a distal radius fracture. Remote ulnar styloid fracture noted. No acute fractures are seen. IMPRESSION: No acute fractures noted. Electronically Signed   By: Dorise Bullion III M.D   On: 01/24/2019 15:18    ASSESSMENT: Anemia in chronic kidney disease, mild iron deficiency.  PLAN:    1. Anemia in chronic kidney  disease, mild iron deficiency: Patient's hemoglobin remains mildly decreased, but essentially unchanged at 10.7.  His iron saturation and ferritin level remain low, patient does not wish to pursue IV Feraheme today.  He last received treatment on May 12, 2018.  Patient may benefit from Procrit in the future if his hemoglobin falls below 10.0 given his chronic renal insufficiency, but this is not necessary at this point.  Can also consider full anemia work-up in the future if necessary.  Return to clinic in 3 months with repeat laboratory work, further evaluation, and  additional IV Feraheme. 2.  Chronic renal insufficiency: Chronic and unchanged.  Continue treatment and monitoring per nephrology. 3.  Vascular insufficiency: Continue follow-up with vascular surgery as scheduled. 4.  Back pain: Continue follow-up and treatment per orthopedics.  Patient states he is having a neurostimulator placed in the next several weeks.   Patient expressed understanding and was in agreement with this plan. He also understands that He can call clinic at any time with any questions, concerns, or complaints.    Lloyd Huger, MD   02/17/2019 6:38 AM

## 2019-02-15 ENCOUNTER — Other Ambulatory Visit: Payer: Self-pay

## 2019-02-15 ENCOUNTER — Inpatient Hospital Stay: Payer: Medicare Other

## 2019-02-15 ENCOUNTER — Inpatient Hospital Stay (HOSPITAL_BASED_OUTPATIENT_CLINIC_OR_DEPARTMENT_OTHER): Payer: Medicare Other | Admitting: Oncology

## 2019-02-15 ENCOUNTER — Inpatient Hospital Stay: Payer: Medicare Other | Attending: Oncology

## 2019-02-15 ENCOUNTER — Encounter: Payer: Self-pay | Admitting: Oncology

## 2019-02-15 VITALS — BP 105/68 | HR 82 | Temp 98.4°F | Resp 20 | Wt 276.0 lb

## 2019-02-15 DIAGNOSIS — Z87891 Personal history of nicotine dependence: Secondary | ICD-10-CM | POA: Insufficient documentation

## 2019-02-15 DIAGNOSIS — Z7984 Long term (current) use of oral hypoglycemic drugs: Secondary | ICD-10-CM | POA: Insufficient documentation

## 2019-02-15 DIAGNOSIS — Z79899 Other long term (current) drug therapy: Secondary | ICD-10-CM | POA: Insufficient documentation

## 2019-02-15 DIAGNOSIS — N189 Chronic kidney disease, unspecified: Secondary | ICD-10-CM | POA: Insufficient documentation

## 2019-02-15 DIAGNOSIS — Z905 Acquired absence of kidney: Secondary | ICD-10-CM | POA: Insufficient documentation

## 2019-02-15 DIAGNOSIS — G4730 Sleep apnea, unspecified: Secondary | ICD-10-CM | POA: Diagnosis not present

## 2019-02-15 DIAGNOSIS — Z9884 Bariatric surgery status: Secondary | ICD-10-CM | POA: Diagnosis not present

## 2019-02-15 DIAGNOSIS — D631 Anemia in chronic kidney disease: Secondary | ICD-10-CM | POA: Insufficient documentation

## 2019-02-15 DIAGNOSIS — E1122 Type 2 diabetes mellitus with diabetic chronic kidney disease: Secondary | ICD-10-CM | POA: Diagnosis not present

## 2019-02-15 DIAGNOSIS — E785 Hyperlipidemia, unspecified: Secondary | ICD-10-CM | POA: Diagnosis not present

## 2019-02-15 DIAGNOSIS — M549 Dorsalgia, unspecified: Secondary | ICD-10-CM

## 2019-02-15 DIAGNOSIS — D649 Anemia, unspecified: Secondary | ICD-10-CM

## 2019-02-15 DIAGNOSIS — I129 Hypertensive chronic kidney disease with stage 1 through stage 4 chronic kidney disease, or unspecified chronic kidney disease: Secondary | ICD-10-CM | POA: Insufficient documentation

## 2019-02-15 DIAGNOSIS — F329 Major depressive disorder, single episode, unspecified: Secondary | ICD-10-CM | POA: Insufficient documentation

## 2019-02-15 DIAGNOSIS — J449 Chronic obstructive pulmonary disease, unspecified: Secondary | ICD-10-CM | POA: Diagnosis not present

## 2019-02-15 DIAGNOSIS — D509 Iron deficiency anemia, unspecified: Secondary | ICD-10-CM

## 2019-02-15 LAB — IRON AND TIBC
Iron: 61 ug/dL (ref 45–182)
Saturation Ratios: 14 % — ABNORMAL LOW (ref 17.9–39.5)
TIBC: 433 ug/dL (ref 250–450)
UIBC: 372 ug/dL

## 2019-02-15 LAB — CBC WITH DIFFERENTIAL/PLATELET
Abs Immature Granulocytes: 0.01 10*3/uL (ref 0.00–0.07)
Basophils Absolute: 0.1 10*3/uL (ref 0.0–0.1)
Basophils Relative: 1 %
Eosinophils Absolute: 0.1 10*3/uL (ref 0.0–0.5)
Eosinophils Relative: 2 %
HCT: 35.3 % — ABNORMAL LOW (ref 39.0–52.0)
Hemoglobin: 10.7 g/dL — ABNORMAL LOW (ref 13.0–17.0)
Immature Granulocytes: 0 %
Lymphocytes Relative: 16 %
Lymphs Abs: 1 10*3/uL (ref 0.7–4.0)
MCH: 28.6 pg (ref 26.0–34.0)
MCHC: 30.3 g/dL (ref 30.0–36.0)
MCV: 94.4 fL (ref 80.0–100.0)
Monocytes Absolute: 0.3 10*3/uL (ref 0.1–1.0)
Monocytes Relative: 5 %
Neutro Abs: 4.8 10*3/uL (ref 1.7–7.7)
Neutrophils Relative %: 76 %
Platelets: 196 10*3/uL (ref 150–400)
RBC: 3.74 MIL/uL — ABNORMAL LOW (ref 4.22–5.81)
RDW: 14.9 % (ref 11.5–15.5)
WBC: 6.4 10*3/uL (ref 4.0–10.5)
nRBC: 0 % (ref 0.0–0.2)

## 2019-02-15 LAB — FERRITIN: Ferritin: 21 ng/mL — ABNORMAL LOW (ref 24–336)

## 2019-03-08 ENCOUNTER — Ambulatory Visit: Payer: Medicare Other | Admitting: Student in an Organized Health Care Education/Training Program

## 2019-05-05 NOTE — Progress Notes (Signed)
Patient's Name: [name]  MRN: [id number]  Referring Provider: Derinda Late, MD  DOB: 09/27/1946  PCP: Derinda Late, MD  DOS: 05/11/2019  Note by: Curtis Santa, MD  Service setting: Ambulatory outpatient  Specialty: Interventional Pain Management  Location: [facility] Management Virtual Visit  Visit type: Initial Patient Evaluation  Patient type: New Patient   Pain Management Virtual Encounter Note - Virtual Visit via [place] (real-time audio visits between healthcare provider and patient).  Patient's Phone No.:  [phone] (home); [phone] (mobile); (Preferred) [phone] No e-mail address on record  [id number] [id number]: [phone]: [phone]  Pre-screening note:  Our staff contacted Mr. CConnorsand offered him an "in person", "face-to-face" appointment versus a telephone encounter. He indicated preferring the telephone encounter, at this time.  Primary Reason(s) for Visit: Tele-Encounter for initial evaluation of one or more chronic problems (new to examiner) potentially causing chronic pain, and posing a threat to normal musculoskeletal function. (Level of risk: High) CC: Back Pain  I contacted Curtis Constableon 05/11/2019 at 2:05 PM via video conference.      I clearly identified myself as BGillis Santa MD. I verified that I was speaking with the correct person using two identifiers (Name and date of birth: 110-09-1946.  Advanced Informed Consent I sought verbal advanced consent from Curtis Constablefor virtual visit interactions. I informed Mr. CPatinoof possible security and privacy concerns, risks, and limitations associated with providing "not-in-person" medical evaluation and management services. I also informed [name] the availability of "in-person" appointments. Finally, I informed him that there would be a charge for the  virtual visit and that he could be  personally, fully or partially, financially responsible for it. Mr. CJanisexpressed understanding and agreed to proceed.   HPI  [name] a 73y.o. year old, male patient, contacted today for an initial evaluation of his chronic pain. He has Bilateral lower extremity edema; Lower extremity pain, bilateral; Essential hypertension; Type II or unspecified type diabetes mellitus without mention of complication, not stated as uncontrolled; Varicose veins of leg with swelling, bilateral; Anemia in chronic kidney disease; Iron deficiency anemia; Lymphedema; Atherosclerotic peripheral vascular disease ([place]; Chronic kidney disease, unspecified; Chronic obstructive pulmonary disease ([place]; H/O deep venous thrombosis; Lumbar degenerative disc disease; Spinal stenosis, lumbar region, with neurogenic claudication; Ankylosis of lumbar spine; Neuroforaminal stenosis of lumbar spine (left, L5/S1); Lumbar facet arthropathy; and Chronic pain syndrome on their problem list.  Pain Assessment: Location: Right, Left, Lower Back(back and legs) Radiating: Pain radiaites down both leg to foot Onset: More than a month ago Duration: Chronic pain Quality: Aching, Burning, Throbbing, Nagging, Discomfort, Constant, Pressure Severity: 7 /10 (subjective, self-reported pain score)  Effect on ADL: limits my daily activities Timing: Constant Modifying factors: just keep moving  Onset and Duration: Date of onset: more than a year Cause of pain: Unknown Severity: Getting worse, NAS-11 at its worse: 107/10, NAS-11 at its best: 7/10, NAS-11 now: 7/10 and NAS-11 on the average: 7/10 Timing: Not influenced by the time of the day Aggravating Factors: Bending, Climbing, Lifiting, Prolonged standing and Walking Alleviating Factors: nothing, i just keep moving Associated Problems: Depression, Fatigue and Weakness Quality of Pain: Aching, Burning, Exhausting, Sharp and Throbbing Previous  Examinations or Tests: MRI scan and Nerve conduction test Previous Treatments: Epidural steroid injections, Narcotic medications, Physical Therapy and Steroid treatments by mouth  Patient is a pleasant  73 year old male with a chief complaint of low back pain with radiation into bilateral lower extremities in a dermatomal fashion.  Patient also endorses heaviness and pain in his legs making it difficult for him to walk.  Patient has a history of varicose veins and associated lymphedema status post bilateral laser ablation with vascular surgery that helped somewhat.  Patient also has a history of DVT and pulmonary embolus.  He is currently on Eliquis daily.  This is managed by Dr. Holley Raring with nephrology.  Patient previously did have an IVC filter in place but that was removed.  Patient states that his quality of life is very poor.  He has very limited functional status given his pain, weakness, discomfort.  Patient has to walk while maintaining forward flexion.  He is unable to stand up erect.  He states that his balance is also affected.  Patient did experience a fall earlier this year requiring him to go to the ED.  Patient also endorses numbness and tingling in his lower extremities.  Patient has been evaluated by Dr. Rudene Christians, lumbar MRI was ordered.  Results of which are below but shows lumbar spinal stenosis, lumbar degenerative disc disease, lumbar neuroforaminal stenosis.  Patient has tried physical therapy without much benefit.  He is status post bilateral L4-L5 transforaminal epidural steroid injections with Dr. Santa Lighter on 12/22/2018 which he states was not beneficial.  He is also been evaluated by Dr. Lacinda Axon with neurosurgery and surgery was advised against.  Patient's thoracic MRI is unremarkable for canal or foraminal stenosis.  Patient is being referred to the pain clinic for evaluation and consideration of interventional pain therapies.  The patient was informed that my practice is divided into two  sections: an interventional pain management section, as well as a completely separate and distinct medication management section. I explained that I have procedure days for my interventional therapies, and evaluation days for follow-ups and medication management. Because of the amount of documentation required during both, they are kept separated. This means that there is the possibility that he may be scheduled for a procedure on one day, and medication management the next. I have also informed him that because of staffing and facility limitations, I no longer take patients for medication management only. To illustrate the reasons for this, I gave the patient the example of surgeons, and how inappropriate it would be to refer a patient to his/her care, just to write for the post-surgical antibiotics on a surgery done by a different surgeon.   Because interventional pain management is my board-certified specialty, the patient was informed that joining my practice means that they are open to any and all interventional therapies. I made it clear that this does not mean that they will be forced to have any procedures done. What this means is that I believe interventional therapies to be essential part of the diagnosis and proper management of chronic pain conditions. Therefore, patients not interested in these interventional alternatives will be better served under the care of a different practitioner.  The patient was also made aware of my Comprehensive Pain Management Safety Guidelines where by joining my practice, they limit all of their nerve blocks and joint injections to those done by our practice, for as long as we are retained to manage their care.   Historic Controlled Substance Pharmacotherapy Review   01/24/2019  2   01/24/2019  Oxycodone-Acetaminophen 5-325  20.00 7 Ellen Henri   [id number]   [place] ([id number])   0  21.43  MME  Private Pay   [place]  01/03/2019  2   01/03/2019  Tramadol Hcl 50 MG Tablet  24.00 6 Ma Bab    [id number]   [place] ([id number])   0  20.00 MME  Medicare  Medicare Insurance coverage [place]    Medications: Bottles not available for inspection. Pharmacodynamics: Desired effects: Analgesia: The patient reports <50% benefit. Reported improvement in function: The patient reports medications have not provided significant benefit Clinically meaningful improvement in function (CMIF): Medication does not meet basic CMIF Perceived effectiveness: Described as ineffective and would like to make some changes Undesirable effects: Side-effects or Adverse reactions: None reported Historical Monitoring: The patient  reports no history of drug use. List of all UDS Test(s): No results found for: MDMA, COCAINSCRNUR, [place], [place], CANNABQUANT, THCU, [place] List of other Serum/Urine Drug Screening Test(s):  No results found for: AMPHSCRSER, BARBSCRSER, BENZOSCRSER, COCAINSCRSER, COCAINSCRNUR, PCPSCRSER, PCPQUANT, THCSCRSER, THCU, CANNABQUANT, OPIATESCRSER, OXYSCRSER, PROPOXSCRSER, ETH Historical Background Evaluation: [place] PMP: PDMP reviewed during this encounter. Six (6) year initial data search conducted.             [place] Department of public safety, offender search: Editor, commissioning Information) Non-contributory Risk Assessment Profile: Aberrant behavior: None observed or detected today Risk factors for fatal opioid overdose: None identified today Fatal overdose hazard ratio (HR): Calculation deferred Non-fatal overdose hazard ratio (HR): Calculation deferred Risk of opioid abuse or dependence: 0.7-3.0% with doses ? 36 MME/day and 6.1-26% with doses ? 120 MME/day. Substance use disorder (SUD) risk level: See below Personal History of Substance Abuse (SUD-Substance use disorder):  Alcohol: Negative  Illegal Drugs: Negative  Rx Drugs: Negative  ORT Risk Level calculation: Low Risk Opioid Risk Tool - 05/11/19 1116      Family History of Substance Abuse   Alcohol  Negative    Illegal Drugs  Negative    Rx Drugs   Negative      Personal History of Substance Abuse   Alcohol  Negative    Illegal Drugs  Negative    Rx Drugs  Negative      Age   Age between 67-45 years   No      History of Preadolescent Sexual Abuse   History of Preadolescent Sexual Abuse  Negative or Male      Psychological Disease   Psychological Disease  Negative    Depression  Positive      Total Score   Opioid Risk Tool Scoring  1    Opioid Risk Interpretation  Low Risk      ORT Scoring interpretation table:  Score <3 = Low Risk for SUD  Score between 4-7 = Moderate Risk for SUD  Score >8 = High Risk for Opioid Abuse   Pharmacologic Plan: As per protocol, I have not taken over any controlled substance management, pending the results of ordered tests and/or consults.            Initial impression: Pending review of available data and ordered tests.  Meds   Current Outpatient Medications:  .  Apixaban (ELIQUIS PO), Take 5 mg by mouth daily. , Disp: , Rfl:  .  carvedilol (COREG) 6.25 MG tablet, Take 6.25 mg by mouth 2 (two) times daily with a meal., Disp: , Rfl:  .  enalapril (VASOTEC) 20 MG tablet, Take 20 mg by mouth daily., Disp: , Rfl:  .  furosemide (LASIX) 40 MG tablet, Take 40 mg by mouth 2 (two) times daily. , Disp: , Rfl:  .  gabapentin (NEURONTIN)  300 MG capsule, Take 300 mg by mouth at bedtime. , Disp: , Rfl:  .  glipiZIDE (GLUCOTROL) 5 MG tablet, Take 10 mg by mouth daily before breakfast. , Disp: , Rfl:  .  metolazone (ZAROXOLYN) 2.5 MG tablet, , Disp: , Rfl:  .  oxyCODONE-acetaminophen (PERCOCET) 5-325 MG tablet, Take 1 tablet by mouth every 8 (eight) hours as needed., Disp: 20 tablet, Rfl: 0 .  pantoprazole (PROTONIX) 40 MG tablet, Take 40 mg by mouth daily., Disp: , Rfl:  .  PARoxetine (PAXIL) 40 MG tablet, Take 40 mg by mouth every morning. , Disp: , Rfl:  .  pramipexole (MIRAPEX) 0.5 MG tablet, Take 1 mg by mouth 1 day or 1 dose. , Disp: , Rfl:  .  sucralfate (CARAFATE) 1 g tablet, Take 1 g by mouth  4 (four) times daily -  with meals and at bedtime., Disp: , Rfl:  .  traMADol (ULTRAM) 50 MG tablet, Take 50 mg by mouth every 6 (six) hours as needed., Disp: , Rfl:  .  Multiple Vitamin (MULTIVITAMIN) tablet, Take 1 tablet by mouth daily., Disp: , Rfl:  .  potassium chloride SA (K-DUR,KLOR-CON) 20 MEQ tablet, Take 20 mEq by mouth daily. , Disp: , Rfl:  .  spironolactone (ALDACTONE) 25 MG tablet, Take 25 mg by mouth daily. , Disp: , Rfl:   ROS  Cardiovascular: High blood pressure and Blood thinners:  Antiplatelet Pulmonary or Respiratory: Lung problems and Shortness of breath, COPD Neurological: No reported neurological signs or symptoms such as seizures, abnormal skin sensations, urinary and/or fecal incontinence, being born with an abnormal open spine and/or a tethered spinal cord Review of Past Neurological Studies:  Results for orders placed or performed during the hospital encounter of 01/24/19  CT HEAD WO CONTRAST   Narrative   CLINICAL DATA:  Posttraumatic headache after fall. No loss of consciousness.  EXAM: CT HEAD WITHOUT CONTRAST  TECHNIQUE: Contiguous axial images were obtained from the base of the skull through the vertex without intravenous contrast.  COMPARISON:  CT scan of January 09, 2015.  FINDINGS: Brain: Mild diffuse cortical atrophy is noted. No mass effect or midline shift is noted. Ventricular size is within normal limits. There is no evidence of mass lesion, hemorrhage or acute infarction.  Vascular: No hyperdense vessel or unexpected calcification.  Skull: Normal. Negative for fracture or focal lesion.  Sinuses/Orbits: Right maxillary mucous retention cyst is noted.  Other: None.  IMPRESSION: Mild diffuse cortical atrophy. No acute intracranial abnormality seen.   Electronically Signed   By: Marijo Conception, M.D.   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 Hematological: No reported hematological signs or symptoms such as prolonged bleeding, low or poor functioning platelets, bruising or bleeding easily, hereditary bleeding problems, low energy levels due to low hemoglobin or being anemic Endocrine: High blood sugar controlled without the use of insulin (NIDDM) Rheumatologic: No reported rheumatological signs and symptoms such as fatigue, joint pain, tenderness, swelling, redness, heat, stiffness, decreased range of motion, with or without associated rash Musculoskeletal: Negative for myasthenia gravis, muscular dystrophy, multiple sclerosis or malignant hyperthermia Work History: Retired and Disabled  Allergies  Mr. Clute has No Known Allergies.  Laboratory Chemistry  Inflammation Markers (CRP: Acute Phase) (ESR: Chronic Phase) No results found for: CRP, ESRSEDRATE, LATICACIDVEN  Rheumatology Markers No results found for: RF, ANA, LABURIC, URICUR, LYMEIGGIGMAB, LYMEABIGMQN, HLAB27                      Renal Function Markers Lab Results  Component Value Date   BUN 41 (H) 01/24/2019   CREATININE 1.66 (H) 01/24/2019   GFRAA 48 (L) 01/24/2019   GFRNONAA 41 (L) 01/24/2019                             Hepatic Function Markers Lab Results  Component Value Date   AST 10 (L) 06/22/2014   ALT 18 06/22/2014   ALBUMIN 3.2 (L) 06/22/2014   ALKPHOS 104 06/22/2014   LIPASE 209 02/12/2014                        Electrolytes Lab Results  Component Value Date   NA 140 01/24/2019   K 5.6 (H) 01/24/2019   CL 113 (H) 01/24/2019   CALCIUM 8.9 01/24/2019   MG 1.7 (L) 06/22/2014   PHOS 2.7 06/22/2014                        Neuropathy Markers Lab Results  Component Value Date   HGBA1C 5.5 01/27/2014                          Coagulation Parameters Lab Results  Component Value Date   INR 1.1 06/22/2014   LABPROT 13.7 06/22/2014    APTT 48.3 (H) 02/12/2014   PLT 196 02/15/2019                        Cardiovascular Markers Lab Results  Component Value Date   BNP 393 (H) 01/26/2014   TROPONINI < 0.02 06/22/2014   HGB 10.7 (L) 02/15/2019   HCT 35.3 (L) 02/15/2019                         ID Markers No results found for: LYMEIGGIGMAB, HIV                      CA Markers No results found for: CEA, CA125, LABCA2                      Endocrine Markers Lab Results  Component Value Date   TSH 1.29 06/24/2014                        Note: Lab results reviewed.  Imaging Review   Thoracic Imaging: Thoracic MR wo contrast:  Results for orders placed during the hospital encounter of 02/01/19  MR THORACIC SPINE WO CONTRAST   Narrative CLINICAL DATA:  Weakness of both lower extremities.  EXAM: MRI THORACIC SPINE WITHOUT CONTRAST  TECHNIQUE: Multiplanar, multisequence MR imaging of the thoracic spine was performed. No intravenous contrast was administered.  COMPARISON:  None.  FINDINGS: Alignment:  Normal  Vertebrae: No fracture, evidence of discitis, or bone lesion.  Cord: No convincing signal abnormality. No cord swelling or visible dilated canal vessels.  Paraspinal and other soft tissues: Chronic cardiomegaly  Disc levels:  Spondylosis with bridging osteophytes in the lower thoracic spine. Generalized mild disc desiccation and narrowing with multiple small disc protrusions in the upper thoracic spine that do not cause  cord compression. Generalized foraminal patency.  IMPRESSION: No specific explanation for weakness. There is degenerative disease and spondylosis without cord compression.   Electronically Signed   By: Monte Fantasia M.D.   On: 02/01/2019 11:11     Lumbosacral Imaging: Lumbar MR wo contrast:  Results for orders placed during the hospital encounter of 11/16/18  MR LUMBAR SPINE WO CONTRAST   Narrative CLINICAL DATA:  73 year old male with bilateral leg pain  and weakness since June. Unsteady gait.  EXAM: MRI LUMBAR SPINE WITHOUT CONTRAST  TECHNIQUE: Multiplanar, multisequence MR imaging of the lumbar spine was performed. No intravenous contrast was administered.  COMPARISON:  CTA chest and CT Abdomen and Pelvis 01/27/2014.  FINDINGS: Segmentation:  Normal on the comparison.  Alignment: Chronic mild levoconvex lumbar spine curvature with straightening of lordosis. Stable vertebral height and alignment since 2015. No spondylolisthesis.  Vertebrae: Diffuse chronic lumbar disc space loss with degenerative endplate spurring. Degenerative appearing mild to moderate endplate marrow edema at L3-L4 (series 5 image 10). No other marrow edema or acute osseous abnormality. Intact visible sacrum.  Conus medullaris and cauda equina: Conus extends to the L1 level. No lower spinal cord or conus signal abnormality.  Paraspinal and other soft tissues: Visible abdominal viscera appear stable since 2015 and negative. There is lumbar subcutaneous edema, but otherwise negative lumbar paraspinal soft tissues.  Disc levels:  T11-T12: Mild to moderate facet and ligament flavum hypertrophy. Mild to moderate bilateral T11 foraminal stenosis.  T12-L1:  Negative.  L1-L2: Disc space loss with circumferential disc bulge and endplate spurring eccentric to the right. Borderline to mild right lateral recess stenosis.  L2-L3: Disc space loss with circumferential disc osteophyte complex, broad-based posterior component. Mild facet hypertrophy. Borderline to mild spinal, right lateral recess, and right L2 neural foraminal stenosis.  L3-L4: Disc space loss with circumferential disc osteophyte complex. Mild to moderate facet and left ligament flavum hypertrophy. Moderate spinal stenosis with moderate to severe left lateral recess stenosis (left L4 nerve level series 6, image 23). Mild to moderate bilateral L3 foraminal stenosis.  L4-L5: Disc space loss  with left eccentric circumferential disc osteophyte complex. Suspicion of developing interbody ankylosis at this level (series 3, image 14). Mild to moderate facet and ligament flavum hypertrophy mostly on the left. No significant spinal stenosis. Mild left lateral recess stenosis. Moderate left L4 foraminal stenosis.  L5-S1: Disc space loss with left eccentric circumferential disc bulge and endplate spurring. Mild to moderate facet hypertrophy greater on the left. No spinal stenosis, but bulky left subarticular disc contributes to moderate to severe left lateral recess stenosis (descending left S1 nerve level series 6, image 34) and moderate to severe left L5 foraminal stenosis.  IMPRESSION: 1. Diffuse severe lumbar disc and endplate degeneration. Degenerative appearing endplate marrow edema at L3-L4, while at L4-L5 there appears to be developing interbody ankylosis. 2. Multifactorial moderate spinal and moderate to severe left lateral recess stenosis at L3-L4 with up to moderate bilateral foraminal stenosis. 3. Moderate to severe left lateral recess and left neural foraminal stenosis at L5-S1.   Electronically Signed   By: Genevie Ann M.D.   On: 11/16/2018 09:09   Knee-R DG 4 views:  Results for orders placed during the hospital encounter of 01/24/19  DG Knee Complete 4 Views Right   Narrative CLINICAL DATA:  Pain after fall.  EXAM: RIGHT KNEE - COMPLETE 4+ VIEW  COMPARISON:  None.  FINDINGS: Anterior soft tissue swelling. No fracture or dislocation. No joint effusion. No other acute abnormalities.  IMPRESSION: Anterior soft tissue swelling.  No other abnormalities.   Electronically Signed   By: Dorise Bullion III M.D   On: 01/24/2019 15:16    Elbow-L DG Complete:  Results for orders placed during the hospital encounter of 01/24/19  DG ELBOW COMPLETE LEFT (3+VIEW)   Narrative CLINICAL DATA:  Pain after fall  EXAM: LEFT ELBOW - COMPLETE 3+ VIEW  COMPARISON:   None.  FINDINGS: There is no evidence of fracture, dislocation, or joint effusion. There is no evidence of arthropathy or other focal bone abnormality. Soft tissues are unremarkable.  IMPRESSION: Negative.   Electronically Signed   By: Dorise Bullion III M.D   On: 01/24/2019 15:19    Hand Imaging: Hand-R DG Complete:  Results for orders placed during the hospital encounter of 01/24/19  DG Hand Complete Right   Narrative CLINICAL DATA:  Pain after fall  EXAM: RIGHT HAND - COMPLETE 3+ VIEW  COMPARISON:  None.  FINDINGS: Patient is status post previous repair to a distal radius fracture. Remote ulnar styloid fracture noted. No acute fractures are seen.  IMPRESSION: No acute fractures noted.   Electronically Signed   By: Dorise Bullion III M.D   On: 01/24/2019 15:18   Complexity Note: Imaging results reviewed. Results shared with Mr. Debow, using Layman's terms.                         [place]  Drug: Mr. Sackrider  reports no history of drug use. Alcohol:  reports no history of alcohol use. Tobacco:  reports that he has quit smoking. He has a 30.00 pack-year smoking history. His smokeless tobacco use includes chew. Medical:  has a past medical history of Chronic back pain, COPD (chronic obstructive pulmonary disease) ([place]), Depression, Diabetes mellitus without complication ([place]), Hyperlipidemia, Hypertension, Renal cell carcinoma ([place]), and Sleep apnea. Family: family history includes Cancer in his mother; Diabetes in his mother; Heart disease in his father.  Past Surgical History:  Procedure Laterality Date  . CARPAL TUNNEL RELEASE    . CHOLECYSTECTOMY    . COLONOSCOPY WITH PROPOFOL N/A 07/29/2017   Procedure: COLONOSCOPY WITH PROPOFOL;  Surgeon: Manya Silvas, MD;  Location[facility] ENDOSCOPY;  Service: Endoscopy;  Laterality: N/A;  . colonoscopys    . ESOPHAGOGASTRODUODENOSCOPY (EGD) WITH PROPOFOL N/A 07/29/2017   Procedure: ESOPHAGOGASTRODUODENOSCOPY (EGD)  WITH PROPOFOL;  Surgeon: Manya Silvas, MD;  Location[facility] ENDOSCOPY;  Service: Endoscopy;  Laterality: N/A;  . FLEXIBLE SIGMOIDOSCOPY    . GASTRIC BYPASS    . NEPHRECTOMY    . open reduction and internal fixation, right distal radius    . right knee arthroscopy    . UPPER GI ENDOSCOPY     Active Ambulatory Problems    Diagnosis Date Noted  . Bilateral lower extremity edema 02/23/2018  . Lower extremity pain, bilateral 02/23/2018  . Essential hypertension 02/23/2018  . Type II or unspecified type diabetes mellitus without mention of complication, not stated as uncontrolled 06/19/2014  . Varicose veins of leg with swelling, bilateral 04/09/2018  . Anemia in chronic kidney disease 04/26/2018  . Iron deficiency anemia 05/01/2018  . Lymphedema 06/22/2018  . Atherosclerotic peripheral vascular disease ([place]) 09/01/2018  . Chronic kidney disease, unspecified 06/19/2014  . Chronic obstructive pulmonary disease ([place]) 01/03/2019  . H/O deep venous thrombosis 01/23/2017  . Lumbar degenerative disc disease 05/11/2019  . Spinal stenosis, lumbar region, with neurogenic claudication 05/11/2019  . Ankylosis of lumbar spine 05/11/2019  . Neuroforaminal  stenosis of lumbar spine (left, L5/S1) 05/11/2019  . Lumbar facet arthropathy 05/11/2019  . Chronic pain syndrome 05/11/2019   Resolved Ambulatory Problems    Diagnosis Date Noted  . No Resolved Ambulatory Problems   Past Medical History:  Diagnosis Date  . Chronic back pain   . COPD (chronic obstructive pulmonary disease) ([place])   . Depression   . Diabetes mellitus without complication ([place])   . Hyperlipidemia   . Hypertension   . Renal cell carcinoma ([place])   . Sleep apnea    Assessment  Primary Diagnosis & Pertinent Problem List: The primary encounter diagnosis was Bilateral stenosis of lateral recess of lumbar spine (L3-L4). Diagnoses of Lumbar degenerative disc disease, Ankylosis of lumbar spine, Neuroforaminal stenosis of lumbar  spine (left, L5/S1), Spinal stenosis, lumbar region, with neurogenic claudication, Lumbar facet arthropathy, Chronic pain syndrome, H/O deep venous thrombosis (ON ELIQUIS), Chronic kidney disease, unspecified CKD stage, Chronic obstructive pulmonary disease, unspecified COPD type ([place]), History of pulmonary embolus (PE) (previous IVC filter, now removed), and Lumbar radiculopathy were also pertinent to this visit.  Visit Diagnosis (New problems to examiner): 1. Bilateral stenosis of lateral recess of lumbar spine (L3-L4)   2. Lumbar degenerative disc disease   3. Ankylosis of lumbar spine   4. Neuroforaminal stenosis of lumbar spine (left, L5/S1)   5. Spinal stenosis, lumbar region, with neurogenic claudication   6. Lumbar facet arthropathy   7. Chronic pain syndrome   8. H/O deep venous thrombosis (ON ELIQUIS)   9. Chronic kidney disease, unspecified CKD stage   10. Chronic obstructive pulmonary disease, unspecified COPD type ([place])   11. History of pulmonary embolus (PE) (previous IVC filter, now removed)   12. Lumbar radiculopathy    Patient is a pleasant 73 year old male with a chief complaint of low back pain with radiation into bilateral lower extremities in a dermatomal fashion.  Patient also endorses heaviness and pain in his legs making it difficult for him to walk.  Patient has a history of varicose veins and associated lymphedema status post bilateral laser ablation with vascular surgery that helped somewhat.  Patient also has a history of DVT and pulmonary embolus.  He is currently on Eliquis daily.  This is managed by Dr. Holley Raring with nephrology.  Patient previously did have an IVC filter in place but that was removed.  Patient states that his quality of life is very poor.  He has very limited functional status given his pain, weakness, discomfort.  Patient has to walk while maintaining forward flexion.  He is unable to stand up erect.  He states that his balance is also affected.   Patient did experience a fall earlier this year requiring him to go to the ED.  Patient also endorses numbness and tingling in his lower extremities.  Patient has been evaluated by Dr. Rudene Christians, lumbar MRI was ordered.  Results of which are below but shows lumbar spinal stenosis, lumbar degenerative disc disease, lumbar neuroforaminal stenosis.  Patient has tried physical therapy without much benefit.  He is status post bilateral L4-L5 transforaminal epidural steroid injections with Dr. Santa Lighter on 12/22/2018 which he states was not beneficial.  He is also been evaluated by Dr. Lacinda Axon with neurosurgery and surgery was advised against.  Patient's thoracic MRI is unremarkable for canal or foraminal stenosis.  I had an extensive discussion with the patient regarding treatment plan.  We talked a great deal about the radical lumbar spinal cord stimulation, including risks/benefits and technical aspects of procedure.  Rather than jumping to spinal cord stimulation, I recommend that we pursue repeat epidural steroid injection utilizing the interlaminar approach rather than the transforaminal approach.  The patient has multilevel lateral recess and neuroforaminal stenosis contributing to his chronic lumbar radicular pain.  An interlaminar approach would allow multiple levels to be covered which could provide greater opportunity for pain relief.  Patient is on Eliquis given his history of DVT and prior PE.  Patient states that this is managed by his nephrologist, Dr. Holley Raring.  We will contact Dr. Holley Raring with nephrology for cardiac clearance.  If patient is able to safely stop his Eliquis for 3 days, we can get him scheduled for interlaminar epidural steroid injection at L4-L5.  In regards to the radical lumbar spinal cord stimulation, patient's thoracic MRI is unremarkable further improving his candidacy for spinal cord stimulator trial.  However I do have reservations about the patient being off his Eliquis for a total of 10 days  (3 days before trial and 7 days while trial leads are in place).  I informed the patient that this does increase his risk of DVT and PE especially in the context of him previously having DVT and PE.  If we do get to this point, this will be a discussion that Dr. Holley Raring in the patient and myself will have regarding risk-benefit analysis.  Patient endorsed understanding.  He will also need psychological evaluation if we do proceed with SCS trial.  Continue MM with PCP for time being. We will focus on interventional pain therapies.   Plan of Care (Initial workup plan)  Note: Mr. Stene was reminded that as per protocol, today's visit has been an evaluation only. We have not taken over the patient's controlled substance management.  Will contact Dr Anthonette Legato ([place] [phone]/ [phone]) to get clearance to stop Eliquis 3 days prior to scheduled procedure. If/when clearance obtained, please contact patient and have him stop Eliquis 3 days prior to his procedure date (whenever that is after Covid 19 restrictions lifted)  Future considerations- possible SCS however will need to discuss risk of being off Eliquis for 10 days if patient wants to pursue SCS   Procedure Orders     Lumbar Epidural Injection  Pharmacological management options:  Opioid Analgesics: The patient was informed that there is no guarantee that he would be a candidate for opioid analgesics. The decision will be made following CDC guidelines. This decision will be based on the results of diagnostic studies, as well as Mr. Krienke's risk profile.   Membrane stabilizer: To be determined at a later time  Muscle relaxant: To be determined at a later time  NSAID: To be determined at a later time  Other analgesic(s): To be determined at a later time   Interventional management options: Mr. Mayall was informed that there is no guarantee that he would be a candidate for interventional therapies.  The decision will be based on the results of diagnostic studies, as well as Mr. Plaia's risk profile.  Procedure(s) under consideration:  Lumbar interlaminar ESI Spinal cord stimulator trial Diagnostic lumbar facet blocks SI J block   Provider-requested follow-up: Return for Procedure, After COVID-19 restrictions lifted, Blood Thinner Protocol.  Future Appointments  Date Time Provider [place]  05/20/2019  1:00 PM [facility] None  05/20/2019  1:15 PM Lloyd Huger, MD [facility] None  05/20/2019  1:45 PM [facility] INFUSION CHAIR 8 [facility] None    Total duration  of non-face-to-face encounter: 45 minutes.  Primary Care Physician: Derinda Late, MD Location: [facility] Note by: Curtis Santa, MD Date: 05/11/2019; Time: 2:05 PM  Note: This dictation was prepared with Dragon dictation. Any transcriptional errors that may result from this process are unintentional.

## 2019-05-11 ENCOUNTER — Encounter: Payer: Self-pay | Admitting: Student in an Organized Health Care Education/Training Program

## 2019-05-11 ENCOUNTER — Ambulatory Visit
Payer: Medicare Other | Attending: Student in an Organized Health Care Education/Training Program | Admitting: Student in an Organized Health Care Education/Training Program

## 2019-05-11 ENCOUNTER — Other Ambulatory Visit: Payer: Self-pay

## 2019-05-11 DIAGNOSIS — M48061 Spinal stenosis, lumbar region without neurogenic claudication: Secondary | ICD-10-CM | POA: Diagnosis not present

## 2019-05-11 DIAGNOSIS — Z86718 Personal history of other venous thrombosis and embolism: Secondary | ICD-10-CM

## 2019-05-11 DIAGNOSIS — M5416 Radiculopathy, lumbar region: Secondary | ICD-10-CM

## 2019-05-11 DIAGNOSIS — M9983 Other biomechanical lesions of lumbar region: Secondary | ICD-10-CM

## 2019-05-11 DIAGNOSIS — M4326 Fusion of spine, lumbar region: Secondary | ICD-10-CM | POA: Diagnosis not present

## 2019-05-11 DIAGNOSIS — G894 Chronic pain syndrome: Secondary | ICD-10-CM

## 2019-05-11 DIAGNOSIS — M5136 Other intervertebral disc degeneration, lumbar region: Secondary | ICD-10-CM | POA: Diagnosis not present

## 2019-05-11 DIAGNOSIS — N189 Chronic kidney disease, unspecified: Secondary | ICD-10-CM

## 2019-05-11 DIAGNOSIS — M47816 Spondylosis without myelopathy or radiculopathy, lumbar region: Secondary | ICD-10-CM | POA: Insufficient documentation

## 2019-05-11 DIAGNOSIS — Z86711 Personal history of pulmonary embolism: Secondary | ICD-10-CM

## 2019-05-11 DIAGNOSIS — M48062 Spinal stenosis, lumbar region with neurogenic claudication: Secondary | ICD-10-CM | POA: Insufficient documentation

## 2019-05-11 DIAGNOSIS — J449 Chronic obstructive pulmonary disease, unspecified: Secondary | ICD-10-CM

## 2019-05-13 NOTE — Progress Notes (Signed)
[place]  [phone] [phone]  ID: Bjorn Hallas OB: [birth year]  MR#: [id number]  RKY#:[id number]  Patient Care Team: Derinda Late, MD as PCP - General (Family Medicine)  I connected with Venetia Constable on 05/21/19 at  1:15 PM EDT by video enabled telemedicine visit and verified that I am speaking with the correct person using two identifiers.   I discussed the limitations, risks, security and privacy concerns of performing an evaluation and management service by telemedicine and the availability of in-person appointments. I also discussed with the patient that there may be a patient responsible charge related to this service. The patient expressed understanding and agreed to proceed.   Other persons participating in the visit and their role in the encounter: Patient, MD  Patient's location: Home Provider's location: Clinic  CHIEF COMPLAINT: Anemia in chronic kidney disease, mild iron deficiency.  INTERVAL HISTORY: Patient agreed to video enabled telemedicine visit to discuss his laboratory results and further evaluation.  He continues to have significant back pain and all interventions have been postponed secondary to be COVID-19 pandemic, but patient reports he is returning to his orthopedic surgeon in the near future for reevaluation.  He has noticed increasing weakness and fatigue, but otherwise feels well.  He has no neurologic complaints.  He denies any chest pain, shortness of breath, cough, or hemoptysis.  He denies any abdominal pain.  He has no nausea, vomiting, constipation, or diarrhea.  He denies any melena or hematochezia.  He has no urinary complaints.  Patient otherwise feels well and offers no further specific complaints today.  REVIEW OF SYSTEMS:   Review of Systems  Constitutional: Positive for malaise/fatigue. Negative for fever and weight loss.  Respiratory: Negative.  Negative for cough, hemoptysis and  shortness of breath.   Cardiovascular: Negative.  Negative for chest pain and leg swelling.  Gastrointestinal: Negative.  Negative for abdominal pain and constipation.  Genitourinary: Negative.  Negative for hematuria.  Musculoskeletal: Positive for back pain.  Skin: Negative.  Negative for rash.  Neurological: Positive for weakness. Negative for dizziness, sensory change, focal weakness and headaches.  Psychiatric/Behavioral: Negative.  The patient is not nervous/anxious.     As per HPI. Otherwise, a complete review of systems is negative.  PAST MEDICAL HISTORY: Past Medical History:  Diagnosis Date  . Chronic back pain   . COPD (chronic obstructive pulmonary disease) ([place])   . Depression   . Diabetes mellitus without complication ([place])   . Hyperlipidemia   . Hypertension   . Renal cell carcinoma ([place])   . Sleep apnea     PAST SURGICAL HISTORY: Past Surgical History:  Procedure Laterality Date  . CARPAL TUNNEL RELEASE    . CHOLECYSTECTOMY    . COLONOSCOPY WITH PROPOFOL N/A 07/29/2017   Procedure: COLONOSCOPY WITH PROPOFOL;  Surgeon: Manya Silvas, MD;  Location: [facility] ENDOSCOPY;  Service: Endoscopy;  Laterality: N/A;  . colonoscopys    . ESOPHAGOGASTRODUODENOSCOPY (EGD) WITH PROPOFOL N/A 07/29/2017   Procedure: ESOPHAGOGASTRODUODENOSCOPY (EGD) WITH PROPOFOL;  Surgeon: Manya Silvas, MD;  Location: [facility] ENDOSCOPY;  Service: Endoscopy;  Laterality: N/A;  . FLEXIBLE SIGMOIDOSCOPY    . GASTRIC BYPASS    . NEPHRECTOMY    . open reduction and internal fixation, right distal radius    . right knee arthroscopy    . UPPER GI ENDOSCOPY      FAMILY HISTORY: Family History  Problem Relation Age of Onset  . Diabetes Mother   . Cancer Mother   .  Heart disease Father     ADVANCED DIRECTIVES (Y/N):  N  HEALTH MAINTENANCE: Social History   Tobacco Use  . Smoking status: Former Smoker    Packs/day: 1.00    Years: 30.00    Pack years: 30.00  . Smokeless tobacco:  Current User    Types: Chew  Substance Use Topics  . Alcohol use: No    Frequency: Never  . Drug use: No     Colonoscopy:  PAP:  Bone density:  Lipid panel:  No Known Allergies  Current Outpatient Medications  Medication Sig Dispense Refill  . Apixaban (ELIQUIS PO) Take 5 mg by mouth daily.     . carvedilol (COREG) 6.25 MG tablet Take 6.25 mg by mouth 2 (two) times daily with a meal.    . enalapril (VASOTEC) 20 MG tablet Take 20 mg by mouth daily.    . furosemide (LASIX) 40 MG tablet Take 40 mg by mouth 2 (two) times daily.     Marland Kitchen gabapentin (NEURONTIN) 300 MG capsule Take 300 mg by mouth at bedtime.     Marland Kitchen glipiZIDE (GLUCOTROL) 5 MG tablet Take 10 mg by mouth daily before breakfast.     . metolazone (ZAROXOLYN) 2.5 MG tablet     . Multiple Vitamin (MULTIVITAMIN) tablet Take 1 tablet by mouth daily.    Marland Kitchen oxyCODONE-acetaminophen (PERCOCET) 5-325 MG tablet Take 1 tablet by mouth every 8 (eight) hours as needed. 20 tablet 0  . pantoprazole (PROTONIX) 40 MG tablet Take 40 mg by mouth daily.    Marland Kitchen PARoxetine (PAXIL) 40 MG tablet Take 40 mg by mouth every morning.     . pramipexole (MIRAPEX) 1 MG tablet Take by mouth.    . spironolactone (ALDACTONE) 25 MG tablet Take 25 mg by mouth daily.     . sucralfate (CARAFATE) 1 g tablet Take 1 g by mouth 4 (four) times daily -  with meals and at bedtime.    . traMADol (ULTRAM) 50 MG tablet Take 50 mg by mouth every 6 (six) hours as needed.    . potassium chloride SA (K-DUR,KLOR-CON) 20 MEQ tablet Take 20 mEq by mouth daily.      No current facility-administered medications for this visit.     OBJECTIVE: There were no vitals filed for this visit.   There is no height or weight on file to calculate BMI.    ECOG FS:0 - Asymptomatic  General: Well-developed, well-nourished, no acute distress. HEENT: Normocephalic. Skin: No rashes or petechiae noted. Neuro: Alert, answering all questions appropriately.  Cranial nerves grossly intact. Psych:  Normal affect.   LAB RESULTS:  Lab Results  Component Value Date   NA 140 01/24/2019   K 5.6 (H) 01/24/2019   CL 113 (H) 01/24/2019   CO2 23 01/24/2019   GLUCOSE 209 (H) 01/24/2019   BUN 41 (H) 01/24/2019   CREATININE 1.66 (H) 01/24/2019   CALCIUM 8.9 01/24/2019   PROT 6.9 06/22/2014   ALBUMIN 3.2 (L) 06/22/2014   AST 10 (L) 06/22/2014   ALT 18 06/22/2014   ALKPHOS 104 06/22/2014   BILITOT 0.4 06/22/2014   GFRNONAA 41 (L) 01/24/2019   GFRAA 48 (L) 01/24/2019    Lab Results  Component Value Date   WBC 4.7 05/19/2019   NEUTROABS 3.4 05/19/2019   HGB 9.5 (L) 05/19/2019   HCT 31.9 (L) 05/19/2019   MCV 95.8 05/19/2019   PLT 176 05/19/2019   Lab Results  Component Value Date   IRON 68 05/19/2019  TIBC 344 05/19/2019   IRONPCTSAT 20 05/19/2019   Lab Results  Component Value Date   FERRITIN 23 (L) 05/19/2019     STUDIES: No results found.  ASSESSMENT: Anemia in chronic kidney disease, mild iron deficiency.  PLAN:    1. Anemia in chronic kidney disease, mild iron deficiency: Patient's hemoglobin is trending down and he is symptomatic.  His iron stores have trended up, therefore he does not require IV Feraheme today.  Given his chronic renal insufficiency, patient will benefit from Procrit and will return to clinic early next week to receive 40,000 units subcutaneous Procrit.  He would then return to clinic in 6 weeks for repeat laboratory work, further evaluation, and consideration of additional of treatment.  2.  Chronic renal insufficiency: Chronic and unchanged.  Continue treatment and monitoring per nephrology. 3.  Vascular insufficiency: Continue follow-up with vascular surgery as scheduled. 4.  Back pain: Continue follow-up and treatment per orthopedics.    I provided 25 minutes of face-to-face video visit time during this encounter, and > 50% was spent counseling as documented under my assessment & plan.    Patient expressed understanding and was in  agreement with this plan. He also understands that He can call clinic at any time with any questions, concerns, or complaints.    Lloyd Huger, MD   05/21/2019 11:43 AM

## 2019-05-19 ENCOUNTER — Inpatient Hospital Stay: Payer: Medicare Other | Attending: Oncology

## 2019-05-19 ENCOUNTER — Other Ambulatory Visit: Payer: Self-pay

## 2019-05-19 DIAGNOSIS — I998 Other disorder of circulatory system: Secondary | ICD-10-CM | POA: Diagnosis not present

## 2019-05-19 DIAGNOSIS — N189 Chronic kidney disease, unspecified: Secondary | ICD-10-CM | POA: Diagnosis not present

## 2019-05-19 DIAGNOSIS — M549 Dorsalgia, unspecified: Secondary | ICD-10-CM | POA: Insufficient documentation

## 2019-05-19 DIAGNOSIS — D649 Anemia, unspecified: Secondary | ICD-10-CM

## 2019-05-19 DIAGNOSIS — D631 Anemia in chronic kidney disease: Secondary | ICD-10-CM | POA: Insufficient documentation

## 2019-05-19 LAB — CBC WITH DIFFERENTIAL/PLATELET
Abs Immature Granulocytes: 0.02 10*3/uL (ref 0.00–0.07)
Basophils Absolute: 0 10*3/uL (ref 0.0–0.1)
Basophils Relative: 1 %
Eosinophils Absolute: 0.1 10*3/uL (ref 0.0–0.5)
Eosinophils Relative: 3 %
HCT: 31.9 % — ABNORMAL LOW (ref 39.0–52.0)
Hemoglobin: 9.5 g/dL — ABNORMAL LOW (ref 13.0–17.0)
Immature Granulocytes: 0 %
Lymphocytes Relative: 17 %
Lymphs Abs: 0.8 10*3/uL (ref 0.7–4.0)
MCH: 28.5 pg (ref 26.0–34.0)
MCHC: 29.8 g/dL — ABNORMAL LOW (ref 30.0–36.0)
MCV: 95.8 fL (ref 80.0–100.0)
Monocytes Absolute: 0.3 10*3/uL (ref 0.1–1.0)
Monocytes Relative: 6 %
Neutro Abs: 3.4 10*3/uL (ref 1.7–7.7)
Neutrophils Relative %: 73 %
Platelets: 176 10*3/uL (ref 150–400)
RBC: 3.33 MIL/uL — ABNORMAL LOW (ref 4.22–5.81)
RDW: 16.5 % — ABNORMAL HIGH (ref 11.5–15.5)
WBC: 4.7 10*3/uL (ref 4.0–10.5)
nRBC: 0 % (ref 0.0–0.2)

## 2019-05-19 LAB — IRON AND TIBC
Iron: 68 ug/dL (ref 45–182)
Saturation Ratios: 20 % (ref 17.9–39.5)
TIBC: 344 ug/dL (ref 250–450)
UIBC: 276 ug/dL

## 2019-05-19 LAB — FERRITIN: Ferritin: 23 ng/mL — ABNORMAL LOW (ref 24–336)

## 2019-05-20 ENCOUNTER — Inpatient Hospital Stay (HOSPITAL_BASED_OUTPATIENT_CLINIC_OR_DEPARTMENT_OTHER): Payer: Medicare Other | Admitting: Oncology

## 2019-05-20 ENCOUNTER — Inpatient Hospital Stay: Payer: Medicare Other

## 2019-05-20 ENCOUNTER — Encounter: Payer: Self-pay | Admitting: Oncology

## 2019-05-20 ENCOUNTER — Other Ambulatory Visit: Payer: Medicare Other

## 2019-05-20 ENCOUNTER — Other Ambulatory Visit: Payer: Self-pay

## 2019-05-20 DIAGNOSIS — D631 Anemia in chronic kidney disease: Secondary | ICD-10-CM

## 2019-05-20 DIAGNOSIS — N189 Chronic kidney disease, unspecified: Secondary | ICD-10-CM | POA: Diagnosis not present

## 2019-05-20 DIAGNOSIS — D509 Iron deficiency anemia, unspecified: Secondary | ICD-10-CM

## 2019-05-20 NOTE — Progress Notes (Signed)
Patient stated that he had been feeling weak and SOB on exertion. Patient denied fever, chills, nausea, vomiting, constipation, diarrhea or blood loss.

## 2019-05-23 ENCOUNTER — Inpatient Hospital Stay: Payer: Medicare Other

## 2019-05-23 ENCOUNTER — Other Ambulatory Visit: Payer: Self-pay

## 2019-05-23 VITALS — BP 110/61 | HR 75

## 2019-05-23 DIAGNOSIS — D509 Iron deficiency anemia, unspecified: Secondary | ICD-10-CM

## 2019-05-23 DIAGNOSIS — N189 Chronic kidney disease, unspecified: Secondary | ICD-10-CM | POA: Diagnosis not present

## 2019-05-23 MED ORDER — EPOETIN ALFA 40000 UNIT/ML IJ SOLN
40000.0000 [IU] | Freq: Once | INTRAMUSCULAR | Status: AC
  Administered 2019-05-23: 40000 [IU] via SUBCUTANEOUS

## 2019-05-23 NOTE — Progress Notes (Signed)
Using bloodwork from 05/19/2019

## 2019-05-30 ENCOUNTER — Telehealth: Payer: Self-pay | Admitting: *Deleted

## 2019-05-30 NOTE — Telephone Encounter (Signed)
Called and spoke with patient and stated that he would need to stop his eliquis three days before for appt for his procedure.

## 2019-05-31 NOTE — Telephone Encounter (Signed)
Notified patient that he has permission from Dr Holley Raring to stop his Eliquis for 3 days prior to the procedure.

## 2019-06-03 ENCOUNTER — Other Ambulatory Visit: Payer: Self-pay

## 2019-06-03 ENCOUNTER — Other Ambulatory Visit
Admission: RE | Admit: 2019-06-03 | Discharge: 2019-06-03 | Disposition: A | Discharge status: Home / Self Care | Payer: Medicare Other | Source: Ambulatory Visit | Attending: Student in an Organized Health Care Education/Training Program | Admitting: Student in an Organized Health Care Education/Training Program

## 2019-06-03 DIAGNOSIS — Z1159 Encounter for screening for other viral diseases: Secondary | ICD-10-CM | POA: Insufficient documentation

## 2019-06-04 LAB — NOVEL CORONAVIRUS, NAA (HOSP ORDER, SEND-OUT TO REF LAB; TAT 18-24 HRS): SARS-CoV-2, NAA: NOT DETECTED

## 2019-06-08 ENCOUNTER — Other Ambulatory Visit
Admission: RE | Admit: 2019-06-08 | Discharge: 2019-06-08 | Disposition: A | Discharge status: Home / Self Care | Payer: Medicare Other | Source: Ambulatory Visit | Attending: Nephrology | Admitting: Nephrology

## 2019-06-08 ENCOUNTER — Ambulatory Visit
Admission: RE | Admit: 2019-06-08 | Discharge: 2019-06-08 | Disposition: A | Discharge status: Home / Self Care | Payer: Medicare Other | Source: Ambulatory Visit | Attending: Student in an Organized Health Care Education/Training Program | Admitting: Student in an Organized Health Care Education/Training Program

## 2019-06-08 ENCOUNTER — Other Ambulatory Visit: Payer: Self-pay

## 2019-06-08 ENCOUNTER — Ambulatory Visit (HOSPITAL_BASED_OUTPATIENT_CLINIC_OR_DEPARTMENT_OTHER): Payer: Medicare Other | Admitting: Student in an Organized Health Care Education/Training Program

## 2019-06-08 ENCOUNTER — Encounter: Payer: Self-pay | Admitting: Student in an Organized Health Care Education/Training Program

## 2019-06-08 VITALS — BP 110/68 | HR 68 | Temp 97.7°F | Resp 19 | Ht 72.0 in | Wt 280.0 lb

## 2019-06-08 DIAGNOSIS — G894 Chronic pain syndrome: Secondary | ICD-10-CM | POA: Diagnosis not present

## 2019-06-08 DIAGNOSIS — M5416 Radiculopathy, lumbar region: Secondary | ICD-10-CM

## 2019-06-08 LAB — URINE DRUG SCREEN, QUALITATIVE (ARMC ONLY)
Amphetamines, Ur Screen: NOT DETECTED
Barbiturates, Ur Screen: NOT DETECTED
Benzodiazepine, Ur Scrn: NOT DETECTED
Cannabinoid 50 Ng, Ur ~~LOC~~: NOT DETECTED
Cocaine Metabolite,Ur ~~LOC~~: NOT DETECTED
MDMA (Ecstasy)Ur Screen: NOT DETECTED
Methadone Scn, Ur: NOT DETECTED
Opiate, Ur Screen: NOT DETECTED
Phencyclidine (PCP) Ur S: NOT DETECTED
Tricyclic, Ur Screen: NOT DETECTED

## 2019-06-08 MED ORDER — TIZANIDINE HCL 2 MG PO TABS: 2.0000 mg | ORAL_TABLET | Freq: Three times a day (TID) | ORAL | 1 refills | Status: AC | PRN

## 2019-06-08 MED ORDER — ROPIVACAINE HCL 2 MG/ML IJ SOLN
INTRAMUSCULAR | Status: AC
  Filled 2019-06-08: qty 10

## 2019-06-08 MED ORDER — ROPIVACAINE HCL 2 MG/ML IJ SOLN
2.0000 mL | Freq: Once | INTRAMUSCULAR | Status: AC
  Administered 2019-06-08: 2 mL via EPIDURAL

## 2019-06-08 MED ORDER — LIDOCAINE HCL 2 % IJ SOLN
INTRAMUSCULAR | Status: AC
  Filled 2019-06-08: qty 20

## 2019-06-08 MED ORDER — LIDOCAINE HCL 2 % IJ SOLN
20.0000 mL | Freq: Once | INTRAMUSCULAR | Status: AC
  Administered 2019-06-08: 400 mg

## 2019-06-08 MED ORDER — DEXAMETHASONE SODIUM PHOSPHATE 10 MG/ML IJ SOLN
10.0000 mg | Freq: Once | INTRAMUSCULAR | Status: AC
  Administered 2019-06-08: 10 mg

## 2019-06-08 MED ORDER — IOHEXOL 180 MG/ML  SOLN
10.0000 mL | Freq: Once | INTRAMUSCULAR | Status: AC
  Administered 2019-06-08: 10 mL via EPIDURAL

## 2019-06-08 MED ORDER — SODIUM CHLORIDE (PF) 0.9 % IJ SOLN
INTRAMUSCULAR | Status: AC
  Filled 2019-06-08: qty 10

## 2019-06-08 MED ORDER — DEXAMETHASONE SODIUM PHOSPHATE 10 MG/ML IJ SOLN
INTRAMUSCULAR | Status: AC
  Filled 2019-06-08: qty 1

## 2019-06-08 MED ORDER — SODIUM CHLORIDE 0.9% FLUSH
2.0000 mL | Freq: Once | INTRAVENOUS | Status: AC
  Administered 2019-06-08: 2 mL

## 2019-06-08 NOTE — Progress Notes (Signed)
Safety precautions to be maintained throughout the outpatient stay will include: orient to surroundings, keep bed in low position, maintain call bell within reach at all times, provide assistance with transfer out of bed and ambulation.  

## 2019-06-08 NOTE — Patient Instructions (Signed)
Okay to restart Eliquis tomorrow so long as no new lower extremity weakness

## 2019-06-08 NOTE — Progress Notes (Signed)
Patient's Name: [name]  MRN: [id number]  Referring Provider: Gillis Santa, MD  DOB: [birth year]  PCP: Derinda Late, MD  DOS: 06/08/2019  Note by: Gillis Santa, MD  Service setting: Ambulatory outpatient  Specialty: Interventional Pain Management  Patient type: Established  Location: [facility] (AMB) Pain Management Facility  Visit type: Interventional Procedure   Primary Reason for Visit: Interventional Pain Management Treatment. CC: Back Pain (low )  Procedure:          Anesthesia, Analgesia, Anxiolysis:  Type: Diagnostic Inter-Laminar Epidural Steroid Injection  #1  Region: Lumbar Level: L5-S1 Level. Laterality: Midline         Type: Local Anesthesia  Local Anesthetic: Lidocaine 1-2%  Position: Prone with head of the table was raised to facilitate breathing.   Indications: 1. Chronic pain syndrome   2. Lumbar radiculopathy    Pain Score: Pre-procedure: 0-No pain/10 Post-procedure: 0-No pain/10  Patient received cardiac clearance by Dr. Holley Raring with nephrology to stop Eliquis 3 days prior to scheduled procedure however patient only stopped it for 2 days. Risks discussed.   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  Patient has tried and failed bilateral transforaminal steroid injections we will attempt interlaminar ESI today.  Consider SCS in future.  Pre-op Assessment:  Mr. Holcomb is a 73 y.o. (year old), male patient, seen today for interventional treatment. He  has a past surgical history that includes colonoscopys; Upper gi endoscopy; Flexible sigmoidoscopy; Gastric bypass; Nephrectomy; open reduction and internal fixation, right distal radius; Carpal tunnel release; right knee arthroscopy; Esophagogastroduodenoscopy (egd) with propofol (N/A, 07/29/2017); Colonoscopy with propofol (N/A, 07/29/2017); and Cholecystectomy. Mr. Keadle has a current medication list which includes the following prescription(s): anoro ellipta, apixaban, carvedilol,  enalapril, furosemide, gabapentin, glipizide, metolazone, multivitamin, oxycodone-acetaminophen, pantoprazole, paroxetine, pramipexole, sucralfate, tramadol, potassium chloride sa, spironolactone, and tizanidine. His primarily concern today is the Back Pain (low )  Initial Vital Signs:  Pulse/HCG Rate: 68ECG Heart Rate: 73 Temp: 97.7 F (36.5 C) Resp: 18 BP: 105/66 SpO2: 97 %  BMI: Estimated body mass index is 37.97 kg/m as calculated from the following:   Height as of this encounter: 6' (1.829 m).   Weight as of this encounter: 280 lb (127 kg).  Risk Assessment: Allergies: Reviewed. He has No Known Allergies.  Allergy Precautions: None required Coagulopathies: Reviewed. None identified.  Blood-thinner therapy: None at this time Active Infection(s): Reviewed. None identified. Mr. Rutledge is afebrile  Site Confirmation: Mr. Ottley was asked to confirm the procedure and laterality before marking the site Procedure checklist: Completed Consent: Before the procedure and under the influence of no sedative(s), amnesic(s), or anxiolytics, the patient was informed of the treatment options, risks and possible complications. To fulfill our ethical and legal obligations, as recommended by the American Medical Association's Code of Ethics, I have informed the patient of my clinical impression; the nature and purpose of the treatment or procedure; the risks, benefits, and possible complications of the intervention; the alternatives, including doing nothing; the risk(s) and benefit(s) of the alternative treatment(s) or procedure(s); and the risk(s) and benefit(s) of doing nothing. The patient was provided information about the general risks and possible complications associated with the procedure. These may include, but are not limited to: failure to achieve desired goals, infection, bleeding, organ or nerve damage, allergic reactions, paralysis, and death. In addition, the patient was informed of  those risks and complications associated to Spine-related procedures, such as failure to decrease pain; infection (i.e.: Meningitis, epidural or intraspinal abscess);  bleeding (i.e.: epidural hematoma, subarachnoid hemorrhage, or any other type of intraspinal or peri-dural bleeding); organ or nerve damage (i.e.: Any type of peripheral nerve, nerve root, or spinal cord injury) with subsequent damage to sensory, motor, and/or autonomic systems, resulting in permanent pain, numbness, and/or weakness of one or several areas of the body; allergic reactions; (i.e.: anaphylactic reaction); and/or death. Furthermore, the patient was informed of those risks and complications associated with the medications. These include, but are not limited to: allergic reactions (i.e.: anaphylactic or anaphylactoid reaction(s)); adrenal axis suppression; blood sugar elevation that in diabetics may result in ketoacidosis or comma; water retention that in patients with history of congestive heart failure may result in shortness of breath, pulmonary edema, and decompensation with resultant heart failure; weight gain; swelling or edema; medication-induced neural toxicity; particulate matter embolism and blood vessel occlusion with resultant organ, and/or nervous system infarction; and/or aseptic necrosis of one or more joints. Finally, the patient was informed that Medicine is not an exact science; therefore, there is also the possibility of unforeseen or unpredictable risks and/or possible complications that may result in a catastrophic outcome. The patient indicated having understood very clearly. We have given the patient no guarantees and we have made no promises. Enough time was given to the patient to ask questions, all of which were answered to the patient's satisfaction. Mr. Wanke has indicated that he wanted to continue with the procedure. Attestation: I, the ordering provider, attest that I have discussed with the patient the  benefits, risks, side-effects, alternatives, likelihood of achieving goals, and potential problems during recovery for the procedure that I have provided informed consent. Date  Time: 06/08/2019  8:53 AM  Pre-Procedure Preparation:  Monitoring: As per clinic protocol. Respiration, ETCO2, SpO2, BP, heart rate and rhythm monitor placed and checked for adequate function Safety Precautions: Patient was assessed for positional comfort and pressure points before starting the procedure. Time-out: I initiated and conducted the "Time-out" before starting the procedure, as per protocol. The patient was asked to participate by confirming the accuracy of the "Time Out" information. Verification of the correct person, site, and procedure were performed and confirmed by me, the nursing staff, and the patient. "Time-out" conducted as per Joint Commission's Universal Protocol (UP.01.01.01). Time: 0928  Description of Procedure:          Target Area: The interlaminar space, initially targeting the lower laminar border of the superior vertebral body. Approach: Paramedial approach. Area Prepped: Entire Posterior Lumbar Region Prepping solution: DuraPrep (Iodine Povacrylex [0.7% available iodine] and Isopropyl Alcohol, 74% w/w) Safety Precautions: Aspiration looking for blood return was conducted prior to all injections. At no point did we inject any substances, as a needle was being advanced. No attempts were made at seeking any paresthesias. Safe injection practices and needle disposal techniques used. Medications properly checked for expiration dates. SDV (single dose vial) medications used. Description of the Procedure: Protocol guidelines were followed. The procedure needle was introduced through the skin, ipsilateral to the reported pain, and advanced to the target area. Bone was contacted and the needle walked caudad, until the lamina was cleared. The epidural space was identified using "loss-of-resistance  technique" with 2-3 ml of PF-NaCl (0.9% NSS), in a 5cc LOR glass syringe.  Vitals:   06/08/19 0925 06/08/19 0930 06/08/19 0935 06/08/19 0940  BP: 115/66 120/64 108/63 110/68  Pulse:      Resp: 19 (!) 22 (!) 21 19  Temp:      SpO2: 97% 97% 97% 97%  Weight:      Height:        Start Time: 0928 hrs. End Time: 0938 hrs.  Materials:  Needle(s) Type: Epidural needle Gauge: 17G Length: 3.5-in Medication(s): Please see orders for medications and dosing details. 7 cc solution consisting of 4 cc of normal saline, 2 cc of 0.2% ropivacaine, 1 cc of Decadron, 1 cc  Imaging Guidance (Spinal):          Type of Imaging Technique: Fluoroscopy Guidance (Spinal) Indication(s): Assistance in needle guidance and placement for procedures requiring needle placement in or near specific anatomical locations not easily accessible without such assistance. Exposure Time: Please see nurses notes. Contrast: Before injecting any contrast, we confirmed that the patient did not have an allergy to iodine, shellfish, or radiological contrast. Once satisfactory needle placement was completed at the desired level, radiological contrast was injected. Contrast injected under live fluoroscopy. No contrast complications. See chart for type and volume of contrast used. Fluoroscopic Guidance: I was personally present during the use of fluoroscopy. "Tunnel Vision Technique" used to obtain the best possible view of the target area. Parallax error corrected before commencing the procedure. "Direction-depth-direction" technique used to introduce the needle under continuous pulsed fluoroscopy. Once target was reached, antero-posterior, oblique, and lateral fluoroscopic projection used confirm needle placement in all planes. Images permanently stored in EMR. Interpretation: I personally interpreted the imaging intraoperatively. Adequate needle placement confirmed in multiple planes. Appropriate spread of contrast into desired area was  observed. No evidence of afferent or efferent intravascular uptake. No intrathecal or subarachnoid spread observed. Permanent images saved into the patient's record.  Antibiotic Prophylaxis:   Anti-infectives (From admission, onward)   None     Indication(s): None identified  Post-operative Assessment:  Post-procedure Vital Signs:  Pulse/HCG Rate: 6871 Temp: 97.7 F (36.5 C) Resp: 19 BP: 110/68 SpO2: 97 %  EBL: None  Complications: No immediate post-treatment complications observed by team, or reported by patient.  Note: The patient tolerated the entire procedure well. A repeat set of vitals were taken after the procedure and the patient was kept under observation following institutional policy, for this type of procedure. Post-procedural neurological assessment was performed, showing return to baseline, prior to discharge. The patient was provided with post-procedure discharge instructions, including a section on how to identify potential problems. Should any problems arise concerning this procedure, the patient was given instructions to immediately contact us, at any time, without hesitation. In any case, we plan to contact the patient by telephone for a follow-up status report regarding this interventional procedure.  Comments:  No additional relevant information.  Plan of Care  Patient instructed to restart Eliquis tomorrow so long as he is not having any lower extremity weakness and after nursing phone call to make sure he is doing okay.  Orders:  Orders Placed This Encounter  Procedures  . DG PAIN [facility] C-ARM 1-60 MIN NO REPORT    Intraoperative interpretation by procedural physician at [place].    Standing Status:   Standing    Number of Occurrences:   1    Order Specific Question:   Reason for exam:    Answer:   Assistance in needle guidance and placement for procedures requiring needle placement in or near specific anatomical locations not easily  accessible without such assistance.  . Compliance Drug Analysis, Ur    Volume: 30 ml(s). Minimum 3 ml of urine is needed. Document temperature of fresh sample. Indications: Long term (current) use of opiate analgesic (O67.672)  Test#: [id number] (Comprehensive Profile)   Medications ordered for procedure: Meds ordered this encounter  Medications  . iohexol (OMNIPAQUE) 180 MG/ML injection 10 mL    Must be Myelogram-compatible. If not available, you may substitute with a water-soluble, non-ionic, hypoallergenic, myelogram-compatible radiological contrast medium.  Marland Kitchen lidocaine (XYLOCAINE) 2 % (with pres) injection 400 mg  . sodium chloride flush (NS) 0.9 % injection 2 mL  . ropivacaine (PF) 2 mg/mL (0.2%) (NAROPIN) injection 2 mL  . dexamethasone (DECADRON) injection 10 mg  . tiZANidine (ZANAFLEX) 2 MG tablet    Sig: Take 1 tablet (2 mg total) by mouth every 8 (eight) hours as needed for muscle spasms.    Dispense:  60 tablet    Refill:  1    Do not place this medication, or any other prescription from our practice, on "Automatic Refill". Patient may have prescription filled one day early if pharmacy is closed on scheduled refill date.   Medications administered: We administered iohexol, lidocaine, sodium chloride flush, ropivacaine (PF) 2 mg/mL (0.2%), and dexamethasone.  See the medical record for exact dosing, route, and time of administration.  Disposition: Discharge home  Discharge Date & Time: 06/08/2019; 0946 hrs.   Follow-up plan:   Return in about 4 weeks (around 07/06/2019) for Post Procedure Evaluation, virtual.     Recent Visits Date Type Provider Dept  05/11/19 Office Visit Gillis Santa, MD [facility]  Showing recent visits within past 90 days and meeting all other requirements   Today's Visits Date Type Provider Dept  06/08/19 Procedure visit Gillis Santa, MD [facility]  Showing today's visits and meeting all other requirements   Future  Appointments Date Type Provider Dept  07/07/19 Appointment Gillis Santa, MD [facility]  Showing future appointments within next 90 days and meeting all other requirements   Primary Care Physician: Derinda Late, MD Location: [facility] Note by: Gillis Santa, MD Date: 06/08/2019; Time: 9:50 AM  Disclaimer:  Medicine is not an exact science. The only guarantee in medicine is that nothing is guaranteed. It is important to note that the decision to proceed with this intervention was based on the information collected from the patient. The Data and conclusions were drawn from the patient's questionnaire, the interview, and the physical examination. Because the information was provided in large part by the patient, it cannot be guaranteed that it has not been purposely or unconsciously manipulated. Every effort has been made to obtain as much relevant data as possible for this evaluation. It is important to note that the conclusions that lead to this procedure are derived in large part from the available data. Always take into account that the treatment will also be dependent on availability of resources and existing treatment guidelines, considered by other Pain Management Practitioners as being common knowledge and practice, at the time of the intervention. For Medico-Legal purposes, it is also important to point out that variation in procedural techniques and pharmacological choices are the acceptable norm. The indications, contraindications, technique, and results of the above procedure should only be interpreted and judged by a Board-Certified Interventional Pain Specialist with extensive familiarity and expertise in the same exact procedure and technique.

## 2019-06-09 ENCOUNTER — Telehealth: Payer: Self-pay | Admitting: *Deleted

## 2019-06-09 NOTE — Telephone Encounter (Signed)
Spoke with patient's wife, she reports no problems post procedure. °

## 2019-06-29 ENCOUNTER — Encounter: Payer: Self-pay | Admitting: Student in an Organized Health Care Education/Training Program

## 2019-06-29 ENCOUNTER — Telehealth: Payer: Self-pay | Admitting: Student in an Organized Health Care Education/Training Program

## 2019-06-29 NOTE — Telephone Encounter (Signed)
Thanks for letting us know. 

## 2019-06-29 NOTE — Telephone Encounter (Signed)
Patient states he has a Virtual Visit next week but he is in so much pain he can hardly walk without a walker. Cant wait until next week to speak to Dr. Holley Raring. I am changing appt to [facility] 06-30-2019

## 2019-06-30 ENCOUNTER — Other Ambulatory Visit: Payer: Self-pay

## 2019-06-30 ENCOUNTER — Encounter: Payer: Self-pay | Admitting: Student in an Organized Health Care Education/Training Program

## 2019-06-30 ENCOUNTER — Ambulatory Visit
Payer: Medicare Other | Attending: Student in an Organized Health Care Education/Training Program | Admitting: Student in an Organized Health Care Education/Training Program

## 2019-06-30 DIAGNOSIS — Z86711 Personal history of pulmonary embolism: Secondary | ICD-10-CM

## 2019-06-30 DIAGNOSIS — M5136 Other intervertebral disc degeneration, lumbar region: Secondary | ICD-10-CM

## 2019-06-30 DIAGNOSIS — M5416 Radiculopathy, lumbar region: Secondary | ICD-10-CM

## 2019-06-30 DIAGNOSIS — G894 Chronic pain syndrome: Secondary | ICD-10-CM

## 2019-06-30 DIAGNOSIS — N189 Chronic kidney disease, unspecified: Secondary | ICD-10-CM

## 2019-06-30 DIAGNOSIS — M48061 Spinal stenosis, lumbar region without neurogenic claudication: Secondary | ICD-10-CM

## 2019-06-30 DIAGNOSIS — M47816 Spondylosis without myelopathy or radiculopathy, lumbar region: Secondary | ICD-10-CM

## 2019-06-30 DIAGNOSIS — J449 Chronic obstructive pulmonary disease, unspecified: Secondary | ICD-10-CM

## 2019-06-30 DIAGNOSIS — M4326 Fusion of spine, lumbar region: Secondary | ICD-10-CM

## 2019-06-30 DIAGNOSIS — M9983 Other biomechanical lesions of lumbar region: Secondary | ICD-10-CM

## 2019-06-30 DIAGNOSIS — Z86718 Personal history of other venous thrombosis and embolism: Secondary | ICD-10-CM

## 2019-06-30 DIAGNOSIS — M48062 Spinal stenosis, lumbar region with neurogenic claudication: Secondary | ICD-10-CM

## 2019-06-30 NOTE — Progress Notes (Signed)
Pain Management Virtual Encounter Note - Virtual Visit via Telephone Telehealth (real-time audio visits between healthcare provider and patient).   Patient's Phone No. & Preferred Pharmacy:  [phone] (home); [phone] (mobile); (Preferred) [phone] No e-mail address on record  [id number] [name], [place] [address] [place] [address] Phone: [phone] Fax: [phone]    Pre-screening note:  Our staff contacted [name] and offered him an "in person", "face-to-face" appointment versus a telephone encounter. He indicated preferring the telephone encounter, at this time.   Reason for Virtual Visit: COVID-19*  Social distancing based on CDC and AMA recommendations.   I contacted [name] on 06/30/2019 via telephone.      I clearly identified myself as Gillis Santa, MD. I verified that I was speaking with the correct person using two identifiers (Name: [name], and date of birth: 07-Jun-1946).  Advanced Informed Consent I sought verbal advanced consent from [name] for virtual visit interactions. I informed [name] of possible security and privacy concerns, risks, and limitations associated with providing "not-in-person" medical evaluation and management services. I also informed [name] of the availability of "in-person" appointments. Finally, I informed him that there would be a charge for the virtual visit and that he could be  personally, fully or partially, financially responsible for it. [name] expressed understanding and agreed to proceed.   Historic Elements   [name] is a 73 y.o. year old, male patient evaluated today after his last encounter by our practice on 06/29/2019. [name]  has a past medical history of Chronic back pain, COPD (chronic obstructive pulmonary disease) ([place]), Depression, Diabetes mellitus without complication ([place]), Hyperlipidemia,  Hypertension, Renal cell carcinoma ([place]), and Sleep apnea. He also  has a past surgical history that includes colonoscopys; Upper gi endoscopy; Flexible sigmoidoscopy; Gastric bypass; Nephrectomy; open reduction and internal fixation, right distal radius; Carpal tunnel release; right knee arthroscopy; Esophagogastroduodenoscopy (egd) with propofol (N/A, 07/29/2017); Colonoscopy with propofol (N/A, 07/29/2017); and Cholecystectomy. [name] has a current medication list which includes the following prescription(s): anoro ellipta, apixaban, atorvastatin, carvedilol, enalapril, furosemide, glipizide, multivitamin, pantoprazole, paroxetine, potassium chloride sa, pramipexole, spironolactone, sucralfate, tizanidine, tramadol, gabapentin, metolazone, and oxycodone-acetaminophen. He  reports that he has quit smoking. He has a 30.00 pack-year smoking history. His smokeless tobacco use includes chew. He reports that he does not drink alcohol or use drugs. [name] has No Known Allergies.   HPI  Today, he is being contacted for a post-procedure assessment.  Evaluation of last interventional procedure  06/29/2019 Procedure:L5/S1 ESI  Influential Factors: Intra-procedural challenges: None observed.         Reported side-effects: None.        Post-procedural adverse reactions or complications: None reported         Sedation: Please see nurses note for DOS. When no sedatives are used, the analgesic levels obtained are directly associated to the effectiveness of the local anesthetics. However, when sedation is provided, the level of analgesia obtained during the initial 1 hour following the intervention, is believed to be the result of a combination of factors. These factors may include, but are not limited to: 1. The effectiveness of the local anesthetics used. 2. The effects of the analgesic(s) and/or anxiolytic(s) used. 3. The degree of discomfort experienced by the patient at the time of the procedure. 4.  The patients ability and reliability in recalling and recording the events. 5. The presence and influence of possible  secondary gains and/or psychosocial factors. Reported result: Relief experienced during the 1st hour after the procedure:0%   (Ultra-Short Term Relief)            Interpretative annotation: Unexpected result. Analgesia during this period is likely to be Local Anesthetic and/or IV Sedative (Analgesic/Anxiolytic) related.          Effects of local anesthetic: The analgesic effects attained during this period are directly associated to the localized infiltration of local anesthetics and therefore cary significant diagnostic value as to the etiological location, or anatomical origin, of the pain. Expected duration of relief is directly dependent on the pharmacodynamics of the local anesthetic used. Long-acting (4-6 hours) anesthetics used.  Reported result: Relief during the next 4 to 6 hour after the procedure:0%   (Short-Term Relief)            Interpretative annotation: Clinically appropriate result. Analgesia during this period is likely to be Local Anesthetic-related.          Long-term benefit: Defined as the period of time past the expected duration of local anesthetics (1 hour for short-acting and 4-6 hours for long-acting). With the possible exception of prolonged sympathetic blockade from the local anesthetics, benefits during this period are typically attributed to, or associated with, other factors such as analgesic sensory neuropraxia, antiinflammatory effects, or beneficial biochemical changes provided by agents other than the local anesthetics.  Reported result: Extended relief following procedure:   (Long-Term Relief)            Interpretative annotation: Clinically appropriate result. Good relief. No permanent benefit expected. Inflammation plays a part in the etiology to the pain.          Pertinent Labs   SAFETY SCREENING Profile Lab Results  Component Value Date    SARSCOV2NAA NOT DETECTED 06/03/2019   COVIDSOURCE NASOPHARYNGEAL 06/03/2019   Renal Function Lab Results  Component Value Date   BUN 41 (H) 01/24/2019   CREATININE 1.66 (H) 01/24/2019   GFRAA 48 (L) 01/24/2019   GFRNONAA 41 (L) 01/24/2019   Hepatic Function Lab Results  Component Value Date   AST 10 (L) 06/22/2014   ALT 18 06/22/2014   ALBUMIN 3.2 (L) 06/22/2014   UDS No results found for: SUMMARY Note: Above Lab results reviewed.   Assessment  The primary encounter diagnosis was Lumbar radiculopathy. 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  Plan of Care  I am having [name] maintain his Apixaban (ELIQUIS PO), carvedilol, enalapril, furosemide, glipiZIDE, multivitamin, pantoprazole, PARoxetine, sucralfate, gabapentin, metolazone, potassium chloride SA, spironolactone, oxyCODONE-acetaminophen, traMADol, pramipexole, Anoro Ellipta, tiZANidine, and atorvastatin.  I had extensive discussion with the patient regarding treatment plan.  Unfortunately the patient did not obtain significant pain relief after his lumbar epidural steroid injection.  Patient states that he has weakness of bilateral legs without much pain.  He states that the majority of his pain is in his lower back.  I again reviewed the patient's lumbar MRI and he has significant central spinal canal and neuroforaminal stenosis which contributes to his symptoms of neurogenic claudication.  I encouraged  the  patient to continue with any physical therapy exercises that he has learned at home regarding his spinal stenosis.  We also discussed repeating lumbar epidural steroid injection however I would like to wait as the patient states that his kidney function has deteriorated and has a follow-up with his nephrologist, Dr. Holley Raring tomorrow to review his kidney function.  We did have a discussion about titrating his gabapentin.  Patient has tried gabapentin 300 mg nightly without significant benefit.  Given the patient's most recent kidney function results, his GFR is 22 meaning that he can tolerate a max daily dose of gabapentin of 900 mg in 2-3 daily divided doses.  I instructed the patient to increase his gabapentin dose to 300 mg twice a day.  Risk and benefits of this medication were reviewed.  We will follow-up with the patient in approximately 1 month.   Follow-up plan:   Return in about 4 weeks (around 07/28/2019) for Medication Management, virtual.    Recent Visits Date Type Provider Dept  06/08/19 Procedure visit Gillis Santa, MD [facility]  05/11/19 Office Visit Gillis Santa, MD [facility]  Showing recent visits within past 90 days and meeting all other requirements   Today's Visits Date Type Provider Dept  06/30/19 Office Visit Gillis Santa, MD [facility]  Showing today's visits and meeting all other requirements   Future Appointments Date Type Provider Dept  07/28/19 Appointment Gillis Santa, MD [facility]  Showing future appointments within next 90 days and meeting all other requirements   I discussed the assessment and treatment plan with the patient. The patient was provided an opportunity to ask questions and all were answered. The patient agreed with the plan and demonstrated an understanding of the instructions.  Patient advised to call back or seek an in-person evaluation if the symptoms or condition worsens.  Total duration of  non-face-to-face encounter: 15 minutes.  Note by: Gillis Santa, MD Date: 06/30/2019; Time: 10:06 AM  Note: This dictation was prepared with Dragon dictation. Any transcriptional errors that may result from this process are unintentional.  Disclaimer:  * Given the special circumstances of the COVID-19 pandemic, the federal government has announced that the Office for Civil Rights (OCR) will exercise its enforcement discretion and will not impose penalties on physicians using telehealth in the event of noncompliance with regulatory requirements under the [place] (HIPAA) in connection with the good faith provision of telehealth during the ORVIF-53 national public health emergency. ([place])

## 2019-07-04 ENCOUNTER — Ambulatory Visit: Payer: Medicare Other | Admitting: Oncology

## 2019-07-04 ENCOUNTER — Inpatient Hospital Stay: Payer: Medicare Other | Admitting: Oncology

## 2019-07-04 ENCOUNTER — Inpatient Hospital Stay: Payer: Medicare Other

## 2019-07-04 NOTE — Progress Notes (Deleted)
[place]  [phone] [phone]  ID: [name] OB: May 28, 1946  MR#: [id number]  GGE#:[id number]  Patient Care Team: Derinda Late, MD as PCP - General (Family Medicine)  I connected with [name] on 07/04/19 at  9:45 AM EDT by video enabled telemedicine visit and verified that I am speaking with the correct person using two identifiers.   I discussed the limitations, risks, security and privacy concerns of performing an evaluation and management service by telemedicine and the availability of in-person appointments. I also discussed with the patient that there may be a patient responsible charge related to this service. The patient expressed understanding and agreed to proceed.   Other persons participating in the visit and their role in the encounter: Patient, MD  Patient's location: Home Provider's location: Clinic  CHIEF COMPLAINT: Anemia in chronic kidney disease, mild iron deficiency.  INTERVAL HISTORY: Patient agreed to video enabled telemedicine visit to discuss his laboratory results and further evaluation.  He continues to have significant back pain and all interventions have been postponed secondary to be COVID-19 pandemic, but patient reports he is returning to his orthopedic surgeon in the near future for reevaluation.  He has noticed increasing weakness and fatigue, but otherwise feels well.  He has no neurologic complaints.  He denies any chest pain, shortness of breath, cough, or hemoptysis.  He denies any abdominal pain.  He has no nausea, vomiting, constipation, or diarrhea.  He denies any melena or hematochezia.  He has no urinary complaints.  Patient otherwise feels well and offers no further specific complaints today.  REVIEW OF SYSTEMS:   Review of Systems  Constitutional: Positive for malaise/fatigue. Negative for fever and weight loss.  Respiratory: Negative.  Negative for cough, hemoptysis and  shortness of breath.   Cardiovascular: Negative.  Negative for chest pain and leg swelling.  Gastrointestinal: Negative.  Negative for abdominal pain and constipation.  Genitourinary: Negative.  Negative for hematuria.  Musculoskeletal: Positive for back pain.  Skin: Negative.  Negative for rash.  Neurological: Positive for weakness. Negative for dizziness, sensory change, focal weakness and headaches.  Psychiatric/Behavioral: Negative.  The patient is not nervous/anxious.     As per HPI. Otherwise, a complete review of systems is negative.  PAST MEDICAL HISTORY: Past Medical History:  Diagnosis Date  . Chronic back pain   . COPD (chronic obstructive pulmonary disease) ([place])   . Depression   . Diabetes mellitus without complication ([place])   . Hyperlipidemia   . Hypertension   . Renal cell carcinoma ([place])   . Sleep apnea     PAST SURGICAL HISTORY: Past Surgical History:  Procedure Laterality Date  . CARPAL TUNNEL RELEASE    . CHOLECYSTECTOMY    . COLONOSCOPY WITH PROPOFOL N/A 07/29/2017   Procedure: COLONOSCOPY WITH PROPOFOL;  Surgeon: Manya Silvas, MD;  Location: [facility] ENDOSCOPY;  Service: Endoscopy;  Laterality: N/A;  . colonoscopys    . ESOPHAGOGASTRODUODENOSCOPY (EGD) WITH PROPOFOL N/A 07/29/2017   Procedure: ESOPHAGOGASTRODUODENOSCOPY (EGD) WITH PROPOFOL;  Surgeon: Manya Silvas, MD;  Location: [facility] END[facility]COPY;  Service: Endoscopy;  Laterality: N/A;  . FLEXIBLE SIGMOIDOSCOPY    . GASTRIC BYPASS    . NEPHRECTOMY    . open reduction and internal fixation, right distal radius    . right knee arthroscopy    . UPPER GI ENDOSCOPY      FAMILY HISTORY: Family History  Problem Relation Age of Onset  . Diabetes Mother   . Cancer Mother   .  Heart disease Father     ADVANCED DIRECTIVES (Y/N):  N  HEALTH MAINTENANCE: Social History   Tobacco Use  . Smoking status: Former Smoker    Packs/day: 1.00    Years: 30.00    Pack years: 30.00  . Smokeless tobacco:  Current User    Types: Chew  Substance Use Topics  . Alcohol use: No    Frequency: Never  . Drug use: No     Colonoscopy:  PAP:  Bone density:  Lipid panel:  No Known Allergies  Current Outpatient Medications  Medication Sig Dispense Refill  . ANORO ELLIPTA 62.5-25 MCG/INH AEPB     . Apixaban (ELIQUIS PO) Take 5 mg by mouth daily.     Marland Kitchen atorvastatin (LIPITOR) 10 MG tablet Take 10 mg by mouth daily.    . carvedilol (COREG) 6.25 MG tablet Take 6.25 mg by mouth 2 (two) times daily with a meal.    . enalapril (VASOTEC) 20 MG tablet Take 20 mg by mouth daily.    . furosemide (LASIX) 40 MG tablet Take 40 mg by mouth 2 (two) times daily.     Marland Kitchen gabapentin (NEURONTIN) 300 MG capsule Take 300 mg by mouth at bedtime.     Marland Kitchen glipiZIDE (GLUCOTROL) 5 MG tablet Take 10 mg by mouth daily before breakfast.     . metolazone (ZAROXOLYN) 2.5 MG tablet     . Multiple Vitamin (MULTIVITAMIN) tablet Take 1 tablet by mouth daily.    Marland Kitchen oxyCODONE-acetaminophen (PERCOCET) 5-325 MG tablet Take 1 tablet by mouth every 8 (eight) hours as needed. (Patient not taking: Reported on 06/29/2019) 20 tablet 0  . pantoprazole (PROTONIX) 40 MG tablet Take 40 mg by mouth daily.    Marland Kitchen PARoxetine (PAXIL) 40 MG tablet Take 40 mg by mouth every morning.     . potassium chloride SA (K-DUR,KLOR-CON) 20 MEQ tablet Take 20 mEq by mouth daily.     . pramipexole (MIRAPEX) 1 MG tablet Take by mouth.    . spironolactone (ALDACTONE) 25 MG tablet Take 25 mg by mouth daily.     . sucralfate (CARAFATE) 1 g tablet Take 1 g by mouth 4 (four) times daily -  with meals and at bedtime.    Marland Kitchen tiZANidine (ZANAFLEX) 2 MG tablet Take 1 tablet (2 mg total) by mouth every 8 (eight) hours as needed for muscle spasms. 60 tablet 1  . traMADol (ULTRAM) 50 MG tablet Take 50 mg by mouth every 6 (six) hours as needed.     No current facility-administered medications for this visit.     OBJECTIVE: There were no vitals filed for this visit.   There is no  height or weight on file to calculate BMI.    ECOG FS:0 - Asymptomatic  General: Well-developed, well-nourished, no acute distress. HEENT: Normocephalic. Skin: No rashes or petechiae noted. Neuro: Alert, answering all questions appropriately.  Cranial nerves grossly intact. Psych: Normal affect.   LAB RESULTS:  Lab Results  Component Value Date   NA 140 01/24/2019   K 5.6 (H) 01/24/2019   CL 113 (H) 01/24/2019   CO2 23 01/24/2019   GLUCOSE 209 (H) 01/24/2019   BUN 41 (H) 01/24/2019   CREATININE 1.66 (H) 01/24/2019   CALCIUM 8.9 01/24/2019   PROT 6.9 06/22/2014   ALBUMIN 3.2 (L) 06/22/2014   AST 10 (L) 06/22/2014   ALT 18 06/22/2014   ALKPHOS 104 06/22/2014   BILITOT 0.4 06/22/2014   GFRNONAA 41 (L) 01/24/2019   GFRAA 48 (  L) 01/24/2019    Lab Results  Component Value Date   WBC 4.7 05/19/2019   NEUTROABS 3.4 05/19/2019   HGB 9.5 (L) 05/19/2019   HCT 31.9 (L) 05/19/2019   MCV 95.8 05/19/2019   PLT 176 05/19/2019   Lab Results  Component Value Date   IRON 68 05/19/2019   TIBC 344 05/19/2019   IRONPCTSAT 20 05/19/2019   Lab Results  Component Value Date   FERRITIN 23 (L) 05/19/2019     STUDIES: [facility] C-arm 1-60 Min No Report  Result Date: 06/13/2019 Fluoro was used, but no Radiologist interpretation will be provided. Please refer to "NOTES" tab for provider progress note.   ASSESSMENT: Anemia in chronic kidney disease, mild iron deficiency.  PLAN:    1. Anemia in chronic kidney disease, mild iron deficiency: Patient's hemoglobin is trending down and he is symptomatic.  His iron stores have trended up, therefore he does not require IV Feraheme today.  Given his chronic renal insufficiency, patient will benefit from Procrit and will return to clinic early next week to receive 40,000 units subcutaneous Procrit.  He would then return to clinic in 6 weeks for repeat laboratory work, further evaluation, and consideration of additional of treatment.  2.   Chronic renal insufficiency: Chronic and unchanged.  Continue treatment and monitoring per nephrology. 3.  Vascular insufficiency: Continue follow-up with vascular surgery as scheduled. 4.  Back pain: Continue follow-up and treatment per orthopedics.    I provided 25 minutes of face-to-face video visit time during this encounter, and > 50% was spent counseling as documented under my assessment & plan.    Patient expressed understanding and was in agreement with this plan. He also understands that He can call clinic at any time with any questions, concerns, or complaints.    Jacquelin Hawking, NP   07/04/2019 9:02 AM

## 2019-07-05 ENCOUNTER — Inpatient Hospital Stay
Admission: EM | Admit: 2019-07-05 | Discharge: 2019-07-08 | DRG: 641 | Disposition: A | Discharge status: Home / Self Care | Payer: Medicare Other | Attending: Internal Medicine | Admitting: Internal Medicine

## 2019-07-05 ENCOUNTER — Encounter: Payer: Self-pay | Admitting: Emergency Medicine

## 2019-07-05 ENCOUNTER — Telehealth: Payer: Self-pay | Admitting: Oncology

## 2019-07-05 DIAGNOSIS — J449 Chronic obstructive pulmonary disease, unspecified: Secondary | ICD-10-CM | POA: Diagnosis present

## 2019-07-05 DIAGNOSIS — N179 Acute kidney failure, unspecified: Secondary | ICD-10-CM | POA: Diagnosis not present

## 2019-07-05 DIAGNOSIS — Z9884 Bariatric surgery status: Secondary | ICD-10-CM | POA: Diagnosis not present

## 2019-07-05 DIAGNOSIS — R05 Cough: Secondary | ICD-10-CM

## 2019-07-05 DIAGNOSIS — Z9049 Acquired absence of other specified parts of digestive tract: Secondary | ICD-10-CM

## 2019-07-05 DIAGNOSIS — Z79899 Other long term (current) drug therapy: Secondary | ICD-10-CM

## 2019-07-05 DIAGNOSIS — I5022 Chronic systolic (congestive) heart failure: Secondary | ICD-10-CM | POA: Diagnosis present

## 2019-07-05 DIAGNOSIS — Z833 Family history of diabetes mellitus: Secondary | ICD-10-CM

## 2019-07-05 DIAGNOSIS — I739 Peripheral vascular disease, unspecified: Secondary | ICD-10-CM | POA: Diagnosis present

## 2019-07-05 DIAGNOSIS — Z720 Tobacco use: Secondary | ICD-10-CM | POA: Diagnosis not present

## 2019-07-05 DIAGNOSIS — Z9181 History of falling: Secondary | ICD-10-CM

## 2019-07-05 DIAGNOSIS — Z79891 Long term (current) use of opiate analgesic: Secondary | ICD-10-CM

## 2019-07-05 DIAGNOSIS — F419 Anxiety disorder, unspecified: Secondary | ICD-10-CM | POA: Diagnosis present

## 2019-07-05 DIAGNOSIS — G8929 Other chronic pain: Secondary | ICD-10-CM | POA: Diagnosis present

## 2019-07-05 DIAGNOSIS — F329 Major depressive disorder, single episode, unspecified: Secondary | ICD-10-CM | POA: Diagnosis present

## 2019-07-05 DIAGNOSIS — E1122 Type 2 diabetes mellitus with diabetic chronic kidney disease: Secondary | ICD-10-CM | POA: Diagnosis present

## 2019-07-05 DIAGNOSIS — N183 Chronic kidney disease, stage 3 (moderate): Secondary | ICD-10-CM | POA: Diagnosis present

## 2019-07-05 DIAGNOSIS — Z7951 Long term (current) use of inhaled steroids: Secondary | ICD-10-CM

## 2019-07-05 DIAGNOSIS — I890 Lymphedema, not elsewhere classified: Secondary | ICD-10-CM | POA: Diagnosis present

## 2019-07-05 DIAGNOSIS — Z86718 Personal history of other venous thrombosis and embolism: Secondary | ICD-10-CM

## 2019-07-05 DIAGNOSIS — G4733 Obstructive sleep apnea (adult) (pediatric): Secondary | ICD-10-CM | POA: Diagnosis present

## 2019-07-05 DIAGNOSIS — Z8711 Personal history of peptic ulcer disease: Secondary | ICD-10-CM

## 2019-07-05 DIAGNOSIS — D509 Iron deficiency anemia, unspecified: Secondary | ICD-10-CM | POA: Diagnosis present

## 2019-07-05 DIAGNOSIS — Z905 Acquired absence of kidney: Secondary | ICD-10-CM

## 2019-07-05 DIAGNOSIS — I130 Hypertensive heart and chronic kidney disease with heart failure and stage 1 through stage 4 chronic kidney disease, or unspecified chronic kidney disease: Secondary | ICD-10-CM | POA: Diagnosis present

## 2019-07-05 DIAGNOSIS — D631 Anemia in chronic kidney disease: Secondary | ICD-10-CM | POA: Diagnosis present

## 2019-07-05 DIAGNOSIS — Z7901 Long term (current) use of anticoagulants: Secondary | ICD-10-CM

## 2019-07-05 DIAGNOSIS — Z20828 Contact with and (suspected) exposure to other viral communicable diseases: Secondary | ICD-10-CM | POA: Diagnosis present

## 2019-07-05 DIAGNOSIS — Z85528 Personal history of other malignant neoplasm of kidney: Secondary | ICD-10-CM

## 2019-07-05 DIAGNOSIS — E785 Hyperlipidemia, unspecified: Secondary | ICD-10-CM | POA: Diagnosis present

## 2019-07-05 DIAGNOSIS — E875 Hyperkalemia: Principal | ICD-10-CM | POA: Diagnosis present

## 2019-07-05 DIAGNOSIS — Z8249 Family history of ischemic heart disease and other diseases of the circulatory system: Secondary | ICD-10-CM

## 2019-07-05 DIAGNOSIS — R059 Cough, unspecified: Secondary | ICD-10-CM

## 2019-07-05 LAB — COMPREHENSIVE METABOLIC PANEL
ALT: 35 U/L (ref 0–44)
AST: 27 U/L (ref 15–41)
Albumin: 3.8 g/dL (ref 3.5–5.0)
Alkaline Phosphatase: 120 U/L (ref 38–126)
Anion gap: 4 — ABNORMAL LOW (ref 5–15)
BUN: 83 mg/dL — ABNORMAL HIGH (ref 8–23)
CO2: 13 mmol/L — ABNORMAL LOW (ref 22–32)
Calcium: 8.5 mg/dL — ABNORMAL LOW (ref 8.9–10.3)
Chloride: 123 mmol/L — ABNORMAL HIGH (ref 98–111)
Creatinine, Ser: 2.65 mg/dL — ABNORMAL HIGH (ref 0.61–1.24)
GFR calc Af Amer: 27 mL/min — ABNORMAL LOW (ref >60)
GFR calc non Af Amer: 23 mL/min — ABNORMAL LOW (ref >60)
Glucose, Bld: 132 mg/dL — ABNORMAL HIGH (ref 70–99)
Potassium: >7.5 mmol/L (ref 3.5–5.1)
Sodium: 140 mmol/L (ref 135–145)
Total Bilirubin: 0.4 mg/dL (ref 0.3–1.2)
Total Protein: 7.1 g/dL (ref 6.5–8.1)

## 2019-07-05 LAB — CBC
HCT: 32.9 % — ABNORMAL LOW (ref 39.0–52.0)
Hemoglobin: 9.6 g/dL — ABNORMAL LOW (ref 13.0–17.0)
MCH: 29.1 pg (ref 26.0–34.0)
MCHC: 29.2 g/dL — ABNORMAL LOW (ref 30.0–36.0)
MCV: 99.7 fL (ref 80.0–100.0)
Platelets: 148 10*3/uL — ABNORMAL LOW (ref 150–400)
RBC: 3.3 MIL/uL — ABNORMAL LOW (ref 4.22–5.81)
RDW: 15.8 % — ABNORMAL HIGH (ref 11.5–15.5)
WBC: 5.5 10*3/uL (ref 4.0–10.5)
nRBC: 0 % (ref 0.0–0.2)

## 2019-07-05 LAB — BASIC METABOLIC PANEL
Anion gap: 4 — ABNORMAL LOW (ref 5–15)
BUN: 80 mg/dL — ABNORMAL HIGH (ref 8–23)
CO2: 13 mmol/L — ABNORMAL LOW (ref 22–32)
Calcium: 8.4 mg/dL — ABNORMAL LOW (ref 8.9–10.3)
Chloride: 124 mmol/L — ABNORMAL HIGH (ref 98–111)
Creatinine, Ser: 2.54 mg/dL — ABNORMAL HIGH (ref 0.61–1.24)
GFR calc Af Amer: 29 mL/min — ABNORMAL LOW (ref >60)
GFR calc non Af Amer: 25 mL/min — ABNORMAL LOW (ref >60)
Glucose, Bld: 121 mg/dL — ABNORMAL HIGH (ref 70–99)
Potassium: >7.5 mmol/L (ref 3.5–5.1)
Sodium: 141 mmol/L (ref 135–145)

## 2019-07-05 LAB — SARS CORONAVIRUS 2 BY RT PCR (HOSPITAL ORDER, PERFORMED IN ~~LOC~~ HOSPITAL LAB): SARS Coronavirus 2: NEGATIVE

## 2019-07-05 LAB — GLUCOSE, CAPILLARY
Glucose-Capillary: 117 mg/dL — ABNORMAL HIGH (ref 70–99)
Glucose-Capillary: 66 mg/dL — ABNORMAL LOW (ref 70–99)
Glucose-Capillary: 90 mg/dL (ref 70–99)
Glucose-Capillary: 104 mg/dL — ABNORMAL HIGH (ref 70–99)

## 2019-07-05 LAB — MAGNESIUM: Magnesium: 2.2 mg/dL (ref 1.7–2.4)

## 2019-07-05 MED ORDER — DEXTROSE 50 % IV SOLN
25.0000 g | Freq: Once | INTRAVENOUS | Status: AC
  Administered 2019-07-05: 25 g via INTRAVENOUS
  Filled 2019-07-05: qty 50

## 2019-07-05 MED ORDER — ALBUTEROL SULFATE (2.5 MG/3ML) 0.083% IN NEBU: 2.5000 mg | INHALATION_SOLUTION | RESPIRATORY_TRACT | Status: DC

## 2019-07-05 MED ORDER — GABAPENTIN 300 MG PO CAPS
300.0000 mg | ORAL_CAPSULE | Freq: Every day | ORAL | Status: DC
  Administered 2019-07-05 – 2019-07-07 (×3): 300 mg via ORAL
  Filled 2019-07-05 (×3): qty 1

## 2019-07-05 MED ORDER — TRAMADOL HCL 50 MG PO TABS: 50.0000 mg | ORAL_TABLET | Freq: Four times a day (QID) | ORAL | Status: DC | PRN

## 2019-07-05 MED ORDER — FUROSEMIDE 40 MG PO TABS
40.0000 mg | ORAL_TABLET | Freq: Two times a day (BID) | ORAL | Status: DC
  Administered 2019-07-05 – 2019-07-06 (×2): 40 mg via ORAL
  Filled 2019-07-05 (×2): qty 1

## 2019-07-05 MED ORDER — DULOXETINE HCL 30 MG PO CPEP
30.0000 mg | ORAL_CAPSULE | Freq: Every day | ORAL | Status: DC
  Administered 2019-07-06 – 2019-07-08 (×3): 30 mg via ORAL
  Filled 2019-07-05 (×3): qty 1

## 2019-07-05 MED ORDER — ATORVASTATIN CALCIUM 10 MG PO TABS
10.0000 mg | ORAL_TABLET | Freq: Every day | ORAL | Status: DC
  Administered 2019-07-05 – 2019-07-07 (×3): 10 mg via ORAL
  Filled 2019-07-05 (×3): qty 1

## 2019-07-05 MED ORDER — PANTOPRAZOLE SODIUM 40 MG PO TBEC
40.0000 mg | DELAYED_RELEASE_TABLET | Freq: Two times a day (BID) | ORAL | Status: DC
  Administered 2019-07-05 – 2019-07-08 (×6): 40 mg via ORAL
  Filled 2019-07-05 (×6): qty 1

## 2019-07-05 MED ORDER — APIXABAN 5 MG PO TABS
5.0000 mg | ORAL_TABLET | Freq: Two times a day (BID) | ORAL | Status: DC
  Administered 2019-07-05 – 2019-07-08 (×6): 5 mg via ORAL
  Filled 2019-07-05 (×6): qty 1

## 2019-07-05 MED ORDER — DEXTROSE 50 % IV SOLN
50.0000 mL | Freq: Once | INTRAVENOUS | Status: AC
  Administered 2019-07-05: 50 mL via INTRAVENOUS
  Filled 2019-07-05: qty 50

## 2019-07-05 MED ORDER — CALCIUM GLUCONATE-NACL 1-0.675 GM/50ML-% IV SOLN
1.0000 g | Freq: Once | INTRAVENOUS | Status: AC
  Administered 2019-07-05: 1000 mg via INTRAVENOUS
  Filled 2019-07-05: qty 50

## 2019-07-05 MED ORDER — INSULIN ASPART 100 UNIT/ML ~~LOC~~ SOLN
10.0000 [IU] | Freq: Once | SUBCUTANEOUS | Status: AC
  Administered 2019-07-05: 10 [IU] via INTRAVENOUS
  Filled 2019-07-05: qty 1

## 2019-07-05 MED ORDER — CARVEDILOL 6.25 MG PO TABS
6.2500 mg | ORAL_TABLET | Freq: Two times a day (BID) | ORAL | Status: DC
  Administered 2019-07-05 – 2019-07-08 (×6): 6.25 mg via ORAL
  Filled 2019-07-05 (×6): qty 1

## 2019-07-05 MED ORDER — UMECLIDINIUM-VILANTEROL 62.5-25 MCG/INH IN AEPB
1.0000 | INHALATION_SPRAY | Freq: Every day | RESPIRATORY_TRACT | Status: DC
  Administered 2019-07-06 – 2019-07-08 (×3): 1 via RESPIRATORY_TRACT
  Filled 2019-07-05: qty 14

## 2019-07-05 MED ORDER — ADULT MULTIVITAMIN W/MINERALS CH
1.0000 | ORAL_TABLET | Freq: Every day | ORAL | Status: DC
  Administered 2019-07-06: 1 via ORAL
  Filled 2019-07-05: qty 1

## 2019-07-05 MED ORDER — SODIUM BICARBONATE 8.4 % IV SOLN
50.0000 meq | Freq: Once | INTRAVENOUS | Status: AC
  Administered 2019-07-05: 50 meq via INTRAVENOUS
  Filled 2019-07-05: qty 50

## 2019-07-05 MED ORDER — TIZANIDINE HCL 2 MG PO TABS
2.0000 mg | ORAL_TABLET | Freq: Three times a day (TID) | ORAL | Status: DC | PRN
  Filled 2019-07-05: qty 1

## 2019-07-05 MED ORDER — PAROXETINE HCL 20 MG PO TABS
40.0000 mg | ORAL_TABLET | ORAL | Status: DC
  Administered 2019-07-06 – 2019-07-08 (×3): 40 mg via ORAL
  Filled 2019-07-05 (×3): qty 2

## 2019-07-05 MED ORDER — SUCRALFATE 1 G PO TABS
1.0000 g | ORAL_TABLET | Freq: Three times a day (TID) | ORAL | Status: DC
  Administered 2019-07-05 – 2019-07-07 (×8): 1 g via ORAL
  Filled 2019-07-05 (×10): qty 1

## 2019-07-05 MED ORDER — FLUTICASONE PROPIONATE 50 MCG/ACT NA SUSP
1.0000 | Freq: Two times a day (BID) | NASAL | Status: DC
  Administered 2019-07-05 – 2019-07-08 (×6): 1 via NASAL
  Filled 2019-07-05: qty 16

## 2019-07-05 MED ORDER — PRAMIPEXOLE DIHYDROCHLORIDE 1 MG PO TABS
1.0000 mg | ORAL_TABLET | Freq: Two times a day (BID) | ORAL | Status: DC
  Administered 2019-07-05 – 2019-07-08 (×6): 1 mg via ORAL
  Filled 2019-07-05 (×7): qty 1

## 2019-07-05 MED ORDER — SODIUM CHLORIDE 0.9 % IV BOLUS
1000.0000 mL | Freq: Once | INTRAVENOUS | Status: AC
  Administered 2019-07-05: 1000 mL via INTRAVENOUS

## 2019-07-05 MED ORDER — ALBUTEROL SULFATE (2.5 MG/3ML) 0.083% IN NEBU
2.5000 mg | INHALATION_SOLUTION | RESPIRATORY_TRACT | Status: DC | PRN
  Filled 2019-07-05: qty 3

## 2019-07-05 MED ORDER — INSULIN ASPART 100 UNIT/ML ~~LOC~~ SOLN
SUBCUTANEOUS | Status: AC
  Filled 2019-07-05: qty 1

## 2019-07-05 MED ORDER — SODIUM ZIRCONIUM CYCLOSILICATE 10 G PO PACK
10.0000 g | PACK | Freq: Once | ORAL | Status: AC
  Administered 2019-07-05: 10 g via ORAL
  Filled 2019-07-05: qty 1

## 2019-07-05 MED ORDER — INSULIN ASPART 100 UNIT/ML IV SOLN
10.0000 [IU] | Freq: Once | INTRAVENOUS | Status: AC
  Administered 2019-07-05: 10 [IU] via INTRAVENOUS
  Filled 2019-07-05: qty 0.1

## 2019-07-05 NOTE — ED Triage Notes (Signed)
Patient presents to ED via POV from home. Patient was called by his PCP office and told to come to ED after abnormal lab results. Patient reports "I dont know what it was but they told me by potassium was high and my kidney function was messed up. I only have one kidney". 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

## 2019-07-05 NOTE — ED Notes (Signed)
Wife called at this time to update on pt admission to [facility], verbalizes understanding

## 2019-07-05 NOTE — ED Notes (Signed)
RN called report at this time, receiving RN unable to take report at this time. Will try again

## 2019-07-05 NOTE — Telephone Encounter (Signed)
Pt's wife called to r\s appt, them\n changed her mind per secure chat message from [place]. Message was left for pt to contact office on 7/20 to r\s appt. Will r\s pt once they call back.

## 2019-07-05 NOTE — ED Notes (Signed)
RN called to give report at this time, receiving RN unable to take report at this time. Will try again

## 2019-07-05 NOTE — ED Provider Notes (Addendum)
[facility] Emergency Department Provider Note  Time seen: 11:59 AM  I have reviewed the triage vital signs and the nursing notes.   HISTORY  Chief Complaint Abnormal Lab   HPI [name] is a 73 y.o. male with a past medical history of chronic pain, COPD, diabetes, hypertension, hyperlipidemia, renal cell carcinoma status post nephrectomy presents to the emergency department for abnormal lab work.  According to the patient he was seen by his nephrologist Dr. Holley Raring, had routine blood work performed which showed an elevated potassium as well as decreased renal function and was referred to the emergency department for evaluation.  Here the patient denies any recent nausea vomiting or diarrhea.  Denies abdominal pain dysuria or hematuria.  Also denies any fever cough congestion or shortness of breath.   Past Medical History:  Diagnosis Date  . Chronic back pain   . COPD (chronic obstructive pulmonary disease) ([place])   . Depression   . Diabetes mellitus without complication ([place])   . Hyperlipidemia   . Hypertension   . Renal cell carcinoma ([place])   . Sleep apnea     Patient Active Problem List   Diagnosis Date Noted  . Lumbar degenerative disc disease 05/11/2019  . Spinal stenosis, lumbar region, with neurogenic claudication 05/11/2019  . Ankylosis of lumbar spine 05/11/2019  . Neuroforaminal stenosis of lumbar spine (left, L5/S1) 05/11/2019  . Lumbar facet arthropathy 05/11/2019  . Chronic pain syndrome 05/11/2019  . Chronic obstructive pulmonary disease ([place]) 01/03/2019  . Atherosclerotic peripheral vascular disease ([place]) 09/01/2018  . Lymphedema 06/22/2018  . Iron deficiency anemia 05/01/2018  . Anemia in chronic kidney disease 04/26/2018  . Varicose veins of leg with swelling, bilateral 04/09/2018  . SOB (shortness of breath) on exertion 03/17/2018  . Bilateral lower extremity edema 02/23/2018  . Lower extremity pain, bilateral 02/23/2018   . Essential hypertension 02/23/2018  . H/O deep venous thrombosis 01/23/2017  . Type II or unspecified type diabetes mellitus without mention of complication, not stated as uncontrolled 06/19/2014  . Chronic kidney disease, unspecified 06/19/2014  . Anxiety state 06/19/2014  . Other and unspecified hyperlipidemia 06/19/2014    Past Surgical History:  Procedure Laterality Date  . CARPAL TUNNEL RELEASE    . CHOLECYSTECTOMY    . COLONOSCOPY WITH PROPOFOL N/A 07/29/2017   Procedure: COLONOSCOPY WITH PROPOFOL;  Surgeon: Manya Silvas, MD;  Location: [facility] ENDOSCOPY;  Service: Endoscopy;  Laterality: N/A;  . colonoscopys    . ESOPHAGOGASTRODUODENOSCOPY (EGD) WITH PROPOFOL N/A 07/29/2017   Procedure: ESOPHAGOGASTRODUODENOSCOPY (EGD) WITH PROPOFOL;  Surgeon: Manya Silvas, MD;  Location: [facility] ENDOSCOPY;  Service: Endoscopy;  Laterality: N/A;  . FLEXIBLE SIGMOIDOSCOPY    . GASTRIC BYPASS    . NEPHRECTOMY    . open reduction and internal fixation, right distal radius    . right knee arthroscopy    . UPPER GI ENDOSCOPY      Prior to Admission medications   Medication Sig Start Date End Date Taking? Authorizing Provider  Celedonio Miyamoto 62.5-25 MCG/INH AEPB  05/18/19   [provider]  Apixaban (ELIQUIS PO) Take 5 mg by mouth daily.     [provider]  atorvastatin (LIPITOR) 10 MG tablet Take 10 mg by mouth daily.    [provider]  carvedilol (COREG) 6.25 MG tablet Take 6.25 mg by mouth 2 (two) times daily with a meal.    [provider]  enalapril (VASOTEC) 20 MG tablet Take 20 mg by mouth daily.  [provider]  furosemide (LASIX) 40 MG tablet Take 40 mg by mouth 2 (two) times daily.     [provider]  gabapentin (NEURONTIN) 300 MG capsule Take 300 mg by mouth at bedtime.     [provider]  glipiZIDE (GLUCOTROL) 5 MG tablet Take 10 mg by mouth daily before breakfast.     [provider]  metolazone  (ZAROXOLYN) 2.5 MG tablet  02/22/18   [provider]  Multiple Vitamin (MULTIVITAMIN) tablet Take 1 tablet by mouth daily.    [provider]  oxyCODONE-acetaminophen (PERCOCET) 5-325 MG tablet Take 1 tablet by mouth every 8 (eight) hours as needed. Patient not taking: Reported on 06/29/2019 01/24/19   Earleen Newport, MD  pantoprazole (PROTONIX) 40 MG tablet Take 40 mg by mouth daily.    [provider]  PARoxetine (PAXIL) 40 MG tablet Take 40 mg by mouth every morning.     [provider]  potassium chloride SA (K-DUR,KLOR-CON) 20 MEQ tablet Take 20 mEq by mouth daily.  02/22/18 06/29/19  [provider]  pramipexole (MIRAPEX) 1 MG tablet Take by mouth. 05/11/19   [provider]  spironolactone (ALDACTONE) 25 MG tablet Take 25 mg by mouth daily.  02/22/18 06/29/19  [provider]  sucralfate (CARAFATE) 1 g tablet Take 1 g by mouth 4 (four) times daily -  with meals and at bedtime.    [provider]  tiZANidine (ZANAFLEX) 2 MG tablet Take 1 tablet (2 mg total) by mouth every 8 (eight) hours as needed for muscle spasms. 06/08/19 07/18/19  Gillis Santa, MD  traMADol (ULTRAM) 50 MG tablet Take 50 mg by mouth every 6 (six) hours as needed.    [provider]    No Known Allergies  Family History  Problem Relation Age of Onset  . Diabetes Mother   . Cancer Mother   . Heart disease Father     Social History Social History   Tobacco Use  . Smoking status: Former Smoker    Packs/day: 1.00    Years: 30.00    Pack years: 30.00  . Smokeless tobacco: Current User    Types: Chew  Substance Use Topics  . Alcohol use: No    Frequency: Never  . Drug use: No    Review of Systems Constitutional: Negative for fever. ENT: Negative for recent illness/congestion Cardiovascular: Negative for chest pain. Respiratory: Negative for shortness of breath. Gastrointestinal: Negative for abdominal pain, vomiting and  diarrhea. Genitourinary: Negative for urinary compaints Musculoskeletal: Chronic lower back pain Skin: Negative for skin complaints  Neurological: Negative for headache All other ROS negative  ____________________________________________   PHYSICAL EXAM:  VITAL SIGNS: ED Triage Vitals  Enc Vitals Group     BP 07/05/19 1031 (!) 133/101     Pulse Rate 07/05/19 1031 64     Resp 07/05/19 1132 19     Temp 07/05/19 1031 97.9 F (36.6 C)     Temp Source 07/05/19 1031 Oral     SpO2 07/05/19 1031 97 %     Weight 07/05/19 1032 280 lb (127 kg)     Height 07/05/19 1032 6' (1.829 m)     Head Circumference --      Peak Flow --      Pain Score 07/05/19 1038 8     Pain Loc --      Pain Edu? --      Excl. in [place]? --    Constitutional: Alert and  oriented. Well appearing and in no distress. Eyes: Normal exam ENT      Head: Normocephalic and atraumatic.      Mouth/Throat: Mucous membranes are moist. Cardiovascular: Normal rate, regular rhythm. No murmur Respiratory: Normal respiratory effort without tachypnea nor retractions. Breath sounds are clear Gastrointestinal: Soft and nontender. No distention.   Musculoskeletal: Nontender with normal range of motion in all extremities. Neurologic:  Normal speech and language. No gross focal neurologic deficits Skin:  Skin is warm, dry and intact.  Psychiatric: Mood and affect are normal.   ____________________________________________    EKG  EKG viewed and interpreted by myself shows a sinus rhythm at 98 bpm with a narrow QRS, normal axis, normal intervals, nonspecific ST changes.  ____________________________________________   INITIAL IMPRESSION / ASSESSMENT AND PLAN / ED COURSE  Pertinent labs & imaging results that were available during my care of the patient were reviewed by me and considered in my medical decision making (see chart for details).   Patient presents to the emergency department for abnormal lab work referred by his  nephrologist.  Patient states he was told his potassium was elevated as well as renal function had decreased.  Differential would include lab error, hemolysis, renal failure, hyperkalemia.  Repeat lab work shows a potassium of 7.7, with a creatinine of 2.6.  Patient will be started on IV fluids, insulin, glucose, Lokelma.  Patient agreeable to plan of care.  Patient require admission to the hospitalist service.  We will discuss with nephrology as well.  [name] was evaluated in Emergency Department on 07/05/2019 for the symptoms described in the history of present illness. He was evaluated in the context of the global COVID-19 pandemic, which necessitated consideration that the patient might be at risk for infection with the SARS-CoV-2 virus that causes COVID-19. Institutional protocols and algorithms that pertain to the evaluation of patients at risk for COVID-19 are in a state of rapid change based on information released by regulatory bodies including the CDC and federal and state organizations. These policies and algorithms were followed during the patient's care in the ED.  CRITICAL CARE Performed by: Harvest Dark   Total critical care time: 30 minutes  Critical care time was exclusive of separately billable procedures and treating other patients.  Critical care was necessary to treat or prevent imminent or life-threatening deterioration.  Critical care was time spent personally by me on the following activities: development of treatment plan with patient and/or surrogate as well as nursing, discussions with consultants, evaluation of patient's response to treatment, examination of patient, obtaining history from patient or surrogate, ordering and performing treatments and interventions, ordering and review of laboratory studies, ordering and review of radiographic studies, pulse oximetry and re-evaluation of patient's  condition.   ____________________________________________   FINAL CLINICAL IMPRESSION(S) / ED DIAGNOSES  Acute renal failure Hyperkalemia   Harvest Dark, MD 07/05/19 1202    Harvest Dark, MD 07/05/19 [phone]

## 2019-07-05 NOTE — H&P (Addendum)
[place] [facility] [place] NAME: [name]    MR#:  [id number]  DATE OF BIRTH:  11/20/1946  DATE OF ADMISSION:  07/05/2019  PRIMARY CARE PHYSICIAN: Derinda Late, MD   REQUESTING/REFERRING PHYSICIAN: Harvest Dark, MD  CHIEF COMPLAINT:   Chief Complaint  Patient presents with  . Abnormal Lab   HISTORY OF PRESENT ILLNESS:  73 y.o. male with pertinent past medical history of COPD, PVD with lymphedema, anemia and chronic kidney disease, iron deficiency anemia, hypertension, type 2 diabetes mellitus with complication, DVT, CKD, renal cell carcinoma, OSA, anxiety and depression, and chronic back pain presenting to the ED per his nephrology request for hyperkalemia.  Patient sleepy and unable to provide history so history mostly obtained from ED reports.  Per ED report patient was called by his PCP office and told to come to the ED after labs revealed elevated potassium levels of greater than 7.5.  Patient denies any symptoms however he does say that he has had generalized weakness predominantly in his lower extremity causing his knees to buckle up and fall x2.  Denies any other associated symptoms.  On arrival to the ED, he was afebrile with blood pressure 133/101 mm Hg and pulse rate 64 beats/min. There were no focal neurological deficits; he was alert and oriented x4, and he did not demonstrate any memory deficits.  Repeat labs showed potassium of 7.7, with a creatinine of 2.6.  Patient was started on IV fluids, given insulin, Dextrose and Lokelma in the ED. Patient is being admitted under hospitalist service for further management.  PAST MEDICAL HISTORY:   Past Medical History:  Diagnosis Date  . Chronic back pain   . COPD (chronic obstructive pulmonary disease) ([place])   . Depression   . Diabetes mellitus without complication ([place])   . Hyperlipidemia   . Hypertension   . Renal cell carcinoma ([place])   . Sleep apnea     PAST  SURGICAL HISTORY:   Past Surgical History:  Procedure Laterality Date  . CARPAL TUNNEL RELEASE    . CHOLECYSTECTOMY    . COLONOSCOPY WITH PROPOFOL N/A 07/29/2017   Procedure: COLONOSCOPY WITH PROPOFOL;  Surgeon: Manya Silvas, MD;  Location[facility] ENDOSCOPY;  Service: Endoscopy;  Laterality: N/A;  . colonoscopys    . ESOPHAGOGASTRODUODENOSCOPY (EGD) WITH PROPOFOL N/A 07/29/2017   Procedure: ESOPHAGOGASTRODUODENOSCOPY (EGD) WITH PROPOFOL;  Surgeon: Manya Silvas, MD;  Location: [facility] ENDOSCOPY;  Service: Endoscopy;  Laterality: N/A;  . FLEXIBLE SIGMOIDOSCOPY    . GASTRIC BYPASS    . NEPHRECTOMY    . open reduction and internal fixation, right distal radius    . right knee arthroscopy    . UPPER GI ENDOSCOPY      SOCIAL HISTORY:   Social History   Tobacco Use  . Smoking status: Former Smoker    Packs/day: 1.00    Years: 30.00    Pack years: 30.00  . Smokeless tobacco: Current User    Types: Chew  Substance Use Topics  . Alcohol use: No    Frequency: Never    FAMILY HISTORY:   Family History  Problem Relation Age of Onset  . Diabetes Mother   . Cancer Mother   . Heart disease Father     DRUG ALLERGIES:  No Known Allergies  REVIEW OF SYSTEMS:   ROS  Unable to obtain from patient drifting to sleep easily MEDICATIONS AT HOME:   Prior to Admission medications   Medication Sig  Start Date End Date Taking? Authorizing Provider  albuterol (VENTOLIN HFA) 108 (90 Base) MCG/ACT inhaler Inhale 2 puffs into the lungs every 4 (four) hours as needed for wheezing or shortness of breath.   Yes [provider]  ANORO ELLIPTA 62.5-25 MCG/INH AEPB Inhale 1 puff into the lungs daily.  05/18/19  Yes [provider]  apixaban (ELIQUIS) 5 MG TABS tablet Take 5 mg by mouth every 12 (twelve) hours.    Yes [provider]  atorvastatin (LIPITOR) 10 MG tablet Take 10 mg by mouth daily.   Yes [provider]  carvedilol (COREG) 6.25 MG tablet Take 6.25  mg by mouth 2 (two) times daily with a meal.   Yes [provider]  DULoxetine (CYMBALTA) 30 MG capsule Take 30 mg by mouth daily.   Yes [provider]  enalapril (VASOTEC) 20 MG tablet Take 20 mg by mouth daily.   Yes [provider]  fluticasone (FLONASE) 50 MCG/ACT nasal spray Place 1 spray into both nostrils 2 (two) times a day.   Yes [provider]  furosemide (LASIX) 40 MG tablet Take 40 mg by mouth 2 (two) times daily.    Yes [provider]  gabapentin (NEURONTIN) 300 MG capsule Take 300 mg by mouth at bedtime.    Yes [provider]  glipiZIDE (GLUCOTROL XL) 10 MG 24 hr tablet Take 10 mg by mouth daily with breakfast.   Yes [provider]  Multiple Vitamin (MULTIVITAMIN) tablet Take 1 tablet by mouth daily.   Yes [provider]  pantoprazole (PROTONIX) 40 MG tablet Take 40 mg by mouth 2 (two) times daily.    Yes [provider]  PARoxetine (PAXIL) 40 MG tablet Take 40 mg by mouth every morning.    Yes [provider]  potassium chloride SA (K-DUR,KLOR-CON) 20 MEQ tablet Take 20 mEq by mouth daily.  02/22/18 07/05/19 Yes [provider]  pramipexole (MIRAPEX) 1 MG tablet Take 1 mg by mouth 2 (two) times a day.  05/11/19  Yes [provider]  spironolactone (ALDACTONE) 25 MG tablet Take 25 mg by mouth daily.  02/22/18 07/05/19 Yes [provider]  sucralfate (CARAFATE) 1 g tablet Take 1 g by mouth 3 (three) times daily.    Yes [provider]  tiZANidine (ZANAFLEX) 2 MG tablet Take 1 tablet (2 mg total) by mouth every 8 (eight) hours as needed for muscle spasms. 06/08/19 07/18/19 Yes Gillis Santa, MD  traMADol (ULTRAM) 50 MG tablet Take 50 mg by mouth every 6 (six) hours as needed.   Yes [provider]     VITAL SIGNS:  Blood pressure (!) 120/52, pulse 62, temperature 98 F (36.7 C), temperature source Oral, resp. rate 19, height 6' (1.829 m), weight 127.6  kg, SpO2 96 %.  PHYSICAL EXAMINATION:   Physical Exam  GENERAL:  73 y.o.-year-old patient lying in the bed with no acute distress.  EYES: Pupils equal, round, reactive to light and accommodation. No scleral icterus. Extraocular muscles intact.  HEENT: Head atraumatic, normocephalic. Oropharynx and nasopharynx clear.  NECK:  Supple, no jugular venous distention. No thyroid enlargement, no tenderness.  LUNGS: Normal breath sounds bilaterally, no wheezing, rales,rhonchi or crepitation. No use of accessory muscles of respiration.  CARDIOVASCULAR: S1, S2 normal. No murmurs, rubs, or gallops.  ABDOMEN: Soft, nontender, nondistended. Bowel sounds present. No organomegaly or mass.  EXTREMITIES: Bilateral edema of lower extremities. No cyanosis, or clubbing. No rash or lesions. + pedal pulses MUSCULOSKELETAL:  Normal bulk, and power was 5+ grip and elbow, knee, and ankle flexion and extension bilaterally.  NEUROLOGIC:Alert and oriented x 3. CN 2-12 intact. Sensation to light touch and cold stimuli intact bilaterally. Babinski is downgoing. DTR's (biceps, patellar, and achilles) 2+ and symmetric throughout. Gait not tested due to safety concern. PSYCHIATRIC: The patient is alert and oriented x 3.  SKIN: No obvious rash, lesion, or ulcer.   DATA REVIEWED:  LABORATORY PANEL:   CBC Recent Labs  Lab 07/05/19 1058  WBC 5.5  HGB 9.6*  HCT 32.9*  PLT 148*   ------------------------------------------------------------------------------------------------------------------  Chemistries  Recent Labs  Lab 07/05/19 1058 07/05/19 1530  NA 140 141  K >7.5* >7.5*  CL 123* 124*  CO2 13* 13*  GLUCOSE 132* 121*  BUN 83* 80*  CREATININE 2.65* 2.54*  CALCIUM 8.5* 8.4*  MG  --  2.2  AST 27  --   ALT 35  --   ALKPHOS 120  --   BILITOT 0.4  --    ------------------------------------------------------------------------------------------------------------------  Cardiac Enzymes No results for  input(s): TROPONINI in the last 168 hours. ------------------------------------------------------------------------------------------------------------------  RADIOLOGY:  No results found.  EKG:  EKG: normal EKG, normal sinus rhythm, unchanged from previous tracings, RBBB, PAC's noted. Vent. rate 98 BPM PR interval 186 ms QRS duration 146 ms QT/QTc 376/480 ms P-R-T axes 27 37 23 IMPRESSION AND PLAN:   73 y.o. male with pertinent past medical history of COPD, PVD with lymphedema, anemia and chronic kidney disease, iron deficiency anemia, hypertension, type 2 diabetes mellitus with complication, DVT, CKD, renal cell carcinoma, OSA, anxiety and depression, and chronic back pain presenting to the ED per his nephrology request for hyperkalemia.  1. Hyperkalemia - Patient with hx of CKD and renal cell carcinoma - Received IV insulin, Dextrose and Lokelma in the ED - Admit to medsurg unit - Hold spironolactone, enalapril - We will recheck K and magnesium every 6 hours and treat - Nephrology consult. Discussed with Dr Holley Raring   2. Acute on Chronic CKD - BUN/Cr above baseline - Hold Lasix and ACE inhibitors - Avoid nephrotoxins - Continue to monitor renal function - Nephrology consult  3. HLD  + Goal LDL<100 - Atorvastatin 10mg  PO qhs  4. HTN  + Goal BP <140/80 - ACE-Inhibitor: holding Enalapril due to hyperkalemia  5. DM  -Hold Glipizide - start SSI  6. Hx of DVT - On Eliquis  7. Anemia of CKD:  Hgb stable at 9.6. Treated with Epogen   8. Chronic back pain - Follows with Dr. Gillis Santa for pain management - Will continue Gabapentin and Cymbalta, Tizanidine   All the records are reviewed and case discussed with ED provider. Management plans discussed with the patient, family and they are in agreement.  CODE STATUS: FULL  TOTAL TIME TAKING CARE OF THIS PATIENT: 61minutes.    on 07/05/2019 at 5:09 PM  Rufina Falco, DNP, FNP-BC [facility] Nurse Practitioner  Between 7am to 6pm - Pager [phone]  After 6pm go to [email or web address] - password [email or web address] [facility]  Office  [phone]  CC: Primary care physician; Derinda Late, MD

## 2019-07-05 NOTE — ED Notes (Signed)
Pt drowsy, states he doesn't rest well at night and sleeps mostly during the day. Pt spouse confirmed this when we spoke on the phone

## 2019-07-05 NOTE — ED Notes (Signed)
Pt speaking with spouse at this time on the phone

## 2019-07-05 NOTE — Progress Notes (Signed)
Advanced care plan. Purpose of the Encounter: CODE STATUS Parties in Attendance: Patient Patient's Decision Capacity: Good Subjective/Patient's story: 72 y.o. male with pertinent past medical history of COPD, PVD with lymphedema, anemia and chronic kidney disease, iron deficiency anemia, hypertension, type 2 diabetes mellitus with complication, DVT, CKD, renal cell carcinoma, OSA, anxiety and depression, and chronic back pain presenting to the ED per his nephrology request for hyperkalemia. Objective/Medical story Patient needs treatment for hyperkalemia and further evaluation by nephrology.  He received IV insulin medication, dextrose and Lokelma for elevated potassium level.  His ACE inhibitor and Aldactone will be held. Goals of care determination:  Advance care directives goals of care and treatment plan discussed Patient wants everything done which includes CPR, intubation ventilator if the need arises CODE STATUS: Full code Time spent discussing advanced care planning: 16 minutes

## 2019-07-05 NOTE — ED Notes (Addendum)
Pt given graham crackers, peanut butter, and soda to eat for glucose 66 per MD Paduchowski . Will recheck in 31min

## 2019-07-05 NOTE — ED Notes (Signed)
ED TO INPATIENT HANDOFF REPORT  ED Nurse Name and Phone #: [place] [phone]  S Name/Age/Gender [name] 73 y.o. male Room/Bed: ED14A/ED14A  Code Status   Code Status: Full Code  Home/SNF/Other Home Patient oriented to: self, place, time and situation Is this baseline? Yes   Triage Complete: Triage complete  Chief Complaint high potassium  Triage Note Patient presents to ED via POV from home. Patient was called by his PCP office and told to come to ED after abnormal lab results. Patient reports "I dont know what it was but they told me by potassium was high and my kidney function was messed up. I only have one kidney". 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    Allergies No Known Allergies  Level of Care/Admitting Diagnosis ED Disposition    ED Disposition Condition [facility] Area: [place] [100120]  Level of Care: Telemetry [5]  Covid Evaluation: Person Under Investigation (PUI)  Diagnosis: Hyperkalemia [017793]  Admitting Physician: Eula Flax  Attending Physician: Rufina Falco ACHIENG [phone]  Estimated length of stay: past midnight tomorrow  Certification:: I certify this patient will need inpatient services for at least 2 midnights  PT Class (Do Not Modify): Inpatient [101]  PT Acc Code (Do Not Modify): Private [1]       B Medical/Surgery History Past Medical History:  Diagnosis Date  . Chronic back pain   . COPD (chronic obstructive pulmonary disease) ([place])   . Depression   . Diabetes mellitus without complication ([place])   . Hyperlipidemia   . Hypertension   . Renal cell carcinoma ([place])   . Sleep apnea    Past Surgical History:  Procedure Laterality Date  . CARPAL TUNNEL RELEASE    . CHOLECYSTECTOMY    . COLONOSCOPY WITH PROPOFOL N/A 07/29/2017   Procedure: COLONOSCOPY WITH PROPOFOL;  Surgeon: Manya Silvas, MD;  Location: [facility]  ENDOSCOPY;  Service: Endoscopy;  Laterality: N/A;  . colonoscopys    . ESOPHAGOGASTRODUODENOSCOPY (EGD) WITH PROPOFOL N/A 07/29/2017   Procedure: ESOPHAGOGASTRODUODENOSCOPY (EGD) WITH PROPOFOL;  Surgeon: Manya Silvas, MD;  Location: [facility] ENDOSCOPY;  Service: Endoscopy;  Laterality: N/A;  . FLEXIBLE SIGMOIDOSCOPY    . GASTRIC BYPASS    . NEPHRECTOMY    . open reduction and internal fixation, right distal radius    . right knee arthroscopy    . UPPER GI ENDOSCOPY       A IV Location/Drains/Wounds Patient Lines/Drains/Airways Status   Active Line/Drains/Airways    Name:   Placement date:   Placement time:   Site:   Days:   Peripheral IV 07/05/19 Left Antecubital   07/05/19    1049    Antecubital   less than 1   Epidural Catheter 06/08/19   06/08/19    0928     27          Intake/Output Last 24 hours No intake or output data in the 24 hours ending 07/05/19 1422  Labs/Imaging Results for orders placed or performed during the hospital encounter of 07/05/19 (from the past 48 hour(s))  CBC     Status: Abnormal   Collection Time: 07/05/19 10:58 AM  Result Value Ref Range   WBC 5.5 4.0 - 10.5 K/uL   RBC 3.30 (L) 4.22 - 5.81 MIL/uL   Hemoglobin 9.6 (L) 13.0 - 17.0 g/dL   HCT 32.9 (L) 39.0 - 52.0 %   MCV 99.7  80.0 - 100.0 fL   MCH 29.1 26.0 - 34.0 pg   MCHC 29.2 (L) 30.0 - 36.0 g/dL   RDW 15.8 (H) 11.5 - 15.5 %   Platelets 148 (L) 150 - 400 K/uL   nRBC 0.0 0.0 - 0.2 %    Comment: Performed at [facility], [address], [place], [place] [address]  Comprehensive metabolic panel     Status: Abnormal   Collection Time: 07/05/19 10:58 AM  Result Value Ref Range   Sodium 140 135 - 145 mmol/L   Potassium >7.5 (HH) 3.5 - 5.1 mmol/L    Comment: RESULT CONFIRMED BY MANUAL DILUTION...[place] CRITICAL RESULT CALLED TO, READ BACK BY AND VERIFIED WITH [place] Delshon Blanchfield AT 1146 ON 07/05/2019 [place]. CORRECTED ON 07/21 AT 1150: PREVIOUSLY REPORTED AS 7.7 RESULT CONFIRMED BY MANUAL  DILUTION...[place] CRITICAL RESULT CALLED TO, READ BACK BY AND VERIFIED WITH [place] Brianna Esson AT 1146 ON 07/05/2019 [place].    Chloride 123 (H) 98 - 111 mmol/L   CO2 13 (L) 22 - 32 mmol/L   Glucose, Bld 132 (H) 70 - 99 mg/dL   BUN 83 (H) 8 - 23 mg/dL   Creatinine, Ser 2.65 (H) 0.61 - 1.24 mg/dL   Calcium 8.5 (L) 8.9 - 10.3 mg/dL   Total Protein 7.1 6.5 - 8.1 g/dL   Albumin 3.8 3.5 - 5.0 g/dL   AST 27 15 - 41 U/L   ALT 35 0 - 44 U/L   Alkaline Phosphatase 120 38 - 126 U/L   Total Bilirubin 0.4 0.3 - 1.2 mg/dL   GFR calc non Af Amer 23 (L) >60 mL/min   GFR calc Af Amer 27 (L) >60 mL/min   Anion gap 4 (L) 5 - 15    Comment: Performed at [facility], [address] [place], [place] [address]  Glucose, capillary     Status: Abnormal   Collection Time: 07/05/19 12:03 PM  Result Value Ref Range   Glucose-Capillary 117 (H) 70 - 99 mg/dL  Glucose, capillary     Status: Abnormal   Collection Time: 07/05/19  1:04 PM  Result Value Ref Range   Glucose-Capillary 66 (L) 70 - 99 mg/dL  Glucose, capillary     Status: None   Collection Time: 07/05/19  1:56 PM  Result Value Ref Range   Glucose-Capillary 90 70 - 99 mg/dL   No results found.  Pending Labs Unresulted Labs (From admission, onward)    Start     Ordered   07/05/19 1500  Magnesium  Once-Timed,   STAT     07/05/19 1256   07/05/19 0981  Basic metabolic panel  Once-Timed,   STAT     07/05/19 1256   07/05/19 1300  SARS Coronavirus 2 (CEPHEID - Performed in [place] hospital lab), Hosp Order  (Asymptomatic Patients Labs)  ONCE - STAT,   STAT    Question:  Rule Out  Answer:  Yes   07/05/19 1259   07/05/19 1256  Osmolality, urine  Add-on,   AD     07/05/19 1256   07/05/19 1256  Sodium, urine, random  Add-on,   AD     07/05/19 1256   07/05/19 1255  HIV antibody (Routine Testing)  Once,   STAT     07/05/19 1256          Vitals/Pain Today's Vitals   07/05/19 1132 07/05/19 1135 07/05/19 1230 07/05/19 1236  BP:   (!) 112/48    Pulse: 64   65  Resp: 19   16  Temp:      TempSrc:      SpO2: 97%   100%  Weight:      Height:      PainSc:  8       Isolation Precautions No active isolations  Medications Medications  traMADol (ULTRAM) tablet 50 mg (has no administration in time range)  atorvastatin (LIPITOR) tablet 10 mg (has no administration in time range)  carvedilol (COREG) tablet 6.25 mg (has no administration in time range)  furosemide (LASIX) tablet 40 mg (has no administration in time range)  DULoxetine (CYMBALTA) DR capsule 30 mg (has no administration in time range)  PARoxetine (PAXIL) tablet 40 mg (has no administration in time range)  pantoprazole (PROTONIX) EC tablet 40 mg (has no administration in time range)  sucralfate (CARAFATE) tablet 1 g (has no administration in time range)  apixaban (ELIQUIS) tablet 5 mg (has no administration in time range)  gabapentin (NEURONTIN) capsule 300 mg (has no administration in time range)  pramipexole (MIRAPEX) tablet 1 mg (has no administration in time range)  tiZANidine (ZANAFLEX) tablet 2 mg (has no administration in time range)  multivitamin with minerals tablet 1 tablet (has no administration in time range)  fluticasone (FLONASE) 50 MCG/ACT nasal spray 1 spray (has no administration in time range)  albuterol (VENTOLIN HFA) 108 (90 Base) MCG/ACT inhaler 2 puff (has no administration in time range)  umeclidinium-vilanterol (ANORO ELLIPTA) 62.5-25 MCG/INH 1 puff (has no administration in time range)  sodium chloride 0.9 % bolus 1,000 mL (1,000 mLs Intravenous New Bag/Given 07/05/19 1203)  insulin aspart (novoLOG) injection 10 Units (10 Units Intravenous Given 07/05/19 1206)  dextrose 50 % solution 25 g (25 g Intravenous Given 07/05/19 1203)  sodium zirconium cyclosilicate (LOKELMA) packet 10 g (10 g Oral Given 07/05/19 1236)    Mobility walks High fall risk   Focused Assessments Cardiac Assessment Handoff:  Cardiac Rhythm: Normal sinus rhythm Lab  Results  Component Value Date   TROPONINI < 0.02 06/22/2014   No results found for: DDIMER Does the Patient currently have chest pain? No     R Recommendations: See Admitting Provider Note  Report given to:   Additional Notes:

## 2019-07-05 NOTE — ED Notes (Signed)
Spoke with spouse outside of lobby at this time and made aware of pt admission and status per pt consent. Spouse verbalizes understanding and will call later to check on pt.

## 2019-07-06 ENCOUNTER — Inpatient Hospital Stay: Payer: Medicare Other

## 2019-07-06 ENCOUNTER — Other Ambulatory Visit: Payer: Self-pay

## 2019-07-06 LAB — BASIC METABOLIC PANEL
Anion gap: 3 — ABNORMAL LOW (ref 5–15)
Anion gap: 5 (ref 5–15)
BUN: 71 mg/dL — ABNORMAL HIGH (ref 8–23)
BUN: 62 mg/dL — ABNORMAL HIGH (ref 8–23)
CO2: 15 mmol/L — ABNORMAL LOW (ref 22–32)
CO2: 15 mmol/L — ABNORMAL LOW (ref 22–32)
Calcium: 8.5 mg/dL — ABNORMAL LOW (ref 8.9–10.3)
Calcium: 8.4 mg/dL — ABNORMAL LOW (ref 8.9–10.3)
Chloride: 125 mmol/L — ABNORMAL HIGH (ref 98–111)
Chloride: 121 mmol/L — ABNORMAL HIGH (ref 98–111)
Creatinine, Ser: 2.08 mg/dL — ABNORMAL HIGH (ref 0.61–1.24)
Creatinine, Ser: 2.18 mg/dL — ABNORMAL HIGH (ref 0.61–1.24)
GFR calc Af Amer: 36 mL/min — ABNORMAL LOW (ref >60)
GFR calc Af Amer: 34 mL/min — ABNORMAL LOW (ref >60)
GFR calc non Af Amer: 31 mL/min — ABNORMAL LOW (ref >60)
GFR calc non Af Amer: 30 mL/min — ABNORMAL LOW (ref >60)
Glucose, Bld: 112 mg/dL — ABNORMAL HIGH (ref 70–99)
Glucose, Bld: 167 mg/dL — ABNORMAL HIGH (ref 70–99)
Potassium: 7 mmol/L (ref 3.5–5.1)
Potassium: 6.4 mmol/L (ref 3.5–5.1)
Sodium: 143 mmol/L (ref 135–145)
Sodium: 141 mmol/L (ref 135–145)

## 2019-07-06 LAB — GLUCOSE, CAPILLARY
Glucose-Capillary: 89 mg/dL (ref 70–99)
Glucose-Capillary: 130 mg/dL — ABNORMAL HIGH (ref 70–99)
Glucose-Capillary: 166 mg/dL — ABNORMAL HIGH (ref 70–99)
Glucose-Capillary: 143 mg/dL — ABNORMAL HIGH (ref 70–99)
Glucose-Capillary: 120 mg/dL — ABNORMAL HIGH (ref 70–99)

## 2019-07-06 LAB — HEMOGLOBIN A1C
Hgb A1c MFr Bld: 6.4 % — ABNORMAL HIGH (ref 4.8–5.6)
Mean Plasma Glucose: 136.98 mg/dL

## 2019-07-06 LAB — NA AND K (SODIUM & POTASSIUM), RAND UR
Potassium Urine: 14 mmol/L
Sodium, Ur: 87 mmol/L

## 2019-07-06 LAB — HIV ANTIBODY (ROUTINE TESTING W REFLEX): HIV Screen 4th Generation wRfx: NONREACTIVE

## 2019-07-06 LAB — OSMOLALITY, URINE: Osmolality, Ur: 407 mOsm/kg (ref 300–900)

## 2019-07-06 MED ORDER — INSULIN ASPART 100 UNIT/ML IV SOLN
10.0000 [IU] | Freq: Once | INTRAVENOUS | Status: AC
  Administered 2019-07-06: 10 [IU] via INTRAVENOUS
  Filled 2019-07-06: qty 0.1

## 2019-07-06 MED ORDER — SODIUM ZIRCONIUM CYCLOSILICATE 10 G PO PACK
10.0000 g | PACK | Freq: Three times a day (TID) | ORAL | Status: DC
  Administered 2019-07-06 – 2019-07-08 (×7): 10 g via ORAL
  Filled 2019-07-06 (×9): qty 1

## 2019-07-06 MED ORDER — STERILE WATER FOR INJECTION IV SOLN
INTRAVENOUS | Status: DC
  Administered 2019-07-06 – 2019-07-07 (×2): via INTRAVENOUS
  Filled 2019-07-06 (×5): qty 850

## 2019-07-06 MED ORDER — SODIUM ZIRCONIUM CYCLOSILICATE 10 G PO PACK
10.0000 g | PACK | ORAL | Status: AC
  Administered 2019-07-06: 10 g via ORAL
  Filled 2019-07-06: qty 1

## 2019-07-06 MED ORDER — SODIUM BICARBONATE 8.4 % IV SOLN
50.0000 meq | Freq: Once | INTRAVENOUS | Status: AC
  Administered 2019-07-06: 50 meq via INTRAVENOUS
  Filled 2019-07-06: qty 50

## 2019-07-06 MED ORDER — CALCIUM GLUCONATE-NACL 1-0.675 GM/50ML-% IV SOLN
1.0000 g | Freq: Once | INTRAVENOUS | Status: AC
  Administered 2019-07-06: 1000 mg via INTRAVENOUS
  Filled 2019-07-06: qty 50

## 2019-07-06 MED ORDER — DEXTROSE 50 % IV SOLN
1.0000 | Freq: Once | INTRAVENOUS | Status: AC
  Administered 2019-07-06: 50 mL via INTRAVENOUS
  Filled 2019-07-06: qty 50

## 2019-07-06 MED ORDER — DEXTROSE 50 % IV SOLN
25.0000 g | Freq: Once | INTRAVENOUS | Status: AC
  Administered 2019-07-06: 25 g via INTRAVENOUS
  Filled 2019-07-06: qty 50

## 2019-07-06 MED ORDER — INSULIN ASPART 100 UNIT/ML ~~LOC~~ SOLN
0.0000 [IU] | Freq: Three times a day (TID) | SUBCUTANEOUS | Status: DC
  Administered 2019-07-06: 2 [IU] via SUBCUTANEOUS
  Administered 2019-07-06 – 2019-07-07 (×3): 1 [IU] via SUBCUTANEOUS
  Administered 2019-07-08: 2 [IU] via SUBCUTANEOUS
  Filled 2019-07-06 (×5): qty 1

## 2019-07-06 MED ORDER — INSULIN ASPART 100 UNIT/ML ~~LOC~~ SOLN: 0.0000 [IU] | Freq: Every day | SUBCUTANEOUS | Status: DC

## 2019-07-06 MED ORDER — ALBUTEROL SULFATE (2.5 MG/3ML) 0.083% IN NEBU
10.0000 mg | INHALATION_SOLUTION | Freq: Once | RESPIRATORY_TRACT | Status: AC
  Administered 2019-07-06: 04:00:00 10 mg via RESPIRATORY_TRACT
  Filled 2019-07-06: qty 12

## 2019-07-06 MED ORDER — SODIUM CHLORIDE 0.9 % IV SOLN
INTRAVENOUS | Status: DC
  Administered 2019-07-06: 10:00:00 via INTRAVENOUS

## 2019-07-06 NOTE — Plan of Care (Signed)
  Problem: Fluid Volume: Goal: Compliance with measures to maintain balanced fluid volume will improve Outcome: Progressing   Problem: Clinical Measurements: Goal: Complications related to the disease process, condition or treatment will be avoided or minimized Outcome: Progressing   Problem: Education: Goal: Knowledge of General Education information will improve Description: Including pain rating scale, medication(s)/side effects and non-pharmacologic comfort measures Outcome: Progressing   Problem: Health Behavior/Discharge Planning: Goal: Ability to manage health-related needs will improve Outcome: Progressing   Problem: Clinical Measurements: Goal: Ability to maintain clinical measurements within normal limits will improve Outcome: Not Progressing Note: Patient's K+ extremely elevated at 7. See MAR for specific medications administered. Will monitor lab values for the remainder of the shift. Wenda Low [facility]

## 2019-07-06 NOTE — Progress Notes (Addendum)
[facility]  ROUNDING NOTE   Subjective:  Patient admitted to [facility] under our instruction for severe hyperkalemia. He has received multiple doses of Lokelma as well as insulin and glucose. Potassium now down to 7.0. Patient has also developed acute renal failure and was also on spironolactone at home. Patient developed lower extremity weakness at home.  Objective:  Vital signs in last 24 hours:  Temp:  [98 F (36.7 C)-98.4 F (36.9 C)] 98 F (36.7 C) (07/22 0731) Pulse Rate:  [62-65] 62 (07/22 0731) Resp:  [16-20] 19 (07/22 0731) BP: (107-123)/(44-63) 117/59 (07/22 0731) SpO2:  [96 %-100 %] 100 % (07/22 0731) Weight:  [127.6 kg] 127.6 kg (07/21 1519)  Weight change:  Filed Weights   07/05/19 1032 07/05/19 1519  Weight: 127 kg 127.6 kg    Intake/Output: I/O last 3 completed shifts: In: 240 [P.O.:240] Out: 3090 [Urine:3090]   Intake/Output this shift:  Total I/O In: -  Out: 1110 [Urine:1110]  Physical Exam: General: No acute distress  Head: Normocephalic, atraumatic. Moist oral mucosal membranes  Eyes: Anicteric  Neck: Supple, trachea midline  Lungs:  Clear to auscultation, normal effort  Heart: S1S2 no rubs  Abdomen:  Soft, nontender, bowel sounds present  Extremities: Trace peripheral edema.  Neurologic: Awake, alert, following commands  Skin: No lesions       Basic Metabolic Panel: Recent Labs  Lab 07/05/19 1058 07/05/19 1530 07/06/19 0230  NA 140 141 143  K >7.5* >7.5* 7.0*  CL 123* 124* 125*  CO2 13* 13* 15*  GLUCOSE 132* 121* 112*  BUN 83* 80* 71*  CREATININE 2.65* 2.54* 2.08*  CALCIUM 8.5* 8.4* 8.5*  MG  --  2.2  --     Liver Function Tests: Recent Labs  Lab 07/05/19 1058  AST 27  ALT 35  ALKPHOS 120  BILITOT 0.4  PROT 7.1  ALBUMIN 3.8   No results for input(s): LIPASE, AMYLASE in the last 168 hours. No results for input(s): AMMONIA in the last 168 hours.  CBC: Recent Labs  Lab  07/05/19 1058  WBC 5.5  HGB 9.6*  HCT 32.9*  MCV 99.7  PLT 148*    Cardiac Enzymes: No results for input(s): CKTOTAL, CKMB, CKMBINDEX, TROPONINI in the last 168 hours.  BNP: Invalid input(s): POCBNP  CBG: Recent Labs  Lab 07/05/19 1304 07/05/19 1356 07/05/19 1749 07/06/19 0359 07/06/19 0520  GLUCAP 66* 90 104* 89 130*    Microbiology: Results for orders placed or performed during the hospital encounter of 07/05/19  SARS Coronavirus 2 (CEPHEID - Performed in [place] hospital lab), Hosp Order     Status: None   Collection Time: 07/05/19  1:37 PM   Specimen: Nasopharyngeal Swab  Result Value Ref Range Status   SARS Coronavirus 2 NEGATIVE NEGATIVE Final    Comment: (NOTE) If result is NEGATIVE SARS-CoV-2 target nucleic acids are NOT DETECTED. The SARS-CoV-2 RNA is generally detectable in upper and lower  respiratory specimens during the acute phase of infection. The lowest  concentration of SARS-CoV-2 viral copies this assay can detect is 250  copies / mL. A negative result does not preclude SARS-CoV-2 infection  and should not be used as the sole basis for treatment or other  patient management decisions.  A negative result may occur with  improper specimen collection / handling, submission of specimen other  than nasopharyngeal swab, presence of viral mutation(s) within the  areas targeted by this assay, and inadequate number of viral copies  (<  250 copies / mL). A negative result must be combined with clinical  observations, patient history, and epidemiological information. If result is POSITIVE SARS-CoV-2 target nucleic acids are DETECTED. The SARS-CoV-2 RNA is generally detectable in upper and lower  respiratory specimens dur ing the acute phase of infection.  Positive  results are indicative of active infection with SARS-CoV-2.  Clinical  correlation with patient history and other diagnostic information is  necessary to determine patient infection status.   Positive results do  not rule out bacterial infection or co-infection with other viruses. If result is PRESUMPTIVE POSTIVE SARS-CoV-2 nucleic acids MAY BE PRESENT.   A presumptive positive result was obtained on the submitted specimen  and confirmed on repeat testing.  While 2019 novel coronavirus  (SARS-CoV-2) nucleic acids may be present in the submitted sample  additional confirmatory testing may be necessary for epidemiological  and / or clinical management purposes  to differentiate between  SARS-CoV-2 and other Sarbecovirus currently known to infect humans.  If clinically indicated additional testing with an alternate test  methodology [phone]) is advised. The SARS-CoV-2 RNA is generally  detectable in upper and lower respiratory sp ecimens during the acute  phase of infection. The expected result is Negative. Fact Sheet for Patients:  StrictlyIdeas.no Fact Sheet for Healthcare Providers: BankingDealers.co.za This test is not yet approved or cleared by the [place] FDA and has been authorized for detection and/or diagnosis of SARS-CoV-2 by FDA under an Emergency Use Authorization (EUA).  This EUA will remain in effect (meaning this test can be used) for the duration of the COVID-19 declaration under Section 564(b)(1) of the Act, 21 U.S.C. section 360bbb-3(b)(1), unless the authorization is terminated or revoked sooner. Performed at [facility], [address] [place], [place] [address]     Coagulation Studies: No results for input(s): LABPROT, INR in the last 72 hours.  Urinalysis: No results for input(s): COLORURINE, LABSPEC, PHURINE, GLUCOSEU, HGBUR, BILIRUBINUR, KETONESUR, PROTEINUR, UROBILINOGEN, NITRITE, LEUKOCYTESUR in the last 72 hours.  Invalid input(s): APPERANCEUR    Imaging: No results found.   Medications:   . sodium chloride 50 mL/hr at 07/06/19 1005   . apixaban  5 mg Oral Q12H  .  atorvastatin  10 mg Oral Daily  . carvedilol  6.25 mg Oral BID WC  . DULoxetine  30 mg Oral Daily  . fluticasone  1 spray Each Nare BID  . furosemide  40 mg Oral BID  . gabapentin  300 mg Oral QHS  . pantoprazole  40 mg Oral BID  . PARoxetine  40 mg Oral [id number]  . pramipexole  1 mg Oral BID  . sodium zirconium cyclosilicate  10 g Oral TID  . sucralfate  1 g Oral TID  . umeclidinium-vilanterol  1 puff Inhalation Daily   albuterol, tiZANidine, traMADol  Assessment/ Plan:  73 y.o. male 73 year old Caucasian male with past medical history of diabetes mellitus, hyperlipidemia, hypertension, renal cell carcinoma status post left nephrectomy in March of 2004, gastric ulcers, COPD, chronic lower back pain, carpal tunnel syndrome, status post gastric bypass in 2010, LLE DVT, right sided PE, hx of right chest tube placement.  1. Acute renal failure/Chronic kidney disease stage III/proteinuria/hyperkalemia.  Patient has now developed acute renal failure.  May be related to diuretics.  Obtain renal ultrasound to make sure there is no underlying obstruction.  Continue IV fluid hydration.  In addition we will maintain the patient on sodium zirconium 10 g p.o. 3 times daily.  As long as  potassium continues to come down no urgent indication for dialysis at the moment however this may be a possibility if potassium does not come down.  2. Hypertension.  Continue carvedilol.  Patient has been taken off of ACE inhibitor and spironolactone..  3. Lower extremity edema.  Significantly improved as compared to when he was in the office.  Discontinue Lasix at this time.  4. Chronic systolic heart failure.  Stop lasix, has already been taken off spinal lactone.  Continue to monitor for signs of worsening heart failure.   LOS: 1 Yazhini Mcaulay 7/22/202011:29 AM

## 2019-07-06 NOTE — Progress Notes (Signed)
Patient ID: [name], male   DOB: June 30, 1946, 72 y.o.   MRN: [id number]  [facility] PROGRESS NOTE  [name] OZH:[id number] DOB: 1946-06-29 DOA: 07/05/2019 PCP: Derinda Late, MD  HPI/Subjective: Patient feeling a little bit better.  Had 2 falls at home and his legs have been very weak.  He could hardly stand up.  Was sent in for hyperkalemia.  Objective: Vitals:   07/06/19 0731 07/06/19 1542  BP: (!) 117/59 (!) 101/55  Pulse: 62 63  Resp: 19 19  Temp: 98 F (36.7 C) 97.7 F (36.5 C)  SpO2: 100% 100%    Intake/Output Summary (Last 24 hours) at 07/06/2019 1634 Last data filed at 07/06/2019 1344 Gross per 24 hour  Intake 360 ml  Output 4800 ml  Net -4440 ml   Filed Weights   07/05/19 1032 07/05/19 1519  Weight: 127 kg 127.6 kg    ROS: Review of Systems  Constitutional: Negative for chills and fever.  Eyes: Negative for blurred vision.  Respiratory: Negative for cough and shortness of breath.   Cardiovascular: Negative for chest pain.  Gastrointestinal: Negative for abdominal pain, constipation, diarrhea, nausea and vomiting.  Genitourinary: Negative for dysuria.  Musculoskeletal: Negative for joint pain.  Neurological: Positive for weakness. Negative for dizziness and headaches.   Exam: Physical Exam  Constitutional: He is oriented to person, place, and time.  HENT:  Nose: No mucosal edema.  Mouth/Throat: No oropharyngeal exudate or posterior oropharyngeal edema.  Eyes: Pupils are equal, round, and reactive to light. Conjunctivae, EOM and lids are normal.  Neck: No JVD present. Carotid bruit is not present. No edema present. No thyroid mass and no thyromegaly present.  Cardiovascular: S1 normal and S2 normal. Exam reveals no gallop.  No murmur heard. Pulses:      Dorsalis pedis pulses are 2+ on the right side and 2+ on the left side.  Respiratory: No respiratory distress. He has no wheezes. He has no rhonchi. He has no rales.  GI:  Soft. Bowel sounds are normal. There is no abdominal tenderness.  Musculoskeletal:     Right ankle: He exhibits swelling.     Left ankle: He exhibits swelling.  Lymphadenopathy:    He has no cervical adenopathy.  Neurological: He is alert and oriented to person, place, and time. No cranial nerve deficit.  Skin: Skin is warm. No rash noted. Nails show no clubbing.  Psychiatric: He has a normal mood and affect.      Data Reviewed: Basic Metabolic Panel: Recent Labs  Lab 07/05/19 1058 07/05/19 1530 07/06/19 0230 07/06/19 1557  NA 140 141 143 141  K >7.5* >7.5* 7.0* 6.4*  CL 123* 124* 125* 121*  CO2 13* 13* 15* 15*  GLUCOSE 132* 121* 112* 167*  BUN 83* 80* 71* 62*  CREATININE 2.65* 2.54* 2.08* 2.18*  CALCIUM 8.5* 8.4* 8.5* 8.4*  MG  --  2.2  --   --    Liver Function Tests: Recent Labs  Lab 07/05/19 1058  AST 27  ALT 35  ALKPHOS 120  BILITOT 0.4  PROT 7.1  ALBUMIN 3.8   CBC: Recent Labs  Lab 07/05/19 1058  WBC 5.5  HGB 9.6*  HCT 32.9*  MCV 99.7  PLT 148*     CBG: Recent Labs  Lab 07/05/19 1356 07/05/19 1749 07/06/19 0359 07/06/19 0520 07/06/19 1259  GLUCAP 90 104* 89 130* 166*    Recent Results (from the past 240 hour(s))  SARS Coronavirus 2 (CEPHEID - Performed in  [facility] hospital lab), Hosp Order     Status: None   Collection Time: 07/05/19  1:37 PM   Specimen: Nasopharyngeal Swab  Result Value Ref Range Status   SARS Coronavirus 2 NEGATIVE NEGATIVE Final    Comment: (NOTE) If result is NEGATIVE SARS-CoV-2 target nucleic acids are NOT DETECTED. The SARS-CoV-2 RNA is generally detectable in upper and lower  respiratory specimens during the acute phase of infection. The lowest  concentration of SARS-CoV-2 viral copies this assay can detect is 250  copies / mL. A negative result does not preclude SARS-CoV-2 infection  and should not be used as the sole basis for treatment or other  patient management decisions.  A negative result may occur  with  improper specimen collection / handling, submission of specimen other  than nasopharyngeal swab, presence of viral mutation(s) within the  areas targeted by this assay, and inadequate number of viral copies  (<250 copies / mL). A negative result must be combined with clinical  observations, patient history, and epidemiological information. If result is POSITIVE SARS-CoV-2 target nucleic acids are DETECTED. The SARS-CoV-2 RNA is generally detectable in upper and lower  respiratory specimens dur ing the acute phase of infection.  Positive  results are indicative of active infection with SARS-CoV-2.  Clinical  correlation with patient history and other diagnostic information is  necessary to determine patient infection status.  Positive results do  not rule out bacterial infection or co-infection with other viruses. If result is PRESUMPTIVE POSTIVE SARS-CoV-2 nucleic acids MAY BE PRESENT.   A presumptive positive result was obtained on the submitted specimen  and confirmed on repeat testing.  While 2019 novel coronavirus  (SARS-CoV-2) nucleic acids may be present in the submitted sample  additional confirmatory testing may be necessary for epidemiological  and / or clinical management purposes  to differentiate between  SARS-CoV-2 and other Sarbecovirus currently known to infect humans.  If clinically indicated additional testing with an alternate test  methodology [phone]) is advised. The SARS-CoV-2 RNA is generally  detectable in upper and lower respiratory sp ecimens during the acute  phase of infection. The expected result is Negative. Fact Sheet for Patients:  StrictlyIdeas.no Fact Sheet for Healthcare Providers: BankingDealers.co.za This test is not yet approved or cleared by the [place] FDA and has been authorized for detection and/or diagnosis of SARS-CoV-2 by FDA under an Emergency Use Authorization (EUA).  This EUA  will remain in effect (meaning this test can be used) for the duration of the COVID-19 declaration under Section 564(b)(1) of the Act, 21 U.S.C. section 360bbb-3(b)(1), unless the authorization is terminated or revoked sooner. Performed at [facility], [address], [place], [place] [address]      Studies: US Renal  Result Date: 07/06/2019 CLINICAL DATA:  Acute renal failure. EXAM: RENAL / URINARY TRACT ULTRASOUND COMPLETE COMPARISON:  CT 01/27/2014 FINDINGS: Right Kidney: Renal measurements: 13.1 x 6.6 x 7.4 cm = volume: 334 mL . Echogenicity within normal limits. No mass or hydronephrosis visualized. 2.5 cm cyst over the upper pole. Left Kidney: Previous left nephrectomy. Bladder: Appears normal for degree of bladder distention. Right-sided ureteral jet visualized. IMPRESSION: Normal size right kidney without hydronephrosis. 2.5 cm cyst over the upper pole. Previous left nephrectomy. Electronically Signed   By: Marin Olp M.D.   On: 07/06/2019 15:44    Scheduled Meds: . apixaban  5 mg Oral Q12H  . atorvastatin  10 mg Oral Daily  . carvedilol  6.25 mg Oral BID  WC  . DULoxetine  30 mg Oral Daily  . fluticasone  1 spray Each Nare BID  . gabapentin  300 mg Oral QHS  . insulin aspart  0-5 Units Subcutaneous QHS  . insulin aspart  0-9 Units Subcutaneous TID WC  . insulin aspart  10 Units Intravenous Once  . pantoprazole  40 mg Oral BID  . PARoxetine  40 mg Oral BH-q7a  . pramipexole  1 mg Oral BID  . sodium bicarbonate  50 mEq Intravenous Once  . sodium zirconium cyclosilicate  10 g Oral TID  . sucralfate  1 g Oral TID  . umeclidinium-vilanterol  1 puff Inhalation Daily   Continuous Infusions: . calcium gluconate    . dextrose    .  sodium bicarbonate (isotonic) infusion in sterile water 50 mL/hr at 07/06/19 1301    Assessment/Plan:   1. Hyperkalemia with a potassium of 7.0 this morning.  Patient given Lokelma.  Repeat potassium this afternoon 6.4.  Low, ordered  3 times a day.  I reordered calcium gluconate, sodium bicarb, insulin and D50. 2. Acute kidney injury on chronic kidney disease stage III.  Patient started on IV fluid hydration. 3. Hypertension on Coreg.  Medications that can cause acute kidney disease 4. Chronic pain continue his usual medications 5. History of renal cell carcinoma 6. Type 2 diabetes mellitus put on sliding scale 7. History of DVT on Eliquis 8. Hyperlipidemia unspecified on atorvastatin  Code Status:     Code Status Orders  (From admission, onward)         Start     Ordered   07/05/19 1254  Full code  Continuous     07/05/19 1256        Code Status History    This patient has a current code status but no historical code status.   Advance Care Planning Activity      Disposition Plan: To be determined  Time spent: 28 minutes  [place]

## 2019-07-07 ENCOUNTER — Inpatient Hospital Stay: Payer: Medicare Other

## 2019-07-07 ENCOUNTER — Telehealth: Payer: Medicare Other | Admitting: Student in an Organized Health Care Education/Training Program

## 2019-07-07 LAB — BASIC METABOLIC PANEL
Anion gap: 5 (ref 5–15)
BUN: 59 mg/dL — ABNORMAL HIGH (ref 8–23)
CO2: 17 mmol/L — ABNORMAL LOW (ref 22–32)
Calcium: 8.3 mg/dL — ABNORMAL LOW (ref 8.9–10.3)
Chloride: 123 mmol/L — ABNORMAL HIGH (ref 98–111)
Creatinine, Ser: 1.86 mg/dL — ABNORMAL HIGH (ref 0.61–1.24)
GFR calc Af Amer: 42 mL/min — ABNORMAL LOW (ref >60)
GFR calc non Af Amer: 36 mL/min — ABNORMAL LOW (ref >60)
Glucose, Bld: 130 mg/dL — ABNORMAL HIGH (ref 70–99)
Potassium: 5.8 mmol/L — ABNORMAL HIGH (ref 3.5–5.1)
Sodium: 145 mmol/L (ref 135–145)

## 2019-07-07 LAB — GLUCOSE, CAPILLARY
Glucose-Capillary: 131 mg/dL — ABNORMAL HIGH (ref 70–99)
Glucose-Capillary: 150 mg/dL — ABNORMAL HIGH (ref 70–99)
Glucose-Capillary: 119 mg/dL — ABNORMAL HIGH (ref 70–99)
Glucose-Capillary: 140 mg/dL — ABNORMAL HIGH (ref 70–99)

## 2019-07-07 NOTE — Progress Notes (Signed)
Patient up to the sink during shift change report. Mentioned turning the bed alarm on since patient has fallen at home in the last 6 months. Patient requested for the alarm not to be turned on because he is a lot better now. He has a fall because of swelling and weakness which are now resolving. Educated patient on the importance of calling out to assist him walk any further.

## 2019-07-07 NOTE — TOC Initial Note (Signed)
Transition of Care [facility]) - Initial/Assessment Note    Patient Details  Name: [name] MRN: [id number] Date of Birth: [birth year]  Transition of Care [facility]) CM/SW Contact:    Katrina Stack, RN Phone Number: 07/07/2019, 5:36 PM  Clinical Narrative:                There is no consult ordered.  Patient has high risk score. This CM was not able to assess patient today          Patient Goals and CMS Choice        Expected Discharge Plan and Services                                                Prior Living Arrangements/Services                       Activities of Daily Living Home Assistive Devices/Equipment: Gilford Rile (specify type) ADL Screening (condition at time of admission) Patient's cognitive ability adequate to safely complete daily activities?: Yes Is the patient deaf or have difficulty hearing?: Yes Does the patient have difficulty seeing, even when wearing glasses/contacts?: No Does the patient have difficulty concentrating, remembering, or making decisions?: No Patient able to express need for assistance with ADLs?: Yes Does the patient have difficulty dressing or bathing?: No Independently performs ADLs?: Yes (appropriate for developmental age) Does the patient have difficulty walking or climbing stairs?: Yes Weakness of Legs: Both Weakness of Arms/Hands: None  Permission Sought/Granted                  Emotional Assessment              Admission diagnosis:  Hyperkalemia [E87.5] Acute renal failure, unspecified acute renal failure type [facility]) [N17.9] Patient Active Problem List   Diagnosis Date Noted  . Hyperkalemia 07/05/2019  . Lumbar degenerative disc disease 05/11/2019  . Spinal stenosis, lumbar region, with neurogenic claudication 05/11/2019  . Ankylosis of lumbar spine 05/11/2019  . Neuroforaminal stenosis of lumbar spine (left, L5/S1) 05/11/2019  . Lumbar facet arthropathy 05/11/2019  . Chronic pain  syndrome 05/11/2019  . Chronic obstructive pulmonary disease ([place]) 01/03/2019  . Atherosclerotic peripheral vascular disease ([place]) 09/01/2018  . Lymphedema 06/22/2018  . Iron deficiency anemia 05/01/2018  . Anemia in chronic kidney disease 04/26/2018  . Varicose veins of leg with swelling, bilateral 04/09/2018  . SOB (shortness of breath) on exertion 03/17/2018  . Bilateral lower extremity edema 02/23/2018  . Lower extremity pain, bilateral 02/23/2018  . Essential hypertension 02/23/2018  . H/O deep venous thrombosis 01/23/2017  . Type II or unspecified type diabetes mellitus without mention of complication, not stated as uncontrolled 06/19/2014  . Chronic kidney disease, unspecified 06/19/2014  . Anxiety state 06/19/2014  . Other and unspecified hyperlipidemia 06/19/2014   PCP:  Derinda Late, MD Pharmacy:   [id number] - [place], [place] [address] [place] [address] Phone: [phone] Fax: [phone]     Social Determinants of Health (SDOH) Interventions    Readmission Risk Interventions No flowsheet data found.

## 2019-07-07 NOTE — Plan of Care (Signed)
  Problem: Fluid Volume: Goal: Compliance with measures to maintain balanced fluid volume will improve Outcome: Progressing   Problem: Clinical Measurements: Goal: Respiratory complications will improve Outcome: Progressing   Problem: Activity: Goal: Risk for activity intolerance will decrease Outcome: Progressing Note: Ambulating in the room with no issues.

## 2019-07-07 NOTE — Progress Notes (Signed)
[facility]  ROUNDING NOTE   Subjective:  Hyperkalemia improved. K down to 5.8 on lokelma.  Cr also improved.  Feeling better.  Objective:  Vital signs in last 24 hours:  Temp:  [97.7 F (36.5 C)-98.1 F (36.7 C)] 97.7 F (36.5 C) (07/23 0749) Pulse Rate:  [63-69] 66 (07/23 0846) Resp:  [16-20] 16 (07/23 0331) BP: (97-115)/(55-60) 110/60 (07/23 0846) SpO2:  [95 %-100 %] 100 % (07/23 0749)  Weight change:  Filed Weights   07/05/19 1032 07/05/19 1519  Weight: 127 kg 127.6 kg    Intake/Output: I/O last 3 completed shifts: In: 1015.3 [P.O.:600; I.V.:415.3] Out: 6285 [Urine:6285]   Intake/Output this shift:  Total I/O In: 240 [P.O.:240] Out: -   Physical Exam: General: No acute distress  Head: Normocephalic, atraumatic. Moist oral mucosal membranes  Eyes: Anicteric  Neck: Supple, trachea midline  Lungs:  Clear to auscultation, normal effort  Heart: S1S2 no rubs  Abdomen:  Soft, nontender, bowel sounds present  Extremities: Trace peripheral edema.  Neurologic: Awake, alert, following commands  Skin: No lesions       Basic Metabolic Panel: Recent Labs  Lab 07/05/19 1058 07/05/19 1530 07/06/19 0230 07/06/19 1557 07/07/19 0448  NA 140 141 143 141 145  K >7.5* >7.5* 7.0* 6.4* 5.8*  CL 123* 124* 125* 121* 123*  CO2 13* 13* 15* 15* 17*  GLUCOSE 132* 121* 112* 167* 130*  BUN 83* 80* 71* 62* 59*  CREATININE 2.65* 2.54* 2.08* 2.18* 1.86*  CALCIUM 8.5* 8.4* 8.5* 8.4* 8.3*  MG  --  2.2  --   --   --     Liver Function Tests: Recent Labs  Lab 07/05/19 1058  AST 27  ALT 35  ALKPHOS 120  BILITOT 0.4  PROT 7.1  ALBUMIN 3.8   No results for input(s): LIPASE, AMYLASE in the last 168 hours. No results for input(s): AMMONIA in the last 168 hours.  CBC: Recent Labs  Lab 07/05/19 1058  WBC 5.5  HGB 9.6*  HCT 32.9*  MCV 99.7  PLT 148*    Cardiac Enzymes: No results for input(s): CKTOTAL, CKMB, CKMBINDEX, TROPONINI in the last 168  hours.  BNP: Invalid input(s): POCBNP  CBG: Recent Labs  Lab 07/06/19 1259 07/06/19 1643 07/06/19 2159 07/07/19 0754 07/07/19 1145  GLUCAP 166* 143* 120* 131* 150*    Microbiology: Results for orders placed or performed during the hospital encounter of 07/05/19  SARS Coronavirus 2 (CEPHEID - Performed in [place] hospital lab), Hosp Order     Status: None   Collection Time: 07/05/19  1:37 PM   Specimen: Nasopharyngeal Swab  Result Value Ref Range Status   SARS Coronavirus 2 NEGATIVE NEGATIVE Final    Comment: (NOTE) If result is NEGATIVE SARS-CoV-2 target nucleic acids are NOT DETECTED. The SARS-CoV-2 RNA is generally detectable in upper and lower  respiratory specimens during the acute phase of infection. The lowest  concentration of SARS-CoV-2 viral copies this assay can detect is 250  copies / mL. A negative result does not preclude SARS-CoV-2 infection  and should not be used as the sole basis for treatment or other  patient management decisions.  A negative result may occur with  improper specimen collection / handling, submission of specimen other  than nasopharyngeal swab, presence of viral mutation(s) within the  areas targeted by this assay, and inadequate number of viral copies  (<250 copies / mL). A negative result must be combined with clinical  observations, patient history, and  epidemiological information. If result is POSITIVE SARS-CoV-2 target nucleic acids are DETECTED. The SARS-CoV-2 RNA is generally detectable in upper and lower  respiratory specimens dur ing the acute phase of infection.  Positive  results are indicative of active infection with SARS-CoV-2.  Clinical  correlation with patient history and other diagnostic information is  necessary to determine patient infection status.  Positive results do  not rule out bacterial infection or co-infection with other viruses. If result is PRESUMPTIVE POSTIVE SARS-CoV-2 nucleic acids MAY BE PRESENT.    A presumptive positive result was obtained on the submitted specimen  and confirmed on repeat testing.  While 2019 novel coronavirus  (SARS-CoV-2) nucleic acids may be present in the submitted sample  additional confirmatory testing may be necessary for epidemiological  and / or clinical management purposes  to differentiate between  SARS-CoV-2 and other Sarbecovirus currently known to infect humans.  If clinically indicated additional testing with an alternate test  methodology [phone]) is advised. The SARS-CoV-2 RNA is generally  detectable in upper and lower respiratory sp ecimens during the acute  phase of infection. The expected result is Negative. Fact Sheet for Patients:  StrictlyIdeas.no Fact Sheet for Healthcare Providers: BankingDealers.co.za This test is not yet approved or cleared by the [place] FDA and has been authorized for detection and/or diagnosis of SARS-CoV-2 by FDA under an Emergency Use Authorization (EUA).  This EUA will remain in effect (meaning this test can be used) for the duration of the COVID-19 declaration under Section 564(b)(1) of the Act, 21 U.S.C. section 360bbb-3(b)(1), unless the authorization is terminated or revoked sooner. Performed at [facility], [address] [place], [place] [address]     Coagulation Studies: No results for input(s): LABPROT, INR in the last 72 hours.  Urinalysis: No results for input(s): COLORURINE, LABSPEC, PHURINE, GLUCOSEU, HGBUR, BILIRUBINUR, KETONESUR, PROTEINUR, UROBILINOGEN, NITRITE, LEUKOCYTESUR in the last 72 hours.  Invalid input(s): APPERANCEUR    Imaging: US Renal  Result Date: 07/06/2019 CLINICAL DATA:  Acute renal failure. EXAM: RENAL / URINARY TRACT ULTRASOUND COMPLETE COMPARISON:  CT 01/27/2014 FINDINGS: Right Kidney: Renal measurements: 13.1 x 6.6 x 7.4 cm = volume: 334 mL . Echogenicity within normal limits. No mass or hydronephrosis  visualized. 2.5 cm cyst over the upper pole. Left Kidney: Previous left nephrectomy. Bladder: Appears normal for degree of bladder distention. Right-sided ureteral jet visualized. IMPRESSION: Normal size right kidney without hydronephrosis. 2.5 cm cyst over the upper pole. Previous left nephrectomy. Electronically Signed   By: Marin Olp M.D.   On: 07/06/2019 15:44     Medications:   .  sodium bicarbonate (isotonic) infusion in sterile water 50 mL/hr at 07/07/19 1249   . apixaban  5 mg Oral Q12H  . atorvastatin  10 mg Oral Daily  . carvedilol  6.25 mg Oral BID WC  . DULoxetine  30 mg Oral Daily  . fluticasone  1 spray Each Nare BID  . gabapentin  300 mg Oral QHS  . insulin aspart  0-5 Units Subcutaneous QHS  . insulin aspart  0-9 Units Subcutaneous TID WC  . pantoprazole  40 mg Oral BID  . PARoxetine  40 mg Oral BH-q7a  . pramipexole  1 mg Oral BID  . sodium zirconium cyclosilicate  10 g Oral TID  . sucralfate  1 g Oral TID  . umeclidinium-vilanterol  1 puff Inhalation Daily   albuterol, tiZANidine, traMADol  Assessment/ Plan:  73 y.o. male 73 year old Caucasian male with past medical history of diabetes  mellitus, hyperlipidemia, hypertension, renal cell carcinoma status post left nephrectomy in March of 2004, gastric ulcers, COPD, chronic lower back pain, carpal tunnel syndrome, status post gastric bypass in 2010, LLE DVT, right sided PE, hx of right chest tube placement.  1. Acute renal failure/Chronic kidney disease stage III/proteinuria/hyperkalemia.  Patient has now developed acute renal failure.  May be related to diuretics.   -Renal US negative for hydronephrosis, off hydration now, will continue to montior renal functino.  2. Hypertension.  Continue carvedilol.  Patient has been taken off of ACE inhibitor and spironolactone.  3. Lower extremity edema.  Continue to hold lasix.  4. Chronic systolic heart failure.  No sign of volume overload at this time, pt off diuretics.     LOS: 2 Candra Wegner 7/23/20201:14 PM

## 2019-07-07 NOTE — Progress Notes (Signed)
Patient ID: Varick Keys, male   DOB: [birth year], 73 y.o.   MRN: [id number]  [facility] PROGRESS NOTE  Viraj Liby AOZ:[id number] DOB: [birth year] DOA: 07/05/2019 PCP: Derinda Late, MD  HPI/Subjective: Patient feeling a lot better and a lot stronger.  His muscle weakness is gone and he is able to walk around.  Feeling much better than when he came in.  Patient told nurse about with a productive cough.  Objective: Vitals:   07/07/19 0749 07/07/19 0846  BP: (!) 97/57 110/60  Pulse: 64 66  Resp:    Temp: 97.7 F (36.5 C)   SpO2: 100%     Intake/Output Summary (Last 24 hours) at 07/07/2019 1459 Last data filed at 07/07/2019 0900 Gross per 24 hour  Intake 1135.31 ml  Output 2225 ml  Net -1089.69 ml   Filed Weights   07/05/19 1032 07/05/19 1519  Weight: 127 kg 127.6 kg    ROS: Review of Systems  Constitutional: Negative for chills and fever.  Eyes: Negative for blurred vision.  Respiratory: Positive for cough. Negative for shortness of breath.   Cardiovascular: Negative for chest pain.  Gastrointestinal: Negative for abdominal pain, constipation, diarrhea, nausea and vomiting.  Genitourinary: Negative for dysuria.  Musculoskeletal: Negative for joint pain.  Neurological: Positive for weakness. Negative for dizziness and headaches.   Exam: Physical Exam  Constitutional: He is oriented to person, place, and time.  HENT:  Nose: No mucosal edema.  Mouth/Throat: No oropharyngeal exudate or posterior oropharyngeal edema.  Eyes: Pupils are equal, round, and reactive to light. Conjunctivae, EOM and lids are normal.  Neck: No JVD present. Carotid bruit is not present. No edema present. No thyroid mass and no thyromegaly present.  Cardiovascular: S1 normal and S2 normal. Exam reveals no gallop.  No murmur heard. Pulses:      Dorsalis pedis pulses are 2+ on the right side and 2+ on the left side.  Respiratory: No respiratory distress. He has no wheezes.  He has no rhonchi. He has no rales.  GI: Soft. Bowel sounds are normal. There is no abdominal tenderness.  Musculoskeletal:     Right ankle: He exhibits swelling.     Left ankle: He exhibits swelling.  Lymphadenopathy:    He has no cervical adenopathy.  Neurological: He is alert and oriented to person, place, and time. No cranial nerve deficit.  Skin: Skin is warm. No rash noted. Nails show no clubbing.  Psychiatric: He has a normal mood and affect.      Data Reviewed: Basic Metabolic Panel: Recent Labs  Lab 07/05/19 1058 07/05/19 1530 07/06/19 0230 07/06/19 1557 07/07/19 0448  NA 140 141 143 141 145  K >7.5* >7.5* 7.0* 6.4* 5.8*  CL 123* 124* 125* 121* 123*  CO2 13* 13* 15* 15* 17*  GLUCOSE 132* 121* 112* 167* 130*  BUN 83* 80* 71* 62* 59*  CREATININE 2.65* 2.54* 2.08* 2.18* 1.86*  CALCIUM 8.5* 8.4* 8.5* 8.4* 8.3*  MG  --  2.2  --   --   --    Liver Function Tests: Recent Labs  Lab 07/05/19 1058  AST 27  ALT 35  ALKPHOS 120  BILITOT 0.4  PROT 7.1  ALBUMIN 3.8   CBC: Recent Labs  Lab 07/05/19 1058  WBC 5.5  HGB 9.6*  HCT 32.9*  MCV 99.7  PLT 148*     CBG: Recent Labs  Lab 07/06/19 1259 07/06/19 1643 07/06/19 2159 07/07/19 0754 07/07/19 1145  GLUCAP 166* 143* 120* 131*  150*    Recent Results (from the past 240 hour(s))  SARS Coronavirus 2 (CEPHEID - Performed in [facility] hospital lab), Hosp Order     Status: None   Collection Time: 07/05/19  1:37 PM   Specimen: Nasopharyngeal Swab  Result Value Ref Range Status   SARS Coronavirus 2 NEGATIVE NEGATIVE Final    Comment: (NOTE) If result is NEGATIVE SARS-CoV-2 target nucleic acids are NOT DETECTED. The SARS-CoV-2 RNA is generally detectable in upper and lower  respiratory specimens during the acute phase of infection. The lowest  concentration of SARS-CoV-2 viral copies this assay can detect is 250  copies / mL. A negative result does not preclude SARS-CoV-2 infection  and should not be  used as the sole basis for treatment or other  patient management decisions.  A negative result may occur with  improper specimen collection / handling, submission of specimen other  than nasopharyngeal swab, presence of viral mutation(s) within the  areas targeted by this assay, and inadequate number of viral copies  (<250 copies / mL). A negative result must be combined with clinical  observations, patient history, and epidemiological information. If result is POSITIVE SARS-CoV-2 target nucleic acids are DETECTED. The SARS-CoV-2 RNA is generally detectable in upper and lower  respiratory specimens dur ing the acute phase of infection.  Positive  results are indicative of active infection with SARS-CoV-2.  Clinical  correlation with patient history and other diagnostic information is  necessary to determine patient infection status.  Positive results do  not rule out bacterial infection or co-infection with other viruses. If result is PRESUMPTIVE POSTIVE SARS-CoV-2 nucleic acids MAY BE PRESENT.   A presumptive positive result was obtained on the submitted specimen  and confirmed on repeat testing.  While 2019 novel coronavirus  (SARS-CoV-2) nucleic acids may be present in the submitted sample  additional confirmatory testing may be necessary for epidemiological  and / or clinical management purposes  to differentiate between  SARS-CoV-2 and other Sarbecovirus currently known to infect humans.  If clinically indicated additional testing with an alternate test  methodology [phone]) is advised. The SARS-CoV-2 RNA is generally  detectable in upper and lower respiratory sp ecimens during the acute  phase of infection. The expected result is Negative. Fact Sheet for Patients:  StrictlyIdeas.no Fact Sheet for Healthcare Providers: BankingDealers.co.za This test is not yet approved or cleared by the [place] FDA and has been authorized  for detection and/or diagnosis of SARS-CoV-2 by FDA under an Emergency Use Authorization (EUA).  This EUA will remain in effect (meaning this test can be used) for the duration of the COVID-19 declaration under Section 564(b)(1) of the Act, 21 U.S.C. section 360bbb-3(b)(1), unless the authorization is terminated or revoked sooner. Performed at [facility], [address], [place], [place] [address]      Studies: US Renal  Result Date: 07/06/2019 CLINICAL DATA:  Acute renal failure. EXAM: RENAL / URINARY TRACT ULTRASOUND COMPLETE COMPARISON:  CT 01/27/2014 FINDINGS: Right Kidney: Renal measurements: 13.1 x 6.6 x 7.4 cm = volume: 334 mL . Echogenicity within normal limits. No mass or hydronephrosis visualized. 2.5 cm cyst over the upper pole. Left Kidney: Previous left nephrectomy. Bladder: Appears normal for degree of bladder distention. Right-sided ureteral jet visualized. IMPRESSION: Normal size right kidney without hydronephrosis. 2.5 cm cyst over the upper pole. Previous left nephrectomy. Electronically Signed   By: Marin Olp M.D.   On: 07/06/2019 15:44    Scheduled Meds: . apixaban  5  mg Oral Q12H  . atorvastatin  10 mg Oral Daily  . carvedilol  6.25 mg Oral BID WC  . DULoxetine  30 mg Oral Daily  . fluticasone  1 spray Each Nare BID  . gabapentin  300 mg Oral QHS  . insulin aspart  0-5 Units Subcutaneous QHS  . insulin aspart  0-9 Units Subcutaneous TID WC  . pantoprazole  40 mg Oral BID  . PARoxetine  40 mg Oral BH-q7a  . pramipexole  1 mg Oral BID  . sodium zirconium cyclosilicate  10 g Oral TID  . sucralfate  1 g Oral TID  . umeclidinium-vilanterol  1 puff Inhalation Daily   Continuous Infusions: .  sodium bicarbonate (isotonic) infusion in sterile water 50 mL/hr at 07/07/19 1249    Assessment/Plan:   1. Hyperkalemia with a potassium of greater than 7.5 when he came in.  Potassium down to 5.8.  Patient on Juab 3 times a day.  Patient was given IV  medications yesterday and on presentation.  Holding enalapril and spironolactone. 2. Acute kidney injury on chronic kidney disease stage III.  Patient's creatinine has improved down to 1.86 with IV fluid hydration.  Looks like his baseline creatinine is around 1.66. 3. Hypertension on Coreg.  Medications that can cause acute kidney disease 4. Chronic pain continue his usual medications 5. History of renal cell carcinoma 6. Type 2 diabetes mellitus put on sliding scale 7. History of DVT on Eliquis 8. Hyperlipidemia unspecified on atorvastatin 9. Cough.  Chest x-ray ordered  Code Status:     Code Status Orders  (From admission, onward)         Start     Ordered   07/05/19 1254  Full code  Continuous     07/05/19 1256        Code Status History    This patient has a current code status but no historical code status.   Advance Care Planning Activity      Disposition Plan: Can go home once potassium is better  Time spent: 28 minutes  [place]

## 2019-07-08 LAB — BASIC METABOLIC PANEL
Anion gap: 6 (ref 5–15)
BUN: 47 mg/dL — ABNORMAL HIGH (ref 8–23)
CO2: 21 mmol/L — ABNORMAL LOW (ref 22–32)
Calcium: 8.6 mg/dL — ABNORMAL LOW (ref 8.9–10.3)
Chloride: 112 mmol/L — ABNORMAL HIGH (ref 98–111)
Creatinine, Ser: 1.63 mg/dL — ABNORMAL HIGH (ref 0.61–1.24)
GFR calc Af Amer: 49 mL/min — ABNORMAL LOW (ref >60)
GFR calc non Af Amer: 42 mL/min — ABNORMAL LOW (ref >60)
Glucose, Bld: 176 mg/dL — ABNORMAL HIGH (ref 70–99)
Potassium: 5 mmol/L (ref 3.5–5.1)
Sodium: 139 mmol/L (ref 135–145)

## 2019-07-08 LAB — CBC
HCT: 31.3 % — ABNORMAL LOW (ref 39.0–52.0)
Hemoglobin: 9.6 g/dL — ABNORMAL LOW (ref 13.0–17.0)
MCH: 29.3 pg (ref 26.0–34.0)
MCHC: 30.7 g/dL (ref 30.0–36.0)
MCV: 95.4 fL (ref 80.0–100.0)
Platelets: 159 10*3/uL (ref 150–400)
RBC: 3.28 MIL/uL — ABNORMAL LOW (ref 4.22–5.81)
RDW: 15.6 % — ABNORMAL HIGH (ref 11.5–15.5)
WBC: 5.1 10*3/uL (ref 4.0–10.5)
nRBC: 0 % (ref 0.0–0.2)

## 2019-07-08 LAB — GLUCOSE, CAPILLARY: Glucose-Capillary: 160 mg/dL — ABNORMAL HIGH (ref 70–99)

## 2019-07-08 MED ORDER — GLIPIZIDE ER 2.5 MG PO TB24: 2.5000 mg | ORAL_TABLET | Freq: Every day | ORAL | 0 refills | Status: DC

## 2019-07-08 MED ORDER — FUROSEMIDE 40 MG PO TABS: 40.0000 mg | ORAL_TABLET | Freq: Every day | ORAL | 0 refills | Status: DC

## 2019-07-08 MED ORDER — SODIUM ZIRCONIUM CYCLOSILICATE 10 G PO PACK: 10.0000 g | PACK | Freq: Two times a day (BID) | ORAL | 0 refills | Status: DC

## 2019-07-08 NOTE — Discharge Instructions (Signed)
Food Basics for Chronic Kidney Disease °When your kidneys are not working well, they cannot remove waste and excess substances from your blood as effectively as they did before. This can lead to a buildup and imbalance of these substances, which can worsen kidney damage and affect how your body functions. Certain foods lead to a buildup of these substances in the body. By changing your diet as recommended by your diet and nutrition specialist (dietitian) or health care provider, you could help prevent further kidney damage and delay or prevent the need for dialysis. °What are tips for following this plan? °General instructions ° °· Work with your health care provider and dietitian to develop a meal plan that is right for you. Foods you can eat, limit, or avoid will be different for each person depending on the stage of kidney disease and any other existing health conditions. °· Talk with your health care provider about whether you should take a vitamin and mineral supplement. °· Use standard measuring cups and spoons to measure servings of foods. Use a kitchen scale to measure portions of protein foods. °· If directed by your health care provider, avoid drinking too much fluid. Measure and count all liquids, including water, ice, soups, flavored gelatin, and frozen desserts such as popsicles or ice cream. °Reading food labels °· Check the amount of sodium in foods. Choose foods that have less than 300 milligrams (mg) per serving. °· Check the ingredient list for phosphorus or potassium-based additives or preservatives. °· Check the amount of saturated and trans fat. Limit or avoid these fats as told by your dietitian. °Shopping °· Avoid buying foods that are: °? Processed, frozen, or prepackaged. °? Calcium-enriched or fortified. °· Do not buy foods that have salt or sodium listed among the first five ingredients. °· Do not buy canned vegetables. °Cooking °· Replace animal proteins, such as meat, fish, eggs, or  dairy, with plant proteins from beans, nuts, and soy. °? Use soy milk instead of cow's milk. °? Add beans or tofu to soups, casseroles, or pasta dishes instead of meat. °· Soak vegetables, such as potatoes, before cooking to reduce potassium. To do this: °? Peel and cut into small pieces. °? Soak in warm water for at least 2 hours. For every 1 cup of vegetables, use 10 cups of water. °? Drain and rinse with warm water. °? Boil for at least 5 minutes. °Meal planning °· Limit the amount of protein from plant and animal sources you eat each day. °· Do not add salt to food when cooking or before eating. °· Eat meals and snacks at around the same time each day. °If you have diabetes: °· If you have diabetes (diabetes mellitus) and chronic kidney disease, it is important to keep your blood glucose in the target range recommended by your health care provider. Follow your diabetes management plan. This may include: °? Checking your blood glucose regularly. °? Taking oral medicines, insulin, or both. °? Exercising for at least 30 minutes on 5 or more days each week, or as told by your health care provider. °? Tracking how many servings of carbohydrates you eat at each meal. °· You may be given specific guidelines on how much of certain foods and nutrients you may eat, depending on your stage of kidney disease and whether you have high blood pressure (hypertension). Follow your meal plan as told by your dietitian. °What nutrients should be limited? °The items listed are not a complete list. Talk with your dietitian   about what dietary choices are best for you. °Potassium °Potassium affects how steadily your heart beats. If too much potassium builds up in your blood, it can cause an irregular heartbeat or even a heart attack. °You may need to eat less potassium, depending on your blood potassium levels and the stage of kidney disease. Talk to your dietitian about how much potassium you may have each day. °You may need to limit  or avoid foods that are high in potassium, such as: °· Milk and soy milk. °· Fruits, such as bananas, papaya, apricots, nectarines, melon, prunes, raisins, kiwi, and oranges. °· Vegetables, such as potatoes, sweet potatoes, yams, tomatoes, leafy greens, beets, okra, avocado, pumpkin, and winter squash. °· White and lima beans. °Phosphorus °Phosphorus is a mineral found in your bones. A balance between calcium and phosphorous is needed to build and maintain healthy bones. Too much phosphorus pulls calcium from your bones. This can make your bones weak and more likely to break. Too much phosphorus can also make your skin itch. °You may need to eat less phosphorus depending on your blood phosphorus levels and the stage of kidney disease. Talk to your dietitian about how much potassium you may have each day. You may need to take medicine to lower your blood phosphorus levels if diet changes do not help. °You may need to limit or avoid foods that are high in phosphorus, such as: °· Milk and dairy products. °· Dried beans and peas. °· Tofu, soy milk, and other soy-based meat replacements. °· Colas. °· Nuts and peanut butter. °· Meat, poultry, and fish. °· Bran cereals and oatmeals. °Protein °Protein helps you to make and keep muscle. It also helps in the repair of your body’s cells and tissues. One of the natural breakdown products of protein is a waste product called urea. When your kidneys are not working properly, they cannot remove wastes, such as urea, like they did before you developed chronic kidney disease. Reducing how much protein you eat can help prevent a buildup of urea in your blood. °Depending on your stage of kidney disease, you may need to limit foods that are high in protein. Sources of animal protein include: °· Meat (all types). °· Fish and seafood. °· Poultry. °· Eggs. °· Dairy. °Other protein foods include: °· Beans and legumes. °· Nuts and nut butter. °· Soy and tofu. °Sodium °Sodium, which is found  in salt, helps maintain a healthy balance of fluids in your body. Too much sodium can increase your blood pressure and have a negative effect on the function of your heart and lungs. Too much sodium can also cause your body to retain too much fluid, making your kidneys work harder. °Most people should have less than 2,300 milligrams (mg) of sodium each day. If you have hypertension, you may need to limit your sodium to 1,500 mg each day. Talk to your dietitian about how much sodium you may have each day. °You may need to limit or avoid foods that are high in sodium, such as: °· Salt seasonings. °· Soy sauce. °· Cured and processed meats. °· Salted crackers and snack foods. °· Fast food. °· Canned soups and most canned foods. °· Pickled foods. °· Vegetable juice. °· Boxed mixes or ready-to-eat boxed meals and side dishes. °· Bottled dressings, sauces, and marinades. °Summary °· Chronic kidney disease can lead to a buildup and imbalance of waste and excess substances in the body. Certain foods lead to a buildup of these substances. By adjusting   your intake of these foods, you could help prevent more kidney damage and delay or prevent the need for dialysis. °· Food adjustments are different for each person with chronic kidney disease. Work with a dietitian to set up nutrient goals and a meal plan that is right for you. °· If you have diabetes and chronic kidney disease, it is important to keep your blood glucose in the target range recommended by your health care provider. °This information is not intended to replace advice given to you by your health care provider. Make sure you discuss any questions you have with your health care provider. °Document Released: 02/21/2003 Document Revised: 03/24/2019 Document Reviewed: 11/26/2016 °Elsevier Patient Education © 2020 [employer] ° °

## 2019-07-08 NOTE — Progress Notes (Signed)
Patient discharged to home.  Tele and IV d/c'd prior to discharge. Patient verbalizes understanding of discharge instructions. 

## 2019-07-08 NOTE — Progress Notes (Signed)
[facility]  ROUNDING NOTE   Subjective:  Patient significantly improved with treatment of hyperkalemia. Patient states weakness has resolved. Potassium currently 5.0.  Objective:  Vital signs in last 24 hours:  Temp:  [97.6 F (36.4 C)-98 F (36.7 C)] 97.6 F (36.4 C) (07/24 0753) Pulse Rate:  [55-64] 61 (07/24 0753) Resp:  [16] 16 (07/24 0320) BP: (104-121)/(58-70) 111/59 (07/24 0753) SpO2:  [96 %-100 %] 98 % (07/24 0753) Weight:  [126.6 kg] 126.6 kg (07/24 0320)  Weight change:  Filed Weights   07/05/19 1032 07/05/19 1519 07/08/19 0320  Weight: 127 kg 127.6 kg 126.6 kg    Intake/Output: I/O last 3 completed shifts: In: 2007.7 [P.O.:960; I.V.:1047.7] Out: 4125 [Urine:4125]   Intake/Output this shift:  Total I/O In: 240 [P.O.:240] Out: -   Physical Exam: General: No acute distress  Head: Normocephalic, atraumatic. Moist oral mucosal membranes  Eyes: Anicteric  Neck: Supple, trachea midline  Lungs:  Clear to auscultation, normal effort  Heart: S1S2 no rubs  Abdomen:  Soft, nontender, bowel sounds present  Extremities: Trace peripheral edema.  Neurologic: Awake, alert, following commands  Skin: No lesions       Basic Metabolic Panel: Recent Labs  Lab 07/05/19 1530 07/06/19 0230 07/06/19 1557 07/07/19 0448 07/08/19 0627  NA 141 143 141 145 139  K >7.5* 7.0* 6.4* 5.8* 5.0  CL 124* 125* 121* 123* 112*  CO2 13* 15* 15* 17* 21*  GLUCOSE 121* 112* 167* 130* 176*  BUN 80* 71* 62* 59* 47*  CREATININE 2.54* 2.08* 2.18* 1.86* 1.63*  CALCIUM 8.4* 8.5* 8.4* 8.3* 8.6*  MG 2.2  --   --   --   --     Liver Function Tests: Recent Labs  Lab 07/05/19 1058  AST 27  ALT 35  ALKPHOS 120  BILITOT 0.4  PROT 7.1  ALBUMIN 3.8   No results for input(s): LIPASE, AMYLASE in the last 168 hours. No results for input(s): AMMONIA in the last 168 hours.  CBC: Recent Labs  Lab 07/05/19 1058 07/08/19 0627  WBC 5.5 5.1  HGB 9.6* 9.6*  HCT 32.9* 31.3*   MCV 99.7 95.4  PLT 148* 159    Cardiac Enzymes: No results for input(s): CKTOTAL, CKMB, CKMBINDEX, TROPONINI in the last 168 hours.  BNP: Invalid input(s): POCBNP  CBG: Recent Labs  Lab 07/07/19 0754 07/07/19 1145 07/07/19 1632 07/07/19 2115 07/08/19 0754  GLUCAP 131* 150* 119* 140* 160*    Microbiology: Results for orders placed or performed during the hospital encounter of 07/05/19  SARS Coronavirus 2 (CEPHEID - Performed in [place] hospital lab), Hosp Order     Status: None   Collection Time: 07/05/19  1:37 PM   Specimen: Nasopharyngeal Swab  Result Value Ref Range Status   SARS Coronavirus 2 NEGATIVE NEGATIVE Final    Comment: (NOTE) If result is NEGATIVE SARS-CoV-2 target nucleic acids are NOT DETECTED. The SARS-CoV-2 RNA is generally detectable in upper and lower  respiratory specimens during the acute phase of infection. The lowest  concentration of SARS-CoV-2 viral copies this assay can detect is 250  copies / mL. A negative result does not preclude SARS-CoV-2 infection  and should not be used as the sole basis for treatment or other  patient management decisions.  A negative result may occur with  improper specimen collection / handling, submission of specimen other  than nasopharyngeal swab, presence of viral mutation(s) within the  areas targeted by this assay, and inadequate number of viral  copies  (<250 copies / mL). A negative result must be combined with clinical  observations, patient history, and epidemiological information. If result is POSITIVE SARS-CoV-2 target nucleic acids are DETECTED. The SARS-CoV-2 RNA is generally detectable in upper and lower  respiratory specimens dur ing the acute phase of infection.  Positive  results are indicative of active infection with SARS-CoV-2.  Clinical  correlation with patient history and other diagnostic information is  necessary to determine patient infection status.  Positive results do  not rule out  bacterial infection or co-infection with other viruses. If result is PRESUMPTIVE POSTIVE SARS-CoV-2 nucleic acids MAY BE PRESENT.   A presumptive positive result was obtained on the submitted specimen  and confirmed on repeat testing.  While 2019 novel coronavirus  (SARS-CoV-2) nucleic acids may be present in the submitted sample  additional confirmatory testing may be necessary for epidemiological  and / or clinical management purposes  to differentiate between  SARS-CoV-2 and other Sarbecovirus currently known to infect humans.  If clinically indicated additional testing with an alternate test  methodology [phone]) is advised. The SARS-CoV-2 RNA is generally  detectable in upper and lower respiratory sp ecimens during the acute  phase of infection. The expected result is Negative. Fact Sheet for Patients:  StrictlyIdeas.no Fact Sheet for Healthcare Providers: BankingDealers.co.za This test is not yet approved or cleared by the [place] FDA and has been authorized for detection and/or diagnosis of SARS-CoV-2 by FDA under an Emergency Use Authorization (EUA).  This EUA will remain in effect (meaning this test can be used) for the duration of the COVID-19 declaration under Section 564(b)(1) of the Act, 21 U.S.C. section 360bbb-3(b)(1), unless the authorization is terminated or revoked sooner. Performed at [facility], [address] [place], [place] [address]     Coagulation Studies: No results for input(s): LABPROT, INR in the last 72 hours.  Urinalysis: No results for input(s): COLORURINE, LABSPEC, PHURINE, GLUCOSEU, HGBUR, BILIRUBINUR, KETONESUR, PROTEINUR, UROBILINOGEN, NITRITE, LEUKOCYTESUR in the last 72 hours.  Invalid input(s): APPERANCEUR    Imaging: Dg Chest 2 View  Result Date: 07/07/2019 CLINICAL DATA:  Cough.  History of renal cell carcinoma. EXAM: CHEST - 2 VIEW COMPARISON:  January 24, 2019  FINDINGS: Stable mild cardiomegaly. The hila and mediastinum are unremarkable. No pneumothorax. No pulmonary nodules or masses. Minimal atelectasis in the left base. No focal infiltrates. IMPRESSION: No active cardiopulmonary disease. Electronically Signed   By: Dorise Bullion III M.D   On: 07/07/2019 15:18   US Renal  Result Date: 07/06/2019 CLINICAL DATA:  Acute renal failure. EXAM: RENAL / URINARY TRACT ULTRASOUND COMPLETE COMPARISON:  CT 01/27/2014 FINDINGS: Right Kidney: Renal measurements: 13.1 x 6.6 x 7.4 cm = volume: 334 mL . Echogenicity within normal limits. No mass or hydronephrosis visualized. 2.5 cm cyst over the upper pole. Left Kidney: Previous left nephrectomy. Bladder: Appears normal for degree of bladder distention. Right-sided ureteral jet visualized. IMPRESSION: Normal size right kidney without hydronephrosis. 2.5 cm cyst over the upper pole. Previous left nephrectomy. Electronically Signed   By: Marin Olp M.D.   On: 07/06/2019 15:44     Medications:   .  sodium bicarbonate (isotonic) infusion in sterile water 50 mL/hr at 07/08/19 0206   . apixaban  5 mg Oral Q12H  . atorvastatin  10 mg Oral Daily  . carvedilol  6.25 mg Oral BID WC  . DULoxetine  30 mg Oral Daily  . fluticasone  1 spray Each Nare BID  . gabapentin  300 mg Oral QHS  . insulin aspart  0-5 Units Subcutaneous QHS  . insulin aspart  0-9 Units Subcutaneous TID WC  . pantoprazole  40 mg Oral BID  . PARoxetine  40 mg Oral [id number]  . pramipexole  1 mg Oral BID  . sodium zirconium cyclosilicate  10 g Oral TID  . sucralfate  1 g Oral TID  . umeclidinium-vilanterol  1 puff Inhalation Daily   albuterol, tiZANidine, traMADol  Assessment/ Plan:  73 y.o. male 73 year old Caucasian male with past medical history of diabetes mellitus, hyperlipidemia, hypertension, renal cell carcinoma status post left nephrectomy in March of 2004, gastric ulcers, COPD, chronic lower back pain, carpal tunnel syndrome, status post  gastric bypass in 2010, LLE DVT, right sided PE, hx of right chest tube placement.  1. Acute renal failure/Chronic kidney disease stage III/proteinuria/hyperkalemia.  Patient has now developed acute renal failure.  May be related to diuretics.  Suspect he developed volume depletion which led to acute renal failure, which then led to hyperkalemia.  -Okay to discontinue IV fluids at this time.  We will maintain the patient on Lokelma but reduce this to 10 g p.o. twice daily when he goes home.  We will need to monitor his renal parameters closely as an outpatient next week.  Recommend keeping patient off ACE inhibitor as well as spironolactone.  2. Hypertension.  Maintain the patient on carvedilol at this time.  Hold ACE inhibitor and spironolactone.  3. Lower extremity edema.  Resume Lasix at 40 mg daily when he goes home.  Do not restart potassium chloride.  4. Chronic systolic heart failure.  No sign of volume overload at this time, pt off diuretics.    LOS: 3 Iysha Mishkin 7/24/202011:46 AM

## 2019-07-08 NOTE — Plan of Care (Signed)
Nutrition Education Note  73 y.o. male 73 year old Caucasian male with past medical history of diabetes mellitus, hyperlipidemia, hypertension, renal cell carcinoma status post left nephrectomy in March of 2004, gastric ulcers, COPD, chronic lower back pain, carpal tunnel syndrome, status post gastric bypass in 2010, LLE DVT, right sided PE, hx of right chest tube placement.   RD consulted for renal diet education. Provided "Low sodium Nutrition Therapy" handout from the Academy of Nutrition and Dietetics. Reviewed food groups and provided written recommended serving sizes specifically determined for patient's current nutritional status.   Explained why diet restrictions are needed and provided lists of foods to limit/avoid that are high potassium, sodium, and phosphorus. Provided specific recommendations on safer alternatives of these foods. Strongly encouraged compliance of this diet.   Teach back method used.  Expect good compliance.  Body mass index is 37.86 kg/m. Pt meets criteria for obesity based on current BMI.  Current diet order is renal/CHO modified, patient is consuming approximately 100% of meals at this time. Labs and medications reviewed. No further nutrition interventions warranted at this time. RD contact information provided. If additional nutrition issues arise, please re-consult RD.  Koleen Distance MS, RD, LDN Pager #- [phone] Office#- [phone] After Hours Pager: [phone]

## 2019-07-08 NOTE — Care Management Important Message (Signed)
Important Message  Patient Details  Name: [name] MRN: [id number] Date of Birth: 31-Aug-1946   Medicare Important Message Given:  Yes     Dannette Barbara 07/08/2019, 12:39 PM

## 2019-07-08 NOTE — Discharge Summary (Signed)
[facility] [place] NAME: [name]    MR#:  [id number]  DATE OF BIRTH:  1946/06/16  DATE OF ADMISSION:  07/05/2019 ADMITTING PHYSICIAN: Lang Snow, NP  DATE OF DISCHARGE: 07/08/2019 12:50 PM  PRIMARY CARE PHYSICIAN: Derinda Late, MD    ADMISSION DIAGNOSIS:  Hyperkalemia [E87.5] Acute renal failure, unspecified acute renal failure type ([place]) [N17.9]  DISCHARGE DIAGNOSIS:  Active Problems:   Hyperkalemia   SECONDARY DIAGNOSIS:   Past Medical History:  Diagnosis Date  . Chronic back pain   . COPD (chronic obstructive pulmonary disease) ([place])   . Depression   . Diabetes mellitus without complication ([place])   . Hyperlipidemia   . Hypertension   . Renal cell carcinoma ([place])   . Sleep apnea     HOSPITAL COURSE:   1.  Severe hyperkalemia on presentation with potassium greater than 7.5.  Patient was given stat medications including IV calcium, sodium bicarb and insulin and dextrose.  Patient was started on Lokelma and that dose was increased to 3 times a day.  Potassium was slow to come down.  There was greater than 7.5 then 7 then 6.4 then 5.8 then 5.0.  Nephrology was okay with discharging the patient on Lokelma twice a day.  We stopped the patient's spironolactone, enalapril and potassium supplementation.  Follow-up with Dr. Holley Raring nephrology next week for BMP.  Hopefully can get rid of Lokelma at that time or at least decrease the dose.  Low potassium diet discussed.  Advised not to drink Gatorade. 2.  Acute kidney injury on chronic kidney disease stage III. Patient's creatinine was elevated 2.65 and came down to 1.63 upon discharge home. (I have seen a creatinine of 71 in the computer in February which could be the patient's baseline) 3.  Hypertension on Coreg.  Spironolactone enalapril were held secondary to hyperkalemia and acute kidney injury 4.  Chronic pain continue usual medications 5.  history of renal cell  carcinoma 6.  Type 2 diabetes mellitus.  I am hesitant on the large dose of glipizide that he takes at home.  I will decrease this dose down to 2.5 mg.  Can consider 1 of the newer agents as outpatient. 7.  History of DVT on Eliquis 8.  Hyperlipidemia unspecified on atorvastatin 9.  Cough.  Chest x-ray negative  DISCHARGE CONDITIONS:   Satisfactory  CONSULTS OBTAINED:  None  DRUG ALLERGIES:  No Known Allergies  DISCHARGE MEDICATIONS:   Allergies as of 07/08/2019   No Known Allergies     Medication List    STOP taking these medications   enalapril 20 MG tablet Commonly known as: VASOTEC   potassium chloride SA 20 MEQ tablet Commonly known as: K-DUR   spironolactone 25 MG tablet Commonly known as: ALDACTONE     TAKE these medications   albuterol 108 (90 Base) MCG/ACT inhaler Commonly known as: VENTOLIN HFA Inhale 2 puffs into the lungs every 4 (four) hours as needed for wheezing or shortness of breath.   Anoro Ellipta 62.5-25 MCG/INH Aepb Generic drug: umeclidinium-vilanterol Inhale 1 puff into the lungs daily.   atorvastatin 10 MG tablet Commonly known as: LIPITOR Take 10 mg by mouth daily.   carvedilol 6.25 MG tablet Commonly known as: COREG Take 6.25 mg by mouth 2 (two) times daily with a meal.   DULoxetine 30 MG capsule Commonly known as: CYMBALTA Take 30 mg by mouth daily.   Eliquis 5 MG Tabs tablet Generic drug: apixaban Take  5 mg by mouth every 12 (twelve) hours.   fluticasone 50 MCG/ACT nasal spray Commonly known as: FLONASE Place 1 spray into both nostrils 2 (two) times a day.   furosemide 40 MG tablet Commonly known as: LASIX Take 1 tablet (40 mg total) by mouth daily. What changed: when to take this   gabapentin 300 MG capsule Commonly known as: NEURONTIN Take 300 mg by mouth at bedtime.   glipiZIDE 2.5 MG 24 hr tablet Commonly known as: GLUCOTROL XL Take 1 tablet (2.5 mg total) by mouth daily with breakfast. What changed:    medication strength  how much to take   multivitamin tablet Take 1 tablet by mouth daily.   pantoprazole 40 MG tablet Commonly known as: PROTONIX Take 40 mg by mouth 2 (two) times daily.   PARoxetine 40 MG tablet Commonly known as: PAXIL Take 40 mg by mouth every morning.   pramipexole 1 MG tablet Commonly known as: MIRAPEX Take 1 mg by mouth 2 (two) times a day.   sodium zirconium cyclosilicate 10 g Pack packet Commonly known as: LOKELMA Take 10 g by mouth 2 (two) times daily.   sucralfate 1 g tablet Commonly known as: CARAFATE Take 1 g by mouth 3 (three) times daily.   tiZANidine 2 MG tablet Commonly known as: ZANAFLEX Take 1 tablet (2 mg total) by mouth every 8 (eight) hours as needed for muscle spasms.   traMADol 50 MG tablet Commonly known as: ULTRAM Take 50 mg by mouth every 6 (six) hours as needed.        DISCHARGE INSTRUCTIONS:   Follow-up with PMD  If you experience worsening of your admission symptoms, develop shortness of breath, life threatening emergency, suicidal or homicidal thoughts you must seek medical attention immediately by calling 911 or calling your MD immediately  if symptoms less severe.  You Must read complete instructions/literature along with all the possible adverse reactions/side effects for all the Medicines you take and that have been prescribed to you. Take any new Medicines after you have completely understood and accept all the possible adverse reactions/side effects.   Please note  You were cared for by a hospitalist during your hospital stay. If you have any questions about your discharge medications or the care you received while you were in the hospital after you are discharged, you can call the unit and asked to speak with the hospitalist on call if the hospitalist that took care of you is not available. Once you are discharged, your primary care physician will handle any further medical issues. Please note that NO REFILLS  for any discharge medications will be authorized once you are discharged, as it is imperative that you return to your primary care physician (or establish a relationship with a primary care physician if you do not have one) for your aftercare needs so that they can reassess your need for medications and monitor your lab values.    Today   CHIEF COMPLAINT:   Chief Complaint  Patient presents with  . Abnormal Lab    HISTORY OF PRESENT ILLNESS:  Bentley Fissel  is a 73 y.o. male sent in for high potassium   VITAL SIGNS:  Blood pressure (!) 111/59, pulse 61, temperature 97.6 F (36.4 C), temperature source Oral, resp. rate 16, height 6' (1.829 m), weight 126.6 kg, SpO2 98 %.   PHYSICAL EXAMINATION:  GENERAL:  73 y.o.-year-old patient lying in the bed with no acute distress.  EYES: Pupils equal, round, reactive to light  and accommodation. No scleral icterus. Extraocular muscles intact.  HEENT: Head atraumatic, normocephalic. Oropharynx and nasopharynx clear.  NECK:  Supple, no jugular venous distention. No thyroid enlargement, no tenderness.  LUNGS: Normal breath sounds bilaterally, no wheezing, rales,rhonchi or crepitation. No use of accessory muscles of respiration.  CARDIOVASCULAR: S1, S2 normal. No murmurs, rubs, or gallops.  ABDOMEN: Soft, non-tender, non-distended. Bowel sounds present. No organomegaly or mass.  EXTREMITIES: 2+ pedal edema.no cyanosis, or clubbing.  NEUROLOGIC: Cranial nerves II through XII are intact. Muscle strength 5/5 in all extremities. Sensation intact. Gait not checked.  PSYCHIATRIC: The patient is alert and oriented x 3.  SKIN: No obvious rash, lesion, or ulcer.   DATA REVIEW:   CBC Recent Labs  Lab 07/08/19 0627  WBC 5.1  HGB 9.6*  HCT 31.3*  PLT 159    Chemistries  Recent Labs  Lab 07/05/19 1058 07/05/19 1530  07/08/19 0627  NA 140 141   < > 139  K >7.5* >7.5*   < > 5.0  CL 123* 124*   < > 112*  CO2 13* 13*   < > 21*  GLUCOSE 132*  121*   < > 176*  BUN 83* 80*   < > 47*  CREATININE 2.65* 2.54*   < > 1.63*  CALCIUM 8.5* 8.4*   < > 8.6*  MG  --  2.2  --   --   AST 27  --   --   --   ALT 35  --   --   --   ALKPHOS 120  --   --   --   BILITOT 0.4  --   --   --    < > = values in this interval not displayed.    Microbiology Results  Results for orders placed or performed during the hospital encounter of 07/05/19  SARS Coronavirus 2 (CEPHEID - Performed in [place] hospital lab), Hosp Order     Status: None   Collection Time: 07/05/19  1:37 PM   Specimen: Nasopharyngeal Swab  Result Value Ref Range Status   SARS Coronavirus 2 NEGATIVE NEGATIVE Final    Comment: (NOTE) If result is NEGATIVE SARS-CoV-2 target nucleic acids are NOT DETECTED. The SARS-CoV-2 RNA is generally detectable in upper and lower  respiratory specimens during the acute phase of infection. The lowest  concentration of SARS-CoV-2 viral copies this assay can detect is 250  copies / mL. A negative result does not preclude SARS-CoV-2 infection  and should not be used as the sole basis for treatment or other  patient management decisions.  A negative result may occur with  improper specimen collection / handling, submission of specimen other  than nasopharyngeal swab, presence of viral mutation(s) within the  areas targeted by this assay, and inadequate number of viral copies  (<250 copies / mL). A negative result must be combined with clinical  observations, patient history, and epidemiological information. If result is POSITIVE SARS-CoV-2 target nucleic acids are DETECTED. The SARS-CoV-2 RNA is generally detectable in upper and lower  respiratory specimens dur ing the acute phase of infection.  Positive  results are indicative of active infection with SARS-CoV-2.  Clinical  correlation with patient history and other diagnostic information is  necessary to determine patient infection status.  Positive results do  not rule out bacterial  infection or co-infection with other viruses. If result is PRESUMPTIVE POSTIVE SARS-CoV-2 nucleic acids MAY BE PRESENT.   A presumptive positive result was obtained on  the submitted specimen  and confirmed on repeat testing.  While 2019 novel coronavirus  (SARS-CoV-2) nucleic acids may be present in the submitted sample  additional confirmatory testing may be necessary for epidemiological  and / or clinical management purposes  to differentiate between  SARS-CoV-2 and other Sarbecovirus currently known to infect humans.  If clinically indicated additional testing with an alternate test  methodology [phone]) is advised. The SARS-CoV-2 RNA is generally  detectable in upper and lower respiratory sp ecimens during the acute  phase of infection. The expected result is Negative. Fact Sheet for Patients:  StrictlyIdeas.no Fact Sheet for Healthcare Providers: BankingDealers.co.za This test is not yet approved or cleared by the [place] FDA and has been authorized for detection and/or diagnosis of SARS-CoV-2 by FDA under an Emergency Use Authorization (EUA).  This EUA will remain in effect (meaning this test can be used) for the duration of the COVID-19 declaration under Section 564(b)(1) of the Act, 21 U.S.C. section 360bbb-3(b)(1), unless the authorization is terminated or revoked sooner. Performed at [facility], [address], [place], [place] [address]     RADIOLOGY:  Dg Chest 2 View  Result Date: 07/07/2019 CLINICAL DATA:  Cough.  History of renal cell carcinoma. EXAM: CHEST - 2 VIEW COMPARISON:  January 24, 2019 FINDINGS: Stable mild cardiomegaly. The hila and mediastinum are unremarkable. No pneumothorax. No pulmonary nodules or masses. Minimal atelectasis in the left base. No focal infiltrates. IMPRESSION: No active cardiopulmonary disease. Electronically Signed   By: Dorise Bullion III M.D   On: 07/07/2019 15:18       Management plans discussed with the patient, and he is in agreement.  CODE STATUS:     Code Status Orders  (From admission, onward)         Start     Ordered   07/05/19 1254  Full code  Continuous     07/05/19 1256        Code Status History    This patient has a current code status but no historical code status.   Advance Care Planning Activity      TOTAL TIME TAKING CARE OF THIS PATIENT: 35 minutes.    Loletha Grayer M.D on 07/08/2019 at 4:43 PM  Between 7am to 6pm - Pager - [phone]  After 6pm go to [email or web address] - password [email or web address] [facility] Office  [phone]  CC: Primary care physician; Derinda Late, MD

## 2019-07-08 NOTE — Progress Notes (Signed)
Patient care taken over by this RN.  Report given. Patient without complaints.  Call bell in reach.  Patient to be discharged.

## 2019-07-08 NOTE — Care Management Important Message (Signed)
Important Message  Patient Details  Name: [name] MRN: [id number] Date of Birth: 11-18-1946   Medicare Important Message Given:  Yes     Dannette Barbara 07/08/2019, 12:39 PM

## 2019-07-08 NOTE — TOC Transition Note (Signed)
Transition of Care [facility]) - CM/SW Discharge Note   Patient Details  Name: [name] MRN: [id number] Date of Birth: Oct 30, 1946  Transition of Care [facility]) CM/SW Contact:  Su Hilt, RN Phone Number: 07/08/2019, 8:30 AM   Clinical Narrative:    Talked with the patient on the phone and completed the high risk readmisison assessment, he lives at home with his wife they both drive and have no problem with transportation, He has an appointment scheduled next week with his doctor. [facility], I encouraged him to have his Pharmacy to review all meds any time her gets a new RX. He has a walker at home and does not need any additional DME No Home health needs.  Patient stated he is doing much better and is ready to go home    Final next level of care: Home/Self Care Barriers to Discharge: Barriers Resolved   Patient Goals and CMS Choice Patient states their goals for this hospitalization and ongoing recovery are:: go home      Discharge Placement                       Discharge Plan and Services   Discharge Planning Services: CM Consult            DME Arranged: N/A         HH Arranged: NA          Social Determinants of Health (SDOH) Interventions     Readmission Risk Interventions Readmission Risk Prevention Plan 07/08/2019  Transportation Screening Complete  PCP or Specialist Appt within 3-5 Days Complete  HRI or [place] Complete  Social Work Consult for [place] Planning/Counseling Complete  Palliative Care Screening Not Applicable  Medication Review Press photographer) Complete  Some recent data might be hidden

## 2019-07-15 ENCOUNTER — Emergency Department
Admission: EM | Admit: 2019-07-15 | Discharge: 2019-07-15 | Disposition: A | Discharge status: Home / Self Care | Payer: Medicare Other | Attending: Emergency Medicine | Admitting: Emergency Medicine

## 2019-07-15 ENCOUNTER — Other Ambulatory Visit: Payer: Self-pay

## 2019-07-15 DIAGNOSIS — R42 Dizziness and giddiness: Secondary | ICD-10-CM | POA: Insufficient documentation

## 2019-07-15 DIAGNOSIS — Z5321 Procedure and treatment not carried out due to patient leaving prior to being seen by health care provider: Secondary | ICD-10-CM | POA: Diagnosis not present

## 2019-07-15 LAB — BASIC METABOLIC PANEL
Anion gap: 10 (ref 5–15)
BUN: 42 mg/dL — ABNORMAL HIGH (ref 8–23)
CO2: 20 mmol/L — ABNORMAL LOW (ref 22–32)
Calcium: 8.3 mg/dL — ABNORMAL LOW (ref 8.9–10.3)
Chloride: 111 mmol/L (ref 98–111)
Creatinine, Ser: 1.77 mg/dL — ABNORMAL HIGH (ref 0.61–1.24)
GFR calc Af Amer: 44 mL/min — ABNORMAL LOW (ref >60)
GFR calc non Af Amer: 38 mL/min — ABNORMAL LOW (ref >60)
Glucose, Bld: 209 mg/dL — ABNORMAL HIGH (ref 70–99)
Potassium: 3.5 mmol/L (ref 3.5–5.1)
Sodium: 141 mmol/L (ref 135–145)

## 2019-07-15 LAB — CBC
HCT: 31.1 % — ABNORMAL LOW (ref 39.0–52.0)
Hemoglobin: 9.5 g/dL — ABNORMAL LOW (ref 13.0–17.0)
MCH: 28.9 pg (ref 26.0–34.0)
MCHC: 30.5 g/dL (ref 30.0–36.0)
MCV: 94.5 fL (ref 80.0–100.0)
Platelets: 183 10*3/uL (ref 150–400)
RBC: 3.29 MIL/uL — ABNORMAL LOW (ref 4.22–5.81)
RDW: 15.5 % (ref 11.5–15.5)
WBC: 10.4 10*3/uL (ref 4.0–10.5)
nRBC: 0 % (ref 0.0–0.2)

## 2019-07-15 NOTE — ED Triage Notes (Signed)
C/o cough, fever, dizziness and sob with exertion. All sx began today. Pt alert and oriented X4, active, cooperative, pt in NAD. RR even and unlabored, color WNL.  Pt walking around lobby pushing his wheelchair when this RN found patient, states that he is feeling better.

## 2019-07-17 NOTE — Progress Notes (Signed)
[place]  [phone] [phone]  ID: [name] OB: 1946-08-24  MR#: [id number]  YOV#:[id number]  Patient Care Team: Derinda Late, MD as PCP - General (Family Medicine)   CHIEF COMPLAINT: Anemia in chronic kidney disease, mild iron deficiency.  INTERVAL HISTORY: Patient returns to clinic today for further evaluation and continuation of Procrit.  He continues to have significant back pain and is yet to undergo surgery.  He has chronic weakness and fatigue. He has no neurologic complaints.  He denies any chest pain, shortness of breath, cough, or hemoptysis.  He denies any abdominal pain.  He has no nausea, vomiting, constipation, or diarrhea.  He denies any melena or hematochezia.  He has no urinary complaints.  Patient offers no further specific complaints today.  REVIEW OF SYSTEMS:   Review of Systems  Constitutional: Positive for malaise/fatigue. Negative for fever and weight loss.  Respiratory: Negative.  Negative for cough, hemoptysis and shortness of breath.   Cardiovascular: Negative.  Negative for chest pain and leg swelling.  Gastrointestinal: Negative.  Negative for abdominal pain and constipation.  Genitourinary: Negative.  Negative for hematuria.  Musculoskeletal: Positive for back pain.  Skin: Negative.  Negative for rash.  Neurological: Positive for weakness. Negative for dizziness, sensory change, focal weakness and headaches.  Psychiatric/Behavioral: Negative.  The patient is not nervous/anxious.     As per HPI. Otherwise, a complete review of systems is negative.  PAST MEDICAL HISTORY: Past Medical History:  Diagnosis Date  . Chronic back pain   . COPD (chronic obstructive pulmonary disease) ([place])   . Depression   . Diabetes mellitus without complication ([place])   . Hyperlipidemia   . Hypertension   . Renal cell carcinoma ([place])   . Sleep apnea     PAST SURGICAL HISTORY: Past Surgical History:   Procedure Laterality Date  . CARPAL TUNNEL RELEASE    . CHOLECYSTECTOMY    . COLONOSCOPY WITH PROPOFOL N/A 07/29/2017   Procedure: COLONOSCOPY WITH PROPOFOL;  Surgeon: Manya Silvas, MD;  Location[facility] END[facility]COPY;  Service: Endoscopy;  Laterality: N/A;  . colonoscopys    . ESOPHAGOGASTRODUODENOSCOPY (EGD) WITH PROPOFOL N/A 07/29/2017   Procedure: ESOPHAGOGASTRODUODENOSCOPY (EGD) WITH PROPOFOL;  Surgeon: Manya Silvas, MD;  Location: [facility] ENDOSCOPY;  Service: Endoscopy;  Laterality: N/A;  . FLEXIBLE SIGMOIDOSCOPY    . GASTRIC BYPASS    . NEPHRECTOMY    . open reduction and internal fixation, right distal radius    . right knee arthroscopy    . UPPER GI ENDOSCOPY      FAMILY HISTORY: Family History  Problem Relation Age of Onset  . Diabetes Mother   . Cancer Mother   . Heart disease Father     ADVANCED DIRECTIVES (Y/N):  N  HEALTH MAINTENANCE: Social History   Tobacco Use  . Smoking status: Former Smoker    Packs/day: 1.00    Years: 30.00    Pack years: 30.00  . Smokeless tobacco: Current User    Types: Chew  Substance Use Topics  . Alcohol use: No    Frequency: Never  . Drug use: No     Colonoscopy:  PAP:  Bone density:  Lipid panel:  No Known Allergies  Current Outpatient Medications  Medication Sig Dispense Refill  . apixaban (ELIQUIS) 5 MG TABS tablet Take 5 mg by mouth every 12 (twelve) hours.     Marland Kitchen atorvastatin (LIPITOR) 10 MG tablet Take 10 mg by mouth daily.    . carvedilol (  COREG) 6.25 MG tablet Take 6.25 mg by mouth 2 (two) times daily with a meal.    . DULoxetine (CYMBALTA) 30 MG capsule Take 30 mg by mouth daily.    . fluticasone (FLONASE) 50 MCG/ACT nasal spray Place 1 spray into both nostrils 2 (two) times a day.    . furosemide (LASIX) 40 MG tablet Take 1 tablet (40 mg total) by mouth daily. 30 tablet 0  . gabapentin (NEURONTIN) 300 MG capsule Take 300 mg by mouth at bedtime.     Marland Kitchen glipiZIDE (GLUCOTROL XL) 2.5 MG 24 hr tablet Take 1 tablet  (2.5 mg total) by mouth daily with breakfast. 30 tablet 0  . Multiple Vitamin (MULTIVITAMIN) tablet Take 1 tablet by mouth daily.    . pantoprazole (PROTONIX) 40 MG tablet Take 40 mg by mouth 2 (two) times daily.     Marland Kitchen PARoxetine (PAXIL) 40 MG tablet Take 40 mg by mouth every morning.     . pramipexole (MIRAPEX) 1 MG tablet Take 1 mg by mouth 2 (two) times a day.     . sodium zirconium cyclosilicate (LOKELMA) 10 g PACK packet Take 10 g by mouth 2 (two) times daily. 60 packet 0  . sucralfate (CARAFATE) 1 g tablet Take 1 g by mouth 3 (three) times daily.     . traMADol (ULTRAM) 50 MG tablet Take 50 mg by mouth every 6 (six) hours as needed.    Jearl Klinefelter ELLIPTA 62.5-25 MCG/INH AEPB Inhale 1 puff into the lungs daily.      No current facility-administered medications for this visit.     OBJECTIVE: Vitals:   07/22/19 0957  BP: 108/64  Pulse: 65  Temp: (!) 97.4 F (36.3 C)     Body mass index is 37.97 kg/m.    ECOG FS:0 - Asymptomatic  General: Well-developed, well-nourished, no acute distress. Eyes: Pink conjunctiva, anicteric sclera. HEENT: Normocephalic, moist mucous membranes. Lungs: Clear to auscultation bilaterally. Heart: Regular rate and rhythm. No rubs, murmurs, or gallops. Abdomen: Soft, nontender, nondistended. No organomegaly noted, normoactive bowel sounds. Musculoskeletal: No edema, cyanosis, or clubbing. Neuro: Alert, answering all questions appropriately. Cranial nerves grossly intact. Skin: No rashes or petechiae noted. Psych: Normal affect.   LAB RESULTS:  Lab Results  Component Value Date   NA 141 07/15/2019   K 3.5 07/15/2019   CL 111 07/15/2019   CO2 20 (L) 07/15/2019   GLUCOSE 209 (H) 07/15/2019   BUN 42 (H) 07/15/2019   CREATININE 1.77 (H) 07/15/2019   CALCIUM 8.3 (L) 07/15/2019   PROT 7.1 07/05/2019   ALBUMIN 3.8 07/05/2019   AST 27 07/05/2019   ALT 35 07/05/2019   ALKPHOS 120 07/05/2019   BILITOT 0.4 07/05/2019   GFRNONAA 38 (L) 07/15/2019    GFRAA 44 (L) 07/15/2019    Lab Results  Component Value Date   WBC 6.6 07/22/2019   NEUTROABS 5.2 07/22/2019   HGB 8.7 (L) 07/22/2019   HCT 28.4 (L) 07/22/2019   MCV 95.6 07/22/2019   PLT 214 07/22/2019   Lab Results  Component Value Date   IRON 41 (L) 07/22/2019   TIBC 314 07/22/2019   IRONPCTSAT 13 (L) 07/22/2019   Lab Results  Component Value Date   FERRITIN 60 07/22/2019     STUDIES: Dg Chest 2 View  Result Date: 07/07/2019 CLINICAL DATA:  Cough.  History of renal cell carcinoma. EXAM: CHEST - 2 VIEW COMPARISON:  January 24, 2019 FINDINGS: Stable mild cardiomegaly. The hila and mediastinum are unremarkable.  No pneumothorax. No pulmonary nodules or masses. Minimal atelectasis in the left base. No focal infiltrates. IMPRESSION: No active cardiopulmonary disease. Electronically Signed   By: Dorise Bullion III M.D   On: 07/07/2019 15:18   US Renal  Result Date: 07/06/2019 CLINICAL DATA:  Acute renal failure. EXAM: RENAL / URINARY TRACT ULTRASOUND COMPLETE COMPARISON:  CT 01/27/2014 FINDINGS: Right Kidney: Renal measurements: 13.1 x 6.6 x 7.4 cm = volume: 334 mL . Echogenicity within normal limits. No mass or hydronephrosis visualized. 2.5 cm cyst over the upper pole. Left Kidney: Previous left nephrectomy. Bladder: Appears normal for degree of bladder distention. Right-sided ureteral jet visualized. IMPRESSION: Normal size right kidney without hydronephrosis. 2.5 cm cyst over the upper pole. Previous left nephrectomy. Electronically Signed   By: Marin Olp M.D.   On: 07/06/2019 15:44    ASSESSMENT: Anemia in chronic kidney disease, mild iron deficiency.  PLAN:    1. Anemia in chronic kidney disease, mild iron deficiency: Patient's hemoglobin continues to trend down, and his iron stores continue to be adequate.  He does not require IV Feraheme today, but given his chronic renal insufficiency will benefit from 40,000 units subcutaneous Procrit.  Patient will then return to  clinic every 4 weeks for laboratory work and Procrit only if his hemoglobin is below 10.0.  Return to clinic in 4 months for further evaluation and continuation of treatment. 2.  Chronic renal insufficiency: Chronic and unchanged.  Continue treatment and monitoring per nephrology. 3.  Vascular insufficiency: Continue follow-up with vascular surgery as scheduled. 4.  Back pain: Continue follow-up and treatment per neurosurgery.   Patient expressed understanding and was in agreement with this plan. He also understands that He can call clinic at any time with any questions, concerns, or complaints.    Lloyd Huger, MD   07/23/2019 7:35 AM

## 2019-07-19 ENCOUNTER — Telehealth: Payer: Self-pay | Admitting: Emergency Medicine

## 2019-07-19 NOTE — Telephone Encounter (Signed)
Called patient due to lwot to inquire about condition and follow up plans. Left message.   

## 2019-07-22 ENCOUNTER — Other Ambulatory Visit: Payer: Self-pay

## 2019-07-22 ENCOUNTER — Inpatient Hospital Stay: Payer: Medicare Other

## 2019-07-22 ENCOUNTER — Inpatient Hospital Stay (HOSPITAL_BASED_OUTPATIENT_CLINIC_OR_DEPARTMENT_OTHER): Payer: Medicare Other | Admitting: Oncology

## 2019-07-22 ENCOUNTER — Other Ambulatory Visit: Payer: Self-pay | Admitting: Student in an Organized Health Care Education/Training Program

## 2019-07-22 ENCOUNTER — Inpatient Hospital Stay: Payer: Medicare Other | Attending: Oncology

## 2019-07-22 ENCOUNTER — Encounter: Payer: Self-pay | Admitting: Oncology

## 2019-07-22 VITALS — BP 108/64 | HR 65 | Temp 97.4°F | Ht 72.0 in | Wt 280.0 lb

## 2019-07-22 DIAGNOSIS — I129 Hypertensive chronic kidney disease with stage 1 through stage 4 chronic kidney disease, or unspecified chronic kidney disease: Secondary | ICD-10-CM | POA: Diagnosis not present

## 2019-07-22 DIAGNOSIS — E1122 Type 2 diabetes mellitus with diabetic chronic kidney disease: Secondary | ICD-10-CM | POA: Insufficient documentation

## 2019-07-22 DIAGNOSIS — Z79899 Other long term (current) drug therapy: Secondary | ICD-10-CM | POA: Diagnosis not present

## 2019-07-22 DIAGNOSIS — N189 Chronic kidney disease, unspecified: Secondary | ICD-10-CM | POA: Diagnosis not present

## 2019-07-22 DIAGNOSIS — M549 Dorsalgia, unspecified: Secondary | ICD-10-CM | POA: Diagnosis not present

## 2019-07-22 DIAGNOSIS — D631 Anemia in chronic kidney disease: Secondary | ICD-10-CM | POA: Insufficient documentation

## 2019-07-22 DIAGNOSIS — D509 Iron deficiency anemia, unspecified: Secondary | ICD-10-CM

## 2019-07-22 DIAGNOSIS — D649 Anemia, unspecified: Secondary | ICD-10-CM

## 2019-07-22 DIAGNOSIS — Z905 Acquired absence of kidney: Secondary | ICD-10-CM | POA: Insufficient documentation

## 2019-07-22 LAB — CBC WITH DIFFERENTIAL/PLATELET
Abs Immature Granulocytes: 0.04 10*3/uL (ref 0.00–0.07)
Basophils Absolute: 0 10*3/uL (ref 0.0–0.1)
Basophils Relative: 1 %
Eosinophils Absolute: 0.2 10*3/uL (ref 0.0–0.5)
Eosinophils Relative: 2 %
HCT: 28.4 % — ABNORMAL LOW (ref 39.0–52.0)
Hemoglobin: 8.7 g/dL — ABNORMAL LOW (ref 13.0–17.0)
Immature Granulocytes: 1 %
Lymphocytes Relative: 13 %
Lymphs Abs: 0.9 10*3/uL (ref 0.7–4.0)
MCH: 29.3 pg (ref 26.0–34.0)
MCHC: 30.6 g/dL (ref 30.0–36.0)
MCV: 95.6 fL (ref 80.0–100.0)
Monocytes Absolute: 0.3 10*3/uL (ref 0.1–1.0)
Monocytes Relative: 5 %
Neutro Abs: 5.2 10*3/uL (ref 1.7–7.7)
Neutrophils Relative %: 78 %
Platelets: 214 10*3/uL (ref 150–400)
RBC: 2.97 MIL/uL — ABNORMAL LOW (ref 4.22–5.81)
RDW: 15.2 % (ref 11.5–15.5)
WBC: 6.6 10*3/uL (ref 4.0–10.5)
nRBC: 0 % (ref 0.0–0.2)

## 2019-07-22 LAB — IRON AND TIBC
Iron: 41 ug/dL — ABNORMAL LOW (ref 45–182)
Saturation Ratios: 13 % — ABNORMAL LOW (ref 17.9–39.5)
TIBC: 314 ug/dL (ref 250–450)
UIBC: 273 ug/dL

## 2019-07-22 LAB — FERRITIN: Ferritin: 60 ng/mL (ref 24–336)

## 2019-07-22 MED ORDER — EPOETIN ALFA 40000 UNIT/ML IJ SOLN
40000.0000 [IU] | Freq: Once | INTRAMUSCULAR | Status: AC
  Administered 2019-07-22: 11:00:00 40000 [IU] via SUBCUTANEOUS
  Filled 2019-07-22: qty 1

## 2019-07-22 MED ORDER — SODIUM CHLORIDE 0.9 % IV SOLN
510.0000 mg | Freq: Once | INTRAVENOUS | Status: DC
  Filled 2019-07-22: qty 17

## 2019-07-22 NOTE — Progress Notes (Signed)
Patient stated that he had been at the hospital due to his potassium being elevated. Patient also stated that he had been having back pain.

## 2019-07-27 ENCOUNTER — Telehealth (INDEPENDENT_AMBULATORY_CARE_PROVIDER_SITE_OTHER): Payer: Self-pay | Admitting: Vascular Surgery

## 2019-07-27 ENCOUNTER — Encounter: Payer: Self-pay | Admitting: Student in an Organized Health Care Education/Training Program

## 2019-07-27 NOTE — Telephone Encounter (Signed)
Let's bring him in tomorrow, he will likely need wraps.

## 2019-07-27 NOTE — Telephone Encounter (Signed)
Patient is scheduled with Eulogio Ditch on 07-28-19.

## 2019-07-28 ENCOUNTER — Ambulatory Visit
Payer: Medicare Other | Attending: Student in an Organized Health Care Education/Training Program | Admitting: Student in an Organized Health Care Education/Training Program

## 2019-07-28 ENCOUNTER — Encounter (INDEPENDENT_AMBULATORY_CARE_PROVIDER_SITE_OTHER): Payer: Self-pay | Admitting: Nurse Practitioner

## 2019-07-28 ENCOUNTER — Ambulatory Visit (INDEPENDENT_AMBULATORY_CARE_PROVIDER_SITE_OTHER): Payer: Medicare Other | Admitting: Nurse Practitioner

## 2019-07-28 ENCOUNTER — Encounter: Payer: Self-pay | Admitting: Student in an Organized Health Care Education/Training Program

## 2019-07-28 ENCOUNTER — Other Ambulatory Visit: Payer: Self-pay

## 2019-07-28 VITALS — BP 149/68 | HR 82 | Resp 14 | Ht 72.0 in | Wt 284.0 lb

## 2019-07-28 DIAGNOSIS — N189 Chronic kidney disease, unspecified: Secondary | ICD-10-CM

## 2019-07-28 DIAGNOSIS — M48061 Spinal stenosis, lumbar region without neurogenic claudication: Secondary | ICD-10-CM

## 2019-07-28 DIAGNOSIS — I890 Lymphedema, not elsewhere classified: Secondary | ICD-10-CM

## 2019-07-28 DIAGNOSIS — M47816 Spondylosis without myelopathy or radiculopathy, lumbar region: Secondary | ICD-10-CM

## 2019-07-28 DIAGNOSIS — G894 Chronic pain syndrome: Secondary | ICD-10-CM | POA: Diagnosis not present

## 2019-07-28 DIAGNOSIS — I10 Essential (primary) hypertension: Secondary | ICD-10-CM

## 2019-07-28 DIAGNOSIS — M5416 Radiculopathy, lumbar region: Secondary | ICD-10-CM | POA: Diagnosis not present

## 2019-07-28 DIAGNOSIS — I83893 Varicose veins of bilateral lower extremities with other complications: Secondary | ICD-10-CM

## 2019-07-28 DIAGNOSIS — M48062 Spinal stenosis, lumbar region with neurogenic claudication: Secondary | ICD-10-CM

## 2019-07-28 DIAGNOSIS — M5136 Other intervertebral disc degeneration, lumbar region: Secondary | ICD-10-CM | POA: Diagnosis not present

## 2019-07-28 DIAGNOSIS — M4326 Fusion of spine, lumbar region: Secondary | ICD-10-CM

## 2019-07-28 DIAGNOSIS — M9983 Other biomechanical lesions of lumbar region: Secondary | ICD-10-CM

## 2019-07-28 DIAGNOSIS — M51369 Other intervertebral disc degeneration, lumbar region without mention of lumbar back pain or lower extremity pain: Secondary | ICD-10-CM

## 2019-07-28 MED ORDER — OXYCODONE-ACETAMINOPHEN 10-325 MG PO TABS: 1.0000 | ORAL_TABLET | Freq: Two times a day (BID) | ORAL | 0 refills | Status: DC | PRN

## 2019-07-28 NOTE — Progress Notes (Signed)
Management Virtual Encounter Note - Virtual Visit via [place] (real-time audio visits between healthcare provider and patient).   Patient's Phone No. & Preferred Pharmacy:  [phone] (home); [phone] (mobile); (Preferred) [phone] No e-mail address on record  [id number] [address], [place] [address] [place] [address] Phone: [phone] Fax: [phone]    Pre-screening note:  Our staff contacted [name] and offered him an "in person", "face-to-face" appointment versus a telephone encounter. He indicated preferring the telephone encounter, at this time.   Reason for Virtual Visit: COVID-19*  Social distancing based on CDC and AMA recommendations.   I contacted [name] on 07/28/2019 via video conference.      I clearly identified myself as Gillis Santa, MD. I verified that I was speaking with the correct person using two identifiers (Name: [name], and date of birth: 01-04-1946).  Advanced Informed Consent I sought verbal advanced consent from Venetia Constable for virtual visit interactions. I informed Mr. Gillen of possible security and privacy concerns, risks, and limitations associated with providing "not-in-person" medical evaluation and management services. I also informed [name] of the availability of "in-person" appointments. Finally, I informed him that there would be a charge for the virtual visit and that he could be  personally, fully or partially, financially responsible for it. [name] expressed understanding and agreed to proceed.   Historic Elements   [name] is a 73 y.o. year old, male patient evaluated today after his last encounter by our practice on 07/22/2019. [name]  has a past medical history of Chronic back pain, COPD (chronic obstructive pulmonary disease) ([place]), Depression, Diabetes mellitus without complication ([place]),  Hyperlipidemia, Hypertension, Renal cell carcinoma ([place]), and Sleep apnea. He also  has a past surgical history that includes colonoscopys; Upper gi endoscopy; Flexible sigmoidoscopy; Gastric bypass; Nephrectomy; open reduction and internal fixation, right distal radius; Carpal tunnel release; right knee arthroscopy; Esophagogastroduodenoscopy (egd) with propofol (N/A, 07/29/2017); Colonoscopy with propofol (N/A, 07/29/2017); and Cholecystectomy. [name] has a current medication list which includes the following prescription(s): apixaban, atorvastatin, carvedilol, duloxetine, fluticasone, furosemide, gabapentin, glipizide, pantoprazole, paroxetine, pramipexole, sucralfate, tizanidine, anoro ellipta, multivitamin, oxycodone-acetaminophen, sodium zirconium cyclosilicate, and tramadol. He  reports that he has quit smoking. He has a 30.00 pack-year smoking history. His smokeless tobacco use includes chew. He reports that he does not drink alcohol or use drugs. [name] has No Known Allergies.   HPI  Today, he is being contacted for medication management.   Admitted to hospital for hyperkalemia secondary to CKD.  Patient being contacted for medication management.  Patient is on gabapentin 300 mg twice daily which she does not find any significant benefit from.  He states that he has not noticed any difference in his leg pain or weakness since trying gabapentin.  His primary pain complaint is having weakness as he starts to walk.  I explained to him that this is a result of his severe spinal stenosis.  Unfortunately there are not great therapeutic options for this.  We have tried a diagnostic lumbar epidural steroid injection in the past which was not effective.  I do not recommend repeating in the context of the patient being on Eliquis and having a history of PE and DVT in being high risk to be offered elective procedure that was not effective prior.  Patient is limited on medications however we had a  discussion about best ways to manage his pain.  Physical  therapy is something that I think would be beneficial for the patient particularly [facility] spine rehab.  We will inquire with [facility] to see if this is a possibility.  We also discussed low-dose opioid management to help with his pain symptoms and his suffering.  Patient has tried Percocet 5 mg in the past which he states was not effective.  Consider trying 10 mg twice daily as needed severe pain.  Like to avoid any long-acting medications such as buprenorphine, extended release oxycodone or morphine given his medical history which includes chronic kidney disease.  Fentanyl patch could be a reasonable option given its nonrenal metabolism however its risk of dependency is high and I would like to avoid that in this patient.  Pharmacotherapy Assessment   01/24/2019  2   01/24/2019  Oxycodone-Acetaminophen 5-325  20.00 7 [name]   [id number]   [place] ([id number])   0  21.43 MME  Private Pay   [place]     Monitoring: Pharmacotherapy: No side-effects or adverse reactions reported. [place] PMP: PDMP reviewed during this encounter.       Compliance: No problems identified. Effectiveness: Clinically acceptable. Plan: Refer to "POC".  UDS: No results found for: SUMMARY Laboratory Chemistry Profile (12 mo)  Renal: 07/15/2019: BUN 42; Creatinine, Ser 1.77  Lab Results  Component Value Date   GFRAA 44 (L) 07/15/2019   GFRNONAA 38 (L) 07/15/2019   Hepatic: 07/05/2019: Albumin 3.8 Lab Results  Component Value Date   AST 27 07/05/2019   ALT 35 07/05/2019   Other: No results found for requested labs within last 8760 hours. Note: Above Lab results reviewed.  Imaging  Last 90 days:  Dg Chest 2 View  Result Date: 07/07/2019 CLINICAL DATA:  Cough.  History of renal cell carcinoma. EXAM: CHEST - 2 VIEW COMPARISON:  January 24, 2019 FINDINGS: Stable mild cardiomegaly. The hila and mediastinum are unremarkable. No pneumothorax. No pulmonary nodules  or masses. Minimal atelectasis in the left base. No focal infiltrates. IMPRESSION: No active cardiopulmonary disease. Electronically Signed   By: Dorise Bullion III M.D   On: 07/07/2019 15:18   US Renal  Result Date: 07/06/2019 CLINICAL DATA:  Acute renal failure. EXAM: RENAL / URINARY TRACT ULTRASOUND COMPLETE COMPARISON:  CT 01/27/2014 FINDINGS: Right Kidney: Renal measurements: 13.1 x 6.6 x 7.4 cm = volume: 334 mL . Echogenicity within normal limits. No mass or hydronephrosis visualized. 2.5 cm cyst over the upper pole. Left Kidney: Previous left nephrectomy. Bladder: Appears normal for degree of bladder distention. Right-sided ureteral jet visualized. IMPRESSION: Normal size right kidney without hydronephrosis. 2.5 cm cyst over the upper pole. Previous left nephrectomy. Electronically Signed   By: Marin Olp M.D.   On: 07/06/2019 15:44   [facility] C-arm 1-60 Min No Report  Result Date: 06/13/2019 Fluoro was used, but no Radiologist interpretation will be provided. Please refer to "NOTES" tab for provider progress note.  Assessment  The primary encounter diagnosis was Lumbar radiculopathy. 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  1. Lumbar radiculopathy   2. Chronic pain syndrome   3. Lumbar degenerative disc disease   4. 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 Ankylosis of lumbar spine  6. Neuroforaminal stenosis of lumbar spine (left, L5/S1)   7. Spinal stenosis, lumbar region, with neurogenic claudication   8. Lumbar facet arthropathy   9. Chronic kidney disease, unspecified CKD stage    Plan of Care  I am  having [name] start on oxyCODONE-acetaminophen. I am also having him maintain his apixaban, carvedilol, multivitamin, pantoprazole, PARoxetine, sucralfate, gabapentin, traMADol, pramipexole, Anoro Ellipta, atorvastatin, fluticasone, DULoxetine, furosemide, glipiZIDE, sodium zirconium cyclosilicate, and tiZANidine.  At this point, I do not have any interventional therapies to offer the patient in the context of his severe spinal stenosis and lower extremity weakness.  Patient is on Eliquis and high risk to be off given history of PE DVT.  Furthermore patient's chronic kidney disease and reduced GFR limits ability to titrate many analgesic medications including gabapentin and Lyrica which she has tried in the past.  Patient is also tried and failed muscle relaxers.  We had an extensive discussion about medication management and considering low-dose opioid therapy as detailed above.  Patient would like to avoid this at the same time he continues to suffer and have reduced quality of life.  Patient states that he has an appointment with Dr. Arnoldo Morale in [facility] neurosurgery for second opinion.  We also discussed the importance of physical therapy and considering McKenzie spine techniques for spinal stenosis.  We will send referral to [facility] which do [facility]enzie techniques.  Also prescription for Percocet as below 10 mg to take twice daily as needed.  Furthermore since gabapentin not effective instructed patient to discontinue.  Orders Placed This Encounter  Procedures  . Ambulatory referral to Physical Therapy    Referral Priority:   Routine    Referral Type:   Physical Medicine    Referral Reason:   Specialty Services Required    Requested Specialty:   Physical Therapy    Number of Visits Requested:   1   Requested Prescriptions   Signed Prescriptions Disp Refills  . oxyCODONE-acetaminophen (PERCOCET) 10-325 MG tablet 60 tablet 0    Sig: Take 1 tablet by mouth 2  (two) times daily as needed for pain. Must last 30 days.    Follow-up plan:   Return in about 4 weeks (around 08/25/2019) for Medication Management, virtual.     Recent Visits Date Type Provider Dept  06/30/19 Office Visit Gillis Santa, MD [facility]  06/08/19 Procedure visit Gillis Santa, MD [facility]  05/11/19 Office Visit Gillis Santa, MD [facility]  Showing recent visits within past 90 days and meeting all other requirements   Today's Visits Date Type Provider Dept  07/28/19 Office Visit Gillis Santa, MD [facility]  Showing today's visits and meeting all other requirements   Future Appointments No visits were found meeting these conditions.  Showing future appointments within next 90 days and meeting all other requirements   I discussed the assessment and treatment plan with the patient. The patient was provided an opportunity to ask questions and all were answered. The patient agreed with the plan and demonstrated an understanding of the instructions.  Patient advised to call back or seek an in-person evaluation if the symptoms or condition worsens.  Total duration of non-face-to-face encounter: 25 minutes.  Note by: Gillis Santa, MD Date: 07/28/2019; Time: 9:51 AM  Note: This dictation was prepared with Dragon dictation. Any transcriptional errors that may result from this process are unintentional.  Disclaimer:  * Given the special circumstances of the COVID-19 pandemic, the federal  government has announced that [employer] ([employer]) will exercise its enforcement discretion and will not impose penalties on physicians using telehealth in the event of noncompliance with regulatory requirements under the [place] (HIPAA) in connection with the good faith provision of telehealth during the TSSQS-47 national public health emergency. ([place])

## 2019-08-01 NOTE — Progress Notes (Signed)
SUBJECTIVE:  Patient ID: [name], male    DOB: 1946-03-07, 73 y.o.   MRN: [id number] Chief Complaint  Patient presents with  . Follow-up    HPI  [name] is a 73 y.o. male that complains of redness and swelling of his bilateral lower extremities.  However the left leg is worse than the right.  The patient endorses having been working in the yard a lot more lately and he has not been wearing his compression socks as much.  He has been using his lymphedema pump twice a day however.  He denies any fever, chills, nausea, vomiting or diarrhea.  Denies any chest pain or shortness of breath.  Patient has a previous history of bilateral endovenous ablation.  Past Medical History:  Diagnosis Date  . Chronic back pain   . COPD (chronic obstructive pulmonary disease) ([place])   . Depression   . Diabetes mellitus without complication ([place])   . Hyperlipidemia   . Hypertension   . Renal cell carcinoma ([place])   . Sleep apnea     Past Surgical History:  Procedure Laterality Date  . CARPAL TUNNEL RELEASE    . CHOLECYSTECTOMY    . COLONOSCOPY WITH PROPOFOL N/A 07/29/2017   Procedure: COLONOSCOPY WITH PROPOFOL;  Surgeon: Manya Silvas, MD;  Location: [facility] ENDOSCOPY;  Service: Endoscopy;  Laterality: N/A;  . colonoscopys    . ESOPHAGOGASTRODUODENOSCOPY (EGD) WITH PROPOFOL N/A 07/29/2017   Procedure: ESOPHAGOGASTRODUODENOSCOPY (EGD) WITH PROPOFOL;  Surgeon: Manya Silvas, MD;  Location: [facility] ENDOSCOPY;  Service: Endoscopy;  Laterality: N/A;  . FLEXIBLE SIGMOIDOSCOPY    . GASTRIC BYPASS    . NEPHRECTOMY    . open reduction and internal fixation, right distal radius    . right knee arthroscopy    . UPPER GI ENDOSCOPY      Social History   Socioeconomic History  . Marital status: Married    Spouse name: Jackelyn Poling  . Number of children: Not on file  . Years of education: Not on file  . Highest education level: Not on file  Occupational History  . Occupation:  Retired    Comment: VP of [employer]  Social Needs  . Financial resource strain: Not hard at all  . Food insecurity    Worry: Never true    Inability: Never true  . Transportation needs    Medical: No    Non-medical: No  Tobacco Use  . Smoking status: Former Smoker    Packs/day: 1.00    Years: 30.00    Pack years: 30.00  . Smokeless tobacco: Current User    Types: Chew  Substance and Sexual Activity  . Alcohol use: No    Frequency: Never  . Drug use: No  . Sexual activity: Yes  Lifestyle  . Physical activity    Days per week: 5 days    Minutes per session: 150+ min  . Stress: Not at all  Relationships  . Social connections    Talks on phone: More than three times a week    Gets together: More than three times a week    Attends religious service: More than 4 times per year    Active member of club or organization: No    Attends meetings of clubs or organizations: Not on file    Relationship status: Married  . Intimate partner violence    Fear of current or ex partner: No    Emotionally abused: No    Physically abused: No  Forced sexual activity: No  Other Topics Concern  . Not on file  Social History Narrative  . Not on file    Family History  Problem Relation Age of Onset  . Diabetes Mother   . Cancer Mother   . Heart disease Father     No Known Allergies   Review of Systems   Review of Systems: Negative Unless Checked Constitutional: [] Weight loss  [] Fever  [] Chills Cardiac: [] Chest pain   []  Atrial Fibrillation  [] Palpitations   [] Shortness of breath when laying flat   [] Shortness of breath with exertion. [] Shortness of breath at rest Vascular:  [] Pain in legs with walking   [] Pain in legs with standing [] Pain in legs when laying flat   [] Claudication    [] Pain in feet when laying flat    [] History of DVT   [] Phlebitis   [x] Swelling in legs   [x] Varicose veins   [] Non-healing ulcers Pulmonary:   [] Uses home oxygen   [] Productive cough   [] Hemoptysis    [] Wheeze  [x] COPD   [] Asthma Neurologic:  [] Dizziness   [] Seizures  [] Blackouts [] History of stroke   [] History of TIA  [] Aphasia   [] Temporary Blindness   [] Weakness or numbness in arm   [] Weakness or numbness in leg Musculoskeletal:   [] Joint swelling   [] Joint pain   [x] Low back pain  []  History of Knee Replacement [] Arthritis [] back Surgeries  []  Spinal Stenosis    Hematologic:  [] Easy bruising  [] Easy bleeding   [] Hypercoagulable state   [] Anemic Gastrointestinal:  [] Diarrhea   [] Vomiting  [] Gastroesophageal reflux/heartburn   [] Difficulty swallowing. [] Abdominal pain Genitourinary:  [] Chronic kidney disease   [] Difficult urination  [] Anuric   [] Blood in urine [] Frequent urination  [] Burning with urination   [] Hematuria Skin:  [] Rashes   [] Ulcers [] Wounds Psychological:  [x] History of anxiety   []  History of major depression  []  Memory Difficulties      OBJECTIVE:   Physical Exam  BP (!) 149/68 (BP Location: Left Arm, Patient Position: Sitting, Cuff Size: Large)   Pulse 82   Resp 14   Ht 6' (1.829 m)   Wt 284 lb (128.8 kg)   BMI 38.52 kg/m   Gen: WD/WN, NAD Head: [place]/AT, No temporalis wasting.  Ear/Nose/Throat: Hearing grossly intact, nares w/o erythema or drainage Eyes: PER, EOMI, sclera nonicteric.  Neck: Supple, no masses.  No JVD.  Pulmonary:  Good air movement, no use of accessory muscles.  Cardiac: RRR Vascular:  3+ pitting edema bilaterally with weeping Vessel Right Left  Radial Palpable Palpable  Dorsalis Pedis Palpable Palpable  Posterior Tibial Palpable Palpable   Gastrointestinal: soft, non-distended. No guarding/no peritoneal signs.  Musculoskeletal: M/S 5/5 throughout.  No deformity or atrophy.  Neurologic: Pain and light touch intact in extremities.  Symmetrical.  Speech is fluent. Motor exam as listed above. Psychiatric: Judgment intact, Mood & affect appropriate for pt's clinical situation. Dermatologic: No Venous rashes. No Ulcers Noted.  No changes  consistent with cellulitis. Lymph : No Cervical lymphadenopathy, dermal thickening bilaterally.       ASSESSMENT AND PLAN:  1. Lymphedema I have advised the patient that he should continue to wear his medical grade 1 compression stockings on a daily basis.  We have also placed the patient in Unna wrap on the left lower extremity due to the fact that this leg was much worse and it was also weeping.  The patient will continue to have them changed weekly in the office.  We will have the patient  follow-up in 3 to 4 weeks with the venous reflux study.  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  We will repeat a venous reflux study to ensure that there has not been any recannulization of the great saphenous veins bilaterally 3. Essential hypertension Continue antihypertensive medications as already ordered, these medications have been reviewed and there are no changes at this time.   Current Outpatient Medications on File Prior to Visit  Medication Sig Dispense Refill  . apixaban (ELIQUIS) 5 MG TABS tablet Take 5 mg by mouth every 12 (twelve) hours.     Marland Kitchen atorvastatin (LIPITOR) 10 MG tablet Take 10 mg by mouth daily.    . carvedilol (COREG) 6.25 MG tablet Take 6.25 mg by mouth 2 (two) times daily with a meal.    . DULoxetine (CYMBALTA) 30 MG capsule Take 30 mg by mouth daily.    . furosemide (LASIX) 40 MG tablet Take 1 tablet (40 mg total) by mouth daily. 30 tablet 0  . glipiZIDE (GLUCOTROL XL) 2.5 MG 24 hr tablet Take 1 tablet (2.5 mg total) by mouth daily with breakfast. 30 tablet 0  . pantoprazole (PROTONIX) 40 MG tablet Take 40 mg by mouth 2 (two) times daily.     Marland Kitchen PARoxetine (PAXIL) 40 MG tablet Take 40 mg by mouth every morning.     . pramipexole (MIRAPEX) 1 MG tablet Take 1 mg by mouth 2 (two) times a day.     . sucralfate (CARAFATE) 1 g tablet Take 1 g by mouth 3 (three) times daily.     Marland Kitchen  tiZANidine (ZANAFLEX) 4 MG tablet Take 4 mg by mouth every 8 (eight) hours as needed for muscle spasms.    Jearl Klinefelter ELLIPTA 62.5-25 MCG/INH AEPB Inhale 1 puff into the lungs daily.     . fluticasone (FLONASE) 50 MCG/ACT nasal spray Place 1 spray into both nostrils 2 (two) times a day.    . gabapentin (NEURONTIN) 300 MG capsule Take 300 mg by mouth 2 (two) times daily.     . Multiple Vitamin (MULTIVITAMIN) tablet Take 1 tablet by mouth daily.    Marland Kitchen oxyCODONE-acetaminophen (PERCOCET) 10-325 MG tablet Take 1 tablet by mouth 2 (two) times daily as needed for pain. Must last 30 days. (Patient not taking: Reported on 07/28/2019) 60 tablet 0  . sodium zirconium cyclosilicate (LOKELMA) 10 g PACK packet Take 10 g by mouth 2 (two) times daily. (Patient not taking: Reported on 07/27/2019) 60 packet 0  . traMADol (ULTRAM) 50 MG tablet Take 50 mg by mouth every 6 (six) hours as needed.     No current facility-administered medications on file prior to visit.     There are no Patient Instructions on file for this visit. No follow-ups on file.   Kris Hartmann, NP  This note was completed with Sales executive.  Any errors are purely unintentional.

## 2019-08-04 ENCOUNTER — Encounter (INDEPENDENT_AMBULATORY_CARE_PROVIDER_SITE_OTHER): Payer: Self-pay

## 2019-08-04 ENCOUNTER — Other Ambulatory Visit: Payer: Self-pay

## 2019-08-04 ENCOUNTER — Ambulatory Visit (INDEPENDENT_AMBULATORY_CARE_PROVIDER_SITE_OTHER): Payer: Medicare Other | Admitting: Nurse Practitioner

## 2019-08-04 VITALS — BP 121/61 | HR 76 | Resp 16 | Wt 280.0 lb

## 2019-08-04 DIAGNOSIS — I890 Lymphedema, not elsewhere classified: Secondary | ICD-10-CM | POA: Diagnosis not present

## 2019-08-04 NOTE — Progress Notes (Signed)
History of Present Illness  There is no documented history at this time  Assessments & Plan   There are no diagnoses linked to this encounter.    Additional instructions  Subjective:  Patient presents with venous ulcer of the Bilateral lower extremity.    Procedure:  3 layer unna wrap was placed Bilateral lower extremity.   Plan:   Follow up in one week.  

## 2019-08-11 ENCOUNTER — Ambulatory Visit (INDEPENDENT_AMBULATORY_CARE_PROVIDER_SITE_OTHER): Payer: Medicare Other

## 2019-08-11 ENCOUNTER — Other Ambulatory Visit: Payer: Self-pay

## 2019-08-11 ENCOUNTER — Encounter (INDEPENDENT_AMBULATORY_CARE_PROVIDER_SITE_OTHER): Payer: Self-pay

## 2019-08-11 ENCOUNTER — Encounter (INDEPENDENT_AMBULATORY_CARE_PROVIDER_SITE_OTHER): Payer: Self-pay | Admitting: Nurse Practitioner

## 2019-08-11 ENCOUNTER — Ambulatory Visit (INDEPENDENT_AMBULATORY_CARE_PROVIDER_SITE_OTHER): Payer: Medicare Other | Admitting: Nurse Practitioner

## 2019-08-11 ENCOUNTER — Other Ambulatory Visit (INDEPENDENT_AMBULATORY_CARE_PROVIDER_SITE_OTHER): Payer: Self-pay | Admitting: Nurse Practitioner

## 2019-08-11 ENCOUNTER — Encounter (INDEPENDENT_AMBULATORY_CARE_PROVIDER_SITE_OTHER): Payer: Medicare Other

## 2019-08-11 VITALS — BP 142/74 | HR 85 | Resp 12 | Ht 72.0 in | Wt 277.0 lb

## 2019-08-11 DIAGNOSIS — M79604 Pain in right leg: Secondary | ICD-10-CM

## 2019-08-11 DIAGNOSIS — I10 Essential (primary) hypertension: Secondary | ICD-10-CM | POA: Diagnosis not present

## 2019-08-11 DIAGNOSIS — I890 Lymphedema, not elsewhere classified: Secondary | ICD-10-CM | POA: Diagnosis not present

## 2019-08-11 DIAGNOSIS — M79605 Pain in left leg: Secondary | ICD-10-CM | POA: Diagnosis not present

## 2019-08-11 DIAGNOSIS — I83893 Varicose veins of bilateral lower extremities with other complications: Secondary | ICD-10-CM | POA: Diagnosis not present

## 2019-08-11 NOTE — Progress Notes (Signed)
SUBJECTIVE:  Patient ID: [name], male    DOB: [birth year], 73 y.o.   MRN: [id number] Chief Complaint  Patient presents with  . Follow-up    HPI  [name] is a 73 y.o. male that presents today for follow-up regarding leg swelling.  The patient has recently noted that his lower extremities are had started to swell more and he subsequently had some weeping.  The patient was placed in [place] wraps for approximately 2 weeks.  The patient endorses wearing his medical grade 1 compression socks although he does state that lately he has not been wearing them as much due to the heat.  He also is not consistent with usage of his lymphedema pump.  He denies any fever, chills, nausea, vomiting or diarrhea.  Noninvasive studies show there is no evidence of DVT, superficial venous thrombosis, or chronic venous insufficiency.  The great saphenous vein ablation is successful in the bilateral lower extremities.  Past Medical History:  Diagnosis Date  . Chronic back pain   . COPD (chronic obstructive pulmonary disease) ([place])   . Depression   . Diabetes mellitus without complication ([place])   . Hyperlipidemia   . Hypertension   . Renal cell carcinoma ([place])   . Sleep apnea     Past Surgical History:  Procedure Laterality Date  . CARPAL TUNNEL RELEASE    . CHOLECYSTECTOMY    . COLONOSCOPY WITH PROPOFOL N/A 07/29/2017   Procedure: COLONOSCOPY WITH PROPOFOL;  Surgeon: Manya Silvas, MD;  Location: [facility] ENDOSCOPY;  Service: Endoscopy;  Laterality: N/A;  . colonoscopys    . ESOPHAGOGASTRODUODENOSCOPY (EGD) WITH PROPOFOL N/A 07/29/2017   Procedure: ESOPHAGOGASTRODUODENOSCOPY (EGD) WITH PROPOFOL;  Surgeon: Manya Silvas, MD;  Location: [facility] ENDOSCOPY;  Service: Endoscopy;  Laterality: N/A;  . FLEXIBLE SIGMOIDOSCOPY    . GASTRIC BYPASS    . NEPHRECTOMY    . open reduction and internal fixation, right distal radius    . right knee arthroscopy    . UPPER GI ENDOSCOPY       Social History   Socioeconomic History  . Marital status: Married    Spouse name: Jackelyn Poling  . Number of children: Not on file  . Years of education: Not on file  . Highest education level: Not on file  Occupational History  . Occupation: Retired    Comment: VP of [employer]  Social Needs  . Financial resource strain: Not hard at all  . Food insecurity    Worry: Never true    Inability: Never true  . Transportation needs    Medical: No    Non-medical: No  Tobacco Use  . Smoking status: Former Smoker    Packs/day: 1.00    Years: 30.00    Pack years: 30.00  . Smokeless tobacco: Current User    Types: Chew  Substance and Sexual Activity  . Alcohol use: No    Frequency: Never  . Drug use: No  . Sexual activity: Yes  Lifestyle  . Physical activity    Days per week: 5 days    Minutes per session: 150+ min  . Stress: Not at all  Relationships  . Social connections    Talks on phone: More than three times a week    Gets together: More than three times a week    Attends religious service: More than 4 times per year    Active member of club or organization: No    Attends meetings of clubs or organizations: Not  on file    Relationship status: Married  . Intimate partner violence    Fear of current or ex partner: No    Emotionally abused: No    Physically abused: No    Forced sexual activity: No  Other Topics Concern  . Not on file  Social History Narrative  . Not on file    Family History  Problem Relation Age of Onset  . Diabetes Mother   . Cancer Mother   . Heart disease Father     No Known Allergies   Review of Systems   Review of Systems: Negative Unless Checked Constitutional: [] Weight loss  [] Fever  [] Chills Cardiac: [] Chest pain   []  Atrial Fibrillation  [] Palpitations   [] Shortness of breath when laying flat   [] Shortness of breath with exertion. [] Shortness of breath at rest Vascular:  [] Pain in legs with walking   [] Pain in legs with standing [] Pain in  legs when laying flat   [] Claudication    [] Pain in feet when laying flat    [] History of DVT   [] Phlebitis   [] Swelling in legs   [] Varicose veins   [] Non-healing ulcers Pulmonary:   [] Uses home oxygen   [] Productive cough   [] Hemoptysis   [] Wheeze  [x] COPD   [] Asthma Neurologic:  [] Dizziness   [] Seizures  [] Blackouts [] History of stroke   [] History of TIA  [] Aphasia   [] Temporary Blindness   [] Weakness or numbness in arm   [] Weakness or numbness in leg Musculoskeletal:   [] Joint swelling   [] Joint pain   [x] Low back pain  []  History of Knee Replacement [] Arthritis [] back Surgeries  []  Spinal Stenosis    Hematologic:  [] Easy bruising  [] Easy bleeding   [] Hypercoagulable state   [] Anemic Gastrointestinal:  [] Diarrhea   [] Vomiting  [] Gastroesophageal reflux/heartburn   [] Difficulty swallowing. [] Abdominal pain Genitourinary:  [] Chronic kidney disease   [] Difficult urination  [] Anuric   [] Blood in urine [] Frequent urination  [] Burning with urination   [] Hematuria Skin:  [x] Rashes   [] Ulcers [] Wounds Psychological:  [] History of anxiety   []  History of major depression  []  Memory Difficulties      OBJECTIVE:   Physical Exam  BP (!) 142/74 (BP Location: Left Arm, Patient Position: Sitting, Cuff Size: Large)   Pulse 85   Resp 12   Ht 6' (1.829 m)   Wt 277 lb (125.6 kg)   BMI 37.57 kg/m   Gen: WD/WN, NAD Head: [place]/AT, No temporalis wasting.  Ear/Nose/Throat: Hearing grossly intact, nares w/o erythema or drainage Eyes: PER, EOMI, sclera nonicteric.  Neck: Supple, no masses.  No JVD.  Pulmonary:  Good air movement, no use of accessory muscles.  Cardiac: RRR Vascular: 3+ edema left and 2+ right Vessel Right Left  Radial Palpable Palpable  Dorsalis Pedis Palpable Palpable  Posterior Tibial Palpable Palpable   Gastrointestinal: soft, non-distended. No guarding/no peritoneal signs.  Musculoskeletal: M/S 5/5 throughout.  No deformity or atrophy.  Neurologic: Pain and light touch intact in  extremities.  Symmetrical.  Speech is fluent. Motor exam as listed above. Psychiatric: Judgment intact, Mood & affect appropriate for pt's clinical situation. Dermatologic:  Bilateral stasis dermatitis no Ulcers Noted.  No changes consistent with cellulitis. Lymph : No Cervical lymphadenopathy, no lichenification or skin changes of chronic lymphedema.       ASSESSMENT AND PLAN:  1. Lymphedema The patient's persistent swelling is likely due to lymphedema and chronic scarring of the lymphatic channels.  Patient will continue Unna wraps, to be changed weekly by  our office.  We will have him return to the office in 4 weeks to reevaluate progress.  The patient was also counseled that following Unna wrap treatment that medical grade 1 compression socks would need to be worn on a daily basis, in addition to behavioral modifications such as elevation and nightly use of his lymphedema pump.  2. Varicose veins of leg with swelling, bilateral Previous endovenous ablation of the bilateral great saphenous veins are intact.  See above for further recommendations.  3. Essential hypertension Continue antihypertensive medications as already ordered, these medications have been reviewed and there are no changes at this time.    Current Outpatient Medications on File Prior to Visit  Medication Sig Dispense Refill  . ANORO ELLIPTA 62.5-25 MCG/INH AEPB Inhale 1 puff into the lungs daily.     Marland Kitchen apixaban (ELIQUIS) 5 MG TABS tablet Take 5 mg by mouth every 12 (twelve) hours.     Marland Kitchen atorvastatin (LIPITOR) 10 MG tablet Take 10 mg by mouth daily.    . carvedilol (COREG) 6.25 MG tablet Take 6.25 mg by mouth 2 (two) times daily with a meal.    . DULoxetine (CYMBALTA) 30 MG capsule Take 30 mg by mouth daily.    . fluticasone (FLONASE) 50 MCG/ACT nasal spray Place 1 spray into both nostrils 2 (two) times a day.    . furosemide (LASIX) 40 MG tablet Take 1 tablet (40 mg total) by mouth daily. 30 tablet 0  . gabapentin  (NEURONTIN) 300 MG capsule Take 300 mg by mouth 2 (two) times daily.     Marland Kitchen glipiZIDE (GLUCOTROL XL) 2.5 MG 24 hr tablet Take 1 tablet (2.5 mg total) by mouth daily with breakfast. 30 tablet 0  . Multiple Vitamin (MULTIVITAMIN) tablet Take 1 tablet by mouth daily.    Marland Kitchen oxyCODONE-acetaminophen (PERCOCET) 10-325 MG tablet Take 1 tablet by mouth 2 (two) times daily as needed for pain. Must last 30 days. 60 tablet 0  . pantoprazole (PROTONIX) 40 MG tablet Take 40 mg by mouth 2 (two) times daily.     Marland Kitchen PARoxetine (PAXIL) 40 MG tablet Take 40 mg by mouth every morning.     . pramipexole (MIRAPEX) 1 MG tablet Take 1 mg by mouth 2 (two) times a day.     . sodium zirconium cyclosilicate (LOKELMA) 10 g PACK packet Take 10 g by mouth 2 (two) times daily. 60 packet 0  . sucralfate (CARAFATE) 1 g tablet Take 1 g by mouth 3 (three) times daily.     Marland Kitchen tiZANidine (ZANAFLEX) 4 MG tablet Take 4 mg by mouth every 8 (eight) hours as needed for muscle spasms.    . traMADol (ULTRAM) 50 MG tablet Take 50 mg by mouth every 6 (six) hours as needed.     No current facility-administered medications on file prior to visit.     There are no Patient Instructions on file for this visit. No follow-ups on file.   Kris Hartmann, NP  This note was completed with Sales executive.  Any errors are purely unintentional.

## 2019-08-12 ENCOUNTER — Ambulatory Visit (INDEPENDENT_AMBULATORY_CARE_PROVIDER_SITE_OTHER): Payer: Medicare Other | Admitting: Nurse Practitioner

## 2019-08-12 ENCOUNTER — Encounter (INDEPENDENT_AMBULATORY_CARE_PROVIDER_SITE_OTHER): Payer: Medicare Other

## 2019-08-18 ENCOUNTER — Encounter (INDEPENDENT_AMBULATORY_CARE_PROVIDER_SITE_OTHER): Payer: Self-pay

## 2019-08-18 ENCOUNTER — Encounter (INDEPENDENT_AMBULATORY_CARE_PROVIDER_SITE_OTHER): Payer: Medicare Other

## 2019-08-18 ENCOUNTER — Ambulatory Visit (INDEPENDENT_AMBULATORY_CARE_PROVIDER_SITE_OTHER): Payer: Medicare Other | Admitting: Vascular Surgery

## 2019-08-18 ENCOUNTER — Other Ambulatory Visit: Payer: Self-pay

## 2019-08-18 ENCOUNTER — Ambulatory Visit (INDEPENDENT_AMBULATORY_CARE_PROVIDER_SITE_OTHER): Payer: Medicare Other | Admitting: Nurse Practitioner

## 2019-08-18 VITALS — BP 159/73 | HR 74 | Resp 12 | Ht 72.0 in | Wt 280.0 lb

## 2019-08-18 DIAGNOSIS — I890 Lymphedema, not elsewhere classified: Secondary | ICD-10-CM

## 2019-08-18 NOTE — Progress Notes (Signed)
Patient presented today for bilateral unna boot change. Patient had removed his unna boot dressings on 08/17/19 and started wearing his compression socks. Patient requested not to have the unna boots placed today because this weekend is labor day weekend and he would like to be out of them for that purpose. I spoke with Arna Medici- she advised that patient should continue unna therapy per her advise but that ultimately it was patients decision. Should patient decide not to continue unna therapy then we suggest he wear his compression socks as prescribed.  Patient stated that he would wanted to leave unna boots off at this point and time and he would contact our office when he decides he wants them back on. AS, CMA

## 2019-08-19 ENCOUNTER — Encounter (INDEPENDENT_AMBULATORY_CARE_PROVIDER_SITE_OTHER): Payer: Self-pay | Admitting: Nurse Practitioner

## 2019-08-23 ENCOUNTER — Other Ambulatory Visit: Payer: Self-pay

## 2019-08-23 ENCOUNTER — Inpatient Hospital Stay: Payer: Medicare Other

## 2019-08-23 ENCOUNTER — Inpatient Hospital Stay: Payer: Medicare Other | Attending: Oncology

## 2019-08-23 VITALS — BP 127/70

## 2019-08-23 DIAGNOSIS — D649 Anemia, unspecified: Secondary | ICD-10-CM

## 2019-08-23 DIAGNOSIS — D631 Anemia in chronic kidney disease: Secondary | ICD-10-CM | POA: Insufficient documentation

## 2019-08-23 DIAGNOSIS — D509 Iron deficiency anemia, unspecified: Secondary | ICD-10-CM

## 2019-08-23 DIAGNOSIS — N189 Chronic kidney disease, unspecified: Secondary | ICD-10-CM | POA: Insufficient documentation

## 2019-08-23 LAB — CBC WITH DIFFERENTIAL/PLATELET
Abs Immature Granulocytes: 0.02 10*3/uL (ref 0.00–0.07)
Basophils Absolute: 0 10*3/uL (ref 0.0–0.1)
Basophils Relative: 0 %
Eosinophils Absolute: 0.2 10*3/uL (ref 0.0–0.5)
Eosinophils Relative: 3 %
HCT: 32.4 % — ABNORMAL LOW (ref 39.0–52.0)
Hemoglobin: 9.8 g/dL — ABNORMAL LOW (ref 13.0–17.0)
Immature Granulocytes: 0 %
Lymphocytes Relative: 14 %
Lymphs Abs: 1.1 10*3/uL (ref 0.7–4.0)
MCH: 27.6 pg (ref 26.0–34.0)
MCHC: 30.2 g/dL (ref 30.0–36.0)
MCV: 91.3 fL (ref 80.0–100.0)
Monocytes Absolute: 0.4 10*3/uL (ref 0.1–1.0)
Monocytes Relative: 5 %
Neutro Abs: 5.8 10*3/uL (ref 1.7–7.7)
Neutrophils Relative %: 78 %
Platelets: 196 10*3/uL (ref 150–400)
RBC: 3.55 MIL/uL — ABNORMAL LOW (ref 4.22–5.81)
RDW: 15.4 % (ref 11.5–15.5)
WBC: 7.5 10*3/uL (ref 4.0–10.5)
nRBC: 0 % (ref 0.0–0.2)

## 2019-08-23 LAB — IRON AND TIBC
Iron: 45 ug/dL (ref 45–182)
Saturation Ratios: 12 % — ABNORMAL LOW (ref 17.9–39.5)
TIBC: 362 ug/dL (ref 250–450)
UIBC: 318 ug/dL

## 2019-08-23 LAB — FERRITIN: Ferritin: 14 ng/mL — ABNORMAL LOW (ref 24–336)

## 2019-08-23 MED ORDER — EPOETIN ALFA 40000 UNIT/ML IJ SOLN
40000.0000 [IU] | Freq: Once | INTRAMUSCULAR | Status: AC
  Administered 2019-08-23: 40000 [IU] via SUBCUTANEOUS
  Filled 2019-08-23: qty 1

## 2019-08-24 ENCOUNTER — Telehealth: Payer: Self-pay | Admitting: *Deleted

## 2019-08-24 NOTE — Telephone Encounter (Signed)
Attempted to call for pre appointment assessment. Message left. 

## 2019-08-25 ENCOUNTER — Other Ambulatory Visit: Payer: Self-pay

## 2019-08-25 ENCOUNTER — Ambulatory Visit
Payer: Medicare Other | Attending: Student in an Organized Health Care Education/Training Program | Admitting: Student in an Organized Health Care Education/Training Program

## 2019-08-25 ENCOUNTER — Encounter: Payer: Self-pay | Admitting: Student in an Organized Health Care Education/Training Program

## 2019-08-25 ENCOUNTER — Encounter (INDEPENDENT_AMBULATORY_CARE_PROVIDER_SITE_OTHER): Payer: Self-pay | Admitting: Nurse Practitioner

## 2019-08-25 ENCOUNTER — Ambulatory Visit (INDEPENDENT_AMBULATORY_CARE_PROVIDER_SITE_OTHER): Payer: Medicare Other | Admitting: Nurse Practitioner

## 2019-08-25 VITALS — BP 128/70 | HR 76 | Resp 12 | Ht 72.0 in | Wt 273.0 lb

## 2019-08-25 DIAGNOSIS — M4326 Fusion of spine, lumbar region: Secondary | ICD-10-CM | POA: Diagnosis not present

## 2019-08-25 DIAGNOSIS — M5416 Radiculopathy, lumbar region: Secondary | ICD-10-CM | POA: Diagnosis not present

## 2019-08-25 DIAGNOSIS — G894 Chronic pain syndrome: Secondary | ICD-10-CM

## 2019-08-25 DIAGNOSIS — M9983 Other biomechanical lesions of lumbar region: Secondary | ICD-10-CM

## 2019-08-25 DIAGNOSIS — M5136 Other intervertebral disc degeneration, lumbar region: Secondary | ICD-10-CM | POA: Diagnosis not present

## 2019-08-25 DIAGNOSIS — M48062 Spinal stenosis, lumbar region with neurogenic claudication: Secondary | ICD-10-CM

## 2019-08-25 DIAGNOSIS — Z86718 Personal history of other venous thrombosis and embolism: Secondary | ICD-10-CM

## 2019-08-25 DIAGNOSIS — M47816 Spondylosis without myelopathy or radiculopathy, lumbar region: Secondary | ICD-10-CM

## 2019-08-25 DIAGNOSIS — I890 Lymphedema, not elsewhere classified: Secondary | ICD-10-CM | POA: Diagnosis not present

## 2019-08-25 DIAGNOSIS — J449 Chronic obstructive pulmonary disease, unspecified: Secondary | ICD-10-CM

## 2019-08-25 DIAGNOSIS — M48061 Spinal stenosis, lumbar region without neurogenic claudication: Secondary | ICD-10-CM

## 2019-08-25 DIAGNOSIS — M51369 Other intervertebral disc degeneration, lumbar region without mention of lumbar back pain or lower extremity pain: Secondary | ICD-10-CM

## 2019-08-25 MED ORDER — OXYCODONE-ACETAMINOPHEN 10-325 MG PO TABS: 1.0000 | ORAL_TABLET | Freq: Two times a day (BID) | ORAL | 0 refills | Status: DC | PRN

## 2019-08-25 NOTE — Progress Notes (Signed)
History of Present Illness  There is no documented history at this time  Assessments & Plan   There are no diagnoses linked to this encounter.    Additional instructions  Subjective:  Patient presents with venous ulcer of the Bilateral lower extremity.    Procedure:  3 layer unna wrap was placed Bilateral lower extremity.   Plan:   Follow up in one week.  

## 2019-08-25 NOTE — Progress Notes (Signed)
Pain Management Virtual Encounter Note - Virtual Visit via [place] (real-time audio visits between healthcare provider and patient).   Patient's Phone No. & Preferred Pharmacy:  [phone] (home); [phone] (mobile); (Preferred) [phone] No e-mail address on record  [id number] Lorina [place]on, [place] [address] [place] [address] Phone: [phone] Fax: [phone]    Pre-screening note:  Our staff contacted [name] and offered him an "in person", "face-to-face" appointment versus a telephone encounter. He indicated preferring the telephone encounter, at this time.   Reason for Virtual Visit: COVID-19*  Social distancing based on CDC and AMA recommendations.   I contacted [name] on 08/25/2019 via video conference.      I clearly identified myself as Gillis Santa, MD. I verified that I was speaking with the correct person using two identifiers (Name: [name], and date of birth: 09-04-1946).  Advanced Informed Consent I sought verbal advanced consent from [name] for virtual visit interactions. I informed [name] of possible security and privacy concerns, risks, and limitations associated with providing "not-in-person" medical evaluation and management services. I also informed [name] of the availability of "in-person" appointments. Finally, I informed him that there would be a charge for the virtual visit and that he could be  personally, fully or partially, financially responsible for it. [name] expressed understanding and agreed to proceed.   Historic Elements   [name] is a 73 y.o. year old, male patient evaluated today after his last encounter by our practice on 08/24/2019. [name]  has a past medical history of Chronic back pain, COPD (chronic obstructive pulmonary disease) ([place]), Depression, Diabetes mellitus without complication ([place]),  Hyperlipidemia, Hypertension, Renal cell carcinoma ([place]), and Sleep apnea. He also  has a past surgical history that includes colonoscopys; Upper gi endoscopy; Flexible sigmoidoscopy; Gastric bypass; Nephrectomy; open reduction and internal fixation, right distal radius; Carpal tunnel release; right knee arthroscopy; Esophagogastroduodenoscopy (egd) with propofol (N/A, 07/29/2017); Colonoscopy with propofol (N/A, 07/29/2017); and Cholecystectomy. [name] has a current medication list which includes the following prescription(s): anoro ellipta, apixaban, atorvastatin, carvedilol, duloxetine, fluticasone, furosemide, gabapentin, glipizide, multivitamin, oxycodone-acetaminophen, oxycodone-acetaminophen, pantoprazole, paroxetine, pramipexole, sodium zirconium cyclosilicate, sucralfate, tizanidine, and tramadol. He  reports that he has quit smoking. He has a 30.00 pack-year smoking history. His smokeless tobacco use includes chew. He reports that he does not drink alcohol or use drugs. [name] has No Known Allergies.   HPI  Today, he is being contacted for medication management.   Patient has been participating with physical therapy over the last 4 weeks.  He has been going approximately 3 times a week.  He has not noticed any significant change in his pain or his strength.  He has also started utilizing Percocet 10 mg twice daily as needed.  He states that 1 tablet only provides him with minimal pain relief and on some occasions he has taken 2 tablets at once for a total of 20 mg which did provide him with moderate pain relief.  We discussed increasing his monthly quantity from 60-75 so that he can take 1 to 1.5 tablet twice daily as needed.  This should hopefully help manage his pain better.  Patient is tried and failed multiple conservative approaches including NSAID trials, membrane stabilizers, injection therapy including epidural steroid injection and now physical therapy.  Pharmacotherapy Assessment   Analgesic; Percocet 10 mg twice daily as needed, quantity 60/month.  Will increase to 75/month to  optimize pain control so that patient can utilize an extra tablet if need be. Monitoring: Pharmacotherapy: No side-effects or adverse reactions reported. [place] PMP: PDMP reviewed during this encounter.       Compliance: No problems identified. Effectiveness: Clinically acceptable. Plan: Refer to "POC".  UDS: No results found for: SUMMARY Laboratory Chemistry Profile (12 mo)  Renal: 07/15/2019: BUN 42; Creatinine, Ser 1.77  Lab Results  Component Value Date   GFRAA 44 (L) 07/15/2019   GFRNONAA 38 (L) 07/15/2019   Hepatic: 07/05/2019: Albumin 3.8 Lab Results  Component Value Date   AST 27 07/05/2019   ALT 35 07/05/2019   Other: No results found for requested labs within last 8760 hours. Note: Above Lab results reviewed.  Imaging  Last 90 days:  Dg Chest 2 View  Result Date: 07/07/2019 CLINICAL DATA:  Cough.  History of renal cell carcinoma. EXAM: CHEST - 2 VIEW COMPARISON:  January 24, 2019 FINDINGS: Stable mild cardiomegaly. The hila and mediastinum are unremarkable. No pneumothorax. No pulmonary nodules or masses. Minimal atelectasis in the left base. No focal infiltrates. IMPRESSION: No active cardiopulmonary disease. Electronically Signed   By: Dorise Bullion III M.D   On: 07/07/2019 15:18   US Renal  Result Date: 07/06/2019 CLINICAL DATA:  Acute renal failure. EXAM: RENAL / URINARY TRACT ULTRASOUND COMPLETE COMPARISON:  CT 01/27/2014 FINDINGS: Right Kidney: Renal measurements: 13.1 x 6.6 x 7.4 cm = volume: 334 mL . Echogenicity within normal limits. No mass or hydronephrosis visualized. 2.5 cm cyst over the upper pole. Left Kidney: Previous left nephrectomy. Bladder: Appears normal for degree of bladder distention. Right-sided ureteral jet visualized. IMPRESSION: Normal size right kidney without hydronephrosis. 2.5 cm cyst over the upper pole. Previous left nephrectomy. Electronically  Signed   By: Marin Olp M.D.   On: 07/06/2019 15:44   [facility] C-arm 1-60 Min No Report  Result Date: 06/13/2019 Fluoro was used, but no Radiologist interpretation will be provided. Please refer to "NOTES" tab for provider progress note.  Vas [place] Lower Extremity Venous Reflux  Result Date: 08/12/2019  Lower Venous Reflux Study Indications: Swelling, and left worse than right.  Risk Factors: Surgery Bilat laser ablations of GSV's. Performing Technologist: Concha Norway RVT  Examination Guidelines: A complete evaluation includes B-mode imaging, spectral Doppler, color Doppler, and power Doppler as needed of all accessible portions of each vessel. Bilateral testing is considered an integral part of a complete examination. Limited examinations for reoccurring indications may be performed as noted. The reflux portion of the exam is performed with the patient in reverse Trendelenburg.  +-----+---------------+---------+-----------+----------+--------------+ RIGHTCompressibilityPhasicitySpontaneityPropertiesThrombus Aging +-----+---------------+---------+-----------+----------+--------------+      Full           Yes      Yes                                 +-----+---------------+---------+-----------+----------+--------------+   +----+---------------+---------+-----------+----------+--------------+ LEFTCompressibilityPhasicitySpontaneityPropertiesThrombus Aging +----+---------------+---------+-----------+----------+--------------+     Full           Yes      Yes                                 +----+---------------+---------+-----------+----------+--------------+     Summary: Right: There is no evidence of deep vein thrombosis in the lower extremity.There is no evidence of superficial venous thrombosis.Successful vein closure.There is no evidence of chronic venous insufficiency. Left: There  is no evidence of deep vein thrombosis in the lower extremity. There is no evidence of  superficial venous thrombosis. Successful vein closure.There is no evidence of chronic venous insufficiency.  *See table(s) above for measurements and observations. Electronically signed by Leotis Pain MD on 08/12/2019 at 10:23:46 AM.    Final     Assessment  The primary encounter diagnosis was Lumbar radiculopathy. Diagnoses of Lumbar degenerative disc disease, Bilateral stenosis of lateral recess of lumbar spine (L3-L4), Ankylosis of lumbar spine, Neuroforaminal stenosis of lumbar spine (left, L5/S1), Spinal stenosis, lumbar region, with neurogenic claudication, Lumbar facet arthropathy, H/O deep venous thrombosis ([place]), Chronic obstructive pulmonary disease, unspecified COPD type ([place]), and Chronic pain syndrome were also pertinent to this visit.  Plan of Care  I have changed [name]'s oxyCODONE-acetaminophen. I am also having him start on oxyCODONE-acetaminophen. Additionally, I am having him maintain his apixaban, carvedilol, multivitamin, pantoprazole, PARoxetine, sucralfate, gabapentin, traMADol, pramipexole, Anoro Ellipta, atorvastatin, fluticasone, DULoxetine, furosemide, glipiZIDE, sodium zirconium cyclosilicate, and tiZANidine.  Pharmacotherapy (Medications Ordered): Meds ordered this encounter  Medications  . oxyCODONE-acetaminophen (PERCOCET) 10-325 MG tablet    Sig: Take 1-2 tablets by mouth 2 (two) times daily as needed for pain. Must last 30 days.    Dispense:  75 tablet    Refill:  0    Chronic Pain. (STOP Act - Not applicable). Fill one day early if closed on scheduled refill date.  Marland Kitchen oxyCODONE-acetaminophen (PERCOCET) 10-325 MG tablet    Sig: Take 1-2 tablets by mouth 2 (two) times daily as needed for pain. Must last 30 days.    Dispense:  75 tablet    Refill:  0    Chronic Pain. (STOP Act - Not applicable). Fill one day early if closed on scheduled refill date.    Follow-up plan:   Return in about 8 weeks (around 10/20/2019) for Medication Management,  virtual.    Recent Visits Date Type Provider Dept  07/28/19 Office Visit Gillis Santa, MD [facility]  06/30/19 Office Visit Gillis Santa, MD [facility]  06/08/19 Procedure visit Gillis Santa, MD [facility]  Showing recent visits within past 90 days and meeting all other requirements   Today's Visits Date Type Provider Dept  08/25/19 Office Visit Gillis Santa, MD [facility]  Showing today's visits and meeting all other requirements   Future Appointments No visits were found meeting these conditions.  Showing future appointments within next 90 days and meeting all other requirements   I discussed the assessment and treatment plan with the patient. The patient was provided an opportunity to ask questions and all were answered. The patient agreed with the plan and demonstrated an understanding of the instructions.  Patient advised to call back or seek an in-person evaluation if the symptoms or condition worsens.  Total duration of non-face-to-face encounter: 32minutes.  Note by: Gillis Santa, MD Date: 08/25/2019; Time: 1:40 PM  Note: This dictation was prepared with Dragon dictation. Any transcriptional errors that may result from this process are unintentional.  Disclaimer:  * Given the special circumstances of the COVID-19 pandemic, the federal government has announced that the Office for Civil Rights (OCR) will exercise its enforcement discretion and will not impose penalties on physicians using telehealth in the event of noncompliance with regulatory requirements under the [place] (HIPAA) in connection with the good faith provision of telehealth during the PFXTK-24 national public health emergency. ([place])

## 2019-09-01 ENCOUNTER — Encounter (INDEPENDENT_AMBULATORY_CARE_PROVIDER_SITE_OTHER): Payer: Self-pay | Admitting: Nurse Practitioner

## 2019-09-01 ENCOUNTER — Ambulatory Visit (INDEPENDENT_AMBULATORY_CARE_PROVIDER_SITE_OTHER): Payer: Medicare Other | Admitting: Nurse Practitioner

## 2019-09-01 ENCOUNTER — Other Ambulatory Visit: Payer: Self-pay

## 2019-09-01 VITALS — BP 125/68 | HR 77 | Resp 12 | Ht 72.0 in | Wt 271.0 lb

## 2019-09-01 DIAGNOSIS — M5136 Other intervertebral disc degeneration, lumbar region: Secondary | ICD-10-CM

## 2019-09-01 DIAGNOSIS — I890 Lymphedema, not elsewhere classified: Secondary | ICD-10-CM | POA: Diagnosis not present

## 2019-09-01 DIAGNOSIS — I10 Essential (primary) hypertension: Secondary | ICD-10-CM | POA: Diagnosis not present

## 2019-09-02 ENCOUNTER — Ambulatory Visit (INDEPENDENT_AMBULATORY_CARE_PROVIDER_SITE_OTHER): Payer: Medicare Other | Admitting: Vascular Surgery

## 2019-09-06 ENCOUNTER — Encounter (INDEPENDENT_AMBULATORY_CARE_PROVIDER_SITE_OTHER): Payer: Self-pay | Admitting: Nurse Practitioner

## 2019-09-06 NOTE — Progress Notes (Signed)
SUBJECTIVE:  Patient ID: [name], male    DOB: February 24, 1946, 73 y.o.   MRN: [id number] Chief Complaint  Patient presents with  . Follow-up    HPI  [name] is a 73 y.o. male that presents today for evaluation of lower extremity swelling following [place] wraps.  Today the patient swelling looks much better than when he was originally placed in [employer].  The patient removed his wraps the prior evening he denies any weeping throughout the day.  He states his legs feel better due to the decreased swelling.  He denies any fever, chills, nausea, vomiting or diarrhea.  He denies any chest pain or shortness of breath.  Past Medical History:  Diagnosis Date  . Chronic back pain   . COPD (chronic obstructive pulmonary disease) ([place])   . Depression   . Diabetes mellitus without complication ([place])   . Hyperlipidemia   . Hypertension   . Renal cell carcinoma ([place])   . Sleep apnea     Past Surgical History:  Procedure Laterality Date  . CARPAL TUNNEL RELEASE    . CHOLECYSTECTOMY    . COLONOSCOPY WITH PROPOFOL N/A 07/29/2017   Procedure: COLONOSCOPY WITH PROPOFOL;  Surgeon: Manya Silvas, MD;  Location: [facility] ENDOSCOPY;  Service: Endoscopy;  Laterality: N/A;  . colonoscopys    . ESOPHAGOGASTRODUODENOSCOPY (EGD) WITH PROPOFOL N/A 07/29/2017   Procedure: ESOPHAGOGASTRODUODENOSCOPY (EGD) WITH PROPOFOL;  Surgeon: Manya Silvas, MD;  Location: [facility] ENDOSCOPY;  Service: Endoscopy;  Laterality: N/A;  . FLEXIBLE SIGMOIDOSCOPY    . GASTRIC BYPASS    . NEPHRECTOMY    . open reduction and internal fixation, right distal radius    . right knee arthroscopy    . UPPER GI ENDOSCOPY      Social History   Socioeconomic History  . Marital status: Married    Spouse name: Jackelyn Poling  . Number of children: Not on file  . Years of education: Not on file  . Highest education level: Not on file  Occupational History  . Occupation: Retired    Comment: VP of [employer]  Social  Needs  . Financial resource strain: Not hard at all  . Food insecurity    Worry: Never true    Inability: Never true  . Transportation needs    Medical: No    Non-medical: No  Tobacco Use  . Smoking status: Former Smoker    Packs/day: 1.00    Years: 30.00    Pack years: 30.00  . Smokeless tobacco: Current User    Types: Chew  Substance and Sexual Activity  . Alcohol use: No    Frequency: Never  . Drug use: No  . Sexual activity: Yes  Lifestyle  . Physical activity    Days per week: 5 days    Minutes per session: 150+ min  . Stress: Not at all  Relationships  . Social connections    Talks on phone: More than three times a week    Gets together: More than three times a week    Attends religious service: More than 4 times per year    Active member of club or organization: No    Attends meetings of clubs or organizations: Not on file    Relationship status: Married  . Intimate partner violence    Fear of current or ex partner: No    Emotionally abused: No    Physically abused: No    Forced sexual activity: No  Other Topics Concern  .  Not on file  Social History Narrative  . Not on file    Family History  Problem Relation Age of Onset  . Diabetes Mother   . Cancer Mother   . Heart disease Father     No Known Allergies   Review of Systems   Review of Systems: Negative Unless Checked Constitutional: [] Weight loss  [] Fever  [] Chills Cardiac: [] Chest pain   []  Atrial Fibrillation  [] Palpitations   [] Shortness of breath when laying flat   [x] Shortness of breath with exertion. [] Shortness of breath at rest Vascular:  [] Pain in legs with walking   [] Pain in legs with standing [] Pain in legs when laying flat   [] Claudication    [] Pain in feet when laying flat    [] History of DVT   [] Phlebitis   [x] Swelling in legs   [x] Varicose veins   [] Non-healing ulcers Pulmonary:   [] Uses home oxygen   [] Productive cough   [] Hemoptysis   [] Wheeze  [x] COPD   [] Asthma Neurologic:   [] Dizziness   [] Seizures  [] Blackouts [] History of stroke   [] History of TIA  [] Aphasia   [] Temporary Blindness   [] Weakness or numbness in arm   [] Weakness or numbness in leg Musculoskeletal:   [x] Joint swelling   [] Joint pain   [x] Low back pain  []  History of Knee Replacement [] Arthritis [] back Surgeries  []  Spinal Stenosis    Hematologic:  [] Easy bruising  [] Easy bleeding   [] Hypercoagulable state   [] Anemic Gastrointestinal:  [] Diarrhea   [] Vomiting  [] Gastroesophageal reflux/heartburn   [] Difficulty swallowing. [] Abdominal pain Genitourinary:  [] Chronic kidney disease   [] Difficult urination  [] Anuric   [] Blood in urine [] Frequent urination  [] Burning with urination   [] Hematuria Skin:  [] Rashes   [] Ulcers [] Wounds Psychological:  [] History of anxiety   []  History of major depression  []  Memory Difficulties      OBJECTIVE:   Physical Exam  BP 125/68 (BP Location: Left Arm, Patient Position: Sitting, Cuff Size: Large)   Pulse 77   Resp 12   Ht 6' (1.829 m)   Wt 271 lb (122.9 kg)   BMI 36.75 kg/m   Gen: WD/WN, NAD Head: [place]/AT, No temporalis wasting.  Ear/Nose/Throat: Hearing grossly intact, nares w/o erythema or drainage Eyes: PER, EOMI, sclera nonicteric.  Neck: Supple, no masses.  No JVD.  Pulmonary:  Good air movement, no use of accessory muscles.  Cardiac: RRR Vascular:  2+ edema bilaterally, nonpitting Vessel Right Left  Radial Palpable Palpable  Dorsalis Pedis Palpable Palpable  Posterior Tibial Palpable Palpable   Gastrointestinal: soft, non-distended. No guarding/no peritoneal signs.  Musculoskeletal: M/S 5/5 throughout.  No deformity or atrophy.  Neurologic: Pain and light touch intact in extremities.  Symmetrical.  Speech is fluent. Motor exam as listed above. Psychiatric: Judgment intact, Mood & affect appropriate for pt's clinical situation. Dermatologic:  Bilateral stasis dermatitis. No Ulcers Noted.  No changes consistent with cellulitis. Lymph : No Cervical  lymphadenopathy, dermal thickening bilaterally       ASSESSMENT AND PLAN:  1. Lymphedema I had a long discussion with the patient regarding his lymphedema.  Previously the patient has admitted that he does not wear his compression socks on a regular basis, partially due to the weather as well as due to cosmetic reasons.  The patient also does not utilize his lymphedema pump on a regular basis.  Had a long discussion with the patient regarding treating lymphedema, and that a great deal of the treatment plan is based on these conservative therapies.  Without these conservative therapies the patient may find himself having frequent exacerbations.  Due to the change in the weather the patient will try to adhere to conservative therapy to gain better control of his edema, we will have the patient return in the office in 3 months in order to evaluate his progress with conservative therapy.  2. Essential hypertension Continue antihypertensive medications as already ordered, these medications have been reviewed and there are no changes at this time.   3. Lumbar degenerative disc disease Continue NSAID medications as already ordered, these medications have been reviewed and there are no changes at this time.  Continued activity and therapy was stressed.    Current Outpatient Medications on File Prior to Visit  Medication Sig Dispense Refill  . ANORO ELLIPTA 62.5-25 MCG/INH AEPB Inhale 1 puff into the lungs daily.     Marland Kitchen apixaban (ELIQUIS) 5 MG TABS tablet Take 5 mg by mouth every 12 (twelve) hours.     Marland Kitchen atorvastatin (LIPITOR) 10 MG tablet Take 10 mg by mouth daily.    . carvedilol (COREG) 6.25 MG tablet Take 6.25 mg by mouth 2 (two) times daily with a meal.    . DULoxetine (CYMBALTA) 30 MG capsule Take 30 mg by mouth daily.    . DULoxetine (CYMBALTA) 60 MG capsule     . fluticasone (FLONASE) 50 MCG/ACT nasal spray Place 1 spray into both nostrils 2 (two) times a day.    . furosemide (LASIX) 40 MG  tablet Take 1 tablet (40 mg total) by mouth daily. 30 tablet 0  . gabapentin (NEURONTIN) 300 MG capsule Take 300 mg by mouth 2 (two) times daily.     Marland Kitchen glipiZIDE (GLUCOTROL XL) 2.5 MG 24 hr tablet Take 1 tablet (2.5 mg total) by mouth daily with breakfast. 30 tablet 0  . Multiple Vitamin (MULTIVITAMIN) tablet Take 1 tablet by mouth daily.    Marland Kitchen oxyCODONE-acetaminophen (PERCOCET) 10-325 MG tablet Take 1-2 tablets by mouth 2 (two) times daily as needed for pain. Must last 30 days. 75 tablet 0  . [START ON 09/25/2019] oxyCODONE-acetaminophen (PERCOCET) 10-325 MG tablet Take 1-2 tablets by mouth 2 (two) times daily as needed for pain. Must last 30 days. 75 tablet 0  . pantoprazole (PROTONIX) 40 MG tablet Take 40 mg by mouth 2 (two) times daily.     Marland Kitchen PARoxetine (PAXIL) 40 MG tablet Take 40 mg by mouth every morning.     . pramipexole (MIRAPEX) 1 MG tablet Take 1 mg by mouth 2 (two) times a day.     . sodium zirconium cyclosilicate (LOKELMA) 10 g PACK packet Take 10 g by mouth 2 (two) times daily. 60 packet 0  . sucralfate (CARAFATE) 1 g tablet Take 1 g by mouth 3 (three) times daily.     Marland Kitchen tiZANidine (ZANAFLEX) 4 MG tablet Take 4 mg by mouth every 8 (eight) hours as needed for muscle spasms.    . traMADol (ULTRAM) 50 MG tablet Take 50 mg by mouth every 6 (six) hours as needed.     No current facility-administered medications on file prior to visit.     There are no Patient Instructions on file for this visit. No follow-ups on file.   Kris Hartmann, NP  This note was completed with Sales executive.  Any errors are purely unintentional.

## 2019-09-19 ENCOUNTER — Other Ambulatory Visit: Payer: Self-pay

## 2019-09-19 ENCOUNTER — Inpatient Hospital Stay: Payer: Medicare Other

## 2019-09-19 ENCOUNTER — Inpatient Hospital Stay: Payer: Medicare Other | Attending: Oncology

## 2019-09-19 VITALS — BP 124/69 | HR 72

## 2019-09-19 DIAGNOSIS — N189 Chronic kidney disease, unspecified: Secondary | ICD-10-CM | POA: Insufficient documentation

## 2019-09-19 DIAGNOSIS — Z79899 Other long term (current) drug therapy: Secondary | ICD-10-CM | POA: Diagnosis not present

## 2019-09-19 DIAGNOSIS — D631 Anemia in chronic kidney disease: Secondary | ICD-10-CM | POA: Insufficient documentation

## 2019-09-19 DIAGNOSIS — D649 Anemia, unspecified: Secondary | ICD-10-CM

## 2019-09-19 DIAGNOSIS — D509 Iron deficiency anemia, unspecified: Secondary | ICD-10-CM

## 2019-09-19 LAB — IRON AND TIBC
Iron: 39 ug/dL — ABNORMAL LOW (ref 45–182)
Saturation Ratios: 10 % — ABNORMAL LOW (ref 17.9–39.5)
TIBC: 380 ug/dL (ref 250–450)
UIBC: 341 ug/dL

## 2019-09-19 LAB — CBC WITH DIFFERENTIAL/PLATELET
Abs Immature Granulocytes: 0.02 10*3/uL (ref 0.00–0.07)
Basophils Absolute: 0 10*3/uL (ref 0.0–0.1)
Basophils Relative: 1 %
Eosinophils Absolute: 0.2 10*3/uL (ref 0.0–0.5)
Eosinophils Relative: 3 %
HCT: 30.9 % — ABNORMAL LOW (ref 39.0–52.0)
Hemoglobin: 9.4 g/dL — ABNORMAL LOW (ref 13.0–17.0)
Immature Granulocytes: 0 %
Lymphocytes Relative: 19 %
Lymphs Abs: 1.1 10*3/uL (ref 0.7–4.0)
MCH: 26.8 pg (ref 26.0–34.0)
MCHC: 30.4 g/dL (ref 30.0–36.0)
MCV: 88 fL (ref 80.0–100.0)
Monocytes Absolute: 0.4 10*3/uL (ref 0.1–1.0)
Monocytes Relative: 7 %
Neutro Abs: 3.9 10*3/uL (ref 1.7–7.7)
Neutrophils Relative %: 70 %
Platelets: 183 10*3/uL (ref 150–400)
RBC: 3.51 MIL/uL — ABNORMAL LOW (ref 4.22–5.81)
RDW: 15.8 % — ABNORMAL HIGH (ref 11.5–15.5)
WBC: 5.6 10*3/uL (ref 4.0–10.5)
nRBC: 0 % (ref 0.0–0.2)

## 2019-09-19 LAB — FERRITIN: Ferritin: 10 ng/mL — ABNORMAL LOW (ref 24–336)

## 2019-09-19 MED ORDER — EPOETIN ALFA 40000 UNIT/ML IJ SOLN
40000.0000 [IU] | Freq: Once | INTRAMUSCULAR | Status: AC
  Administered 2019-09-19: 40000 [IU] via SUBCUTANEOUS
  Filled 2019-09-19: qty 1

## 2019-09-20 DIAGNOSIS — R809 Proteinuria, unspecified: Secondary | ICD-10-CM | POA: Insufficient documentation

## 2019-09-27 ENCOUNTER — Telehealth: Payer: Self-pay | Admitting: Student in an Organized Health Care Education/Training Program

## 2019-09-27 NOTE — Telephone Encounter (Signed)
Rx was sent in September to be filled on 09/25/19.  Patient needs to be called to let him know this.

## 2019-09-27 NOTE — Telephone Encounter (Signed)
Patient notified there is a script at pharmacy ready to fill.

## 2019-09-27 NOTE — Telephone Encounter (Signed)
Calling to check on a script for Oxycodone? Please call and let patient know status. Has MM appt on 10-11-19

## 2019-10-10 ENCOUNTER — Encounter: Payer: Self-pay | Admitting: Student in an Organized Health Care Education/Training Program

## 2019-10-11 ENCOUNTER — Telehealth: Payer: Self-pay | Admitting: *Deleted

## 2019-10-11 ENCOUNTER — Ambulatory Visit
Payer: Medicare Other | Attending: Student in an Organized Health Care Education/Training Program | Admitting: Student in an Organized Health Care Education/Training Program

## 2019-10-11 ENCOUNTER — Other Ambulatory Visit: Payer: Self-pay

## 2019-10-11 ENCOUNTER — Encounter: Payer: Self-pay | Admitting: Student in an Organized Health Care Education/Training Program

## 2019-10-11 DIAGNOSIS — G894 Chronic pain syndrome: Secondary | ICD-10-CM

## 2019-10-11 DIAGNOSIS — J449 Chronic obstructive pulmonary disease, unspecified: Secondary | ICD-10-CM

## 2019-10-11 DIAGNOSIS — M5136 Other intervertebral disc degeneration, lumbar region: Secondary | ICD-10-CM | POA: Diagnosis not present

## 2019-10-11 DIAGNOSIS — M47816 Spondylosis without myelopathy or radiculopathy, lumbar region: Secondary | ICD-10-CM

## 2019-10-11 DIAGNOSIS — M48061 Spinal stenosis, lumbar region without neurogenic claudication: Secondary | ICD-10-CM

## 2019-10-11 DIAGNOSIS — M51369 Other intervertebral disc degeneration, lumbar region without mention of lumbar back pain or lower extremity pain: Secondary | ICD-10-CM

## 2019-10-11 DIAGNOSIS — M5416 Radiculopathy, lumbar region: Secondary | ICD-10-CM

## 2019-10-11 DIAGNOSIS — M4326 Fusion of spine, lumbar region: Secondary | ICD-10-CM

## 2019-10-11 DIAGNOSIS — M9973 Connective tissue and disc stenosis of intervertebral foramina of lumbar region: Secondary | ICD-10-CM

## 2019-10-11 DIAGNOSIS — Z86718 Personal history of other venous thrombosis and embolism: Secondary | ICD-10-CM

## 2019-10-11 DIAGNOSIS — M48062 Spinal stenosis, lumbar region with neurogenic claudication: Secondary | ICD-10-CM

## 2019-10-11 MED ORDER — OXYCODONE-ACETAMINOPHEN 10-325 MG PO TABS: 1.0000 | ORAL_TABLET | Freq: Two times a day (BID) | ORAL | 0 refills | Status: DC | PRN

## 2019-10-11 NOTE — Progress Notes (Signed)
Pain Management Virtual Encounter Note - Virtual Visit via [place] (real-time audio visits between healthcare provider and patient).   Patient's Phone No. & Preferred Pharmacy:  [phone] (home); [phone] (mobile); (Preferred) [phone] No e-mail address on record  [id number] Lorina Rabon, [place] [address] [place] [address] Phone: [phone] Fax: [phone]    Pre-screening note:  Our staff contacted Mr. Kelsay and offered him an "in person", "face-to-face" appointment versus a telephone encounter. He indicated preferring the telephone encounter, at this time.   Reason for Virtual Visit: COVID-19*  Social distancing based on CDC and AMA recommendations.   I contacted Luisdaniel Kenton on 10/11/2019 via video conference.      I clearly identified myself as Gillis Santa, MD. I verified that I was speaking with the correct person using two identifiers (Name: Man Effertz, and date of birth: 26-Aug-1946).  Advanced Informed Consent I sought verbal advanced consent from Venetia Constable for virtual visit interactions. I informed Mr. Meegan of possible security and privacy concerns, risks, and limitations associated with providing "not-in-person" medical evaluation and management services. I also informed Mr. Gibbard of the availability of "in-person" appointments. Finally, I informed him that there would be a charge for the virtual visit and that he could be  personally, fully or partially, financially responsible for it. Mr. Degroat expressed understanding and agreed to proceed.   Historic Elements   Mr. Lazarius Rivkin is a 73 y.o. year old, male patient evaluated today after his last encounter by our practice on 09/27/2019. Mr. Kranz  has a past medical history of Chronic back pain, COPD (chronic obstructive pulmonary disease) ([place]), Depression, Diabetes mellitus without complication ([place]),  Hyperlipidemia, Hypertension, Renal cell carcinoma ([place]), and Sleep apnea. He also  has a past surgical history that includes colonoscopys; Upper gi endoscopy; Flexible sigmoidoscopy; Gastric bypass; Nephrectomy; open reduction and internal fixation, right distal radius; Carpal tunnel release; right knee arthroscopy; Esophagogastroduodenoscopy (egd) with propofol (N/A, 07/29/2017); Colonoscopy with propofol (N/A, 07/29/2017); and Cholecystectomy. Mr. Deterding has a current medication list which includes the following prescription(s): apixaban, atorvastatin, carvedilol, duloxetine, furosemide, glipizide, oxycodone-acetaminophen, oxycodone-acetaminophen, oxycodone-acetaminophen, pantoprazole, pramipexole, sucralfate, anoro ellipta, duloxetine, fluticasone, gabapentin, multivitamin, paroxetine, sodium zirconium cyclosilicate, tizanidine, and tramadol. He  reports that he has quit smoking. He has a 30.00 pack-year smoking history. His smokeless tobacco use includes chew. He reports that he does not drink alcohol or use drugs. Mr. Regnier has No Known Allergies.   HPI  Today, he is being contacted for medication management.  Is endorsing benefit with this current Percocet 10 mg BID- TID prn, is allowing him to function better and work in the yard. Is still going to [facility] 2x/ week with 45-60 min sessions Is noticing benefit with PT and feels that he has gotten stronger in his legs  Pharmacotherapy Assessment  Analgesic:  09/29/2019  1   08/25/2019  Oxycodone-Acetaminophen 10-325  75.00  30 Bi Lat   [id number]   Nor (2541)   0  37.50 MME  Medicare   [place]    Monitoring: Pharmacotherapy: No side-effects or adverse reactions reported. [place] PMP: PDMP reviewed during this encounter.       Compliance: No problems identified. Effectiveness: Clinically acceptable. Plan: Refer to "POC".  Laboratory Chemistry Profile (12 mo)  Renal: 07/15/2019: BUN 42; Creatinine, Ser 1.77  Lab Results  Component Value Date    GFRAA 44 (L) 07/15/2019   GFRNONAA 38 (L) 07/15/2019   Hepatic: 07/05/2019: Albumin  3.8 Lab Results  Component Value Date   AST 27 07/05/2019   ALT 35 07/05/2019   Other: No results found for requested labs within last 8760 hours. Note: Above Lab results reviewed.  Imaging  Last 90 days:  Vas [place] Lower Extremity Venous Reflux  Result Date: 08/12/2019  Lower Venous Reflux Study Indications: Swelling, and left worse than right.  Risk Factors: Surgery Bilat laser ablations of GSV's. Performing Technologist: Concha [place] RVT  Examination Guidelines: A complete evaluation includes B-mode imaging, spectral Doppler, color Doppler, and power Doppler as needed of all accessible portions of each vessel. Bilateral testing is considered an integral part of a complete examination. Limited examinations for reoccurring indications may be performed as noted. The reflux portion of the exam is performed with the patient in reverse Trendelenburg.  +-----+---------------+---------+-----------+----------+--------------+ RIGHTCompressibilityPhasicitySpontaneityPropertiesThrombus Aging +-----+---------------+---------+-----------+----------+--------------+      Full           Yes      Yes                                 +-----+---------------+---------+-----------+----------+--------------+   +----+---------------+---------+-----------+----------+--------------+ LEFTCompressibilityPhasicitySpontaneityPropertiesThrombus Aging +----+---------------+---------+-----------+----------+--------------+     Full           Yes      Yes                                 +----+---------------+---------+-----------+----------+--------------+     Summary: Right: There is no evidence of deep vein thrombosis in the lower extremity.There is no evidence of superficial venous thrombosis.Successful vein closure.There is no evidence of chronic venous insufficiency. Left: There is no evidence of deep vein thrombosis in  the lower extremity. There is no evidence of superficial venous thrombosis. Successful vein closure.There is no evidence of chronic venous insufficiency.  *See table(s) above for measurements and observations. Electronically signed by Leotis Pain MD on 08/12/2019 at 10:23:46 AM.    Final     Assessment  The primary encounter diagnosis was Lumbar radiculopathy. Diagnoses of Lumbar degenerative disc disease, Bilateral stenosis of lateral recess of lumbar spine (L3-L4), Ankylosis of lumbar spine, Neuroforaminal stenosis of lumbar spine (left, L5/S1), Spinal stenosis, lumbar region, with neurogenic claudication, Lumbar facet arthropathy, H/O deep venous thrombosis (ON ELIQUIS), Chronic obstructive pulmonary disease, unspecified COPD type ([place]), and Chronic pain syndrome were also pertinent to this visit.  Plan of Care  I am having Wellington S. Megan Salon start on oxyCODONE-acetaminophen and oxyCODONE-acetaminophen. I am also having him maintain his apixaban, carvedilol, multivitamin, pantoprazole, PARoxetine, sucralfate, gabapentin, traMADol, pramipexole, Anoro Ellipta, atorvastatin, fluticasone, DULoxetine, furosemide, glipiZIDE, sodium zirconium cyclosilicate, tiZANidine, DULoxetine, and oxyCODONE-acetaminophen.  Overall, patient is endorsing benefit with his medication regimen of oxycodone 1 to 2 tablets twice daily as needed.  Patient usually takes 2 tablets in the morning and then if he needs to he will take an additional tablet at night.  Analgesics managing his pain better but he states that he is able to ambulate for a longer period of time without as much pain.  I am very pleased that he is continue to work with physical therapy as this has improved his functional status.  I encouraged the patient to continue his multimodal analgesics including gabapentin and tizanidine and Cymbalta as above.  Will provide refill of oxycodone as below.  Follow-up in 3 months.  Pharmacotherapy (Medications Ordered): Meds  ordered this encounter  Medications  .  oxyCODONE-acetaminophen (PERCOCET) 10-325 MG tablet    Sig: Take 1-2 tablets by mouth 2 (two) times daily as needed for pain. Must last 30 days.    Dispense:  75 tablet    Refill:  0    Chronic Pain. (STOP Act - Not applicable). Fill one day early if closed on scheduled refill date.  Marland Kitchen oxyCODONE-acetaminophen (PERCOCET) 10-325 MG tablet    Sig: Take 1-2 tablets by mouth 2 (two) times daily as needed for pain. Must last 30 days.    Dispense:  75 tablet    Refill:  0    Chronic Pain. (STOP Act - Not applicable). Fill one day early if closed on scheduled refill date.  Marland Kitchen oxyCODONE-acetaminophen (PERCOCET) 10-325 MG tablet    Sig: Take 1-2 tablets by mouth 2 (two) times daily as needed for pain. Must last 30 days.    Dispense:  75 tablet    Refill:  0    Chronic Pain. (STOP Act - Not applicable). Fill one day early if closed on scheduled refill date.   Follow-up plan:   Return in about 3 months (around 01/11/2020) for Medication Management, in person.    Recent Visits Date Type Provider Dept  08/25/19 Office Visit Gillis Santa, MD [facility]  07/28/19 Office Visit Gillis Santa, MD [facility]  Showing recent visits within past 90 days and meeting all other requirements   Today's Visits Date Type Provider Dept  10/11/19 Office Visit Gillis Santa, MD [facility]  Showing today's visits and meeting all other requirements   Future Appointments No visits were found meeting these conditions.  Showing future appointments within next 90 days and meeting all other requirements   I discussed the assessment and treatment plan with the patient. The patient was provided an opportunity to ask questions and all were answered. The patient agreed with the plan and demonstrated an understanding of the instructions.  Patient advised to call back or seek an in-person evaluation if the symptoms or condition worsens.  Total duration  of non-face-to-face encounter: 25 minutes.  Note by: Gillis Santa, MD Date: 10/11/2019; Time: 10:36 AM  Note: This dictation was prepared with Dragon dictation. Any transcriptional errors that may result from this process are unintentional.  Disclaimer:  * Given the special circumstances of the COVID-19 pandemic, the federal government has announced that the Office for Civil Rights (OCR) will exercise its enforcement discretion and will not impose penalties on physicians using telehealth in the event of noncompliance with regulatory requirements under the [place] (HIPAA) in connection with the good faith provision of telehealth during the JFHLK-56 national public health emergency. ([place])

## 2019-10-17 ENCOUNTER — Inpatient Hospital Stay: Payer: Medicare Other

## 2019-10-17 ENCOUNTER — Inpatient Hospital Stay: Payer: Medicare Other | Attending: Oncology

## 2019-10-17 ENCOUNTER — Other Ambulatory Visit: Payer: Self-pay

## 2019-10-17 VITALS — BP 106/63 | HR 75 | Temp 99.3°F | Resp 18

## 2019-10-17 DIAGNOSIS — D631 Anemia in chronic kidney disease: Secondary | ICD-10-CM | POA: Insufficient documentation

## 2019-10-17 DIAGNOSIS — D649 Anemia, unspecified: Secondary | ICD-10-CM

## 2019-10-17 DIAGNOSIS — Z79899 Other long term (current) drug therapy: Secondary | ICD-10-CM | POA: Diagnosis not present

## 2019-10-17 DIAGNOSIS — N189 Chronic kidney disease, unspecified: Secondary | ICD-10-CM | POA: Diagnosis present

## 2019-10-17 DIAGNOSIS — D509 Iron deficiency anemia, unspecified: Secondary | ICD-10-CM

## 2019-10-17 LAB — CBC WITH DIFFERENTIAL/PLATELET
Abs Immature Granulocytes: 0.04 10*3/uL (ref 0.00–0.07)
Basophils Absolute: 0 10*3/uL (ref 0.0–0.1)
Basophils Relative: 0 %
Eosinophils Absolute: 0.1 10*3/uL (ref 0.0–0.5)
Eosinophils Relative: 0 %
HCT: 33 % — ABNORMAL LOW (ref 39.0–52.0)
Hemoglobin: 10.3 g/dL — ABNORMAL LOW (ref 13.0–17.0)
Immature Granulocytes: 0 %
Lymphocytes Relative: 5 %
Lymphs Abs: 0.7 10*3/uL (ref 0.7–4.0)
MCH: 25.7 pg — ABNORMAL LOW (ref 26.0–34.0)
MCHC: 31.2 g/dL (ref 30.0–36.0)
MCV: 82.3 fL (ref 80.0–100.0)
Monocytes Absolute: 0.5 10*3/uL (ref 0.1–1.0)
Monocytes Relative: 4 %
Neutro Abs: 12 10*3/uL — ABNORMAL HIGH (ref 1.7–7.7)
Neutrophils Relative %: 91 %
Platelets: 201 10*3/uL (ref 150–400)
RBC: 4.01 MIL/uL — ABNORMAL LOW (ref 4.22–5.81)
RDW: 16.6 % — ABNORMAL HIGH (ref 11.5–15.5)
WBC: 13.2 10*3/uL — ABNORMAL HIGH (ref 4.0–10.5)
nRBC: 0 % (ref 0.0–0.2)

## 2019-10-17 LAB — IRON AND TIBC
Iron: 33 ug/dL — ABNORMAL LOW (ref 45–182)
Saturation Ratios: 9 % — ABNORMAL LOW (ref 17.9–39.5)
TIBC: 379 ug/dL (ref 250–450)
UIBC: 346 ug/dL

## 2019-10-17 LAB — FERRITIN: Ferritin: 16 ng/mL — ABNORMAL LOW (ref 24–336)

## 2019-10-17 MED ORDER — EPOETIN ALFA-EPBX 40000 UNIT/ML IJ SOLN
40000.0000 [IU] | Freq: Once | INTRAMUSCULAR | Status: AC
  Administered 2019-10-17: 40000 [IU] via SUBCUTANEOUS
  Filled 2019-10-17: qty 1

## 2019-10-21 ENCOUNTER — Telehealth (INDEPENDENT_AMBULATORY_CARE_PROVIDER_SITE_OTHER): Payer: Self-pay | Admitting: Vascular Surgery

## 2019-10-21 NOTE — Telephone Encounter (Signed)
PATIENT SCHEDULED  °

## 2019-10-24 ENCOUNTER — Ambulatory Visit (INDEPENDENT_AMBULATORY_CARE_PROVIDER_SITE_OTHER): Payer: Medicare Other | Admitting: Nurse Practitioner

## 2019-10-24 ENCOUNTER — Encounter (INDEPENDENT_AMBULATORY_CARE_PROVIDER_SITE_OTHER): Payer: Self-pay | Admitting: Nurse Practitioner

## 2019-10-24 ENCOUNTER — Other Ambulatory Visit: Payer: Self-pay

## 2019-10-24 VITALS — BP 121/63 | HR 66 | Resp 15 | Wt 269.0 lb

## 2019-10-24 DIAGNOSIS — L03116 Cellulitis of left lower limb: Secondary | ICD-10-CM

## 2019-10-24 DIAGNOSIS — I890 Lymphedema, not elsewhere classified: Secondary | ICD-10-CM

## 2019-10-24 DIAGNOSIS — J449 Chronic obstructive pulmonary disease, unspecified: Secondary | ICD-10-CM | POA: Diagnosis not present

## 2019-10-24 DIAGNOSIS — I10 Essential (primary) hypertension: Secondary | ICD-10-CM

## 2019-10-24 MED ORDER — DOXYCYCLINE HYCLATE 100 MG PO CAPS: 100.0000 mg | ORAL_CAPSULE | Freq: Every day | ORAL | 0 refills | Status: DC

## 2019-10-24 NOTE — Progress Notes (Signed)
SUBJECTIVE:  Patient ID: [name], male    DOB: 1946-04-22, 73 y.o.   MRN: [id number] Chief Complaint  Patient presents with  . Follow-up    leg swelling and redness    HPI  [name] is a 73 y.o. male that presents today after contacting our office on Friday afternoon with complaints of having severe swelling, redness as well as blister and burn dimensions.  Today, the patient states that the swelling is improved over the weekend however he does note that the redness is still persistent and it is sore to the touch.  There are no blisters present.  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  He denies any fever, chills, nausea, vomiting or diarrhea.  The patient does know he has been wearing his medical grade 1 compression stockings however he is unclear with if he has been consistent.  The patient also endorses utilizing his lymphedema pump for an hour twice a day.  Past Medical History:  Diagnosis Date  . Chronic back pain   . COPD (chronic obstructive pulmonary disease) ([place])   . Depression   . Diabetes mellitus without complication ([place])   . Hyperlipidemia   . Hypertension   . Renal cell carcinoma ([place])   . Sleep apnea     Past Surgical History:  Procedure Laterality Date  . CARPAL TUNNEL RELEASE    . CHOLECYSTECTOMY    . COLONOSCOPY WITH PROPOFOL N/A 07/29/2017   Procedure: COLONOSCOPY WITH PROPOFOL;  Surgeon: Manya Silvas, MD;  Location: [facility] ENDOSCOPY;  Service: Endoscopy;  Laterality: N/A;  . colonoscopys    . ESOPHAGOGASTRODUODENOSCOPY (EGD) WITH PROPOFOL N/A 07/29/2017   Procedure: ESOPHAGOGASTRODUODENOSCOPY (EGD) WITH PROPOFOL;  Surgeon: Manya Silvas, MD;  Location: [facility] ENDOSCOPY;  Service: Endoscopy;  Laterality: N/A;  . FLEXIBLE SIGMOIDOSCOPY    . GASTRIC BYPASS    . NEPHRECTOMY    . open reduction and internal fixation, right distal radius    . right knee  arthroscopy    . UPPER GI ENDOSCOPY      Social History   Socioeconomic History  . Marital status: Married    Spouse name: Jackelyn Poling  . Number of children: Not on file  . Years of education: Not on file  . Highest education level: Not on file  Occupational History  . Occupation: Retired    Comment: VP of [employer]  Social Needs  . Financial resource strain: Not hard at all  . Food insecurity    Worry: Never true    Inability: Never true  . Transportation needs    Medical: No    Non-medical: No  Tobacco Use  . Smoking status: Former Smoker    Packs/day: 1.00    Years: 30.00    Pack years: 30.00  . Smokeless tobacco: Current User    Types: Chew  Substance and Sexual Activity  . Alcohol use: No    Frequency: Never  . Drug use: No  . Sexual activity: Yes  Lifestyle  . Physical activity    Days per week: 5 days    Minutes per session: 150+ min  . Stress: Not at all  Relationships  . Social connections    Talks on phone: More than three times a week    Gets together: More than three times a week    Attends religious service: More than 4 times per year    Active member of  club or organization: No    Attends meetings of clubs or organizations: Not on file    Relationship status: Married  . Intimate partner violence    Fear of current or ex partner: No    Emotionally abused: No    Physically abused: No    Forced sexual activity: No  Other Topics Concern  . Not on file  Social History Narrative  . Not on file    Family History  Problem Relation Age of Onset  . Diabetes Mother   . Cancer Mother   . Heart disease Father     No Known Allergies   Review of Systems   Review of Systems: Negative Unless Checked Constitutional: [] Weight loss  [] Fever  [] Chills Cardiac: [] Chest pain   []  Atrial Fibrillation  [] Palpitations   [] Shortness of breath when laying flat   [] Shortness of breath with exertion. [] Shortness of breath at rest Vascular:  [] Pain in legs with  walking   [] Pain in legs with standing [] Pain in legs when laying flat   [] Claudication    [] Pain in feet when laying flat    [] History of DVT   [] Phlebitis   [x] Swelling in legs   [x] Varicose veins   [] Non-healing ulcers Pulmonary:   [] Uses home oxygen   [] Productive cough   [] Hemoptysis   [] Wheeze  [] COPD   [] Asthma Neurologic:  [] Dizziness   [] Seizures  [] Blackouts [] History of stroke   [] History of TIA  [] Aphasia   [] Temporary Blindness   [] Weakness or numbness in arm   [] Weakness or numbness in leg Musculoskeletal:   [] Joint swelling   [] Joint pain   [x] Low back pain  []  History of Knee Replacement [] Arthritis [] back Surgeries  []  Spinal Stenosis    Hematologic:  [] Easy bruising  [] Easy bleeding   [] Hypercoagulable state   [] Anemic Gastrointestinal:  [] Diarrhea   [] Vomiting  [] Gastroesophageal reflux/heartburn   [] Difficulty swallowing. [] Abdominal pain Genitourinary:  [] Chronic kidney disease   [] Difficult urination  [] Anuric   [] Blood in urine [] Frequent urination  [] Burning with urination   [] Hematuria Skin:  [] Rashes   [] Ulcers [x] Wounds Psychological:  [x] History of anxiety   []  History of major depression  []  Memory Difficulties      OBJECTIVE:   Physical Exam  BP 121/63 (BP Location: Right Arm)   Pulse 66   Resp 15   Wt 269 lb (122 kg)   BMI 36.48 kg/m   Gen: WD/WN, NAD Head: [place]/AT, No temporalis wasting.  Ear/Nose/Throat: Hearing grossly intact, nares w/o erythema or drainage Eyes: PER, EOMI, sclera nonicteric.  Neck: Supple, no masses.  No JVD.  Pulmonary:  Good air movement, no use of accessory muscles.  Cardiac: RRR Vascular:  1-2+ edema on the right, 3+ on left Vessel Right Left  Radial Palpable Palpable  Dorsalis Pedis Palpable Palpable  Posterior Tibial Palpable Palpable   Gastrointestinal: soft, non-distended. No guarding/no peritoneal signs.  Musculoskeletal: M/S 5/5 throughout.  No deformity or atrophy.  Neurologic: Pain and light touch intact in  extremities.  Symmetrical.  Speech is fluent. Motor exam as listed above. Psychiatric: Judgment intact, Mood & affect appropriate for pt's clinical situation. Dermatologic: No Venous rashes. No Ulcers Noted.    Changes consistent with cellulitis on left lower extremity. Lymph : No Cervical lymphadenopathy, no lichenification or skin changes of chronic lymphedema.           ASSESSMENT AND PLAN:  1. Cellulitis of left lower extremity We will place the patient in [place] wraps in the left  lower extremity.  We will also treat the patient for cellulitis as he appears to have some early cellulitic changes.  The patient will return to the office on a weekly basis to have wraps changed.  We will reevaluate his lower extremity edema in 4 weeks. - doxycycline (VIBRAMYCIN) 100 MG capsule; Take 1 capsule (100 mg total) by mouth daily.  Dispense: 7 capsule; Refill: 0  2. Lymphedema Patient will continue to utilize conservative therapy for his lymphedema such as wearing compression socks on his right lower extremity.  He will also try to get approximately 30 minutes of exercise a day.  Due to the redness and irritation I have advised that for the next couple of days he should stop using his lymph pump and begin at 30 minutes a day and work his way up to an hour.  3. Essential hypertension Continue antihypertensive medications as already ordered, these medications have been reviewed and there are no changes at this time.   4. Chronic obstructive pulmonary disease, unspecified COPD type ([place]) Continue pulmonary medications and aerosols as already ordered, these medications have been reviewed and there are no changes at this time.     Current Outpatient Medications on File Prior to Visit  Medication Sig Dispense Refill  . ANORO ELLIPTA 62.5-25 MCG/INH AEPB Inhale 1 puff into the lungs daily.     Marland Kitchen apixaban (ELIQUIS) 5 MG TABS tablet Take 5 mg by mouth every 12 (twelve) hours.     Marland Kitchen atorvastatin  (LIPITOR) 10 MG tablet Take 10 mg by mouth daily.    . carvedilol (COREG) 6.25 MG tablet Take 6.25 mg by mouth 2 (two) times daily with a meal.    . DULoxetine (CYMBALTA) 30 MG capsule Take 30 mg by mouth daily.    . DULoxetine (CYMBALTA) 60 MG capsule 60 mg daily.     . fluticasone (FLONASE) 50 MCG/ACT nasal spray Place 1 spray into both nostrils 2 (two) times a day.    . furosemide (LASIX) 40 MG tablet Take 1 tablet (40 mg total) by mouth daily. (Patient taking differently: Take 40 mg by mouth 2 (two) times daily. ) 30 tablet 0  . gabapentin (NEURONTIN) 300 MG capsule Take 300 mg by mouth 2 (two) times daily.     Marland Kitchen glipiZIDE (GLUCOTROL XL) 2.5 MG 24 hr tablet Take 1 tablet (2.5 mg total) by mouth daily with breakfast. 30 tablet 0  . Multiple Vitamin (MULTIVITAMIN) tablet Take 1 tablet by mouth daily.    Derrill Memo ON 10/29/2019] oxyCODONE-acetaminophen (PERCOCET) 10-325 MG tablet Take 1-2 tablets by mouth 2 (two) times daily as needed for pain. Must last 30 days. 75 tablet 0  . [START ON 11/28/2019] oxyCODONE-acetaminophen (PERCOCET) 10-325 MG tablet Take 1-2 tablets by mouth 2 (two) times daily as needed for pain. Must last 30 days. 75 tablet 0  . [START ON 12/28/2019] oxyCODONE-acetaminophen (PERCOCET) 10-325 MG tablet Take 1-2 tablets by mouth 2 (two) times daily as needed for pain. Must last 30 days. 75 tablet 0  . pantoprazole (PROTONIX) 40 MG tablet Take 40 mg by mouth 2 (two) times daily.     Marland Kitchen PARoxetine (PAXIL) 40 MG tablet Take 40 mg by mouth every morning.     . pramipexole (MIRAPEX) 1 MG tablet Take 1 mg by mouth 2 (two) times a day.     . sucralfate (CARAFATE) 1 g tablet Take 1 g by mouth 3 (three) times daily.     Marland Kitchen tiZANidine (ZANAFLEX) 4 MG tablet  Take 4 mg by mouth every 8 (eight) hours as needed for muscle spasms.    . traMADol (ULTRAM) 50 MG tablet Take 50 mg by mouth every 6 (six) hours as needed.    . sodium zirconium cyclosilicate (LOKELMA) 10 g PACK packet Take 10 g by mouth 2  (two) times daily. (Patient not taking: Reported on 10/10/2019) 60 packet 0   No current facility-administered medications on file prior to visit.     There are no Patient Instructions on file for this visit. No follow-ups on file.   Kris Hartmann, NP  This note was completed with Sales executive.  Any errors are purely unintentional.

## 2019-10-28 ENCOUNTER — Other Ambulatory Visit: Payer: Self-pay

## 2019-10-28 ENCOUNTER — Ambulatory Visit (INDEPENDENT_AMBULATORY_CARE_PROVIDER_SITE_OTHER): Payer: Medicare Other | Admitting: Nurse Practitioner

## 2019-10-28 VITALS — BP 132/68 | HR 76 | Resp 17 | Ht 72.0 in | Wt 275.0 lb

## 2019-10-28 DIAGNOSIS — I890 Lymphedema, not elsewhere classified: Secondary | ICD-10-CM | POA: Diagnosis not present

## 2019-10-28 NOTE — Progress Notes (Signed)
History of Present Illness  There is no documented history at this time  Assessments & Plan   There are no diagnoses linked to this encounter.    Additional instructions  Subjective:  Patient presents with venous ulcer of the Bilateral lower extremity.    Procedure:  3 layer unna wrap was placed Bilateral lower extremity.   Plan:   Follow up in one week.  

## 2019-11-01 ENCOUNTER — Telehealth: Payer: Self-pay | Admitting: *Deleted

## 2019-11-01 ENCOUNTER — Encounter (INDEPENDENT_AMBULATORY_CARE_PROVIDER_SITE_OTHER): Payer: Medicare Other

## 2019-11-03 ENCOUNTER — Ambulatory Visit (INDEPENDENT_AMBULATORY_CARE_PROVIDER_SITE_OTHER): Payer: Medicare Other | Admitting: Nurse Practitioner

## 2019-11-03 ENCOUNTER — Other Ambulatory Visit: Payer: Self-pay

## 2019-11-03 ENCOUNTER — Encounter (INDEPENDENT_AMBULATORY_CARE_PROVIDER_SITE_OTHER): Payer: Self-pay

## 2019-11-03 DIAGNOSIS — I890 Lymphedema, not elsewhere classified: Secondary | ICD-10-CM | POA: Diagnosis not present

## 2019-11-03 NOTE — Progress Notes (Signed)
History of Present Illness  There is no documented history at this time  Assessments & Plan   There are no diagnoses linked to this encounter.    Additional instructions  Subjective:  Patient presents with venous ulcer of the Bilateral lower extremity.    Procedure:  3 layer unna wrap was placed Bilateral lower extremity.   Plan:   Follow up in one week.  

## 2019-11-06 ENCOUNTER — Encounter (INDEPENDENT_AMBULATORY_CARE_PROVIDER_SITE_OTHER): Payer: Self-pay | Admitting: Nurse Practitioner

## 2019-11-08 ENCOUNTER — Encounter (INDEPENDENT_AMBULATORY_CARE_PROVIDER_SITE_OTHER): Payer: Self-pay

## 2019-11-08 ENCOUNTER — Ambulatory Visit (INDEPENDENT_AMBULATORY_CARE_PROVIDER_SITE_OTHER): Payer: Medicare Other | Admitting: Nurse Practitioner

## 2019-11-08 ENCOUNTER — Other Ambulatory Visit: Payer: Self-pay

## 2019-11-08 ENCOUNTER — Encounter (INDEPENDENT_AMBULATORY_CARE_PROVIDER_SITE_OTHER): Payer: Medicare Other

## 2019-11-08 VITALS — BP 161/85 | HR 81 | Resp 16 | Wt 267.0 lb

## 2019-11-08 DIAGNOSIS — I890 Lymphedema, not elsewhere classified: Secondary | ICD-10-CM | POA: Diagnosis not present

## 2019-11-08 NOTE — Progress Notes (Signed)
History of Present Illness  There is no documented history at this time  Assessments & Plan   There are no diagnoses linked to this encounter.    Additional instructions  Subjective:  Patient presents with venous ulcer of the Bilateral lower extremity.    Procedure:  3 layer unna wrap was placed Bilateral lower extremity.   Plan:   Follow up in one week.  

## 2019-11-14 ENCOUNTER — Encounter (INDEPENDENT_AMBULATORY_CARE_PROVIDER_SITE_OTHER): Payer: Self-pay | Admitting: Nurse Practitioner

## 2019-11-15 ENCOUNTER — Other Ambulatory Visit: Payer: Self-pay

## 2019-11-15 ENCOUNTER — Ambulatory Visit (INDEPENDENT_AMBULATORY_CARE_PROVIDER_SITE_OTHER): Payer: Medicare Other | Admitting: Vascular Surgery

## 2019-11-15 ENCOUNTER — Encounter (INDEPENDENT_AMBULATORY_CARE_PROVIDER_SITE_OTHER): Payer: Self-pay

## 2019-11-15 VITALS — BP 182/82 | HR 88 | Resp 16 | Wt 270.0 lb

## 2019-11-15 DIAGNOSIS — I890 Lymphedema, not elsewhere classified: Secondary | ICD-10-CM

## 2019-11-15 DIAGNOSIS — R600 Localized edema: Secondary | ICD-10-CM | POA: Diagnosis not present

## 2019-11-15 NOTE — Progress Notes (Signed)
History of Present Illness  There is no documented history at this time  Assessments & Plan   There are no diagnoses linked to this encounter.    Additional instructions  Subjective:  Patient presents with venous ulcer of the Bilateral lower extremity.    Procedure:  3 layer unna wrap was placed Bilateral lower extremity.   Plan:   Follow up in one week.  

## 2019-11-20 ENCOUNTER — Other Ambulatory Visit: Payer: Self-pay

## 2019-11-20 DIAGNOSIS — D509 Iron deficiency anemia, unspecified: Secondary | ICD-10-CM

## 2019-11-20 NOTE — Progress Notes (Signed)
[name]  [phone] [phone]  ID: Ah Bott OB: February 15, 1946  MR#: [id number]  LKT#:[id number]  Patient Care Team: Derinda Late, MD as PCP - General (Family Medicine)   CHIEF COMPLAINT: Anemia in chronic kidney disease, mild iron deficiency.  INTERVAL HISTORY: Patient returns to clinic today for further evaluation and continuation of Proc[place].  He continues to have significant back pain and is now undergoing physical therapy since surgery is no longer an option.  He also is having left-sided rib pain making it difficult to take deep breaths and he feels like he fractured a rib.  He has no neurologic complaints.  He denies any chest pain, shortness of breath, cough, or hemoptysis.  He denies any abdominal pain.  He has no nausea, vomiting, constipation, or diarrhea.  He denies any melena or hematochezia.  He has no urinary complaints.  Patient appears uncomfortable, but offers no further specific complaints.  REVIEW OF SYSTEMS:   Review of Systems  Constitutional: Positive for malaise/fatigue. Negative for fever and weight loss.  Respiratory: Negative.  Negative for cough, hemoptysis and shortness of breath.   Cardiovascular: Negative.  Negative for chest pain and leg swelling.  Gastrointestinal: Negative.  Negative for abdominal pain and constipation.  Genitourinary: Positive for flank pain. Negative for hematuria.  Musculoskeletal: Positive for back pain.  Skin: Negative.  Negative for rash.  Neurological: Positive for weakness. Negative for dizziness, sensory change, focal weakness and headaches.  Psychiatric/Behavioral: Negative.  The patient is not nervous/anxious.     As per HPI. Otherwise, a complete review of systems is negative.  PAST MEDICAL HISTORY: Past Medical History:  Diagnosis Date  . Chronic back pain   . COPD (chronic obstructive pulmonary disease) ([place])   . Depression   . Diabetes mellitus without  complication ([place])   . Hyperlipidemia   . Hypertension   . Renal cell carcinoma ([place])   . Sleep apnea     PAST SURGICAL HISTORY: Past Surgical History:  Procedure Laterality Date  . CARPAL TUNNEL RELEASE    . CHOLECYSTECTOMY    . COLONOSCOPY WITH PROPOFOL N/A 07/29/2017   Procedure: COLONOSCOPY WITH PROPOFOL;  Surgeon: Manya Silvas, MD;  Location[facility] ENDOSCOPY;  Service: Endoscopy;  Laterality: N/A;  . colonoscopys    . ESOPHAGOGASTRODUODENOSCOPY (EGD) WITH PROPOFOL N/A 07/29/2017   Procedure: ESOPHAGOGASTRODUODENOSCOPY (EGD) WITH PROPOFOL;  Surgeon: Manya Silvas, MD;  Location: [facility] ENDOSCOPY;  Service: Endoscopy;  Laterality: N/A;  . FLEXIBLE SIGMOIDOSCOPY    . GASTRIC BYPASS    . NEPHRECTOMY    . open reduction and internal fixation, right distal radius    . right knee arthroscopy    . UPPER GI ENDOSCOPY      FAMILY HISTORY: Family History  Problem Relation Age of Onset  . Diabetes Mother   . Cancer Mother   . Heart disease Father     ADVANCED DIRECTIVES (Y/N):  N  HEALTH MAINTENANCE: Social History   Tobacco Use  . Smoking status: Former Smoker    Packs/day: 1.00    Years: 30.00    Pack years: 30.00  . Smokeless tobacco: Current User    Types: Chew  Substance Use Topics  . Alcohol use: No    Frequency: Never  . Drug use: No     Colonoscopy:  PAP:  Bone density:  Lipid panel:  No Known Allergies  Current Outpatient Medications  Medication Sig Dispense Refill  . ANORO ELLIPTA 62.5-25 MCG/INH AEPB Inhale 1 puff  into the lungs daily.     Marland Kitchen apixaban (ELIQUIS) 5 MG TABS tablet Take 5 mg by mouth every 12 (twelve) hours.     Marland Kitchen atorvastatin (LIPITOR) 10 MG tablet Take 10 mg by mouth daily.    . carvedilol (COREG) 6.25 MG tablet Take 6.25 mg by mouth 2 (two) times daily with a meal.    . doxycycline (VIBRAMYCIN) 100 MG capsule Take 1 capsule (100 mg total) by mouth daily. 7 capsule 0  . DULoxetine (CYMBALTA) 30 MG capsule Take 30 mg by mouth  daily.    . DULoxetine (CYMBALTA) 60 MG capsule 60 mg daily.     . fluticasone (FLONASE) 50 MCG/ACT nasal spray Place 1 spray into both nostrils 2 (two) times a day.    . furosemide (LASIX) 40 MG tablet Take 1 tablet (40 mg total) by mouth daily. (Patient taking differently: Take 40 mg by mouth 2 (two) times daily. ) 30 tablet 0  . gabapentin (NEURONTIN) 300 MG capsule Take 300 mg by mouth 2 (two) times daily.     Marland Kitchen glipiZIDE (GLUCOTROL XL) 2.5 MG 24 hr tablet Take 1 tablet (2.5 mg total) by mouth daily with breakfast. 30 tablet 0  . Multiple Vitamin (MULTIVITAMIN) tablet Take 1 tablet by mouth daily.    Marland Kitchen oxyCODONE-acetaminophen (PERCOCET) 10-325 MG tablet Take 1-2 tablets by mouth 2 (two) times daily as needed for pain. Must last 30 days. 75 tablet 0  . [START ON 11/28/2019] oxyCODONE-acetaminophen (PERCOCET) 10-325 MG tablet Take 1-2 tablets by mouth 2 (two) times daily as needed for pain. Must last 30 days. 75 tablet 0  . [START ON 12/28/2019] oxyCODONE-acetaminophen (PERCOCET) 10-325 MG tablet Take 1-2 tablets by mouth 2 (two) times daily as needed for pain. Must last 30 days. 75 tablet 0  . pantoprazole (PROTONIX) 40 MG tablet Take 40 mg by mouth 2 (two) times daily.     Marland Kitchen PARoxetine (PAXIL) 40 MG tablet Take 40 mg by mouth every morning.     . pramipexole (MIRAPEX) 1 MG tablet Take 1 mg by mouth 2 (two) times a day.     . sodium zirconium cyclosilicate (LOKELMA) 10 g PACK packet Take 10 g by mouth 2 (two) times daily. 60 packet 0  . sucralfate (CARAFATE) 1 g tablet Take 1 g by mouth 3 (three) times daily.     Marland Kitchen tiZANidine (ZANAFLEX) 4 MG tablet Take 4 mg by mouth every 8 (eight) hours as needed for muscle spasms.    . traMADol (ULTRAM) 50 MG tablet Take 50 mg by mouth every 6 (six) hours as needed.     No current facility-administered medications for this visit.     OBJECTIVE: Vitals:   11/22/19 1027  BP: (!) 161/70  Pulse: 94  Resp: 20  Temp: 98 F (36.7 C)  SpO2: 96%     Body  mass index is 35.98 kg/m.    ECOG FS:0 - Asymptomatic  General: Well-developed, well-nourished, no acute distress. Eyes: Pink conjunctiva, anicteric sclera. HEENT: Normocephalic, moist mucous membranes. Lungs: Clear to auscultation bilaterally. Heart: Regular rate and rhythm. No rubs, murmurs, or gallops. Abdomen: Soft, nontender, nondistended. No organomegaly noted, normoactive bowel sounds. Musculoskeletal: Bilateral lower extremity and vascular wraps. Neuro: Alert, answering all questions appropriately. Cranial nerves grossly intact. Skin: No rashes or petechiae noted. Psych: Normal affect.   LAB RESULTS:  Lab Results  Component Value Date   NA 141 07/15/2019   K 3.5 07/15/2019   CL 111 07/15/2019  CO2 20 (L) 07/15/2019   GLUCOSE 209 (H) 07/15/2019   BUN 42 (H) 07/15/2019   CREATININE 1.77 (H) 07/15/2019   CALCIUM 8.3 (L) 07/15/2019   PROT 7.1 07/05/2019   ALBUMIN 3.8 07/05/2019   AST 27 07/05/2019   ALT 35 07/05/2019   ALKPHOS 120 07/05/2019   BILITOT 0.4 07/05/2019   GFRNONAA 38 (L) 07/15/2019   GFRAA 44 (L) 07/15/2019    Lab Results  Component Value Date   WBC 6.2 11/22/2019   NEUTROABS 4.8 11/22/2019   HGB 9.8 (L) 11/22/2019   HCT 33.7 (L) 11/22/2019   MCV 86.0 11/22/2019   PLT 217 11/22/2019   Lab Results  Component Value Date   IRON 32 (L) 11/22/2019   TIBC 334 11/22/2019   IRONPCTSAT 10 (L) 11/22/2019   Lab Results  Component Value Date   FERRITIN 12 (L) 11/22/2019     STUDIES: Dg Chest 2 View  Result Date: 11/22/2019 CLINICAL DATA:  Left-sided chest pain for 1 week.  Dyspnea. EXAM: CHEST - 2 VIEW COMPARISON:  07/07/2019 FINDINGS: The heart size and mediastinal contours are within normal limits. Low lung volumes again noted. Both lungs are clear. The visualized skeletal structures are unremarkable. IMPRESSION: No active cardiopulmonary disease. Electronically Signed   By: Marlaine Hind M.D.   On: 11/22/2019 15:30    ASSESSMENT: Anemia in  chronic kidney disease, mild iron deficiency.  PLAN:    1. Anemia in chronic kidney disease, mild iron deficiency: Patient's hemoglobin remains below 10.0 therefore will proceed with 40,000 units of Retacrit today.  He also has a mild iron deficiency and will return to clinic next week to receive 1 infusion of 510 mg IV Feraheme.  Return to clinic every 4 weeks for laboratory work and Retacrit if his hemoglobin remains below 10.0.  He would then return to clinic in 4 months for further evaluation and continuation of treatment.   2.  Chronic renal insufficiency: Chronic and unchanged.  Continue treatment and monitoring per nephrology. 3.  Vascular insufficiency: Continue follow-up with vascular surgery as scheduled. 4.  Back pain: Continue follow-up and treatment per physical therapy. 5.  Rib pain: Chest x-ray from today did not reveal any fractures.  Patient expressed understanding and was in agreement with this plan. He also understands that He can call clinic at any time with any questions, concerns, or complaints.    Lloyd Huger, MD   11/22/2019 4:07 PM

## 2019-11-21 ENCOUNTER — Encounter: Payer: Self-pay | Admitting: Oncology

## 2019-11-21 ENCOUNTER — Other Ambulatory Visit: Payer: Self-pay

## 2019-11-22 ENCOUNTER — Inpatient Hospital Stay: Payer: Medicare Other | Attending: Oncology | Admitting: Oncology

## 2019-11-22 ENCOUNTER — Ambulatory Visit (INDEPENDENT_AMBULATORY_CARE_PROVIDER_SITE_OTHER): Payer: Medicare Other | Admitting: Nurse Practitioner

## 2019-11-22 ENCOUNTER — Inpatient Hospital Stay (HOSPITAL_BASED_OUTPATIENT_CLINIC_OR_DEPARTMENT_OTHER): Payer: Medicare Other | Admitting: Oncology

## 2019-11-22 ENCOUNTER — Other Ambulatory Visit: Payer: Self-pay

## 2019-11-22 ENCOUNTER — Telehealth: Payer: Self-pay | Admitting: Emergency Medicine

## 2019-11-22 ENCOUNTER — Ambulatory Visit
Admission: RE | Admit: 2019-11-22 | Discharge: 2019-11-22 | Disposition: A | Discharge status: Home / Self Care | Payer: Medicare Other | Source: Ambulatory Visit | Attending: Oncology | Admitting: Oncology

## 2019-11-22 ENCOUNTER — Encounter (INDEPENDENT_AMBULATORY_CARE_PROVIDER_SITE_OTHER): Payer: Self-pay | Admitting: Nurse Practitioner

## 2019-11-22 ENCOUNTER — Encounter (INDEPENDENT_AMBULATORY_CARE_PROVIDER_SITE_OTHER): Payer: Medicare Other

## 2019-11-22 ENCOUNTER — Inpatient Hospital Stay: Payer: Medicare Other

## 2019-11-22 VITALS — BP 173/79 | HR 86 | Resp 16 | Ht 72.0 in | Wt 264.0 lb

## 2019-11-22 VITALS — BP 161/70 | HR 94 | Temp 98.0°F | Resp 20 | Wt 265.3 lb

## 2019-11-22 DIAGNOSIS — D631 Anemia in chronic kidney disease: Secondary | ICD-10-CM | POA: Diagnosis not present

## 2019-11-22 DIAGNOSIS — Z85528 Personal history of other malignant neoplasm of kidney: Secondary | ICD-10-CM | POA: Diagnosis not present

## 2019-11-22 DIAGNOSIS — G4730 Sleep apnea, unspecified: Secondary | ICD-10-CM | POA: Diagnosis not present

## 2019-11-22 DIAGNOSIS — R0789 Other chest pain: Secondary | ICD-10-CM | POA: Insufficient documentation

## 2019-11-22 DIAGNOSIS — D509 Iron deficiency anemia, unspecified: Secondary | ICD-10-CM

## 2019-11-22 DIAGNOSIS — N189 Chronic kidney disease, unspecified: Secondary | ICD-10-CM | POA: Diagnosis not present

## 2019-11-22 DIAGNOSIS — Z7984 Long term (current) use of oral hypoglycemic drugs: Secondary | ICD-10-CM | POA: Insufficient documentation

## 2019-11-22 DIAGNOSIS — Z79899 Other long term (current) drug therapy: Secondary | ICD-10-CM | POA: Insufficient documentation

## 2019-11-22 DIAGNOSIS — R0781 Pleurodynia: Secondary | ICD-10-CM | POA: Insufficient documentation

## 2019-11-22 DIAGNOSIS — M549 Dorsalgia, unspecified: Secondary | ICD-10-CM | POA: Insufficient documentation

## 2019-11-22 DIAGNOSIS — E785 Hyperlipidemia, unspecified: Secondary | ICD-10-CM | POA: Insufficient documentation

## 2019-11-22 DIAGNOSIS — E1122 Type 2 diabetes mellitus with diabetic chronic kidney disease: Secondary | ICD-10-CM | POA: Insufficient documentation

## 2019-11-22 DIAGNOSIS — I890 Lymphedema, not elsewhere classified: Secondary | ICD-10-CM

## 2019-11-22 DIAGNOSIS — Z87891 Personal history of nicotine dependence: Secondary | ICD-10-CM | POA: Insufficient documentation

## 2019-11-22 DIAGNOSIS — Z905 Acquired absence of kidney: Secondary | ICD-10-CM | POA: Insufficient documentation

## 2019-11-22 DIAGNOSIS — F329 Major depressive disorder, single episode, unspecified: Secondary | ICD-10-CM | POA: Diagnosis not present

## 2019-11-22 DIAGNOSIS — J449 Chronic obstructive pulmonary disease, unspecified: Secondary | ICD-10-CM | POA: Insufficient documentation

## 2019-11-22 DIAGNOSIS — Z7901 Long term (current) use of anticoagulants: Secondary | ICD-10-CM | POA: Diagnosis not present

## 2019-11-22 DIAGNOSIS — Z9884 Bariatric surgery status: Secondary | ICD-10-CM | POA: Insufficient documentation

## 2019-11-22 DIAGNOSIS — I129 Hypertensive chronic kidney disease with stage 1 through stage 4 chronic kidney disease, or unspecified chronic kidney disease: Secondary | ICD-10-CM | POA: Diagnosis not present

## 2019-11-22 LAB — CBC WITH DIFFERENTIAL/PLATELET
Abs Immature Granulocytes: 0.02 10*3/uL (ref 0.00–0.07)
Basophils Absolute: 0 10*3/uL (ref 0.0–0.1)
Basophils Relative: 1 %
Eosinophils Absolute: 0.2 10*3/uL (ref 0.0–0.5)
Eosinophils Relative: 3 %
HCT: 33.7 % — ABNORMAL LOW (ref 39.0–52.0)
Hemoglobin: 9.8 g/dL — ABNORMAL LOW (ref 13.0–17.0)
Immature Granulocytes: 0 %
Lymphocytes Relative: 15 %
Lymphs Abs: 0.9 10*3/uL (ref 0.7–4.0)
MCH: 25 pg — ABNORMAL LOW (ref 26.0–34.0)
MCHC: 29.1 g/dL — ABNORMAL LOW (ref 30.0–36.0)
MCV: 86 fL (ref 80.0–100.0)
Monocytes Absolute: 0.3 10*3/uL (ref 0.1–1.0)
Monocytes Relative: 5 %
Neutro Abs: 4.8 10*3/uL (ref 1.7–7.7)
Neutrophils Relative %: 76 %
Platelets: 217 10*3/uL (ref 150–400)
RBC: 3.92 MIL/uL — ABNORMAL LOW (ref 4.22–5.81)
RDW: 17.1 % — ABNORMAL HIGH (ref 11.5–15.5)
WBC: 6.2 10*3/uL (ref 4.0–10.5)
nRBC: 0 % (ref 0.0–0.2)

## 2019-11-22 LAB — IRON AND TIBC
Iron: 32 ug/dL — ABNORMAL LOW (ref 45–182)
Saturation Ratios: 10 % — ABNORMAL LOW (ref 17.9–39.5)
TIBC: 334 ug/dL (ref 250–450)
UIBC: 302 ug/dL

## 2019-11-22 LAB — FERRITIN: Ferritin: 12 ng/mL — ABNORMAL LOW (ref 24–336)

## 2019-11-22 MED ORDER — EPOETIN ALFA-EPBX 40000 UNIT/ML IJ SOLN
40000.0000 [IU] | Freq: Once | INTRAMUSCULAR | Status: AC
  Administered 2019-11-22: 12:00:00 40000 [IU] via SUBCUTANEOUS
  Filled 2019-11-22: qty 1

## 2019-11-22 NOTE — Progress Notes (Signed)
History of Present Illness  There is no documented history at this time  Assessments & Plan   There are no diagnoses linked to this encounter.    Additional instructions  Subjective:  Patient presents with venous ulcer of the Bilateral lower extremity.    Procedure:  3 layer unna wrap was placed Bilateral lower extremity.   Plan:   Follow up in one week.  

## 2019-11-22 NOTE — Telephone Encounter (Signed)
Called pt to let him know results of CXR. Pt verbalized understanding. Advised pt to call him if he needed anything further.

## 2019-11-28 ENCOUNTER — Inpatient Hospital Stay: Payer: Medicare Other

## 2019-12-02 ENCOUNTER — Ambulatory Visit (INDEPENDENT_AMBULATORY_CARE_PROVIDER_SITE_OTHER): Payer: Medicare Other | Admitting: Vascular Surgery

## 2019-12-02 ENCOUNTER — Other Ambulatory Visit: Payer: Self-pay

## 2019-12-02 ENCOUNTER — Encounter (INDEPENDENT_AMBULATORY_CARE_PROVIDER_SITE_OTHER): Payer: Self-pay | Admitting: Vascular Surgery

## 2019-12-02 VITALS — BP 142/77 | HR 56 | Resp 16 | Wt 268.0 lb

## 2019-12-02 DIAGNOSIS — E1169 Type 2 diabetes mellitus with other specified complication: Secondary | ICD-10-CM | POA: Diagnosis not present

## 2019-12-02 DIAGNOSIS — R600 Localized edema: Secondary | ICD-10-CM

## 2019-12-02 DIAGNOSIS — I10 Essential (primary) hypertension: Secondary | ICD-10-CM

## 2019-12-02 DIAGNOSIS — I890 Lymphedema, not elsewhere classified: Secondary | ICD-10-CM | POA: Diagnosis not present

## 2019-12-02 NOTE — Progress Notes (Signed)
MRN : [id number]  [name] is a 73 y.o. (Nov 21, 1946) male who presents with chief complaint of  Chief Complaint  Patient presents with  . Follow-up    56month follow up  .  History of Present Illness: Patient returns today in follow up of his leg swelling and lymphedema.  His right leg is doing pretty well.  His left leg is better but continues to have significant swelling.  He has a couple of small areas of scab or ulceration on the calf although they are not terribly weeping today.  No fevers or chills.  Mild redness in the left lower leg but nothing significant.  He has been getting Unna boots for a little over a month on the left leg.  He has not been using his lymphedema pump while he is in [employer].  His activity is somewhat limited by both his leg and his back.  He is trying to elevate his legs.  Current Outpatient Medications  Medication Sig Dispense Refill  . apixaban (ELIQUIS) 5 MG TABS tablet Take 5 mg by mouth every 12 (twelve) hours.     Marland Kitchen atorvastatin (LIPITOR) 10 MG tablet Take 10 mg by mouth daily.    . carvedilol (COREG) 6.25 MG tablet Take 6.25 mg by mouth 2 (two) times daily with a meal.    . DULoxetine (CYMBALTA) 60 MG capsule 120 mg daily.     . furosemide (LASIX) 40 MG tablet Take 1 tablet (40 mg total) by mouth daily. (Patient taking differently: Take 40 mg by mouth 2 (two) times daily. ) 30 tablet 0  . glipiZIDE (GLUCOTROL XL) 2.5 MG 24 hr tablet Take 1 tablet (2.5 mg total) by mouth daily with breakfast. 30 tablet 0  . [START ON 12/28/2019] oxyCODONE-acetaminophen (PERCOCET) 10-325 MG tablet Take 1-2 tablets by mouth 2 (two) times daily as needed for pain. Must last 30 days. 75 tablet 0  . pantoprazole (PROTONIX) 40 MG tablet Take 40 mg by mouth 2 (two) times daily.     . potassium chloride (KLOR-CON) 20 MEQ packet Take by mouth daily.    . pramipexole (MIRAPEX) 1 MG tablet Take 1 mg by mouth 2 (two) times a day.     . sucralfate (CARAFATE) 1 g tablet  Take 1 g by mouth 3 (three) times daily.     Marland Kitchen tiZANidine (ZANAFLEX) 4 MG tablet Take 4 mg by mouth every 8 (eight) hours as needed for muscle spasms.    Jearl Klinefelter ELLIPTA 62.5-25 MCG/INH AEPB Inhale 1 puff into the lungs daily.     Marland Kitchen doxycycline (VIBRAMYCIN) 100 MG capsule Take 1 capsule (100 mg total) by mouth daily. (Patient not taking: Reported on 12/02/2019) 7 capsule 0  . DULoxetine (CYMBALTA) 30 MG capsule Take 30 mg by mouth daily.    . fluticasone (FLONASE) 50 MCG/ACT nasal spray Place 1 spray into both nostrils 2 (two) times a day.    . gabapentin (NEURONTIN) 300 MG capsule Take 300 mg by mouth 2 (two) times daily.     . Multiple Vitamin (MULTIVITAMIN) tablet Take 1 tablet by mouth daily.    Marland Kitchen oxyCODONE-acetaminophen (PERCOCET) 10-325 MG tablet Take 1-2 tablets by mouth 2 (two) times daily as needed for pain. Must last 30 days. (Patient not taking: Reported on 12/02/2019) 75 tablet 0  . PARoxetine (PAXIL) 40 MG tablet Take 40 mg by mouth every morning.     . sodium zirconium cyclosilicate (LOKELMA) 10 g PACK packet Take 10  g by mouth 2 (two) times daily. (Patient not taking: Reported on 12/02/2019) 60 packet 0  . traMADol (ULTRAM) 50 MG tablet Take 50 mg by mouth every 6 (six) hours as needed.     No current facility-administered medications for this visit.    Past Medical History:  Diagnosis Date  . Chronic back pain   . COPD (chronic obstructive pulmonary disease) ([place])   . Depression   . Diabetes mellitus without complication ([place])   . Hyperlipidemia   . Hypertension   . Renal cell carcinoma ([place])   . Sleep apnea     Past Surgical History:  Procedure Laterality Date  . CARPAL TUNNEL RELEASE    . CHOLECYSTECTOMY    . COLONOSCOPY WITH PROPOFOL N/A 07/29/2017   Procedure: COLONOSCOPY WITH PROPOFOL;  Surgeon: Manya Silvas, MD;  Location: [facility] ENDOSCOPY;  Service: Endoscopy;  Laterality: N/A;  . colonoscopys    . ESOPHAGOGASTRODUODENOSCOPY (EGD) WITH PROPOFOL N/A  07/29/2017   Procedure: ESOPHAGOGASTRODUODENOSCOPY (EGD) WITH PROPOFOL;  Surgeon: Manya Silvas, MD;  Location: [facility] ENDOSCOPY;  Service: Endoscopy;  Laterality: N/A;  . FLEXIBLE SIGMOIDOSCOPY    . GASTRIC BYPASS    . NEPHRECTOMY    . open reduction and internal fixation, right distal radius    . right knee arthroscopy    . UPPER GI ENDOSCOPY      Social History        Socioeconomic History  . Marital status: Married    Spouse name: Jackelyn Poling  . Number of children: Not on file  . Years of education: Not on file  . Highest education level: Not on file  Occupational History  . Occupation: Retired    Comment: VP of [employer]  Social Needs  . Financial resource strain: Not hard at all  . Food insecurity    Worry: Never true    Inability: Never true  . Transportation needs    Medical: No    Non-medical: No  Tobacco Use  . Smoking status: Former Smoker    Packs/day: 1.00    Years: 30.00    Pack years: 30.00  . Smokeless tobacco: Current User    Types: Chew  Substance and Sexual Activity  . Alcohol use: No    Frequency: Never  . Drug use: No  . Sexual activity: Yes  Lifestyle  . Physical activity    Days per week: 5 days    Minutes per session: 150+ min  . Stress: Not at all  Relationships  . Social connections    Talks on phone: More than three times a week    Gets together: More than three times a week    Attends religious service: More than 4 times per year    Active member of club or organization: No    Attends meetings of clubs or organizations: Not on file    Relationship status: Married  . Intimate partner violence    Fear of current or ex partner: No    Emotionally abused: No    Physically abused: No    Forced sexual activity: No  Other Topics Concern  . Not on file  Social History Narrative  . Not on file         Family History  Problem Relation Age of Onset  . Diabetes Mother   . Cancer Mother     . Heart disease Father     No Known Allergies   Review of Systems   Review of Systems: Negative Unless Checked  Constitutional: [] ?Weight loss[] ?Fever[] ?Chills Cardiac:[] ?Chest pain[] ? Atrial Fibrillation[] ?Palpitations [] ?Shortness of breath when laying flat [] ?Shortness of breath with exertion. [] ?Shortness of breath at rest Vascular: [] ?Pain in legs with walking[] ?Pain in legswith standing[] ?Pain in legs when laying flat [] ?Claudication  [] ?Pain in feet when laying flat [] ?History of DVT [] ?Phlebitis [x] ?Swelling in legs [x] ?Varicose veins [] ?Non-healing ulcers Pulmonary: [] ?Uses home oxygen [] ?Productive cough[] ?Hemoptysis [] ?Wheeze [] ?COPD [] ?Asthma Neurologic: [] ?Dizziness[] ?Seizures [] ?Blackouts[] ?History of stroke [] ?History of TIA[] ?Aphasia [] ?Temporary Blindness[] ?Weaknessor numbness in arm [] ?Weakness or numbnessin leg Musculoskeletal:[] ?Joint swelling [] ?Joint pain [x] ?Low back pain  [] ? History of Knee Replacement [] ?Arthritis [] ?back Surgeries[] ? Spinal Stenosis  Hematologic:[] ?Easy bruising[] ?Easy bleeding [] ?Hypercoagulable state [] ?Anemic Gastrointestinal:[] ?Diarrhea [] ?Vomiting[] ?Gastroesophageal reflux/heartburn[] ?Difficulty swallowing. [] ?Abdominal pain Genitourinary: [] ?Chronic kidney disease [] ?Difficulturination [] ?Anuric[] ?Blood in urine [] ?Frequenturination [] ?Burning with urination[] ?Hematuria Skin: [] ?Rashes [] ?Ulcers [x] ?Wounds Psychological: [x] ?History of anxiety[] ?History of major depression  [] ? Memory Difficulties   Physical Examination  BP (!) 142/77 (BP Location: Right Arm)   Pulse (!) 56   Resp 16   Wt 268 lb (121.6 kg)   BMI 36.35 kg/m  Gen:  WD/WN, NAD Head: [place]/AT, No temporalis wasting. Ear/Nose/Throat: Hearing grossly intact, nares w/o erythema or drainage Eyes: Conjunctiva clear. Sclera non-icteric Neck: Supple.  Trachea  midline Pulmonary:  Good air movement, no use of accessory muscles.  Cardiac: RRR, no JVD Vascular:  Vessel Right Left  Radial Palpable Palpable                          PT  trace palpable  not palpable  DP  1+ palpable  1+ palpable    Musculoskeletal: M/S 5/5 throughout.  No deformity or atrophy.  1+ right lower extremity edema, 2+ left lower extremity edema. Neurologic: Sensation grossly intact in extremities.  Symmetrical.  Speech is fluent.  Psychiatric: Judgment intact, Mood & affect appropriate for pt's clinical situation. Dermatologic: A couple of small scabs or slight open areas on the left calf and shin area       Labs Recent Results (from the past 2160 hour(s))  Iron and TIBC     Status: Abnormal   Collection Time: 09/19/19 11:48 AM  Result Value Ref Range   Iron 39 (L) 45 - 182 ug/dL   TIBC 380 250 - 450 ug/dL   Saturation Ratios 10 (L) 17.9 - 39.5 %   UIBC 341 ug/dL    Comment: Performed at [facility], [address] [place], [place] [address]  Ferritin     Status: Abnormal   Collection Time: 09/19/19 11:48 AM  Result Value Ref Range   Ferritin 10 (L) 24 - 336 ng/mL    Comment: Performed at [facility], [address] [place], [place] [address]  CBC with Differential/Platelet     Status: Abnormal   Collection Time: 09/19/19 11:48 AM  Result Value Ref Range   WBC 5.6 4.0 - 10.5 K/uL   RBC 3.51 (L) 4.22 - 5.81 MIL/uL   Hemoglobin 9.4 (L) 13.0 - 17.0 g/dL   HCT 30.9 (L) 39.0 - 52.0 %   MCV 88.0 80.0 - 100.0 fL   MCH 26.8 26.0 - 34.0 pg   MCHC 30.4 30.0 - 36.0 g/dL   RDW 15.8 (H) 11.5 - 15.5 %   Platelets 183 150 - 400 K/uL   nRBC 0.0 0.0 - 0.2 %   Neutrophils Relative % 70 %   Neutro Abs 3.9 1.7 - 7.7 K/uL   Lymphocytes Relative 19 %   Lymphs Abs 1.1 0.7 - 4.0 K/uL   Monocytes  Relative 7 %   Monocytes Absolute 0.4 0.1 - 1.0 K/uL   Eosinophils Relative 3 %   Eosinophils Absolute 0.2 0.0 - 0.5 K/uL   Basophils Relative 1 %     Basophils Absolute 0.0 0.0 - 0.1 K/uL   Immature Granulocytes 0 %   Abs Immature Granulocytes 0.02 0.00 - 0.07 K/uL    Comment: Performed at [facility], [address] [place], [place] [address]  Iron and TIBC     Status: Abnormal   Collection Time: 10/17/19  9:40 AM  Result Value Ref Range   Iron 33 (L) 45 - 182 ug/dL   TIBC 379 250 - 450 ug/dL   Saturation Ratios 9 (L) 17.9 - 39.5 %   UIBC 346 ug/dL    Comment: Performed at [facility], [address] [place], [place] [address]  Ferritin     Status: Abnormal   Collection Time: 10/17/19  9:40 AM  Result Value Ref Range   Ferritin 16 (L) 24 - 336 ng/mL    Comment: Performed at [facility], [address] [place], [place] [address]  CBC with Differential/Platelet     Status: Abnormal   Collection Time: 10/17/19  9:40 AM  Result Value Ref Range   WBC 13.2 (H) 4.0 - 10.5 K/uL   RBC 4.01 (L) 4.22 - 5.81 MIL/uL   Hemoglobin 10.3 (L) 13.0 - 17.0 g/dL   HCT 33.0 (L) 39.0 - 52.0 %   MCV 82.3 80.0 - 100.0 fL   MCH 25.7 (L) 26.0 - 34.0 pg   MCHC 31.2 30.0 - 36.0 g/dL   RDW 16.6 (H) 11.5 - 15.5 %   Platelets 201 150 - 400 K/uL   nRBC 0.0 0.0 - 0.2 %   Neutrophils Relative % 91 %   Neutro Abs 12.0 (H) 1.7 - 7.7 K/uL   Lymphocytes Relative 5 %   Lymphs Abs 0.7 0.7 - 4.0 K/uL   Monocytes Relative 4 %   Monocytes Absolute 0.5 0.1 - 1.0 K/uL   Eosinophils Relative 0 %   Eosinophils Absolute 0.1 0.0 - 0.5 K/uL   Basophils Relative 0 %   Basophils Absolute 0.0 0.0 - 0.1 K/uL   Immature Granulocytes 0 %   Abs Immature Granulocytes 0.04 0.00 - 0.07 K/uL    Comment: Performed at [facility] [address] [place], [place] [address]  Ferritin     Status: Abnormal   Collection Time: 11/22/19 10:10 AM  Result Value Ref Range   Ferritin 12 (L) 24 - 336 ng/mL    Comment: Performed at [facility], [address] [place], [place] [address]  Iron and TIBC     Status: Abnormal   Collection  Time: 11/22/19 10:10 AM  Result Value Ref Range   Iron 32 (L) 45 - 182 ug/dL   TIBC 334 250 - 450 ug/dL   Saturation Ratios 10 (L) 17.9 - 39.5 %   UIBC 302 ug/dL    Comment: Performed at [facility], [address] [place],  [address]  CBC with Differential/Platelet     Status: Abnormal   Collection Time: 11/22/19 10:10 AM  Result Value Ref Range   WBC 6.2 4.0 - 10.5 K/uL   RBC 3.92 (L) 4.22 - 5.81 MIL/uL   Hemoglobin 9.8 (L) 13.0 - 17.0 g/dL   HCT 33.7 (L) 39.0 - 52.0 %   MCV 86.0 80.0 - 100.0 fL   MCH 25.0 (L) 26.0 - 34.0  pg   MCHC 29.1 (L) 30.0 - 36.0 g/dL   RDW 17.1 (H) 11.5 - 15.5 %   Platelets 217 150 - 400 K/uL   nRBC 0.0 0.0 - 0.2 %   Neutrophils Relative % 76 %   Neutro Abs 4.8 1.7 - 7.7 K/uL   Lymphocytes Relative 15 %   Lymphs Abs 0.9 0.7 - 4.0 K/uL   Monocytes Relative 5 %   Monocytes Absolute 0.3 0.1 - 1.0 K/uL   Eosinophils Relative 3 %   Eosinophils Absolute 0.2 0.0 - 0.5 K/uL   Basophils Relative 1 %   Basophils Absolute 0.0 0.0 - 0.1 K/uL   Immature Granulocytes 0 %   Abs Immature Granulocytes 0.02 0.00 - 0.07 K/uL    Comment: Performed at [facility], [address] [place], [place] [address]    Radiology DG Chest 2 View  Result Date: 11/22/2019 CLINICAL DATA:  Left-sided chest pain for 1 week.  Dyspnea. EXAM: CHEST - 2 VIEW COMPARISON:  07/07/2019 FINDINGS: The heart size and mediastinal contours are within normal limits. Low lung volumes again noted. Both lungs are clear. The visualized skeletal structures are unremarkable. IMPRESSION: No active cardiopulmonary disease. Electronically Signed   By: Marlaine Hind M.D.   On: 11/22/2019 15:30    Assessment/Plan  Essential hypertension blood pressure control important in reducing the progression of atherosclerotic disease. On appropriate oral medications.   Diabetes ([place]) blood glucose control important in reducing the progression of atherosclerotic disease. Also, involved in  wound healing. On appropriate medications.   Lymphedema We are going to continue to wrap his left leg in Unna boots until the skin is healed and the swelling is a little better.  I think this will take another 3 weeks or so.  He should try to elevate his leg is much as possible and ambulate.  I am also okay if he uses the lymphedema pump once or twice daily even with the Unna boots on.  He will continue the compression stocking on his right leg.  We will see him back in 3 to 4 weeks.    Leotis Pain, MD  12/02/2019 10:38 AM    This note was created with Dragon medical transcription system.  Any errors from dictation are purely unintentional

## 2019-12-02 NOTE — Patient Instructions (Signed)

## 2019-12-02 NOTE — Assessment & Plan Note (Signed)
blood glucose control important in reducing the progression of atherosclerotic disease. Also, involved in wound healing. On appropriate medications.  

## 2019-12-02 NOTE — Assessment & Plan Note (Signed)
We are going to continue to wrap his left leg in Unna boots until the skin is healed and the swelling is a little better.  I think this will take another 3 weeks or so.  He should try to elevate his leg is much as possible and ambulate.  I am also okay if he uses the lymphedema pump once or twice daily even with the Unna boots on.  He will continue the compression stocking on his right leg.  We will see him back in 3 to 4 weeks.

## 2019-12-02 NOTE — Assessment & Plan Note (Signed)
blood pressure control important in reducing the progression of atherosclerotic disease. On appropriate oral medications.  

## 2019-12-07 ENCOUNTER — Encounter (INDEPENDENT_AMBULATORY_CARE_PROVIDER_SITE_OTHER): Payer: Self-pay | Admitting: Nurse Practitioner

## 2019-12-07 ENCOUNTER — Other Ambulatory Visit: Payer: Self-pay

## 2019-12-07 ENCOUNTER — Ambulatory Visit (INDEPENDENT_AMBULATORY_CARE_PROVIDER_SITE_OTHER): Payer: Medicare Other | Admitting: Nurse Practitioner

## 2019-12-07 VITALS — BP 133/68 | HR 87 | Resp 16 | Wt 268.0 lb

## 2019-12-07 DIAGNOSIS — I890 Lymphedema, not elsewhere classified: Secondary | ICD-10-CM | POA: Diagnosis not present

## 2019-12-07 NOTE — Progress Notes (Signed)
History of Present Illness  There is no documented history at this time  Assessments & Plan   There are no diagnoses linked to this encounter.    Additional instructions  Subjective:  Patient presents with venous ulcer of the Left lower extremity.    Procedure:  3 layer unna wrap was placed Left lower extremity.   Plan:   Follow up in one week.  

## 2019-12-12 ENCOUNTER — Encounter (INDEPENDENT_AMBULATORY_CARE_PROVIDER_SITE_OTHER): Payer: Self-pay | Admitting: Nurse Practitioner

## 2019-12-13 ENCOUNTER — Encounter (INDEPENDENT_AMBULATORY_CARE_PROVIDER_SITE_OTHER): Payer: Self-pay | Admitting: Nurse Practitioner

## 2019-12-13 ENCOUNTER — Other Ambulatory Visit: Payer: Self-pay

## 2019-12-13 ENCOUNTER — Ambulatory Visit (INDEPENDENT_AMBULATORY_CARE_PROVIDER_SITE_OTHER): Payer: Medicare Other | Admitting: Nurse Practitioner

## 2019-12-13 VITALS — BP 158/75 | HR 88 | Resp 16 | Wt 270.0 lb

## 2019-12-13 DIAGNOSIS — I890 Lymphedema, not elsewhere classified: Secondary | ICD-10-CM

## 2019-12-13 NOTE — Progress Notes (Signed)
History of Present Illness  There is no documented history at this time  Assessments & Plan   There are no diagnoses linked to this encounter.    Additional instructions  Subjective:  Patient presents with venous ulcer of the Left lower extremity.    Procedure:  3 layer unna wrap was placed Left lower extremity.   Plan:   Follow up in one week.  

## 2019-12-16 ENCOUNTER — Encounter (INDEPENDENT_AMBULATORY_CARE_PROVIDER_SITE_OTHER): Payer: Self-pay | Admitting: Nurse Practitioner

## 2019-12-19 ENCOUNTER — Other Ambulatory Visit: Payer: Self-pay | Admitting: Emergency Medicine

## 2019-12-19 DIAGNOSIS — D509 Iron deficiency anemia, unspecified: Secondary | ICD-10-CM

## 2019-12-20 ENCOUNTER — Inpatient Hospital Stay: Payer: Medicare Other

## 2019-12-20 ENCOUNTER — Encounter (INDEPENDENT_AMBULATORY_CARE_PROVIDER_SITE_OTHER): Payer: Self-pay | Admitting: Vascular Surgery

## 2019-12-20 ENCOUNTER — Inpatient Hospital Stay: Payer: Medicare Other | Attending: Oncology

## 2019-12-20 ENCOUNTER — Ambulatory Visit (INDEPENDENT_AMBULATORY_CARE_PROVIDER_SITE_OTHER): Payer: Medicare Other | Admitting: Vascular Surgery

## 2019-12-20 ENCOUNTER — Other Ambulatory Visit: Payer: Self-pay

## 2019-12-20 VITALS — BP 150/78 | HR 94 | Resp 18 | Ht 72.0 in | Wt 267.0 lb

## 2019-12-20 DIAGNOSIS — Z85528 Personal history of other malignant neoplasm of kidney: Secondary | ICD-10-CM | POA: Insufficient documentation

## 2019-12-20 DIAGNOSIS — N1832 Chronic kidney disease, stage 3b: Secondary | ICD-10-CM

## 2019-12-20 DIAGNOSIS — E1122 Type 2 diabetes mellitus with diabetic chronic kidney disease: Secondary | ICD-10-CM | POA: Diagnosis not present

## 2019-12-20 DIAGNOSIS — J449 Chronic obstructive pulmonary disease, unspecified: Secondary | ICD-10-CM | POA: Insufficient documentation

## 2019-12-20 DIAGNOSIS — D509 Iron deficiency anemia, unspecified: Secondary | ICD-10-CM

## 2019-12-20 DIAGNOSIS — D631 Anemia in chronic kidney disease: Secondary | ICD-10-CM | POA: Diagnosis not present

## 2019-12-20 DIAGNOSIS — E785 Hyperlipidemia, unspecified: Secondary | ICD-10-CM | POA: Insufficient documentation

## 2019-12-20 DIAGNOSIS — Z79899 Other long term (current) drug therapy: Secondary | ICD-10-CM | POA: Diagnosis not present

## 2019-12-20 DIAGNOSIS — Z7901 Long term (current) use of anticoagulants: Secondary | ICD-10-CM | POA: Diagnosis not present

## 2019-12-20 DIAGNOSIS — R600 Localized edema: Secondary | ICD-10-CM | POA: Diagnosis not present

## 2019-12-20 DIAGNOSIS — R0781 Pleurodynia: Secondary | ICD-10-CM | POA: Diagnosis not present

## 2019-12-20 DIAGNOSIS — Z9884 Bariatric surgery status: Secondary | ICD-10-CM | POA: Insufficient documentation

## 2019-12-20 DIAGNOSIS — I10 Essential (primary) hypertension: Secondary | ICD-10-CM | POA: Diagnosis not present

## 2019-12-20 DIAGNOSIS — M549 Dorsalgia, unspecified: Secondary | ICD-10-CM | POA: Insufficient documentation

## 2019-12-20 DIAGNOSIS — G4730 Sleep apnea, unspecified: Secondary | ICD-10-CM | POA: Insufficient documentation

## 2019-12-20 DIAGNOSIS — Z87891 Personal history of nicotine dependence: Secondary | ICD-10-CM | POA: Insufficient documentation

## 2019-12-20 DIAGNOSIS — Z7984 Long term (current) use of oral hypoglycemic drugs: Secondary | ICD-10-CM | POA: Diagnosis not present

## 2019-12-20 DIAGNOSIS — E1169 Type 2 diabetes mellitus with other specified complication: Secondary | ICD-10-CM | POA: Diagnosis not present

## 2019-12-20 DIAGNOSIS — I890 Lymphedema, not elsewhere classified: Secondary | ICD-10-CM

## 2019-12-20 DIAGNOSIS — Z905 Acquired absence of kidney: Secondary | ICD-10-CM | POA: Insufficient documentation

## 2019-12-20 DIAGNOSIS — F329 Major depressive disorder, single episode, unspecified: Secondary | ICD-10-CM | POA: Insufficient documentation

## 2019-12-20 DIAGNOSIS — I129 Hypertensive chronic kidney disease with stage 1 through stage 4 chronic kidney disease, or unspecified chronic kidney disease: Secondary | ICD-10-CM | POA: Diagnosis not present

## 2019-12-20 DIAGNOSIS — N189 Chronic kidney disease, unspecified: Secondary | ICD-10-CM | POA: Insufficient documentation

## 2019-12-20 LAB — CBC WITH DIFFERENTIAL/PLATELET
Abs Immature Granulocytes: 0.01 10*3/uL (ref 0.00–0.07)
Basophils Absolute: 0 10*3/uL (ref 0.0–0.1)
Basophils Relative: 0 %
Eosinophils Absolute: 0.2 10*3/uL (ref 0.0–0.5)
Eosinophils Relative: 3 %
HCT: 36 % — ABNORMAL LOW (ref 39.0–52.0)
Hemoglobin: 10.6 g/dL — ABNORMAL LOW (ref 13.0–17.0)
Immature Granulocytes: 0 %
Lymphocytes Relative: 19 %
Lymphs Abs: 1.2 10*3/uL (ref 0.7–4.0)
MCH: 25.7 pg — ABNORMAL LOW (ref 26.0–34.0)
MCHC: 29.4 g/dL — ABNORMAL LOW (ref 30.0–36.0)
MCV: 87.2 fL (ref 80.0–100.0)
Monocytes Absolute: 0.4 10*3/uL (ref 0.1–1.0)
Monocytes Relative: 7 %
Neutro Abs: 4.4 10*3/uL (ref 1.7–7.7)
Neutrophils Relative %: 71 %
Platelets: 222 10*3/uL (ref 150–400)
RBC: 4.13 MIL/uL — ABNORMAL LOW (ref 4.22–5.81)
RDW: 18.9 % — ABNORMAL HIGH (ref 11.5–15.5)
WBC: 6.2 10*3/uL (ref 4.0–10.5)
nRBC: 0 % (ref 0.0–0.2)

## 2019-12-20 LAB — IRON AND TIBC
Iron: 49 ug/dL (ref 45–182)
Saturation Ratios: 13 % — ABNORMAL LOW (ref 17.9–39.5)
TIBC: 367 ug/dL (ref 250–450)
UIBC: 318 ug/dL

## 2019-12-20 LAB — FERRITIN: Ferritin: 9 ng/mL — ABNORMAL LOW (ref 24–336)

## 2019-12-20 NOTE — Assessment & Plan Note (Signed)
With his swelling worsening and him being more short of breath, I am concerned his kidney function could have declined some.  He sees his nephrologist regularly and I will reach out to him to get his opinion as well

## 2019-12-20 NOTE — Progress Notes (Signed)
MRN : [id number]  [name] is a 74 y.o. (Jul 21, 1946) male who presents with chief complaint of  Chief Complaint  Patient presents with  . Follow-up    4 wk check  .  History of Present Illness: Patient returns today in follow up of his leg swelling.  Even with Unna boots on, his swelling is not improving and actually a little worse.  He is short of breath and tachypneic today.  He has not seen his primary care doctor in about 6 months.  He does see a nephrologist regularly for known stage III chronic kidney disease.  He took the [employer] off last night.  He is got a few blisters but no weeping of the skin at this point.  No fevers or chills  Current Outpatient Medications  Medication Sig Dispense Refill  . ANORO ELLIPTA 62.5-25 MCG/INH AEPB Inhale 1 puff into the lungs daily.     Marland Kitchen apixaban (ELIQUIS) 5 MG TABS tablet Take 5 mg by mouth every 12 (twelve) hours.     Marland Kitchen atorvastatin (LIPITOR) 10 MG tablet Take 10 mg by mouth daily.    . carvedilol (COREG) 6.25 MG tablet Take 6.25 mg by mouth 2 (two) times daily with a meal.    . doxycycline (VIBRAMYCIN) 100 MG capsule Take 1 capsule (100 mg total) by mouth daily. 7 capsule 0  . DULoxetine (CYMBALTA) 30 MG capsule Take 30 mg by mouth daily.    . DULoxetine (CYMBALTA) 60 MG capsule 120 mg daily.     . fluticasone (FLONASE) 50 MCG/ACT nasal spray Place 1 spray into both nostrils 2 (two) times a day.    . furosemide (LASIX) 40 MG tablet Take 1 tablet (40 mg total) by mouth daily. (Patient taking differently: Take 40 mg by mouth 2 (two) times daily. ) 30 tablet 0  . gabapentin (NEURONTIN) 300 MG capsule Take 300 mg by mouth 2 (two) times daily.     Marland Kitchen glipiZIDE (GLUCOTROL XL) 2.5 MG 24 hr tablet Take 1 tablet (2.5 mg total) by mouth daily with breakfast. 30 tablet 0  . Multiple Vitamin (MULTIVITAMIN) tablet Take 1 tablet by mouth daily.    Marland Kitchen oxyCODONE-acetaminophen (PERCOCET) 10-325 MG tablet Take 1-2 tablets by mouth 2 (two) times  daily as needed for pain. Must last 30 days. 75 tablet 0  . [START ON 12/28/2019] oxyCODONE-acetaminophen (PERCOCET) 10-325 MG tablet Take 1-2 tablets by mouth 2 (two) times daily as needed for pain. Must last 30 days. 75 tablet 0  . pantoprazole (PROTONIX) 40 MG tablet Take 40 mg by mouth 2 (two) times daily.     Marland Kitchen PARoxetine (PAXIL) 40 MG tablet Take 40 mg by mouth every morning.     . potassium chloride (KLOR-CON) 20 MEQ packet Take by mouth daily.    . pramipexole (MIRAPEX) 1 MG tablet Take 1 mg by mouth 2 (two) times a day.     . sodium zirconium cyclosilicate (LOKELMA) 10 g PACK packet Take 10 g by mouth 2 (two) times daily. 60 packet 0  . sucralfate (CARAFATE) 1 g tablet Take 1 g by mouth 3 (three) times daily.     Marland Kitchen tiZANidine (ZANAFLEX) 4 MG tablet Take 4 mg by mouth every 8 (eight) hours as needed for muscle spasms.    . traMADol (ULTRAM) 50 MG tablet Take 50 mg by mouth every 6 (six) hours as needed.     No current facility-administered medications for this visit.    Past  Medical History:  Diagnosis Date  . Chronic back pain   . COPD (chronic obstructive pulmonary disease) ([place])   . Depression   . Diabetes mellitus without complication ([place])   . Hyperlipidemia   . Hypertension   . Renal cell carcinoma ([place])   . Sleep apnea     Past Surgical History:  Procedure Laterality Date  . CARPAL TUNNEL RELEASE    . CHOLECYSTECTOMY    . COLONOSCOPY WITH PROPOFOL N/A 07/29/2017   Procedure: COLONOSCOPY WITH PROPOFOL;  Surgeon: Manya Silvas, MD;  Location[facility] ENDOSCOPY;  Service: Endoscopy;  Laterality: N/A;  . colonoscopys    . ESOPHAGOGASTRODUODENOSCOPY (EGD) WITH PROPOFOL N/A 07/29/2017   Procedure: ESOPHAGOGASTRODUODENOSCOPY (EGD) WITH PROPOFOL;  Surgeon: Manya Silvas, MD;  Location: [facility] ENDOSCOPY;  Service: Endoscopy;  Laterality: N/A;  . FLEXIBLE SIGMOIDOSCOPY    . GASTRIC BYPASS    . NEPHRECTOMY    . open reduction and internal fixation, right distal radius    .  right knee arthroscopy    . UPPER GI ENDOSCOPY     Social History        Socioeconomic History  . Marital status: Married    Spouse name: Jackelyn Poling  . Number of children: Not on file  . Years of education: Not on file  . Highest education level: Not on file  Occupational History  . Occupation: Retired    Comment: VP of [employer]  Social Needs  . Financial resource strain: Not hard at all  . Food insecurity    Worry: Never true    Inability: Never true  . Transportation needs    Medical: No    Non-medical: No  Tobacco Use  . Smoking status: Former Smoker    Packs/day: 1.00    Years: 30.00    Pack years: 30.00  . Smokeless tobacco: Current User    Types: Chew  Substance and Sexual Activity  . Alcohol use: No    Frequency: Never  . Drug use: No  . Sexual activity: Yes  Lifestyle  . Physical activity    Days per week: 5 days    Minutes per session: 150+ min  . Stress: Not at all  Relationships  . Social connections    Talks on phone: More than three times a week    Gets together: More than three times a week    Attends religious service: More than 4 times per year    Active member of club or organization: No    Attends meetings of clubs or organizations: Not on file    Relationship status: Married  . Intimate partner violence    Fear of current or ex partner: No    Emotionally abused: No    Physically abused: No    Forced sexual activity: No  Other Topics Concern  . Not on file  Social History Narrative  . Not on file        Family History  Problem Relation Age of Onset  . Diabetes Mother   . Cancer Mother   . Heart disease Father   No Known Allergies  Review of Systems  Review of Systems: Negative Unless Checked  Constitutional: ??Weight loss ??Fever ??Chills  Cardiac: ??Chest pain ?? Atrial Fibrillation ??Palpitations ??Shortness of breath when laying flat ??Shortness of breath with exertion. ??Shortness of breath at rest  Vascular: ??Pain in legs with  walking ??Pain in legs with standing ??Pain in legs when laying flat ??Claudication ??Pain in feet when laying flat ??History  of DVT ??Phlebitis ??Swelling in legs ??Varicose veins ??Non-healing ulcers  Pulmonary: ??Uses home oxygen ??Productive cough ??Hemoptysis ??Wheeze ??COPD ??Asthma  Neurologic: ??Dizziness ??Seizures ??Blackouts ??History of stroke ??History of TIA ??Aphasia ??Temporary Blindness ??Weakness or numbness in arm ??Weakness or numbness in leg  Musculoskeletal: ??Joint swelling ??Joint pain ??Low back pain ?? History of Knee Replacement ??Arthritis ??back Surgeries ?? Spinal Stenosis  Hematologic: ??Easy bruising ??Easy bleeding ??Hypercoagulable state ??Anemic  Gastrointestinal: ??Diarrhea ??Vomiting ??Gastroesophageal reflux/heartburn ??Difficulty swallowing. ??Abdominal pain  Genitourinary: ??Chronic kidney disease ??Difficult urination ??Anuric ??Blood in urine ??Frequent urination ??Burning with urination ??Hematuria  Skin: ??Rashes ??Ulcers ??Wounds  Psychological: ??History of anxiety ?? History of major depression ?? Memory Difficulties     Physical Examination  BP (!) 150/78 (BP Location: Right Arm)   Pulse 94   Resp 18   Ht 6' (1.829 m)   Wt 267 lb (121.1 kg)   BMI 36.21 kg/m  Gen:  WD/WN, NAD Head: [place]/AT, No temporalis wasting. Ear/Nose/Throat: Hearing grossly intact, nares w/o erythema or drainage Eyes: Conjunctiva clear. Sclera non-icteric Neck: Supple.  Trachea midline Pulmonary:  Good air movement, a little more tachypneic and respirations somewhat labored today Cardiac: Somewhat irregular and slightly tachycardic Vascular:  Vessel Right Left  Radial Palpable Palpable                       Musculoskeletal: M/S 5/5 throughout.  No deformity or atrophy.  1-2+ right lower extremity edema, 2-3+ left lower extremity edema. Neurologic: Sensation grossly intact in extremities.  Symmetrical.  Speech is fluent.  Psychiatric: Judgment intact, Mood &  affect appropriate for pt's clinical situation. Dermatologic: No rashes or ulcers noted.  No cellulitis or open wounds.       Labs Recent Results (from the past 2160 hour(s))  Iron and TIBC     Status: Abnormal   Collection Time: 10/17/19  9:40 AM  Result Value Ref Range   Iron 33 (L) 45 - 182 ug/dL   TIBC 379 250 - 450 ug/dL   Saturation Ratios 9 (L) 17.9 - 39.5 %   UIBC 346 ug/dL    Comment: Performed at [facility], [address] [place], [place] [address]  Ferritin     Status: Abnormal   Collection Time: 10/17/19  9:40 AM  Result Value Ref Range   Ferritin 16 (L) 24 - 336 ng/mL    Comment: Performed at [facility], [address] [place], [place] [address]  CBC with Differential/Platelet     Status: Abnormal   Collection Time: 10/17/19  9:40 AM  Result Value Ref Range   WBC 13.2 (H) 4.0 - 10.5 K/uL   RBC 4.01 (L) 4.22 - 5.81 MIL/uL   Hemoglobin 10.3 (L) 13.0 - 17.0 g/dL   HCT 33.0 (L) 39.0 - 52.0 %   MCV 82.3 80.0 - 100.0 fL   MCH 25.7 (L) 26.0 - 34.0 pg   MCHC 31.2 30.0 - 36.0 g/dL   RDW 16.6 (H) 11.5 - 15.5 %   Platelets 201 150 - 400 K/uL   nRBC 0.0 0.0 - 0.2 %   Neutrophils Relative % 91 %   Neutro Abs 12.0 (H) 1.7 - 7.7 K/uL   Lymphocytes Relative 5 %   Lymphs Abs 0.7 0.7 - 4.0 K/uL   Monocytes Relative 4 %   Monocytes Absolute 0.5 0.1 - 1.0 K/uL   Eosinophils Relative 0 %   Eosinophils Absolute 0.1 0.0 - 0.5 K/uL  Basophils Relative 0 %   Basophils Absolute 0.0 0.0 - 0.1 K/uL   Immature Granulocytes 0 %   Abs Immature Granulocytes 0.04 0.00 - 0.07 K/uL    Comment: Performed at [facility], [address] [place], [place] [address]  Ferritin     Status: Abnormal   Collection Time: 11/22/19 10:10 AM  Result Value Ref Range   Ferritin 12 (L) 24 - 336 ng/mL    Comment: Performed at [facility], [address] [place], [place] [address]  Iron and TIBC     Status: Abnormal   Collection Time: 11/22/19 10:10 AM   Result Value Ref Range   Iron 32 (L) 45 - 182 ug/dL   TIBC 334 250 - 450 ug/dL   Saturation Ratios 10 (L) 17.9 - 39.5 %   UIBC 302 ug/dL    Comment: Performed at [facility], [address] [place], [place] [address]  CBC with Differential/Platelet     Status: Abnormal   Collection Time: 11/22/19 10:10 AM  Result Value Ref Range   WBC 6.2 4.0 - 10.5 K/uL   RBC 3.92 (L) 4.22 - 5.81 MIL/uL   Hemoglobin 9.8 (L) 13.0 - 17.0 g/dL   HCT 33.7 (L) 39.0 - 52.0 %   MCV 86.0 80.0 - 100.0 fL   MCH 25.0 (L) 26.0 - 34.0 pg   MCHC 29.1 (L) 30.0 - 36.0 g/dL   RDW 17.1 (H) 11.5 - 15.5 %   Platelets 217 150 - 400 K/uL   nRBC 0.0 0.0 - 0.2 %   Neutrophils Relative % 76 %   Neutro Abs 4.8 1.7 - 7.7 K/uL   Lymphocytes Relative 15 %   Lymphs Abs 0.9 0.7 - 4.0 K/uL   Monocytes Relative 5 %   Monocytes Absolute 0.3 0.1 - 1.0 K/uL   Eosinophils Relative 3 %   Eosinophils Absolute 0.2 0.0 - 0.5 K/uL   Basophils Relative 1 %   Basophils Absolute 0.0 0.0 - 0.1 K/uL   Immature Granulocytes 0 %   Abs Immature Granulocytes 0.02 0.00 - 0.07 K/uL    Comment: Performed at [facility], [place], [place] [address]  Iron and TIBC     Status: Abnormal   Collection Time: 12/20/19 11:18 AM  Result Value Ref Range   Iron 49 45 - 182 ug/dL   TIBC 367 250 - 450 ug/dL   Saturation Ratios 13 (L) 17.9 - 39.5 %   UIBC 318 ug/dL    Comment: Performed at [facility], [address] [place], [place] [address]  CBC with Differential     Status: Abnormal   Collection Time: 12/20/19 11:18 AM  Result Value Ref Range   WBC 6.2 4.0 - 10.5 K/uL   RBC 4.13 (L) 4.22 - 5.81 MIL/uL   Hemoglobin 10.6 (L) 13.0 - 17.0 g/dL   HCT 36.0 (L) 39.0 - 52.0 %   MCV 87.2 80.0 - 100.0 fL   MCH 25.7 (L) 26.0 - 34.0 pg   MCHC 29.4 (L) 30.0 - 36.0 g/dL   RDW 18.9 (H) 11.5 - 15.5 %   Platelets 222 150 - 400 K/uL   nRBC 0.0 0.0 - 0.2 %   Neutrophils Relative % 71 %   Neutro Abs 4.4 1.7 - 7.7  K/uL   Lymphocytes Relative 19 %   Lymphs Abs 1.2 0.7 - 4.0 K/uL   Monocytes Relative 7 %   Monocytes Absolute 0.4 0.1 - 1.0  K/uL   Eosinophils Relative 3 %   Eosinophils Absolute 0.2 0.0 - 0.5 K/uL   Basophils Relative 0 %   Basophils Absolute 0.0 0.0 - 0.1 K/uL   Immature Granulocytes 0 %   Abs Immature Granulocytes 0.01 0.00 - 0.07 K/uL    Comment: Performed at [facility], [address] [place], [place] [address]  Ferritin     Status: Abnormal   Collection Time: 12/20/19 11:18 AM  Result Value Ref Range   Ferritin 9 (L) 24 - 336 ng/mL    Comment: Performed at [facility], [address], [place], [place] [address]    Radiology DG Chest 2 View  Result Date: 11/22/2019 CLINICAL DATA:  Left-sided chest pain for 1 week.  Dyspnea. EXAM: CHEST - 2 VIEW COMPARISON:  07/07/2019 FINDINGS: The heart size and mediastinal contours are within normal limits. Low lung volumes again noted. Both lungs are clear. The visualized skeletal structures are unremarkable. IMPRESSION: No active cardiopulmonary disease. Electronically Signed   By: Marlaine Hind M.D.   On: 11/22/2019 15:30    Assessment/Plan Essential hypertension blood pressure control important in reducing the progression of atherosclerotic disease. On appropriate oral medications.   Diabetes ([place]) blood glucose control important in reducing the progression of atherosclerotic disease. Also, involved in wound healing. On appropriate medications.  Chronic kidney disease, unspecified With his swelling worsening and him being more short of breath, I am concerned his kidney function could have declined some.  He sees his nephrologist regularly and I will reach out to him to get his opinion as well  Bilateral lower extremity edema Swelling is really not much better at this point.  We have been doing Unna boots and he is in compression socks today.  He does not really want to go back into the Unna boots and they are not  doing a whole heck of a lot of good.  I am concerned something more systemic such as heart failure or worsening renal function may be playing a role in why his swelling is no refractory.  We are going to continue compression stockings for now unless he has skin breakdown and then go back and Unna boots.    Leotis Pain, MD  12/20/2019 2:16 PM    This note was created with Dragon medical transcription system.  Any errors from dictation are purely unintentional

## 2019-12-20 NOTE — Assessment & Plan Note (Signed)
Swelling is really not much better at this point.  We have been doing Unna boots and he is in compression socks today.  He does not really want to go back into the Unna boots and they are not doing a whole heck of a lot of good.  I am concerned something more systemic such as heart failure or worsening renal function may be playing a role in why his swelling is no refractory.  We are going to continue compression stockings for now unless he has skin breakdown and then go back and Unna boots.

## 2020-01-09 ENCOUNTER — Encounter: Payer: Self-pay | Admitting: Student in an Organized Health Care Education/Training Program

## 2020-01-10 ENCOUNTER — Emergency Department: Payer: Medicare Other

## 2020-01-10 ENCOUNTER — Ambulatory Visit
Payer: Medicare Other | Attending: Student in an Organized Health Care Education/Training Program | Admitting: Student in an Organized Health Care Education/Training Program

## 2020-01-10 ENCOUNTER — Inpatient Hospital Stay
Admission: EM | Admit: 2020-01-10 | Discharge: 2020-01-13 | DRG: 603 | Disposition: A | Discharge status: Home / Self Care | Payer: Medicare Other | Source: Ambulatory Visit | Attending: Family Medicine | Admitting: Family Medicine

## 2020-01-10 ENCOUNTER — Telehealth: Payer: Self-pay

## 2020-01-10 ENCOUNTER — Other Ambulatory Visit: Payer: Self-pay

## 2020-01-10 DIAGNOSIS — Z79899 Other long term (current) drug therapy: Secondary | ICD-10-CM

## 2020-01-10 DIAGNOSIS — M5136 Other intervertebral disc degeneration, lumbar region: Secondary | ICD-10-CM | POA: Diagnosis not present

## 2020-01-10 DIAGNOSIS — G8929 Other chronic pain: Secondary | ICD-10-CM | POA: Diagnosis present

## 2020-01-10 DIAGNOSIS — M5416 Radiculopathy, lumbar region: Secondary | ICD-10-CM

## 2020-01-10 DIAGNOSIS — Z85528 Personal history of other malignant neoplasm of kidney: Secondary | ICD-10-CM | POA: Diagnosis not present

## 2020-01-10 DIAGNOSIS — M545 Low back pain: Secondary | ICD-10-CM | POA: Diagnosis present

## 2020-01-10 DIAGNOSIS — L97929 Non-pressure chronic ulcer of unspecified part of left lower leg with unspecified severity: Secondary | ICD-10-CM | POA: Diagnosis present

## 2020-01-10 DIAGNOSIS — I129 Hypertensive chronic kidney disease with stage 1 through stage 4 chronic kidney disease, or unspecified chronic kidney disease: Secondary | ICD-10-CM | POA: Diagnosis present

## 2020-01-10 DIAGNOSIS — E785 Hyperlipidemia, unspecified: Secondary | ICD-10-CM | POA: Diagnosis present

## 2020-01-10 DIAGNOSIS — I872 Venous insufficiency (chronic) (peripheral): Secondary | ICD-10-CM | POA: Diagnosis present

## 2020-01-10 DIAGNOSIS — Z9884 Bariatric surgery status: Secondary | ICD-10-CM

## 2020-01-10 DIAGNOSIS — D631 Anemia in chronic kidney disease: Secondary | ICD-10-CM | POA: Diagnosis present

## 2020-01-10 DIAGNOSIS — I83893 Varicose veins of bilateral lower extremities with other complications: Secondary | ICD-10-CM

## 2020-01-10 DIAGNOSIS — Z833 Family history of diabetes mellitus: Secondary | ICD-10-CM

## 2020-01-10 DIAGNOSIS — M48062 Spinal stenosis, lumbar region with neurogenic claudication: Secondary | ICD-10-CM | POA: Diagnosis not present

## 2020-01-10 DIAGNOSIS — E1122 Type 2 diabetes mellitus with diabetic chronic kidney disease: Secondary | ICD-10-CM | POA: Diagnosis present

## 2020-01-10 DIAGNOSIS — F329 Major depressive disorder, single episode, unspecified: Secondary | ICD-10-CM | POA: Diagnosis present

## 2020-01-10 DIAGNOSIS — R600 Localized edema: Secondary | ICD-10-CM | POA: Diagnosis present

## 2020-01-10 DIAGNOSIS — Z20822 Contact with and (suspected) exposure to covid-19: Secondary | ICD-10-CM | POA: Diagnosis present

## 2020-01-10 DIAGNOSIS — Z8249 Family history of ischemic heart disease and other diseases of the circulatory system: Secondary | ICD-10-CM

## 2020-01-10 DIAGNOSIS — Z8711 Personal history of peptic ulcer disease: Secondary | ICD-10-CM

## 2020-01-10 DIAGNOSIS — E1151 Type 2 diabetes mellitus with diabetic peripheral angiopathy without gangrene: Secondary | ICD-10-CM | POA: Diagnosis present

## 2020-01-10 DIAGNOSIS — M9973 Connective tissue and disc stenosis of intervertebral foramina of lumbar region: Secondary | ICD-10-CM

## 2020-01-10 DIAGNOSIS — M51369 Other intervertebral disc degeneration, lumbar region without mention of lumbar back pain or lower extremity pain: Secondary | ICD-10-CM

## 2020-01-10 DIAGNOSIS — L03116 Cellulitis of left lower limb: Secondary | ICD-10-CM

## 2020-01-10 DIAGNOSIS — Z981 Arthrodesis status: Secondary | ICD-10-CM | POA: Diagnosis not present

## 2020-01-10 DIAGNOSIS — J449 Chronic obstructive pulmonary disease, unspecified: Secondary | ICD-10-CM | POA: Diagnosis present

## 2020-01-10 DIAGNOSIS — F419 Anxiety disorder, unspecified: Secondary | ICD-10-CM | POA: Diagnosis present

## 2020-01-10 DIAGNOSIS — N1831 Chronic kidney disease, stage 3a: Secondary | ICD-10-CM

## 2020-01-10 DIAGNOSIS — D509 Iron deficiency anemia, unspecified: Secondary | ICD-10-CM | POA: Diagnosis present

## 2020-01-10 DIAGNOSIS — I10 Essential (primary) hypertension: Secondary | ICD-10-CM | POA: Diagnosis not present

## 2020-01-10 DIAGNOSIS — M48061 Spinal stenosis, lumbar region without neurogenic claudication: Secondary | ICD-10-CM

## 2020-01-10 DIAGNOSIS — Z86718 Personal history of other venous thrombosis and embolism: Secondary | ICD-10-CM | POA: Diagnosis not present

## 2020-01-10 DIAGNOSIS — M4326 Fusion of spine, lumbar region: Secondary | ICD-10-CM

## 2020-01-10 DIAGNOSIS — Z905 Acquired absence of kidney: Secondary | ICD-10-CM

## 2020-01-10 DIAGNOSIS — G894 Chronic pain syndrome: Secondary | ICD-10-CM

## 2020-01-10 DIAGNOSIS — Z7901 Long term (current) use of anticoagulants: Secondary | ICD-10-CM | POA: Diagnosis not present

## 2020-01-10 DIAGNOSIS — E1169 Type 2 diabetes mellitus with other specified complication: Secondary | ICD-10-CM | POA: Diagnosis not present

## 2020-01-10 DIAGNOSIS — N1832 Chronic kidney disease, stage 3b: Secondary | ICD-10-CM | POA: Diagnosis present

## 2020-01-10 DIAGNOSIS — L0390 Cellulitis, unspecified: Secondary | ICD-10-CM | POA: Diagnosis present

## 2020-01-10 DIAGNOSIS — L03818 Cellulitis of other sites: Secondary | ICD-10-CM

## 2020-01-10 DIAGNOSIS — Z7984 Long term (current) use of oral hypoglycemic drugs: Secondary | ICD-10-CM

## 2020-01-10 DIAGNOSIS — Z87891 Personal history of nicotine dependence: Secondary | ICD-10-CM | POA: Diagnosis not present

## 2020-01-10 LAB — COMPREHENSIVE METABOLIC PANEL
ALT: 25 U/L (ref 0–44)
AST: 20 U/L (ref 15–41)
Albumin: 3.2 g/dL — ABNORMAL LOW (ref 3.5–5.0)
Alkaline Phosphatase: 148 U/L — ABNORMAL HIGH (ref 38–126)
Anion gap: 7 (ref 5–15)
BUN: 30 mg/dL — ABNORMAL HIGH (ref 8–23)
CO2: 18 mmol/L — ABNORMAL LOW (ref 22–32)
Calcium: 8.7 mg/dL — ABNORMAL LOW (ref 8.9–10.3)
Chloride: 114 mmol/L — ABNORMAL HIGH (ref 98–111)
Creatinine, Ser: 1.59 mg/dL — ABNORMAL HIGH (ref 0.61–1.24)
GFR calc Af Amer: 50 mL/min — ABNORMAL LOW (ref >60)
GFR calc non Af Amer: 43 mL/min — ABNORMAL LOW (ref >60)
Glucose, Bld: 163 mg/dL — ABNORMAL HIGH (ref 70–99)
Potassium: 4.6 mmol/L (ref 3.5–5.1)
Sodium: 139 mmol/L (ref 135–145)
Total Bilirubin: 0.4 mg/dL (ref 0.3–1.2)
Total Protein: 6.3 g/dL — ABNORMAL LOW (ref 6.5–8.1)

## 2020-01-10 LAB — GLUCOSE, CAPILLARY
Glucose-Capillary: 143 mg/dL — ABNORMAL HIGH (ref 70–99)
Glucose-Capillary: 242 mg/dL — ABNORMAL HIGH (ref 70–99)

## 2020-01-10 LAB — CBC WITH DIFFERENTIAL/PLATELET
Abs Immature Granulocytes: 0.02 10*3/uL (ref 0.00–0.07)
Basophils Absolute: 0.1 10*3/uL (ref 0.0–0.1)
Basophils Relative: 1 %
Eosinophils Absolute: 0.2 10*3/uL (ref 0.0–0.5)
Eosinophils Relative: 2 %
HCT: 32.9 % — ABNORMAL LOW (ref 39.0–52.0)
Hemoglobin: 9.8 g/dL — ABNORMAL LOW (ref 13.0–17.0)
Immature Granulocytes: 0 %
Lymphocytes Relative: 18 %
Lymphs Abs: 1.3 10*3/uL (ref 0.7–4.0)
MCH: 26.6 pg (ref 26.0–34.0)
MCHC: 29.8 g/dL — ABNORMAL LOW (ref 30.0–36.0)
MCV: 89.4 fL (ref 80.0–100.0)
Monocytes Absolute: 0.5 10*3/uL (ref 0.1–1.0)
Monocytes Relative: 6 %
Neutro Abs: 5.4 10*3/uL (ref 1.7–7.7)
Neutrophils Relative %: 73 %
Platelets: 239 10*3/uL (ref 150–400)
RBC: 3.68 MIL/uL — ABNORMAL LOW (ref 4.22–5.81)
RDW: 17.7 % — ABNORMAL HIGH (ref 11.5–15.5)
WBC: 7.4 10*3/uL (ref 4.0–10.5)
nRBC: 0 % (ref 0.0–0.2)

## 2020-01-10 LAB — LACTIC ACID, PLASMA: Lactic Acid, Venous: 0.6 mmol/L (ref 0.5–1.9)

## 2020-01-10 LAB — HEMOGLOBIN A1C
Hgb A1c MFr Bld: 7.2 % — ABNORMAL HIGH (ref 4.8–5.6)
Mean Plasma Glucose: 159.94 mg/dL

## 2020-01-10 MED ORDER — SODIUM CHLORIDE 0.9 % IV SOLN
1.0000 g | INTRAVENOUS | Status: DC
  Administered 2020-01-11 – 2020-01-12 (×2): 1 g via INTRAVENOUS
  Filled 2020-01-10: qty 1
  Filled 2020-01-10: qty 10
  Filled 2020-01-10: qty 1

## 2020-01-10 MED ORDER — SENNOSIDES-DOCUSATE SODIUM 8.6-50 MG PO TABS: 1.0000 | ORAL_TABLET | Freq: Every evening | ORAL | Status: DC | PRN

## 2020-01-10 MED ORDER — FUROSEMIDE 40 MG PO TABS: 40.0000 mg | ORAL_TABLET | Freq: Two times a day (BID) | ORAL | Status: DC

## 2020-01-10 MED ORDER — VANCOMYCIN HCL IN DEXTROSE 1-5 GM/200ML-% IV SOLN
1000.0000 mg | Freq: Once | INTRAVENOUS | Status: AC
  Administered 2020-01-10: 1000 mg via INTRAVENOUS
  Filled 2020-01-10: qty 200

## 2020-01-10 MED ORDER — PANTOPRAZOLE SODIUM 40 MG PO TBEC
40.0000 mg | DELAYED_RELEASE_TABLET | Freq: Two times a day (BID) | ORAL | Status: DC
  Administered 2020-01-10 – 2020-01-13 (×6): 40 mg via ORAL
  Filled 2020-01-10 (×6): qty 1

## 2020-01-10 MED ORDER — SUCRALFATE 1 G PO TABS
1.0000 g | ORAL_TABLET | Freq: Three times a day (TID) | ORAL | Status: DC
  Administered 2020-01-10 – 2020-01-13 (×9): 1 g via ORAL
  Filled 2020-01-10 (×9): qty 1

## 2020-01-10 MED ORDER — FUROSEMIDE 10 MG/ML IJ SOLN
4.0000 mg/h | INTRAVENOUS | Status: DC
  Administered 2020-01-11: 4 mg/h via INTRAVENOUS
  Filled 2020-01-10: qty 25

## 2020-01-10 MED ORDER — SODIUM CHLORIDE 0.9 % IV SOLN
1.0000 g | Freq: Once | INTRAVENOUS | Status: AC
  Administered 2020-01-10: 15:00:00 1 g via INTRAVENOUS
  Filled 2020-01-10: qty 10

## 2020-01-10 MED ORDER — ONDANSETRON HCL 4 MG PO TABS: 4.0000 mg | ORAL_TABLET | Freq: Four times a day (QID) | ORAL | Status: DC | PRN

## 2020-01-10 MED ORDER — OXYCODONE-ACETAMINOPHEN 5-325 MG PO TABS
1.0000 | ORAL_TABLET | Freq: Three times a day (TID) | ORAL | Status: DC | PRN
  Administered 2020-01-11 – 2020-01-12 (×4): 1 via ORAL
  Filled 2020-01-10 (×4): qty 1

## 2020-01-10 MED ORDER — CARVEDILOL 3.125 MG PO TABS
6.2500 mg | ORAL_TABLET | Freq: Two times a day (BID) | ORAL | Status: DC
  Administered 2020-01-10 – 2020-01-13 (×6): 6.25 mg via ORAL
  Filled 2020-01-10 (×6): qty 2

## 2020-01-10 MED ORDER — UMECLIDINIUM-VILANTEROL 62.5-25 MCG/INH IN AEPB
1.0000 | INHALATION_SPRAY | Freq: Every day | RESPIRATORY_TRACT | Status: DC
  Administered 2020-01-11 – 2020-01-13 (×3): 1 via RESPIRATORY_TRACT
  Filled 2020-01-10: qty 14

## 2020-01-10 MED ORDER — FUROSEMIDE 10 MG/ML IJ SOLN
60.0000 mg | Freq: Once | INTRAMUSCULAR | Status: AC
  Administered 2020-01-10: 60 mg via INTRAVENOUS
  Filled 2020-01-10: qty 8

## 2020-01-10 MED ORDER — TIZANIDINE HCL 4 MG PO TABS
4.0000 mg | ORAL_TABLET | Freq: Three times a day (TID) | ORAL | Status: DC | PRN
  Filled 2020-01-10: qty 1

## 2020-01-10 MED ORDER — PRAMIPEXOLE DIHYDROCHLORIDE 1 MG PO TABS
1.0000 mg | ORAL_TABLET | Freq: Two times a day (BID) | ORAL | Status: DC
  Administered 2020-01-10 – 2020-01-13 (×6): 1 mg via ORAL
  Filled 2020-01-10 (×7): qty 1

## 2020-01-10 MED ORDER — OXYCODONE-ACETAMINOPHEN 10-325 MG PO TABS: 1.0000 | ORAL_TABLET | Freq: Three times a day (TID) | ORAL | 0 refills | Status: DC | PRN

## 2020-01-10 MED ORDER — ADULT MULTIVITAMIN W/MINERALS CH
1.0000 | ORAL_TABLET | Freq: Every day | ORAL | Status: DC
  Administered 2020-01-11 – 2020-01-13 (×3): 1 via ORAL
  Filled 2020-01-10 (×3): qty 1

## 2020-01-10 MED ORDER — ATORVASTATIN CALCIUM 10 MG PO TABS
10.0000 mg | ORAL_TABLET | Freq: Every day | ORAL | Status: DC
  Administered 2020-01-10 – 2020-01-12 (×3): 10 mg via ORAL
  Filled 2020-01-10 (×3): qty 1

## 2020-01-10 MED ORDER — INSULIN ASPART 100 UNIT/ML ~~LOC~~ SOLN
0.0000 [IU] | Freq: Every day | SUBCUTANEOUS | Status: DC
  Administered 2020-01-10 – 2020-01-12 (×3): 2 [IU] via SUBCUTANEOUS
  Filled 2020-01-10 (×3): qty 1

## 2020-01-10 MED ORDER — FLUTICASONE PROPIONATE 50 MCG/ACT NA SUSP
1.0000 | Freq: Every day | NASAL | Status: DC
  Administered 2020-01-11 – 2020-01-13 (×3): 1 via NASAL
  Filled 2020-01-10: qty 16

## 2020-01-10 MED ORDER — ONDANSETRON HCL 4 MG/2ML IJ SOLN: 4.0000 mg | Freq: Four times a day (QID) | INTRAMUSCULAR | Status: DC | PRN

## 2020-01-10 MED ORDER — INSULIN ASPART 100 UNIT/ML ~~LOC~~ SOLN
0.0000 [IU] | Freq: Three times a day (TID) | SUBCUTANEOUS | Status: DC
  Administered 2020-01-10: 2 [IU] via SUBCUTANEOUS
  Administered 2020-01-11: 3 [IU] via SUBCUTANEOUS
  Administered 2020-01-11: 5 [IU] via SUBCUTANEOUS
  Administered 2020-01-12 (×3): 3 [IU] via SUBCUTANEOUS
  Filled 2020-01-10 (×8): qty 1

## 2020-01-10 MED ORDER — VANCOMYCIN HCL 1500 MG/300ML IV SOLN
1500.0000 mg | Freq: Once | INTRAVENOUS | Status: AC
  Administered 2020-01-10: 17:00:00 1500 mg via INTRAVENOUS
  Filled 2020-01-10 (×2): qty 300

## 2020-01-10 MED ORDER — APIXABAN 5 MG PO TABS
5.0000 mg | ORAL_TABLET | Freq: Two times a day (BID) | ORAL | Status: DC
  Administered 2020-01-10 – 2020-01-13 (×6): 5 mg via ORAL
  Filled 2020-01-10 (×6): qty 1

## 2020-01-10 MED ORDER — INSULIN DETEMIR 100 UNIT/ML ~~LOC~~ SOLN
5.0000 [IU] | Freq: Every day | SUBCUTANEOUS | Status: DC
  Administered 2020-01-10: 22:00:00 5 [IU] via SUBCUTANEOUS
  Filled 2020-01-10 (×3): qty 0.05

## 2020-01-10 MED ORDER — PAROXETINE HCL 20 MG PO TABS
40.0000 mg | ORAL_TABLET | ORAL | Status: DC
  Administered 2020-01-11: 40 mg via ORAL
  Filled 2020-01-10 (×3): qty 2

## 2020-01-10 MED ORDER — DULOXETINE HCL 60 MG PO CPEP
120.0000 mg | ORAL_CAPSULE | Freq: Every day | ORAL | Status: DC
  Administered 2020-01-10 – 2020-01-13 (×4): 120 mg via ORAL
  Filled 2020-01-10 (×5): qty 2

## 2020-01-10 MED ORDER — VANCOMYCIN HCL 1250 MG/250ML IV SOLN: 1250.0000 mg | INTRAVENOUS | Status: DC

## 2020-01-10 NOTE — H&P (Addendum)
History and Physical    [name] NOM:[id number] DOB: January 03, 1946 DOA: 01/10/2020  PCP: Curtis Late, MD  Patient coming from: Home  I have personally briefly reviewed patient's old medical records in [place]  Chief Complaint: Left lower extremity cellulitis  HPI: [name] is a 74 y.o. male with medical history significant of chronic back pain, COPD, diabetes mellitus, chronic kidney disease stage III who presents for evaluation from the nephrologist office for evaluation and management of acute on chronic lower extremity swelling with associated left lower extremity cellulitis.  The patient had apparently been on 7 days of an oral antibiotic, likely amoxicillin/clavulanic acid, and had failed this therapy.  As a result he was directed to the emergency department today.  The patient has had issues with chronic lower extremity edema for years.  His Lasix dose was recently increased however cellulitis has continued to progress on the left leg.  Patient denies fevers or chills at home however states that he has a fairly constant burning sensation in the left lower extremity.  He also endorses that he has had chronic lower back issues and sees a pain management doctor for this however he has been unable to effectively rehabilitate his back due to the excess fluid and resultant weight of his lower extremities.  ED Course: Initial laboratory survey overall within normal limits.  Patient is afebrile with vital signs within normal limits.  His creatinine is 1.59 which appears at or near baseline.  Patient also has some anemia however this appears chronic as well.  Considering the severity of the left lower extremity cellulitis as well as the presence of failed outpatient therapy the patient received broad-spectrum antibiotic therapy with vancomycin and Rocephin.  Patient is not septic so no initial blood cultures were drawn.  Patient is on Eliquis for history of VTE  however bilateral lower extremity duplex is negative for acute DVT  Given the severity of his presentation internal medicine was called and accepted the patient for inpatient admission.  Review of Systems: As per HPI otherwise 10 point review of systems negative.   Past Medical History:  Diagnosis Date  . Chronic back pain   . COPD (chronic obstructive pulmonary disease) ([place])   . Depression   . Diabetes mellitus without complication ([place])   . Hyperlipidemia   . Hypertension   . Renal cell carcinoma ([place])   . Sleep apnea     Past Surgical History:  Procedure Laterality Date  . CARPAL TUNNEL RELEASE    . CHOLECYSTECTOMY    . COLONOSCOPY WITH PROPOFOL N/A 07/29/2017   Procedure: COLONOSCOPY WITH PROPOFOL;  Surgeon: Manya Silvas, MD;  Location: [facility] ENDOSCOPY;  Service: Endoscopy;  Laterality: N/A;  . colonoscopys    . ESOPHAGOGASTRODUODENOSCOPY (EGD) WITH PROPOFOL N/A 07/29/2017   Procedure: ESOPHAGOGASTRODUODENOSCOPY (EGD) WITH PROPOFOL;  Surgeon: Manya Silvas, MD;  Location: [facility] ENDOSCOPY;  Service: Endoscopy;  Laterality: N/A;  . FLEXIBLE SIGMOIDOSCOPY    . GASTRIC BYPASS    . NEPHRECTOMY    . open reduction and internal fixation, right distal radius    . right knee arthroscopy    . UPPER GI ENDOSCOPY       reports that he has quit smoking. He has a 30.00 pack-year smoking history. His smokeless tobacco use includes chew. He reports that he does not drink alcohol or use drugs.  No Known Allergies  Family History  Problem Relation Age of Onset  . Diabetes Mother   . Cancer  Mother   . Heart disease Father     Prior to Admission medications   Medication Sig Start Date End Date Taking? Authorizing Provider  ANORO ELLIPTA 62.5-25 MCG/INH AEPB Inhale 1 puff into the lungs daily.  05/18/19   [provider]  apixaban (ELIQUIS) 5 MG TABS tablet Take 5 mg by mouth every 12 (twelve) hours.     [provider]  atorvastatin (LIPITOR) 10 MG tablet Take  10 mg by mouth daily.    [provider]  carvedilol (COREG) 6.25 MG tablet Take 6.25 mg by mouth 2 (two) times daily with a meal.    [provider]  doxycycline (VIBRAMYCIN) 100 MG capsule Take 1 capsule (100 mg total) by mouth daily. 10/24/19   Kris Hartmann, NP  DULoxetine (CYMBALTA) 60 MG capsule 120 mg daily.  09/01/19   [provider]  fluticasone (FLONASE) 50 MCG/ACT nasal spray Place 1 spray into both nostrils 2 (two) times a day.    [provider]  furosemide (LASIX) 40 MG tablet Take 1 tablet (40 mg total) by mouth daily. Patient taking differently: Take 40 mg by mouth 2 (two) times daily.  07/08/19   Loletha Grayer, MD  glipiZIDE (GLUCOTROL XL) 2.5 MG 24 hr tablet Take 1 tablet (2.5 mg total) by mouth daily with breakfast. 07/08/19   Loletha Grayer, MD  Multiple Vitamin (MULTIVITAMIN) tablet Take 1 tablet by mouth daily.    [provider]  oxyCODONE-acetaminophen (PERCOCET) 10-325 MG tablet Take 1 tablet by mouth every 8 (eight) hours as needed for pain. Must last 30 days. 01/10/20 02/09/20  Gillis Santa, MD  oxyCODONE-acetaminophen (PERCOCET) 10-325 MG tablet Take 1 tablet by mouth every 8 (eight) hours as needed for pain. Must last 30 days. 02/09/20 03/10/20  Gillis Santa, MD  oxyCODONE-acetaminophen (PERCOCET) 10-325 MG tablet Take 1 tablet by mouth every 8 (eight) hours as needed for pain. Must last 30 days. 03/10/20 04/09/20  Gillis Santa, MD  pantoprazole (PROTONIX) 40 MG tablet Take 40 mg by mouth 2 (two) times daily.     [provider]  PARoxetine (PAXIL) 40 MG tablet Take 40 mg by mouth every morning.     [provider]  potassium chloride (KLOR-CON) 20 MEQ packet Take by mouth daily.    [provider]  pramipexole (MIRAPEX) 1 MG tablet Take 1 mg by mouth 2 (two) times a day.  05/11/19   [provider]  sodium zirconium cyclosilicate (LOKELMA) 10 g PACK packet Take 10 g by mouth 2 (two) times  daily. 07/08/19   Loletha Grayer, MD  sucralfate (CARAFATE) 1 g tablet Take 1 g by mouth 3 (three) times daily.     [provider]  tiZANidine (ZANAFLEX) 4 MG tablet Take 4 mg by mouth every 8 (eight) hours as needed for muscle spasms.    [provider]    Physical Exam: Vitals:   01/10/20 1243 01/10/20 1244  BP: (!) 142/73   Pulse: 90   Resp: 16   Temp: 98.4 F (36.9 C)   TempSrc: Oral   SpO2: 98%   Weight:  121 kg  Height:  6' (1.829 m)    Constitutional: NAD, calm, comfortable Vitals:   01/10/20 1243 01/10/20 1244  BP: (!) 142/73   Pulse: 90   Resp: 16   Temp: 98.4 F (36.9 C)   TempSrc: Oral   SpO2: 98%   Weight:  121 kg  Height:  6' (1.829 m)   Eyes:  PERRL, lids and conjunctivae normal ENMT: Mucous membranes are moist. Posterior pharynx clear of any exudate or lesions.Normal dentition.  Neck: normal, supple, no masses, no thyromegaly Respiratory: clear to auscultation bilaterally, no wheezing, no crackles. Normal respiratory effort. No accessory muscle use.  Cardiovascular: Regular rate and rhythm, no murmurs / rubs / gallops.  Extremities: Severe bilateral lower extremity edema, left lower extremity erythematous in appearance and painful to touch Abdomen: no tenderness, no masses palpated. No hepatosplenomegaly. Bowel sounds positive.  Musculoskeletal: no clubbing / cyanosis. No joint deformity upper and lower extremities. Good ROM, no contractures. Normal muscle tone.  Skin: no rashes, lesions, ulcers. No induration Neurologic: CN 2-12 grossly intact. Sensation intact, DTR normal. Strength 5/5 in all 4.  Psychiatric: Normal judgment and insight. Alert and oriented x 3. Normal mood.    Labs on Admission: I have personally reviewed following labs and imaging studies  CBC: Recent Labs  Lab 01/10/20 1245  WBC 7.4  NEUTROABS 5.4  HGB 9.8*  HCT 32.9*  MCV 89.4  PLT 268   Basic Metabolic Panel: Recent Labs  Lab 01/10/20 1245  NA 139   K 4.6  CL 114*  CO2 18*  GLUCOSE 163*  BUN 30*  CREATININE 1.59*  CALCIUM 8.7*   GFR: Estimated Creatinine Clearance: 57.3 mL/min (A) (by C-G formula based on SCr of 1.59 mg/dL (H)). Liver Function Tests: Recent Labs  Lab 01/10/20 1245  AST 20  ALT 25  ALKPHOS 148*  BILITOT 0.4  PROT 6.3*  ALBUMIN 3.2*   No results for input(s): LIPASE, AMYLASE in the last 168 hours. No results for input(s): AMMONIA in the last 168 hours. Coagulation Profile: No results for input(s): INR, PROTIME in the last 168 hours. Cardiac Enzymes: No results for input(s): CKTOTAL, CKMB, CKMBINDEX, TROPONINI in the last 168 hours. BNP (last 3 results) No results for input(s): PROBNP in the last 8760 hours. HbA1C: No results for input(s): HGBA1C in the last 72 hours. CBG: No results for input(s): GLUCAP in the last 168 hours. Lipid Profile: No results for input(s): CHOL, HDL, LDLCALC, TRIG, CHOLHDL, LDLDIRECT in the last 72 hours. Thyroid Function Tests: No results for input(s): TSH, T4TOTAL, FREET4, T3FREE, THYROIDAB in the last 72 hours. Anemia Panel: No results for input(s): VITAMINB12, FOLATE, FERRITIN, TIBC, IRON, RETICCTPCT in the last 72 hours. Urine analysis:    Component Value Date/Time   COLORURINE STRAW (A) 01/24/2019 1250   APPEARANCEUR CLEAR (A) 01/24/2019 1250   APPEARANCEUR Clear 06/22/2014 2019   LABSPEC 1.010 01/24/2019 1250   LABSPEC 1.015 06/22/2014 2019   PHURINE 5.0 01/24/2019 1250   GLUCOSEU NEGATIVE 01/24/2019 1250   GLUCOSEU Negative 06/22/2014 2019   HGBUR SMALL (A) 01/24/2019 1250   BILIRUBINUR NEGATIVE 01/24/2019 1250   BILIRUBINUR Negative 06/22/2014 2019   KETONESUR NEGATIVE 01/24/2019 1250   PROTEINUR 30 (A) 01/24/2019 1250   NITRITE NEGATIVE 01/24/2019 1250   LEUKOCYTESUR NEGATIVE 01/24/2019 1250   LEUKOCYTESUR Negative 06/22/2014 2019    Radiological Exams on Admission: No results found.  EKG: Not done  Assessment/Plan Active Problems:    Cellulitis    Left lower extremity cellulitis Appears to be secondary to refractory lower extremity edema Ultrasound negative for acute DVT No obvious abscess or purulence noted on my exam Received 60 IV Lasix in ED Plan: Admit inpatient Empiric IV antibiotics IV vancomycin, pharmacy to dose.  This medication can likely be discontinued within day or 2 if patient continues to improve IV Rocephin 1 g daily Continue Lasix 40  mg p.o. twice daily per home dose WOC consult Pain control as needed  Chronic kidney disease stage IIIa Creatinine appears at or near baseline Lasix as above Avoid further nephrotoxins if possible Daily renal function Nephrology consult, communicated with Dr. Holley Raring  Hyperlipidemia Continue home atorvastatin  Hypertension Continue home Coreg Lasix as above  Diabetes mellitus Hold oral agents Lantus 5 units daily Moderate sliding scale Titrate insulin regimen as needed Carb controlled diet  History of DVT Continue home Eliquis  Chronic back pain Followed by pain management Continue home outpatient pain regimen  Depression/anxiety Continue home Paxil Continue home Cymbalta  COPD Not in acute exacerbation Continue home fluticasone Patient substitution for Anoro Ellipta   DVT prophylaxis: Eliquis Code Status: Full Family Communication: None today  disposition Plan: Anticipate home Consults called: Nephrology- [place] Admission status: Inpatient   Sidney Ace MD [facility] Pager [phone]  If 7PM-7AM, please contact night-coverage [email or web address] Password [facility]  01/10/2020, 3:04 PM

## 2020-01-10 NOTE — ED Triage Notes (Signed)
Reports cellulitis to left leg, just finished round of PO ABX for same. Redness and pain present to left lower leg. Referred to ER by his kidney doctor. Pt alert and oriented X4, cooperative, RR even and unlabored, color WNL. Pt in NAD.

## 2020-01-10 NOTE — Progress Notes (Signed)
Patient: [name]  Service Category: E/M  Provider: Gillis Santa, MD  DOB: 1946/07/29  DOS: 01/10/2020  Location: Office  MRN: [id number]  Setting: Ambulatory outpatient  Referring Provider: Derinda Late, MD  Type: Established Patient  Specialty: Interventional Pain Management  PCP: Derinda Late, MD  Location: Home  Delivery: TeleHealth     Virtual Encounter - Pain Management PROVIDER NOTE: Information contained herein reflects review and annotations entered in association with encounter. Interpretation of such information and data should be left to medically-trained personnel. Information provided to patient can be located elsewhere in the medical record under "Patient Instructions". Document created using STT-dictation technology, any transcriptional errors that may result from process are unintentional.    Contact & Pharmacy Preferred: [phone] Home: [phone] (home) Mobile: [phone] (mobile) E-mail: No e-mail address on record  [id number] [place], [place] [address] [place] [address] Phone: [phone] Fax: [phone]   Pre-screening  [name] offered "in-person" vs "virtual" encounter. He indicated preferring virtual for this encounter.   Reason COVID-19*  Social distancing based on CDC and AMA recommendations.   I contacted [name] on 01/10/2020 via telephone.      I clearly identified myself as Gillis Santa, MD. I verified that I was speaking with the correct person using two identifiers (Name: [name], and date of birth: [birth year]).   This visit was completed via telephone due to the restrictions of the COVID-19 pandemic. All issues as above were discussed and addressed but no physical exam was performed. If it was felt that the patient should be evaluated in the office, they were directed there. The patient verbally consented to this visit. Patient was unable to complete an  audio/visual visit due to Technical difficulties and/or Lack of internet. Due to the catastrophic nature of the COVID-19 pandemic, this visit was done through audio contact only.  Location of the patient: home address (see Epic for details)  Location of the provider: office Consent I sought verbal advanced consent from [name] for virtual visit interactions. I informed [name] of possible security and privacy concerns, risks, and limitations associated with providing "not-in-person" medical evaluation and management services. I also informed [name] of the availability of "in-person" appointments. Finally, I informed him that there would be a charge for the virtual visit and that he could be  personally, fully or partially, financially responsible for it. [name] expressed understanding and agreed to proceed.   Historic Elements   [name] is a 74 y.o. year old, male patient evaluated today after his last encounter by our practice on 11/01/2019. [name]  has a past medical history of Chronic back pain, COPD (chronic obstructive pulmonary disease) ([place]), Depression, Diabetes mellitus without complication ([place]), Hyperlipidemia, Hypertension, Renal cell carcinoma ([place]), and Sleep apnea. He also  has a past surgical history that includes colonoscopys; Upper gi endoscopy; Flexible sigmoidoscopy; Gastric bypass; Nephrectomy; open reduction and internal fixation, right distal radius; Carpal tunnel release; right knee arthroscopy; Esophagogastroduodenoscopy (egd) with propofol (N/A, 07/29/2017); Colonoscopy with propofol (N/A, 07/29/2017); and Cholecystectomy. [name] has a current medication list which includes the following prescription(s): anoro ellipta, apixaban, atorvastatin, carvedilol, doxycycline, duloxetine, fluticasone, furosemide, glipizide, multivitamin, oxycodone-acetaminophen, [START ON 02/09/2020] oxycodone-acetaminophen, [START ON 03/10/2020]  oxycodone-acetaminophen, pantoprazole, paroxetine, potassium chloride, pramipexole, sodium zirconium cyclosilicate, sucralfate, and tizanidine. He  reports that he has quit smoking. He has a 30.00 pack-year smoking history. His smokeless tobacco use includes chew. He reports  that he does not drink alcohol or use drugs. [name] has No Known Allergies.   HPI  Today, he is being contacted for medication management.   Is still going to [place] PT- has made progress but the swelling in his legs is impacting his back and leg pain Is supposed to go to ED today for antibiotic infusion for LE sores per Dr Anthonette Legato  Worsening low back pain and leg pain, making it very difficult to sleep tonight Dealing with RLS- on mirapex and cymbalta. Has tried Gabapentin in the past.  Pharmacotherapy Assessment  Analgesic: 12/01/2019  1   10/11/2019  Oxycodone-Acetaminophen 10-325  75.00  30 Bi Lat   [id number]   Nor (2541)   0  37.50 MME  Medicare   [place]   Monitoring:  Pharmacotherapy: No side-effects or adverse reactions reported. [place] PMP: PDMP reviewed during this encounter.       Compliance: No problems identified. Effectiveness: Clinically acceptable. Plan: Refer to "POC".  UDS: No results found for: SUMMARY Laboratory Chemistry Profile (12 mo)  Renal: 07/15/2019: BUN 42; Creatinine, Ser 1.77  Lab Results  Component Value Date   GFRAA 44 (L) 07/15/2019   GFRNONAA 38 (L) 07/15/2019   Hepatic: 07/05/2019: Albumin 3.8 Lab Results  Component Value Date   AST 27 07/05/2019   ALT 35 07/05/2019   Other: No results found for requested labs within last 8760 hours.  Note: Above Lab results reviewed.  Imaging  DG Chest 2 View CLINICAL DATA:  Left-sided chest pain for 1 week.  Dyspnea.  EXAM: CHEST - 2 VIEW  COMPARISON:  07/07/2019  FINDINGS: The heart size and mediastinal contours are within normal limits. Low lung volumes again noted. Both lungs are clear. The visualized skeletal  structures are unremarkable.  IMPRESSION: No active cardiopulmonary disease.  Electronically Signed   By: Marlaine Hind M.D.   On: 11/22/2019 15:30   Assessment  The primary encounter diagnosis was Lumbar radiculopathy. Diagnoses of Lumbar degenerative disc disease, Bilateral stenosis of lateral recess of lumbar spine (L3-L4), Ankylosis of lumbar spine, Neuroforaminal stenosis of lumbar spine (left, L5/S1), Spinal stenosis, lumbar region, with neurogenic claudication, and Chronic pain syndrome were also pertinent to this visit.  Plan of Care   I have discontinued [name]. [name]'s gabapentin and traMADol. I have also changed his oxyCODONE-acetaminophen. Additionally, I am having him start on oxyCODONE-acetaminophen and oxyCODONE-acetaminophen. Lastly, I am having him maintain his apixaban, carvedilol, multivitamin, pantoprazole, PARoxetine, sucralfate, pramipexole, Anoro Ellipta, atorvastatin, fluticasone, furosemide, glipiZIDE, sodium zirconium cyclosilicate, tiZANidine, DULoxetine, doxycycline, and potassium chloride.   Worsening lumbar spine pain due to lumbar degenerative disc disease, significant lumbar spinal canal stenosis and neuroforaminal stenosis at L5-S1 with associated neurogenic claudication.  Patient has been having worsening low back pain and left radiating leg pain as well as neck pain and bilateral radiating shoulder pain.  Has tried ESI, not effective.  He has been working with [facility] physical therapy but is limited in his progress by the swelling in his legs which is being managed by nephrology.  He is having a hard time dealing with his restless leg syndrome.  Patient has tried gabapentin in the past without much benefit.  He is on pramipexole as well as Cymbalta 120 mg.  He does find benefit with oxycodone and states that on some days he does have to take it 3 times daily.  We will increase his monthly quantity from 75-90 so that he can take 3 times daily as needed when  he  has severe breakthrough pain.  Hopefully diuresis and removal of swelling of his leg will improve his pain.  We will refill as below.  Patient instructed to limit and avoid any NSAIDs.  Continue Cymbalta as prescribed.  Continue tizanidine as needed for muscle spasms.    Pharmacotherapy (Medications Ordered): Meds ordered this encounter  Medications  . oxyCODONE-acetaminophen (PERCOCET) 10-325 MG tablet    Sig: Take 1 tablet by mouth every 8 (eight) hours as needed for pain. Must last 30 days.    Dispense:  90 tablet    Refill:  0    Chronic Pain. (STOP Act - Not applicable). Fill one day early if closed on scheduled refill date.  Marland Kitchen oxyCODONE-acetaminophen (PERCOCET) 10-325 MG tablet    Sig: Take 1 tablet by mouth every 8 (eight) hours as needed for pain. Must last 30 days.    Dispense:  90 tablet    Refill:  0    Chronic Pain. (STOP Act - Not applicable). Fill one day early if closed on scheduled refill date.  Marland Kitchen oxyCODONE-acetaminophen (PERCOCET) 10-325 MG tablet    Sig: Take 1 tablet by mouth every 8 (eight) hours as needed for pain. Must last 30 days.    Dispense:  90 tablet    Refill:  0    Chronic Pain. (STOP Act - Not applicable). Fill one day early if closed on scheduled refill date.   Orders:  No orders of the defined types were placed in this encounter.  Follow-up plan:   Return in about 3 months (around 04/09/2020) for Medication Management.    Recent Visits No visits were found meeting these conditions.  Showing recent visits within past 90 days and meeting all other requirements   Today's Visits Date Type Provider Dept  01/10/20 Office Visit Gillis Santa, MD [facility]  Showing today's visits and meeting all other requirements   Future Appointments No visits were found meeting these conditions.  Showing future appointments within next 90 days and meeting all other requirements   I discussed the assessment and treatment plan with the patient. The patient  was provided an opportunity to ask questions and all were answered. The patient agreed with the plan and demonstrated an understanding of the instructions.  Patient advised to call back or seek an in-person evaluation if the symptoms or condition worsens.  Duration of encounter: 30 minutes.  Note by: Gillis Santa, MD Date: 01/10/2020; Time: 11:31 AM

## 2020-01-10 NOTE — ED Notes (Signed)
Patient being transported to [place] at this time

## 2020-01-10 NOTE — Consult Note (Signed)
Pharmacy Antibiotic Note  [name] is a 74 y.o. male admitted on 01/10/2020 with cellulitis.  Pharmacy has been consulted for vancomycin dosing.  Plan: Pt will get vancomycin 2500 mg total loading dose in the ED. Will order 1250 mg q24H with a predicted AUC of 478. Goal AUC 400-550. Scr used 1.59. Plan to obtain vancomycin level in 4-5 days.   Height: 6' (182.9 cm) Weight: 266 lb 12.1 oz (121 kg) IBW/kg (Calculated) : 77.6  Temp (24hrs), Avg:98.4 F (36.9 C), Min:98.4 F (36.9 C), Max:98.4 F (36.9 C)  Recent Labs  Lab 01/10/20 1245  WBC 7.4  CREATININE 1.59*  LATICACIDVEN 0.6    Estimated Creatinine Clearance: 57.3 mL/min (A) (by C-G formula based on SCr of 1.59 mg/dL (H)).    No Known Allergies  Antimicrobials this admission: 1/26 vancomycin >>  1/26 ceftriaxone >>   Dose adjustments this admission: None  Thank you for allowing pharmacy to be a part of this patient's care.  Oswald Hillock, PharmD, BCPS 01/10/2020 3:34 PM

## 2020-01-10 NOTE — ED Notes (Signed)
Called Lab to add A1C.

## 2020-01-10 NOTE — Telephone Encounter (Signed)
Prescription for oxycodone #75 written on 10/11/2019 cancelled.  Spoke with Glennon Mac at [facility].

## 2020-01-10 NOTE — Consult Note (Signed)
PHARMACY -  BRIEF ANTIBIOTIC NOTE   Pharmacy has received consult(s) for cellulitis from an ED provider.  The patient's profile has been reviewed for ht/wt/allergies/indication/available labs.    One time order(s) placed for vancomycin and ceftriaxone   Further antibiotics/pharmacy consults should be ordered by admitting physician if indicated.                       Thank you, Oswald Hillock 01/10/2020  2:54 PM

## 2020-01-10 NOTE — Progress Notes (Signed)
[facility]  ROUNDING NOTE   Subjective:  Patient well-known to [place] as we follow him for chronic kidney disease stage IIIb, lower extremity edema, hypertension, and anemia of chronic kidney disease. He contacted our office yesterday for worsening lower extremity edema as well as increasing cellulitis in the left lower extremity. He was recently trialed on a course of oral antibiotics in the form of Augmentin. He states that initially this did improve however after he came off antibiotics symptoms came back.   Objective:  Vital signs in last 24 hours:  Temp:  [98.2 F (36.8 C)-98.4 F (36.9 C)] 98.2 F (36.8 C) (01/26 1648) Pulse Rate:  [79-90] 79 (01/26 1648) Resp:  [16-19] 17 (01/26 1648) BP: (121-153)/(73-80) 153/80 (01/26 1648) SpO2:  [94 %-98 %] 94 % (01/26 1648) Weight:  [121 kg] 121 kg (01/26 1244)  Weight change:  Filed Weights   01/10/20 1244  Weight: 121 kg    Intake/Output: I/O last 3 completed shifts: In: 465.1 [P.O.:240; IV Piggyback:225.1] Out: 1300 [Urine:1300]   Intake/Output this shift:  No intake/output data recorded.  Physical Exam: General: No acute distress  Head: Normocephalic, atraumatic. Moist oral mucosal membranes  Eyes: Anicteric  Neck: Supple, trachea midline  Lungs:  Clear to auscultation, normal effort  Heart: S1S2 no rubs  Abdomen:  Soft, nontender, bowel sounds present  Extremities:  3+ bilateral lower extremity edema with left lower extremity cellulitis and right lower extremity hyperemia  Neurologic: Awake, alert, following commands  Skin: Significant cellulitis in the left lower extremity with scaling skin and weeping of the skin noted.       Basic Metabolic Panel: Recent Labs  Lab 01/10/20 1245  NA 139  K 4.6  CL 114*  CO2 18*  GLUCOSE 163*  BUN 30*  CREATININE 1.59*  CALCIUM 8.7*    Liver Function Tests: Recent Labs  Lab 01/10/20 1245  AST 20  ALT 25  ALKPHOS 148*  BILITOT 0.4  PROT 6.3*  ALBUMIN  3.2*   No results for input(s): LIPASE, AMYLASE in the last 168 hours. No results for input(s): AMMONIA in the last 168 hours.  CBC: Recent Labs  Lab 01/10/20 1245  WBC 7.4  NEUTROABS 5.4  HGB 9.8*  HCT 32.9*  MCV 89.4  PLT 239    Cardiac Enzymes: No results for input(s): CKTOTAL, CKMB, CKMBINDEX, TROPONINI in the last 168 hours.  BNP: Invalid input(s): POCBNP  CBG: Recent Labs  Lab 01/10/20 1651  GLUCAP 143*    Microbiology: Results for orders placed or performed during the hospital encounter of 07/05/19  SARS Coronavirus 2 (CEPHEID - Performed in [place] hospital lab), Hosp Order     Status: None   Collection Time: 07/05/19  1:37 PM   Specimen: Nasopharyngeal Swab  Result Value Ref Range Status   SARS Coronavirus 2 NEGATIVE NEGATIVE Final    Comment: (NOTE) If result is NEGATIVE SARS-CoV-2 target nucleic acids are NOT DETECTED. The SARS-CoV-2 RNA is generally detectable in upper and lower  respiratory specimens during the acute phase of infection. The lowest  concentration of SARS-CoV-2 viral copies this assay can detect is 250  copies / mL. A negative result does not preclude SARS-CoV-2 infection  and should not be used as the sole basis for treatment or other  patient management decisions.  A negative result may occur with  improper specimen collection / handling, submission of specimen other  than nasopharyngeal swab, presence of viral mutation(s) within the  areas targeted by this  assay, and inadequate number of viral copies  (<250 copies / mL). A negative result must be combined with clinical  observations, patient history, and epidemiological information. If result is POSITIVE SARS-CoV-2 target nucleic acids are DETECTED. The SARS-CoV-2 RNA is generally detectable in upper and lower  respiratory specimens dur ing the acute phase of infection.  Positive  results are indicative of active infection with SARS-CoV-2.  Clinical  correlation with patient  history and other diagnostic information is  necessary to determine patient infection status.  Positive results do  not rule out bacterial infection or co-infection with other viruses. If result is PRESUMPTIVE POSTIVE SARS-CoV-2 nucleic acids MAY BE PRESENT.   A presumptive positive result was obtained on the submitted specimen  and confirmed on repeat testing.  While 2019 novel coronavirus  (SARS-CoV-2) nucleic acids may be present in the submitted sample  additional confirmatory testing may be necessary for epidemiological  and / or clinical management purposes  to differentiate between  SARS-CoV-2 and other Sarbecovirus currently known to infect humans.  If clinically indicated additional testing with an alternate test  methodology [phone]) is advised. The SARS-CoV-2 RNA is generally  detectable in upper and lower respiratory sp ecimens during the acute  phase of infection. The expected result is Negative. Fact Sheet for Patients:  StrictlyIdeas.no Fact Sheet for Healthcare Providers: BankingDealers.co.za This test is not yet approved or cleared by the [place] FDA and has been authorized for detection and/or diagnosis of SARS-CoV-2 by FDA under an Emergency Use Authorization (EUA).  This EUA will remain in effect (meaning this test can be used) for the duration of the COVID-19 declaration under Section 564(b)(1) of the Act, 21 U.S.C. section 360bbb-3(b)(1), unless the authorization is terminated or revoked sooner. Performed at [facility], [address] [place], [place] [address]     Coagulation Studies: No results for input(s): LABPROT, INR in the last 72 hours.  Urinalysis: No results for input(s): COLORURINE, LABSPEC, PHURINE, GLUCOSEU, HGBUR, BILIRUBINUR, KETONESUR, PROTEINUR, UROBILINOGEN, NITRITE, LEUKOCYTESUR in the last 72 hours.  Invalid input(s): APPERANCEUR    Imaging: US Venous Img Lower  Unilateral Left  Result Date: 01/10/2020 CLINICAL DATA:  Left leg swelling. EXAM: LEFT LOWER EXTREMITY VENOUS DOPPLER ULTRASOUND TECHNIQUE: Gray-scale sonography with graded compression, as well as color Doppler and duplex ultrasound were performed to evaluate the lower extremity deep venous systems from the level of the common femoral vein and including the common femoral, femoral, profunda femoral, popliteal and calf veins including the posterior tibial, peroneal and gastrocnemius veins when visible. The superficial great saphenous vein was also interrogated. Spectral Doppler was utilized to evaluate flow at rest and with distal augmentation maneuvers in the common femoral, femoral and popliteal veins. COMPARISON:  01/20/2017 FINDINGS: Contralateral Common Femoral Vein: Respiratory phasicity is normal and symmetric with the symptomatic side. No evidence of thrombus. Normal compressibility. Common Femoral Vein: No evidence of thrombus. Normal compressibility, respiratory phasicity and response to augmentation. Saphenofemoral Junction: No evidence of thrombus. Normal compressibility and flow on color Doppler imaging. Profunda Femoral Vein: No evidence of thrombus. Normal compressibility and flow on color Doppler imaging. Femoral Vein: No evidence of thrombus. Normal compressibility, respiratory phasicity and response to augmentation. Popliteal Vein: No evidence of thrombus. Normal compressibility, respiratory phasicity and response to augmentation. Calf Veins: Poorly visualized Superficial Great Saphenous Vein: No evidence of thrombus. Normal compressibility. Venous Reflux:  None. Other Findings:  None. IMPRESSION: 1. No evidence of deep venous thrombosis involving the left femoral or popliteal veins.  2. 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 Electronically Signed   By: Logan Bores M.D.   On: 01/10/2020 15:32      Medications:   . [START ON 01/11/2020] cefTRIAXone (ROCEPHIN)  IV    . [START ON 01/11/2020] vancomycin     . apixaban  5 mg Oral Q12H  . atorvastatin  10 mg Oral Daily  . carvedilol  6.25 mg Oral BID WC  . DULoxetine  120 mg Oral Daily  . [START ON 01/11/2020] fluticasone  1 spray Each Nare Daily  . furosemide  40 mg Oral BID  . insulin aspart  0-15 Units Subcutaneous TID WC  . insulin aspart  0-5 Units Subcutaneous QHS  . insulin detemir  5 Units Subcutaneous QHS  . [START ON 01/11/2020] multivitamin with minerals  1 tablet Oral Daily  . pantoprazole  40 mg Oral BID  . [START ON 01/11/2020] PARoxetine  40 mg Oral BH-[id number]  . pramipexole  1 mg Oral BID  . sucralfate  1 g Oral TID  . [START ON 01/11/2020] umeclidinium-vilanterol  1 puff Inhalation Daily   ondansetron **OR** ondansetron (ZOFRAN) IV, oxyCODONE-acetaminophen, senna-docusate, tiZANidine  Assessment/ Plan:  74 y.o. male followed in our office with past medical history of diabetes mellitus, hyperlipidemia, hypertension, renal cell carcinoma status post left nephrectomy in March of 2004, gastric ulcers, COPD, chronic lower back pain, carpal tunnel syndrome, status post gastric bypass in 2010, LLE DVT, right sided PE, hx of right chest tube placement.   1.  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  Renal function appears to be similar as to what it was in the office.  We will need to monitor his renal function closely while on diuretics.  2.  Lower extremity edema.  Patient has considerable lower extremity edema with weeping.  We will discontinue p.o. furosemide and switch the patient to Lasix drip while he is here.  3.  Left lower extremity cellulitis with weeping of the skin.  As above we will be intensifying his diuretic regimen.  Agree with ceftriaxone and vancomycin.  Recommend wound care evaluation as well.    LOS: 0 Dex [place] 1/26/20217:20 PM

## 2020-01-10 NOTE — ED Provider Notes (Signed)
[facility] Emergency Department Provider Note    First MD Initiated Contact with Patient 01/10/20 1421     (approximate)  I have reviewed the triage vital signs and the nursing notes.   HISTORY  Chief Complaint Wound Check    HPI [name] is a 74 y.o. male below his past medical history presents to the ER for worsening left lower extremity swelling and cellulitis and leg pain. Sent over by nephrology for IV antibiotics. Patient was recently on amoxicillin for cellulitis having chronic use stasis ulcers of the left leg but apparently it has been worsening. He is unable to tolerate compression hose due to severe pain. Not have any measured fevers. Has recently increased his Lasix but feels like the leg is still swollen. He is on Eliquis. Denies any chest pain or shortness of breath.    Past Medical History:  Diagnosis Date  . Chronic back pain   . COPD (chronic obstructive pulmonary disease) ([place])   . Depression   . Diabetes mellitus without complication ([place])   . Hyperlipidemia   . Hypertension   . Renal cell carcinoma ([place])   . Sleep apnea    Family History  Problem Relation Age of Onset  . Diabetes Mother   . Cancer Mother   . Heart disease Father    Past Surgical History:  Procedure Laterality Date  . CARPAL TUNNEL RELEASE    . CHOLECYSTECTOMY    . COLONOSCOPY WITH PROPOFOL N/A 07/29/2017   Procedure: COLONOSCOPY WITH PROPOFOL;  Surgeon: Manya Silvas, MD;  Location: [facility] ENDOSCOPY;  Service: Endoscopy;  Laterality: N/A;  . colonoscopys    . ESOPHAGOGASTRODUODENOSCOPY (EGD) WITH PROPOFOL N/A 07/29/2017   Procedure: ESOPHAGOGASTRODUODENOSCOPY (EGD) WITH PROPOFOL;  Surgeon: Manya Silvas, MD;  Location[facility]COPY;  Service: Endoscopy;  Laterality: N/A;  . FLEXIBLE SIGMOIDOSCOPY    . GASTRIC BYPASS    . NEPHRECTOMY    . open reduction and internal fixation, right distal radius    . right knee arthroscopy    . UPPER  GI ENDOSCOPY     Patient Active Problem List   Diagnosis Date Noted  . Proteinuria 09/20/2019  . Hyperkalemia 07/05/2019  . Lumbar degenerative disc disease 05/11/2019  . Spinal stenosis, lumbar region, with neurogenic claudication 05/11/2019  . Ankylosis of lumbar spine 05/11/2019  . Neuroforaminal stenosis of lumbar spine (left, L5/S1) 05/11/2019  . Lumbar facet arthropathy 05/11/2019  . Chronic pain syndrome 05/11/2019  . Chronic obstructive pulmonary disease ([place]) 01/03/2019  . Atherosclerotic peripheral vascular disease ([place]) 09/01/2018  . Lymphedema 06/22/2018  . Iron deficiency anemia 05/01/2018  . Anemia in chronic kidney disease 04/26/2018  . Varicose veins of leg with swelling, bilateral 04/09/2018  . SOB (shortness of breath) on exertion 03/17/2018  . Bilateral lower extremity edema 02/23/2018  . Lower extremity pain, bilateral 02/23/2018  . Essential hypertension 02/23/2018  . H/O deep venous thrombosis 01/23/2017  . Diabetes ([place]) 06/19/2014  . Chronic kidney disease, unspecified 06/19/2014  . Anxiety state 06/19/2014  . Other and unspecified hyperlipidemia 06/19/2014      Prior to Admission medications   Medication Sig Start Date End Date Taking? Authorizing Provider  ANORO ELLIPTA 62.5-25 MCG/INH AEPB Inhale 1 puff into the lungs daily.  05/18/19   [provider]  apixaban (ELIQUIS) 5 MG TABS tablet Take 5 mg by mouth every 12 (twelve) hours.     [provider]  atorvastatin (LIPITOR) 10 MG tablet Take 10 mg by  mouth daily.    [provider]  carvedilol (COREG) 6.25 MG tablet Take 6.25 mg by mouth 2 (two) times daily with a meal.    [provider]  doxycycline (VIBRAMYCIN) 100 MG capsule Take 1 capsule (100 mg total) by mouth daily. 10/24/19   Kris Hartmann, NP  DULoxetine (CYMBALTA) 60 MG capsule 120 mg daily.  09/01/19   [provider]  fluticasone (FLONASE) 50 MCG/ACT nasal spray Place 1 spray into both nostrils  2 (two) times a day.    [provider]  furosemide (LASIX) 40 MG tablet Take 1 tablet (40 mg total) by mouth daily. Patient taking differently: Take 40 mg by mouth 2 (two) times daily.  07/08/19   Loletha Grayer, MD  glipiZIDE (GLUCOTROL XL) 2.5 MG 24 hr tablet Take 1 tablet (2.5 mg total) by mouth daily with breakfast. 07/08/19   Loletha Grayer, MD  Multiple Vitamin (MULTIVITAMIN) tablet Take 1 tablet by mouth daily.    [provider]  oxyCODONE-acetaminophen (PERCOCET) 10-325 MG tablet Take 1 tablet by mouth every 8 (eight) hours as needed for pain. Must last 30 days. 01/10/20 02/09/20  Gillis Santa, MD  oxyCODONE-acetaminophen (PERCOCET) 10-325 MG tablet Take 1 tablet by mouth every 8 (eight) hours as needed for pain. Must last 30 days. 02/09/20 03/10/20  Gillis Santa, MD  oxyCODONE-acetaminophen (PERCOCET) 10-325 MG tablet Take 1 tablet by mouth every 8 (eight) hours as needed for pain. Must last 30 days. 03/10/20 04/09/20  Gillis Santa, MD  pantoprazole (PROTONIX) 40 MG tablet Take 40 mg by mouth 2 (two) times daily.     [provider]  PARoxetine (PAXIL) 40 MG tablet Take 40 mg by mouth every morning.     [provider]  potassium chloride (KLOR-CON) 20 MEQ packet Take by mouth daily.    [provider]  pramipexole (MIRAPEX) 1 MG tablet Take 1 mg by mouth 2 (two) times a day.  05/11/19   [provider]  sodium zirconium cyclosilicate (LOKELMA) 10 g PACK packet Take 10 g by mouth 2 (two) times daily. 07/08/19   Loletha Grayer, MD  sucralfate (CARAFATE) 1 g tablet Take 1 g by mouth 3 (three) times daily.     [provider]  tiZANidine (ZANAFLEX) 4 MG tablet Take 4 mg by mouth every 8 (eight) hours as needed for muscle spasms.    [provider]    Allergies Patient has no known allergies.    Social History Social History   Tobacco Use  . Smoking status: Former Smoker    Packs/day: 1.00    Years: 30.00     Pack years: 30.00  . Smokeless tobacco: Current User    Types: Chew  Substance Use Topics  . Alcohol use: No  . Drug use: No    Review of Systems Patient denies headaches, rhinorrhea, blurry vision, numbness, shortness of breath, chest pain, edema, cough, abdominal pain, nausea, vomiting, diarrhea, dysuria, fevers, rashes or hallucinations unless otherwise stated above in HPI. ____________________________________________   PHYSICAL EXAM:  VITAL SIGNS: Vitals:   01/10/20 1243  BP: (!) 142/73  Pulse: 90  Resp: 16  Temp: 98.4 F (36.9 C)  SpO2: 98%    Constitutional: Alert and oriented.  Eyes: Conjunctivae are normal.  Head: Atraumatic. Nose: No congestion/rhinnorhea. Mouth/Throat: Mucous membranes are moist.   Neck: No stridor. Painless ROM.  Cardiovascular: Normal rate, regular rhythm. Grossly normal heart sounds.  Good peripheral circulation. Respiratory: Normal respiratory effort.  No retractions.  Lungs CTAB. Gastrointestinal: Soft and nontender. No distention. No abdominal bruits. No CVA tenderness. Genitourinary:  Musculoskeletal: ttp with weaping cellulitis of left lower leg, no creiptus, ttp,  Overlying warmth.  Difficult to palpate DP and PT pulses 2/2 edema.  Cap refill 3 sec Neurologic:  Normal speech and language. No gross focal neurologic deficits are appreciated. No facial droop Skin:  Skin is warm, dry and intact. No rash noted. Psychiatric: Mood and affect are normal. Speech and behavior are normal.  ____________________________________________   LABS (all labs ordered are listed, but only abnormal results are displayed)  Results for orders placed or performed during the hospital encounter of 01/10/20 (from the past 24 hour(s))  Lactic acid, plasma     Status: None   Collection Time: 01/10/20 12:45 PM  Result Value Ref Range   Lactic Acid, Venous 0.6 0.5 - 1.9 mmol/L  Comprehensive metabolic panel     Status: Abnormal   Collection Time: 01/10/20 12:45  PM  Result Value Ref Range   Sodium 139 135 - 145 mmol/L   Potassium 4.6 3.5 - 5.1 mmol/L   Chloride 114 (H) 98 - 111 mmol/L   CO2 18 (L) 22 - 32 mmol/L   Glucose, Bld 163 (H) 70 - 99 mg/dL   BUN 30 (H) 8 - 23 mg/dL   Creatinine, Ser 1.59 (H) 0.61 - 1.24 mg/dL   Calcium 8.7 (L) 8.9 - 10.3 mg/dL   Total Protein 6.3 (L) 6.5 - 8.1 g/dL   Albumin 3.2 (L) 3.5 - 5.0 g/dL   AST 20 15 - 41 U/L   ALT 25 0 - 44 U/L   Alkaline Phosphatase 148 (H) 38 - 126 U/L   Total Bilirubin 0.4 0.3 - 1.2 mg/dL   GFR calc non Af Amer 43 (L) >60 mL/min   GFR calc Af Amer 50 (L) >60 mL/min   Anion gap 7 5 - 15  CBC with Differential     Status: Abnormal   Collection Time: 01/10/20 12:45 PM  Result Value Ref Range   WBC 7.4 4.0 - 10.5 K/uL   RBC 3.68 (L) 4.22 - 5.81 MIL/uL   Hemoglobin 9.8 (L) 13.0 - 17.0 g/dL   HCT 32.9 (L) 39.0 - 52.0 %   MCV 89.4 80.0 - 100.0 fL   MCH 26.6 26.0 - 34.0 pg   MCHC 29.8 (L) 30.0 - 36.0 g/dL   RDW 17.7 (H) 11.5 - 15.5 %   Platelets 239 150 - 400 K/uL   nRBC 0.0 0.0 - 0.2 %   Neutrophils Relative % 73 %   Neutro Abs 5.4 1.7 - 7.7 K/uL   Lymphocytes Relative 18 %   Lymphs Abs 1.3 0.7 - 4.0 K/uL   Monocytes Relative 6 %   Monocytes Absolute 0.5 0.1 - 1.0 K/uL   Eosinophils Relative 2 %   Eosinophils Absolute 0.2 0.0 - 0.5 K/uL   Basophils Relative 1 %   Basophils Absolute 0.1 0.0 - 0.1 K/uL   Immature Granulocytes 0 %   Abs Immature Granulocytes 0.02 0.00 - 0.07 K/uL   ____________________________________________ ____________________________________________  RADIOLOGY  Mix one tablespoon with 8oz of your favorite juice or water every day until you are having soft formed stools. Then start taking once daily if you didn't have a stool the day before.  ____________________________________________   PROCEDURES  Procedure(s) performed:  Procedures    Critical Care performed: no ____________________________________________   INITIAL IMPRESSION / ASSESSMENT  AND PLAN / ED COURSE  Pertinent  labs & imaging results that were available during my care of the patient were reviewed by me and considered in my medical decision making (see chart for details).   DDX: cellulitis, venous stasis, ulcer, edema, dvt  [name] is a 74 y.o. who presents to the ED with cellulitis failing outpatient management with multiple comorbidities and significant swelling and weeping cellulitis. He is not septic but will require IV antibiotics. Will give additional IV diuresis. Will confirm there is no evidence of obstructive DVT with patient is already on Eliquis. Will discuss with hospitalist for admission.     The patient was evaluated in Emergency Department today for the symptoms described in the history of present illness. He/she was evaluated in the context of the global COVID-19 pandemic, which necessitated consideration that the patient might be at risk for infection with the SARS-CoV-2 virus that causes COVID-19. Institutional protocols and algorithms that pertain to the evaluation of patients at risk for COVID-19 are in a state of rapid change based on information released by regulatory bodies including the CDC and federal and state organizations. These policies and algorithms were followed during the patient's care in the ED.  As part of my medical decision making, I reviewed the following data within the [facility] notes reviewed and incorporated, Labs reviewed, notes from prior ED visits and [place] Controlled Substance Database   ____________________________________________   FINAL CLINICAL IMPRESSION(S) / ED DIAGNOSES  Final diagnoses:  Cellulitis of left lower extremity      NEW MEDICATIONS STARTED DURING THIS VISIT:  New Prescriptions   No medications on file     Note:  This document was prepared using Dragon voice recognition software and may include unintentional dictation errors.    Merlyn Lot,  MD 01/10/20 [phone]

## 2020-01-11 DIAGNOSIS — L03116 Cellulitis of left lower limb: Principal | ICD-10-CM

## 2020-01-11 LAB — PROCALCITONIN: Procalcitonin: <0.1 ng/mL

## 2020-01-11 LAB — CBC
HCT: 32.5 % — ABNORMAL LOW (ref 39.0–52.0)
Hemoglobin: 9.8 g/dL — ABNORMAL LOW (ref 13.0–17.0)
MCH: 26.6 pg (ref 26.0–34.0)
MCHC: 30.2 g/dL (ref 30.0–36.0)
MCV: 88.1 fL (ref 80.0–100.0)
Platelets: 213 10*3/uL (ref 150–400)
RBC: 3.69 MIL/uL — ABNORMAL LOW (ref 4.22–5.81)
RDW: 17.5 % — ABNORMAL HIGH (ref 11.5–15.5)
WBC: 5.4 10*3/uL (ref 4.0–10.5)
nRBC: 0 % (ref 0.0–0.2)

## 2020-01-11 LAB — BASIC METABOLIC PANEL
Anion gap: 5 (ref 5–15)
BUN: 29 mg/dL — ABNORMAL HIGH (ref 8–23)
CO2: 22 mmol/L (ref 22–32)
Calcium: 8.8 mg/dL — ABNORMAL LOW (ref 8.9–10.3)
Chloride: 114 mmol/L — ABNORMAL HIGH (ref 98–111)
Creatinine, Ser: 1.31 mg/dL — ABNORMAL HIGH (ref 0.61–1.24)
GFR calc Af Amer: >60 mL/min (ref >60)
GFR calc non Af Amer: 54 mL/min — ABNORMAL LOW (ref >60)
Glucose, Bld: 158 mg/dL — ABNORMAL HIGH (ref 70–99)
Potassium: 4 mmol/L (ref 3.5–5.1)
Sodium: 141 mmol/L (ref 135–145)

## 2020-01-11 LAB — GLUCOSE, CAPILLARY
Glucose-Capillary: 159 mg/dL — ABNORMAL HIGH (ref 70–99)
Glucose-Capillary: 239 mg/dL — ABNORMAL HIGH (ref 70–99)
Glucose-Capillary: 151 mg/dL — ABNORMAL HIGH (ref 70–99)
Glucose-Capillary: 209 mg/dL — ABNORMAL HIGH (ref 70–99)

## 2020-01-11 LAB — PROTIME-INR
INR: 1.1 (ref 0.8–1.2)
Prothrombin Time: 13.6 seconds (ref 11.4–15.2)

## 2020-01-11 MED ORDER — INSULIN DETEMIR 100 UNIT/ML ~~LOC~~ SOLN
8.0000 [IU] | Freq: Every day | SUBCUTANEOUS | Status: DC
  Administered 2020-01-11 – 2020-01-12 (×2): 8 [IU] via SUBCUTANEOUS
  Filled 2020-01-11 (×2): qty 0.08

## 2020-01-11 MED ORDER — VANCOMYCIN HCL 1500 MG/300ML IV SOLN
1500.0000 mg | INTRAVENOUS | Status: DC
  Administered 2020-01-11 – 2020-01-12 (×2): 1500 mg via INTRAVENOUS
  Filled 2020-01-11 (×2): qty 300

## 2020-01-11 MED ORDER — HYDROCERIN EX CREA
TOPICAL_CREAM | Freq: Two times a day (BID) | CUTANEOUS | Status: DC
  Filled 2020-01-11: qty 113

## 2020-01-11 MED ORDER — TRIAMCINOLONE 0.1 % CREAM:EUCERIN CREAM 1:1: TOPICAL_CREAM | Freq: Two times a day (BID) | CUTANEOUS | Status: DC

## 2020-01-11 MED ORDER — TRIAMCINOLONE ACETONIDE 0.1 % EX CREA
TOPICAL_CREAM | Freq: Two times a day (BID) | CUTANEOUS | Status: DC
  Filled 2020-01-11: qty 15

## 2020-01-11 NOTE — Progress Notes (Signed)
PROGRESS NOTE    [name]  IZT:[id number] DOB: 15-Aug-1946 DOA: 01/10/2020 PCP: Derinda Late, MD   Brief Narrative:  74 y.o. male  with past medical history of diabetes mellitus, hyperlipidemia, hypertension, renal cell carcinoma status post left nephrectomy in March of 2004, gastric ulcers, COPD, chronic lower back pain, carpal tunnel syndrome, status post gastric bypass in 2010, LLE DVT, right sided PE, hx of right chest tube placement, presented to ED with worsening left lower extremity swelling and erythema.  Recently treated for left lower extremity cellulitis with Augmentin for 7 days.  His symptoms improved initially and then worsened again after discontinuation of antibiotic.  Was admitted for failed outpatient therapy.  Subjective: Patient was feeling better when seen this morning.  He continues to have some left lower extremity pain, stating that it is improving.  Denies any fever or chills.  No nausea or vomiting.  He was worried about his wife and would like to go back home as soon as possible, stating that he wants to be better for her so he can take care of her.  Assessment & Plan:   Active Problems:   Cellulitis  Left lower extremity cellulitis.  No systemic infection, afebrile and no leukocytosis.  No purulent discharge.  Most likely secondary to chronic lower extremity edema.  He was treated with outpatient antibiotics with Augmentin for 1 week and responded during the treatment. Procalcitonin less than 0.10. Placed on IV ceftriaxone and vancomycin. -I will continue ceftriaxone and vancomycin today, can be discharge on doxycycline once cleared from nephrology standpoint. -He was placed on Lasix gtt. with improvement in lower extremity edema. -Wound care was consulted-appreciate their recommendations. -PT/OT evaluation.  Chronic kidney disease stage IIIa.  Creatinine improving with good urinary output.  Nephrology is following. -Continue with Lasix gtt. per  nephrology. -Monitor renal function. -Avoid nephrotoxic  Iron deficiency anemia.  Being managed by hematology with IV iron infusions.  Last infusion was in December 2020. -Hemoglobin stable around 9.8.  Hyperlipidemia Continue home atorvastatin  Hypertension Continue home Coreg Lasix as above  Diabetes mellitus.  A1c of 7.2.  Mildly elevated CBG. Hold oral agents Increase Lantus 8 units daily Moderate sliding scale.  Chronic back pain Followed by pain management Continue home outpatient pain regimen  Depression/anxiety Continue home Paxil Continue home Cymbalta  COPD Not in acute exacerbation Continue home fluticasone Patient substitution for Anoro Ellipta  Hidtory of PE.  No acute concern -Continue home dose of Eliquis.  Objective: Vitals:   01/10/20 1648 01/10/20 2101 01/11/20 0036 01/11/20 0746  BP: (!) 153/80 134/67 (!) 104/51 130/68  Pulse: 79 73 74 71  Resp: 17 16 14 16   Temp: 98.2 F (36.8 C) 98 F (36.7 C) 97.9 F (36.6 C) 97.8 F (36.6 C)  TempSrc: Oral Oral Oral Oral  SpO2: 94% 96% 99% 98%  Weight:      Height:        Intake/Output Summary (Last 24 hours) at 01/11/2020 1327 Last data filed at 01/11/2020 1210 Gross per 24 hour  Intake 705.07 ml  Output 5330 ml  Net -4624.93 ml   Filed Weights   01/10/20 1244  Weight: 121 kg    Examination:  General exam: Appears calm and comfortable  Respiratory system: Clear to auscultation. Respiratory effort normal. Cardiovascular system: S1 & S2 heard, RRR. No JVD, murmurs, rubs, gallops or clicks. Gastrointestinal system: Soft, nontender, nondistended, bowel sounds positive. Central nervous system: Alert and oriented. No focal neurological deficits.Symmetric 5 x 5  power. Extremities: 2+ lower extremity edema, left worse than right, left with erythema and skin breakage, pulses intact and symmetrical. Psychiatry: Judgement and insight appear normal. Mood & affect appropriate.    DVT prophylaxis:  Eliquis Code Status: Full Family Communication: No family at bedside. Disposition Plan: Pending improvement.  We will go back home.  Consultants:   Nephrology  Procedures:  Antimicrobials:  Ceftriaxone Vancomycin  Data Reviewed: I have personally reviewed following labs and imaging studies  CBC: Recent Labs  Lab 01/10/20 1245 01/11/20 0339  WBC 7.4 5.4  NEUTROABS 5.4  --   HGB 9.8* 9.8*  HCT 32.9* 32.5*  MCV 89.4 88.1  PLT 239 213   Basic Metabolic Panel: Recent Labs  Lab 01/10/20 1245 01/11/20 0339  NA 139 141  K 4.6 4.0  CL 114* 114*  CO2 18* 22  GLUCOSE 163* 158*  BUN 30* 29*  CREATININE 1.59* 1.31*  CALCIUM 8.7* 8.8*   GFR: Estimated Creatinine Clearance: 69.5 mL/min (A) (by C-G formula based on SCr of 1.31 mg/dL (H)). Liver Function Tests: Recent Labs  Lab 01/10/20 1245  AST 20  ALT 25  ALKPHOS 148*  BILITOT 0.4  PROT 6.3*  ALBUMIN 3.2*   No results for input(s): LIPASE, AMYLASE in the last 168 hours. No results for input(s): AMMONIA in the last 168 hours. Coagulation Profile: Recent Labs  Lab 01/11/20 0339  INR 1.1   Cardiac Enzymes: No results for input(s): CKTOTAL, CKMB, CKMBINDEX, TROPONINI in the last 168 hours. BNP (last 3 results) No results for input(s): PROBNP in the last 8760 hours. HbA1C: Recent Labs    01/10/20 1245  HGBA1C 7.2*   CBG: Recent Labs  Lab 01/10/20 1651 01/10/20 2051 01/11/20 0831 01/11/20 1152  GLUCAP 143* 242* 159* 239*   Lipid Profile: No results for input(s): CHOL, HDL, LDLCALC, TRIG, CHOLHDL, LDLDIRECT in the last 72 hours. Thyroid Function Tests: No results for input(s): TSH, T4TOTAL, FREET4, T3FREE, THYROIDAB in the last 72 hours. Anemia Panel: No results for input(s): VITAMINB12, FOLATE, FERRITIN, TIBC, IRON, RETICCTPCT in the last 72 hours. Sepsis Labs: Recent Labs  Lab 01/10/20 1245 01/11/20 0339  PROCALCITON  --  <0.10  LATICACIDVEN 0.6  --     No results found for this or any  previous visit (from the past 240 hour(s)).   Radiology Studies: US Venous Img Lower Unilateral Left  Result Date: 01/10/2020 CLINICAL DATA:  Left leg swelling. EXAM: LEFT LOWER EXTREMITY VENOUS DOPPLER ULTRASOUND TECHNIQUE: Gray-scale sonography with graded compression, as well as color Doppler and duplex ultrasound were performed to evaluate the lower extremity deep venous systems from the level of the common femoral vein and including the common femoral, femoral, profunda femoral, popliteal and calf veins including the posterior tibial, peroneal and gastrocnemius veins when visible. The superficial great saphenous vein was also interrogated. Spectral Doppler was utilized to evaluate flow at rest and with distal augmentation maneuvers in the common femoral, femoral and popliteal veins. COMPARISON:  01/20/2017 FINDINGS: Contralateral Common Femoral Vein: Respiratory phasicity is normal and symmetric with the symptomatic side. No evidence of thrombus. Normal compressibility. Common Femoral Vein: No evidence of thrombus. Normal compressibility, respiratory phasicity and response to augmentation. Saphenofemoral Junction: No evidence of thrombus. Normal compressibility and flow on color Doppler imaging. Profunda Femoral Vein: No evidence of thrombus. Normal compressibility and flow on color Doppler imaging. Femoral Vein: No evidence of thrombus. Normal compressibility, respiratory phasicity and response to augmentation. Popliteal Vein: No evidence of thrombus. Normal compressibility, respiratory phasicity  and response to augmentation. Calf Veins: Poorly visualized Superficial Great Saphenous Vein: No evidence of thrombus. Normal compressibility. Venous Reflux:  None. Other Findings:  None. IMPRESSION: 1. No evidence of deep venous thrombosis involving the left femoral or popliteal veins. 2. Poor visualization of the left calf veins, potentially related to chronic DVT given the presence of occlusive thrombus in  2018. Electronically Signed   By: Logan Bores M.D.   On: 01/10/2020 15:32    Scheduled Meds: . apixaban  5 mg Oral Q12H  . atorvastatin  10 mg Oral Daily  . carvedilol  6.25 mg Oral BID WC  . DULoxetine  120 mg Oral Daily  . fluticasone  1 spray Each Nare Daily  . hydrocerin   Topical BID  . insulin aspart  0-15 Units Subcutaneous TID WC  . insulin aspart  0-5 Units Subcutaneous QHS  . insulin detemir  5 Units Subcutaneous QHS  . multivitamin with minerals  1 tablet Oral Daily  . pantoprazole  40 mg Oral BID  . PARoxetine  40 mg Oral BH-q7a  . pramipexole  1 mg Oral BID  . sucralfate  1 g Oral TID  . triamcinolone cream   Topical BID  . umeclidinium-vilanterol  1 puff Inhalation Daily   Continuous Infusions: . cefTRIAXone (ROCEPHIN)  IV    . furosemide (LASIX) infusion 4 mg/hr (01/11/20 0930)  . vancomycin       LOS: 1 day   Time spent: 40 minutes  Lorella Nimrod, MD [facility] Pager [phone]  If 7PM-7AM, please contact night-coverage [email or web address] Password [facility] 01/11/2020, 1:27 PM   This record has been created using Systems analyst. Errors have been sought and corrected,but may not always be located. Such creation errors do not reflect on the standard of care.

## 2020-01-11 NOTE — Progress Notes (Signed)
[facility]  ROUNDING NOTE   Subjective:  Patient with his legs elevated this a.m. Lower extremity edema has started to improve significantly. There has been some mild improvement in cellulitis.    Objective:  Vital signs in last 24 hours:  Temp:  [97.8 F (36.6 C)-98.4 F (36.9 C)] 97.8 F (36.6 C) (01/27 0746) Pulse Rate:  [71-90] 71 (01/27 0746) Resp:  [14-19] 16 (01/27 0746) BP: (104-153)/(51-80) 130/68 (01/27 0746) SpO2:  [94 %-99 %] 98 % (01/27 0746) Weight:  [121 kg] 121 kg (01/26 1244)  Weight change:  Filed Weights   01/10/20 1244  Weight: 121 kg    Intake/Output: I/O last 3 completed shifts: In: 465.1 [P.O.:240; IV Piggyback:225.1] Out: 8127 [Urine:4180]   Intake/Output this shift:  Total I/O In: 240 [P.O.:240] Out: -   Physical Exam: General: No acute distress  Head: Normocephalic, atraumatic. Moist oral mucosal membranes  Eyes: Anicteric  Neck: Supple, trachea midline  Lungs:  Clear to auscultation, normal effort  Heart: S1S2 no rubs  Abdomen:  Soft, nontender, bowel sounds present  Extremities:  2+ bilateral lower extremity edema with left lower extremity cellulitis and right lower extremity hyperemia  Neurologic: Awake, alert, following commands  Skin: Improving cellulitis of the left lower extremity       Basic Metabolic Panel: Recent Labs  Lab 01/10/20 1245 01/11/20 0339  NA 139 141  K 4.6 4.0  CL 114* 114*  CO2 18* 22  GLUCOSE 163* 158*  BUN 30* 29*  CREATININE 1.59* 1.31*  CALCIUM 8.7* 8.8*    Liver Function Tests: Recent Labs  Lab 01/10/20 1245  AST 20  ALT 25  ALKPHOS 148*  BILITOT 0.4  PROT 6.3*  ALBUMIN 3.2*   No results for input(s): LIPASE, AMYLASE in the last 168 hours. No results for input(s): AMMONIA in the last 168 hours.  CBC: Recent Labs  Lab 01/10/20 1245 01/11/20 0339  WBC 7.4 5.4  NEUTROABS 5.4  --   HGB 9.8* 9.8*  HCT 32.9* 32.5*  MCV 89.4 88.1  PLT 239 213    Cardiac  Enzymes: No results for input(s): CKTOTAL, CKMB, CKMBINDEX, TROPONINI in the last 168 hours.  BNP: Invalid input(s): POCBNP  CBG: Recent Labs  Lab 01/10/20 1651 01/10/20 2051 01/11/20 0831 01/11/20 1152  GLUCAP 143* 242* 159* 239*    Microbiology: Results for orders placed or performed during the hospital encounter of 07/05/19  SARS Coronavirus 2 (CEPHEID - Performed in [place] hospital lab), Hosp Order     Status: None   Collection Time: 07/05/19  1:37 PM   Specimen: Nasopharyngeal Swab  Result Value Ref Range Status   SARS Coronavirus 2 NEGATIVE NEGATIVE Final    Comment: (NOTE) If result is NEGATIVE SARS-CoV-2 target nucleic acids are NOT DETECTED. The SARS-CoV-2 RNA is generally detectable in upper and lower  respiratory specimens during the acute phase of infection. The lowest  concentration of SARS-CoV-2 viral copies this assay can detect is 250  copies / mL. A negative result does not preclude SARS-CoV-2 infection  and should not be used as the sole basis for treatment or other  patient management decisions.  A negative result may occur with  improper specimen collection / handling, submission of specimen other  than nasopharyngeal swab, presence of viral mutation(s) within the  areas targeted by this assay, and inadequate number of viral copies  (<250 copies / mL). A negative result must be combined with clinical  observations, patient history, and epidemiological information.  If result is POSITIVE SARS-CoV-2 target nucleic acids are DETECTED. The SARS-CoV-2 RNA is generally detectable in upper and lower  respiratory specimens dur ing the acute phase of infection.  Positive  results are indicative of active infection with SARS-CoV-2.  Clinical  correlation with patient history and other diagnostic information is  necessary to determine patient infection status.  Positive results do  not rule out bacterial infection or co-infection with other viruses. If result  is PRESUMPTIVE POSTIVE SARS-CoV-2 nucleic acids MAY BE PRESENT.   A presumptive positive result was obtained on the submitted specimen  and confirmed on repeat testing.  While 2019 novel coronavirus  (SARS-CoV-2) nucleic acids may be present in the submitted sample  additional confirmatory testing may be necessary for epidemiological  and / or clinical management purposes  to differentiate between  SARS-CoV-2 and other Sarbecovirus currently known to infect humans.  If clinically indicated additional testing with an alternate test  methodology [phone]) is advised. The SARS-CoV-2 RNA is generally  detectable in upper and lower respiratory sp ecimens during the acute  phase of infection. The expected result is Negative. Fact Sheet for Patients:  StrictlyIdeas.no Fact Sheet for Healthcare Providers: BankingDealers.co.za This test is not yet approved or cleared by the [place] FDA and has been authorized for detection and/or diagnosis of SARS-CoV-2 by FDA under an Emergency Use Authorization (EUA).  This EUA will remain in effect (meaning this test can be used) for the duration of the COVID-19 declaration under Section 564(b)(1) of the Act, 21 U.S.C. section 360bbb-3(b)(1), unless the authorization is terminated or revoked sooner. Performed at [facility], [address] [place], [place] [address]     Coagulation Studies: Recent Labs    01/11/20 0339  LABPROT 13.6  INR 1.1    Urinalysis: No results for input(s): COLORURINE, LABSPEC, PHURINE, GLUCOSEU, HGBUR, BILIRUBINUR, KETONESUR, PROTEINUR, UROBILINOGEN, NITRITE, LEUKOCYTESUR in the last 72 hours.  Invalid input(s): APPERANCEUR    Imaging: US Venous Img Lower Unilateral Left  Result Date: 01/10/2020 CLINICAL DATA:  Left leg swelling. EXAM: LEFT LOWER EXTREMITY VENOUS DOPPLER ULTRASOUND TECHNIQUE: Gray-scale sonography with graded compression, as well as color  Doppler and duplex ultrasound were performed to evaluate the lower extremity deep venous systems from the level of the common femoral vein and including the common femoral, femoral, profunda femoral, popliteal and calf veins including the posterior tibial, peroneal and gastrocnemius veins when visible. The superficial great saphenous vein was also interrogated. Spectral Doppler was utilized to evaluate flow at rest and with distal augmentation maneuvers in the common femoral, femoral and popliteal veins. COMPARISON:  01/20/2017 FINDINGS: Contralateral Common Femoral Vein: Respiratory phasicity is normal and symmetric with the symptomatic side. No evidence of thrombus. Normal compressibility. Common Femoral Vein: No evidence of thrombus. Normal compressibility, respiratory phasicity and response to augmentation. Saphenofemoral Junction: No evidence of thrombus. Normal compressibility and flow on color Doppler imaging. Profunda Femoral Vein: No evidence of thrombus. Normal compressibility and flow on color Doppler imaging. Femoral Vein: No evidence of thrombus. Normal compressibility, respiratory phasicity and response to augmentation. Popliteal Vein: No evidence of thrombus. Normal compressibility, respiratory phasicity and response to augmentation. Calf Veins: Poorly visualized Superficial Great Saphenous Vein: No evidence of thrombus. Normal compressibility. Venous Reflux:  None. Other Findings:  None. IMPRESSION: 1. No evidence of deep venous thrombosis involving the left femoral or popliteal veins. 2. 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 Electronically Signed  By: Logan Bores M.D.   On: 01/10/2020 15:32     Medications:   . cefTRIAXone (ROCEPHIN)  IV    . furosemide (LASIX) infusion 4 mg/hr (01/11/20 0930)  . vancomycin     . apixaban  5 mg Oral Q12H  . atorvastatin  10 mg Oral Daily  . carvedilol  6.25 mg  Oral BID WC  . DULoxetine  120 mg Oral Daily  . fluticasone  1 spray Each Nare Daily  . insulin aspart  0-15 Units Subcutaneous TID WC  . insulin aspart  0-5 Units Subcutaneous QHS  . insulin detemir  5 Units Subcutaneous QHS  . multivitamin with minerals  1 tablet Oral Daily  . pantoprazole  40 mg Oral BID  . PARoxetine  40 mg Oral BH-q7a  . pramipexole  1 mg Oral BID  . sucralfate  1 g Oral TID  . triamcinolone 0.1 % cream : eucerin   Topical BID  . umeclidinium-vilanterol  1 puff Inhalation Daily   ondansetron **OR** ondansetron (ZOFRAN) IV, oxyCODONE-acetaminophen, senna-docusate, tiZANidine  Assessment/ Plan:  74 y.o. male followed in our office with past medical history of diabetes mellitus, hyperlipidemia, hypertension, renal cell carcinoma status post left nephrectomy in March of 2004, gastric ulcers, COPD, chronic lower back pain, carpal tunnel syndrome, status post gastric bypass in 2010, LLE DVT, right sided PE, hx of right chest tube placement.   1.  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  Renal parameters actually improved.  BUN down to 29 with a creatinine of 1.31.  Continue to monitor renal parameters daily.  2.  Lower extremity edema.  Improved with diuretics as well as leg elevation.  Maintain the patient on low-dose Lasix drip at 4 mg IV per hour while here.  3.  Left lower extremity cellulitis with weeping of the skin.  Patient continued on ceftriaxone and vancomycin.  Significant improvement in cellulitis noted.  Appreciate input from wound care nurse as well.    LOS: 1 Lasheika Ortloff 1/27/202112:21 PM

## 2020-01-11 NOTE — Plan of Care (Signed)

## 2020-01-11 NOTE — Consult Note (Signed)
WOC Nurse Consult Note: Reason for Consult:Cellulitis to left anterior lower leg.  Is on vancomycin.  Has pneumatic leg pumps he wears at home.  Worn wraps before but states MD discontinued that a year or so ago. He has tried removable compression garments and agrees to resume this at home once the pain has improved.  Wound type:infectious Pressure Injury POA: NA Measurement: 6 cm x 3.6 cm erythema to anterior lower leg.  Wound OMQ:[id number] and warmth.  Patient notes last week this area was blistered and painful.  Drainage (amount, consistency, odor) none noted Periwound: erythema and edema.  Dressing procedure/placement/frequency: Cleanse bilateral lower legs with soap and water.  Apply Eucerin cream daily.   Will not follow at this time.  Please re-consult if needed.  Domenic Moras MSN, RN, FNP-BC CWON Wound, Ostomy, Continence Nurse Pager [phone]

## 2020-01-11 NOTE — Consult Note (Signed)
Pharmacy Antibiotic Note  [name] is a 74 y.o. male admitted on 01/10/2020 with cellulitis.  Pharmacy has been consulted for vancomycin dosing.Patient is also receiving ceftriaxone 1g IV every 24 hours.   Patient received vancomycin 2500 mg loading dose 1/26.   Plan: Scr improved 1.59>> 1.31.  Will adjust dose to 1500 mg q24H. Goal AUC 400-550. Expected AUC: 456 SCr used: 1.31  Plan to obtain vancomycin level in 4-5 days.   Height: 6' (182.9 cm) Weight: 266 lb 12.1 oz (121 kg) IBW/kg (Calculated) : 77.6  Temp (24hrs), Avg:98.1 F (36.7 C), Min:97.9 F (36.6 C), Max:98.4 F (36.9 C)  Recent Labs  Lab 01/10/20 1245 01/11/20 0339  WBC 7.4 5.4  CREATININE 1.59* 1.31*  LATICACIDVEN 0.6  --     Estimated Creatinine Clearance: 69.5 mL/min (A) (by C-G formula based on SCr of 1.31 mg/dL (H)).    No Known Allergies  Antimicrobials this admission: 1/26 vancomycin >>  1/26 ceftriaxone >>   Dose adjustments this admission: None  Thank you for allowing pharmacy to be a part of this patient's care.  Pernell Dupre, PharmD, BCPS 01/11/2020 7:35 AM

## 2020-01-12 LAB — GLUCOSE, CAPILLARY
Glucose-Capillary: 168 mg/dL — ABNORMAL HIGH (ref 70–99)
Glucose-Capillary: 169 mg/dL — ABNORMAL HIGH (ref 70–99)
Glucose-Capillary: 175 mg/dL — ABNORMAL HIGH (ref 70–99)
Glucose-Capillary: 225 mg/dL — ABNORMAL HIGH (ref 70–99)

## 2020-01-12 LAB — RENAL FUNCTION PANEL
Albumin: 3 g/dL — ABNORMAL LOW (ref 3.5–5.0)
Anion gap: 9 (ref 5–15)
BUN: 27 mg/dL — ABNORMAL HIGH (ref 8–23)
CO2: 22 mmol/L (ref 22–32)
Calcium: 8.7 mg/dL — ABNORMAL LOW (ref 8.9–10.3)
Chloride: 108 mmol/L (ref 98–111)
Creatinine, Ser: 1.46 mg/dL — ABNORMAL HIGH (ref 0.61–1.24)
GFR calc Af Amer: 55 mL/min — ABNORMAL LOW (ref >60)
GFR calc non Af Amer: 48 mL/min — ABNORMAL LOW (ref >60)
Glucose, Bld: 192 mg/dL — ABNORMAL HIGH (ref 70–99)
Phosphorus: 3.2 mg/dL (ref 2.5–4.6)
Potassium: 4.1 mmol/L (ref 3.5–5.1)
Sodium: 139 mmol/L (ref 135–145)

## 2020-01-12 LAB — PROCALCITONIN: Procalcitonin: <0.1 ng/mL

## 2020-01-12 NOTE — TOC Initial Note (Signed)
Transition of Care [facility]) - Initial/Assessment Note    Patient Details  Name: [name] MRN: [id number] Date of Birth: 30-Jul-1946  Transition of Care [facility]) CM/SW Contact:    Elease Hashimoto, LCSW Phone Number: 01/12/2020, 1:50 PM  Clinical Narrative:  Met with pt to discuss discharge needs. He reports he did not think he would be here this long. He was going to [facility] for his back issues, but now has leg issues. His wife had a TKR in Nov 2020 and then fell and fractured her shoulder, at this time se started having memory issues and neurologist diagnosed her with progressive dementia. Pt reports he had to hire 24/7 care for her at home while he is here. They have a daughter and pt's sister who are involved also. Recently wife was diagnosed with UTI and B12 deficiency. She is taking medicine and is improving mentally which pt is holding onto. He feels once he can get home he can decrease the caregivers. Discussed will work on any discharge needs. Wife is [facility] and OT for her shoulder issues. He is not sure if he will need this. Will follow along and if any needs arise will discuss with pt.          Expected Discharge Plan: [place] Barriers to Discharge: Continued Medical Work up   Patient Goals and CMS Choice Patient states their goals for this hospitalization and ongoing recovery are:: Want to get home to my wife I was helping her at home before this      Expected Discharge Plan and Services Expected Discharge Plan: [place] In-house Referral: Clinical Social Work Discharge Planning Services: NA                                          Prior Living Arrangements/Services   Lives with:: Spouse              Current home services: Other (comment)([facility] for his [facility] issues)    Activities of Daily Living Home Assistive Devices/Equipment: Cane (specify quad or straight) ADL  Screening (condition at time of admission) Patient's cognitive ability adequate to safely complete daily activities?: Yes Is the patient deaf or have difficulty hearing?: No Does the patient have difficulty seeing, even when wearing glasses/contacts?: No Does the patient have difficulty concentrating, remembering, or making decisions?: No Patient able to express need for assistance with ADLs?: Yes Does the patient have difficulty dressing or bathing?: No Independently performs ADLs?: Yes (appropriate for developmental age) Does the patient have difficulty walking or climbing stairs?: No Weakness of Legs: None Weakness of Arms/Hands: None  Permission Sought/Granted                  Emotional Assessment Appearance:: Appears younger than stated age Attitude/Demeanor/Rapport: Engaged Affect (typically observed): Calm, Accepting Orientation: : Oriented to Self, Oriented to Place, Oriented to  Time, Oriented to Situation      Admission diagnosis:  Cellulitis [L03.90] Cellulitis of left lower extremity [L03.116] Patient Active Problem List   Diagnosis Date Noted  . Cellulitis 01/10/2020  . Proteinuria 09/20/2019  . Hyperkalemia 07/05/2019  . Lumbar degenerative disc disease 05/11/2019  . Spinal stenosis, lumbar region, with neurogenic claudication 05/11/2019  . Ankylosis of lumbar spine 05/11/2019  . Neuroforaminal stenosis of lumbar spine (left, L5/S1) 05/11/2019  .  Lumbar facet arthropathy 05/11/2019  . Chronic pain syndrome 05/11/2019  . Chronic obstructive pulmonary disease ([place]) 01/03/2019  . Atherosclerotic peripheral vascular disease ([place]) 09/01/2018  . Lymphedema 06/22/2018  . Iron deficiency anemia 05/01/2018  . Anemia in chronic kidney disease 04/26/2018  . Varicose veins of leg with swelling, bilateral 04/09/2018  . SOB (shortness of breath) on exertion 03/17/2018  . Bilateral lower extremity edema 02/23/2018  . Lower extremity pain, bilateral 02/23/2018  .  Essential hypertension 02/23/2018  . H/O deep venous thrombosis 01/23/2017  . Diabetes ([place]) 06/19/2014  . Chronic kidney disease, unspecified 06/19/2014  . Anxiety state 06/19/2014  . Other and unspecified hyperlipidemia 06/19/2014   PCP:  Derinda Late, MD Pharmacy:   [id number] [place] [id number]: [phone]: [phone]    Social Determinants of Health (SDOH) Interventions    Readmission Risk Interventions Readmission Risk Prevention Plan 07/08/2019  Transportation Screening Complete  PCP or Specialist Appt within 3-5 Days Complete  [place] or [place]  Social Work Consult for [place]/Counseling Complete  Palliative Care Screening Not Applicable  Medication Review (Press photographer Complete  Some recent data might be hidden

## 2020-01-12 NOTE — Progress Notes (Signed)
[facility]  ROUNDING NOTE   Subjective:  Patient is lower extremity edema continues to improve. Cellulitis of the left lower extremity has also improved. Urine output was 3.6 L over the preceding 24 hours.  Objective:  Vital signs in last 24 hours:  Temp:  [97.4 F (36.3 C)-98.1 F (36.7 C)] 98.1 F (36.7 C) (01/28 0803) Pulse Rate:  [64-70] 70 (01/28 0803) Resp:  [18-20] 18 (01/28 0803) BP: (109-125)/(65-69) 125/68 (01/28 0803) SpO2:  [95 %-97 %] 97 % (01/28 0803)  Weight change:  Filed Weights   01/10/20 1244  Weight: 121 kg    Intake/Output: I/O last 3 completed shifts: In: 1187 [P.O.:720; I.V.:67; IV Piggyback:400] Out: 1610 [Urine:6555]   Intake/Output this shift:  Total I/O In: 480 [P.O.:480] Out: 1100 [Urine:1100]  Physical Exam: General: No acute distress  Head: Normocephalic, atraumatic. Moist oral mucosal membranes  Eyes: Anicteric  Neck: Supple, trachea midline  Lungs:  Clear to auscultation, normal effort  Heart: S1S2 no rubs  Abdomen:  Soft, nontender, bowel sounds present  Extremities:  1+ bilateral lower extremity edema with left lower extremity cellulitis and right lower extremity hyperemia  Neurologic: Awake, alert, following commands  Skin: Improving cellulitis of the left lower extremity       Basic Metabolic Panel: Recent Labs  Lab 01/10/20 1245 01/11/20 0339 01/12/20 0623  NA 139 141 139  K 4.6 4.0 4.1  CL 114* 114* 108  CO2 18* 22 22  GLUCOSE 163* 158* 192*  BUN 30* 29* 27*  CREATININE 1.59* 1.31* 1.46*  CALCIUM 8.7* 8.8* 8.7*  PHOS  --   --  3.2    Liver Function Tests: Recent Labs  Lab 01/10/20 1245 01/12/20 0623  AST 20  --   ALT 25  --   ALKPHOS 148*  --   BILITOT 0.4  --   PROT 6.3*  --   ALBUMIN 3.2* 3.0*   No results for input(s): LIPASE, AMYLASE in the last 168 hours. No results for input(s): AMMONIA in the last 168 hours.  CBC: Recent Labs  Lab 01/10/20 1245 01/11/20 0339  WBC 7.4 5.4   NEUTROABS 5.4  --   HGB 9.8* 9.8*  HCT 32.9* 32.5*  MCV 89.4 88.1  PLT 239 213    Cardiac Enzymes: No results for input(s): CKTOTAL, CKMB, CKMBINDEX, TROPONINI in the last 168 hours.  BNP: Invalid input(s): POCBNP  CBG: Recent Labs  Lab 01/11/20 1152 01/11/20 1654 01/11/20 2141 01/12/20 0800 01/12/20 1209  GLUCAP 239* 151* 209* 168* 169*    Microbiology: Results for orders placed or performed during the hospital encounter of 07/05/19  SARS Coronavirus 2 (CEPHEID - Performed in [place] hospital lab), Hosp Order     Status: None   Collection Time: 07/05/19  1:37 PM   Specimen: Nasopharyngeal Swab  Result Value Ref Range Status   SARS Coronavirus 2 NEGATIVE NEGATIVE Final    Comment: (NOTE) If result is NEGATIVE SARS-CoV-2 target nucleic acids are NOT DETECTED. The SARS-CoV-2 RNA is generally detectable in upper and lower  respiratory specimens during the acute phase of infection. The lowest  concentration of SARS-CoV-2 viral copies this assay can detect is 250  copies / mL. A negative result does not preclude SARS-CoV-2 infection  and should not be used as the sole basis for treatment or other  patient management decisions.  A negative result may occur with  improper specimen collection / handling, submission of specimen other  than nasopharyngeal swab, presence of viral  mutation(s) within the  areas targeted by this assay, and inadequate number of viral copies  (<250 copies / mL). A negative result must be combined with clinical  observations, patient history, and epidemiological information. If result is POSITIVE SARS-CoV-2 target nucleic acids are DETECTED. The SARS-CoV-2 RNA is generally detectable in upper and lower  respiratory specimens dur ing the acute phase of infection.  Positive  results are indicative of active infection with SARS-CoV-2.  Clinical  correlation with patient history and other diagnostic information is  necessary to determine patient  infection status.  Positive results do  not rule out bacterial infection or co-infection with other viruses. If result is PRESUMPTIVE POSTIVE SARS-CoV-2 nucleic acids MAY BE PRESENT.   A presumptive positive result was obtained on the submitted specimen  and confirmed on repeat testing.  While 2019 novel coronavirus  (SARS-CoV-2) nucleic acids may be present in the submitted sample  additional confirmatory testing may be necessary for epidemiological  and / or clinical management purposes  to differentiate between  SARS-CoV-2 and other Sarbecovirus currently known to infect humans.  If clinically indicated additional testing with an alternate test  methodology [phone]) is advised. The SARS-CoV-2 RNA is generally  detectable in upper and lower respiratory sp ecimens during the acute  phase of infection. The expected result is Negative. Fact Sheet for Patients:  StrictlyIdeas.no Fact Sheet for Healthcare Providers: BankingDealers.co.za This test is not yet approved or cleared by the [place] FDA and has been authorized for detection and/or diagnosis of SARS-CoV-2 by FDA under an Emergency Use Authorization (EUA).  This EUA will remain in effect (meaning this test can be used) for the duration of the COVID-19 declaration under Section 564(b)(1) of the Act, 21 U.S.C. section 360bbb-3(b)(1), unless the authorization is terminated or revoked sooner. Performed at [facility], [address] [place], [place] [address]     Coagulation Studies: Recent Labs    01/11/20 0339  LABPROT 13.6  INR 1.1    Urinalysis: No results for input(s): COLORURINE, LABSPEC, PHURINE, GLUCOSEU, HGBUR, BILIRUBINUR, KETONESUR, PROTEINUR, UROBILINOGEN, NITRITE, LEUKOCYTESUR in the last 72 hours.  Invalid input(s): APPERANCEUR    Imaging: US Venous Img Lower Unilateral Left  Result Date: 01/10/2020 CLINICAL DATA:  Left leg swelling. EXAM:  LEFT LOWER EXTREMITY VENOUS DOPPLER ULTRASOUND TECHNIQUE: Gray-scale sonography with graded compression, as well as color Doppler and duplex ultrasound were performed to evaluate the lower extremity deep venous systems from the level of the common femoral vein and including the common femoral, femoral, profunda femoral, popliteal and calf veins including the posterior tibial, peroneal and gastrocnemius veins when visible. The superficial great saphenous vein was also interrogated. Spectral Doppler was utilized to evaluate flow at rest and with distal augmentation maneuvers in the common femoral, femoral and popliteal veins. COMPARISON:  01/20/2017 FINDINGS: Contralateral Common Femoral Vein: Respiratory phasicity is normal and symmetric with the symptomatic side. No evidence of thrombus. Normal compressibility. Common Femoral Vein: No evidence of thrombus. Normal compressibility, respiratory phasicity and response to augmentation. Saphenofemoral Junction: No evidence of thrombus. Normal compressibility and flow on color Doppler imaging. Profunda Femoral Vein: No evidence of thrombus. Normal compressibility and flow on color Doppler imaging. Femoral Vein: No evidence of thrombus. Normal compressibility, respiratory phasicity and response to augmentation. Popliteal Vein: No evidence of thrombus. Normal compressibility, respiratory phasicity and response to augmentation. Calf Veins: Poorly visualized Superficial Great Saphenous Vein: No evidence of thrombus. Normal compressibility. Venous Reflux:  None. Other Findings:  None. IMPRESSION: 1. No  evidence of deep venous thrombosis involving the left femoral or popliteal veins. 2. 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 Electronically Signed   By: Logan Bores M.D.   On: 01/10/2020 15:32     Medications:   . cefTRIAXone (ROCEPHIN)  IV 1 g (01/11/20 1724)  . furosemide (LASIX)  infusion 4 mg/hr (01/11/20 0930)  . vancomycin 1,500 mg (01/11/20 1818)   . apixaban  5 mg Oral Q12H  . atorvastatin  10 mg Oral Daily  . carvedilol  6.25 mg Oral BID WC  . DULoxetine  120 mg Oral Daily  . fluticasone  1 spray Each Nare Daily  . hydrocerin   Topical BID  . insulin aspart  0-15 Units Subcutaneous TID WC  . insulin aspart  0-5 Units Subcutaneous QHS  . insulin detemir  8 Units Subcutaneous QHS  . multivitamin with minerals  1 tablet Oral Daily  . pantoprazole  40 mg Oral BID  . PARoxetine  40 mg Oral BH-q7a  . pramipexole  1 mg Oral BID  . sucralfate  1 g Oral TID  . triamcinolone cream   Topical BID  . umeclidinium-vilanterol  1 puff Inhalation Daily   ondansetron **OR** ondansetron (ZOFRAN) IV, oxyCODONE-acetaminophen, senna-docusate, tiZANidine  Assessment/ Plan:  74 y.o. male followed in our office with past medical history of diabetes mellitus, hyperlipidemia, hypertension, renal cell carcinoma status post left nephrectomy in March of 2004, gastric ulcers, COPD, chronic lower back pain, carpal tunnel syndrome, status post gastric bypass in 2010, LLE DVT, right sided PE, hx of right chest tube placement.   1.  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  Renal function appears to be relatively stable.  Creatinine currently 1.46 with a BUN of 27.  Continue to monitor renal parameters while on Lasix drip.  2.  Lower extremity edema.  Continue Lasix drip 1 additional day.  Consider stopping this tomorrow.  3.  Left lower extremity cellulitis with weeping of the skin.  Significantly improved with local wound care, elevation of the legs, diuretics, and vancomycin and ceftriaxone.    LOS: 2 [name] 1/28/20211:56 PM

## 2020-01-12 NOTE — Progress Notes (Signed)
PROGRESS NOTE    [name]  HQI:[id number] DOB: 08/06/1946 DOA: 01/10/2020 PCP: Derinda Late, MD   Brief Narrative:  74 y.o. male  with past medical history of diabetes mellitus, hyperlipidemia, hypertension, renal cell carcinoma status post left nephrectomy in March of 2004, gastric ulcers, COPD, chronic lower back pain, carpal tunnel syndrome, status post gastric bypass in 2010, LLE DVT, right sided PE, hx of right chest tube placement, presented to ED with worsening left lower extremity swelling and erythema.  Recently treated for left lower extremity cellulitis with Augmentin for 7 days.  His symptoms improved initially and then worsened again after discontinuation of antibiotic.  Was admitted for failed outpatient therapy.  Subjective: Patient was feeling better when seen this morning.  No new complaints.  Lower extremity swelling is improving.  Assessment & Plan:   Active Problems:   Cellulitis  Left lower extremity cellulitis.  No systemic infection, afebrile and no leukocytosis.  No purulent discharge.  Most likely secondary to chronic lower extremity edema.  He was treated with outpatient antibiotics with Augmentin for 1 week and responded during the treatment. Procalcitonin remained less than 0.10. Placed on IV ceftriaxone and vancomycin. -I will continue ceftriaxone and vancomycin during his stay in hospital, can be discharge on doxycycline once cleared from nephrology standpoint. -He was placed on Lasix gtt. with improvement in lower extremity edema, nephrology wants to continue Lasix gtt. today and most likely home tomorrow. -Wound care was consulted-appreciate their recommendations. -PT/OT evaluation. -Compression stockings-has tried Unna boot multiple times in the past.  Chronic kidney disease stage IIIa.  Mild worsening in his creatinine to 1.46 today.  Nephrology is following. -Continue with Lasix gtt. for 1 more day per nephrology.  -Monitor renal  function. -Avoid nephrotoxic  Iron deficiency anemia.  Being managed by hematology with IV iron infusions.  Last infusion was in December 2020. -Hemoglobin stable around 9.8.  Hyperlipidemia Continue home atorvastatin  Hypertension Continue home Coreg Lasix as above  Diabetes mellitus.  A1c of 7.2.  Mildly elevated CBG. Hold oral agents Increase Lantus to 10 units daily Moderate sliding scale.  Chronic back pain Followed by pain management Continue home outpatient pain regimen  Depression/anxiety Continue home Paxil Continue home Cymbalta  COPD Not in acute exacerbation Continue home fluticasone Patient substitution for Anoro Ellipta  Hidtory of PE.  No acute concern -Continue home dose of Eliquis.  Objective: Vitals:   01/11/20 0746 01/11/20 1513 01/12/20 0103 01/12/20 0803  BP: 130/68 109/69 113/65 125/68  Pulse: 71 64 66 70  Resp: 16 20 18 18   Temp: 97.8 F (36.6 C) (!) 97.4 F (36.3 C) 97.6 F (36.4 C) 98.1 F (36.7 C)  TempSrc: Oral Axillary Oral   SpO2: 98% 95% 96% 97%  Weight:      Height:        Intake/Output Summary (Last 24 hours) at 01/12/2020 1115 Last data filed at 01/12/2020 0945 Gross per 24 hour  Intake 1186.96 ml  Output 4075 ml  Net -2888.04 ml   Filed Weights   01/10/20 1244  Weight: 121 kg    Examination:  General exam: Appears calm and comfortable  Respiratory system: Clear to auscultation. Respiratory effort normal. Cardiovascular system: S1 & S2 heard, RRR. No JVD, murmurs, rubs, gallops or clicks. Gastrointestinal system: Soft, nontender, nondistended, bowel sounds positive. Central nervous system: Alert and oriented. No focal neurological deficits.Symmetric 5 x 5 power. Extremities: 1+ lower extremity edema, signs of chronic venous stasis dermatitis and some oozing, pulses  intact and symmetrical. Psychiatry: Judgement and insight appear normal. Mood & affect appropriate.    DVT prophylaxis: Eliquis Code Status:  Full Family Communication: No family at bedside. Disposition Plan: Pending improvement.  We will go back home.  Most likely tomorrow as nephrology wants to continue Lasix gtt. for today.  Consultants:   Nephrology  Procedures:  Antimicrobials:  Ceftriaxone Vancomycin  Data Reviewed: I have personally reviewed following labs and imaging studies  CBC: Recent Labs  Lab 01/10/20 1245 01/11/20 0339  WBC 7.4 5.4  NEUTROABS 5.4  --   HGB 9.8* 9.8*  HCT 32.9* 32.5*  MCV 89.4 88.1  PLT 239 453   Basic Metabolic Panel: Recent Labs  Lab 01/10/20 1245 01/11/20 0339 01/12/20 0623  NA 139 141 139  K 4.6 4.0 4.1  CL 114* 114* 108  CO2 18* 22 22  GLUCOSE 163* 158* 192*  BUN 30* 29* 27*  CREATININE 1.59* 1.31* 1.46*  CALCIUM 8.7* 8.8* 8.7*  PHOS  --   --  3.2   GFR: Estimated Creatinine Clearance: 62.4 mL/min (A) (by C-G formula based on SCr of 1.46 mg/dL (H)). Liver Function Tests: Recent Labs  Lab 01/10/20 1245 01/12/20 0623  AST 20  --   ALT 25  --   ALKPHOS 148*  --   BILITOT 0.4  --   PROT 6.3*  --   ALBUMIN 3.2* 3.0*   No results for input(s): LIPASE, AMYLASE in the last 168 hours. No results for input(s): AMMONIA in the last 168 hours. Coagulation Profile: Recent Labs  Lab 01/11/20 0339  INR 1.1   Cardiac Enzymes: No results for input(s): CKTOTAL, CKMB, CKMBINDEX, TROPONINI in the last 168 hours. BNP (last 3 results) No results for input(s): PROBNP in the last 8760 hours. HbA1C: Recent Labs    01/10/20 1245  HGBA1C 7.2*   CBG: Recent Labs  Lab 01/11/20 0831 01/11/20 1152 01/11/20 1654 01/11/20 2141 01/12/20 0800  GLUCAP 159* 239* 151* 209* 168*   Lipid Profile: No results for input(s): CHOL, HDL, LDLCALC, TRIG, CHOLHDL, LDLDIRECT in the last 72 hours. Thyroid Function Tests: No results for input(s): TSH, T4TOTAL, FREET4, T3FREE, THYROIDAB in the last 72 hours. Anemia Panel: No results for input(s): VITAMINB12, FOLATE, FERRITIN, TIBC,  IRON, RETICCTPCT in the last 72 hours. Sepsis Labs: Recent Labs  Lab 01/10/20 1245 01/11/20 0339 01/12/20 0623  PROCALCITON  --  <0.10 <0.10  LATICACIDVEN 0.6  --   --     No results found for this or any previous visit (from the past 240 hour(s)).   Radiology Studies: US Venous Img Lower Unilateral Left  Result Date: 01/10/2020 CLINICAL DATA:  Left leg swelling. EXAM: LEFT LOWER EXTREMITY VENOUS DOPPLER ULTRASOUND TECHNIQUE: Gray-scale sonography with graded compression, as well as color Doppler and duplex ultrasound were performed to evaluate the lower extremity deep venous systems from the level of the common femoral vein and including the common femoral, femoral, profunda femoral, popliteal and calf veins including the posterior tibial, peroneal and gastrocnemius veins when visible. The superficial great saphenous vein was also interrogated. Spectral Doppler was utilized to evaluate flow at rest and with distal augmentation maneuvers in the common femoral, femoral and popliteal veins. COMPARISON:  01/20/2017 FINDINGS: Contralateral Common Femoral Vein: Respiratory phasicity is normal and symmetric with the symptomatic side. No evidence of thrombus. Normal compressibility. Common Femoral Vein: No evidence of thrombus. Normal compressibility, respiratory phasicity and response to augmentation. Saphenofemoral Junction: No evidence of thrombus. Normal compressibility and flow on  color Doppler imaging. Profunda Femoral Vein: No evidence of thrombus. Normal compressibility and flow on color Doppler imaging. Femoral Vein: No evidence of thrombus. Normal compressibility, respiratory phasicity and response to augmentation. Popliteal Vein: No evidence of thrombus. Normal compressibility, respiratory phasicity and response to augmentation. Calf Veins: Poorly visualized Superficial Great Saphenous Vein: No evidence of thrombus. Normal compressibility. Venous Reflux:  None. Other Findings:  None. IMPRESSION:  1. No evidence of deep venous thrombosis involving the left femoral or popliteal veins. 2. 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 Electronically Signed   By: Logan Bores M.D.   On: 01/10/2020 15:32    Scheduled Meds: . apixaban  5 mg Oral Q12H  . atorvastatin  10 mg Oral Daily  . carvedilol  6.25 mg Oral BID WC  . DULoxetine  120 mg Oral Daily  . fluticasone  1 spray Each Nare Daily  . hydrocerin   Topical BID  . insulin aspart  0-15 Units Subcutaneous TID WC  . insulin aspart  0-5 Units Subcutaneous QHS  . insulin detemir  8 Units Subcutaneous QHS  . multivitamin with minerals  1 tablet Oral Daily  . pantoprazole  40 mg Oral BID  . PARoxetine  40 mg Oral BH-q7a  . pramipexole  1 mg Oral BID  . sucralfate  1 g Oral TID  . triamcinolone cream   Topical BID  . umeclidinium-vilanterol  1 puff Inhalation Daily   Continuous Infusions: . cefTRIAXone (ROCEPHIN)  IV 1 g (01/11/20 1724)  . furosemide (LASIX) infusion 4 mg/hr (01/11/20 0930)  . vancomycin 1,500 mg (01/11/20 1818)     LOS: 2 days   Time spent: 35 minutes  Lorella Nimrod, MD [facility] Pager [phone]  If 7PM-7AM, please contact night-coverage [email or web address] Password [facility] 01/12/2020, 11:15 AM   This record has been created using Systems analyst. Errors have been sought and corrected,but may not always be located. Such creation errors do not reflect on the standard of care.

## 2020-01-12 NOTE — Progress Notes (Signed)
Patients ankle is 6.5 knees is 15 inches and knee is 16.5 inches '

## 2020-01-13 LAB — CBC
HCT: 37.9 % — ABNORMAL LOW (ref 39.0–52.0)
Hemoglobin: 11.4 g/dL — ABNORMAL LOW (ref 13.0–17.0)
MCH: 26.3 pg (ref 26.0–34.0)
MCHC: 30.1 g/dL (ref 30.0–36.0)
MCV: 87.3 fL (ref 80.0–100.0)
Platelets: 277 10*3/uL (ref 150–400)
RBC: 4.34 MIL/uL (ref 4.22–5.81)
RDW: 17.3 % — ABNORMAL HIGH (ref 11.5–15.5)
WBC: 6.4 10*3/uL (ref 4.0–10.5)
nRBC: 0 % (ref 0.0–0.2)

## 2020-01-13 LAB — BASIC METABOLIC PANEL
Anion gap: 9 (ref 5–15)
BUN: 33 mg/dL — ABNORMAL HIGH (ref 8–23)
CO2: 25 mmol/L (ref 22–32)
Calcium: 8.8 mg/dL — ABNORMAL LOW (ref 8.9–10.3)
Chloride: 104 mmol/L (ref 98–111)
Creatinine, Ser: 1.8 mg/dL — ABNORMAL HIGH (ref 0.61–1.24)
GFR calc Af Amer: 43 mL/min — ABNORMAL LOW (ref >60)
GFR calc non Af Amer: 37 mL/min — ABNORMAL LOW (ref >60)
Glucose, Bld: 200 mg/dL — ABNORMAL HIGH (ref 70–99)
Potassium: 4.6 mmol/L (ref 3.5–5.1)
Sodium: 138 mmol/L (ref 135–145)

## 2020-01-13 LAB — GLUCOSE, CAPILLARY
Glucose-Capillary: 152 mg/dL — ABNORMAL HIGH (ref 70–99)
Glucose-Capillary: 179 mg/dL — ABNORMAL HIGH (ref 70–99)

## 2020-01-13 MED ORDER — INSULIN DETEMIR 100 UNIT/ML ~~LOC~~ SOLN
10.0000 [IU] | Freq: Every day | SUBCUTANEOUS | Status: DC
  Filled 2020-01-13: qty 0.1

## 2020-01-13 MED ORDER — INSULIN ASPART 100 UNIT/ML ~~LOC~~ SOLN
0.0000 [IU] | Freq: Three times a day (TID) | SUBCUTANEOUS | Status: DC
  Administered 2020-01-13 (×2): 2 [IU] via SUBCUTANEOUS
  Filled 2020-01-13 (×2): qty 1

## 2020-01-13 MED ORDER — CEFDINIR 300 MG PO CAPS: 300.0000 mg | ORAL_CAPSULE | Freq: Two times a day (BID) | ORAL | 0 refills | Status: DC

## 2020-01-13 MED ORDER — INSULIN ASPART 100 UNIT/ML ~~LOC~~ SOLN
3.0000 [IU] | Freq: Three times a day (TID) | SUBCUTANEOUS | Status: DC
  Administered 2020-01-13 (×2): 3 [IU] via SUBCUTANEOUS
  Filled 2020-01-13 (×2): qty 1

## 2020-01-13 NOTE — Discharge Summary (Signed)
Physician Discharge Summary  Richmond Coldren CZY:[id number] DOB: Apr 29, 1946 DOA: 01/10/2020  PCP: Derinda Late, MD  Admit date: 01/10/2020 Discharge date: 01/13/2020  Admitted From: Home  Disposition:  Home   Recommendations for Outpatient Follow-up:  1. Follow up with Wound Clinic in 1 week for [facility] removal 2. Please follow up with Dr. Holley Raring as soon as able 3. Dr. Holley Raring: Please obtain CBC in 1 week     Home Health: Noneo  Equipment/Devices: Unna boot  Discharge Condition: Fair  CODE STATUS: FULL Diet recommendation: Diabetic, cardiac  Brief/Interim Summary: [name] is a 74 y.o. 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  In the ER, afebrile, Cr 1.6, US duplex negative for DVT.  WBC 7K.    Started on broad spectrum antibiotics and admitted.            PRINCIPAL HOSPITAL DIAGNOSIS: Suspected cellulitis and severe venous insufficiency    Discharge Diagnoses:   Red leg syndrome Possible cellulitis Patient admitted and started on IV diuretics and empiric antibiotics.  Swelling/redness improved.  No purulent component.  Unna boots placed.  Extensive teaching done re: venous insufficiency ulcers, leg compression, salt restriction, diuretics and close management of ulcers.  Referred to [facility] in 1 week.   Iron defiency anemia Hgb stable.  Has gotten IV iron as outpatient due to ferritin <10.  Diabetes Continue glipizide  Chronic pain  Hypertension PVD secondary prevention Continue Coreg and atorvastatin  COPD not on home O2  History of VTE Continue Eliquis  Mood disorder Continue Cymbalta  CKD Stage IIIb Previous documentation of stage IIIa  incorrect.  Patient had gfr documented <45 over >6 months in [place].  Here, Cr stable in range 1.3-1.8 Close follow up Nephrology.             Discharge Instructions  Discharge Instructions    AMB referral to wound care center   Complete by: As directed    Diet - low sodium heart healthy   Complete by: As directed    Discharge instructions   Complete by: As directed    From Dr. Loleta Books: You were admitted with infection and swelling/edema in that left leg (and swelling in the right leg).  You were treated with strong diuretics (to fix the swelling) and with antibiotics (to treat the infection)  You should resume your home diuretic/fluid pill Lasix as before Go see Dr. Holley Raring in 1 or 2 weeks, we have arranged this appointment  Keep taking antibiotics, take cefdinir 300 mg twice daily for 7 days  We have applied the Unna boot and arranged an appointment next week at the wound care center. They can take it off and decide about applying another or not Bring your prescription stockings from [place] to that appointment to show them.  In the meantime, like we talked about: Bad vein circulation with infections is a chronic problem that does not get fixed and can only be managed well Even with good management, sometimes problems will happen but that is okay  Good management means: -Avoid salt (it goes straight to those legs) -Use good compression stockings to keep fluid out of the legs -Use diuretics under your kidney doctor's direction to keep fluid out of  those legs -When you have a new ulcer, have it checked by a wound specialist (like the wound care center or like Dr. Bunnie Domino office) asap, don't let it fester    Resume your other home meds  The Wound Care center is at [place], here by the hospital. Their contact info is below.   Increase activity slowly   Complete by: As directed      Allergies as of 01/13/2020   No Known Allergies     Medication List     TAKE these medications   atorvastatin 10 MG tablet Commonly known as: LIPITOR Take 10 mg by mouth daily.   carvedilol 6.25 MG tablet Commonly known as: COREG Take 6.25 mg by mouth 2 (two) times daily with a meal.   cefdinir 300 MG capsule Commonly known as: OMNICEF Take 1 capsule (300 mg total) by mouth 2 (two) times daily.   DULoxetine 60 MG capsule Commonly known as: CYMBALTA 120 mg daily.   Eliquis 5 MG Tabs tablet Generic drug: apixaban Take 5 mg by mouth every 12 (twelve) hours.   fluticasone 50 MCG/ACT nasal spray Commonly known as: FLONASE Place 1 spray into both nostrils 2 (two) times daily as needed for allergies or rhinitis.   furosemide 40 MG tablet Commonly known as: LASIX Take 40 mg by mouth 2 (two) times daily.   glipiZIDE 2.5 MG 24 hr tablet Commonly known as: GLUCOTROL XL Take 1 tablet (2.5 mg total) by mouth daily with breakfast.   Klor-Con M20 20 MEQ tablet Generic drug: potassium chloride SA Take 40 mEq by mouth daily.   multivitamin tablet Take 1 tablet by mouth daily.   oxyCODONE-acetaminophen 10-325 MG tablet Commonly known as: Percocet Take 1 tablet by mouth every 8 (eight) hours as needed for pain. Must last 30 days.   oxyCODONE-acetaminophen 10-325 MG tablet Commonly known as: Percocet Take 1 tablet by mouth every 8 (eight) hours as needed for pain. Must last 30 days. Start taking on: February 09, 2020   oxyCODONE-acetaminophen 10-325 MG tablet Commonly known as: Percocet Take 1 tablet by mouth every 8 (eight) hours as needed for pain. Must last 30 days. Start taking on: March 10, 2020   pantoprazole 40 MG tablet Commonly known as: PROTONIX Take 40 mg by mouth 2 (two) times daily.   pramipexole 1 MG tablet Commonly known as: MIRAPEX Take 1 mg by mouth 2 (two) times a day. Take one tablet by mouth at 2PM and oner tablet at bedtime   sucralfate 1 g tablet Commonly known as: CARAFATE Take 1 g by mouth 3 (three) times daily.    tiZANidine 2 MG tablet Commonly known as: ZANAFLEX Take 2 mg by mouth every 8 (eight) hours as needed for muscle spasms.      Follow-up Information    Holley Raring, [place], MD. Schedule an appointment as soon as possible for a visit in 1 week(s).   Specialty: Nephrology Contact information: [place] [address] [phone]        Jeri Cos Eday III, PA-C. Schedule an appointment as soon as possible for a visit on 01/20/2020.   Specialty: Physician Assistant Why: at [address] information: [place] [address] [phone]          No Known Allergies  Consultations:  Nephrology   Procedures/Studies: US Venous Img Lower Unilateral Left  Result Date: 01/10/2020 CLINICAL DATA:  Left leg swelling. EXAM: LEFT LOWER EXTREMITY VENOUS  DOPPLER ULTRASOUND TECHNIQUE: Gray-scale sonography with graded compression, as well as color Doppler and duplex ultrasound were performed to evaluate the lower extremity deep venous systems from the level of the common femoral vein and including the common femoral, femoral, profunda femoral, popliteal and calf veins including the posterior tibial, peroneal and gastrocnemius veins when visible. The superficial great saphenous vein was also interrogated. Spectral Doppler was utilized to evaluate flow at rest and with distal augmentation maneuvers in the common femoral, femoral and popliteal veins. COMPARISON:  01/20/2017 FINDINGS: Contralateral Common Femoral Vein: Respiratory phasicity is normal and symmetric with the symptomatic side. No evidence of thrombus. Normal compressibility. Common Femoral Vein: No evidence of thrombus. Normal compressibility, respiratory phasicity and response to augmentation. Saphenofemoral Junction: No evidence of thrombus. Normal compressibility and flow on color Doppler imaging. Profunda Femoral Vein: No evidence of thrombus. Normal compressibility and flow on color Doppler  imaging. Femoral Vein: No evidence of thrombus. Normal compressibility, respiratory phasicity and response to augmentation. Popliteal Vein: No evidence of thrombus. Normal compressibility, respiratory phasicity and response to augmentation. Calf Veins: Poorly visualized Superficial Great Saphenous Vein: No evidence of thrombus. Normal compressibility. Venous Reflux:  None. Other Findings:  None. IMPRESSION: 1. No evidence of deep venous thrombosis involving the left femoral or popliteal veins. 2. 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 Electronically Signed   By: Logan Bores M.D.   On: 01/10/2020 15:32       Subjective: Feeling well.  Swelling better.  Redness improved.  No fever.  Discharge Exam: Vitals:   01/12/20 2330 01/13/20 0843  BP: 118/70 121/65  Pulse: 72 84  Resp: 18 20  Temp: 98.1 F (36.7 C) 98.4 F (36.9 C)  SpO2: 96% 95%   Vitals:   01/12/20 0803 01/12/20 1611 01/12/20 2330 01/13/20 0843  BP: 125/68 106/65 118/70 121/65  Pulse: 70 78 72 84  Resp: 18 20 18 20   Temp: 98.1 F (36.7 C) 98.2 F (36.8 C) 98.1 F (36.7 C) 98.4 F (36.9 C)  TempSrc:  Oral Oral Oral  SpO2: 97% 100% 96% 95%  Weight:      Height:        General: Pt is alert, awake, not in acute distress Cardiovascular: RRR, nl S1-S2, no murmurs appreciated.   Moderate bilateral edema, worse on left, with redness, but improved from preivous.  Numerous venous insufficiency ulcers. Respiratory: Normal respiratory rate and rhythm.  CTAB without rales or wheezes. Abdominal: Abdomen soft and non-tender.  No distension or HSM.   Neuro/Psych: Strength symmetric in upper and lower extremities.  Judgment and insight appear normal.   The results of significant diagnostics from this hospitalization (including imaging, microbiology, ancillary and laboratory) are listed below for reference.     Microbiology: No results found  for this or any previous visit (from the past 240 hour(s)).   Labs: BNP (last 3 results) No results for input(s): BNP in the last 8760 hours. Basic Metabolic Panel: Recent Labs  Lab 01/10/20 1245 01/11/20 0339 01/12/20 0623 01/13/20 0520  NA 139 141 139 138  K 4.6 4.0 4.1 4.6  CL 114* 114* 108 104  CO2 18* 22 22 25   GLUCOSE 163* 158* 192* 200*  BUN 30* 29* 27* 33*  CREATININE 1.59* 1.31* 1.46* 1.80*  CALCIUM 8.7* 8.8* 8.7* 8.8*  PHOS  --   --  3.2  --    Liver Function Tests: Recent Labs  Lab  01/10/20 1245 01/12/20 0623  AST 20  --   ALT 25  --   ALKPHOS 148*  --   BILITOT 0.4  --   PROT 6.3*  --   ALBUMIN 3.2* 3.0*   No results for input(s): LIPASE, AMYLASE in the last 168 hours. No results for input(s): AMMONIA in the last 168 hours. CBC: Recent Labs  Lab 01/10/20 1245 01/11/20 0339 01/13/20 0520  WBC 7.4 5.4 6.4  NEUTROABS 5.4  --   --   HGB 9.8* 9.8* 11.4*  HCT 32.9* 32.5* 37.9*  MCV 89.4 88.1 87.3  PLT 239 213 277   Cardiac Enzymes: No results for input(s): CKTOTAL, CKMB, CKMBINDEX, TROPONINI in the last 168 hours. BNP: Invalid input(s): POCBNP CBG: Recent Labs  Lab 01/12/20 1209 01/12/20 1631 01/12/20 2128 01/13/20 0831 01/13/20 1153  GLUCAP 169* 175* 225* 152* 179*   D-Dimer No results for input(s): DDIMER in the last 72 hours. Hgb A1c No results for input(s): HGBA1C in the last 72 hours. Lipid Profile No results for input(s): CHOL, HDL, LDLCALC, TRIG, CHOLHDL, LDLDIRECT in the last 72 hours. Thyroid function studies No results for input(s): TSH, T4TOTAL, T3FREE, THYROIDAB in the last 72 hours.  Invalid input(s): FREET3 Anemia work up No results for input(s): VITAMINB12, FOLATE, FERRITIN, TIBC, IRON, RETICCTPCT in the last 72 hours. Urinalysis    Component Value Date/Time   COLORURINE STRAW (A) 01/24/2019 1250   APPEARANCEUR CLEAR (A) 01/24/2019 1250   APPEARANCEUR Clear 06/22/2014 2019   LABSPEC 1.010 01/24/2019 1250   LABSPEC  1.015 06/22/2014 2019   PHURINE 5.0 01/24/2019 1250   GLUCOSEU NEGATIVE 01/24/2019 1250   GLUCOSEU Negative 06/22/2014 2019   HGBUR SMALL (A) 01/24/2019 1250   BILIRUBINUR NEGATIVE 01/24/2019 1250   BILIRUBINUR Negative 06/22/2014 2019   KETONESUR NEGATIVE 01/24/2019 1250   PROTEINUR 30 (A) 01/24/2019 1250   NITRITE NEGATIVE 01/24/2019 1250   LEUKOCYTESUR NEGATIVE 01/24/2019 1250   LEUKOCYTESUR Negative 06/22/2014 2019   Sepsis Labs Invalid input(s): PROCALCITONIN,  WBC,  LACTICIDVEN Microbiology No results found for this or any previous visit (from the past 240 hour(s)).   Time coordinating discharge:35 minutes      SIGNED:   Edwin Dada, MD  [facility] 01/13/2020, 10:01 AM

## 2020-01-13 NOTE — TOC Transition Note (Signed)
Transition of Care [facility]) - CM/SW Discharge Note   Patient Details  Name: [name] MRN: [id number] Date of Birth: 09-Mar-1946  [facility]) CM/SW Contact:  Elease Hashimoto, LCSW Phone Number: 01/13/2020, 12:05 PM   Clinical Narrative:   Met with pt he is pleased to be leaving to go home today and back to his wife. He is aware of the Wound Clinic appt and will follow up with this at [facility]. He does have transportation via daughter or other family members to appointments. No other needs. Ready to discharge.    Final next level of care: Other (comment)(OP Wound Clinic) Barriers to Discharge: Barriers Resolved   Patient Goals and CMS Choice Patient states their goals for this hospitalization and ongoing recovery are:: Want to get home to my wife I was helping her at home before this      Discharge Placement                       Discharge Plan and Services In-house Referral: Clinical Social Work Discharge Planning Services: NA                                 Social Determinants of Health (SDOH) Interventions     Readmission Risk Interventions Readmission Risk Prevention Plan 07/08/2019  Transportation Screening Complete  PCP or Specialist Appt within 3-5 Days Complete  HRI or [place] Complete  Social Work Consult for [place] Planning/Counseling Complete  Palliative Care Screening Not Applicable  Medication Review [employer]) Complete  Some recent data might be hidden

## 2020-01-13 NOTE — Progress Notes (Signed)
[facility]  ROUNDING NOTE   Subjective:  Late entry. Patient seen prior to discharge. Wearing compression stockings this a.m.  Objective:  Vital signs in last 24 hours:  Temp:  [98.1 F (36.7 C)-98.4 F (36.9 C)] 98.4 F (36.9 C) (01/29 0843) Pulse Rate:  [72-84] 84 (01/29 0843) Resp:  [18-20] 20 (01/29 0843) BP: (106-121)/(65-70) 121/65 (01/29 0843) SpO2:  [95 %-100 %] 95 % (01/29 0843)  Weight change:  Filed Weights   01/10/20 1244  Weight: 121 kg    Intake/Output: I/O last 3 completed shifts: In: 37 [P.O.:480; I.V.:67; IV Piggyback:300] Out: 3500 [Urine:3500]   Intake/Output this shift:  Total I/O In: -  Out: 300 [Urine:300]  Physical Exam: General: No acute distress  Head: Normocephalic, atraumatic. Moist oral mucosal membranes  Eyes: Anicteric  Neck: Supple, trachea midline  Lungs:  Clear to auscultation, normal effort  Heart: S1S2 no rubs  Abdomen:  Soft, nontender, bowel sounds present  Extremities: 1+ bilateral lower extremity edema with left lower extremity cellulitis  Neurologic: Awake, alert, following commands  Skin: Improved cellulitis of the left lower extremity       Basic Metabolic Panel: Recent Labs  Lab 01/10/20 1245 01/10/20 1245 01/11/20 0339 01/12/20 0623 01/13/20 0520  NA 139  --  141 139 138  K 4.6  --  4.0 4.1 4.6  CL 114*  --  114* 108 104  CO2 18*  --  22 22 25   GLUCOSE 163*  --  158* 192* 200*  BUN 30*  --  29* 27* 33*  CREATININE 1.59*  --  1.31* 1.46* 1.80*  CALCIUM 8.7*   < > 8.8* 8.7* 8.8*  PHOS  --   --   --  3.2  --    < > = values in this interval not displayed.    Liver Function Tests: Recent Labs  Lab 01/10/20 1245 01/12/20 0623  AST 20  --   ALT 25  --   ALKPHOS 148*  --   BILITOT 0.4  --   PROT 6.3*  --   ALBUMIN 3.2* 3.0*   No results for input(s): LIPASE, AMYLASE in the last 168 hours. No results for input(s): AMMONIA in the last 168 hours.  CBC: Recent Labs  Lab  01/10/20 1245 01/11/20 0339 01/13/20 0520  WBC 7.4 5.4 6.4  NEUTROABS 5.4  --   --   HGB 9.8* 9.8* 11.4*  HCT 32.9* 32.5* 37.9*  MCV 89.4 88.1 87.3  PLT 239 213 277    Cardiac Enzymes: No results for input(s): CKTOTAL, CKMB, CKMBINDEX, TROPONINI in the last 168 hours.  BNP: Invalid input(s): POCBNP  CBG: Recent Labs  Lab 01/12/20 1209 01/12/20 1631 01/12/20 2128 01/13/20 0831 01/13/20 1153  GLUCAP 169* 175* 225* 152* 179*    Microbiology: Results for orders placed or performed during the hospital encounter of 07/05/19  SARS Coronavirus 2 (CEPHEID - Performed in [place] hospital lab), Hosp Order     Status: None   Collection Time: 07/05/19  1:37 PM   Specimen: Nasopharyngeal Swab  Result Value Ref Range Status   SARS Coronavirus 2 NEGATIVE NEGATIVE Final    Comment: (NOTE) If result is NEGATIVE SARS-CoV-2 target nucleic acids are NOT DETECTED. The SARS-CoV-2 RNA is generally detectable in upper and lower  respiratory specimens during the acute phase of infection. The lowest  concentration of SARS-CoV-2 viral copies this assay can detect is 250  copies / mL. A negative result does not preclude SARS-CoV-2  infection  and should not be used as the sole basis for treatment or other  patient management decisions.  A negative result may occur with  improper specimen collection / handling, submission of specimen other  than nasopharyngeal swab, presence of viral mutation(s) within the  areas targeted by this assay, and inadequate number of viral copies  (<250 copies / mL). A negative result must be combined with clinical  observations, patient history, and epidemiological information. If result is POSITIVE SARS-CoV-2 target nucleic acids are DETECTED. The SARS-CoV-2 RNA is generally detectable in upper and lower  respiratory specimens dur ing the acute phase of infection.  Positive  results are indicative of active infection with SARS-CoV-2.  Clinical  correlation  with patient history and other diagnostic information is  necessary to determine patient infection status.  Positive results do  not rule out bacterial infection or co-infection with other viruses. If result is PRESUMPTIVE POSTIVE SARS-CoV-2 nucleic acids MAY BE PRESENT.   A presumptive positive result was obtained on the submitted specimen  and confirmed on repeat testing.  While 2019 novel coronavirus  (SARS-CoV-2) nucleic acids may be present in the submitted sample  additional confirmatory testing may be necessary for epidemiological  and / or clinical management purposes  to differentiate between  SARS-CoV-2 and other Sarbecovirus currently known to infect humans.  If clinically indicated additional testing with an alternate test  methodology [phone]) is advised. The SARS-CoV-2 RNA is generally  detectable in upper and lower respiratory sp ecimens during the acute  phase of infection. The expected result is Negative. Fact Sheet for Patients:  StrictlyIdeas.no Fact Sheet for Healthcare Providers: BankingDealers.co.za This test is not yet approved or cleared by the [place] FDA and has been authorized for detection and/or diagnosis of SARS-CoV-2 by FDA under an Emergency Use Authorization (EUA).  This EUA will remain in effect (meaning this test can be used) for the duration of the COVID-19 declaration under Section 564(b)(1) of the Act, 21 U.S.C. section 360bbb-3(b)(1), unless the authorization is terminated or revoked sooner. Performed at [facility], [address] [place], [place] [address]     Coagulation Studies: Recent Labs    01/11/20 0339  LABPROT 13.6  INR 1.1    Urinalysis: No results for input(s): COLORURINE, LABSPEC, PHURINE, GLUCOSEU, HGBUR, BILIRUBINUR, KETONESUR, PROTEINUR, UROBILINOGEN, NITRITE, LEUKOCYTESUR in the last 72 hours.  Invalid input(s): APPERANCEUR    Imaging: No results  found.   Medications:   . cefTRIAXone (ROCEPHIN)  IV Stopped (01/12/20 1900)   . apixaban  5 mg Oral Q12H  . atorvastatin  10 mg Oral Daily  . carvedilol  6.25 mg Oral BID WC  . DULoxetine  120 mg Oral Daily  . fluticasone  1 spray Each Nare Daily  . hydrocerin   Topical BID  . insulin aspart  0-5 Units Subcutaneous QHS  . insulin aspart  0-9 Units Subcutaneous TID WC  . insulin aspart  3 Units Subcutaneous TID WC  . insulin detemir  10 Units Subcutaneous QHS  . multivitamin with minerals  1 tablet Oral Daily  . pantoprazole  40 mg Oral BID  . PARoxetine  40 mg Oral [id number]  . pramipexole  1 mg Oral BID  . sucralfate  1 g Oral TID  . triamcinolone cream   Topical BID  . umeclidinium-vilanterol  1 puff Inhalation Daily   ondansetron **OR** ondansetron (ZOFRAN) IV, oxyCODONE-acetaminophen, senna-docusate, tiZANidine  Assessment/ Plan:  74 y.o. male followed in our office with past  medical history of diabetes mellitus, hyperlipidemia, hypertension, renal cell carcinoma status post left nephrectomy in March of 2004, gastric ulcers, COPD, chronic lower back pain, carpal tunnel syndrome, status post gastric bypass in 2010, LLE DVT, right sided PE, hx of right chest tube placement.   1.  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  Creatinine was a bit higher however likely secondary to use of Lasix drip.  He will resume home dose of Lasix upon discharge.  2.  Lower extremity edema.  Lasix drip stopped today..  3.  Left lower extremity cellulitis with weeping of the skin.  Patient will continue with compression, leg elevation, and oral antibiotic therapy as an outpatient.    LOS: 3 Kathrynne Kulinski 1/29/20213:09 PM

## 2020-01-13 NOTE — Care Management Important Message (Signed)
Important Message  Patient Details  Name: [name] MRN: [id number] Date of Birth: 04-08-1946   Medicare Important Message Given:  Yes     Juliann Pulse A Dellamae Rosamilia 01/13/2020, 11:08 AM

## 2020-01-13 NOTE — Progress Notes (Signed)
Pt discharged per MD order. IV removed. Discharge instructions reviewed with pt. Pt verbalized understanding. Unna boot placed. Pt taken to car in wheelchair by staff.

## 2020-01-16 ENCOUNTER — Other Ambulatory Visit: Payer: Self-pay

## 2020-01-17 ENCOUNTER — Inpatient Hospital Stay: Payer: Medicare Other | Attending: Oncology

## 2020-01-17 ENCOUNTER — Other Ambulatory Visit: Payer: Self-pay

## 2020-01-17 ENCOUNTER — Inpatient Hospital Stay: Payer: Medicare Other

## 2020-01-17 VITALS — BP 133/73 | HR 87

## 2020-01-17 DIAGNOSIS — D509 Iron deficiency anemia, unspecified: Secondary | ICD-10-CM

## 2020-01-17 DIAGNOSIS — N189 Chronic kidney disease, unspecified: Secondary | ICD-10-CM | POA: Diagnosis present

## 2020-01-17 DIAGNOSIS — D631 Anemia in chronic kidney disease: Secondary | ICD-10-CM | POA: Diagnosis present

## 2020-01-17 LAB — CBC WITH DIFFERENTIAL/PLATELET
Abs Immature Granulocytes: 0.02 10*3/uL (ref 0.00–0.07)
Basophils Absolute: 0 10*3/uL (ref 0.0–0.1)
Basophils Relative: 1 %
Eosinophils Absolute: 0.2 10*3/uL (ref 0.0–0.5)
Eosinophils Relative: 3 %
HCT: 34.2 % — ABNORMAL LOW (ref 39.0–52.0)
Hemoglobin: 9.9 g/dL — ABNORMAL LOW (ref 13.0–17.0)
Immature Granulocytes: 0 %
Lymphocytes Relative: 17 %
Lymphs Abs: 1.1 10*3/uL (ref 0.7–4.0)
MCH: 25.9 pg — ABNORMAL LOW (ref 26.0–34.0)
MCHC: 28.9 g/dL — ABNORMAL LOW (ref 30.0–36.0)
MCV: 89.5 fL (ref 80.0–100.0)
Monocytes Absolute: 0.4 10*3/uL (ref 0.1–1.0)
Monocytes Relative: 6 %
Neutro Abs: 4.4 10*3/uL (ref 1.7–7.7)
Neutrophils Relative %: 73 %
Platelets: 246 10*3/uL (ref 150–400)
RBC: 3.82 MIL/uL — ABNORMAL LOW (ref 4.22–5.81)
RDW: 16.9 % — ABNORMAL HIGH (ref 11.5–15.5)
WBC: 6.1 10*3/uL (ref 4.0–10.5)
nRBC: 0 % (ref 0.0–0.2)

## 2020-01-17 LAB — IRON AND TIBC
Iron: 36 ug/dL — ABNORMAL LOW (ref 45–182)
Saturation Ratios: 9 % — ABNORMAL LOW (ref 17.9–39.5)
TIBC: 420 ug/dL (ref 250–450)
UIBC: 384 ug/dL

## 2020-01-17 LAB — FERRITIN: Ferritin: 12 ng/mL — ABNORMAL LOW (ref 24–336)

## 2020-01-17 MED ORDER — EPOETIN ALFA-EPBX 40000 UNIT/ML IJ SOLN
40000.0000 [IU] | Freq: Once | INTRAMUSCULAR | Status: AC
  Administered 2020-01-17: 12:00:00 40000 [IU] via SUBCUTANEOUS
  Filled 2020-01-17: qty 1

## 2020-01-20 ENCOUNTER — Ambulatory Visit: Payer: Medicare Other | Admitting: Physician Assistant

## 2020-01-23 ENCOUNTER — Encounter: Payer: Medicare Other | Attending: Physician Assistant | Admitting: Physician Assistant

## 2020-01-23 ENCOUNTER — Other Ambulatory Visit: Payer: Self-pay

## 2020-01-23 DIAGNOSIS — J449 Chronic obstructive pulmonary disease, unspecified: Secondary | ICD-10-CM | POA: Diagnosis not present

## 2020-01-23 DIAGNOSIS — I890 Lymphedema, not elsewhere classified: Secondary | ICD-10-CM | POA: Diagnosis not present

## 2020-01-23 DIAGNOSIS — E11622 Type 2 diabetes mellitus with other skin ulcer: Secondary | ICD-10-CM | POA: Diagnosis present

## 2020-01-23 DIAGNOSIS — L97822 Non-pressure chronic ulcer of other part of left lower leg with fat layer exposed: Secondary | ICD-10-CM | POA: Diagnosis not present

## 2020-01-23 DIAGNOSIS — C649 Malignant neoplasm of unspecified kidney, except renal pelvis: Secondary | ICD-10-CM | POA: Diagnosis not present

## 2020-01-23 DIAGNOSIS — I120 Hypertensive chronic kidney disease with stage 5 chronic kidney disease or end stage renal disease: Secondary | ICD-10-CM | POA: Diagnosis not present

## 2020-01-23 DIAGNOSIS — Z86718 Personal history of other venous thrombosis and embolism: Secondary | ICD-10-CM | POA: Diagnosis not present

## 2020-01-23 DIAGNOSIS — Z87891 Personal history of nicotine dependence: Secondary | ICD-10-CM | POA: Diagnosis not present

## 2020-01-23 DIAGNOSIS — E1122 Type 2 diabetes mellitus with diabetic chronic kidney disease: Secondary | ICD-10-CM | POA: Insufficient documentation

## 2020-01-23 DIAGNOSIS — N186 End stage renal disease: Secondary | ICD-10-CM | POA: Insufficient documentation

## 2020-01-23 NOTE — Progress Notes (Signed)
TOMOYA, RINGWALD ([id number]) Visit Report for 01/23/2020 Abuse/Suicide Risk Screen Details Patient Name: [name], [name] Date of Service: 01/23/2020 9:45 AM Medical Record Number: [id number] Patient Account Number: [id number] Date of Birth/Sex: 11-23-1946 (74 y.o. M) Treating RN: Montey Hora Primary Care Amando Chaput: Derinda Late Other Clinician: Referring Taivon Haroon: Myrene Buddy Treating Jerian Morais/Extender: [name], [name] Weeks in Treatment: 0 Abuse/Suicide Risk Screen Items Answer ABUSE RISK SCREEN: Has anyone close to you tried to hurt or harm you recentlyo No Do you feel uncomfortable with anyone in your familyo No Has anyone forced you do things that you didnot want to doo No Electronic Signature(s) Signed: 01/23/2020 4:23:54 PM By: Montey Hora Entered By: Montey Hora on 01/23/2020 10:17:18 Whiting, Dennie Maizes ([id number]) -------------------------------------------------------------------------------- Activities of Daily Living Details Patient Name: [name] Date of Service: 01/23/2020 9:45 AM Medical Record Number: [id number] Patient Account Number: [id number] Date of Birth/Sex: [birth year] (74 y.o. M) Treating RN: Montey Hora Primary Care Raffaella Edison: Derinda Late Other Clinician: Referring Kimberleigh Mehan: Myrene Buddy Treating Albertina Leise/Extender: [name], [name] Weeks in Treatment: 0 Activities of Daily Living Items Answer Activities of Daily Living (Please select one for each item) Drive Automobile Completely Able Take Medications Completely Able Use Telephone Completely Able Care for Appearance Completely Able Use Toilet Completely Able Bath / Shower Completely Able Dress Self Completely Able Feed Self Completely Able Walk Completely Able Get In / Out Bed Completely Able Housework Completely Able Prepare Meals Completely [place] for Self Completely Able Electronic Signature(s) Signed: 01/23/2020 4:23:54  PM By: Montey Hora Entered By: Montey Hora on 01/23/2020 10:17:39 Mulkern, Dennie Maizes ([id number]) -------------------------------------------------------------------------------- Education Screening Details Patient Name: [name] Date of Service: 01/23/2020 9:45 AM Medical Record Number: [id number] Patient Account Number: [id number] Date of Birth/Sex: [birth year] (73 y.o. M) Treating RN: Montey Hora Primary Care Iyan Flett: Derinda Late Other Clinician: Referring Saiquan Hands: Myrene Buddy Treating Aneliz Carbary/Extender: Melburn Hake, [name] Weeks in Treatment: 0 Primary Learner Assessed: Patient Learning Preferences/Education Level/Primary Language Learning Preference: Explanation, Demonstration Highest Education Level: College or Above Preferred Language: English Cognitive Barrier Language Barrier: No Translator Needed: No Memory Deficit: No Emotional Barrier: No Cultural/Religious Beliefs Affecting Medical Care: No Physical Barrier Impaired Vision: No Impaired Hearing: No Decreased Hand dexterity: No Knowledge/Comprehension Knowledge Level: Medium Comprehension Level: Medium Ability to understand written Medium instructions: Ability to understand verbal Medium instructions: Motivation Anxiety Level: Calm Cooperation: Cooperative Education Importance: Acknowledges Need Interest in Health Problems: Asks Questions Perception: Coherent Willingness to Engage in Self- Medium Management Activities: Readiness to Engage in Self- Medium Management Activities: Electronic Signature(s) Signed: 01/23/2020 4:23:54 PM By: Montey Hora Entered By: Montey Hora on 01/23/2020 10:17:59 Geimer, Dennie Maizes ([id number]) -------------------------------------------------------------------------------- Fall Risk Assessment Details Patient Name: [name] Date of Service: 01/23/2020 9:45 AM Medical Record Number: [id number] Patient Account Number: [id number] Date  of Birth/Sex: October 01, 1946 (73 y.o. M) Treating RN: Montey Hora Primary Care Jermiah Howton: BABAOFF, MARCUS Other Clinician: Referring Betti Goodenow: Myrene Buddy Treating Jakelin Taussig/Extender: [name], [name] Weeks in Treatment: 0 Fall Risk Assessment Items Have you had 2 or more falls in the last 12 monthso 0 Yes Have you had any fall that resulted in injury in the last 12 monthso 0 No FALLS RISK SCREEN History of falling - immediate or within 3 months 0 No Secondary diagnosis (Do you have 2 or more medical diagnoseso) 0 No Ambulatory aid None/bed rest/wheelchair/nurse 0 No Crutches/cane/walker 15 Yes Furniture 0 No Intravenous therapy Access/Saline/Heparin Lock 0 No Gait/Transferring Normal/ bed rest/ wheelchair 0 No  Weak (short steps with or without shuffle, stooped but able to lift head while 10 Yes walking, may seek support from furniture) Impaired (short steps with shuffle, may have difficulty arising from chair, head 0 No down, impaired balance) Mental Status Oriented to own ability 0 Yes Electronic Signature(s) Signed: 01/23/2020 4:23:54 PM By: Montey Hora Entered By: Montey Hora on 01/23/2020 10:18:31 Dileo, Dennie Maizes ([id number]) -------------------------------------------------------------------------------- Foot Assessment Details Patient Name: [name] Date of Service: 01/23/2020 9:45 AM Medical Record Number: [id number] Patient Account Number: [id number] Date of Birth/Sex: [birth year] (73 y.o. M) Treating RN: Montey Hora Primary Care Cesily Cuoco: BABAOFF, MARCUS Other Clinician: Referring Tevita Gomer: Myrene Buddy Treating Kenady Doxtater/Extender: [name], [name] Weeks in Treatment: 0 Foot Assessment Items Site Locations + = Sensation present, - = Sensation absent, C = Callus, U = Ulcer R = Redness, W = Warmth, M = Maceration, PU = Pre-ulcerative lesion F = Fissure, S = Swelling, D = Dryness Assessment Right: Left: Other Deformity: No No Prior  Foot Ulcer: No No Prior Amputation: No No Charcot Joint: No No Ambulatory Status: Ambulatory With Help Assistance Device: Cane Gait: Administrator, arts) Signed: 01/23/2020 4:23:54 PM By: Montey Hora Entered By: Montey Hora on 01/23/2020 10:26:46 Brandes, Dennie Maizes ([id number]) -------------------------------------------------------------------------------- Nutrition Risk Screening Details Patient Name: [name] Date of Service: 01/23/2020 9:45 AM Medical Record Number: [id number] Patient Account Number: [id number] Date of Birth/Sex: [birth year] (74 y.o. M) Treating RN: Montey Hora Primary Care Tarique Loveall: BABAOFF, MARCUS Other Clinician: Referring Belva Koziel: Myrene Buddy Treating Yukio Bisping/Extender: [name], [name] Weeks in Treatment: 0 Height (in): 72 Weight (lbs): 280 Body Mass Index (BMI): 38 Nutrition Risk Screening Items Score Screening NUTRITION RISK SCREEN: I have an illness or condition that made me change the kind and/or amount of 0 No food I eat I eat fewer than two meals per day 0 No I eat few fruits and vegetables, or milk products 0 No I have three or more drinks of beer, liquor or wine almost every day 0 No I have tooth or mouth problems that make it hard for me to eat 0 No I don't always have enough money to buy the food I need 0 No I eat alone most of the time 0 No I take three or more different prescribed or over-the-counter drugs a day 1 Yes Without wanting to, I have lost or gained 10 pounds in the last six months 0 No I am not always physically able to shop, cook and/or feed myself 0 No Nutrition Protocols Good Risk Protocol 0 No interventions needed Moderate Risk Protocol High Risk Proctocol Risk Level: Good Risk Score: 1 Electronic Signature(s) Signed: 01/23/2020 4:23:54 PM By: Montey Hora Entered By: Montey Hora on 01/23/2020 10:18:38

## 2020-01-24 NOTE — Progress Notes (Signed)
[name], [name] ([id number]) Visit Report for 01/23/2020 Chief Complaint Document Details Patient Name: [name], [name] Date of Service: 01/23/2020 9:45 AM Medical Record Number: [id number] Patient Account Number: [id number] Date of Birth/Sex: 12-25-1945 (74 y.o. M) Treating RN: Cornell Barman Primary Care Provider: Derinda Late Other Clinician: Referring Provider: Myrene Buddy Treating Provider/Extender: Melburn Hake, Kuuipo Anzaldo Weeks in Treatment: 0 Information Obtained from: Patient Chief Complaint Left LE Ulcer Electronic Signature(s) Signed: 01/23/2020 10:44:59 AM By: Worthy Keeler PA-C Entered By: Worthy Keeler on 01/23/2020 10:44:58 [name], [name] ([id number]) -------------------------------------------------------------------------------- HPI Details Patient Name: [name] Date of Service: 01/23/2020 9:45 AM Medical Record Number: [id number] Patient Account Number: [id number] Date of Birth/Sex: 08/22/1946 (74 y.o. M) Treating RN: Cornell Barman Primary Care Provider: Derinda Late Other Clinician: Referring Provider: Myrene Buddy Treating Provider/Extender: Melburn Hake, Tarryn Bogdan Weeks in Treatment: 0 History of Present Illness HPI Description: 01/23/2020 upon evaluation today patient appears to be doing decently well in regard to his bilateral lower extremities. He does unfortunately have a history significant for lower extremity lymphedema, hypertension, and diabetes mellitus type 2. His most recent hemoglobin A1c was 7.2 that was on 01/10/2020. With that being said he tells me that his legs were actually tremendously larger than what they are now prior to having been admitted to the hospital where he was placed on antibiotics and IV Lasix to diurese. It was his vascular physician, Dr. Lucky Cowboy, who referred the patient to his nephrologist to get this started. Nonetheless they admitted him to the hospital and he tells me that post the hospital stay he has been  doing much better he was discharged about a week and a half ago and they had a wrap on him they told him to keep in place until he came here to see me today. Currently there does appear to be some drainage at the site where he has the wound although the wound itself may have completely closed up were not entirely sure here to be perfectly honest that there could be still something leaking especially in light of the lymphedema. Either way I think that he is in a likely benefit from [place] monitoring this for at least a week prior to discharging him is healed I do not want anything to reopen and cause problems like what he was having previous. He does have compression socks that he got from [place] socks here in [facility] and fortunately they seem to be of good quality there 20-30 mmHg which is perfect. Electronic Signature(s) Signed: 01/23/2020 5:25:24 PM By: Worthy Keeler PA-C Entered By: Worthy Keeler on 01/23/2020 17:25:24 [name], [name] ([id number]) -------------------------------------------------------------------------------- Physical Exam Details Patient Name: [name] Date of Service: 01/23/2020 9:45 AM Medical Record Number: [id number] Patient Account Number: [id number] Date of Birth/Sex: 12-11-1946 (74 y.o. M) Treating RN: Cornell Barman Primary Care Provider: Derinda Late Other Clinician: Referring Provider: Myrene Buddy Treating Provider/Extender: [name], Ulyses Panico Weeks in Treatment: 0 Constitutional sitting or standing blood pressure is within target range for patient.. pulse regular and within target range for patient.Marland Kitchen respirations regular, non-labored and within target range for patient.Marland Kitchen temperature within target range for patient.. Well- nourished and well-hydrated in no acute distress. Eyes conjunctiva clear no eyelid edema noted. pupils equal round and reactive to light and accommodation. Ears, Nose, Mouth, and Throat no gross  abnormality of ear auricles or external auditory canals. normal hearing noted during conversation. mucus membranes moist. Respiratory normal breathing without difficulty. Cardiovascular 2+ dorsalis pedis/posterior tibialis  pulses. no clubbing, cyanosis, significant edema, <3 sec cap refill. Gastrointestinal (GI) soft, non-tender, non-distended, +BS. no ventral hernia noted. Musculoskeletal normal gait and posture. no significant deformity or arthritic changes, no loss or range of motion, no clubbing. Psychiatric this patient is able to make decisions and demonstrates good insight into disease process. Alert and Oriented x 3. pleasant and cooperative. Notes Upon inspection patient's wound bed actually showed signs of good granulation at this time. Fortunately there is no signs of active infection which is also good news. There does not appear to be any evidence of systemic infection which is good news as well. I think that his edema is much better controlled than it was prior to going into the hospital his blood pressure is doing excellent overall I am rather pleased with the picture that we are seeing today. Electronic Signature(s) Signed: 01/23/2020 5:26:06 PM By: Worthy Keeler PA-C Entered By: Worthy Keeler on 01/23/2020 17:26:06 [name], [name] ([id number]) -------------------------------------------------------------------------------- Physician Orders Details Patient Name: [name] Date of Service: 01/23/2020 9:45 AM Medical Record Number: [id number] Patient Account Number: [id number] Date of Birth/Sex: 1946-02-11 (74 y.o. M) Treating RN: Cornell Barman Primary Care Provider: Derinda Late Other Clinician: Referring Provider: Myrene Buddy Treating Provider/Extender: [name], Melenda Bielak Weeks in Treatment: 0 Verbal / Phone Orders: No Diagnosis Coding ICD-10 Coding Code Description I89.0 Lymphedema, not elsewhere classified L97.822 Non-pressure chronic ulcer of  other part of left lower leg with fat layer exposed I10 Essential (primary) hypertension E11.622 Type 2 diabetes mellitus with other skin ulcer Wound Cleansing Wound #1 Left,Anterior Lower Leg o Clean wound with Normal Saline. Follow-up Appointments Wound #1 Left,Anterior Lower Leg o Return Appointment in 1 week. Edema Control Wound #1 Left,Anterior Lower Leg o Patient to wear own compression stockings o Elevate legs to the level of the heart and pump ankles as often as possible o Compression Pump: Use compression pump on left lower extremity for 60 minutes, twice daily. o Compression Pump: Use compression pump on right lower extremity for 60 minutes, twice daily. Electronic Signature(s) Signed: 01/23/2020 5:41:25 PM By: Worthy Keeler PA-C Signed: 01/24/2020 4:33:39 PM By: Gretta Cool BSN, RN, CWS, Kim RN, BSN Entered By: Gretta Cool, BSN, RN, CWS, Kim on 01/23/2020 10:50:31 [name], [name] ([id number]) -------------------------------------------------------------------------------- Problem List Details Patient Name: [name] Date of Service: 01/23/2020 9:45 AM Medical Record Number: [id number] Patient Account Number: [id number] Date of Birth/Sex: 10/13/1946 (74 y.o. M) Treating RN: Cornell Barman Primary Care Provider: Derinda Late Other Clinician: Referring Provider: Myrene Buddy Treating Provider/Extender: Melburn Hake, Evelynn Hench Weeks in Treatment: 0 Active Problems ICD-10 Evaluated Encounter Code Description Active Date Today Diagnosis I89.0 Lymphedema, not elsewhere classified 01/23/2020 No Yes L97.822 Non-pressure chronic ulcer of other part of left lower leg with 01/23/2020 No Yes fat layer exposed I10 Essential (primary) hypertension 01/23/2020 No Yes E11.622 Type 2 diabetes mellitus with other skin ulcer 01/23/2020 No Yes Inactive Problems Resolved Problems Electronic Signature(s) Signed: 01/23/2020 10:44:12 AM By: Worthy Keeler PA-C Entered By: Worthy Keeler  on 01/23/2020 10:44:12 [name], [name] ([id number]) -------------------------------------------------------------------------------- Progress Note Details Patient Name: [name] Date of Service: 01/23/2020 9:45 AM Medical Record Number: [id number] Patient Account Number: [id number] Date of Birth/Sex: 28-Oct-1946 (74 y.o. M) Treating RN: Cornell Barman Primary Care Provider: Derinda Late Other Clinician: Referring Provider: Myrene Buddy Treating Provider/Extender: Melburn Hake, Anand Tejada Weeks in Treatment: 0 Subjective Chief Complaint Information obtained from Patient Left LE Ulcer History of Present Illness (HPI) 01/23/2020 upon evaluation today  patient appears to be doing decently well in regard to his bilateral lower extremities. He does unfortunately have a history significant for lower extremity lymphedema, hypertension, and diabetes mellitus type 2. His most recent hemoglobin A1c was 7.2 that was on 01/10/2020. With that being said he tells me that his legs were actually tremendously larger than what they are now prior to having been admitted to the hospital where he was placed on antibiotics and IV Lasix to diurese. It was his vascular physician, Dr. Lucky Cowboy, who referred the patient to his nephrologist to get this started. Nonetheless they admitted him to the hospital and he tells me that post the hospital stay he has been doing much better he was discharged about a week and a half ago and they had a wrap on him they told him to keep in place until he came here to see me today. Currently there does appear to be some drainage at the site where he has the wound although the wound itself may have completely closed up were not entirely sure here to be perfectly honest that there could be still something leaking especially in light of the lymphedema. Either way I think that he is in a likely benefit from [place] monitoring this for at least a week prior to discharging him is healed I do not  want anything to reopen and cause problems like what he was having previous. He does have compression socks that he got from [place] socks here in [facility] and fortunately they seem to be of good quality there 20-30 mmHg which is perfect. Patient History Information obtained from Patient. Allergies No Known Drug Allergies Family History Diabetes - Mother, No family history of Cancer, Heart Disease, Hereditary Spherocytosis, Hypertension, Kidney Disease, Lung Disease, Seizures, Stroke, Thyroid Problems, Tuberculosis. Social History Former smoker - 7 years, Marital Status - Married, Alcohol Use - Never, Drug Use - No History, Caffeine Use - Never. Medical History Hematologic/Lymphatic Patient has history of Lymphedema Denies history of Anemia, Hemophilia, Human Immunodeficiency Virus, Sickle Cell Disease Respiratory Patient has history of Chronic Obstructive Pulmonary Disease (COPD) Denies history of Aspiration, Asthma, Pneumothorax, Sleep Apnea, Tuberculosis Cardiovascular Patient has history of Deep Vein Thrombosis, Hypertension, Peripheral Venous Disease Denies history of Angina, Arrhythmia, Congestive Heart Failure, Coronary Artery Disease, Hypotension, Myocardial Infarction, Peripheral Arterial Disease, Phlebitis, Vasculitis Inabinet, Curtis S. 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 Medical And Surgical History Notes Oncologic renal cell cancer with no treatment Review of Systems (ROS) Constitutional Symptoms (General Health) Denies complaints or symptoms of Fatigue, Fever, Chills, Marked Weight Change. Eyes Denies complaints or symptoms of Dry  Eyes, Vision Changes, Glasses / Contacts. Ear/Nose/Mouth/Throat Denies complaints or symptoms of Difficult clearing ears, Sinusitis. Hematologic/Lymphatic Denies complaints or symptoms of Bleeding / Clotting Disorders, Human Immunodeficiency Virus. Respiratory Denies complaints or symptoms of Chronic or frequent coughs, Shortness of Breath. Cardiovascular Complains or has symptoms of LE edema. Denies complaints or symptoms of Chest pain. Gastrointestinal Denies complaints or symptoms of Frequent diarrhea, Nausea, Vomiting. Endocrine Denies complaints or symptoms of Hepatitis, Thyroid disease, Polydypsia (Excessive Thirst). Genitourinary Complains or has symptoms of Kidney failure/ Dialysis - CKD 3. Denies complaints or symptoms of Incontinence/dribbling. Immunological Denies complaints or symptoms of Hives,  Itching. Integumentary (Skin) Complains or has symptoms of Wounds. Denies complaints or symptoms of Bleeding or bruising tendency, Breakdown, Swelling. Musculoskeletal Denies complaints or symptoms of Muscle Pain, Muscle Weakness. Neurologic Denies complaints or symptoms of Numbness/parasthesias, Focal/Weakness. Psychiatric Denies complaints or symptoms of Anxiety, Claustrophobia. Objective [name], Curtis S. ([id number]) Constitutional sitting or standing blood pressure is within target range for patient.. pulse regular and within target range for patient.Marland Kitchen respirations regular, non-labored and within target range for patient.Marland Kitchen temperature within target range for patient.. Well- nourished and well-hydrated in no acute distress. Vitals Time Taken: 10:04 AM, Height: 72 in, Source: Measured, Weight: 280 lbs, Source: Measured, BMI: 38, Temperature: 98.6 F, Pulse: 92 bpm, Respiratory Rate: 18 breaths/min, Blood Pressure: 127/57 mmHg. Eyes conjunctiva clear no eyelid edema noted. pupils equal round and reactive to light and accommodation. Ears, Nose, Mouth, and Throat no gross  abnormality of ear auricles or external auditory canals. normal hearing noted during conversation. mucus membranes moist. Respiratory normal breathing without difficulty. Cardiovascular 2+ dorsalis pedis/posterior tibialis pulses. no clubbing, cyanosis, significant edema, Gastrointestinal (GI) soft, non-tender, non-distended, +BS. no ventral hernia noted. Musculoskeletal normal gait and posture. no significant deformity or arthritic changes, no loss or range of motion, no clubbing. Psychiatric this patient is able to make decisions and demonstrates good insight into disease process. Alert and Oriented x 3. pleasant and cooperative. General Notes: Upon inspection patient's wound bed actually showed signs of good granulation at this time. Fortunately there is no signs of active infection which is also good news. There does not appear to be any evidence of systemic infection which is good news as well. I think that his edema is much better controlled than it was prior to going into the hospital his blood pressure is doing excellent overall I am rather pleased with the picture that we are seeing today. Integumentary (Hair, Skin) Wound #1 status is Open. Original cause of wound was Blister. The wound is located on the Left,Anterior Lower Leg. The wound measures 0.2cm length x 0.3cm width x 0.1cm depth; 0.047cm^2 area and 0.005cm^3 volume. There is Fat Layer (Subcutaneous Tissue) Exposed exposed. There is no tunneling or undermining noted. There is a medium amount of serous drainage noted. The wound margin is flat and intact. There is large (67-100%) pink granulation within the wound bed. There is a small (1-33%) amount of necrotic tissue within the wound bed including Adherent Slough. Assessment Active Problems ICD-10 Lymphedema, not elsewhere classified Non-pressure chronic ulcer of other part of left lower leg with fat layer exposed Essential (primary) hypertension Type 2 diabetes mellitus  with other skin ulcer [name], Curtis S. ([id number]) Plan Wound Cleansing: Wound #1 Left,Anterior Lower Leg: Clean wound with Normal Saline. Follow-up Appointments: Wound #1 Left,Anterior Lower Leg: Return Appointment in 1 week. Edema Control: Wound #1 Left,Anterior Lower Leg: Patient to wear own compression stockings Elevate legs to the level of the heart and pump ankles as often as possible Compression Pump: Use compression pump on left lower extremity for 60 minutes, twice daily. Compression Pump: Use compression pump on right lower extremity for 60 minutes, twice daily. 1. I would recommend currently that we actually initiate having him use his own compression stockings here over the next week and see how things do. If he has no drainage no issues then we can likely discharge him next week if healed with no further complication. If he is not doing better by that time or if he has anything occur in the meantime he will contact the  office and let me know. 2. I do recommend that he continue to use his lymphedema pumps 2 times a day those were previously prescribed by Dr. Lucky Cowboy his vascular surgeon and I think these are definitely appropriate. 3. I am also going to recommend currently that he continue to monitor for any signs of infection. I do think he can wear his 20- 30 mmHg compression socks for now and is always is not having any weeping or leaking I think this will be appropriate he will continue as such. He did get 3 appears he probably needs to get a few more as his older pairs are greater than 6 months old and likely should be replaced as well. We will see patient back for reevaluation in 1 week here in the clinic. If anything worsens or changes patient will contact our office for additional recommendations. Electronic Signature(s) Signed: 01/23/2020 5:27:25 PM By: Worthy Keeler PA-C Entered By: Worthy Keeler on 01/23/2020 17:27:24 [name], [name]  ([id number]) -------------------------------------------------------------------------------- ROS/PFSH Details Patient Name: [name] Date of Service: 01/23/2020 9:45 AM Medical Record Number: [id number] Patient Account Number: [id number] Date of Birth/Sex: 1945-12-31 (74 y.o. M) Treating RN: Montey Hora Primary Care Provider: Derinda Late Other Clinician: Referring Provider: Myrene Buddy Treating Provider/Extender: [name], Brinlynn Gorton Weeks in Treatment: 0 Information Obtained From Patient Constitutional Symptoms (General Health) Complaints and Symptoms: Negative for: Fatigue; Fever; Chills; Marked Weight Change Eyes Complaints and Symptoms: Negative for: Dry Eyes; Vision Changes; Glasses / Contacts Ear/Nose/Mouth/Throat Complaints and Symptoms: Negative for: Difficult clearing ears; Sinusitis Hematologic/Lymphatic Complaints and Symptoms: Negative for: Bleeding / Clotting Disorders; Human Immunodeficiency Virus Medical History: Positive for: Lymphedema Negative for: Anemia; Hemophilia; Human Immunodeficiency Virus; Sickle Cell Disease Respiratory Complaints and Symptoms: Negative for: Chronic or frequent coughs; Shortness of Breath Medical History: Positive for: Chronic Obstructive Pulmonary Disease (COPD) Negative for: Aspiration; Asthma; Pneumothorax; Sleep Apnea; Tuberculosis Cardiovascular Complaints and Symptoms: Positive for: LE edema Negative for: Chest pain Medical History: Positive for: Deep Vein Thrombosis; Hypertension; Peripheral Venous Disease Negative for: Angina; Arrhythmia; Congestive Heart Failure; Coronary Artery Disease; Hypotension; Myocardial Infarction; Peripheral Arterial Disease; Phlebitis; Vasculitis Gastrointestinal [name], Curtis S. ([id number]) Complaints and Symptoms: Negative for: Frequent diarrhea; Nausea; Vomiting Endocrine Complaints and Symptoms: Negative for: Hepatitis; Thyroid disease; Polydypsia (Excessive  Thirst) Medical History: Positive for: Type II Diabetes Negative for: Type I Diabetes Treated with: Oral agents Genitourinary Complaints and Symptoms: Positive for: Kidney failure/ Dialysis - CKD 3 Negative for: Incontinence/dribbling Medical History: Positive for: End Stage Renal Disease - CKD 3 Immunological Complaints and Symptoms: Negative for: Hives; Itching Integumentary (Skin) Complaints and Symptoms: Positive for: Wounds Negative for: Bleeding or bruising tendency; Breakdown; Swelling Medical History: Negative for: History of Burn; History of pressure wounds Musculoskeletal Complaints and Symptoms: Negative for: Muscle Pain; Muscle Weakness Neurologic Complaints and Symptoms: Negative for: Numbness/parasthesias; Focal/Weakness Psychiatric Complaints and Symptoms: Negative for: Anxiety; Claustrophobia Oncologic Medical History: Negative for: Received Chemotherapy; Received Radiation Past Medical History Notes: renal cell cancer with no treatment [name], Curtis S. ([id number]) Immunizations Pneumococcal Vaccine: Received Pneumococcal Vaccination: Yes Immunization Notes: up to date Implantable Devices None Family and Social History Cancer: No; Diabetes: Yes - Mother; Heart Disease: No; Hereditary Spherocytosis: No; Hypertension: No; Kidney Disease: No; Lung Disease: No; Seizures: No; Stroke: No; Thyroid Problems: No; Tuberculosis: No; Former smoker - 7 years; Marital Status - Married; Alcohol Use: Never; Drug Use: No History; Caffeine Use: Never; Financial Concerns: No; Food, Clothing or Shelter Needs: No; Support System Lacking: No; Transportation  Concerns: No Electronic Signature(s) Signed: 01/23/2020 4:23:54 PM By: Montey Hora Signed: 01/23/2020 5:41:25 PM By: Worthy Keeler PA-C Entered By: Montey Hora on 01/23/2020 10:17:11 [name], [name] ([id number]) -------------------------------------------------------------------------------- SuperBill  Details Patient Name: [name] Date of Service: 01/23/2020 Medical Record Number: [id number] Patient Account Number: [id number] Date of Birth/Sex: Mar 09, 1946 (74 y.o. M) Treating RN: Cornell Barman Primary Care Provider: Derinda Late Other Clinician: Referring Provider: Myrene Buddy Treating Provider/Extender: Melburn Hake, Ronni Osterberg Weeks in Treatment: 0 Diagnosis Coding ICD-10 Codes Code Description I89.0 Lymphedema, not elsewhere classified L97.822 Non-pressure chronic ulcer of other part of left lower leg with fat layer exposed I10 Essential (primary) hypertension E11.622 Type 2 diabetes mellitus with other skin ulcer Facility Procedures CPT4 Code: [id number] Description: 99214 - WOUND CARE VISIT-LEV 4 EST PT Modifier: Quantity: 1 Physician Procedures CPT4 Code Description: [id number] WC PHYS LEVEL 3 o NEW PT ICD-10 Diagnosis Description I89.0 Lymphedema, not elsewhere classified L97.822 Non-pressure chronic ulcer of other part of left lower leg wi I10 Essential (primary) hypertension E11.622 Type 2  diabetes mellitus with other skin ulcer Modifier: th fat layer expos Quantity: 1 ed Electronic Signature(s) Signed: 01/23/2020 5:36:26 PM By: Worthy Keeler PA-C Entered By: Worthy Keeler on 01/23/2020 17:36:26

## 2020-01-24 NOTE — Progress Notes (Signed)
JEVIN, CAMINO ([id number]) Visit Report for 01/23/2020 Allergy List Details Patient Name: FREDDIE, DYMEK Date of Service: 01/23/2020 9:45 AM Medical Record Number: [id number] Patient Account Number: [id number] Date of Birth/Sex: 1946-04-12 (74 y.o. M) Treating RN: Montey Hora Primary Care Taylee Gunnells: Derinda Late Other Clinician: Referring Adylene Dlugosz: Myrene Buddy Treating Enrique Manganaro/Extender: [name], [name] Weeks in Treatment: 0 Allergies Active Allergies No Known Drug Allergies Allergy Notes Electronic Signature(s) Signed: 01/23/2020 4:23:54 PM By: Montey Hora Entered By: Montey Hora on 01/23/2020 10:09:27 Rodenbeck, Dennie Maizes ([id number]) -------------------------------------------------------------------------------- Arrival Information Details Patient Name: Derald Macleod Date of Service: 01/23/2020 9:45 AM Medical Record Number: [id number] Patient Account Number: [id number] Date of Birth/Sex: 1946-09-20 (73 y.o. M) Treating RN: Montey Hora Primary Care Autrey Human: Derinda Late Other Clinician: Referring Carlesha Seiple: Myrene Buddy Treating Katilyn Miltenberger/Extender: Melburn Hake, [name] Weeks in Treatment: 0 Visit Information Patient Arrived: Cane Arrival Time: 10:03 Accompanied By: self Transfer Assistance: None Patient Identification Verified: Yes Secondary Verification Process Yes Completed: Patient Has Alerts: Yes Patient Alerts: Patient on Blood Thinner DMII Eliquis Electronic Signature(s) Signed: 01/23/2020 4:23:54 PM By: Montey Hora Entered By: Montey Hora on 01/23/2020 10:03:40 Chriscoe, Dennie Maizes ([id number]) -------------------------------------------------------------------------------- Clinic Level of Care Assessment Details Patient Name: Derald Macleod Date of Service: 01/23/2020 9:45 AM Medical Record Number: [id number] Patient Account Number: [id number] Date of Birth/Sex: 21-Apr-1946 (73 y.o. M) Treating RN: Cornell Barman Primary Care  Ranjit Ashurst: Derinda Late Other Clinician: Referring Becket Wecker: Myrene Buddy Treating Tannie Koskela/Extender: [name], [name] Weeks in Treatment: 0 Clinic Level of Care Assessment Items TOOL 2 Quantity Score []  - Use when only an EandM is performed on the INITIAL visit 0 ASSESSMENTS - Nursing Assessment / Reassessment X - General Physical Exam (combine w/ comprehensive assessment (listed just below) when 1 20 performed on new pt. evals) X- 1 25 Comprehensive Assessment (HX, ROS, Risk Assessments, Wounds Hx, etc.) ASSESSMENTS - Wound and Skin Assessment / Reassessment X - Simple Wound Assessment / Reassessment - one wound 1 5 []  - 0 Complex Wound Assessment / Reassessment - multiple wounds []  - 0 Dermatologic / Skin Assessment (not related to wound area) ASSESSMENTS - Ostomy and/or Continence Assessment and Care []  - Incontinence Assessment and Management 0 []  - 0 Ostomy Care Assessment and Management (repouching, etc.) PROCESS - Coordination of Care X - Simple Patient / Family Education for ongoing care 1 15 []  - 0 Complex (extensive) Patient / Family Education for ongoing care []  - 0 Staff obtains Programmer, systems, Records, Test Results / Process Orders []  - 0 Staff telephones HHA, Nursing Homes / Clarify orders / etc []  - 0 Routine Transfer to another Facility (non-emergent condition) []  - 0 Routine Hospital Admission (non-emergent condition) X- 1 15 New Admissions / Biomedical engineer / Ordering NPWT, Apligraf, etc. []  - 0 Emergency Hospital Admission (emergent condition) X- 1 10 Simple Discharge Coordination []  - 0 Complex (extensive) Discharge Coordination PROCESS - Special Needs []  - Pediatric / Minor Patient Management 0 []  - 0 Isolation Patient Management Hayworth, Sherrod S. ([id number]) []  - 0 Hearing / Language / Visual special needs []  - 0 Assessment of Community assistance (transportation, D/C planning, etc.) []  - 0 Additional assistance / Altered  mentation []  - 0 Support Surface(s) Assessment (bed, cushion, seat, etc.) INTERVENTIONS - Wound Cleansing / Measurement X - Wound Imaging (photographs - any number of wounds) 1 5 []  - 0 Wound Tracing (instead of photographs) X- 1 5 Simple Wound Measurement - one wound []  - 0 Complex Wound Measurement - multiple wounds X-  1 5 Simple Wound Cleansing - one wound []  - 0 Complex Wound Cleansing - multiple wounds INTERVENTIONS - Wound Dressings X - Small Wound Dressing one or multiple wounds 1 10 []  - 0 Medium Wound Dressing one or multiple wounds []  - 0 Large Wound Dressing one or multiple wounds []  - 0 Application of Medications - injection INTERVENTIONS - Miscellaneous []  - External ear exam 0 []  - 0 Specimen Collection (cultures, biopsies, blood, body fluids, etc.) []  - 0 Specimen(s) / Culture(s) sent or taken to Lab for analysis []  - 0 Patient Transfer (multiple staff / Civil Service fast streamer / Similar devices) []  - 0 Simple Staple / Suture removal (25 or less) []  - 0 Complex Staple / Suture removal (26 or more) []  - 0 Hypo / Hyperglycemic Management (close monitor of Blood Glucose) X- 1 15 Ankle / Brachial Index (ABI) - do not check if billed separately Has the patient been seen at the hospital within the last three years: Yes Total Score: 130 Level Of Care: New/Established - Level 4 Electronic Signature(s) Signed: 01/24/2020 4:33:39 PM By: Gretta Cool, BSN, RN, CWS, Kim RN, BSN Entered By: Gretta Cool, BSN, RN, CWS, Kim on 01/23/2020 10:51:25 Lex, Dennie Maizes ([id number]) -------------------------------------------------------------------------------- Encounter Discharge Information Details Patient Name: Derald Macleod Date of Service: 01/23/2020 9:45 AM Medical Record Number: [id number] Patient Account Number: [id number] Date of Birth/Sex: 12-08-1946 (73 y.o. M) Treating RN: Cornell Barman Primary Care Calvina Liptak: Derinda Late Other Clinician: Referring Jim Philemon: Myrene Buddy Treating Zavior Thomason/Extender: Melburn Hake, [name] Weeks in Treatment: 0 Encounter Discharge Information Items Discharge Condition: Stable Ambulatory Status: Ambulatory Discharge Destination: Home Transportation: Private Auto Accompanied By: self Schedule Follow-up Appointment: Yes Clinical Summary of Care: Electronic Signature(s) Signed: 01/24/2020 4:33:39 PM By: Gretta Cool, BSN, RN, CWS, Kim RN, BSN Entered By: Gretta Cool, BSN, RN, CWS, Kim on 01/23/2020 10:52:26 Boulay, Dennie Maizes ([id number]) -------------------------------------------------------------------------------- Lower Extremity Assessment Details Patient Name: Derald Macleod Date of Service: 01/23/2020 9:45 AM Medical Record Number: [id number] Patient Account Number: [id number] Date of Birth/Sex: 05-21-1946 (73 y.o. M) Treating RN: Montey Hora Primary Care Glenetta Kiger: BABAOFF, MARCUS Other Clinician: Referring Jamilet Ambroise: Myrene Buddy Treating Tanara Turvey/Extender: [name], [name] Weeks in Treatment: 0 Edema Assessment Assessed: [Left: No] [Right: No] Edema: [Left: Yes] [Right: Yes] Calf Left: Right: Point of Measurement: 34 cm From Medial Instep 42 cm 38.3 cm Ankle Left: Right: Point of Measurement: 12 cm From Medial Instep 28.5 cm 29 cm Vascular Assessment Pulses: Dorsalis Pedis Palpable: [Left:Yes] [Right:Yes] Doppler Audible: [Left:Yes] [Right:Yes] Posterior Tibial Palpable: [Left:Yes] [Right:Yes] Doppler Audible: [Left:Yes] [Right:Yes] Blood Pressure: Brachial: [Left:122] [Right:116] Dorsalis Pedis: 128 [Left:Dorsalis Pedis: 110] Ankle: Posterior Tibial: 90 [Left:Posterior Tibial: 88 1.05] [Right:0.90] Electronic Signature(s) Signed: 01/23/2020 4:23:54 PM By: Montey Hora Entered By: Montey Hora on 01/23/2020 10:34:50 Supple, Dennie Maizes ([id number]) -------------------------------------------------------------------------------- Multi Wound Chart Details Patient Name: Derald Macleod Date  of Service: 01/23/2020 9:45 AM Medical Record Number: [id number] Patient Account Number: [id number] Date of Birth/Sex: Jan 13, 1946 (74 y.o. M) Treating RN: Cornell Barman Primary Care Revia Nghiem: Derinda Late Other Clinician: Referring Demitris Pokorny: Myrene Buddy Treating Aleda Madl/Extender: [name], [name] Weeks in Treatment: 0 Vital Signs Height(in): 72 Pulse(bpm): 92 Weight(lbs): 280 Blood Pressure(mmHg): 127/57 Body Mass Index(BMI): 38 Temperature(F): 98.6 Respiratory Rate 18 (breaths/min): Photos: [N/A:N/A] Wound Location: Left Lower Leg - Anterior N/A N/A Wounding Event: Blister N/A N/A Primary Etiology: Lymphedema N/A N/A Comorbid History: Lymphedema, Chronic N/A N/A Obstructive Pulmonary Disease (COPD), Deep Vein Thrombosis, Hypertension, Peripheral Venous Disease, Type II Diabetes, End Stage Renal Disease Date Acquired:  12/11/2019 N/A N/A Weeks of Treatment: 0 N/A N/A Wound Status: Open N/A N/A Measurements L x W x D 0.2x0.3x0.1 N/A N/A (cm) Area (cm) : 0.047 N/A N/A Volume (cm) : 0.005 N/A N/A Classification: Full Thickness Without N/A N/A Exposed Support Structures Exudate Amount: Medium N/A N/A Exudate Type: Serous N/A N/A Exudate Color: amber N/A N/A Wound Margin: Flat and Intact N/A N/A Granulation Amount: Large (67-100%) N/A N/A Granulation Quality: Pink N/A N/A Necrotic Amount: Small (1-33%) N/A N/A Exposed Structures: Fat Layer (Subcutaneous N/A N/A Tissue) Exposed: Yes Fascia: No Puchalski, Harvard S. ([id number]) Tendon: No Muscle: No Joint: No Bone: No Epithelialization: Small (1-33%) N/A N/A Treatment Notes Electronic Signature(s) Signed: 01/24/2020 4:33:39 PM By: Gretta Cool, BSN, RN, CWS, Kim RN, BSN Entered By: Gretta Cool, BSN, RN, CWS, Kim on 01/23/2020 10:49:23 Rymer, Dennie Maizes ([id number]) -------------------------------------------------------------------------------- Multi-Disciplinary Care Plan Details Patient Name: Derald Macleod Date of  Service: 01/23/2020 9:45 AM Medical Record Number: [id number] Patient Account Number: [id number] Date of Birth/Sex: 07/16/1946 (73 y.o. M) Treating RN: Cornell Barman Primary Care Yancy Knoble: Derinda Late Other Clinician: Referring Dustie Brittle: Myrene Buddy Treating Kerly Rigsbee/Extender: [name], [name] Weeks in Treatment: 0 Active Inactive Orientation to the Wound Care Program Nursing Diagnoses: Knowledge deficit related to the wound healing center program Goals: Patient/caregiver will verbalize understanding of the [facility] Date Initiated: 01/23/2020 Target Resolution Date: 01/30/2020 Goal Status: Active Interventions: Provide education on orientation to the wound center Notes: Venous Leg Ulcer Nursing Diagnoses: Actual venous Insuffiency (use after diagnosis is confirmed) Goals: Patient will maintain optimal edema control Date Initiated: 01/23/2020 Target Resolution Date: 01/30/2020 Goal Status: Active Interventions: Assess peripheral edema status every visit. Treatment Activities: Therapeutic compression applied : 01/23/2020 Notes: Wound/Skin Impairment Nursing Diagnoses: Impaired tissue integrity Goals: Patient/caregiver will verbalize understanding of skin care regimen Date Initiated: 01/23/2020 Target Resolution Date: 01/30/2020 Goal Status: Active ERNEST, POPOWSKI ([id number]) Interventions: Provide education on ulcer and skin care Notes: Electronic Signature(s) Signed: 01/24/2020 4:33:39 PM By: Gretta Cool, BSN, RN, CWS, Kim RN, BSN Entered By: Gretta Cool, BSN, RN, CWS, Kim on 01/23/2020 10:49:06 Shanks, Dennie Maizes ([id number]) -------------------------------------------------------------------------------- Pain Assessment Details Patient Name: Derald Macleod Date of Service: 01/23/2020 9:45 AM Medical Record Number: [id number] Patient Account Number: [id number] Date of Birth/Sex: Feb 05, 1946 (73 y.o. M) Treating RN: Montey Hora Primary Care Dayron Odland:  Derinda Late Other Clinician: Referring Keishaun Hazel: Myrene Buddy Treating Mardell Cragg/Extender: [name], [name] Weeks in Treatment: 0 Active Problems Location of Pain Severity and Description of Pain Patient Has Paino No Site Locations Pain Management and Medication Current Pain Management: Electronic Signature(s) Signed: 01/23/2020 4:23:54 PM By: Montey Hora Entered By: Montey Hora on 01/23/2020 10:03:55 Kilmartin, Dennie Maizes ([id number]) -------------------------------------------------------------------------------- Patient/Caregiver Education Details Patient Name: Derald Macleod Date of Service: 01/23/2020 9:45 AM Medical Record Number: [id number] Patient Account Number: [id number] Date of Birth/Gender: [birth year] (73 y.o. M) Treating RN: Cornell Barman Primary Care Physician: Derinda Late Other Clinician: Referring Physician: Myrene Buddy Treating Physician/Extender: Melburn Hake, [name] Weeks in Treatment: 0 Education Assessment Education Provided To: Patient Education Topics Provided Venous: Handouts: Controlling Swelling with Compression Stockings Methods: Demonstration, Explain/Verbal Responses: State content correctly Wound/Skin Impairment: Handouts: Caring for Your Ulcer Methods: Demonstration, Explain/Verbal Responses: State content correctly Electronic Signature(s) Signed: 01/24/2020 4:33:39 PM By: Gretta Cool, BSN, RN, CWS, Kim RN, BSN Entered By: Gretta Cool, BSN, RN, CWS, Kim on 01/23/2020 10:51:53 Bezek, Dennie Maizes ([id number]) -------------------------------------------------------------------------------- Wound Assessment Details Patient Name: Derald Macleod Date of Service: 01/23/2020 9:45 AM Medical Record Number: [id number] Patient Account Number:  [id number] Date of Birth/Sex: 09/26/1946 (74 y.o. M) Treating RN: Montey Hora Primary Care Kameah Rawl: BABAOFF, MARCUS Other Clinician: Referring Estefania Kamiya: Myrene Buddy Treating Xee Hollman/Extender:  [name], [name] Weeks in Treatment: 0 Wound Status Wound Number: 1 Primary Lymphedema Etiology: Wound Location: Left Lower Leg - Anterior Wound Open Wounding Event: Blister Status: Date Acquired: 12/11/2019 Comorbid Lymphedema, Chronic Obstructive Pulmonary Weeks Of Treatment: 0 History: Disease (COPD), Deep Vein Thrombosis, Clustered Wound: No Hypertension, Peripheral Venous Disease, Type II Diabetes, End Stage Renal Disease Photos Wound Measurements Length: (cm) 0.2 Width: (cm) 0.3 Depth: (cm) 0.1 Area: (cm) 0.047 Volume: (cm) 0.005 % Reduction in Area: % Reduction in Volume: Epithelialization: Small (1-33%) Tunneling: No Undermining: No Wound Description Full Thickness Without Exposed Support Foul Odo Classification: Structures Slough/F Wound Margin: Flat and Intact Exudate Medium Amount: Exudate Type: Serous Exudate Color: amber r After Cleansing: No ibrino Yes Wound Bed Granulation Amount: Large (67-100%) Exposed Structure Granulation Quality: Pink Fascia Exposed: No Necrotic Amount: Small (1-33%) Fat Layer (Subcutaneous Tissue) Exposed: Yes Necrotic Quality: Adherent Slough Tendon Exposed: No Muscle Exposed: No Joint Exposed: No Bone Exposed: No Monjaras, Calub S. ([id number]) Treatment Notes Wound #1 (Left, Anterior Lower Leg) 7. Secured with Patient to wear own compression stockings Electronic Signature(s) Signed: 01/23/2020 4:23:54 PM By: Montey Hora Entered By: Montey Hora on 01/23/2020 10:25:31 Koegel, Dennie Maizes ([id number]) -------------------------------------------------------------------------------- [place] Details Patient Name: Derald Macleod Date of Service: 01/23/2020 9:45 AM Medical Record Number: [id number] Patient Account Number: [id number] Date of Birth/Sex: [birth year] (73 y.o. M) Treating RN: Montey Hora Primary Care Ghada Abbett: BABAOFF, MARCUS Other Clinician: Referring Avanell Banwart: Myrene Buddy Treating  Charlane Westry/Extender: [name], [name] Weeks in Treatment: 0 Vital Signs Time Taken: 10:04 Temperature (F): 98.6 Height (in): 72 Pulse (bpm): 92 Source: Measured Respiratory Rate (breaths/min): 18 Weight (lbs): 280 Blood Pressure (mmHg): 127/57 Source: Measured Reference Range: 80 - 120 mg / dl Body Mass Index (BMI): 38 Electronic Signature(s) Signed: 01/23/2020 4:23:54 PM By: Montey Hora Entered By: Montey Hora on 01/23/2020 10:09:16

## 2020-01-30 ENCOUNTER — Other Ambulatory Visit: Payer: Self-pay

## 2020-01-30 ENCOUNTER — Encounter: Payer: Medicare Other | Admitting: Physician Assistant

## 2020-01-30 DIAGNOSIS — E11622 Type 2 diabetes mellitus with other skin ulcer: Secondary | ICD-10-CM | POA: Diagnosis not present

## 2020-01-30 NOTE — Progress Notes (Signed)
[name], [name] ([id number]) Visit Report for 01/30/2020 Arrival Information Details Patient Name: [name], [name] Date of Service: 01/30/2020 12:45 PM Medical Record Number: [id number] Patient Account Number: [id number] Date of Birth/Sex: December 05, 1946 (74 y.o. M) Treating RN: Cornell Barman Primary Care Sandrine Bloodsworth: Derinda Late Other Clinician: Referring Emin Foree: BABAOFF, MARCUS Treating Thuan Tippett/Extender: Melburn Hake, [name] Weeks in Treatment: 1 Visit Information History Since Last Visit Added or deleted any medications: No Patient Arrived: Cane Any new allergies or adverse reactions: No Arrival Time: 13:26 Had a fall or experienced change in No Accompanied By: self activities of daily living that may affect Transfer Assistance: None risk of falls: Patient Identification Verified: Yes Signs or symptoms of abuse/neglect since last visito No Secondary Verification Process Yes Hospitalized since last visit: No Completed: Implantable device outside of the clinic excluding No Patient Has Alerts: Yes cellular tissue based products placed in the center Patient Alerts: Patient on Blood since last visit: Thinner Has Compression in Place as Prescribed: Yes DMII Pain Present Now: No Eliquis Electronic Signature(s) Signed: 01/30/2020 4:18:59 PM By: Lorine Bears RCP, RRT, CHT Entered By: Lorine Bears on 01/30/2020 13:27:22 [place], [name] ([id number]) -------------------------------------------------------------------------------- Clinic Level of Care Assessment Details Patient Name: [name] Date of Service: 01/30/2020 12:45 PM Medical Record Number: [id number] Patient Account Number: [id number] Date of Birth/Sex: [birth year] (73 y.o. M) Treating RN: Army Melia Primary Care Deyra Perdomo: BABAOFF, MARCUS Other Clinician: Referring Hannalee Castor: BABAOFF, MARCUS Treating Journei Thomassen/Extender: [name], [name] Weeks in Treatment: 1 Clinic Level of Care  Assessment Items TOOL 4 Quantity Score []  - Use when only an EandM is performed on FOLLOW-UP visit 0 ASSESSMENTS - Nursing Assessment / Reassessment X - Reassessment of Co-morbidities (includes updates in patient status) 1 10 X- 1 5 Reassessment of Adherence to Treatment Plan ASSESSMENTS - Wound and Skin Assessment / Reassessment X - Simple Wound Assessment / Reassessment - one wound 1 5 []  - 0 Complex Wound Assessment / Reassessment - multiple wounds []  - 0 Dermatologic / Skin Assessment (not related to wound area) ASSESSMENTS - Focused Assessment []  - Circumferential Edema Measurements - multi extremities 0 []  - 0 Nutritional Assessment / Counseling / Intervention X- 1 5 Lower Extremity Assessment (monofilament, tuning fork, pulses) []  - 0 Peripheral Arterial Disease Assessment (using hand held doppler) ASSESSMENTS - Ostomy and/or Continence Assessment and Care []  - Incontinence Assessment and Management 0 []  - 0 Ostomy Care Assessment and Management (repouching, etc.) PROCESS - Coordination of Care X - Simple Patient / Family Education for ongoing care 1 15 []  - 0 Complex (extensive) Patient / Family Education for ongoing care X- 1 10 Staff obtains Programmer, systems, Records, Test Results / Process Orders []  - 0 Staff telephones HHA, Nursing Homes / Clarify orders / etc []  - 0 Routine Transfer to another Facility (non-emergent condition) []  - 0 Routine Hospital Admission (non-emergent condition) []  - 0 New Admissions / Biomedical engineer / Ordering NPWT, Apligraf, etc. []  - 0 Emergency Hospital Admission (emergent condition) X- 1 10 Simple Discharge Coordination Cottrell, Shia S. ([id number]) []  - 0 Complex (extensive) Discharge Coordination PROCESS - Special Needs []  - Pediatric / Minor Patient Management 0 []  - 0 Isolation Patient Management []  - 0 Hearing / Language / Visual special needs []  - 0 Assessment of Community assistance (transportation, D/C planning,  etc.) []  - 0 Additional assistance / Altered mentation []  - 0 Support Surface(s) Assessment (bed, cushion, seat, etc.) INTERVENTIONS - Wound Cleansing / Measurement X - Simple Wound Cleansing - one  wound 1 5 []  - 0 Complex Wound Cleansing - multiple wounds X- 1 5 Wound Imaging (photographs - any number of wounds) []  - 0 Wound Tracing (instead of photographs) X- 1 5 Simple Wound Measurement - one wound []  - 0 Complex Wound Measurement - multiple wounds INTERVENTIONS - Wound Dressings []  - Small Wound Dressing one or multiple wounds 0 []  - 0 Medium Wound Dressing one or multiple wounds []  - 0 Large Wound Dressing one or multiple wounds []  - 0 Application of Medications - topical []  - 0 Application of Medications - injection INTERVENTIONS - Miscellaneous []  - External ear exam 0 []  - 0 Specimen Collection (cultures, biopsies, blood, body fluids, etc.) []  - 0 Specimen(s) / Culture(s) sent or taken to Lab for analysis []  - 0 Patient Transfer (multiple staff / Civil Service fast streamer / Similar devices) []  - 0 Simple Staple / Suture removal (25 or less) []  - 0 Complex Staple / Suture removal (26 or more) []  - 0 Hypo / Hyperglycemic Management (close monitor of Blood Glucose) []  - 0 Ankle / Brachial Index (ABI) - do not check if billed separately X- 1 5 Vital Signs [name], Curtis S. ([id number]) Has the patient been seen at the hospital within the last three years: Yes Total Score: 80 Level Of Care: New/Established - Level 3 Electronic Signature(s) Signed: 01/30/2020 4:18:43 PM By: Army Melia Entered By: Army Melia on 01/30/2020 13:57:08 [name], [name] ([id number]) -------------------------------------------------------------------------------- Encounter Discharge Information Details Patient Name: [name] Date of Service: 01/30/2020 12:45 PM Medical Record Number: [id number] Patient Account Number: [id number] Date of Birth/Sex: [birth year] (73 y.o. M) Treating RN:  Army Melia Primary Care Kiyah Demartini: Derinda Late Other Clinician: Referring Katheryne Gorr: BABAOFF, MARCUS Treating Tylea Hise/Extender: Melburn Hake, [name] Weeks in Treatment: 1 Encounter Discharge Information Items Discharge Condition: Stable Ambulatory Status: Ambulatory Discharge Destination: Home Transportation: Private Auto Accompanied By: self Schedule Follow-up Appointment: Yes Clinical Summary of Care: Electronic Signature(s) Signed: 01/30/2020 4:18:43 PM By: Army Melia Entered By: Army Melia on 01/30/2020 13:57:36 [name], [name] ([id number]) -------------------------------------------------------------------------------- Lower Extremity Assessment Details Patient Name: [name] Date of Service: 01/30/2020 12:45 PM Medical Record Number: [id number] Patient Account Number: [id number] Date of Birth/Sex: 06-14-1946 (73 y.o. M) Treating RN: Cornell Barman Primary Care Ronell Duffus: BABAOFF, MARCUS Other Clinician: Referring Antonea Gaut: BABAOFF, MARCUS Treating Austin Pongratz/Extender: [name], [name] Weeks in Treatment: 1 Edema Assessment Assessed: [Left: Yes] [Right: No] Edema: [Left: Ye] [Right: s] Calf Left: Right: Point of Measurement: 34 cm From Medial Instep 45 cm cm Ankle Left: Right: Point of Measurement: 12 cm From Medial Instep 31.5 cm cm Vascular Assessment Pulses: Dorsalis Pedis Palpable: [Left:Yes] Notes Leg reddened. Electronic Signature(s) Signed: 01/30/2020 5:08:08 PM By: Gretta Cool, BSN, RN, CWS, Kim RN, BSN Entered By: Gretta Cool, BSN, RN, CWS, Kim on 01/30/2020 13:41:04 [name], [name] ([id number]) -------------------------------------------------------------------------------- Multi Wound Chart Details Patient Name: [name] Date of Service: 01/30/2020 12:45 PM Medical Record Number: [id number] Patient Account Number: [id number] Date of Birth/Sex: [birth year] (73 y.o. M) Treating RN: Army Melia Primary Care Tehran Rabenold: BABAOFF, MARCUS Other  Clinician: Referring Gerry Heaphy: BABAOFF, MARCUS Treating Kunaal Walkins/Extender: [name], [name] Weeks in Treatment: 1 Vital Signs Height(in): 72 Pulse(bpm): 87 Weight(lbs): 280 Blood Pressure(mmHg): 137/62 Body Mass Index(BMI): 38 Temperature(F): 98.0 Respiratory Rate 18 (breaths/min): Photos: [1:No Photos] [N/A:N/A] Wound Location: [1:Left, Anterior Lower Leg] [N/A:N/A] Wounding Event: [1:Blister] [N/A:N/A] Primary Etiology: [1:Lymphedema] [N/A:N/A] Date Acquired: [1:12/11/2019] [N/A:N/A] Weeks of Treatment: [1:1] [N/A:N/A] Wound Status: [1:Healed - Epithelialized] [N/A:N/A] Measurements L x W x D [  1:0x0x0] [N/A:N/A] (cm) Area (cm) : [1:0] [N/A:N/A] Volume (cm) : [1:0] [N/A:N/A] % Reduction in Area: [1:100.00%] [N/A:N/A] % Reduction in Volume: [1:100.00%] [N/A:N/A] Classification: [1:Full Thickness Without Exposed Support Structures] [N/A:N/A] Treatment Notes Electronic Signature(s) Signed: 01/30/2020 4:18:43 PM By: Army Melia Entered By: Army Melia on 01/30/2020 13:56:26 [name], [name] ([id number]) -------------------------------------------------------------------------------- [place] Details Patient Name: [name] Date of Service: 01/30/2020 12:45 PM Medical Record Number: [id number] Patient Account Number: [id number] Date of Birth/Sex: July 01, 1946 (73 y.o. M) Treating RN: Army Melia Primary Care Lukah Goswami: Derinda Late Other Clinician: Referring Fredrico Beedle: BABAOFF, MARCUS Treating Xena Propst/Extender: Melburn Hake, [name] Weeks in Treatment: 1 Active Inactive Electronic Signature(s) Signed: 01/30/2020 4:18:43 PM By: Army Melia Entered By: Army Melia on 01/30/2020 13:56:03 [name], [name] ([id number]) -------------------------------------------------------------------------------- Pain Assessment Details Patient Name: [name] Date of Service: 01/30/2020 12:45 PM Medical Record Number: [id number] Patient Account  Number: [id number] Date of Birth/Sex: [birth year] (73 y.o. M) Treating RN: Cornell Barman Primary Care Yosiel Thieme: Derinda Late Other Clinician: Referring Aniyah Nobis: BABAOFF, MARCUS Treating Master Touchet/Extender: Melburn Hake, [name] Weeks in Treatment: 1 Active Problems Location of Pain Severity and Description of Pain Patient Has Paino No Site Locations Pain Management and Medication Current Pain Management: Electronic Signature(s) Signed: 01/30/2020 4:18:59 PM By: Lorine Bears RCP, RRT, CHT Signed: 01/30/2020 5:08:08 PM By: Gretta Cool, BSN, RN, CWS, Kim RN, BSN Entered By: Lorine Bears on 01/30/2020 13:27:29 [name], [name] ([id number]) -------------------------------------------------------------------------------- Patient/Caregiver Education Details Patient Name: [name] Date of Service: 01/30/2020 12:45 PM Medical Record Number: [id number] Patient Account Number: [id number] Date of Birth/Gender: 1946-02-25 (73 y.o. M) Treating RN: Army Melia Primary Care Physician: Derinda Late Other Clinician: Referring Physician: BABAOFF, MARCUS Treating Physician/Extender: Melburn Hake, [name] Weeks in Treatment: 1 Education Assessment Education Provided To: Patient Education Topics Provided Wound/Skin Impairment: Handouts: Caring for Your Ulcer Methods: Demonstration, Explain/Verbal Responses: State content correctly Electronic Signature(s) Signed: 01/30/2020 4:18:43 PM By: Army Melia Entered By: Army Melia on 01/30/2020 13:57:21 [name], [name] ([id number]) -------------------------------------------------------------------------------- Wound Assessment Details Patient Name: [name] Date of Service: 01/30/2020 12:45 PM Medical Record Number: [id number] Patient Account Number: [id number] Date of Birth/Sex: Oct 10, 1946 (73 y.o. M) Treating RN: Cornell Barman Primary Care Leelynn Whetsel: BABAOFF, MARCUS Other Clinician: Referring Shakeeta Godette: BABAOFF,  MARCUS Treating Iolanda Folson/Extender: [name], [name] Weeks in Treatment: 1 Wound Status Wound Number: 1 Primary Etiology: Lymphedema Wound Location: Left, Anterior Lower Leg Wound Status: Healed - Epithelialized Wounding Event: Blister Date Acquired: 12/11/2019 Weeks Of Treatment: 1 Clustered Wound: No Wound Measurements Length: (cm) 0 % Width: (cm) 0 % Depth: (cm) 0 Area: (cm) 0 Volume: (cm) 0 Reduction in Area: 100% Reduction in Volume: 100% Wound Description Full Thickness Without Exposed Support Classification: Structures Electronic Signature(s) Signed: 01/30/2020 5:08:08 PM By: Gretta Cool, BSN, RN, CWS, Kim RN, BSN Entered By: Gretta Cool, BSN, RN, CWS, Kim on 01/30/2020 13:35:41 [name], [name] ([id number]) -------------------------------------------------------------------------------- [place] Details Patient Name: [name] Date of Service: 01/30/2020 12:45 PM Medical Record Number: [id number] Patient Account Number: [id number] Date of Birth/Sex: [birth year] (73 y.o. M) Treating RN: Cornell Barman Primary Care Letizia Hook: BABAOFF, MARCUS Other Clinician: Referring Dawaun Brancato: BABAOFF, MARCUS Treating Baine Decesare/Extender: [name], [name] Weeks in Treatment: 1 Vital Signs Time Taken: 13:30 Temperature (F): 98.0 Height (in): 72 Pulse (bpm): 87 Weight (lbs): 280 Respiratory Rate (breaths/min): 18 Body Mass Index (BMI): 38 Blood Pressure (mmHg): 137/62 Reference Range: 80 - 120 mg / dl Electronic Signature(s) Signed: 01/30/2020 4:18:59 PM By: Becky Sax, Sallie RCP, RRT, CHT Entered  By: Lorine Bears on 01/30/2020 13:30:26

## 2020-01-30 NOTE — Progress Notes (Addendum)
ARYAMAN, HALIBURTON ([id number]) Visit Report for 01/30/2020 Chief Complaint Document Details Patient Name: [name], [name] Date of Service: 01/30/2020 12:45 PM Medical Record Number: [id number] Patient Account Number: [id number] Date of Birth/Sex: 10-19-1946 (74 y.o. M) Treating RN: Cornell Barman Primary Care Provider: Derinda Late Other Clinician: Referring Provider: Derinda Late Treating Provider/Extender: Melburn Hake, Halla Chopp Weeks in Treatment: 1 Information Obtained from: Patient Chief Complaint Left LE Ulcer Electronic Signature(s) Signed: 01/30/2020 1:48:32 PM By: Worthy Keeler PA-C Entered By: Worthy Keeler on 01/30/2020 13:48:32 Curto, Dennie Maizes ([id number]) -------------------------------------------------------------------------------- HPI Details Patient Name: [name] Date of Service: 01/30/2020 12:45 PM Medical Record Number: [id number] Patient Account Number: [id number] Date of Birth/Sex: 12/06/1946 (74 y.o. M) Treating RN: Cornell Barman Primary Care Provider: Derinda Late Other Clinician: Referring Provider: BABAOFF, MARCUS Treating Provider/Extender: Melburn Hake, Merion Grimaldo Weeks in Treatment: 1 History of Present Illness HPI Description: 01/23/2020 upon evaluation today patient appears to be doing decently well in regard to his bilateral lower extremities. He does unfortunately have a history significant for lower extremity lymphedema, hypertension, and diabetes mellitus type 2. His most recent hemoglobin A1c was 7.2 that was on 01/10/2020. With that being said he tells me that his legs were actually tremendously larger than what they are now prior to having been admitted to the hospital where he was placed on antibiotics and IV Lasix to diurese. It was his vascular physician, Dr. Lucky Cowboy, who referred the patient to his nephrologist to get this started. Nonetheless they admitted him to the hospital and he tells me that post the hospital stay he has been doing much  better he was discharged about a week and a half ago and they had a wrap on him they told him to keep in place until he came here to see me today. Currently there does appear to be some drainage at the site where he has the wound although the wound itself may have completely closed up were not entirely sure here to be perfectly honest that there could be still something leaking especially in light of the lymphedema. Either way I think that he is in a likely benefit from [place] monitoring this for at least a week prior to discharging him is healed I do not want anything to reopen and cause problems like what he was having previous. He does have compression socks that he got from [employer] socks here in [facility] and fortunately they seem to be of good quality there 20-30 mmHg which is perfect. 01/30/2020 upon evaluation today patient appears to be doing well with regard to his lower extremity though is a little bit more swollen today he still does not have anything that appears to be open which is good news. No fever chills noted. With that being said I do think that he is likely ready to be discharged from the wound care center at this point wearing his compression stockings. Electronic Signature(s) Signed: 01/30/2020 4:31:54 PM By: Worthy Keeler PA-C Entered By: Worthy Keeler on 01/30/2020 16:31:54 Casasola, Dennie Maizes ([id number]) -------------------------------------------------------------------------------- Physical Exam Details Patient Name: [name] Date of Service: 01/30/2020 12:45 PM Medical Record Number: [id number] Patient Account Number: [id number] Date of Birth/Sex: 13-Dec-1946 (74 y.o. M) Treating RN: Cornell Barman Primary Care Provider: Derinda Late Other Clinician: Referring Provider: BABAOFF, MARCUS Treating Provider/Extender: [name], Maud Rubendall Weeks in Treatment: 1 Constitutional Well-nourished and well-hydrated in no acute distress. Respiratory normal  breathing without difficulty. Psychiatric this patient is able to make decisions and  demonstrates good insight into disease process. Alert and Oriented x 3. pleasant and cooperative. Notes Upon inspection patient's wounds again all appear to be sealed up I do not see anything that is draining at this point. He has been wearing his compression stocking which were the 20-30 mmHg which is appropriate at this time. Overall I am pleased in that regard. In general I think that he is likely going to continue with this and likely as long as he does should be able to keep things under good control Electronic Signature(s) Signed: 01/30/2020 4:32:27 PM By: Worthy Keeler PA-C Entered By: Worthy Keeler on 01/30/2020 16:32:27 Wilner, Dennie Maizes ([id number]) -------------------------------------------------------------------------------- Physician Orders Details Patient Name: [name] Date of Service: 01/30/2020 12:45 PM Medical Record Number: [id number] Patient Account Number: [id number] Date of Birth/Sex: [birth year] (74 y.o. M) Treating RN: Army Melia Primary Care Provider: BABAOFF, MARCUS Other Clinician: Referring Provider: BABAOFF, MARCUS Treating Provider/Extender: [name], Efosa Treichler Weeks in Treatment: 1 Verbal / Phone Orders: No Diagnosis Coding ICD-10 Coding Code Description I89.0 Lymphedema, not elsewhere classified L97.822 Non-pressure chronic ulcer of other part of left lower leg with fat layer exposed I10 Essential (primary) hypertension E11.622 Type 2 diabetes mellitus with other skin ulcer Discharge From [facility] Services o Discharge from [place] up as needed Electronic Signature(s) Signed: 01/30/2020 4:18:43 PM By: Army Melia Signed: 01/30/2020 4:43:11 PM By: Worthy Keeler PA-C Entered By: Army Melia on 01/30/2020 13:56:45 Sagun, Dennie Maizes ([id number]) -------------------------------------------------------------------------------- Problem List  Details Patient Name: [name] Date of Service: 01/30/2020 12:45 PM Medical Record Number: [id number] Patient Account Number: [id number] Date of Birth/Sex: 1946-03-27 (74 y.o. M) Treating RN: Cornell Barman Primary Care Provider: Derinda Late Other Clinician: Referring Provider: BABAOFF, MARCUS Treating Provider/Extender: Melburn Hake, Saburo Luger Weeks in Treatment: 1 Active Problems ICD-10 Evaluated Encounter Code Description Active Date Today Diagnosis I89.0 Lymphedema, not elsewhere classified 01/23/2020 No Yes L97.822 Non-pressure chronic ulcer of other part of left lower leg with 01/23/2020 No Yes fat layer exposed I10 Essential (primary) hypertension 01/23/2020 No Yes E11.622 Type 2 diabetes mellitus with other skin ulcer 01/23/2020 No Yes Inactive Problems Resolved Problems Electronic Signature(s) Signed: 01/30/2020 1:48:27 PM By: Worthy Keeler PA-C Entered By: Worthy Keeler on 01/30/2020 13:48:26 Zhang, Dennie Maizes ([id number]) -------------------------------------------------------------------------------- Progress Note Details Patient Name: [name] Date of Service: 01/30/2020 12:45 PM Medical Record Number: [id number] Patient Account Number: [id number] Date of Birth/Sex: 26-Dec-1945 (74 y.o. M) Treating RN: Cornell Barman Primary Care Provider: Derinda Late Other Clinician: Referring Provider: BABAOFF, MARCUS Treating Provider/Extender: Melburn Hake, Ruben Mahler Weeks in Treatment: 1 Subjective Chief Complaint Information obtained from Patient Left LE Ulcer History of Present Illness (HPI) 01/23/2020 upon evaluation today patient appears to be doing decently well in regard to his bilateral lower extremities. He does unfortunately have a history significant for lower extremity lymphedema, hypertension, and diabetes mellitus type 2. His most recent hemoglobin A1c was 7.2 that was on 01/10/2020. With that being said he tells me that his legs were actually tremendously larger  than what they are now prior to having been admitted to the hospital where he was placed on antibiotics and IV Lasix to diurese. It was his vascular physician, Dr. Lucky Cowboy, who referred the patient to his nephrologist to get this started. Nonetheless they admitted him to the hospital and he tells me that post the hospital stay he has been doing much better he was discharged about a week and a half ago and they  had a wrap on him they told him to keep in place until he came here to see me today. Currently there does appear to be some drainage at the site where he has the wound although the wound itself may have completely closed up were not entirely sure here to be perfectly honest that there could be still something leaking especially in light of the lymphedema. Either way I think that he is in a likely benefit from [place] monitoring this for at least a week prior to discharging him is healed I do not want anything to reopen and cause problems like what he was having previous. He does have compression socks that he got from [place] socks here in [facility] and fortunately they seem to be of good quality there 20-30 mmHg which is perfect. 01/30/2020 upon evaluation today patient appears to be doing well with regard to his lower extremity though is a little bit more swollen today he still does not have anything that appears to be open which is good news. No fever chills noted. With that being said I do think that he is likely ready to be discharged from the wound care center at this point wearing his compression stockings. Objective Constitutional Well-nourished and well-hydrated in no acute distress. Vitals Time Taken: 1:30 PM, Height: 72 in, Weight: 280 lbs, BMI: 38, Temperature: 98.0 F, Pulse: 87 bpm, Respiratory Rate: 18 breaths/min, Blood Pressure: 137/62 mmHg. Respiratory normal breathing without difficulty. Psychiatric this patient is able to make decisions and demonstrates good  insight into disease process. Alert and Oriented x 3. pleasant Trela, Jakhari S. ([id number]) and cooperative. General Notes: Upon inspection patient's wounds again all appear to be sealed up I do not see anything that is draining at this point. He has been wearing his compression stocking which were the 20-30 mmHg which is appropriate at this time. Overall I am pleased in that regard. In general I think that he is likely going to continue with this and likely as long as he does should be able to keep things under good control Integumentary (Hair, Skin) Wound #1 status is Healed - Epithelialized. Original cause of wound was Blister. The wound is located on the Left,Anterior Lower Leg. The wound measures 0cm length x 0cm width x 0cm depth; 0cm^2 area and 0cm^3 volume. Assessment Active Problems ICD-10 Lymphedema, not elsewhere classified Non-pressure chronic ulcer of other part of left lower leg with fat layer exposed Essential (primary) hypertension Type 2 diabetes mellitus with other skin ulcer Plan Discharge From [facility] Services: Discharge from [place] up as needed 1. I would recommend that we discontinue wound care services at this point as the patient does appear to be healed he has his compression stockings and the plan going forward. 2. If he has any issues or anything reopens he knows to contact the office and let me know and we will see how things go following. We will see him back for a follow-up visit as needed. Electronic Signature(s) Signed: 01/30/2020 4:32:52 PM By: Worthy Keeler PA-C Entered By: Worthy Keeler on 01/30/2020 16:32:52 Mealey, Dennie Maizes ([id number]) -------------------------------------------------------------------------------- SuperBill Details Patient Name: [name] Date of Service: 01/30/2020 Medical Record Number: [id number] Patient Account Number: [id number] Date of Birth/Sex: September 08, 1946 (74 y.o. M) Treating RN: Cornell Barman Primary Care Provider: Derinda Late Other Clinician: Referring Provider: BABAOFF, MARCUS Treating Provider/Extender: [name], Keaja Reaume Weeks in Treatment: 1 Diagnosis Coding ICD-10 Codes Code Description I89.0 Lymphedema, not elsewhere  classified L97.822 Non-pressure chronic ulcer of other part of left lower leg with fat layer exposed I10 Essential (primary) hypertension E11.622 Type 2 diabetes mellitus with other skin ulcer Facility Procedures CPT4 Code: 41282081 Description: 99213 - WOUND CARE VISIT-LEV 3 EST PT Modifier: Quantity: 1 Physician Procedures CPT4 Code Description: [id number] 99213 - WC PHYS LEVEL 3 - EST PT ICD-10 Diagnosis Description I89.0 Lymphedema, not elsewhere classified L97.822 Non-pressure chronic ulcer of other part of left lower leg wit I10 Essential (primary) hypertension E11.622  Type 2 diabetes mellitus with other skin ulcer Modifier: h fat layer expos Quantity: 1 ed Electronic Signature(s) Signed: 01/30/2020 4:33:22 PM By: Worthy Keeler PA-C Entered By: Worthy Keeler on 01/30/2020 16:33:21

## 2020-02-16 ENCOUNTER — Other Ambulatory Visit: Payer: Self-pay

## 2020-02-16 ENCOUNTER — Inpatient Hospital Stay: Payer: Medicare Other | Attending: Oncology

## 2020-02-16 ENCOUNTER — Inpatient Hospital Stay: Payer: Medicare Other

## 2020-02-16 VITALS — BP 112/68 | HR 94

## 2020-02-16 DIAGNOSIS — I129 Hypertensive chronic kidney disease with stage 1 through stage 4 chronic kidney disease, or unspecified chronic kidney disease: Secondary | ICD-10-CM | POA: Diagnosis present

## 2020-02-16 DIAGNOSIS — D509 Iron deficiency anemia, unspecified: Secondary | ICD-10-CM

## 2020-02-16 DIAGNOSIS — D631 Anemia in chronic kidney disease: Secondary | ICD-10-CM | POA: Insufficient documentation

## 2020-02-16 DIAGNOSIS — N189 Chronic kidney disease, unspecified: Secondary | ICD-10-CM | POA: Insufficient documentation

## 2020-02-16 DIAGNOSIS — Z79899 Other long term (current) drug therapy: Secondary | ICD-10-CM | POA: Insufficient documentation

## 2020-02-16 LAB — CBC WITH DIFFERENTIAL/PLATELET
Abs Immature Granulocytes: 0.03 10*3/uL (ref 0.00–0.07)
Basophils Absolute: 0 10*3/uL (ref 0.0–0.1)
Basophils Relative: 1 %
Eosinophils Absolute: 0.1 10*3/uL (ref 0.0–0.5)
Eosinophils Relative: 2 %
HCT: 32.5 % — ABNORMAL LOW (ref 39.0–52.0)
Hemoglobin: 9.4 g/dL — ABNORMAL LOW (ref 13.0–17.0)
Immature Granulocytes: 0 %
Lymphocytes Relative: 13 %
Lymphs Abs: 1 10*3/uL (ref 0.7–4.0)
MCH: 26.1 pg (ref 26.0–34.0)
MCHC: 28.9 g/dL — ABNORMAL LOW (ref 30.0–36.0)
MCV: 90.3 fL (ref 80.0–100.0)
Monocytes Absolute: 0.3 10*3/uL (ref 0.1–1.0)
Monocytes Relative: 5 %
Neutro Abs: 5.8 10*3/uL (ref 1.7–7.7)
Neutrophils Relative %: 79 %
Platelets: 195 10*3/uL (ref 150–400)
RBC: 3.6 MIL/uL — ABNORMAL LOW (ref 4.22–5.81)
RDW: 17.5 % — ABNORMAL HIGH (ref 11.5–15.5)
WBC: 7.3 10*3/uL (ref 4.0–10.5)
nRBC: 0 % (ref 0.0–0.2)

## 2020-02-16 LAB — FERRITIN: Ferritin: 11 ng/mL — ABNORMAL LOW (ref 24–336)

## 2020-02-16 LAB — IRON AND TIBC
Iron: 44 ug/dL — ABNORMAL LOW (ref 45–182)
Saturation Ratios: 12 % — ABNORMAL LOW (ref 17.9–39.5)
TIBC: 379 ug/dL (ref 250–450)
UIBC: 335 ug/dL

## 2020-02-16 MED ORDER — EPOETIN ALFA-EPBX 40000 UNIT/ML IJ SOLN
40000.0000 [IU] | Freq: Once | INTRAMUSCULAR | Status: AC
  Administered 2020-02-16: 40000 [IU] via SUBCUTANEOUS
  Filled 2020-02-16: qty 1

## 2020-03-09 ENCOUNTER — Other Ambulatory Visit: Payer: Self-pay | Admitting: Oncology

## 2020-03-10 NOTE — Progress Notes (Signed)
[place]  [phone] [phone]) [phone]  ID: [name] OB: 01/07/1946  MR#: [id number]  QMV#:[id number]  Patient Care Team: Derinda Late, MD as PCP - General (Family Medicine)   CHIEF COMPLAINT: Anemia in chronic kidney disease, mild iron deficiency.  INTERVAL HISTORY: Patient returns to clinic today for further evaluation and IV Feraheme infusion.  He has noticed worsening weakness and fatigue over the past several months.  He continues to have chronic back pain.  He has no neurologic complaints.  He denies any chest pain, shortness of breath, cough, or hemoptysis.  He denies any abdominal pain.  He has no nausea, vomiting, constipation, or diarrhea.  He denies any melena or hematochezia.  He has no urinary complaints.  Patient offers no further specific complaints today.  REVIEW OF SYSTEMS:   Review of Systems  Constitutional: Positive for malaise/fatigue. Negative for fever and weight loss.  Respiratory: Negative.  Negative for cough, hemoptysis and shortness of breath.   Cardiovascular: Negative.  Negative for chest pain and leg swelling.  Gastrointestinal: Negative.  Negative for abdominal pain and constipation.  Genitourinary: Negative.  Negative for flank pain and hematuria.  Musculoskeletal: Positive for back pain.  Skin: Negative.  Negative for rash.  Neurological: Positive for weakness. Negative for dizziness, sensory change, focal weakness and headaches.  Psychiatric/Behavioral: Negative.  The patient is not nervous/anxious.     As per HPI. Otherwise, a complete review of systems is negative.  PAST MEDICAL HISTORY: Past Medical History:  Diagnosis Date  . Chronic back pain   . COPD (chronic obstructive pulmonary disease) ([place])   . Depression   . Diabetes mellitus without complication ([place])   . Hyperlipidemia   . Hypertension   . Renal cell carcinoma ([place])   . Sleep apnea     PAST SURGICAL HISTORY: Past Surgical  History:  Procedure Laterality Date  . CARPAL TUNNEL RELEASE    . CHOLECYSTECTOMY    . COLONOSCOPY WITH PROPOFOL N/A 07/29/2017   Procedure: COLONOSCOPY WITH PROPOFOL;  Surgeon: Manya Silvas, MD;  Location: [facility] ENDOSCOPY;  Service: Endoscopy;  Laterality: N/A;  . colonoscopys    . ESOPHAGOGASTRODUODENOSCOPY (EGD) WITH PROPOFOL N/A 07/29/2017   Procedure: ESOPHAGOGASTRODUODENOSCOPY (EGD) WITH PROPOFOL;  Surgeon: Manya Silvas, MD;  Location: [facility] ENDOSCOPY;  Service: Endoscopy;  Laterality: N/A;  . FLEXIBLE SIGMOIDOSCOPY    . GASTRIC BYPASS    . NEPHRECTOMY    . open reduction and internal fixation, right distal radius    . right knee arthroscopy    . UPPER GI ENDOSCOPY      FAMILY HISTORY: Family History  Problem Relation Age of Onset  . Diabetes Mother   . Cancer Mother   . Heart disease Father     ADVANCED DIRECTIVES (Y/N):  N  HEALTH MAINTENANCE: Social History   Tobacco Use  . Smoking status: Former Smoker    Packs/day: 1.00    Years: 30.00    Pack years: 30.00  . Smokeless tobacco: Current User    Types: Chew  Substance Use Topics  . Alcohol use: No  . Drug use: No     Colonoscopy:  PAP:  Bone density:  Lipid panel:  No Known Allergies  Current Outpatient Medications  Medication Sig Dispense Refill  . apixaban (ELIQUIS) 5 MG TABS tablet Take 5 mg by mouth every 12 (twelve) hours.     Marland Kitchen atorvastatin (LIPITOR) 10 MG tablet Take 10 mg by mouth daily.    . carvedilol (COREG)  6.25 MG tablet Take 6.25 mg by mouth 2 (two) times daily with a meal.    . cefdinir (OMNICEF) 300 MG capsule Take 1 capsule (300 mg total) by mouth 2 (two) times daily. 20 capsule 0  . DULoxetine (CYMBALTA) 60 MG capsule 120 mg daily.     . fluticasone (FLONASE) 50 MCG/ACT nasal spray Place 1 spray into both nostrils 2 (two) times daily as needed for allergies or rhinitis.     . furosemide (LASIX) 40 MG tablet Take 40 mg by mouth 2 (two) times daily.    Marland Kitchen glipiZIDE (GLUCOTROL XL)  2.5 MG 24 hr tablet Take 1 tablet (2.5 mg total) by mouth daily with breakfast. 30 tablet 0  . KLOR-CON M20 20 MEQ tablet Take 40 mEq by mouth daily.     . Multiple Vitamin (MULTIVITAMIN) tablet Take 1 tablet by mouth daily.    Marland Kitchen oxyCODONE-acetaminophen (PERCOCET) 10-325 MG tablet Take 1 tablet by mouth every 8 (eight) hours as needed for pain. Must last 30 days. 90 tablet 0  . pantoprazole (PROTONIX) 40 MG tablet Take 40 mg by mouth 2 (two) times daily.     . pramipexole (MIRAPEX) 1 MG tablet Take 1 mg by mouth 2 (two) times a day. Take one tablet by mouth at [facility] and oner tablet at bedtime    . rOPINIRole (REQUIP) 1 MG tablet Take 2 mg by mouth daily.    . sucralfate (CARAFATE) 1 g tablet Take 1 g by mouth 3 (three) times daily.     Marland Kitchen tiZANidine (ZANAFLEX) 2 MG tablet Take 2 mg by mouth every 8 (eight) hours as needed for muscle spasms.      No current facility-administered medications for this visit.   Facility-Administered Medications Ordered in Other Visits  Medication Dose Route Frequency Provider Last Rate Last Admin  . 0.9 %  sodium chloride infusion   Intravenous Continuous Lloyd Huger, MD   Stopped at 03/16/20 1211    OBJECTIVE: Vitals:   03/16/20 1037  Temp: (!) 96.5 F (35.8 C)     Body mass index is 36.23 kg/m.    ECOG FS:0 - Asymptomatic  General: Well-developed, well-nourished, no acute distress. Eyes: Pink conjunctiva, anicteric sclera. HEENT: Normocephalic, moist mucous membranes. Lungs: No audible wheezing or coughing. Heart: Regular rate and rhythm. Abdomen: Soft, nontender, no obvious distention. Musculoskeletal: No edema, cyanosis, or clubbing. Neuro: Alert, answering all questions appropriately. Cranial nerves grossly intact. Skin: No rashes or petechiae noted. Psych: Normal affect.   LAB RESULTS:  Lab Results  Component Value Date   NA 138 01/13/2020   K 4.6 01/13/2020   CL 104 01/13/2020   CO2 25 01/13/2020   GLUCOSE 200 (H) 01/13/2020    BUN 33 (H) 01/13/2020   CREATININE 1.80 (H) 01/13/2020   CALCIUM 8.8 (L) 01/13/2020   PROT 6.3 (L) 01/10/2020   ALBUMIN 3.0 (L) 01/12/2020   AST 20 01/10/2020   ALT 25 01/10/2020   ALKPHOS 148 (H) 01/10/2020   BILITOT 0.4 01/10/2020   GFRNONAA 37 (L) 01/13/2020   GFRAA 43 (L) 01/13/2020    Lab Results  Component Value Date   WBC 8.3 03/16/2020   NEUTROABS 6.7 03/16/2020   HGB 10.4 (L) 03/16/2020   HCT 34.9 (L) 03/16/2020   MCV 89.5 03/16/2020   PLT 202 03/16/2020   Lab Results  Component Value Date   IRON 47 03/16/2020   TIBC 407 03/16/2020   IRONPCTSAT 12 (L) 03/16/2020   Lab Results  Component  Value Date   FERRITIN 7 (L) 03/16/2020     STUDIES: No results found.  ASSESSMENT: Anemia in chronic kidney disease, mild iron deficiency.  PLAN:    1. Anemia in chronic kidney disease, mild iron deficiency: Although patient's hemoglobin is essentially stable at 10.4, his iron stores have declined and he is symptomatic.  Proceed with 510 mg IV Feraheme today.  Return to clinic in 1 week for second infusion.  He does not require Retacrit at this time, but given his chronic renal insufficiency this may be needed in the future if his hemoglobin falls persistently below 10 with normal iron stores.  Return to clinic in 2 months for repeat laboratory work, further evaluation, and continuation of treatment if needed.   2.  Chronic renal insufficiency: Chronic and unchanged.  Appreciate nephrology input.   3.  Vascular insufficiency: Continue follow-up with vascular surgery as scheduled. 4.  Back pain: Chronic and unchanged.  Continue follow-up and treatment per physical therapy.  I spent a total of 30 minutes reviewing chart data, face-to-face evaluation with the patient, counseling and coordination of care as detailed above.  Patient expressed understanding and was in agreement with this plan. He also understands that He can call clinic at any time with any questions, concerns, or  complaints.    Lloyd Huger, MD   03/16/2020 1:33 PM

## 2020-03-16 ENCOUNTER — Inpatient Hospital Stay: Payer: Medicare Other

## 2020-03-16 ENCOUNTER — Encounter: Payer: Self-pay | Admitting: Oncology

## 2020-03-16 ENCOUNTER — Other Ambulatory Visit: Payer: Self-pay

## 2020-03-16 ENCOUNTER — Inpatient Hospital Stay: Payer: Medicare Other | Attending: Oncology | Admitting: Oncology

## 2020-03-16 VITALS — BP 167/81 | HR 84 | Temp 96.5°F | Resp 20

## 2020-03-16 VITALS — Temp 96.5°F | Wt 267.1 lb

## 2020-03-16 DIAGNOSIS — G8929 Other chronic pain: Secondary | ICD-10-CM | POA: Diagnosis not present

## 2020-03-16 DIAGNOSIS — I129 Hypertensive chronic kidney disease with stage 1 through stage 4 chronic kidney disease, or unspecified chronic kidney disease: Secondary | ICD-10-CM | POA: Diagnosis not present

## 2020-03-16 DIAGNOSIS — M549 Dorsalgia, unspecified: Secondary | ICD-10-CM | POA: Insufficient documentation

## 2020-03-16 DIAGNOSIS — E1122 Type 2 diabetes mellitus with diabetic chronic kidney disease: Secondary | ICD-10-CM | POA: Insufficient documentation

## 2020-03-16 DIAGNOSIS — N189 Chronic kidney disease, unspecified: Secondary | ICD-10-CM | POA: Diagnosis not present

## 2020-03-16 DIAGNOSIS — Z79899 Other long term (current) drug therapy: Secondary | ICD-10-CM | POA: Diagnosis not present

## 2020-03-16 DIAGNOSIS — D509 Iron deficiency anemia, unspecified: Secondary | ICD-10-CM

## 2020-03-16 DIAGNOSIS — D631 Anemia in chronic kidney disease: Secondary | ICD-10-CM | POA: Diagnosis not present

## 2020-03-16 LAB — CBC WITH DIFFERENTIAL/PLATELET
Abs Immature Granulocytes: 0.03 10*3/uL (ref 0.00–0.07)
Basophils Absolute: 0 10*3/uL (ref 0.0–0.1)
Basophils Relative: 0 %
Eosinophils Absolute: 0.2 10*3/uL (ref 0.0–0.5)
Eosinophils Relative: 2 %
HCT: 34.9 % — ABNORMAL LOW (ref 39.0–52.0)
Hemoglobin: 10.4 g/dL — ABNORMAL LOW (ref 13.0–17.0)
Immature Granulocytes: 0 %
Lymphocytes Relative: 11 %
Lymphs Abs: 0.9 10*3/uL (ref 0.7–4.0)
MCH: 26.7 pg (ref 26.0–34.0)
MCHC: 29.8 g/dL — ABNORMAL LOW (ref 30.0–36.0)
MCV: 89.5 fL (ref 80.0–100.0)
Monocytes Absolute: 0.4 10*3/uL (ref 0.1–1.0)
Monocytes Relative: 5 %
Neutro Abs: 6.7 10*3/uL (ref 1.7–7.7)
Neutrophils Relative %: 82 %
Platelets: 202 10*3/uL (ref 150–400)
RBC: 3.9 MIL/uL — ABNORMAL LOW (ref 4.22–5.81)
RDW: 17.3 % — ABNORMAL HIGH (ref 11.5–15.5)
WBC: 8.3 10*3/uL (ref 4.0–10.5)
nRBC: 0 % (ref 0.0–0.2)

## 2020-03-16 LAB — IRON AND TIBC
Iron: 47 ug/dL (ref 45–182)
Saturation Ratios: 12 % — ABNORMAL LOW (ref 17.9–39.5)
TIBC: 407 ug/dL (ref 250–450)
UIBC: 360 ug/dL

## 2020-03-16 LAB — FERRITIN: Ferritin: 7 ng/mL — ABNORMAL LOW (ref 24–336)

## 2020-03-16 MED ORDER — SODIUM CHLORIDE 0.9 % IV SOLN
INTRAVENOUS | Status: DC
  Filled 2020-03-16: qty 250

## 2020-03-16 MED ORDER — SODIUM CHLORIDE 0.9 % IV SOLN
510.0000 mg | Freq: Once | INTRAVENOUS | Status: AC
  Administered 2020-03-16: 510 mg via INTRAVENOUS
  Filled 2020-03-16: qty 510

## 2020-03-16 NOTE — Progress Notes (Signed)
Patient is here for follow up and complains of weakness and feeling tired. States no strength. Kidney provider told patient his iron level is low.

## 2020-03-22 ENCOUNTER — Ambulatory Visit: Payer: Medicare Other | Admitting: Oncology

## 2020-03-22 ENCOUNTER — Ambulatory Visit: Payer: Medicare Other

## 2020-03-22 ENCOUNTER — Other Ambulatory Visit: Payer: Medicare Other

## 2020-03-23 ENCOUNTER — Other Ambulatory Visit: Payer: Self-pay

## 2020-03-23 ENCOUNTER — Inpatient Hospital Stay: Payer: Medicare Other

## 2020-03-23 VITALS — BP 135/63 | HR 79 | Temp 97.1°F | Resp 18

## 2020-03-23 DIAGNOSIS — D509 Iron deficiency anemia, unspecified: Secondary | ICD-10-CM

## 2020-03-23 DIAGNOSIS — R0602 Shortness of breath: Secondary | ICD-10-CM | POA: Diagnosis not present

## 2020-03-23 DIAGNOSIS — J189 Pneumonia, unspecified organism: Secondary | ICD-10-CM | POA: Diagnosis not present

## 2020-03-23 MED ORDER — SODIUM CHLORIDE 0.9 % IV SOLN
510.0000 mg | Freq: Once | INTRAVENOUS | Status: AC
  Administered 2020-03-23: 510 mg via INTRAVENOUS
  Filled 2020-03-23: qty 510

## 2020-03-25 ENCOUNTER — Inpatient Hospital Stay: Payer: Medicare Other

## 2020-03-25 ENCOUNTER — Encounter: Payer: Self-pay | Admitting: Emergency Medicine

## 2020-03-25 ENCOUNTER — Emergency Department: Payer: Medicare Other

## 2020-03-25 ENCOUNTER — Inpatient Hospital Stay
Admission: EM | Admit: 2020-03-25 | Discharge: 2020-03-27 | DRG: 193 | Disposition: A | Discharge status: Home / Self Care | Payer: Medicare Other | Attending: Internal Medicine | Admitting: Internal Medicine

## 2020-03-25 ENCOUNTER — Other Ambulatory Visit: Payer: Self-pay

## 2020-03-25 DIAGNOSIS — Z7901 Long term (current) use of anticoagulants: Secondary | ICD-10-CM | POA: Diagnosis not present

## 2020-03-25 DIAGNOSIS — N189 Chronic kidney disease, unspecified: Secondary | ICD-10-CM | POA: Diagnosis not present

## 2020-03-25 DIAGNOSIS — Z7984 Long term (current) use of oral hypoglycemic drugs: Secondary | ICD-10-CM

## 2020-03-25 DIAGNOSIS — F329 Major depressive disorder, single episode, unspecified: Secondary | ICD-10-CM | POA: Diagnosis present

## 2020-03-25 DIAGNOSIS — J449 Chronic obstructive pulmonary disease, unspecified: Secondary | ICD-10-CM | POA: Diagnosis not present

## 2020-03-25 DIAGNOSIS — Z905 Acquired absence of kidney: Secondary | ICD-10-CM

## 2020-03-25 DIAGNOSIS — M549 Dorsalgia, unspecified: Secondary | ICD-10-CM | POA: Diagnosis present

## 2020-03-25 DIAGNOSIS — Z9884 Bariatric surgery status: Secondary | ICD-10-CM

## 2020-03-25 DIAGNOSIS — E785 Hyperlipidemia, unspecified: Secondary | ICD-10-CM | POA: Diagnosis present

## 2020-03-25 DIAGNOSIS — Z9049 Acquired absence of other specified parts of digestive tract: Secondary | ICD-10-CM

## 2020-03-25 DIAGNOSIS — Z8249 Family history of ischemic heart disease and other diseases of the circulatory system: Secondary | ICD-10-CM

## 2020-03-25 DIAGNOSIS — I82542 Chronic embolism and thrombosis of left tibial vein: Secondary | ICD-10-CM | POA: Diagnosis present

## 2020-03-25 DIAGNOSIS — I129 Hypertensive chronic kidney disease with stage 1 through stage 4 chronic kidney disease, or unspecified chronic kidney disease: Secondary | ICD-10-CM | POA: Diagnosis present

## 2020-03-25 DIAGNOSIS — Z833 Family history of diabetes mellitus: Secondary | ICD-10-CM

## 2020-03-25 DIAGNOSIS — I10 Essential (primary) hypertension: Secondary | ICD-10-CM | POA: Diagnosis not present

## 2020-03-25 DIAGNOSIS — Z20822 Contact with and (suspected) exposure to covid-19: Secondary | ICD-10-CM | POA: Diagnosis present

## 2020-03-25 DIAGNOSIS — N1831 Chronic kidney disease, stage 3a: Secondary | ICD-10-CM | POA: Diagnosis present

## 2020-03-25 DIAGNOSIS — G8929 Other chronic pain: Secondary | ICD-10-CM | POA: Diagnosis present

## 2020-03-25 DIAGNOSIS — K219 Gastro-esophageal reflux disease without esophagitis: Secondary | ICD-10-CM | POA: Diagnosis present

## 2020-03-25 DIAGNOSIS — J9621 Acute and chronic respiratory failure with hypoxia: Secondary | ICD-10-CM | POA: Diagnosis present

## 2020-03-25 DIAGNOSIS — I878 Other specified disorders of veins: Secondary | ICD-10-CM | POA: Diagnosis present

## 2020-03-25 DIAGNOSIS — E1129 Type 2 diabetes mellitus with other diabetic kidney complication: Secondary | ICD-10-CM | POA: Diagnosis present

## 2020-03-25 DIAGNOSIS — G4733 Obstructive sleep apnea (adult) (pediatric): Secondary | ICD-10-CM | POA: Diagnosis present

## 2020-03-25 DIAGNOSIS — R739 Hyperglycemia, unspecified: Secondary | ICD-10-CM

## 2020-03-25 DIAGNOSIS — Z85528 Personal history of other malignant neoplasm of kidney: Secondary | ICD-10-CM | POA: Diagnosis not present

## 2020-03-25 DIAGNOSIS — F32A Depression, unspecified: Secondary | ICD-10-CM | POA: Diagnosis present

## 2020-03-25 DIAGNOSIS — Z79899 Other long term (current) drug therapy: Secondary | ICD-10-CM

## 2020-03-25 DIAGNOSIS — D631 Anemia in chronic kidney disease: Secondary | ICD-10-CM | POA: Diagnosis present

## 2020-03-25 DIAGNOSIS — I82409 Acute embolism and thrombosis of unspecified deep veins of unspecified lower extremity: Secondary | ICD-10-CM

## 2020-03-25 DIAGNOSIS — J439 Emphysema, unspecified: Secondary | ICD-10-CM | POA: Diagnosis present

## 2020-03-25 DIAGNOSIS — R0602 Shortness of breath: Secondary | ICD-10-CM | POA: Diagnosis present

## 2020-03-25 DIAGNOSIS — E1165 Type 2 diabetes mellitus with hyperglycemia: Secondary | ICD-10-CM | POA: Diagnosis present

## 2020-03-25 DIAGNOSIS — E1122 Type 2 diabetes mellitus with diabetic chronic kidney disease: Secondary | ICD-10-CM | POA: Diagnosis present

## 2020-03-25 DIAGNOSIS — Z86718 Personal history of other venous thrombosis and embolism: Secondary | ICD-10-CM

## 2020-03-25 DIAGNOSIS — F17220 Nicotine dependence, chewing tobacco, uncomplicated: Secondary | ICD-10-CM | POA: Diagnosis present

## 2020-03-25 DIAGNOSIS — J189 Pneumonia, unspecified organism: Secondary | ICD-10-CM | POA: Diagnosis present

## 2020-03-25 DIAGNOSIS — N183 Chronic kidney disease, stage 3 unspecified: Secondary | ICD-10-CM | POA: Diagnosis present

## 2020-03-25 DIAGNOSIS — G2581 Restless legs syndrome: Secondary | ICD-10-CM | POA: Diagnosis present

## 2020-03-25 DIAGNOSIS — Z79891 Long term (current) use of opiate analgesic: Secondary | ICD-10-CM

## 2020-03-25 LAB — HIV ANTIBODY (ROUTINE TESTING W REFLEX): HIV Screen 4th Generation wRfx: NONREACTIVE

## 2020-03-25 LAB — STREP PNEUMONIAE URINARY ANTIGEN: Strep Pneumo Urinary Antigen: NEGATIVE

## 2020-03-25 LAB — COMPREHENSIVE METABOLIC PANEL
ALT: 21 U/L (ref 0–44)
AST: 13 U/L — ABNORMAL LOW (ref 15–41)
Albumin: 3.7 g/dL (ref 3.5–5.0)
Alkaline Phosphatase: 185 U/L — ABNORMAL HIGH (ref 38–126)
Anion gap: 8 (ref 5–15)
BUN: 43 mg/dL — ABNORMAL HIGH (ref 8–23)
CO2: 18 mmol/L — ABNORMAL LOW (ref 22–32)
Calcium: 8.8 mg/dL — ABNORMAL LOW (ref 8.9–10.3)
Chloride: 113 mmol/L — ABNORMAL HIGH (ref 98–111)
Creatinine, Ser: 1.6 mg/dL — ABNORMAL HIGH (ref 0.61–1.24)
GFR calc Af Amer: 49 mL/min — ABNORMAL LOW (ref >60)
GFR calc non Af Amer: 43 mL/min — ABNORMAL LOW (ref >60)
Glucose, Bld: 316 mg/dL — ABNORMAL HIGH (ref 70–99)
Potassium: 4.4 mmol/L (ref 3.5–5.1)
Sodium: 139 mmol/L (ref 135–145)
Total Bilirubin: 0.7 mg/dL (ref 0.3–1.2)
Total Protein: 7.3 g/dL (ref 6.5–8.1)

## 2020-03-25 LAB — CBC WITH DIFFERENTIAL/PLATELET
Abs Immature Granulocytes: 0.04 10*3/uL (ref 0.00–0.07)
Basophils Absolute: 0 10*3/uL (ref 0.0–0.1)
Basophils Relative: 0 %
Eosinophils Absolute: 0.1 10*3/uL (ref 0.0–0.5)
Eosinophils Relative: 1 %
HCT: 37.3 % — ABNORMAL LOW (ref 39.0–52.0)
Hemoglobin: 11.1 g/dL — ABNORMAL LOW (ref 13.0–17.0)
Immature Granulocytes: 0 %
Lymphocytes Relative: 5 %
Lymphs Abs: 0.6 10*3/uL — ABNORMAL LOW (ref 0.7–4.0)
MCH: 27.1 pg (ref 26.0–34.0)
MCHC: 29.8 g/dL — ABNORMAL LOW (ref 30.0–36.0)
MCV: 91 fL (ref 80.0–100.0)
Monocytes Absolute: 0.5 10*3/uL (ref 0.1–1.0)
Monocytes Relative: 5 %
Neutro Abs: 9.8 10*3/uL — ABNORMAL HIGH (ref 1.7–7.7)
Neutrophils Relative %: 89 %
Platelets: 204 10*3/uL (ref 150–400)
RBC: 4.1 MIL/uL — ABNORMAL LOW (ref 4.22–5.81)
RDW: 18.5 % — ABNORMAL HIGH (ref 11.5–15.5)
WBC: 11.1 10*3/uL — ABNORMAL HIGH (ref 4.0–10.5)
nRBC: 0 % (ref 0.0–0.2)

## 2020-03-25 LAB — GLUCOSE, CAPILLARY
Glucose-Capillary: 269 mg/dL — ABNORMAL HIGH (ref 70–99)
Glucose-Capillary: 384 mg/dL — ABNORMAL HIGH (ref 70–99)
Glucose-Capillary: 395 mg/dL — ABNORMAL HIGH (ref 70–99)
Glucose-Capillary: 384 mg/dL — ABNORMAL HIGH (ref 70–99)

## 2020-03-25 LAB — TROPONIN I (HIGH SENSITIVITY): Troponin I (High Sensitivity): 7 ng/L (ref <18)

## 2020-03-25 LAB — PROCALCITONIN: Procalcitonin: 0.9 ng/mL

## 2020-03-25 LAB — RESPIRATORY PANEL BY RT PCR (FLU A&B, COVID)
Influenza A by PCR: NEGATIVE
Influenza B by PCR: NEGATIVE
SARS Coronavirus 2 by RT PCR: NEGATIVE

## 2020-03-25 LAB — BRAIN NATRIURETIC PEPTIDE: B Natriuretic Peptide: 31 pg/mL (ref 0.0–100.0)

## 2020-03-25 LAB — LACTIC ACID, PLASMA: Lactic Acid, Venous: 1.1 mmol/L (ref 0.5–1.9)

## 2020-03-25 MED ORDER — ROPINIROLE HCL 1 MG PO TABS
2.0000 mg | ORAL_TABLET | Freq: Every day | ORAL | Status: DC
  Administered 2020-03-26 – 2020-03-27 (×2): 2 mg via ORAL
  Filled 2020-03-25 (×3): qty 2

## 2020-03-25 MED ORDER — ALBUTEROL SULFATE (2.5 MG/3ML) 0.083% IN NEBU
5.0000 mg | INHALATION_SOLUTION | Freq: Once | RESPIRATORY_TRACT | Status: DC
  Filled 2020-03-25: qty 6

## 2020-03-25 MED ORDER — SODIUM CHLORIDE 0.9 % IV SOLN
500.0000 mg | Freq: Once | INTRAVENOUS | Status: AC
  Administered 2020-03-25: 500 mg via INTRAVENOUS
  Filled 2020-03-25: qty 500

## 2020-03-25 MED ORDER — FUROSEMIDE 10 MG/ML IJ SOLN
40.0000 mg | Freq: Once | INTRAMUSCULAR | Status: AC
  Administered 2020-03-25: 06:00:00 40 mg via INTRAVENOUS
  Filled 2020-03-25: qty 4

## 2020-03-25 MED ORDER — APIXABAN 5 MG PO TABS
5.0000 mg | ORAL_TABLET | Freq: Two times a day (BID) | ORAL | Status: DC
  Administered 2020-03-25 – 2020-03-27 (×4): 5 mg via ORAL
  Filled 2020-03-25 (×4): qty 1

## 2020-03-25 MED ORDER — ONDANSETRON HCL 4 MG/2ML IJ SOLN: 4.0000 mg | Freq: Three times a day (TID) | INTRAMUSCULAR | Status: DC | PRN

## 2020-03-25 MED ORDER — OXYCODONE-ACETAMINOPHEN 5-325 MG PO TABS
1.0000 | ORAL_TABLET | Freq: Four times a day (QID) | ORAL | Status: DC | PRN
  Administered 2020-03-25 – 2020-03-26 (×3): 1 via ORAL
  Filled 2020-03-25 (×3): qty 1

## 2020-03-25 MED ORDER — IPRATROPIUM-ALBUTEROL 0.5-2.5 (3) MG/3ML IN SOLN
3.0000 mL | Freq: Once | RESPIRATORY_TRACT | Status: AC
  Administered 2020-03-25: 3 mL via RESPIRATORY_TRACT
  Filled 2020-03-25: qty 3

## 2020-03-25 MED ORDER — ACETAMINOPHEN 325 MG PO TABS: 650.0000 mg | ORAL_TABLET | Freq: Four times a day (QID) | ORAL | Status: DC | PRN

## 2020-03-25 MED ORDER — IPRATROPIUM BROMIDE 0.02 % IN SOLN
0.5000 mg | RESPIRATORY_TRACT | Status: DC
  Administered 2020-03-25 (×3): 0.5 mg via RESPIRATORY_TRACT
  Filled 2020-03-25 (×3): qty 2.5

## 2020-03-25 MED ORDER — TIZANIDINE HCL 2 MG PO TABS
2.0000 mg | ORAL_TABLET | Freq: Three times a day (TID) | ORAL | Status: DC | PRN
  Filled 2020-03-25: qty 1

## 2020-03-25 MED ORDER — CARVEDILOL 6.25 MG PO TABS
6.2500 mg | ORAL_TABLET | Freq: Two times a day (BID) | ORAL | Status: DC
  Administered 2020-03-25 – 2020-03-27 (×4): 6.25 mg via ORAL
  Filled 2020-03-25 (×4): qty 1

## 2020-03-25 MED ORDER — IPRATROPIUM BROMIDE HFA 17 MCG/ACT IN AERS: 2.0000 | INHALATION_SPRAY | RESPIRATORY_TRACT | Status: DC

## 2020-03-25 MED ORDER — SODIUM CHLORIDE 0.9 % IV SOLN
1.0000 g | Freq: Once | INTRAVENOUS | Status: AC
  Administered 2020-03-25: 1 g via INTRAVENOUS
  Filled 2020-03-25: qty 10

## 2020-03-25 MED ORDER — PANTOPRAZOLE SODIUM 40 MG PO TBEC
40.0000 mg | DELAYED_RELEASE_TABLET | Freq: Two times a day (BID) | ORAL | Status: DC
  Administered 2020-03-25 – 2020-03-27 (×4): 40 mg via ORAL
  Filled 2020-03-25 (×4): qty 1

## 2020-03-25 MED ORDER — TRIAMCINOLONE ACETONIDE 0.1 % EX CREA
1.0000 "application " | TOPICAL_CREAM | Freq: Two times a day (BID) | CUTANEOUS | Status: DC
  Administered 2020-03-25 – 2020-03-26 (×2): 1 via TOPICAL
  Filled 2020-03-25 (×2): qty 15

## 2020-03-25 MED ORDER — HYDRALAZINE HCL 20 MG/ML IJ SOLN: 5.0000 mg | INTRAMUSCULAR | Status: DC | PRN

## 2020-03-25 MED ORDER — ROPINIROLE HCL 1 MG PO TABS
2.0000 mg | ORAL_TABLET | Freq: Two times a day (BID) | ORAL | Status: DC
  Administered 2020-03-25 – 2020-03-27 (×4): 2 mg via ORAL
  Filled 2020-03-25 (×4): qty 2

## 2020-03-25 MED ORDER — DM-GUAIFENESIN ER 30-600 MG PO TB12
1.0000 | ORAL_TABLET | Freq: Two times a day (BID) | ORAL | Status: DC
  Administered 2020-03-25 – 2020-03-27 (×5): 1 via ORAL
  Filled 2020-03-25 (×5): qty 1

## 2020-03-25 MED ORDER — ALBUTEROL SULFATE HFA 108 (90 BASE) MCG/ACT IN AERS: 2.0000 | INHALATION_SPRAY | RESPIRATORY_TRACT | Status: DC | PRN

## 2020-03-25 MED ORDER — SODIUM CHLORIDE 0.9 % IV SOLN
1.0000 g | INTRAVENOUS | Status: DC
  Administered 2020-03-26: 1 g via INTRAVENOUS
  Filled 2020-03-25: qty 10
  Filled 2020-03-25: qty 1

## 2020-03-25 MED ORDER — OXYCODONE-ACETAMINOPHEN 10-325 MG PO TABS: 1.0000 | ORAL_TABLET | Freq: Four times a day (QID) | ORAL | Status: DC | PRN

## 2020-03-25 MED ORDER — METHYLPREDNISOLONE SODIUM SUCC 125 MG IJ SOLR
125.0000 mg | Freq: Once | INTRAMUSCULAR | Status: AC
  Administered 2020-03-25: 125 mg via INTRAVENOUS
  Filled 2020-03-25: qty 2

## 2020-03-25 MED ORDER — OXYCODONE HCL 5 MG PO TABS
5.0000 mg | ORAL_TABLET | Freq: Four times a day (QID) | ORAL | Status: DC | PRN
  Administered 2020-03-25 – 2020-03-26 (×3): 5 mg via ORAL
  Filled 2020-03-25 (×3): qty 1

## 2020-03-25 MED ORDER — FUROSEMIDE 40 MG PO TABS
40.0000 mg | ORAL_TABLET | Freq: Two times a day (BID) | ORAL | Status: DC
  Administered 2020-03-26 – 2020-03-27 (×3): 40 mg via ORAL
  Filled 2020-03-25 (×3): qty 1

## 2020-03-25 MED ORDER — INSULIN ASPART 100 UNIT/ML ~~LOC~~ SOLN
0.0000 [IU] | Freq: Every day | SUBCUTANEOUS | Status: DC
  Administered 2020-03-25: 5 [IU] via SUBCUTANEOUS
  Administered 2020-03-26: 4 [IU] via SUBCUTANEOUS
  Filled 2020-03-25 (×2): qty 1

## 2020-03-25 MED ORDER — IPRATROPIUM BROMIDE 0.02 % IN SOLN
0.5000 mg | Freq: Four times a day (QID) | RESPIRATORY_TRACT | Status: DC
  Administered 2020-03-25 – 2020-03-27 (×5): 0.5 mg via RESPIRATORY_TRACT
  Filled 2020-03-25 (×6): qty 2.5

## 2020-03-25 MED ORDER — ALBUTEROL SULFATE (2.5 MG/3ML) 0.083% IN NEBU: 2.5000 mg | INHALATION_SOLUTION | RESPIRATORY_TRACT | Status: DC | PRN

## 2020-03-25 MED ORDER — SODIUM CHLORIDE 0.9 % IV SOLN
500.0000 mg | INTRAVENOUS | Status: DC
  Administered 2020-03-26: 11:00:00 500 mg via INTRAVENOUS
  Filled 2020-03-25: qty 500

## 2020-03-25 MED ORDER — ADULT MULTIVITAMIN W/MINERALS CH
1.0000 | ORAL_TABLET | Freq: Every day | ORAL | Status: DC
  Administered 2020-03-26 – 2020-03-27 (×2): 1 via ORAL
  Filled 2020-03-25 (×2): qty 1

## 2020-03-25 MED ORDER — ATORVASTATIN CALCIUM 10 MG PO TABS
10.0000 mg | ORAL_TABLET | Freq: Every day | ORAL | Status: DC
  Administered 2020-03-26 – 2020-03-27 (×2): 10 mg via ORAL
  Filled 2020-03-25 (×3): qty 1

## 2020-03-25 MED ORDER — DULOXETINE HCL 30 MG PO CPEP
120.0000 mg | ORAL_CAPSULE | Freq: Every day | ORAL | Status: DC
  Administered 2020-03-26 – 2020-03-27 (×2): 120 mg via ORAL
  Filled 2020-03-25 (×2): qty 4

## 2020-03-25 MED ORDER — SUCRALFATE 1 G PO TABS
1.0000 g | ORAL_TABLET | Freq: Three times a day (TID) | ORAL | Status: DC
  Administered 2020-03-25 – 2020-03-27 (×6): 1 g via ORAL
  Filled 2020-03-25 (×6): qty 1

## 2020-03-25 MED ORDER — ROPINIROLE HCL 1 MG PO TABS
2.0000 mg | ORAL_TABLET | Freq: Once | ORAL | Status: AC
  Administered 2020-03-25: 2 mg via ORAL

## 2020-03-25 MED ORDER — INSULIN ASPART 100 UNIT/ML ~~LOC~~ SOLN
0.0000 [IU] | Freq: Three times a day (TID) | SUBCUTANEOUS | Status: DC
  Administered 2020-03-25 (×2): 9 [IU] via SUBCUTANEOUS
  Administered 2020-03-25: 5 [IU] via SUBCUTANEOUS
  Administered 2020-03-26: 18:00:00 7 [IU] via SUBCUTANEOUS
  Administered 2020-03-26 (×2): 5 [IU] via SUBCUTANEOUS
  Administered 2020-03-27: 12:00:00 3 [IU] via SUBCUTANEOUS
  Administered 2020-03-27: 09:00:00 5 [IU] via SUBCUTANEOUS
  Filled 2020-03-25 (×8): qty 1

## 2020-03-25 NOTE — ED Triage Notes (Signed)
Pt arrives POV to triage with c/o SOB tonight. Pt reports having O2 saturations of 70's and 88%. Pt has known edema but denies CHF. Pt is experiencing a hard work of breathing at this time.

## 2020-03-25 NOTE — Consult Note (Signed)
[facility] Vascular Consult Note  MRN : [id number]  [name] is a 74 y.o. (03/05/1946) male who presents with chief complaint of Shortness of breath and cough; Left leg swelling. Chief Complaint  Patient presents with  . Shortness of Breath  .  History of Present Illness:Patient well known to service for history of DVT and lymphedema. Past medical history significant of hypertension, hyperlipidemia, diabetes mellitus, COPD, GERD, depression, OSA, renal cell cancer (s/p of nephrectomy), chronic back pain, DVT on Eliquis, CKD stage III, anemia, who presents with shortness of breath. He noted the left leg continues to have swelling with redness and a few ulcerations/scabs. He has not worn compression socks, UNNA boots of the lymphedema pump in recent months consistently.  Current Facility-Administered Medications  Medication Dose Route Frequency Provider Last Rate Last Admin  . acetaminophen (TYLENOL) tablet 650 mg  650 mg Oral Q6H PRN Ivor Costa, MD      . albuterol (PROVENTIL) (2.5 MG/3ML) 0.083% nebulizer solution 2.5 mg  2.5 mg Nebulization Q4H PRN Ivor Costa, MD      . Derrill Memo ON 03/26/2020] azithromycin (ZITHROMAX) 500 mg in sodium chloride 0.9 % 250 mL IVPB  500 mg Intravenous Q24H Ivor Costa, MD      . Derrill Memo ON 03/26/2020] cefTRIAXone (ROCEPHIN) 1 g in sodium chloride 0.9 % 100 mL IVPB  1 g Intravenous Q24H Ivor Costa, MD      . dextromethorphan-guaiFENesin [facility] DM) 30-600 MG per 12 hr tablet 1 tablet  1 tablet Oral BID Ivor Costa, MD   1 tablet at 03/25/20 1941  . hydrALAZINE (APRESOLINE) injection 5 mg  5 mg Intravenous Q2H PRN Ivor Costa, MD      . insulin aspart (novoLOG) injection 0-5 Units  0-5 Units Subcutaneous QHS Ivor Costa, MD      . insulin aspart (novoLOG) injection 0-9 Units  0-9 Units Subcutaneous TID WC Ivor Costa, MD   5 Units at 03/25/20 [phone]  . ipratropium (ATROVENT) nebulizer solution 0.5 mg  0.5 mg Nebulization Q4H Ivor Costa,  MD   0.5 mg at 03/25/20 1448  . ondansetron (ZOFRAN) injection 4 mg  4 mg Intravenous Q8H PRN Ivor Costa, MD       Current Outpatient Medications  Medication Sig Dispense Refill  . apixaban (ELIQUIS) 5 MG TABS tablet Take 5 mg by mouth every 12 (twelve) hours.     Marland Kitchen atorvastatin (LIPITOR) 10 MG tablet Take 10 mg by mouth daily.    . carvedilol (COREG) 6.25 MG tablet Take 6.25 mg by mouth 2 (two) times daily with a meal.    . DULoxetine (CYMBALTA) 60 MG capsule 120 mg daily.     . furosemide (LASIX) 40 MG tablet Take 40 mg by mouth 2 (two) times daily.    Marland Kitchen glipiZIDE (GLUCOTROL XL) 2.5 MG 24 hr tablet Take 1 tablet (2.5 mg total) by mouth daily with breakfast. 30 tablet 0  . Multiple Vitamin (MULTIVITAMIN) tablet Take 1 tablet by mouth daily.    Marland Kitchen oxyCODONE-acetaminophen (PERCOCET) 10-325 MG tablet Take 1 tablet by mouth every 8 (eight) hours as needed for pain. Must last 30 days. 90 tablet 0  . pantoprazole (PROTONIX) 40 MG tablet Take 40 mg by mouth 2 (two) times daily.     . potassium chloride (KLOR-CON) 20 MEQ packet Take 20 mEq by mouth daily.     Marland Kitchen rOPINIRole (REQUIP) 1 MG tablet Take 2 mg by mouth daily.    . sucralfate (CARAFATE) 1  g tablet Take 1 g by mouth 3 (three) times daily.     Marland Kitchen tiZANidine (ZANAFLEX) 2 MG tablet Take 2 mg by mouth every 8 (eight) hours as needed for muscle spasms.     Marland Kitchen triamcinolone cream (KENALOG) 0.1 % Apply 1 application topically 2 (two) times daily.       Past Medical History:  Diagnosis Date  . Chronic back pain   . COPD (chronic obstructive pulmonary disease) ([place])   . Depression   . Diabetes mellitus without complication ([place])   . Hyperlipidemia   . Hypertension   . Renal cell carcinoma ([place])   . Sleep apnea     Past Surgical History:  Procedure Laterality Date  . CARPAL TUNNEL RELEASE    . CHOLECYSTECTOMY    . COLONOSCOPY WITH PROPOFOL N/A 07/29/2017   Procedure: COLONOSCOPY WITH PROPOFOL;  Surgeon: Manya Silvas, MD;  Location: [facility]  ENDOSCOPY;  Service: Endoscopy;  Laterality: N/A;  . colonoscopys    . ESOPHAGOGASTRODUODENOSCOPY (EGD) WITH PROPOFOL N/A 07/29/2017   Procedure: ESOPHAGOGASTRODUODENOSCOPY (EGD) WITH PROPOFOL;  Surgeon: Manya Silvas, MD;  Location: [facility] ENDOSCOPY;  Service: Endoscopy;  Laterality: N/A;  . FLEXIBLE SIGMOIDOSCOPY    . GASTRIC BYPASS    . NEPHRECTOMY    . open reduction and internal fixation, right distal radius    . right knee arthroscopy    . UPPER GI ENDOSCOPY      Social History Social History   Tobacco Use  . Smoking status: Former Smoker    Packs/day: 1.00    Years: 30.00    Pack years: 30.00  . Smokeless tobacco: Current User    Types: Chew  Substance Use Topics  . Alcohol use: No  . Drug use: No    Family History Family History  Problem Relation Age of Onset  . Diabetes Mother   . Cancer Mother   . Heart disease Father     No Known Allergies   REVIEW OF SYSTEMS (Negative unless checked)  Constitutional: [] Weight loss  [] Fever  [] Chills Cardiac: [] Chest pain   [] Chest pressure   [] Palpitations   [] Shortness of breath when laying flat   [] Shortness of breath at rest   [x] Shortness of breath with exertion. Vascular:  [] Pain in legs with walking   [] Pain in legs at rest   [] Pain in legs when laying flat   [] Claudication   [] Pain in feet when walking  [] Pain in feet at rest  [] Pain in feet when laying flat   [x] History of DVT   [] Phlebitis   [x] Swelling in legs   [x] Varicose veins   [] Non-healing ulcers Pulmonary:   [] Uses home oxygen   [] Productive cough   [] Hemoptysis   [] Wheeze  [x] COPD   [] Asthma Neurologic:  [] Dizziness  [] Blackouts   [] Seizures   [] History of stroke   [] History of TIA  [] Aphasia   [] Temporary blindness   [] Dysphagia   [] Weakness or numbness in arms   [] Weakness or numbness in legs Musculoskeletal:  [] Arthritis   [] Joint swelling   [] Joint pain   [] Low back pain Hematologic:  [] Easy bruising  [] Easy bleeding   [] Hypercoagulable state   [] Anemic   [] Hepatitis Gastrointestinal:  [] Blood in stool   [] Vomiting blood  [] Gastroesophageal reflux/heartburn   [] Difficulty swallowing. Genitourinary:  [x] Chronic kidney disease   [] Difficult urination  [] Frequent urination  [] Burning with urination   [] Blood in urine Skin:  [] Rashes   [x] Ulcers   [] Wounds Psychological:  [] History of anxiety   []  History  of major depression.  Physical Examination  Vitals:   03/25/20 1000 03/25/20 1015 03/25/20 1030 03/25/20 1100  BP:  138/71 (!) 109/57 109/72  Pulse:    86  Resp: (!) 29 (!) 24 (!) 22 (!) 29  Temp:      TempSrc:      SpO2:    97%  Weight:      Height:       Body mass index is 36.35 kg/m. Gen:  WD/WN, NAD Head: [place]/AT, No temporalis wasting. Prominent temp pulse not noted. Ear/Nose/Throat: Hearing grossly intact, nares w/o erythema or drainage, oropharynx w/o Erythema/Exudate Eyes: Sclera non-icteric, conjunctiva clear Neck: Trachea midline.  No JVD.  Pulmonary:  Good air movement, respirations not labored, equal bilaterally.  Cardiac: RRR, normal S1, S2. Vascular:  Vessel Right Left  Radial Palpable Palpable  Ulnar Palpable Palpable  Brachial Palpable Palpable  Carotid Palpable, without bruit Palpable, without bruit  Aorta Not palpable N/A  Femoral Palpable Palpable  Popliteal Palpable Palpable  PT Palpable Palpable  DP Palpable Palpable   Gastrointestinal: soft, non-tender/non-distended. No guarding/reflex.  Musculoskeletal: M/S 5/5 throughout.  Extremities without ischemic changes.  No deformity or atrophy. No edema. Neurologic: Sensation grossly intact in extremities.  Symmetrical.  Speech is fluent. Motor exam as listed above. Psychiatric: Judgment intact, Mood & affect appropriate for pt's clinical situation. Dermatologic: No rashes or ulcers noted.  No cellulitis or open wounds. Lymph : No Cervical, Axillary, or Inguinal lymphadenopathy.      CBC Lab Results  Component Value Date   WBC 11.1 (H) 03/25/2020   HGB  11.1 (L) 03/25/2020   HCT 37.3 (L) 03/25/2020   MCV 91.0 03/25/2020   PLT 204 03/25/2020    BMET    Component Value Date/Time   NA 139 03/25/2020 0557   NA 142 01/10/2015 1626   K 4.4 03/25/2020 0557   K 4.3 01/10/2015 1626   CL 113 (H) 03/25/2020 0557   CL 110 (H) 01/10/2015 1626   CO2 18 (L) 03/25/2020 0557   CO2 25 01/10/2015 1626   GLUCOSE 316 (H) 03/25/2020 0557   GLUCOSE 144 (H) 01/10/2015 1626   BUN 43 (H) 03/25/2020 0557   BUN 24 (H) 01/10/2015 1626   CREATININE 1.60 (H) 03/25/2020 0557   CREATININE 1.13 01/10/2015 1626   CALCIUM 8.8 (L) 03/25/2020 0557   CALCIUM 9.2 01/10/2015 1626   GFRNONAA 43 (L) 03/25/2020 0557   GFRNONAA >60 01/10/2015 1626   GFRNONAA >60 06/24/2014 0538   GFRAA 49 (L) 03/25/2020 0557   GFRAA >60 01/10/2015 1626   GFRAA >60 06/24/2014 0538   Estimated Creatinine Clearance: 57 mL/min (A) (by C-G formula based on SCr of 1.6 mg/dL (H)).  COAG Lab Results  Component Value Date   INR 1.1 01/11/2020   INR 1.1 06/22/2014   INR 1.2 02/13/2014    Radiology US Venous Img Lower Unilateral Left (DVT)  Result Date: 03/25/2020 CLINICAL DATA:  Evaluate for left lower extremity venous thrombus. EXAM: LEFT LOWER EXTREMITY VENOUS DOPPLER ULTRASOUND TECHNIQUE: Gray-scale sonography with compression, as well as color and duplex ultrasound, were performed to evaluate the deep venous system(s) from the level of the common femoral vein through the popliteal and proximal calf veins. COMPARISON:  01/10/2020 and 01/20/2017 FINDINGS: VENOUS Normal compressibility of the common femoral, superficial femoral, and popliteal veins. Visualized portions of profunda femoral vein and great saphenous vein unremarkable. No filling defects to suggest DVT on grayscale or color Doppler imaging. Doppler waveforms show normal direction of  venous flow, normal respiratory phasicity and response to augmentation. Occlusive noncompressible thrombus is present over the veins of the lower  leg involving the posterior tibial and peroneal veins. This may be acute or chronic as patient had similar findings and February 2018. Limited views of the contralateral common femoral vein are unremarkable. OTHER None. Limitations: none IMPRESSION: Occlusive noncompressible thrombus evident in the left lower leg veins involving the posterior tibial and peroneal veins as this may be acute or chronic. Patient had similar findings in 2018. Left common femoral to popliteal veins are normal. Electronically Signed   By: Marin Olp M.D.   On: 03/25/2020 10:40   DG Chest Port 1 View  Result Date: 03/25/2020 CLINICAL DATA:  Initial evaluation for acute shortness of breath. EXAM: PORTABLE CHEST 1 VIEW COMPARISON:  Prior radiograph from 11/22/2019. FINDINGS: Cardiomegaly. Mediastinal silhouette within normal limits. Lungs hypoinflated. Parenchymal opacity seen within the mid and lower left lung, concerning for pneumonia. Underlying pulmonary interstitial congestion without overt pulmonary edema. No pleural effusion. No pneumothorax. No acute osseous finding. IMPRESSION: Patchy opacity within the mid and lower left lung, concerning for pneumonia. Electronically Signed   By: Jeannine Boga M.D.   On: 03/25/2020 06:43      Assessment/Plan 1. Bilateral Lower extremity edema- Left>Right No evidence of acute DVT. Chronic tibial vein DVT and lymphedema. Chronic erythema with previous minimal results with compression and UNNA boots.  He has not worn UNNA boots in sometime and had new ulcerations.  Would recommend Wound Care consult and UNNA boot placement on LEFT leg. Evaluate for other contributing etiology- CHF, Renal Disease.  No further Vascular Surgery intervention Recommended at this time. Should follow Up in the office upon discharge.   Evaristo Bury, MD  03/25/2020 11:22 AM    This note was created with Dragon medical transcription system.  Any error is purely unintentional

## 2020-03-25 NOTE — ED Provider Notes (Signed)
[facility] Emergency Department Provider Note   ____________________________________________   First MD Initiated Contact with Patient 03/25/20 0540     (approximate)  I have reviewed the triage vital signs and the nursing notes.   HISTORY  Chief Complaint Shortness of Breath    HPI [name] is a 74 y.o. male with a history of COPD, peripheral edema, diabetes, hyperlipidemia, renal cell carcinoma status post nephrectomy who presents to the ED from home with a chief complaint of shortness of breath.  Patient reports progressive shortness of breath over the past 1 to 2 days, worse tonight.  Has a pulse oximeter at home and reports O2 saturations 70s-88%.  Endorses dry cough.  Denies fever, chest pain, abdominal pain, nausea, vomiting or dizziness.  Denies recent travel or trauma.  Denies known COVID-19 exposure.       Past Medical History:  Diagnosis Date  . Chronic back pain   . COPD (chronic obstructive pulmonary disease) ([place])   . Depression   . Diabetes mellitus without complication ([place])   . Hyperlipidemia   . Hypertension   . Renal cell carcinoma ([place])   . Sleep apnea     Patient Active Problem List   Diagnosis Date Noted  . Cellulitis 01/10/2020  . Proteinuria 09/20/2019  . Hyperkalemia 07/05/2019  . Lumbar degenerative disc disease 05/11/2019  . Spinal stenosis, lumbar region, with neurogenic claudication 05/11/2019  . Ankylosis of lumbar spine 05/11/2019  . Neuroforaminal stenosis of lumbar spine (left, L5/S1) 05/11/2019  . Lumbar facet arthropathy 05/11/2019  . Chronic pain syndrome 05/11/2019  . Chronic obstructive pulmonary disease ([place]) 01/03/2019  . Atherosclerotic peripheral vascular disease ([place]) 09/01/2018  . Lymphedema 06/22/2018  . Iron deficiency anemia 05/01/2018  . Anemia in chronic kidney disease 04/26/2018  . Varicose veins of leg with swelling, bilateral 04/09/2018  . SOB (shortness of breath) on  exertion 03/17/2018  . Bilateral lower extremity edema 02/23/2018  . Lower extremity pain, bilateral 02/23/2018  . Essential hypertension 02/23/2018  . H/O deep venous thrombosis 01/23/2017  . Diabetes ([place]) 06/19/2014  . Chronic kidney disease, unspecified 06/19/2014  . Anxiety state 06/19/2014  . Other and unspecified hyperlipidemia 06/19/2014    Past Surgical History:  Procedure Laterality Date  . CARPAL TUNNEL RELEASE    . CHOLECYSTECTOMY    . COLONOSCOPY WITH PROPOFOL N/A 07/29/2017   Procedure: COLONOSCOPY WITH PROPOFOL;  Surgeon: Manya Silvas, MD;  [facility] ENDOSCOPY;  Service: Endoscopy;  Laterality: N/A;  . colonoscopys    . ESOPHAGOGASTRODUODENOSCOPY (EGD) WITH PROPOFOL N/A 07/29/2017   Procedure: ESOPHAGOGASTRODUODENOSCOPY (EGD) WITH PROPOFOL;  Surgeon: Manya Silvas, MD;  Location: [facility] ENDOSCOPY;  Service: Endoscopy;  Laterality: N/A;  . FLEXIBLE SIGMOIDOSCOPY    . GASTRIC BYPASS    . NEPHRECTOMY    . open reduction and internal fixation, right distal radius    . right knee arthroscopy    . UPPER GI ENDOSCOPY      Prior to Admission medications   Medication Sig Start Date End Date Taking? Authorizing Provider  apixaban (ELIQUIS) 5 MG TABS tablet Take 5 mg by mouth every 12 (twelve) hours.     [provider]  atorvastatin (LIPITOR) 10 MG tablet Take 10 mg by mouth daily.    [provider]  carvedilol (COREG) 6.25 MG tablet Take 6.25 mg by mouth 2 (two) times daily with a meal.    [provider]  cefdinir (OMNICEF) 300 MG capsule Take 1 capsule (300  mg total) by mouth 2 (two) times daily. 01/13/20   Danford, Suann Larry, MD  DULoxetine (CYMBALTA) 60 MG capsule 120 mg daily.  09/01/19   [provider]  fluticasone (FLONASE) 50 MCG/ACT nasal spray Place 1 spray into both nostrils 2 (two) times daily as needed for allergies or rhinitis.     [provider]  furosemide (LASIX) 40 MG tablet Take 40 mg by mouth 2  (two) times daily.    [provider]  glipiZIDE (GLUCOTROL XL) 2.5 MG 24 hr tablet Take 1 tablet (2.5 mg total) by mouth daily with breakfast. 07/08/19   Leslye Peer, Richard, MD  KLOR-CON M20 20 MEQ tablet Take 40 mEq by mouth daily.  11/01/19   [provider]  Multiple Vitamin (MULTIVITAMIN) tablet Take 1 tablet by mouth daily.    [provider]  oxyCODONE-acetaminophen (PERCOCET) 10-325 MG tablet Take 1 tablet by mouth every 8 (eight) hours as needed for pain. Must last 30 days. 03/10/20 04/09/20  Gillis Santa, MD  pantoprazole (PROTONIX) 40 MG tablet Take 40 mg by mouth 2 (two) times daily.     [provider]  pramipexole (MIRAPEX) 1 MG tablet Take 1 mg by mouth 2 (two) times a day. Take one tablet by mouth at [facility] and oner tablet at bedtime 05/11/19   [provider]  rOPINIRole (REQUIP) 1 MG tablet Take 2 mg by mouth daily.    [provider]  sucralfate (CARAFATE) 1 g tablet Take 1 g by mouth 3 (three) times daily.     [provider]  tiZANidine (ZANAFLEX) 2 MG tablet Take 2 mg by mouth every 8 (eight) hours as needed for muscle spasms.     [provider]    Allergies Patient has no known allergies.  Family History  Problem Relation Age of Onset  . Diabetes Mother   . Cancer Mother   . Heart disease Father     Social History Social History   Tobacco Use  . Smoking status: Former Smoker    Packs/day: 1.00    Years: 30.00    Pack years: 30.00  . Smokeless tobacco: Current User    Types: Chew  Substance Use Topics  . Alcohol use: No  . Drug use: No    Review of Systems  Constitutional: No fever/chills Eyes: No visual changes. ENT: No sore throat. Cardiovascular: Denies chest pain. Respiratory: Positive for cough and shortness of breath. Gastrointestinal: No abdominal pain.  No nausea, no vomiting.  No diarrhea.  No constipation. Genitourinary: Negative for dysuria. Musculoskeletal: Negative for  back pain. Skin: Negative for rash. Neurological: Negative for headaches, focal weakness or numbness.   ____________________________________________   PHYSICAL EXAM:  VITAL SIGNS: ED Triage Vitals [03/25/20 0526]  Enc Vitals Group     BP (!) 160/58     Pulse Rate 85     Resp (!) 24     Temp 98.4 F (36.9 C)     Temp Source Oral     SpO2 95 %     Weight 268 lb (121.6 kg)     Height 6' (1.829 m)     Head Circumference      Peak Flow      Pain Score      Pain Loc      Pain Edu?      Excl. in [place]?     Constitutional: Alert and oriented. Well appearing and in mild to moderate acute distress. Eyes: Conjunctivae are normal. PERRL.  EOMI. Head: Atraumatic. Nose: No congestion/rhinnorhea. Mouth/Throat: Mucous membranes are moist.   Neck: No stridor.   Cardiovascular: Normal rate, regular rhythm. Grossly normal heart sounds.  Good peripheral circulation. Respiratory: Increased respiratory effort.  No retractions. Lungs with bibasilar rales. Gastrointestinal: Soft and nontender. No distention. No abdominal bruits. No CVA tenderness. Musculoskeletal: No lower extremity tenderness.  2+ BLE pitting edema.  No joint effusions. Neurologic:  Normal speech and language. No gross focal neurologic deficits are appreciated.  Skin:  Skin is warm, dry and intact. No rash noted. Psychiatric: Mood and affect are normal. Speech and behavior are normal.  ____________________________________________   LABS (all labs ordered are listed, but only abnormal results are displayed)  Labs Reviewed  CBC WITH DIFFERENTIAL/PLATELET - Abnormal; Notable for the following components:      Result Value   WBC 11.1 (*)    RBC 4.10 (*)    Hemoglobin 11.1 (*)    HCT 37.3 (*)    MCHC 29.8 (*)    RDW 18.5 (*)    Neutro Abs 9.8 (*)    Lymphs Abs 0.6 (*)    All other components within normal limits  COMPREHENSIVE METABOLIC PANEL - Abnormal; Notable for the following components:   Chloride 113 (*)    CO2  18 (*)    Glucose, Bld 316 (*)    BUN 43 (*)    Creatinine, Ser 1.60 (*)    Calcium 8.8 (*)    AST 13 (*)    Alkaline Phosphatase 185 (*)    GFR calc non Af Amer 43 (*)    GFR calc Af Amer 49 (*)    All other components within normal limits  RESPIRATORY PANEL BY RT PCR (FLU A&B, COVID)  CULTURE, BLOOD (ROUTINE X 2)  CULTURE, BLOOD (ROUTINE X 2)  BRAIN NATRIURETIC PEPTIDE  LACTIC ACID, PLASMA  LACTIC ACID, PLASMA  PROCALCITONIN  TROPONIN I (HIGH SENSITIVITY)   ____________________________________________  EKG  ED ECG REPORT I, Carnita Golob J, the attending physician, personally viewed and interpreted this ECG.   Date: 03/25/2020  EKG Time: 0536  Rate: 98  Rhythm: normal EKG, normal sinus rhythm  Axis: Normal  Intervals:right bundle branch block  ST&T Change: Nonspecific No significant change from 07/15/2019 ____________________________________________  RADIOLOGY  ED MD interpretation: Patchy opacity left lung concerning for pneumonia  Official radiology report(s): DG Chest Port 1 View  Result Date: 03/25/2020 CLINICAL DATA:  Initial evaluation for acute shortness of breath. EXAM: PORTABLE CHEST 1 VIEW COMPARISON:  Prior radiograph from 11/22/2019. FINDINGS: Cardiomegaly. Mediastinal silhouette within normal limits. Lungs hypoinflated. Parenchymal opacity seen within the mid and lower left lung, concerning for pneumonia. Underlying pulmonary interstitial congestion without overt pulmonary edema. No pleural effusion. No pneumothorax. No acute osseous finding. IMPRESSION: Patchy opacity within the mid and lower left lung, concerning for pneumonia. Electronically Signed   By: Jeannine Boga M.D.   On: 03/25/2020 06:43    ____________________________________________   PROCEDURES  Procedure(s) performed (including Critical Care):  .1-3 Lead EKG Interpretation Performed by: Paulette Blanch, MD Authorized by: Paulette Blanch, MD     Interpretation: normal     ECG rate:   95   ECG rate assessment: normal     Rhythm: sinus rhythm     Ectopy: none     Conduction: normal     CRITICAL CARE Performed by: Paulette Blanch   Total critical care time: 30 minutes  Critical care time was exclusive of separately billable procedures and treating other  patients.  Critical care was necessary to treat or prevent imminent or life-threatening deterioration.  Critical care was time spent personally by me on the following activities: development of treatment plan with patient and/or surrogate as well as nursing, discussions with consultants, evaluation of patient's response to treatment, examination of patient, obtaining history from patient or surrogate, ordering and performing treatments and interventions, ordering and review of laboratory studies, ordering and review of radiographic studies, pulse oximetry and re-evaluation of patient's condition.   ____________________________________________   INITIAL IMPRESSION / ASSESSMENT AND PLAN / ED COURSE  As part of my medical decision making, I reviewed the following data within the [place] History obtained from family, Nursing notes reviewed and incorporated, Labs reviewed, EKG interpreted, Old EKG reviewed, Old chart reviewed, Radiograph reviewed, Discussed with admitting physician and Notes from prior ED visits     Aarnav Steagall was evaluated in Emergency Department on 03/25/2020 for the symptoms described in the history of present illness. He was evaluated in the context of the global COVID-19 pandemic, which necessitated consideration that the patient might be at risk for infection with the SARS-CoV-2 virus that causes COVID-19. Institutional protocols and algorithms that pertain to the evaluation of patients at risk for COVID-19 are in a state of rapid change based on information released by regulatory bodies including the CDC and federal and state organizations. These policies and algorithms were  followed during the patient's care in the ED.    74 year old male presenting with shortness of breath. Differential includes, but is not limited to, viral syndrome, bronchitis including COPD exacerbation, pneumonia, reactive airway disease including asthma, CHF including exacerbation with or without pulmonary/interstitial edema, pneumothorax, ACS, thoracic trauma, and pulmonary embolism.  History obtained via both patient and his spouse.  I have personally reviewed patient's old EKG as well as his old records, EKG and chest x-ray.  Patient had a recent 4/8 pulmonology visit for DOE, pulmonary emphysema.  Told to better balance his fluids.   Clinical Course as of Mar 25 649  Sun Mar 25, 2020  [id number] Updated patient and spouse of all test results.  Will initiate IV antibiotics for community-acquired pneumonia.  Will discuss with hospitalist services for admission.   [JS]    Clinical Course User Index [JS] Paulette Blanch, MD     ____________________________________________   FINAL CLINICAL IMPRESSION(S) / ED DIAGNOSES  Final diagnoses:  Shortness of breath  Hyperglycemia  Community acquired pneumonia of left lung, unspecified part of lung     ED Discharge Orders    None       Note:  This document was prepared using Dragon voice recognition software and may include unintentional dictation errors.   Paulette Blanch, MD 03/26/20 Zettie Cooley

## 2020-03-25 NOTE — H&P (Addendum)
History and Physical    [name] XLK:[id number] DOB: 1946-06-23 DOA: 03/25/2020  Referring MD/NP/PA:   PCP: Curtis Late, MD   Patient coming from:  The patient is coming from home.  At baseline, pt is independent for most of ADL.        Chief Complaint: SOB  HPI: [name] is a 74 y.o. male with medical history significant of hypertension, hyperlipidemia, diabetes mellitus, COPD, GERD, depression, OSA, renal cell cancer (s/p of nephrectomy), chronic back pain, DVT on Eliquis, CKD stage III, anemia, who presents with shortness of breath.  Patient states that he has been having shortness of breath for almost 2 days, which has worsened since last night.  He has dry cough.  No chest pain, fever or chills.  Pt reports having O2 saturations of 70's and 88%. Pt has hx of left leg DVT.  He has been taking Eliquis for 6 years, but still has left leg swelling and some tenderness.  Patient states that he has history of gastric bypass.  He has been having chronic intermittent loose stool bowel movement after he had gastric bypass surgery.  Patient has some nausea, no vomiting or abdominal pain.  No symptoms of UTI or unilateral weakness.  ED Course: pt was found to have WBC 11.1, negative Covid PCR, troponin VII, BNP 31, lactic acid 1.1, stable renal function, temperature normal, blood pressure 168/76, heart rate 99, RR 33, oxygen saturation 97% on 2 L nasal cannula oxygen.  Chest x-ray showed patchy infiltration in LML and LLL. Pt is admitted to progressive unit as inpatient  Review of Systems:   General: no fevers, chills, no body weight gain, has fatigue HEENT: no blurry vision, hearing changes or sore throat Respiratory: has dyspnea, coughing, no wheezing CV: no chest pain, no palpitations GI: has nausea, no vomiting, abdominal pain, has diarrhea, no constipation GU: no dysuria, burning on urination, increased urinary frequency, hematuria  Ext: has leg edema Neuro: no  unilateral weakness, numbness, or tingling, no vision change or hearing loss Skin: no rash, no skin tear. MSK: No muscle spasm, no deformity, no limitation of range of movement in spin Heme: No easy bruising.  Travel history: No recent long distant travel.  Allergy: No Known Allergies  Past Medical History:  Diagnosis Date  . Chronic back pain   . COPD (chronic obstructive pulmonary disease) ([place])   . Depression   . Diabetes mellitus without complication ([place])   . Hyperlipidemia   . Hypertension   . Renal cell carcinoma ([place])   . Sleep apnea     Past Surgical History:  Procedure Laterality Date  . CARPAL TUNNEL RELEASE    . CHOLECYSTECTOMY    . COLONOSCOPY WITH PROPOFOL N/A 07/29/2017   Procedure: COLONOSCOPY WITH PROPOFOL;  Surgeon: Curtis Silvas, MD;  Location: [facility] ENDOSCOPY;  Service: Endoscopy;  Laterality: N/A;  . colonoscopys    . ESOPHAGOGASTRODUODENOSCOPY (EGD) WITH PROPOFOL N/A 07/29/2017   Procedure: ESOPHAGOGASTRODUODENOSCOPY (EGD) WITH PROPOFOL;  Surgeon: Curtis Silvas, MD;  Location: [facility] ENDOSCOPY;  Service: Endoscopy;  Laterality: N/A;  . FLEXIBLE SIGMOIDOSCOPY    . GASTRIC BYPASS    . NEPHRECTOMY    . open reduction and internal fixation, right distal radius    . right knee arthroscopy    . UPPER GI ENDOSCOPY      Social History:  reports that he has quit smoking. He has a 30.00 pack-year smoking history. His smokeless tobacco use includes chew. He reports that he  does not drink alcohol or use drugs.  Family History:  Family History  Problem Relation Age of Onset  . Diabetes Mother   . Cancer Mother   . Heart disease Father      Prior to Admission medications   Medication Sig Start Date End Date Taking? Authorizing Provider  apixaban (ELIQUIS) 5 MG TABS tablet Take 5 mg by mouth every 12 (twelve) hours.     [provider]  atorvastatin (LIPITOR) 10 MG tablet Take 10 mg by mouth daily.    [provider]  carvedilol (COREG)  6.25 MG tablet Take 6.25 mg by mouth 2 (two) times daily with a meal.    [provider]  cefdinir (OMNICEF) 300 MG capsule Take 1 capsule (300 mg total) by mouth 2 (two) times daily. 01/13/20   Schmitt, Curtis Larry, MD  DULoxetine (CYMBALTA) 60 MG capsule 120 mg daily.  09/01/19   [provider]  fluticasone (FLONASE) 50 MCG/ACT nasal spray Place 1 spray into both nostrils 2 (two) times daily as needed for allergies or rhinitis.     [provider]  furosemide (LASIX) 40 MG tablet Take 40 mg by mouth 2 (two) times daily.    [provider]  glipiZIDE (GLUCOTROL XL) 2.5 MG 24 hr tablet Take 1 tablet (2.5 mg total) by mouth daily with breakfast. 07/08/19   [name], Richard, MD  KLOR-CON M20 20 MEQ tablet Take 40 mEq by mouth daily.  11/01/19   [provider]  Multiple Vitamin (MULTIVITAMIN) tablet Take 1 tablet by mouth daily.    [provider]  oxyCODONE-acetaminophen (PERCOCET) 10-325 MG tablet Take 1 tablet by mouth every 8 (eight) hours as needed for pain. Must last 30 days. 03/10/20 04/09/20  Curtis Santa, MD  pantoprazole (PROTONIX) 40 MG tablet Take 40 mg by mouth 2 (two) times daily.     [provider]  pramipexole (MIRAPEX) 1 MG tablet Take 1 mg by mouth 2 (two) times a day. Take one tablet by mouth at [facility] and oner tablet at bedtime 05/11/19   [provider]  rOPINIRole (REQUIP) 1 MG tablet Take 2 mg by mouth daily.    [provider]  sucralfate (CARAFATE) 1 g tablet Take 1 g by mouth 3 (three) times daily.     [provider]  tiZANidine (ZANAFLEX) 2 MG tablet Take 2 mg by mouth every 8 (eight) hours as needed for muscle spasms.     [provider]    Physical Exam: Vitals:   03/25/20 0526 03/25/20 0600  BP: (!) 160/58 (!) 168/76  Pulse: 85 99  Resp: (!) 24 (!) 33  Temp: 98.4 F (36.9 C)   TempSrc: Oral   SpO2: 95% 97%  Weight: 121.6 kg   Height: 6' (1.829 m)    General: Not in  acute distress HEENT:       Eyes: PERRL, EOMI, no scleral icterus.       ENT: No discharge from the ears and nose, no pharynx injection, no tonsillar enlargement.        Neck: No JVD, no bruit, no mass felt. Heme: No neck lymph node enlargement. Cardiac: S1/S2, RRR, No murmurs, No gallops or rubs. Respiratory: Has scattered rhonchi bilaterally GI: Soft, nondistended, nontender, no rebound pain, no organomegaly, BS present. GU: No hematuria Ext: 1+DP/PT pulse bilaterally. Has 2+ left leg edema with mild tenderness, but no warmth. Has trace leg edema in right leg. Musculoskeletal: No joint deformities, No joint redness or  warmth, no limitation of ROM in spin. Skin: No rashes.  Neuro: Alert, oriented X3, cranial nerves II-XII grossly intact, moves all extremities normally. Psych: Patient is not psychotic, no suicidal or hemocidal ideation.  Labs on Admission: I have personally reviewed following labs and imaging studies  CBC: Recent Labs  Lab 03/25/20 0557  WBC 11.1*  NEUTROABS 9.8*  HGB 11.1*  HCT 37.3*  MCV 91.0  PLT 518   Basic Metabolic Panel: Recent Labs  Lab 03/25/20 0557  NA 139  K 4.4  CL 113*  CO2 18*  GLUCOSE 316*  BUN 43*  CREATININE 1.60*  CALCIUM 8.8*   GFR: Estimated Creatinine Clearance: 57 mL/min (A) (by C-G formula based on SCr of 1.6 mg/dL (H)). Liver Function Tests: Recent Labs  Lab 03/25/20 0557  AST 13*  ALT 21  ALKPHOS 185*  BILITOT 0.7  PROT 7.3  ALBUMIN 3.7   No results for input(s): LIPASE, AMYLASE in the last 168 hours. No results for input(s): AMMONIA in the last 168 hours. Coagulation Profile: No results for input(s): INR, PROTIME in the last 168 hours. Cardiac Enzymes: No results for input(s): CKTOTAL, CKMB, CKMBINDEX, TROPONINI in the last 168 hours. BNP (last 3 results) No results for input(s): PROBNP in the last 8760 hours. HbA1C: No results for input(s): HGBA1C in the last 72 hours. CBG: No results for input(s): GLUCAP in  the last 168 hours. Lipid Profile: No results for input(s): CHOL, HDL, LDLCALC, TRIG, CHOLHDL, LDLDIRECT in the last 72 hours. Thyroid Function Tests: No results for input(s): TSH, T4TOTAL, FREET4, T3FREE, THYROIDAB in the last 72 hours. Anemia Panel: No results for input(s): VITAMINB12, FOLATE, FERRITIN, TIBC, IRON, RETICCTPCT in the last 72 hours. Urine analysis:    Component Value Date/Time   COLORURINE STRAW (A) 01/24/2019 1250   APPEARANCEUR CLEAR (A) 01/24/2019 1250   APPEARANCEUR Clear 06/22/2014 2019   LABSPEC 1.010 01/24/2019 1250   LABSPEC 1.015 06/22/2014 2019   PHURINE 5.0 01/24/2019 1250   GLUCOSEU NEGATIVE 01/24/2019 1250   GLUCOSEU Negative 06/22/2014 2019   HGBUR SMALL (A) 01/24/2019 1250   BILIRUBINUR NEGATIVE 01/24/2019 1250   BILIRUBINUR Negative 06/22/2014 2019   KETONESUR NEGATIVE 01/24/2019 1250   PROTEINUR 30 (A) 01/24/2019 1250   NITRITE NEGATIVE 01/24/2019 1250   LEUKOCYTESUR NEGATIVE 01/24/2019 1250   LEUKOCYTESUR Negative 06/22/2014 2019   Sepsis Labs: @LABRCNTIP (procalcitonin:4,lacticidven:4) ) Recent Results (from the past 240 hour(s))  Respiratory Panel by RT PCR (Flu A&B, Covid) - Nasopharyngeal Swab     Status: None   Collection Time: 03/25/20  5:57 AM   Specimen: Nasopharyngeal Swab  Result Value Ref Range Status   SARS Coronavirus 2 by RT PCR NEGATIVE NEGATIVE Final    Comment: (NOTE) SARS-CoV-2 target nucleic acids are NOT DETECTED. The SARS-CoV-2 RNA is generally detectable in upper respiratoy specimens during the acute phase of infection. The lowest concentration of SARS-CoV-2 viral copies this assay can detect is 131 copies/mL. A negative result does not preclude SARS-Cov-2 infection and should not be used as the sole basis for treatment or other patient management decisions. A negative result may occur with  improper specimen collection/handling, submission of specimen other than nasopharyngeal swab, presence of viral mutation(s)  within the areas targeted by this assay, and inadequate number of viral copies (<131 copies/mL). A negative result must be combined with clinical observations, patient history, and epidemiological information. The expected result is Negative. Fact Sheet for Patients:  PinkCheek.be Fact Sheet for Healthcare Providers:  GravelBags.it This test is  not yet ap proved or cleared by the [place] and  has been authorized for detection and/or diagnosis of SARS-CoV-2 by FDA under an Emergency Use Authorization (EUA). This EUA will remain  in effect (meaning this test can be used) for the duration of the COVID-19 declaration under Section 564(b)(1) of the Act, 21 U.S.C. section 360bbb-3(b)(1), unless the authorization is terminated or revoked sooner.    Influenza A by PCR NEGATIVE NEGATIVE Final   Influenza B by PCR NEGATIVE NEGATIVE Final    Comment: (NOTE) The Xpert Xpress SARS-CoV-2/FLU/RSV assay is intended as an aid in  the diagnosis of influenza from Nasopharyngeal swab specimens and  should not be used as a sole basis for treatment. Nasal washings and  aspirates are unacceptable for Xpert Xpress SARS-CoV-2/FLU/RSV  testing. Fact Sheet for Patients: PinkCheek.be Fact Sheet for Healthcare Providers: GravelBags.it This test is not yet approved or cleared by the [place] FDA and  has been authorized for detection and/or diagnosis of SARS-CoV-2 by  FDA under an Emergency Use Authorization (EUA). This EUA will remain  in effect (meaning this test can be used) for the duration of the  Covid-19 declaration under Section 564(b)(1) of the Act, 21  U.S.C. section 360bbb-3(b)(1), unless the authorization is  terminated or revoked. Performed at [facility], [address], [place], [place] [address]      Radiological Exams on Admission: DG Chest [facility] 1  View  Result Date: 03/25/2020 CLINICAL DATA:  Initial evaluation for acute shortness of breath. EXAM: PORTABLE CHEST 1 VIEW COMPARISON:  Prior radiograph from 11/22/2019. FINDINGS: Cardiomegaly. Mediastinal silhouette within normal limits. Lungs hypoinflated. Parenchymal opacity seen within the mid and lower left lung, concerning for pneumonia. Underlying pulmonary interstitial congestion without overt pulmonary edema. No pleural effusion. No pneumothorax. No acute osseous finding. IMPRESSION: Patchy opacity within the mid and lower left lung, concerning for pneumonia. Electronically Signed   By: Jeannine Boga M.D.   On: 03/25/2020 06:43     EKG: Independently reviewed.  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  No fever.  Lactic acid normal.  Clinically not septic.  Chest x-ray with patchy infiltration in LML and LLL. Pt is Eliquis for DVT, no chest pain.  Low suspicions for PE.  No history of CHF.  2D echo on 02/09/2018 showed EF 60-85%.  BNP 31, less likely to have CHF.  Patient received 40 mg IV Lasix in ED.   -Admitted to progressive unit as inpatient - IV Rocephin and azithromycin - Mucinex for cough  - Bronchodilators - Urine legionella and S. pneumococcal antigen - Follow up blood culture x2, sputum culture   HTN:  -Continue home medications: Coreg, Lasix -hydralazine prn  Type II  diabetes mellitus with renal manifestations ([place]): Most recent A1c 7.2, poorly controled. Patient is taking glipizide at home -SSI  Anemia in chronic kidney disease: Hgb stable, 11.1 -f/u by CBC  H/O deep venous thrombosis-left  leg: Pt has hx of left leg DVT.  He has been taking Eliquis for 6 years, but still has left leg swelling and some tenderness.   -continue Eliquis -will repeat LE venous doppler -will consult Dr. Lorenso Courier of [facility]  Hyperlipidemia -lipitor  COPD (chronic obstructive pulmonary disease) [facility]): Patient is scattered rhonchi on auscultation.  Patient received 125 mg of Solu-Medrol in ED. -Bronchodilators as above  Depression:  -Cymbalta, pramipexole, Requip  CKD (chronic kidney disease), stage IIIa: Renal function close to baseline.  Baseline creatinine 1.3-1.5.  His creatinine is 1.60, BUN 43 -Follow-up [facility] by [facility]   Inpatient status:  # Patient requires inpatient status due to high intensity of service, high risk for further deterioration and high frequency of surveillance required.  I certify that at the point of admission it is my clinical judgment that the patient will require inpatient hospital care spanning beyond 2 midnights from the point of admission.  . This patient has multiple chronic comorbidities including hypertension, hyperlipidemia, diabetes mellitus, COPD, GERD, depression, OSA, renal cell cancer (s/p of nephrectomy), chronic back pain, DVT on Eliquis, CKD stage III, anemia. . Now patient has presenting with acute on chronic respiratory failure with hypoxia due to CAP.  Marland Kitchen The worrisome physical exam findings include bilateral lower leg edema . The initial radiographic and laboratory data are worrisome because of infiltration in LML and LLL on CXR. Has leukocytosis. . Current medical needs: please see my assessment and plan . Predictability of an adverse outcome (risk): Patient has multiple comorbidities as listed above. Now presents with acute  on chronic respiratory failure with hypoxia due to CAP. Patient's presentation is highly complicated.  Patient is at high risk of deteriorating.  Will need to be treated in hospital for at least 2 days.         DVT ppx: on Eliquis Code Status: Full code Family Communication:    Yes, patient's wife  at bed side Disposition Plan:  Anticipate discharge back to previous home environment Consults called: [facility], Dr. Lorenso Courier Admission status: progressive unit as inpt     Date of Service 03/25/2020    [facility]   If 7PM-7AM, please contact night-coverage [email or web address] 03/25/2020, 8:07 AM

## 2020-03-26 DIAGNOSIS — N189 Chronic kidney disease, unspecified: Secondary | ICD-10-CM | POA: Diagnosis not present

## 2020-03-26 DIAGNOSIS — J9621 Acute and chronic respiratory failure with hypoxia: Secondary | ICD-10-CM | POA: Diagnosis not present

## 2020-03-26 DIAGNOSIS — D631 Anemia in chronic kidney disease: Secondary | ICD-10-CM

## 2020-03-26 DIAGNOSIS — I82409 Acute embolism and thrombosis of unspecified deep veins of unspecified lower extremity: Secondary | ICD-10-CM

## 2020-03-26 DIAGNOSIS — J189 Pneumonia, unspecified organism: Secondary | ICD-10-CM | POA: Diagnosis not present

## 2020-03-26 DIAGNOSIS — E1122 Type 2 diabetes mellitus with diabetic chronic kidney disease: Secondary | ICD-10-CM

## 2020-03-26 LAB — CBC
HCT: 33.4 % — ABNORMAL LOW (ref 39.0–52.0)
Hemoglobin: 10.1 g/dL — ABNORMAL LOW (ref 13.0–17.0)
MCH: 27 pg (ref 26.0–34.0)
MCHC: 30.2 g/dL (ref 30.0–36.0)
MCV: 89.3 fL (ref 80.0–100.0)
Platelets: 197 10*3/uL (ref 150–400)
RBC: 3.74 MIL/uL — ABNORMAL LOW (ref 4.22–5.81)
RDW: 18.8 % — ABNORMAL HIGH (ref 11.5–15.5)
WBC: 11.8 10*3/uL — ABNORMAL HIGH (ref 4.0–10.5)
nRBC: 0 % (ref 0.0–0.2)

## 2020-03-26 LAB — BASIC METABOLIC PANEL
Anion gap: 7 (ref 5–15)
BUN: 44 mg/dL — ABNORMAL HIGH (ref 8–23)
CO2: 18 mmol/L — ABNORMAL LOW (ref 22–32)
Calcium: 8.7 mg/dL — ABNORMAL LOW (ref 8.9–10.3)
Chloride: 110 mmol/L (ref 98–111)
Creatinine, Ser: 1.67 mg/dL — ABNORMAL HIGH (ref 0.61–1.24)
GFR calc Af Amer: 47 mL/min — ABNORMAL LOW (ref >60)
GFR calc non Af Amer: 41 mL/min — ABNORMAL LOW (ref >60)
Glucose, Bld: 274 mg/dL — ABNORMAL HIGH (ref 70–99)
Potassium: 4 mmol/L (ref 3.5–5.1)
Sodium: 135 mmol/L (ref 135–145)

## 2020-03-26 LAB — GLUCOSE, CAPILLARY
Glucose-Capillary: 274 mg/dL — ABNORMAL HIGH (ref 70–99)
Glucose-Capillary: 292 mg/dL — ABNORMAL HIGH (ref 70–99)
Glucose-Capillary: 305 mg/dL — ABNORMAL HIGH (ref 70–99)
Glucose-Capillary: 337 mg/dL — ABNORMAL HIGH (ref 70–99)

## 2020-03-26 MED ORDER — GLIPIZIDE ER 5 MG PO TB24
5.0000 mg | ORAL_TABLET | Freq: Every day | ORAL | Status: DC
  Administered 2020-03-27: 09:00:00 5 mg via ORAL
  Filled 2020-03-26: qty 1

## 2020-03-26 MED ORDER — AZITHROMYCIN 250 MG PO TABS
250.0000 mg | ORAL_TABLET | Freq: Every day | ORAL | Status: DC
  Administered 2020-03-26 – 2020-03-27 (×2): 250 mg via ORAL
  Filled 2020-03-26 (×2): qty 1

## 2020-03-26 NOTE — Progress Notes (Signed)
[facility] [place] NAME: [name]    MR#:  [id number]  DATE OF BIRTH:  June 15, 1946  SUBJECTIVE:   Came in with increasing shortness of breath cough and drop in oxygen at home. Currently stable. Feels better than yesterday. Sats are 92% on room air. Still has some cough.  Lower extremity has been wrapped by RN with unna boot REVIEW OF SYSTEMS:   Review of Systems  Constitutional: Negative for chills, fever and weight loss.  HENT: Negative for ear discharge, ear pain and nosebleeds.   Eyes: Negative for blurred vision, pain and discharge.  Respiratory: Positive for cough and shortness of breath. Negative for sputum production, wheezing and stridor.   Cardiovascular: Positive for leg swelling. Negative for chest pain, palpitations, orthopnea and PND.  Gastrointestinal: Negative for abdominal pain, diarrhea, nausea and vomiting.  Genitourinary: Negative for frequency and urgency.  Musculoskeletal: Negative for back pain and joint pain.  Neurological: Positive for weakness. Negative for sensory change, speech change and focal weakness.  Psychiatric/Behavioral: Negative for depression and hallucinations. The patient is not nervous/anxious.    Tolerating Diet:yes Tolerating PT: outpatient PT  DRUG ALLERGIES:  No Known Allergies  VITALS:  Blood pressure 100/60, pulse 66, temperature 98.2 F (36.8 C), temperature source Oral, resp. rate 20, height 6' (1.829 m), weight 119 kg, SpO2 95 %.  PHYSICAL EXAMINATION:   Physical Exam  GENERAL:  74 y.o.-year-old patient lying in the bed with no acute distress.  EYES: Pupils equal, round, reactive to light and accommodation. No scleral icterus.   HEENT: Head atraumatic, normocephalic. Oropharynx and nasopharynx clear.  NECK:  Supple, no jugular venous distention. No thyroid enlargement, no tenderness.  LUNGS: normal breath sounds bilaterally, scatterd wheezing, no rales, rhonchi. No use of  accessory muscles of respiration.  CARDIOVASCULAR: S1, S2 normal. No murmurs, rubs, or gallops.  ABDOMEN: Soft, nontender, nondistended. Bowel sounds present. No organomegaly or mass.  EXTREMITIES: No cyanosis, clubbing or edema b/l. Left lower extremity chronic venous stasis changes--unna boot NEUROLOGIC: Cranial nerves II through XII are intact. No focal Motor or sensory deficits b/l.   PSYCHIATRIC:  patient is alert and oriented x 3.  SKIN: No obvious rash, lesion, or ulcer.   LABORATORY PANEL:  CBC Recent Labs  Lab 03/26/20 0527  WBC 11.8*  HGB 10.1*  HCT 33.4*  PLT 197    Chemistries  Recent Labs  Lab 03/25/20 0557 03/25/20 0557 03/26/20 0527  NA 139   < > 135  K 4.4   < > 4.0  CL 113*   < > 110  CO2 18*   < > 18*  GLUCOSE 316*   < > 274*  BUN 43*   < > 44*  CREATININE 1.60*   < > 1.67*  CALCIUM 8.8*   < > 8.7*  AST 13*  --   --   ALT 21  --   --   ALKPHOS 185*  --   --   BILITOT 0.7  --   --    < > = values in this interval not displayed.   Cardiac Enzymes No results for input(s): TROPONINI in the last 168 hours. RADIOLOGY:  US Venous Img Lower Unilateral Left (DVT)  Result Date: 03/25/2020 CLINICAL DATA:  Evaluate for left lower extremity venous thrombus. EXAM: LEFT LOWER EXTREMITY VENOUS DOPPLER ULTRASOUND TECHNIQUE: Gray-scale sonography with compression, as well as color and duplex ultrasound, were performed to evaluate the deep venous system(s) from  the level of the common femoral vein through the popliteal and proximal calf veins. COMPARISON:  01/10/2020 and 01/20/2017 FINDINGS: VENOUS Normal compressibility of the common femoral, superficial femoral, and popliteal veins. Visualized portions of profunda femoral vein and great saphenous vein unremarkable. No filling defects to suggest DVT on grayscale or color Doppler imaging. Doppler waveforms show normal direction of venous flow, normal respiratory phasicity and response to augmentation. Occlusive  noncompressible thrombus is present over the veins of the lower leg involving the posterior tibial and peroneal veins. This may be acute or chronic as patient had similar findings and February 2018. Limited views of the contralateral common femoral vein are unremarkable. OTHER None. Limitations: none IMPRESSION: Occlusive noncompressible thrombus evident in the left lower leg veins involving the posterior tibial and peroneal veins as this may be acute or chronic. Patient had similar findings in 2018. Left common femoral to popliteal veins are normal. Electronically Signed   By: Marin Olp M.D.   On: 03/25/2020 10:40   DG Chest Port 1 View  Result Date: 03/25/2020 CLINICAL DATA:  Initial evaluation for acute shortness of breath. EXAM: PORTABLE CHEST 1 VIEW COMPARISON:  Prior radiograph from 11/22/2019. FINDINGS: Cardiomegaly. Mediastinal silhouette within normal limits. Lungs hypoinflated. Parenchymal opacity seen within the mid and lower left lung, concerning for pneumonia. Underlying pulmonary interstitial congestion without overt pulmonary edema. No pleural effusion. No pneumothorax. No acute osseous finding. IMPRESSION: Patchy opacity within the mid and lower left lung, concerning for pneumonia. Electronically Signed   By: Jeannine Boga M.D.   On: 03/25/2020 06:43   ASSESSMENT AND PLAN:  [name] is a 74 y.o. male with medical history significant of hypertension, hyperlipidemia, diabetes mellitus, COPD, GERD, depression, OSA, renal cell cancer (s/p of nephrectomy), chronic back pain, DVT on Eliquis, CKD stage III, anemia, who presents with shortness of breath. Patient states that he has been having shortness of breath for almost 2 days, which has worsened since last night.  He has dry cough.  No chest pain, fever or chills.  Pt reports having O2 saturations of 70's and 88%  Acute on chronic respiratory failure with hypoxia due CAP (community acquired pneumonia): Patient had mild  leukocytosis with WBC 11.1.  No fever.  Lactic acid normal.  Clinically not septic.  - Chest x-ray with patchy infiltration in LML and LLL. - Pt is Eliquis for DVT, no chest pain.  Low suspicions for PE.  No history of CHF. - 2D echo on 02/09/2018 showed EF 60-85%.  BNP 31, less likely to have CHF. - Patient received 40 mg IV Lasix in ED.--euvolemic - IV Rocephin and azithromycin--change to oral abxs in am 85feels better today -stat >92% onRA - Mucinex for cough  - Bronchodilators - Follow up blood culture x2 negative so far  HTN:  -Continue home medications: Coreg, Lasix -hydralazine prn  Type II diabetes mellitus with renal manifestations ([place]), hyperglycemia: - Most recent A1c 8.1 poorly controled. Patient is taking glipizide at home--increase to  5 mg daily -SSI  Anemia in chronic kidney disease: Hgb stable, 11.1  H/O deep venous thrombosis-left leg: Pt has hx of left leg DVT.  He has been taking Eliquis for 6 years, but still has left leg swelling and some tenderness.   -continue Eliquis -LE venous dopplerOcclusive noncompressible thrombus evident in the left lower leg veins involving the posterior tibial and peroneal veins as this may be acute or chronic. Patient had similar findings in 2018. Left common femoral to popliteal veins  are normal -consult Dr. Lorenso Courier -- recommends unna boot for left LE chronic venous stasis and ulcers. Cont eliquis  Hyperlipidemia -lipitor  COPD (chronic obstructive pulmonary disease) [facility]): Patient is scattered rhonchi on auscultation.  Patient received 125 mg of Solu-Medrol in ED. -Bronchodilators as above -patient not wheezing. Sats are more than 98% on room air.  Depression:  -Cymbalta, pramipexole, Requip  CKD (chronic kidney disease), stage IIIa: Renal function close to baseline.  Baseline creatinine 1.3-1.5.  His creatinine is 1.60, BUN 43 -Follow-up renal function by BMP    Procedures: Family communication : wife in the  room Consults : vascular, wound Discharge Disposition : from home. Overall improving. Discharge to home tomorrow if remains stable. CODE STATUS: full DVT Prophylaxis : eliquis   TOTAL TIME TAKING CARE OF THIS PATIENT: *30 minutes.  >50% time spent on counselling and coordination of care  Note: This dictation was prepared with Dragon dictation along with smaller phrase technology. Any transcriptional errors that result from this process are unintentional.  Fritzi Mandes M.D    [facility]   CC: Primary care physician; Derinda Late, MDPatient ID: [name], male   DOB: [birth year], 74 y.o.   MRN: [id number]

## 2020-03-26 NOTE — Progress Notes (Signed)
[facility]  Daily Progress Note   Subjective  -   UNNA boot is controlling left leg swelling. No weeping today. No fever or chills.  No major issues overnight. Planning to have D/C tomorrow  Objective Vitals:   03/26/20 0904 03/26/20 1035 03/26/20 1548 03/26/20 1610  BP: (!) 127/57 (!) 107/94  100/60  Pulse: 87 74  66  Resp:      Temp: 98.1 F (36.7 C) 98.2 F (36.8 C)    TempSrc: Oral Oral    SpO2: 96% 97% 97% 95%  Weight:      Height:        Intake/Output Summary (Last 24 hours) at 03/26/2020 1750 Last data filed at 03/26/2020 1734 Gross per 24 hour  Intake 350 ml  Output 1600 ml  Net -1250 ml    PULM  CTAB CV  RRR VASC  Left leg with 1-2 + edema, UNNA boot is in place.  Laboratory CBC    Component Value Date/Time   WBC 11.8 (H) 03/26/2020 0527   HGB 10.1 (L) 03/26/2020 0527   HGB 12.5 (L) 01/10/2015 1626   HCT 33.4 (L) 03/26/2020 0527   HCT 38.2 (L) 01/10/2015 1626   PLT 197 03/26/2020 0527   PLT 200 01/10/2015 1626    BMET    Component Value Date/Time   NA 135 03/26/2020 0527   NA 142 01/10/2015 1626   K 4.0 03/26/2020 0527   K 4.3 01/10/2015 1626   CL 110 03/26/2020 0527   CL 110 (H) 01/10/2015 1626   CO2 18 (L) 03/26/2020 0527   CO2 25 01/10/2015 1626   GLUCOSE 274 (H) 03/26/2020 0527   GLUCOSE 144 (H) 01/10/2015 1626   BUN 44 (H) 03/26/2020 0527   BUN 24 (H) 01/10/2015 1626   CREATININE 1.67 (H) 03/26/2020 0527   CREATININE 1.13 01/10/2015 1626   CALCIUM 8.7 (L) 03/26/2020 0527   CALCIUM 9.2 01/10/2015 1626   GFRNONAA 41 (L) 03/26/2020 0527   GFRNONAA >60 01/10/2015 1626   GFRNONAA >60 06/24/2014 0538   GFRAA 47 (L) 03/26/2020 0527   GFRAA >60 01/10/2015 1626   GFRAA >60 06/24/2014 0538    Assessment/Planning:    Left leg swelling/weeping  UNNA Boot  Come to the office next Monday or Tuesday for UNNA boot change or removal  Will also have a Rx in the office for the Juxtalite compression velcro system to  try that instead of stockings which don't seem to fit well and are difficult to get on an off  No other in  House recs from vascular POV    Leotis Pain  03/26/2020, 5:50 PM

## 2020-03-26 NOTE — Progress Notes (Signed)
I have reviewed the charting completed by the Student-RN and I agree with his assessments.  Etsuko Dierolf A Darnell, RN 

## 2020-03-26 NOTE — Consult Note (Signed)
WOC Nurse Consult Note: Reason for Consult:place Unna's boot LLE Wound type: venous stasis; 2 small lesions Pressure Injury POA: NA Measurement: aprox 1cm x 1cm x 0.1cm  Wound bed: yellow Drainage (amount, consistency, odor) none assessed. No dressing in place at the time of my assessment Periwound: edematous, hemosiderin staining Dressing procedure/placement/frequency: Unna's boot placed to the LLE.  Patient to follow up with vascular MD as ordered will need Unna's boot change next Monday.  Patient reports he was sent to the wound care center previously.  Will keep on FU list for Unna's boot change Q Monday's in case he remains inpatient.   [place], [place], [place]

## 2020-03-26 NOTE — Evaluation (Signed)
Physical Therapy Evaluation Patient Details Name: [name] MRN: [id number] DOB: 1946-04-06 Today's Date: 03/26/2020   History of Present Illness  Pt is a 74 y.o. male presenting to hospital 4/11 with increasing SOB last 1-2 days.  Pt admitted with acute on chronic respiratory failure with hypoxia d/t community acquired PNA.  PMH includes h/o L LE DVT, chronic back pain, COPD, depression, DM, htn, CTR, gastric bypass, h/o ORIF R distal radius, peripheral edema, renal cell carcinoma s/p nephrectomy.  Clinical Impression  Prior to hospital admission, pt was independent (using cane at times for balance) and has been going to OP PT.  Currently pt is independent with transfers and SBA to CGA with ambulation 190 feet (no AD); mild loss of balance towards end of ambulation but pt able to self correct (pt reports he has been using a cane d/t this).  HR and O2 sats on room air stable and WFL throughout treatment session.  Pt would benefit from skilled PT to address noted impairments and functional limitations (see below for any additional details).  Upon hospital discharge, pt would benefit from continuing OP PT.    Follow Up Recommendations Outpatient PT(continue OP PT for back pain and neck issues)    Equipment Recommendations  Cane(pt reports he lost his cane recently but will be able to get one on his own)    Recommendations for Other Services       Precautions / Restrictions Precautions Precautions: None Restrictions Weight Bearing Restrictions: No      Mobility  Bed Mobility               General bed mobility comments: Deferred (pt sitting in chair beginning/end of session)  Transfers Overall transfer level: Independent Equipment used: None             General transfer comment: steady safe transfers noted  Ambulation/Gait Ambulation/Gait assistance: Supervision;Min guard Gait Distance (Feet): 190 Feet Assistive device: None Gait Pattern/deviations:  Step-through pattern Gait velocity: mildly decreased   General Gait Details: decreased stance time L LE; mild loss of balance towards end of ambulation but pt able to self correct (pt reports he has been using a cane d/t this)  Stairs            Wheelchair Mobility    Modified Rankin (Stroke Patients Only)       Balance Overall balance assessment: Needs assistance Sitting-balance support: No upper extremity supported;Feet supported Sitting balance-Leahy Scale: Normal Sitting balance - Comments: steady sitting reaching outside BOS   Standing balance support: No upper extremity supported;During functional activity Standing balance-Leahy Scale: Good Standing balance comment: mild loss of balance with ambulation but pt able to self correct                             Pertinent Vitals/Pain      Home Living Family/patient expects to be discharged to:: Private residence Living Arrangements: Spouse/significant other Available Help at Discharge: Family Type of Home: House Home Access: Ramped entrance;Stairs to enter(4 STE from back)   Entrance Stairs-Number of Steps: ramp to enter from garage Home Layout: One level Home Equipment: None      Prior Function Level of Independence: Independent               Hand Dominance        Extremity/Trunk Assessment   Upper Extremity Assessment Upper Extremity Assessment: Defer to OT evaluation    Lower Extremity Assessment  Lower Extremity Assessment: Generalized weakness(L LE at least 3/5 AROM (appearing weaker than R LE d/t increased L LE swelling compared to R))    Cervical / Trunk Assessment Cervical / Trunk Assessment: Normal  Communication   Communication: No difficulties  Cognition Arousal/Alertness: Awake/alert Behavior During Therapy: WFL for tasks assessed/performed Overall Cognitive Status: Within Functional Limits for tasks assessed                                         General Comments General comments (skin integrity, edema, etc.): increased L LE swelling noted.  Nursing cleared pt for participation in physical therapy.  Pt agreeable to PT session.    Exercises     Assessment/Plan    PT Assessment Patient needs continued PT services  PT Problem List Decreased balance;Decreased mobility;Decreased strength;Pain       PT Treatment Interventions DME instruction;Gait training;Functional mobility training;Therapeutic activities;Therapeutic exercise;Balance training;Patient/family education    PT Goals (Current goals can be found in the Care Plan section)  Acute Rehab PT Goals Patient Stated Goal: To regain independence PT Goal Formulation: With patient Time For Goal Achievement: 04/09/20 Potential to Achieve Goals: Good    Frequency Min 2X/week   Barriers to discharge        Co-evaluation               AM-PAC PT "6 Clicks" Mobility  Outcome Measure Help needed turning from your back to your side while in a flat bed without using bedrails?: None Help needed moving from lying on your back to sitting on the side of a flat bed without using bedrails?: None Help needed moving to and from a bed to a chair (including a wheelchair)?: None Help needed standing up from a chair using your arms (e.g., wheelchair or bedside chair)?: None Help needed to walk in hospital room?: A Little Help needed climbing 3-5 steps with a railing? : A Little 6 Click Score: 22    End of Session Equipment Utilized During Treatment: Gait belt Activity Tolerance: Patient tolerated treatment well Patient left: in chair;with call bell/phone within reach Nurse Communication: Mobility status;Precautions PT Visit Diagnosis: Other abnormalities of gait and mobility (R26.89);Difficulty in walking, not elsewhere classified (R26.2)    Time: 1010-1028 PT Time Calculation (min) (ACUTE ONLY): 18 min   Charges:   PT Evaluation $PT Eval Low Complexity: 1 Low          Selina Tapper, PT 03/26/20, 3:17 PM

## 2020-03-26 NOTE — Progress Notes (Signed)
Inpatient Diabetes Program Recommendations  AACE/ADA: New Consensus Statement on Inpatient Glycemic Control   Target Ranges:  Prepandial:   less than 140 mg/dL      Peak postprandial:   less than 180 mg/dL (1-2 hours)      Critically ill patients:  140 - 180 mg/dL   Results for [name], [name] (MRN [id number]) as of 03/26/2020 11:03  Ref. Range 03/25/2020 08:22 03/25/2020 13:25 03/25/2020 16:10 03/25/2020 21:03 03/26/2020 08:34  Glucose-Capillary Latest Ref Range: 70 - 99 mg/dL 269 (H) 384 (H) 395 (H) 384 (H) 274 (H)   Review of Glycemic Control  Diabetes history: DM2 Outpatient Diabetes medications: Glipizide XL 2.5 mg daily Current orders for Inpatient glycemic control: Novolog 0-9 units TID with meals, Novolog 0-5 units QHS  Inpatient Diabetes Program Recommendations:   Insulin - Basal: Please consider ordering Lantus 5 units daily.  Insulin - Meal Coverage: Please consider ordering Novolog 3 units TID wtih meals for meal coverage if patient eats at least 50% of meals.  NOTE: In reviewing chart, noted patient received Solumedrol 125 mg a 7:34 am on 03/25/20 which is contributing to hyperglycemia.   Thanks, Barnie Alderman, RN, MSN, CDE Diabetes Coordinator Inpatient Diabetes Program [phone] (Team Pager from 8am to 5pm)

## 2020-03-26 NOTE — Evaluation (Signed)
Occupational Therapy Evaluation Patient Details Name: [name] MRN: [id number] DOB: 1946-06-30 Today's Date: 03/26/2020    History of Present Illness Pt. is a 74 y.o. male who was admitted to [facility] with CAP. PMHx includes: HTN, hyperlipidemia, DM, COPD, GERD< Depression, OSA, Renal CA, Chronic Back pain, DVT CKD stage III, anemia, Restless Leg Syndrome, Right CTS with repair, and LUE nerve changes,   Clinical Impression   Pt. Presents with weakness, limited activity tolerance, history of back pain which hinders his ability to complete basic ADL and IADL functioning. Pt. Presents with limited [facility] skills which makes it difficult to manipulate buttons. Pt. Resides at home with his wife. Pt. Was independent with ADLs, and IADL functioning: including yard work. meal preparation, and medication management. Pt. Was able to drive. Pt. Education was provided about A/E use for LE ADLs, and energy conservation, work simplification techniques. Pt. was provided with a visual handout. Pt. will benefit from OT services for ADL training, additional A/E training, and pt. Education about home modification, and DME. Pt. Plans to return home upon discharge, with family assistance as needed. Pt. Reports having access to a reacher, and DME at home.    Follow Up Recommendations  No OT follow up    Equipment Recommendations  None recommended by OT    Recommendations for Other Services       Precautions / Restrictions Precautions Precautions: None      Mobility Bed Mobility    Independent              Transfers    Pt. Up  Sitting at EOB for treatment session                  Balance                                           ADL either performed or assessed with clinical judgement   ADL Overall ADL's : Needs assistance/impaired Eating/Feeding: Independent;Set up   Grooming: Set up;Independent           Upper Body Dressing : Minimal assistance Upper  Body Dressing Details (indicate cue type and reason): Assist with buttoning Lower Body Dressing: Moderate assistance                 General ADL Comments: Pt. edcuation was provided about A/E use for LE ADLs, and energy conservation.     Vision Baseline Vision/History: Wears glasses Wears Glasses: Reading only Patient Visual Report: No change from baseline       Perception     Praxis      Pertinent Vitals/Pain Pain Assessment: 0-10 Pain Score: 0-No pain Pain Location: History of back pain     Hand Dominance Left   Extremity/Trunk Assessment Upper Extremity Assessment Upper Extremity Assessment: Generalized weakness(limiteed [facility] skills.)           Communication Communication Communication: No difficulties   Cognition Arousal/Alertness: Awake/alert Behavior During Therapy: WFL for tasks assessed/performed Overall Cognitive Status: Within Functional Limits for tasks assessed                                     General Comments       Exercises     Shoulder Instructions      Home Living Family/patient expects to be  discharged to:: Private residence   Available Help at Discharge: Family Type of Home: House Home Access: Ramped entrance;Stairs to enter(4 steps in back)     Home Layout: One level               Home Equipment: None          Prior Functioning/Environment Level of Independence: Independent                 OT Problem List: Decreased strength;Decreased knowledge of use of DME or AE;Cardiopulmonary status limiting activity;Impaired UE functional use      OT Treatment/Interventions: Self-care/ADL training;Therapeutic exercise;Patient/family education;DME and/or AE instruction;Energy conservation    OT Goals(Current goals can be found in the care plan section) Acute Rehab OT Goals Patient Stated Goal: To regain independence OT Goal Formulation: With patient Time For Goal Achievement: 04/13/20 Potential to  Achieve Goals: Good  OT Frequency: Min 2X/week   Barriers to D/C:            Co-evaluation              AM-PAC OT "6 Clicks" Daily Activity     Outcome Measure Help from another person eating meals?: None Help from another person taking care of personal grooming?: None Help from another person toileting, which includes using toliet, bedpan, or urinal?: None Help from another person bathing (including washing, rinsing, drying)?: A Little Help from another person to put on and taking off regular upper body clothing?: A Little Help from another person to put on and taking off regular lower body clothing?: A Lot 6 Click Score: 20   End of Session Equipment Utilized During Treatment: Gait belt  Activity Tolerance: Patient tolerated treatment well Patient left: in bed  OT Visit Diagnosis: Muscle weakness (generalized) (M62.81)                Time: 5885-0277 OT Time Calculation (min): 23 min Charges:  OT General Charges $OT Visit: 1 Visit OT Evaluation $OT Eval Moderate Complexity: 1 Mod  Harrel Carina, MS, OTR/L   Harrel Carina 03/26/2020, 9:52 AM

## 2020-03-27 DIAGNOSIS — J189 Pneumonia, unspecified organism: Secondary | ICD-10-CM | POA: Diagnosis not present

## 2020-03-27 LAB — GLUCOSE, CAPILLARY
Glucose-Capillary: 283 mg/dL — ABNORMAL HIGH (ref 70–99)
Glucose-Capillary: 235 mg/dL — ABNORMAL HIGH (ref 70–99)

## 2020-03-27 LAB — LEGIONELLA PNEUMOPHILA SEROGP 1 UR AG: L. pneumophila Serogp 1 Ur Ag: NEGATIVE

## 2020-03-27 MED ORDER — LEVOFLOXACIN 750 MG PO TABS: 750.0000 mg | ORAL_TABLET | Freq: Every day | ORAL | 0 refills | Status: DC

## 2020-03-27 MED ORDER — GLIPIZIDE ER 2.5 MG PO TB24
2.5000 mg | ORAL_TABLET | Freq: Once | ORAL | Status: AC
  Administered 2020-03-27: 2.5 mg via ORAL
  Filled 2020-03-27: qty 1

## 2020-03-27 MED ORDER — IPRATROPIUM BROMIDE 0.02 % IN SOLN: 0.5000 mg | Freq: Three times a day (TID) | RESPIRATORY_TRACT | Status: DC

## 2020-03-27 MED ORDER — ROPINIROLE HCL 1 MG PO TABS: 2.0000 mg | ORAL_TABLET | Freq: Two times a day (BID) | ORAL | 1 refills | Status: DC

## 2020-03-27 MED ORDER — GLIPIZIDE ER 2.5 MG PO TB24: 7.5000 mg | ORAL_TABLET | Freq: Every day | ORAL | 1 refills | Status: DC

## 2020-03-27 MED ORDER — GLIPIZIDE ER 5 MG PO TB24
7.5000 mg | ORAL_TABLET | Freq: Every day | ORAL | Status: DC
  Filled 2020-03-27: qty 1

## 2020-03-27 MED ORDER — LEVOFLOXACIN 750 MG PO TABS
750.0000 mg | ORAL_TABLET | Freq: Every day | ORAL | Status: DC
  Administered 2020-03-27: 11:00:00 750 mg via ORAL
  Filled 2020-03-27: qty 1

## 2020-03-27 NOTE — Discharge Instructions (Signed)
Keep log of your sugars and review with PCP

## 2020-03-27 NOTE — TOC Transition Note (Signed)
Transition of Care [facility]) - CM/SW Discharge Note   Patient Details  Name: [name] MRN: [id number] Date of Birth: 08-Jul-1946  Transition of Care [facility]) CM/SW Contact:  Victorino Dike, RN Phone Number: 03/27/2020, 9:53 AM   Clinical Narrative:      Referral sent to [place]. No further TOC needs at this time, please re-consult for new needs.    Final next level of care: OP Rehab Barriers to Discharge: Barriers Resolved   Patient Goals and CMS Choice        Discharge Placement                       Discharge Plan and Services                                     Social Determinants of Health (SDOH) Interventions     Readmission Risk Interventions Readmission Risk Prevention Plan 07/08/2019  Transportation Screening Complete  PCP or Specialist Appt within 3-5 Days Complete  HRI or [place] Complete  Social Work Consult for [place] Planning/Counseling Complete  Palliative Care Screening Not Applicable  Medication Review [employer]) Complete  Some recent data might be hidden

## 2020-03-27 NOTE — Discharge Summary (Signed)
[place] [facility] [place] NAME: [name]    MR#:  [id number]  DATE OF BIRTH:  15-May-1946  DATE OF ADMISSION:  03/25/2020 ADMITTING PHYSICIAN: Ivor Costa, MD  DATE OF DISCHARGE: 03/27/2020  PRIMARY CARE PHYSICIAN: Derinda Late, MD    ADMISSION DIAGNOSIS:  Shortness of breath [R06.02] Hyperglycemia [R73.9] DVT (deep venous thrombosis) ([place]) [I82.409] CAP (community acquired pneumonia) [J18.9] Community acquired pneumonia of left lung, unspecified part of lung [J18.9]  DISCHARGE DIAGNOSIS:  CAP left middle lobe Uncontrolled Dm-2  SECONDARY DIAGNOSIS:   Past Medical History:  Diagnosis Date  . Chronic back pain   . COPD (chronic obstructive pulmonary disease) ([place])   . Depression   . Diabetes mellitus without complication ([place])   . Hyperlipidemia   . Hypertension   . Renal cell carcinoma ([place])   . Sleep apnea     HOSPITAL COURSE:  Curtis Morimoto Campbellis a 74 y.o.malewith medical history significant ofhypertension, hyperlipidemia, diabetes mellitus, COPD, GERD, depression, OSA, renal cell cancer (s/p ofnephrectomy), chronic back pain, DVT on Eliquis, CKD stage III, anemia, who presents with shortness of breath. Patient states that he has been having shortness of breath for almost 2 days, which has worsened since last night. He has dry cough. Pt reports having O2 saturations of 70's and 88%  Acute on chronic respiratory failure with hypoxiadueCAP (community acquired pneumonia):Patient had mild leukocytosis with WBC 11.1. No fever. Lactic acid normal. Clinically not septic.  -Chest x-ray with patchy infiltration in LML and LLL. -2D echo on 02/09/2018 showed EF 60-85%. BNP 31, less likely to have CHF. -Patient received 40 mg IV Lasix in ED.--euvolemic - IV Rocephin and azithromycin--change to oral levaquin  -feels better today -stat >92% onRA - Mucinex for cough -Bronchodilators - Follow up blood culture x2  negative so far  HTN:  -Continue home medications:Coreg, Lasix -hydralazine prn  Type II diabetes mellitus with renal manifestations ([place]), hyperglycemia: -Most recent A1c8.1 poorly controled. Patient is takingglipizideat home--increase to  7.5 mg daily (pt was on 2.5 mg) -recommended to cont sugar checks and d/w PCP and or get referralt o see endocrinology -SSI  Anemia in chronic kidney disease:Hgb stable, 11.1  H/O deep venous thrombosis-left leg:Pt hashx of left leg DVT.He has been taking Eliquis for 6 years, but still has left leg swelling and some tenderness.  -continue Eliquis -LE venous dopplerOcclusive noncompressible thrombus evident in the left lower leg veins involving the posterior tibial and peroneal veins as this may be acute or chronic. Patient had similar findings in 2018. Left common femoral to popliteal veins are normal -consult Dr. 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 Cont eliquis -f/u next week to change/remove unna boot  Hyperlipidemia -lipitor  COPD (chronic obstructive pulmonary disease) (HCC):Patient is scattered rhonchi on auscultation. Patient received 125 mg of Solu-Medrol in ED. -Bronchodilators as above -patient not wheezing. Sats are more than 98% on room air.  Depression: -Cymbalta, pramipexole, Requip  CKD (chronic kidney disease), stage IIIa:Renal function close to baseline. Baseline creatinine 1.3-1.5.His creatinine is 1.60, BUN 43 -Follow-up renal function by BMP  RLS cont requip 2 mg bid(works better for pt)   Procedures: Family communication : wife in the room Consults : vascular, wound Discharge Disposition : from home. Overall improving. Discharge to home today CODE STATUS: full DVT Prophylaxis : eliquis  CONSULTS OBTAINED:    DRUG ALLERGIES:  No Known Allergies  DISCHARGE MEDICATIONS:   Allergies as of 03/27/2020  No Known Allergies     Medication  List    TAKE these medications   atorvastatin 10 MG tablet Commonly known as: LIPITOR Take 10 mg by mouth daily.   carvedilol 6.25 MG tablet Commonly known as: COREG Take 6.25 mg by mouth 2 (two) times daily with a meal.   DULoxetine 60 MG capsule Commonly known as: CYMBALTA 120 mg daily.   Eliquis 5 MG Tabs tablet Generic drug: apixaban Take 5 mg by mouth every 12 (twelve) hours.   furosemide 40 MG tablet Commonly known as: LASIX Take 40 mg by mouth 2 (two) times daily.   glipiZIDE 2.5 MG 24 hr tablet Commonly known as: GLUCOTROL XL Take 3 tablets (7.5 mg total) by mouth daily with breakfast. Start taking on: March 28, 2020 What changed: how much to take   levofloxacin 750 MG tablet Commonly known as: LEVAQUIN Take 1 tablet (750 mg total) by mouth daily.   multivitamin tablet Take 1 tablet by mouth daily.   oxyCODONE-acetaminophen 10-325 MG tablet Commonly known as: Percocet Take 1 tablet by mouth every 8 (eight) hours as needed for pain. Must last 30 days.   pantoprazole 40 MG tablet Commonly known as: PROTONIX Take 40 mg by mouth 2 (two) times daily.   potassium chloride 20 MEQ packet Commonly known as: KLOR-CON Take 20 mEq by mouth daily.   rOPINIRole 1 MG tablet Commonly known as: REQUIP Take 2 tablets (2 mg total) by mouth 2 (two) times daily. What changed: when to take this   sucralfate 1 g tablet Commonly known as: CARAFATE Take 1 g by mouth 3 (three) times daily.   tiZANidine 2 MG tablet Commonly known as: ZANAFLEX Take 2 mg by mouth every 8 (eight) hours as needed for muscle spasms.   triamcinolone cream 0.1 % Commonly known as: KENALOG Apply 1 application topically 2 (two) times daily.       If you experience worsening of your admission symptoms, develop shortness of breath, life threatening emergency, suicidal or homicidal thoughts you must seek medical attention immediately by calling 911 or calling your MD immediately  if symptoms less  severe.  You Must read complete instructions/literature along with all the possible adverse reactions/side effects for all the Medicines you take and that have been prescribed to you. Take any new Medicines after you have completely understood and accept all the possible adverse reactions/side effects.   Please note  You were cared for by a hospitalist during your hospital stay. If you have any questions about your discharge medications or the care you received while you were in the hospital after you are discharged, you can call the unit and asked to speak with the hospitalist on call if the hospitalist that took care of you is not available. Once you are discharged, your primary care physician will handle any further medical issues. Please note that NO REFILLS for any discharge medications will be authorized once you are discharged, as it is imperative that you return to your primary care physician (or establish a relationship with a primary care physician if you do not have one) for your aftercare needs so that they can reassess your need for medications and monitor your lab values. Today   SUBJECTIVE   Doing well  VITAL SIGNS:  Blood pressure 125/74, pulse 79, temperature 97.9 F (36.6 C), temperature source Oral, resp. rate 17, height 6' (1.829 m), weight 119.5 kg, SpO2 97 %.  I/O:    Intake/Output Summary (Last 24 hours) at  03/27/2020 0926 Last data filed at 03/27/2020 0547 Gross per 24 hour  Intake 350 ml  Output 500 ml  Net -150 ml    PHYSICAL EXAMINATION:  GENERAL:  74 y.o.-year-old patient lying in the bed with no acute distress.  EYES: Pupils equal, round, reactive to light and accommodation. No scleral icterus.  HEENT: Head atraumatic, normocephalic. Oropharynx and nasopharynx clear.  NECK:  Supple, no jugular venous distention. No thyroid enlargement, no tenderness.  LUNGS: Normal breath sounds bilaterally, no wheezing, rales,rhonchi or crepitation. No use of accessory  muscles of respiration.  CARDIOVASCULAR: S1, S2 normal. No murmurs, rubs, or gallops.  ABDOMEN: Soft, non-tender, non-distended. Bowel sounds present. No organomegaly or mass.  EXTREMITIES: left LE unna boot NEUROLOGIC: Cranial nerves II through XII are intact. Muscle strength 5/5 in all extremities. Sensation intact. Gait not checked.  PSYCHIATRIC: The patient is alert and oriented x 3.  SKIN: No obvious rash, lesion, or ulcer.   DATA REVIEW:   CBC  Recent Labs  Lab 03/26/20 0527  WBC 11.8*  HGB 10.1*  HCT 33.4*  PLT 197    Chemistries  Recent Labs  Lab 03/25/20 0557 03/25/20 0557 03/26/20 0527  NA 139   < > 135  K 4.4   < > 4.0  CL 113*   < > 110  CO2 18*   < > 18*  GLUCOSE 316*   < > 274*  BUN 43*   < > 44*  CREATININE 1.60*   < > 1.67*  CALCIUM 8.8*   < > 8.7*  AST 13*  --   --   ALT 21  --   --   ALKPHOS 185*  --   --   BILITOT 0.7  --   --    < > = values in this interval not displayed.    Microbiology Results   Recent Results (from the past 240 hour(s))  Respiratory Panel by RT PCR (Flu A&B, Covid) - Nasopharyngeal Swab     Status: None   Collection Time: 03/25/20  5:57 AM   Specimen: Nasopharyngeal Swab  Result Value Ref Range Status   SARS Coronavirus 2 by RT PCR NEGATIVE NEGATIVE Final    Comment: (NOTE) SARS-CoV-2 target nucleic acids are NOT DETECTED. The SARS-CoV-2 RNA is generally detectable in upper respiratoy specimens during the acute phase of infection. The lowest concentration of SARS-CoV-2 viral copies this assay can detect is 131 copies/mL. A negative result does not preclude SARS-Cov-2 infection and should not be used as the sole basis for treatment or other patient management decisions. A negative result may occur with  improper specimen collection/handling, submission of specimen other than nasopharyngeal swab, presence of viral mutation(s) within the areas targeted by this assay, and inadequate number of viral copies (<131  copies/mL). A negative result must be combined with clinical observations, patient history, and epidemiological information. The expected result is Negative. Fact Sheet for Patients:  PinkCheek.be Fact Sheet for Healthcare Providers:  GravelBags.it This test is not yet ap proved or cleared by the [place] FDA and  has been authorized for detection and/or diagnosis of SARS-CoV-2 by FDA under an Emergency Use Authorization (EUA). This EUA will remain  in effect (meaning this test can be used) for the duration of the COVID-19 declaration under Section 564(b)(1) of the Act, 21 U.S.C. section 360bbb-3(b)(1), unless the authorization is terminated or revoked sooner.    Influenza A by PCR NEGATIVE NEGATIVE Final   Influenza B by PCR NEGATIVE NEGATIVE  Final    Comment: (NOTE) The Xpert Xpress SARS-CoV-2/FLU/RSV assay is intended as an aid in  the diagnosis of influenza from Nasopharyngeal swab specimens and  should not be used as a sole basis for treatment. Nasal washings and  aspirates are unacceptable for Xpert Xpress SARS-CoV-2/FLU/RSV  testing. Fact Sheet for Patients: PinkCheek.be Fact Sheet for Healthcare Providers: GravelBags.it This test is not yet approved or cleared by the [place] FDA and  has been authorized for detection and/or diagnosis of SARS-CoV-2 by  FDA under an Emergency Use Authorization (EUA). This EUA will remain  in effect (meaning this test can be used) for the duration of the  Covid-19 declaration under Section 564(b)(1) of the Act, 21  U.S.C. section 360bbb-3(b)(1), unless the authorization is  terminated or revoked. Performed at [facility], [address] [place], [place] [address]   Culture, blood (routine x 2)     Status: None (Preliminary result)   Collection Time: 03/25/20  7:23 AM   Specimen: BLOOD  Result Value Ref  Range Status   Specimen Description BLOOD RIGHT ANTECUBITAL  Final   Special Requests   Final    BOTTLES DRAWN AEROBIC AND ANAEROBIC Blood Culture results may not be optimal due to an inadequate volume of blood received in culture bottles   Culture   Final    NO GROWTH 2 DAYS Performed at [facility], [address], [place], [place] [address]    Report Status PENDING  Incomplete  Culture, blood (routine x 2)     Status: None (Preliminary result)   Collection Time: 03/25/20  7:23 AM   Specimen: BLOOD  Result Value Ref Range Status   Specimen Description BLOOD LEFT ANTECUBITAL  Final   Special Requests   Final    BOTTLES DRAWN AEROBIC AND ANAEROBIC Blood Culture results may not be optimal due to an inadequate volume of blood received in culture bottles   Culture   Final    NO GROWTH 2 DAYS Performed at [facility], [address] [place], [place] [address]    Report Status PENDING  Incomplete    RADIOLOGY:  US Venous Img Lower Unilateral Left (DVT)  Result Date: 03/25/2020 CLINICAL DATA:  Evaluate for left lower extremity venous thrombus. EXAM: LEFT LOWER EXTREMITY VENOUS DOPPLER ULTRASOUND TECHNIQUE: Gray-scale sonography with compression, as well as color and duplex ultrasound, were performed to evaluate the deep venous system(s) from the level of the common femoral vein through the popliteal and proximal calf veins. COMPARISON:  01/10/2020 and 01/20/2017 FINDINGS: VENOUS Normal compressibility of the common femoral, superficial femoral, and popliteal veins. Visualized portions of profunda femoral vein and great saphenous vein unremarkable. No filling defects to suggest DVT on grayscale or color Doppler imaging. Doppler waveforms show normal direction of venous flow, normal respiratory phasicity and response to augmentation. Occlusive noncompressible thrombus is present over the veins of the lower leg involving the posterior tibial and peroneal veins. This may be  acute or chronic as patient had similar findings and February 2018. Limited views of the contralateral common femoral vein are unremarkable. OTHER None. Limitations: none IMPRESSION: Occlusive noncompressible thrombus evident in the left lower leg veins involving the posterior tibial and peroneal veins as this may be acute or chronic. Patient had similar findings in 2018. Left common femoral to popliteal veins are normal. Electronically Signed   By: Marin Olp M.D.   On: 03/25/2020 10:40     CODE STATUS:     Code Status  Orders  (From admission, onward)         Start     Ordered   03/25/20 1327  Full code  Continuous     03/25/20 1326        Code Status History    Date Active Date Inactive Code Status Order ID Comments User Context   01/10/2020 1501 01/13/2020 1859 Full Code [id number]  Sidney Ace, MD ED   07/05/2019 1256 07/08/2019 1728 Full Code [id number]  Lang Snow, NP ED   Advance Care Planning Activity       TOTAL TIME TAKING CARE OF THIS PATIENT: *40* minutes.    Fritzi Mandes M.D  [facility]    CC: Primary care physician; Derinda Late, MD

## 2020-03-27 NOTE — Progress Notes (Signed)
Discharge instructions explained, pt verbalized understanding/ IV and tele removed. Will transport off unit via wheelchair when ride arrives.

## 2020-03-30 LAB — CULTURE, BLOOD (ROUTINE X 2)
Culture: NO GROWTH
Culture: NO GROWTH

## 2020-04-02 ENCOUNTER — Ambulatory Visit (INDEPENDENT_AMBULATORY_CARE_PROVIDER_SITE_OTHER): Payer: Medicare Other | Admitting: Nurse Practitioner

## 2020-04-02 ENCOUNTER — Other Ambulatory Visit: Payer: Self-pay

## 2020-04-02 ENCOUNTER — Encounter (INDEPENDENT_AMBULATORY_CARE_PROVIDER_SITE_OTHER): Payer: Self-pay

## 2020-04-02 VITALS — BP 127/67 | HR 84 | Resp 16 | Wt 266.0 lb

## 2020-04-02 DIAGNOSIS — I890 Lymphedema, not elsewhere classified: Secondary | ICD-10-CM | POA: Diagnosis not present

## 2020-04-02 NOTE — Progress Notes (Signed)
History of Present Illness  There is no documented history at this time  Assessments & Plan   There are no diagnoses linked to this encounter.    Additional instructions  Subjective:  Patient presents with venous ulcer of the Left lower extremity.    Procedure:  3 layer unna wrap was placed Left lower extremity.   Plan:   Follow up in one week.  

## 2020-04-05 ENCOUNTER — Telehealth: Payer: Self-pay | Admitting: *Deleted

## 2020-04-05 NOTE — Telephone Encounter (Signed)
Attempted to call for pre appointment review of allergies/meds. Message left. 

## 2020-04-05 NOTE — Telephone Encounter (Signed)
Incoming call to triage. Wife left vm requesting a call back to discuss medication list. I attempted to reach patient's wife. Phone line busy and unable to return phone call. Sarah, please contact patient's wife back in am

## 2020-04-06 NOTE — Telephone Encounter (Signed)
Spoke with patient's wife, she thought that we called to go over his medication list; looking at his appts, it was likely the pain clinic who called. Wife stated that she had talked with them last night. No further questions or concerns.

## 2020-04-09 ENCOUNTER — Other Ambulatory Visit: Payer: Self-pay

## 2020-04-09 ENCOUNTER — Encounter (INDEPENDENT_AMBULATORY_CARE_PROVIDER_SITE_OTHER): Payer: Self-pay

## 2020-04-09 ENCOUNTER — Ambulatory Visit
Payer: Medicare Other | Attending: Student in an Organized Health Care Education/Training Program | Admitting: Student in an Organized Health Care Education/Training Program

## 2020-04-09 ENCOUNTER — Encounter: Payer: Self-pay | Admitting: Student in an Organized Health Care Education/Training Program

## 2020-04-09 ENCOUNTER — Ambulatory Visit (INDEPENDENT_AMBULATORY_CARE_PROVIDER_SITE_OTHER): Payer: Medicare Other | Admitting: Nurse Practitioner

## 2020-04-09 VITALS — BP 140/73 | HR 87 | Resp 16 | Wt 261.4 lb

## 2020-04-09 DIAGNOSIS — M5136 Other intervertebral disc degeneration, lumbar region: Secondary | ICD-10-CM | POA: Diagnosis not present

## 2020-04-09 DIAGNOSIS — M48061 Spinal stenosis, lumbar region without neurogenic claudication: Secondary | ICD-10-CM

## 2020-04-09 DIAGNOSIS — M9973 Connective tissue and disc stenosis of intervertebral foramina of lumbar region: Secondary | ICD-10-CM

## 2020-04-09 DIAGNOSIS — I890 Lymphedema, not elsewhere classified: Secondary | ICD-10-CM

## 2020-04-09 DIAGNOSIS — M4326 Fusion of spine, lumbar region: Secondary | ICD-10-CM | POA: Diagnosis not present

## 2020-04-09 DIAGNOSIS — M5416 Radiculopathy, lumbar region: Secondary | ICD-10-CM | POA: Diagnosis not present

## 2020-04-09 DIAGNOSIS — G894 Chronic pain syndrome: Secondary | ICD-10-CM

## 2020-04-09 DIAGNOSIS — M48062 Spinal stenosis, lumbar region with neurogenic claudication: Secondary | ICD-10-CM

## 2020-04-09 MED ORDER — OXYCODONE-ACETAMINOPHEN 10-325 MG PO TABS: 1.0000 | ORAL_TABLET | Freq: Three times a day (TID) | ORAL | 0 refills | Status: DC | PRN

## 2020-04-09 NOTE — Progress Notes (Signed)
History of Present Illness  There is no documented history at this time  Assessments & Plan   There are no diagnoses linked to this encounter.    Additional instructions  Subjective:  Patient presents with venous ulcer of the Left lower extremity.    Procedure:  3 layer unna wrap was placed Left lower extremity.   Plan:   Follow up in one week.  

## 2020-04-09 NOTE — Progress Notes (Signed)
Patient: [name]  Service Category: E/M  Provider: Gillis Santa, MD  DOB: [birth year]  DOS: 04/09/2020  Location: Office  MRN: [id number]  Setting: Ambulatory outpatient  Referring Provider: Derinda Late, MD  Type: Established Patient  Specialty: Interventional Pain Management  PCP: Derinda Late, MD  Location: Home  Delivery: TeleHealth     Virtual Encounter - Pain Management PROVIDER NOTE: Information contained herein reflects review and annotations entered in association with encounter. Interpretation of such information and data should be left to medically-trained personnel. Information provided to patient can be located elsewhere in the medical record under "Patient Instructions". Document created using STT-dictation technology, any transcriptional errors that may result from process are unintentional.    Contact & Pharmacy Preferred: [phone] Home: [phone] (home) Mobile: [phone] (mobile) E-mail: No e-mail address on record  [id number]Phone: [phone]: [phone]  Pre-screening  Mr. CMegan Salonoffered "in-person" vs "virtual" encounter. He indicated preferring virtual for this encounter.   Reason COVID-19*  Social distancing based on CDC and AMA recommendations.   I contacted Curtis Armatoon 04/09/2020 via telephone.      I clearly identified myself as BGillis Santa MD. I verified that I was speaking with the correct person using two identifiers (Name: [name] and date of birth: [birth year].  This visit was completed via telephone due to the restrictions of the COVID-19 pandemic. All issues as above were discussed and addressed but no physical exam was performed. If it was felt that the patient should be evaluated in the office, they were directed there. The patient verbally consented to this visit. Patient was unable to complete an  audio/visual visit due to Technical difficulties and/or Lack of internet. Due to the catastrophic nature of the COVID-19 pandemic, this visit was done through audio contact only.  Location of the patient: home address (see Epic for details)  Location of the provider: office  Consent I sought verbal advanced consent from Curtis Constablefor virtual visit interactions. I informed Mr. CGolebiewskiof possible security and privacy concerns, risks, and limitations associated with providing "not-in-person" medical evaluation and management services. I also informed Mr. CKitchingsof the availability of "in-person" appointments. Finally, I informed him that there would be a charge for the virtual visit and that he could be  personally, fully or partially, financially responsible for it. Mr. CHooseexpressed understanding and agreed to proceed.   Historic Elements   Mr. RMasiyah Engenis a 74y.o. year old, male patient evaluated today after his last contact with our practice on 04/05/2020. Mr. CTibbitts has a past medical history of Chronic back pain, COPD (chronic obstructive pulmonary disease) ([place], Depression, Diabetes mellitus without complication ([place], Hyperlipidemia, Hypertension, Renal cell carcinoma ([place], and Sleep apnea. He also  has a past surgical history that includes colonoscopys; Upper gi endoscopy; Flexible sigmoidoscopy; Gastric bypass; Nephrectomy; open reduction and internal fixation, right distal radius; Carpal tunnel release; right knee arthroscopy; Esophagogastroduodenoscopy (egd) with propofol (N/A, 07/29/2017); Colonoscopy with propofol (N/A, 07/29/2017); and Cholecystectomy. Mr. CKinsellahas a current medication list which includes the following prescription(s): apixaban, atorvastatin, carvedilol, duloxetine, furosemide, glipizide, levofloxacin, multivitamin, [START ON 06/14/2020] oxycodone-acetaminophen, pantoprazole, potassium chloride, ropinirole, sucralfate, and triamcinolone  cream. He  reports that he has quit smoking. He has a 30.00 pack-year smoking history. His smokeless tobacco use includes chew. He reports that he does not drink alcohol or use drugs. Mr. CPortela  has No Known Allergies.   HPI  Today, he is being contacted for medication management.   Patient states that he was admitted to the hospital for  pneumonia April 11 to April 13. He states that the pneumonia has improved and he feels like he is back to his baseline from a respiratory standpoint. In regards to his pain status, he states that he is doing fairly well. He continues to have persistent low back pain with weakness of his legs. He has been working with physical therapy for his chronic lumbar radicular pain. He states that it has been helping somewhat. He continues his oxycodone as prescribed although he does not take it regularly he states. He still has 1 prescription remaining at his pharmacy that he can pick up. I will have the patient discontinue this tizanidine since he is not taking this regularly. He continues his Cymbalta as prescribed.  Pharmacotherapy Assessment  Analgesic: 03/13/2020  1   01/10/2020  Oxycodone-Acetaminophen 10-325  90.00  30 Bi Lat   [id number]   Nor (2541)   0  45.00 MME  Medicare   [place]   Monitoring: [place] PMP: PDMP reviewed during this encounter.       Pharmacotherapy: No side-effects or adverse reactions reported. Compliance: No problems identified. Effectiveness: Clinically acceptable. Plan: Refer to "POC".  Laboratory Chemistry Profile   Renal Lab Results  Component Value Date   BUN 44 (H) 03/26/2020   CREATININE 1.67 (H) 03/26/2020   GFRAA 47 (L) 03/26/2020   GFRNONAA 41 (L) 03/26/2020     Hepatic Lab Results  Component Value Date   AST 13 (L) 03/25/2020   ALT 21 03/25/2020   ALBUMIN 3.7 03/25/2020   ALKPHOS 185 (H) 03/25/2020   LIPASE 209 02/12/2014     Electrolytes Lab Results  Component Value Date   NA 135 03/26/2020   K 4.0 03/26/2020   CL 110  03/26/2020   CALCIUM 8.7 (L) 03/26/2020   MG 2.2 07/05/2019   PHOS 3.2 01/12/2020     Bone No results found for: VD25OH, [id number], HA1937TK2, IO9735HG9, 25OHVITD1, 25OHVITD2, 25OHVITD3, TESTOFREE, TESTOSTERONE   Inflammation (CRP: Acute Phase) (ESR: Chronic Phase) Lab Results  Component Value Date   LATICACIDVEN 1.1 03/25/2020       Note: Above Lab results reviewed.  Imaging  US Venous Img Lower Unilateral Left (DVT) CLINICAL DATA:  Evaluate for left lower extremity venous thrombus.  EXAM: LEFT LOWER EXTREMITY VENOUS DOPPLER ULTRASOUND  TECHNIQUE: Gray-scale sonography with compression, as well as color and duplex ultrasound, were performed to evaluate the deep venous system(s) from the level of the common femoral vein through the popliteal and proximal calf veins.  COMPARISON:  01/10/2020 and 01/20/2017  FINDINGS: VENOUS  Normal compressibility of the common femoral, superficial femoral, and popliteal veins. Visualized portions of profunda femoral vein and great saphenous vein unremarkable. No filling defects to suggest DVT on grayscale or color Doppler imaging. Doppler waveforms show normal direction of venous flow, normal respiratory phasicity and response to augmentation.  Occlusive noncompressible thrombus is present over the veins of the lower leg involving the posterior tibial and peroneal veins. This may be acute or chronic as patient had similar findings and February 2018.  Limited views of the contralateral common femoral vein are unremarkable.  OTHER  None.  Limitations: none  IMPRESSION: Occlusive noncompressible thrombus evident in the left lower leg veins involving the posterior tibial and peroneal veins as this may be acute or chronic. Patient had similar findings in 2018.  Left common femoral to popliteal veins are normal.  Electronically Signed   By: Marin Olp M.D.   On: 03/25/2020 10:40 DG Chest Port 1 View CLINICAL DATA:   Initial evaluation for acute shortness of breath.  EXAM: PORTABLE CHEST 1 VIEW  COMPARISON:  Prior radiograph from 11/22/2019.  FINDINGS: Cardiomegaly. Mediastinal silhouette within normal limits.  Lungs hypoinflated. Parenchymal opacity seen within the mid and lower left lung, concerning for pneumonia. Underlying pulmonary interstitial congestion without overt pulmonary edema. No pleural effusion. No pneumothorax.  No acute osseous finding.  IMPRESSION: Patchy opacity within the mid and lower left lung, concerning for pneumonia.  Electronically Signed   By: Jeannine Boga M.D.   On: 03/25/2020 06:43  Assessment  The primary encounter diagnosis was Lumbar radiculopathy. Diagnoses of Lumbar degenerative disc disease, Bilateral stenosis of lateral recess of lumbar spine (L3-L4), Ankylosis of lumbar spine, Neuroforaminal stenosis of lumbar spine (left, L5/S1), Spinal stenosis, lumbar region, with neurogenic claudication, and Chronic pain syndrome were also pertinent to this visit.  Plan of Care   Mr. Burnis Halling has a current medication list which includes the following long-term medication(s): apixaban, atorvastatin, carvedilol, duloxetine, glipizide, pantoprazole, ropinirole, and sucralfate.  Pharmacotherapy (Medications Ordered): Meds ordered this encounter  Medications  . oxyCODONE-acetaminophen (PERCOCET) 10-325 MG tablet    Sig: Take 1 tablet by mouth every 8 (eight) hours as needed for pain. Must last 30 days.    Dispense:  90 tablet    Refill:  0    Chronic Pain. (STOP Act - Not applicable). Fill one day early if closed on scheduled refill date.    Follow-up plan:   Return if symptoms worsen or fail to improve.    Recent Visits Date Type Provider Dept  01/10/20 Office Visit Gillis Santa, MD [facility]  Showing recent visits within past 90 days and meeting all other requirements   Future Appointments No visits were found meeting these  conditions.  Showing future appointments within next 90 days and meeting all other requirements   I discussed the assessment and treatment plan with the patient. The patient was provided an opportunity to ask questions and all were answered. The patient agreed with the plan and demonstrated an understanding of the instructions.  Patient advised to call back or seek an in-person evaluation if the symptoms or condition worsens.  Duration of encounter: 25 minutes.  Note by: Gillis Santa, MD Date: 04/09/2020; Time: 1:23 PM

## 2020-04-16 ENCOUNTER — Ambulatory Visit (INDEPENDENT_AMBULATORY_CARE_PROVIDER_SITE_OTHER): Payer: Medicare Other | Admitting: Nurse Practitioner

## 2020-04-16 ENCOUNTER — Other Ambulatory Visit: Payer: Self-pay

## 2020-04-16 ENCOUNTER — Encounter (INDEPENDENT_AMBULATORY_CARE_PROVIDER_SITE_OTHER): Payer: Self-pay

## 2020-04-16 VITALS — BP 122/75 | HR 88 | Resp 16 | Wt 260.0 lb

## 2020-04-16 DIAGNOSIS — I890 Lymphedema, not elsewhere classified: Secondary | ICD-10-CM

## 2020-04-16 NOTE — Progress Notes (Signed)
History of Present Illness  There is no documented history at this time  Assessments & Plan   There are no diagnoses linked to this encounter.    Additional instructions  Subjective:  Patient presents with venous ulcer of the Left lower extremity.    Procedure:  3 layer unna wrap was placed Left lower extremity.   Plan:   Follow up in one week.  

## 2020-04-23 ENCOUNTER — Encounter (INDEPENDENT_AMBULATORY_CARE_PROVIDER_SITE_OTHER): Payer: Medicare Other

## 2020-04-24 ENCOUNTER — Ambulatory Visit (INDEPENDENT_AMBULATORY_CARE_PROVIDER_SITE_OTHER): Payer: Medicare Other | Admitting: Vascular Surgery

## 2020-04-24 ENCOUNTER — Other Ambulatory Visit: Payer: Self-pay

## 2020-04-24 ENCOUNTER — Encounter (INDEPENDENT_AMBULATORY_CARE_PROVIDER_SITE_OTHER): Payer: Self-pay | Admitting: Vascular Surgery

## 2020-04-24 VITALS — BP 148/75 | HR 92 | Resp 16 | Wt 262.0 lb

## 2020-04-24 DIAGNOSIS — I10 Essential (primary) hypertension: Secondary | ICD-10-CM | POA: Diagnosis not present

## 2020-04-24 DIAGNOSIS — E785 Hyperlipidemia, unspecified: Secondary | ICD-10-CM | POA: Diagnosis not present

## 2020-04-24 DIAGNOSIS — I890 Lymphedema, not elsewhere classified: Secondary | ICD-10-CM

## 2020-04-24 DIAGNOSIS — E1122 Type 2 diabetes mellitus with diabetic chronic kidney disease: Secondary | ICD-10-CM | POA: Diagnosis not present

## 2020-04-24 NOTE — Progress Notes (Signed)
MRN : [id number]  [name] is a 74 y.o. ([birth year]) male who presents with chief complaint of  Chief Complaint  Patient presents with  . Follow-up    unna check  .  History of Present Illness: Patient returns today in follow up of his leg swelling.  His legs are doing better.  He has been doing in [employer] until taking those off last night.  He is using lymphedema pump regularly and this seems to help.  He is also elevating his legs as much as possible.  His skin has healed and his wounds are gone at this point.  His legs look as good as I have seen them in a long time.  He also says that he stopped Lipitor because he was having such weakness and tiredness in his legs.  He says this is at least 50% better after stopping Lipitor.  Current Outpatient Medications  Medication Sig Dispense Refill  . apixaban (ELIQUIS) 5 MG TABS tablet Take 5 mg by mouth every 12 (twelve) hours.     Marland Kitchen atorvastatin (LIPITOR) 10 MG tablet Take 10 mg by mouth daily.    . carvedilol (COREG) 6.25 MG tablet Take 6.25 mg by mouth 2 (two) times daily with a meal.    . DULoxetine (CYMBALTA) 60 MG capsule 120 mg daily.     . furosemide (LASIX) 40 MG tablet Take 40 mg by mouth 2 (two) times daily.    Marland Kitchen glipiZIDE (GLUCOTROL XL) 2.5 MG 24 hr tablet Take 3 tablets (7.5 mg total) by mouth daily with breakfast. 30 tablet 1  . levofloxacin (LEVAQUIN) 750 MG tablet Take 1 tablet (750 mg total) by mouth daily. 3 tablet 0  . Multiple Vitamin (MULTIVITAMIN) tablet Take 1 tablet by mouth daily.    Derrill Memo ON 06/14/2020] oxyCODONE-acetaminophen (PERCOCET) 10-325 MG tablet Take 1 tablet by mouth every 8 (eight) hours as needed for pain. Must last 30 days. 90 tablet 0  . pantoprazole (PROTONIX) 40 MG tablet Take 40 mg by mouth 2 (two) times daily.     . potassium chloride (KLOR-CON) 20 MEQ packet Take 20 mEq by mouth daily.     Marland Kitchen rOPINIRole (REQUIP) 1 MG tablet Take 2 tablets (2 mg total) by mouth 2 (two) times daily.  60 tablet 1  . sucralfate (CARAFATE) 1 g tablet Take 1 g by mouth 3 (three) times daily.     Marland Kitchen triamcinolone cream (KENALOG) 0.1 % Apply 1 application topically 2 (two) times daily.      No current facility-administered medications for this visit.    Past Medical History:  Diagnosis Date  . Chronic back pain   . COPD (chronic obstructive pulmonary disease) ([place])   . Depression   . Diabetes mellitus without complication ([place])   . Hyperlipidemia   . Hypertension   . Renal cell carcinoma ([place])   . Sleep apnea     Past Surgical History:  Procedure Laterality Date  . CARPAL TUNNEL RELEASE    . CHOLECYSTECTOMY    . COLONOSCOPY WITH PROPOFOL N/A 07/29/2017   Procedure: COLONOSCOPY WITH PROPOFOL;  Surgeon: Manya Silvas, MD;  Location: [facility] ENDOSCOPY;  Service: Endoscopy;  Laterality: N/A;  . colonoscopys    . ESOPHAGOGASTRODUODENOSCOPY (EGD) WITH PROPOFOL N/A 07/29/2017   Procedure: ESOPHAGOGASTRODUODENOSCOPY (EGD) WITH PROPOFOL;  Surgeon: Manya Silvas, MD;  Location[facility] ENDOSCOPY;  Service: Endoscopy;  Laterality: N/A;  . FLEXIBLE SIGMOIDOSCOPY    . GASTRIC BYPASS    .  NEPHRECTOMY    . open reduction and internal fixation, right distal radius    . right knee arthroscopy    . UPPER GI ENDOSCOPY       Social History   Tobacco Use  . Smoking status: Former Smoker    Packs/day: 1.00    Years: 30.00    Pack years: 30.00  . Smokeless tobacco: Current User    Types: Chew  Substance Use Topics  . Alcohol use: No  . Drug use: No      Family History  Problem Relation Age of Onset  . Diabetes Mother   . Cancer Mother   . Heart disease Father     No Known Allergies  Constitutional: [] ??Weight loss[] ??Fever[] ??Chills Cardiac:[] ??Chest pain[] ??Atrial Fibrillation[] ??Palpitations [] ??Shortness of breath when laying flat [] ??Shortness of breath with exertion. [] ??Shortness of breath at rest Vascular: [] ??Pain in legs with walking[] ??Pain in  legswith standing[] ??Pain in legs when laying flat [] ??Claudication [] ??Pain in feet when laying flat [] ??History of DVT [] ??Phlebitis [x] ??Swelling in legs [x] ??Varicose veins [] ??Non-healing ulcers Pulmonary: [] ??Uses home oxygen [] ??Productive cough[] ??Hemoptysis [] ??Wheeze [] ??COPD [] ??Asthma Neurologic: [] ??Dizziness[] ??Seizures [] ??Blackouts[] ??History of stroke [] ??History of TIA[] ??Aphasia [] ??Temporary Blindness[] ??Weaknessor numbness in arm [] ??Weakness or numbnessin leg Musculoskeletal:[] ??Joint swelling [] ??Joint pain [x] ??Low back pain [] ??History of Knee Replacement [] ??Arthritis [] ??back Surgeries[] ??Spinal Stenosis  Hematologic:[] ??Easy bruising[] ??Easy bleeding [] ??Hypercoagulable state [] ??Anemic Gastrointestinal:[] ??Diarrhea [] ??Vomiting[] ??Gastroesophageal reflux/heartburn[] ??Difficulty swallowing. [] ??Abdominal pain Genitourinary: [] ??Chronic kidney disease [] ??Difficulturination [] ??Anuric[] ??Blood in urine [] ??Frequenturination [] ??Burning with urination[] ??Hematuria Skin: [] ??Rashes [] ??Ulcers [x] ??Wounds Psychological: [x] ??History of anxiety[] ??History of major depression [] ??Memory Difficulties  Physical Examination  BP (!) 148/75 (BP Location: Right Arm)   Pulse 92   Resp 16   Wt 262 lb (118.8 kg)   BMI 35.53 kg/m  Gen:  WD/WN, NAD Head: [place]/AT, No temporalis wasting. Ear/Nose/Throat: Hearing grossly intact, nares w/o erythema or drainage Eyes: Conjunctiva clear. Sclera non-icteric Neck: Supple.  Trachea midline Pulmonary:  Good air movement, no use of accessory muscles.  Cardiac: RRR, no JVD Vascular:  Vessel Right Left  Radial Palpable Palpable               Musculoskeletal: M/S 5/5 throughout.  No deformity or atrophy.  Trace right lower extremity edema, 1+ left lower extremity edema. Neurologic: Sensation grossly intact in extremities.  Symmetrical.   Speech is fluent.  Psychiatric: Judgment intact, Mood & affect appropriate for pt's clinical situation. Dermatologic: No rashes or ulcers noted.  No cellulitis or open wounds.       Labs Recent Results (from the past 2160 hour(s))  Ferritin     Status: Abnormal   Collection Time: 02/16/20 11:38 AM  Result Value Ref Range   Ferritin 11 (L) 24 - 336 ng/mL    Comment: Performed at [facility], [address] [place], [place] [address]  CBC with Differential     Status: Abnormal   Collection Time: 02/16/20 11:38 AM  Result Value Ref Range   WBC 7.3 4.0 - 10.5 K/uL   RBC 3.60 (L) 4.22 - 5.81 MIL/uL   Hemoglobin 9.4 (L) 13.0 - 17.0 g/dL   HCT 32.5 (L) 39.0 - 52.0 %   MCV 90.3 80.0 - 100.0 fL   MCH 26.1 26.0 - 34.0 pg   MCHC 28.9 (L) 30.0 - 36.0 g/dL   RDW 17.5 (H) 11.5 - 15.5 %   Platelets 195 150 - 400 K/uL   nRBC 0.0 0.0 - 0.2 %   Neutrophils Relative % 79 %   Neutro Abs 5.8 1.7 - 7.7 K/uL   Lymphocytes Relative 13 %  Lymphs Abs 1.0 0.7 - 4.0 K/uL   Monocytes Relative 5 %   Monocytes Absolute 0.3 0.1 - 1.0 K/uL   Eosinophils Relative 2 %   Eosinophils Absolute 0.1 0.0 - 0.5 K/uL   Basophils Relative 1 %   Basophils Absolute 0.0 0.0 - 0.1 K/uL   Immature Granulocytes 0 %   Abs Immature Granulocytes 0.03 0.00 - 0.07 K/uL    Comment: Performed at [facility], [address]  [place], [place] [address]  Iron and TIBC     Status: Abnormal   Collection Time: 02/16/20 11:38 AM  Result Value Ref Range   Iron 44 (L) 45 - 182 ug/dL   TIBC 379 250 - 450 ug/dL   Saturation Ratios 12 (L) 17.9 - 39.5 %   UIBC 335 ug/dL    Comment: Performed at [facility], [address] [place], [place] [address]  Iron and TIBC     Status: Abnormal   Collection Time: 03/16/20 10:10 AM  Result Value Ref Range   Iron 47 45 - 182 ug/dL   TIBC 407 250 - 450 ug/dL   Saturation Ratios 12 (L) 17.9 - 39.5 %   UIBC 360 ug/dL    Comment: Performed at [facility], [address] [place], [place] [address]  Ferritin     Status: Abnormal   Collection Time: 03/16/20 10:10 AM  Result Value Ref Range   Ferritin 7 (L) 24 - 336 ng/mL    Comment: Performed at [facility], [address] [place], [place] [address]  CBC with Differential     Status: Abnormal   Collection Time: 03/16/20 10:10 AM  Result Value Ref Range   WBC 8.3 4.0 - 10.5 K/uL   RBC 3.90 (L) 4.22 - 5.81 MIL/uL   Hemoglobin 10.4 (L) 13.0 - 17.0 g/dL   HCT 34.9 (L) 39.0 - 52.0 %   MCV 89.5 80.0 - 100.0 fL   MCH 26.7 26.0 - 34.0 pg   MCHC 29.8 (L) 30.0 - 36.0 g/dL   RDW 17.3 (H) 11.5 - 15.5 %   Platelets 202 150 - 400 K/uL   nRBC 0.0 0.0 - 0.2 %   Neutrophils Relative % 82 %   Neutro Abs 6.7 1.7 - 7.7 K/uL   Lymphocytes Relative 11 %   Lymphs Abs 0.9 0.7 - 4.0 K/uL   Monocytes Relative 5 %   Monocytes Absolute 0.4 0.1 - 1.0 K/uL   Eosinophils Relative 2 %   Eosinophils Absolute 0.2 0.0 - 0.5 K/uL   Basophils Relative 0 %   Basophils Absolute 0.0 0.0 - 0.1 K/uL   Immature Granulocytes 0 %   Abs Immature Granulocytes 0.03 0.00 - 0.07 K/uL    Comment: Performed at [facility], [address] [place], [place] [address]  CBC with Differential     Status: Abnormal   Collection Time: 03/25/20  5:57 AM  Result Value Ref Range   WBC 11.1 (H) 4.0 - 10.5 K/uL   RBC 4.10 (L) 4.22 - 5.81 MIL/uL   Hemoglobin 11.1 (L) 13.0 - 17.0 g/dL   HCT 37.3 (L) 39.0 - 52.0 %   MCV 91.0 80.0 - 100.0 fL   MCH 27.1 26.0 - 34.0 pg   MCHC 29.8 (L) 30.0 - 36.0 g/dL   RDW 18.5 (H) 11.5 - 15.5 %   Platelets 204 150 - 400 K/uL   nRBC 0.0 0.0 - 0.2 %   Neutrophils Relative % 89 %  Neutro Abs 9.8 (H) 1.7 - 7.7 K/uL   Lymphocytes Relative 5 %   Lymphs Abs 0.6 (L) 0.7 - 4.0 K/uL   Monocytes Relative 5 %   Monocytes Absolute 0.5 0.1 - 1.0 K/uL   Eosinophils Relative 1 %   Eosinophils Absolute 0.1 0.0 - 0.5 K/uL   Basophils Relative 0 %   Basophils Absolute 0.0 0.0 - 0.1 K/uL    Immature Granulocytes 0 %   Abs Immature Granulocytes 0.04 0.00 - 0.07 K/uL    Comment: Performed at [facility], [address] [place], [place] [address]  Comprehensive metabolic panel     Status: Abnormal   Collection Time: 03/25/20  5:57 AM  Result Value Ref Range   Sodium 139 135 - 145 mmol/L   Potassium 4.4 3.5 - 5.1 mmol/L   Chloride 113 (H) 98 - 111 mmol/L   CO2 18 (L) 22 - 32 mmol/L   Glucose, Bld 316 (H) 70 - 99 mg/dL    Comment: Glucose reference range applies only to samples taken after fasting for at least 8 hours.   BUN 43 (H) 8 - 23 mg/dL   Creatinine, Ser 1.60 (H) 0.61 - 1.24 mg/dL   Calcium 8.8 (L) 8.9 - 10.3 mg/dL   Total Protein 7.3 6.5 - 8.1 g/dL   Albumin 3.7 3.5 - 5.0 g/dL   AST 13 (L) 15 - 41 U/L   ALT 21 0 - 44 U/L   Alkaline Phosphatase 185 (H) 38 - 126 U/L   Total Bilirubin 0.7 0.3 - 1.2 mg/dL   GFR calc non Af Amer 43 (L) >60 mL/min   GFR calc Af Amer 49 (L) >60 mL/min   Anion gap 8 5 - 15    Comment: Performed at [facility], [address] [place], [place] [address]  Brain natriuretic peptide     Status: None   Collection Time: 03/25/20  5:57 AM  Result Value Ref Range   B Natriuretic Peptide 31.0 0.0 - 100.0 pg/mL    Comment: Performed at [facility], [place], [place] [address]  Troponin I (High Sensitivity)     Status: None   Collection Time: 03/25/20  5:57 AM  Result Value Ref Range   Troponin I (High Sensitivity) 7 <18 ng/L    Comment: (NOTE) Elevated high sensitivity troponin I (hsTnI) values and significant  changes across serial measurements may suggest ACS but many other  chronic and acute conditions are known to elevate hsTnI results.  Refer to the "Links" section for chest pain algorithms and additional  guidance. Performed at [facility], [address] [place],  [address]   Respiratory Panel by RT PCR (Flu A&B, Covid) - Nasopharyngeal Swab     Status: None    Collection Time: 03/25/20  5:57 AM   Specimen: Nasopharyngeal Swab  Result Value Ref Range   SARS Coronavirus 2 by RT PCR NEGATIVE NEGATIVE    Comment: (NOTE) SARS-CoV-2 target nucleic acids are NOT DETECTED. The SARS-CoV-2 RNA is generally detectable in upper respiratoy specimens during the acute phase of infection. The lowest concentration of SARS-CoV-2 viral copies this assay can detect is 131 copies/mL. A negative result does not preclude SARS-Cov-2 infection and should not be used as the sole basis for treatment or other patient management decisions. A negative result may occur with  improper specimen collection/handling, submission of specimen other than nasopharyngeal swab, presence of viral mutation(s) within the areas targeted by this assay,  and inadequate number of viral copies (<131 copies/mL). A negative result must be combined with clinical observations, patient history, and epidemiological information. The expected result is Negative. Fact Sheet for Patients:  PinkCheek.be Fact Sheet for Healthcare Providers:  GravelBags.it This test is not yet ap proved or cleared by the [place] FDA and  has been authorized for detection and/or diagnosis of SARS-CoV-2 by FDA under an Emergency Use Authorization (EUA). This EUA will remain  in effect (meaning this test can be used) for the duration of the COVID-19 declaration under Section 564(b)(1) of the Act, 21 U.S.C. section 360bbb-3(b)(1), unless the authorization is terminated or revoked sooner.    Influenza A by PCR NEGATIVE NEGATIVE   Influenza B by PCR NEGATIVE NEGATIVE    Comment: (NOTE) The Xpert Xpress SARS-CoV-2/FLU/RSV assay is intended as an aid in  the diagnosis of influenza from Nasopharyngeal swab specimens and  should not be used as a sole basis for treatment. Nasal washings and  aspirates are unacceptable for Xpert Xpress SARS-CoV-2/FLU/RSV  testing.  Fact Sheet for Patients: PinkCheek.be Fact Sheet for Healthcare Providers: GravelBags.it This test is not yet approved or cleared by the [place] FDA and  has been authorized for detection and/or diagnosis of SARS-CoV-2 by  FDA under an Emergency Use Authorization (EUA). This EUA will remain  in effect (meaning this test can be used) for the duration of the  Covid-19 declaration under Section 564(b)(1) of the Act, 21  U.S.C. section 360bbb-3(b)(1), unless the authorization is  terminated or revoked. Performed at [facility], [place], [place] [address]   Legionella Pneumophila Serogp 1 Ur Ag     Status: None   Collection Time: 03/25/20  5:57 AM  Result Value Ref Range   L. pneumophila Serogp 1 Ur Ag Negative Negative    Comment: (NOTE) Presumptive negative for L. pneumophila serogroup 1 antigen in urine, suggesting no recent or current infection. Legionnaires' disease cannot be ruled out since other serogroups and species may also cause disease. Performed At: [facility] [place], [place] [id number] Rush Farmer MD [id number]    Source of Sample URINE, RANDOM     Comment: Performed at [facility], [address] [place], [place] [address]  Strep pneumoniae urinary antigen     Status: None   Collection Time: 03/25/20  5:57 AM  Result Value Ref Range   Strep Pneumo Urinary Antigen NEGATIVE NEGATIVE    Comment:        Infection due to S. pneumoniae cannot be absolutely ruled out since the antigen present may be below the detection limit of the test.   Lactic acid, plasma     Status: None   Collection Time: 03/25/20  6:15 AM  Result Value Ref Range   Lactic Acid, Venous 1.1 0.5 - 1.9 mmol/L    Comment: Performed at [facility], [address] [place], [place] [address]  Culture, blood (routine x 2)     Status: None   Collection Time:  03/25/20  7:23 AM   Specimen: BLOOD  Result Value Ref Range   Specimen Description BLOOD RIGHT ANTECUBITAL    Special Requests      BOTTLES DRAWN AEROBIC AND ANAEROBIC Blood Culture results may not be optimal due to an inadequate volume of blood received in culture bottles   Culture      NO GROWTH 5 DAYS Performed at [facility], [address] [place],  [address]  Report Status 03/30/2020 FINAL   Culture, blood (routine x 2)     Status: None   Collection Time: 03/25/20  7:23 AM   Specimen: BLOOD  Result Value Ref Range   Specimen Description BLOOD LEFT ANTECUBITAL    Special Requests      BOTTLES DRAWN AEROBIC AND ANAEROBIC Blood Culture results may not be optimal due to an inadequate volume of blood received in culture bottles   Culture      NO GROWTH 5 DAYS Performed at [facility], [address], [place], [place] [address]    Report Status 03/30/2020 FINAL   Glucose, capillary     Status: Abnormal   Collection Time: 03/25/20  8:22 AM  Result Value Ref Range   Glucose-Capillary 269 (H) 70 - 99 mg/dL    Comment: Glucose reference range applies only to samples taken after fasting for at least 8 hours.  Glucose, capillary     Status: Abnormal   Collection Time: 03/25/20  1:25 PM  Result Value Ref Range   Glucose-Capillary 384 (H) 70 - 99 mg/dL    Comment: Glucose reference range applies only to samples taken after fasting for at least 8 hours.  Procalcitonin - Baseline     Status: None   Collection Time: 03/25/20  3:50 PM  Result Value Ref Range   Procalcitonin 0.90 ng/mL    Comment:        Interpretation: PCT > 0.5 ng/mL and <= 2 ng/mL: Systemic infection (sepsis) is possible, but other conditions are known to elevate PCT as well. (NOTE)       Sepsis PCT Algorithm           Lower Respiratory Tract                                      Infection PCT Algorithm    ----------------------------     ----------------------------         PCT <  0.25 ng/mL                PCT < 0.10 ng/mL         Strongly encourage             Strongly discourage   discontinuation of antibiotics    initiation of antibiotics    ----------------------------     -----------------------------       PCT 0.25 - 0.50 ng/mL            PCT 0.10 - 0.25 ng/mL               OR       >80% decrease in PCT            Discourage initiation of                                            antibiotics      Encourage discontinuation           of antibiotics    ----------------------------     -----------------------------         PCT >= 0.50 ng/mL              PCT 0.26 - 0.50 ng/mL  AND       <80% decrease in PCT             Encourage initiation of                                             antibiotics       Encourage continuation           of antibiotics    ----------------------------     -----------------------------        PCT >= 0.50 ng/mL                  PCT > 0.50 ng/mL               AND         increase in PCT                  Strongly encourage                                      initiation of antibiotics    Strongly encourage escalation           of antibiotics                                     -----------------------------                                           PCT <= 0.25 ng/mL                                                 OR                                        > 80% decrease in PCT                                     Discontinue / Do not initiate                                             antibiotics Performed at [facility], [address] [place], [place] [address]   HIV Antibody (routine testing w rflx)     Status: None   Collection Time: 03/25/20  3:50 PM  Result Value Ref Range   HIV Screen 4th Generation wRfx NON REACTIVE NON REACTIVE    Comment: Performed at [facility], [place] [address], [place], [place] [address]  Glucose, capillary     Status: Abnormal   Collection Time: 03/25/20  4:10 PM   Result Value Ref Range   Glucose-Capillary 395 (H) 70 - 99 mg/dL    Comment:  Glucose reference range applies only to samples taken after fasting for at least 8 hours.  Glucose, capillary     Status: Abnormal   Collection Time: 03/25/20  9:03 PM  Result Value Ref Range   Glucose-Capillary 384 (H) 70 - 99 mg/dL    Comment: Glucose reference range applies only to samples taken after fasting for at least 8 hours.   Comment 1 Notify RN   Basic metabolic panel     Status: Abnormal   Collection Time: 03/26/20  5:27 AM  Result Value Ref Range   Sodium 135 135 - 145 mmol/L   Potassium 4.0 3.5 - 5.1 mmol/L   Chloride 110 98 - 111 mmol/L   CO2 18 (L) 22 - 32 mmol/L   Glucose, Bld 274 (H) 70 - 99 mg/dL    Comment: Glucose reference range applies only to samples taken after fasting for at least 8 hours.   BUN 44 (H) 8 - 23 mg/dL   Creatinine, Ser 1.67 (H) 0.61 - 1.24 mg/dL   Calcium 8.7 (L) 8.9 - 10.3 mg/dL   GFR calc non Af Amer 41 (L) >60 mL/min   GFR calc Af Amer 47 (L) >60 mL/min   Anion gap 7 5 - 15    Comment: Performed at [facility], [address] [place], [place] [address]  CBC     Status: Abnormal   Collection Time: 03/26/20  5:27 AM  Result Value Ref Range   WBC 11.8 (H) 4.0 - 10.5 K/uL   RBC 3.74 (L) 4.22 - 5.81 MIL/uL   Hemoglobin 10.1 (L) 13.0 - 17.0 g/dL   HCT 33.4 (L) 39.0 - 52.0 %   MCV 89.3 80.0 - 100.0 fL   MCH 27.0 26.0 - 34.0 pg   MCHC 30.2 30.0 - 36.0 g/dL   RDW 18.8 (H) 11.5 - 15.5 %   Platelets 197 150 - 400 K/uL   nRBC 0.0 0.0 - 0.2 %    Comment: Performed at [facility], [address] [place], [place] [address]  Glucose, capillary     Status: Abnormal   Collection Time: 03/26/20  8:34 AM  Result Value Ref Range   Glucose-Capillary 274 (H) 70 - 99 mg/dL    Comment: Glucose reference range applies only to samples taken after fasting for at least 8 hours.  Glucose, capillary     Status: Abnormal   Collection Time: 03/26/20 12:14 PM   Result Value Ref Range   Glucose-Capillary 292 (H) 70 - 99 mg/dL    Comment: Glucose reference range applies only to samples taken after fasting for at least 8 hours.  Glucose, capillary     Status: Abnormal   Collection Time: 03/26/20  4:45 PM  Result Value Ref Range   Glucose-Capillary 305 (H) 70 - 99 mg/dL    Comment: Glucose reference range applies only to samples taken after fasting for at least 8 hours.  Glucose, capillary     Status: Abnormal   Collection Time: 03/26/20  8:55 PM  Result Value Ref Range   Glucose-Capillary 337 (H) 70 - 99 mg/dL    Comment: Glucose reference range applies only to samples taken after fasting for at least 8 hours.   Comment 1 Notify RN   Glucose, capillary     Status: Abnormal   Collection Time: 03/27/20  8:06 AM  Result Value Ref Range   Glucose-Capillary 283 (H) 70 - 99 mg/dL    Comment: Glucose reference range applies only to  samples taken after fasting for at least 8 hours.  Glucose, capillary     Status: Abnormal   Collection Time: 03/27/20 11:54 AM  Result Value Ref Range   Glucose-Capillary 235 (H) 70 - 99 mg/dL    Comment: Glucose reference range applies only to samples taken after fasting for at least 8 hours.   Comment 1 Notify RN     Radiology No results found.  Assessment/Plan Essential hypertension blood pressure control important in reducing the progression of atherosclerotic disease. On appropriate oral medications.   Diabetes ([place]) blood glucose control important in reducing the progression of atherosclerotic disease. Also, involved in wound healing. On appropriate medications.  Lymphedema The patient's legs are actually doing quite well today which is encouraging.  He is using lymphedema pump which is certainly help.  He has been doing Unna boots for many weeks now, but his skin is healed and his leg swelling is as good as I have seen it in a long time.  At this point, he is going to wear compression stockings, elevate  his legs, maintain good activity level, and use his lymphedema pumps regularly.  We will plan a 2 to 1-month follow-up to reassess his legs but will be happy to see him sooner if problems develop in the interim.  Hyperlipidemia Describes what sounds like an adverse reaction to statins.  He has now been off his Lipitor for about 2 weeks, and is noticed increased strength and less heaviness and weakness in his legs.  No other major changes other than continuing the treatment for his lymphedema.  He is going to discuss with his primary care physician whether or not to resume a different statin agent other than Lipitor or to stay off statins for some time at this point.    Leotis Pain, MD  04/24/2020 1:48 PM    This note was created with Dragon medical transcription system.  Any errors from dictation are purely unintentional

## 2020-04-24 NOTE — Assessment & Plan Note (Signed)
The patient's legs are actually doing quite well today which is encouraging.  He is using lymphedema pump which is certainly help.  He has been doing Unna boots for many weeks now, but his skin is healed and his leg swelling is as good as I have seen it in a long time.  At this point, he is going to wear compression stockings, elevate his legs, maintain good activity level, and use his lymphedema pumps regularly.  We will plan a 2 to 49-month follow-up to reassess his legs but will be happy to see him sooner if problems develop in the interim.

## 2020-04-24 NOTE — Assessment & Plan Note (Signed)
Describes what sounds like an adverse reaction to statins.  He has now been off his Lipitor for about 2 weeks, and is noticed increased strength and less heaviness and weakness in his legs.  No other major changes other than continuing the treatment for his lymphedema.  He is going to discuss with his primary care physician whether or not to resume a different statin agent other than Lipitor or to stay off statins for some time at this point.

## 2020-04-28 DIAGNOSIS — R200 Anesthesia of skin: Secondary | ICD-10-CM | POA: Insufficient documentation

## 2020-05-11 NOTE — Progress Notes (Signed)
[facility]  Telephone:([phone]) [phone] Fax:(336) [phone]  ID: [name] OB: Feb 04, 1946  MR#: [id number]  LKG#:[id number]  Patient Care Team: Derinda Late, MD as PCP - General (Family Medicine)   CHIEF COMPLAINT: Anemia in chronic kidney disease, mild iron deficiency.  INTERVAL HISTORY: Patient returns to clinic today for further evaluation and continuation of treatment.  He currently feels well and is asymptomatic.  He does not complain of weakness or fatigue. He continues to have chronic back pain.  He has no neurologic complaints.  He denies any recent fevers or illnesses.  He has a good appetite and denies weight loss.  He denies any chest pain, shortness of breath, cough, or hemoptysis.  He denies any abdominal pain.  He has no nausea, vomiting, constipation, or diarrhea.  He denies any melena or hematochezia.  He has no urinary complaints.  Patient offers no further specific complaints today.   REVIEW OF SYSTEMS:   Review of Systems  Constitutional: Negative.  Negative for fever, malaise/fatigue and weight loss.  Respiratory: Negative.  Negative for cough, hemoptysis and shortness of breath.   Cardiovascular: Negative.  Negative for chest pain and leg swelling.  Gastrointestinal: Negative.  Negative for abdominal pain and constipation.  Genitourinary: Negative.  Negative for flank pain and hematuria.  Musculoskeletal: Positive for back pain.  Skin: Negative.  Negative for rash.  Neurological: Negative.  Negative for dizziness, sensory change, focal weakness, weakness and headaches.  Psychiatric/Behavioral: Negative.  The patient is not nervous/anxious.     As per HPI. Otherwise, a complete review of systems is negative.  PAST MEDICAL HISTORY: Past Medical History:  Diagnosis Date  . Chronic back pain   . COPD (chronic obstructive pulmonary disease) ([place])   . Depression   . Diabetes mellitus without complication ([place])   . Hyperlipidemia   .  Hypertension   . Renal cell carcinoma ([place])   . Sleep apnea     PAST SURGICAL HISTORY: Past Surgical History:  Procedure Laterality Date  . CARPAL TUNNEL RELEASE    . CHOLECYSTECTOMY    . COLONOSCOPY WITH PROPOFOL N/A 07/29/2017   Procedure: COLONOSCOPY WITH PROPOFOL;  Surgeon: Manya Silvas, MD;  Location: [facility] ENDOSCOPY;  Service: Endoscopy;  Laterality: N/A;  . colonoscopys    . ESOPHAGOGASTRODUODENOSCOPY (EGD) WITH PROPOFOL N/A 07/29/2017   Procedure: ESOPHAGOGASTRODUODENOSCOPY (EGD) WITH PROPOFOL;  Surgeon: Manya Silvas, MD;  Location: [facility] ENDOSCOPY;  Service: Endoscopy;  Laterality: N/A;  . FLEXIBLE SIGMOIDOSCOPY    . GASTRIC BYPASS    . NEPHRECTOMY    . open reduction and internal fixation, right distal radius    . right knee arthroscopy    . UPPER GI ENDOSCOPY      FAMILY HISTORY: Family History  Problem Relation Age of Onset  . Diabetes Mother   . Cancer Mother   . Heart disease Father     ADVANCED DIRECTIVES (Y/N):  N  HEALTH MAINTENANCE: Social History   Tobacco Use  . Smoking status: Former Smoker    Packs/day: 1.00    Years: 30.00    Pack years: 30.00  . Smokeless tobacco: Current User    Types: Chew  Substance Use Topics  . Alcohol use: No  . Drug use: No     Colonoscopy:  PAP:  Bone density:  Lipid panel:  No Known Allergies  Current Outpatient Medications  Medication Sig Dispense Refill  . apixaban (ELIQUIS) 5 MG TABS tablet Take 5 mg by mouth every 12 (twelve) hours.     Marland Kitchen  carvedilol (COREG) 6.25 MG tablet Take 6.25 mg by mouth 2 (two) times daily with a meal.    . DULoxetine (CYMBALTA) 60 MG capsule 120 mg daily.     . furosemide (LASIX) 40 MG tablet Take 40 mg by mouth 2 (two) times daily.    Marland Kitchen glipiZIDE (GLUCOTROL XL) 2.5 MG 24 hr tablet Take 3 tablets (7.5 mg total) by mouth daily with breakfast. 30 tablet 1  . Multiple Vitamin (MULTIVITAMIN) tablet Take 1 tablet by mouth daily.    Derrill Memo ON 06/14/2020]  oxyCODONE-acetaminophen (PERCOCET) 10-325 MG tablet Take 1 tablet by mouth every 8 (eight) hours as needed for pain. Must last 30 days. 90 tablet 0  . pantoprazole (PROTONIX) 40 MG tablet Take 40 mg by mouth 2 (two) times daily.     . potassium chloride (KLOR-CON) 20 MEQ packet Take 20 mEq by mouth daily.     Marland Kitchen rOPINIRole (REQUIP) 1 MG tablet Take 2 tablets (2 mg total) by mouth 2 (two) times daily. 60 tablet 1  . sucralfate (CARAFATE) 1 g tablet Take 1 g by mouth 3 (three) times daily.     Marland Kitchen triamcinolone cream (KENALOG) 0.1 % Apply 1 application topically 2 (two) times daily.     Marland Kitchen atorvastatin (LIPITOR) 10 MG tablet Take 10 mg by mouth daily.     No current facility-administered medications for this visit.    OBJECTIVE: Vitals:   05/17/20 1420  BP: 118/69  Pulse: 79  Resp: 18  Temp: 98.4 F (36.9 C)  SpO2: 96%     Body mass index is 35.6 kg/m.    ECOG FS:0 - Asymptomatic  General: Well-developed, well-nourished, no acute distress. Eyes: Pink conjunctiva, anicteric sclera. HEENT: Normocephalic, moist mucous membranes. Lungs: No audible wheezing or coughing. Heart: Regular rate and rhythm. Abdomen: Soft, nontender, no obvious distention. Musculoskeletal: No edema, cyanosis, or clubbing. Neuro: Alert, answering all questions appropriately. Cranial nerves grossly intact. Skin: No rashes or petechiae noted. Psych: Normal affect.   LAB RESULTS:  Lab Results  Component Value Date   NA 135 03/26/2020   K 4.0 03/26/2020   CL 110 03/26/2020   CO2 18 (L) 03/26/2020   GLUCOSE 274 (H) 03/26/2020   BUN 44 (H) 03/26/2020   CREATININE 1.67 (H) 03/26/2020   CALCIUM 8.7 (L) 03/26/2020   PROT 7.3 03/25/2020   ALBUMIN 3.7 03/25/2020   AST 13 (L) 03/25/2020   ALT 21 03/25/2020   ALKPHOS 185 (H) 03/25/2020   BILITOT 0.7 03/25/2020   GFRNONAA 41 (L) 03/26/2020   GFRAA 47 (L) 03/26/2020    Lab Results  Component Value Date   WBC 8.1 05/16/2020   NEUTROABS 6.2 05/16/2020   HGB  11.5 (L) 05/16/2020   HCT 36.5 (L) 05/16/2020   MCV 91.7 05/16/2020   PLT 196 05/16/2020   Lab Results  Component Value Date   IRON 72 05/16/2020   TIBC 321 05/16/2020   IRONPCTSAT 23 05/16/2020   Lab Results  Component Value Date   FERRITIN 45 05/16/2020     STUDIES: No results found.  ASSESSMENT: Anemia in chronic kidney disease, mild iron deficiency.  PLAN:    1. Anemia in chronic kidney disease, mild iron deficiency: Patient's hemoglobin remains decreased, but improved to 11.7.  Iron stores are now within normal limits.  He does not require additional IV Feraheme today.  He last received treatment on March 23, 2020.  He does not require Retacrit at this time, but given his chronic renal insufficiency  this may be needed in the future if his hemoglobin falls persistently below 10 with normal iron stores.  Return to clinic in 3 months with repeat laboratory work, further evaluation, and continuation of treatment if needed. 2.  Chronic renal insufficiency: Chronic and unchanged.  Appreciate nephrology input.   3.  Vascular insufficiency: Continue follow-up with vascular surgery as scheduled. 4.  Back pain: Chronic and unchanged.  Continue follow-up and treatment per physical therapy.  I spent a total of 20 minutes reviewing chart data, face-to-face evaluation with the patient, counseling and coordination of care as detailed above.   Patient expressed understanding and was in agreement with this plan. He also understands that He can call clinic at any time with any questions, concerns, or complaints.    Lloyd Huger, MD   05/18/2020 6:44 AM

## 2020-05-16 ENCOUNTER — Other Ambulatory Visit: Payer: Self-pay

## 2020-05-16 ENCOUNTER — Inpatient Hospital Stay: Payer: Medicare Other | Attending: Oncology

## 2020-05-16 DIAGNOSIS — N189 Chronic kidney disease, unspecified: Secondary | ICD-10-CM | POA: Insufficient documentation

## 2020-05-16 DIAGNOSIS — Z85528 Personal history of other malignant neoplasm of kidney: Secondary | ICD-10-CM | POA: Insufficient documentation

## 2020-05-16 DIAGNOSIS — D631 Anemia in chronic kidney disease: Secondary | ICD-10-CM | POA: Diagnosis not present

## 2020-05-16 DIAGNOSIS — Z7901 Long term (current) use of anticoagulants: Secondary | ICD-10-CM | POA: Diagnosis not present

## 2020-05-16 DIAGNOSIS — M549 Dorsalgia, unspecified: Secondary | ICD-10-CM | POA: Insufficient documentation

## 2020-05-16 DIAGNOSIS — Z79899 Other long term (current) drug therapy: Secondary | ICD-10-CM | POA: Insufficient documentation

## 2020-05-16 DIAGNOSIS — G4730 Sleep apnea, unspecified: Secondary | ICD-10-CM | POA: Insufficient documentation

## 2020-05-16 DIAGNOSIS — G8929 Other chronic pain: Secondary | ICD-10-CM | POA: Insufficient documentation

## 2020-05-16 DIAGNOSIS — E119 Type 2 diabetes mellitus without complications: Secondary | ICD-10-CM | POA: Insufficient documentation

## 2020-05-16 DIAGNOSIS — Z87891 Personal history of nicotine dependence: Secondary | ICD-10-CM | POA: Insufficient documentation

## 2020-05-16 DIAGNOSIS — Z809 Family history of malignant neoplasm, unspecified: Secondary | ICD-10-CM | POA: Insufficient documentation

## 2020-05-16 DIAGNOSIS — J449 Chronic obstructive pulmonary disease, unspecified: Secondary | ICD-10-CM | POA: Diagnosis not present

## 2020-05-16 DIAGNOSIS — Z7984 Long term (current) use of oral hypoglycemic drugs: Secondary | ICD-10-CM | POA: Insufficient documentation

## 2020-05-16 DIAGNOSIS — D509 Iron deficiency anemia, unspecified: Secondary | ICD-10-CM | POA: Diagnosis not present

## 2020-05-16 DIAGNOSIS — I129 Hypertensive chronic kidney disease with stage 1 through stage 4 chronic kidney disease, or unspecified chronic kidney disease: Secondary | ICD-10-CM | POA: Insufficient documentation

## 2020-05-16 DIAGNOSIS — E785 Hyperlipidemia, unspecified: Secondary | ICD-10-CM | POA: Diagnosis not present

## 2020-05-16 DIAGNOSIS — F329 Major depressive disorder, single episode, unspecified: Secondary | ICD-10-CM | POA: Insufficient documentation

## 2020-05-16 LAB — IRON AND TIBC
Iron: 72 ug/dL (ref 45–182)
Saturation Ratios: 23 % (ref 17.9–39.5)
TIBC: 321 ug/dL (ref 250–450)
UIBC: 249 ug/dL

## 2020-05-16 LAB — CBC WITH DIFFERENTIAL/PLATELET
Abs Immature Granulocytes: 0.05 10*3/uL (ref 0.00–0.07)
Basophils Absolute: 0 10*3/uL (ref 0.0–0.1)
Basophils Relative: 1 %
Eosinophils Absolute: 0.2 10*3/uL (ref 0.0–0.5)
Eosinophils Relative: 2 %
HCT: 36.5 % — ABNORMAL LOW (ref 39.0–52.0)
Hemoglobin: 11.5 g/dL — ABNORMAL LOW (ref 13.0–17.0)
Immature Granulocytes: 1 %
Lymphocytes Relative: 16 %
Lymphs Abs: 1.3 10*3/uL (ref 0.7–4.0)
MCH: 28.9 pg (ref 26.0–34.0)
MCHC: 31.5 g/dL (ref 30.0–36.0)
MCV: 91.7 fL (ref 80.0–100.0)
Monocytes Absolute: 0.4 10*3/uL (ref 0.1–1.0)
Monocytes Relative: 5 %
Neutro Abs: 6.2 10*3/uL (ref 1.7–7.7)
Neutrophils Relative %: 75 %
Platelets: 196 10*3/uL (ref 150–400)
RBC: 3.98 MIL/uL — ABNORMAL LOW (ref 4.22–5.81)
RDW: 17.2 % — ABNORMAL HIGH (ref 11.5–15.5)
WBC: 8.1 10*3/uL (ref 4.0–10.5)
nRBC: 0 % (ref 0.0–0.2)

## 2020-05-16 LAB — FERRITIN: Ferritin: 45 ng/mL (ref 24–336)

## 2020-05-17 ENCOUNTER — Inpatient Hospital Stay (HOSPITAL_BASED_OUTPATIENT_CLINIC_OR_DEPARTMENT_OTHER): Payer: Medicare Other | Admitting: Oncology

## 2020-05-17 ENCOUNTER — Other Ambulatory Visit: Payer: Self-pay

## 2020-05-17 ENCOUNTER — Inpatient Hospital Stay: Payer: Medicare Other

## 2020-05-17 VITALS — BP 118/69 | HR 79 | Temp 98.4°F | Resp 18 | Wt 262.5 lb

## 2020-05-17 DIAGNOSIS — I129 Hypertensive chronic kidney disease with stage 1 through stage 4 chronic kidney disease, or unspecified chronic kidney disease: Secondary | ICD-10-CM | POA: Diagnosis not present

## 2020-05-17 DIAGNOSIS — D509 Iron deficiency anemia, unspecified: Secondary | ICD-10-CM | POA: Diagnosis not present

## 2020-07-24 ENCOUNTER — Ambulatory Visit (INDEPENDENT_AMBULATORY_CARE_PROVIDER_SITE_OTHER): Payer: Medicare Other | Admitting: Vascular Surgery

## 2020-07-24 ENCOUNTER — Encounter (INDEPENDENT_AMBULATORY_CARE_PROVIDER_SITE_OTHER): Payer: Self-pay | Admitting: Vascular Surgery

## 2020-07-24 ENCOUNTER — Other Ambulatory Visit: Payer: Self-pay

## 2020-07-24 VITALS — BP 130/68 | HR 90 | Resp 16 | Wt 256.6 lb

## 2020-07-24 DIAGNOSIS — I890 Lymphedema, not elsewhere classified: Secondary | ICD-10-CM

## 2020-07-24 DIAGNOSIS — E1122 Type 2 diabetes mellitus with diabetic chronic kidney disease: Secondary | ICD-10-CM | POA: Diagnosis not present

## 2020-07-24 DIAGNOSIS — I10 Essential (primary) hypertension: Secondary | ICD-10-CM | POA: Diagnosis not present

## 2020-07-24 NOTE — Patient Instructions (Signed)

## 2020-07-24 NOTE — Assessment & Plan Note (Signed)
Overall doing well.  Symptoms are much better.  It appears if his medical issues are also under better control.  At this point, going to stretch out his follow-up to 6 months.  He will contact our office with any problems in the interim.

## 2020-07-24 NOTE — Progress Notes (Signed)
MRN : [id number]  [name] is a 74 y.o. (Mar 03, 1946) male who presents with chief complaint of  Chief Complaint  Patient presents with  . Follow-up    2-3 month follow up  .  History of Present Illness: Patient returns today in follow up of his leg swelling and lymphedema.  He still is having a lot of balance issues from his significant neuropathy.  His leg swelling is much better.  He has been diligently wearing his compression stockings.  He has tried to increase his activity.  His right leg is really not appreciably swollen today.  His left leg does have a mild amount of edema but no ulceration or severe edema as he has had in the past.  Current Outpatient Medications  Medication Sig Dispense Refill  . albuterol (VENTOLIN HFA) 108 (90 Base) MCG/ACT inhaler Inhale into the lungs.    Marland Kitchen apixaban (ELIQUIS) 5 MG TABS tablet Take 5 mg by mouth every 12 (twelve) hours.     . carvedilol (COREG) 6.25 MG tablet Take 6.25 mg by mouth 2 (two) times daily with a meal.    . clonazePAM (KLONOPIN) 0.5 MG tablet Take 0.5 mg by mouth 2 (two) times daily as needed.    . DULoxetine (CYMBALTA) 60 MG capsule 120 mg daily.     . furosemide (LASIX) 40 MG tablet Take 20 mg by mouth 2 (two) times daily.     Marland Kitchen glipiZIDE (GLUCOTROL XL) 2.5 MG 24 hr tablet Take 3 tablets (7.5 mg total) by mouth daily with breakfast. 30 tablet 1  . mirtazapine (REMERON) 7.5 MG tablet SMARTSIG:1 Tablet(s) By Mouth Every Evening    . Multiple Vitamin (MULTIVITAMIN) tablet Take 1 tablet by mouth daily.    Marland Kitchen oxyCODONE-acetaminophen (PERCOCET) 10-325 MG tablet Take 1 tablet by mouth every 4 (four) hours as needed for pain.    . pantoprazole (PROTONIX) 40 MG tablet Take 40 mg by mouth 2 (two) times daily.     . potassium chloride (KLOR-CON) 20 MEQ packet Take 20 mEq by mouth daily.     Marland Kitchen rOPINIRole (REQUIP) 1 MG tablet Take 2 tablets (2 mg total) by mouth 2 (two) times daily. 60 tablet 1  . sucralfate (CARAFATE) 1 g  tablet Take 1 g by mouth 3 (three) times daily.     Marland Kitchen triamcinolone cream (KENALOG) 0.1 % Apply 1 application topically 2 (two) times daily.     Marland Kitchen atorvastatin (LIPITOR) 10 MG tablet Take 10 mg by mouth daily. (Patient not taking: Reported on 07/24/2020)     No current facility-administered medications for this visit.    Past Medical History:  Diagnosis Date  . Chronic back pain   . COPD (chronic obstructive pulmonary disease) ([place])   . Depression   . Diabetes mellitus without complication ([place])   . Hyperlipidemia   . Hypertension   . Renal cell carcinoma ([place])   . Sleep apnea     Past Surgical History:  Procedure Laterality Date  . CARPAL TUNNEL RELEASE    . CHOLECYSTECTOMY    . COLONOSCOPY WITH PROPOFOL N/A 07/29/2017   Procedure: COLONOSCOPY WITH PROPOFOL;  Surgeon: Manya Silvas, MD;  Location: [facility] ENDOSCOPY;  Service: Endoscopy;  Laterality: N/A;  . colonoscopys    . ESOPHAGOGASTRODUODENOSCOPY (EGD) WITH PROPOFOL N/A 07/29/2017   Procedure: ESOPHAGOGASTRODUODENOSCOPY (EGD) WITH PROPOFOL;  Surgeon: Manya Silvas, MD;  Location: [facility] ENDOSCOPY;  Service: Endoscopy;  Laterality: N/A;  . FLEXIBLE SIGMOIDOSCOPY    . GASTRIC  BYPASS    . NEPHRECTOMY    . open reduction and internal fixation, right distal radius    . right knee arthroscopy    . UPPER GI ENDOSCOPY       Social History   Tobacco Use  . Smoking status: Former Smoker    Packs/day: 1.00    Years: 30.00    Pack years: 30.00  . Smokeless tobacco: Current User    Types: Chew  Vaping Use  . Vaping Use: Never used  Substance Use Topics  . Alcohol use: No  . Drug use: No      Family History  Problem Relation Age of Onset  . Diabetes Mother   . Cancer Mother   . Heart disease Father      Allergies  Allergen Reactions  . Bupropion     Other reaction(s): Other (See Comments) Mood swings, angry     REVIEW OF SYSTEMS (Negative unless checked) Constitutional: [] ???Weight  loss[] ???Fever[] ???Chills Cardiac:[] ???Chest pain[] ???Atrial Fibrillation[] ???Palpitations [] ???Shortness of breath when laying flat [] ???Shortness of breath with exertion. [] ???Shortness of breath at rest Vascular: [] ???Pain in legs with walking[] ???Pain in legswith standing[] ???Pain in legs when laying flat [] ???Claudication [] ???Pain in feet when laying flat [] ???History of DVT [] ???Phlebitis [x] ???Swelling in legs [x] ???Varicose veins [] ???Non-healing ulcers Pulmonary: [] ???Uses home oxygen [] ???Productive cough[] ???Hemoptysis [] ???Wheeze [] ???COPD [] ???Asthma Neurologic: [] ???Dizziness[] ???Seizures [] ???Blackouts[] ???History of stroke [] ???History of TIA[] ???Aphasia [] ???Temporary Blindness[] ???Weaknessor numbness in arm [] ???Weakness or numbnessin leg Musculoskeletal:[] ???Joint swelling [] ???Joint pain [x] ???Low back pain [] ???History of Knee Replacement [] ???Arthritis [] ???back Surgeries[] ???Spinal Stenosis  Hematologic:[] ???Easy bruising[] ???Easy bleeding [] ???Hypercoagulable state [] ???Anemic Gastrointestinal:[] ???Diarrhea [] ???Vomiting[] ???Gastroesophageal reflux/heartburn[] ???Difficulty swallowing. [] ???Abdominal pain Genitourinary: [] ???Chronic kidney disease [] ???Difficulturination [] ???Anuric[] ???Blood in urine [] ???Frequenturination [] ???Burning with urination[] ???Hematuria Skin: [] ???Rashes [] ???Ulcers [x] ???Wounds Psychological: [x] ???History of anxiety[] ???History of major depression [] ???Memory Difficulties  Physical Examination  BP 130/68 (BP Location: Right Arm)   Pulse 90   Resp 16   Wt 256 lb 9.6 oz (116.4 kg)   BMI 34.80 kg/m  Gen:  WD/WN, NAD Head: [place]/AT, No temporalis wasting. Ear/Nose/Throat: Hearing grossly intact, nares w/o erythema or drainage Eyes: Conjunctiva clear. Sclera non-icteric Neck: Supple.  Trachea midline Pulmonary:  Good air  movement, no use of accessory muscles.  Cardiac: RRR, no JVD Vascular:  Vessel Right Left  Radial Palpable Palpable                   Musculoskeletal: M/S 5/5 throughout.  No deformity or atrophy.  No significant right lower extremity edema, 1+ left lower extremity edema. Neurologic: Sensation grossly intact in extremities.  Symmetrical.  Speech is fluent.  Psychiatric: Judgment intact, Mood & affect appropriate for pt's clinical situation. Dermatologic: No rashes or ulcers noted.  No cellulitis or open wounds.       Labs Recent Results (from the past 2160 hour(s))  Iron and TIBC     Status: None   Collection Time: 05/16/20 11:16 AM  Result Value Ref Range   Iron 72 45 - 182 ug/dL   TIBC 321 250 - 450 ug/dL   Saturation Ratios 23 17.9 - 39.5 %   UIBC 249 ug/dL    Comment: Performed at [facility], [address] [place], [place] [address]  Ferritin     Status: None   Collection Time: 05/16/20 11:16 AM  Result Value Ref Range   Ferritin 45 24 - 336 ng/mL    Comment: Performed at [facility], [address], [place], [place] [address]  CBC with Differential     Status: Abnormal   Collection Time: 05/16/20 11:16 AM  Result Value Ref Range   WBC 8.1 4.0 - 10.5 K/uL   RBC 3.98 (L) 4.22 - 5.81 MIL/uL   Hemoglobin 11.5 (L) 13.0 - 17.0 g/dL   HCT 36.5 (L) 39 - 52 %   MCV 91.7 80.0 - 100.0 fL   MCH 28.9 26.0 - 34.0 pg   MCHC 31.5 30.0 - 36.0 g/dL   RDW 17.2 (H) 11.5 - 15.5 %   Platelets 196 150 - 400 K/uL   nRBC 0.0 0.0 - 0.2 %   Neutrophils Relative % 75 %   Neutro Abs 6.2 1.7 - 7.7 K/uL   Lymphocytes Relative 16 %   Lymphs Abs 1.3 0.7 - 4.0 K/uL   Monocytes Relative 5 %   Monocytes Absolute 0.4 0 - 1 K/uL   Eosinophils Relative 2 %   Eosinophils Absolute 0.2 0 - 0 K/uL   Basophils Relative 1 %   Basophils Absolute 0.0 0 - 0 K/uL   Immature Granulocytes 1 %   Abs Immature Granulocytes 0.05 0.00 - 0.07 K/uL    Comment: Performed at [facility], [address] [place], [place] [address]    Radiology No results found.  Assessment/Plan Essential hypertension blood pressure control important in reducing the progression of atherosclerotic disease. On appropriate oral medications.   Diabetes ([place]) blood glucose control important in reducing the progression of atherosclerotic disease. Also, involved in wound healing. On appropriate medications.  Lymphedema Overall doing well.  Symptoms are much better.  It appears if his medical issues are also under better control.  At this point, going to stretch out his follow-up to 6 months.  He will contact our office with any problems in the interim.    Leotis Pain, MD  07/24/2020 2:40 PM    This note was created with Dragon medical transcription system.  Any errors from dictation are purely unintentional

## 2020-08-22 ENCOUNTER — Other Ambulatory Visit: Payer: Self-pay | Admitting: Student

## 2020-08-22 DIAGNOSIS — K8689 Other specified diseases of pancreas: Secondary | ICD-10-CM

## 2020-08-22 DIAGNOSIS — K529 Noninfective gastroenteritis and colitis, unspecified: Secondary | ICD-10-CM

## 2020-08-22 DIAGNOSIS — R1010 Upper abdominal pain, unspecified: Secondary | ICD-10-CM

## 2020-08-24 ENCOUNTER — Other Ambulatory Visit: Payer: Self-pay

## 2020-08-24 ENCOUNTER — Inpatient Hospital Stay: Payer: Medicare Other | Attending: Oncology

## 2020-08-24 DIAGNOSIS — E1122 Type 2 diabetes mellitus with diabetic chronic kidney disease: Secondary | ICD-10-CM | POA: Insufficient documentation

## 2020-08-24 DIAGNOSIS — N189 Chronic kidney disease, unspecified: Secondary | ICD-10-CM | POA: Diagnosis not present

## 2020-08-24 DIAGNOSIS — D631 Anemia in chronic kidney disease: Secondary | ICD-10-CM | POA: Diagnosis not present

## 2020-08-24 DIAGNOSIS — Z79899 Other long term (current) drug therapy: Secondary | ICD-10-CM | POA: Diagnosis not present

## 2020-08-24 DIAGNOSIS — M549 Dorsalgia, unspecified: Secondary | ICD-10-CM | POA: Diagnosis not present

## 2020-08-24 DIAGNOSIS — G8929 Other chronic pain: Secondary | ICD-10-CM | POA: Diagnosis not present

## 2020-08-24 DIAGNOSIS — I129 Hypertensive chronic kidney disease with stage 1 through stage 4 chronic kidney disease, or unspecified chronic kidney disease: Secondary | ICD-10-CM | POA: Insufficient documentation

## 2020-08-24 DIAGNOSIS — D509 Iron deficiency anemia, unspecified: Secondary | ICD-10-CM

## 2020-08-24 LAB — CBC WITH DIFFERENTIAL/PLATELET
Abs Immature Granulocytes: 0.03 10*3/uL (ref 0.00–0.07)
Basophils Absolute: 0.1 10*3/uL (ref 0.0–0.1)
Basophils Relative: 1 %
Eosinophils Absolute: 0.2 10*3/uL (ref 0.0–0.5)
Eosinophils Relative: 3 %
HCT: 31.2 % — ABNORMAL LOW (ref 39.0–52.0)
Hemoglobin: 10.1 g/dL — ABNORMAL LOW (ref 13.0–17.0)
Immature Granulocytes: 0 %
Lymphocytes Relative: 11 %
Lymphs Abs: 1 10*3/uL (ref 0.7–4.0)
MCH: 31.3 pg (ref 26.0–34.0)
MCHC: 32.4 g/dL (ref 30.0–36.0)
MCV: 96.6 fL (ref 80.0–100.0)
Monocytes Absolute: 0.4 10*3/uL (ref 0.1–1.0)
Monocytes Relative: 4 %
Neutro Abs: 7.6 10*3/uL (ref 1.7–7.7)
Neutrophils Relative %: 81 %
Platelets: 194 10*3/uL (ref 150–400)
RBC: 3.23 MIL/uL — ABNORMAL LOW (ref 4.22–5.81)
RDW: 14.7 % (ref 11.5–15.5)
WBC: 9.3 10*3/uL (ref 4.0–10.5)
nRBC: 0 % (ref 0.0–0.2)

## 2020-08-24 LAB — IRON AND TIBC
Iron: 45 ug/dL (ref 45–182)
Saturation Ratios: 15 % — ABNORMAL LOW (ref 17.9–39.5)
TIBC: 295 ug/dL (ref 250–450)
UIBC: 250 ug/dL

## 2020-08-24 LAB — FERRITIN: Ferritin: 68 ng/mL (ref 24–336)

## 2020-08-25 NOTE — Progress Notes (Signed)
[place]  [phone] [phone]  ID: [name] OB: 1946-07-29  MR#: [id number]  OMV#:[id number]  Patient Care Team: Derinda Late, MD as PCP - General (Family Medicine)   CHIEF COMPLAINT: Anemia in chronic kidney disease, mild iron deficiency.  INTERVAL HISTORY: Patient returns to clinic today for repeat laboratory work, further evaluation, and continuation of treatment.  He has noticed increased weakness and fatigue recently.  He continues to have chronic back pain, but this has been helped by physical therapy.  He otherwise feels well.  He has no neurologic complaints.  He denies any recent fevers or illnesses.  He has a good appetite and denies weight loss.  He denies any chest pain, shortness of breath, cough, or hemoptysis.  He denies any abdominal pain.  He has no nausea, vomiting, constipation, or diarrhea.  He denies any melena or hematochezia.  He has no urinary complaints.  Patient offers no further specific complaints today.   REVIEW OF SYSTEMS:   Review of Systems  Constitutional: Positive for malaise/fatigue. Negative for fever and weight loss.  Respiratory: Negative.  Negative for cough, hemoptysis and shortness of breath.   Cardiovascular: Negative.  Negative for chest pain and leg swelling.  Gastrointestinal: Negative.  Negative for abdominal pain and constipation.  Genitourinary: Negative.  Negative for flank pain and hematuria.  Musculoskeletal: Positive for back pain.  Skin: Negative.  Negative for rash.  Neurological: Positive for weakness. Negative for dizziness, sensory change, focal weakness and headaches.  Psychiatric/Behavioral: Negative.  The patient is not nervous/anxious.     As per HPI. Otherwise, a complete review of systems is negative.  PAST MEDICAL HISTORY: Past Medical History:  Diagnosis Date  . Chronic back pain   . COPD (chronic obstructive pulmonary disease) ([place])   . Depression   .  Diabetes mellitus without complication ([place])   . Hyperlipidemia   . Hypertension   . Renal cell carcinoma ([place])   . Sleep apnea     PAST SURGICAL HISTORY: Past Surgical History:  Procedure Laterality Date  . CARPAL TUNNEL RELEASE    . CHOLECYSTECTOMY    . COLONOSCOPY WITH PROPOFOL N/A 07/29/2017   Procedure: COLONOSCOPY WITH PROPOFOL;  Surgeon: Manya Silvas, MD;  Location: [facility] ENDOSCOPY;  Service: Endoscopy;  Laterality: N/A;  . colonoscopys    . ESOPHAGOGASTRODUODENOSCOPY (EGD) WITH PROPOFOL N/A 07/29/2017   Procedure: ESOPHAGOGASTRODUODENOSCOPY (EGD) WITH PROPOFOL;  Surgeon: Manya Silvas, MD;  Location: [facility] ENDOSCOPY;  Service: Endoscopy;  Laterality: N/A;  . FLEXIBLE SIGMOIDOSCOPY    . GASTRIC BYPASS    . NEPHRECTOMY    . open reduction and internal fixation, right distal radius    . right knee arthroscopy    . UPPER GI ENDOSCOPY      FAMILY HISTORY: Family History  Problem Relation Age of Onset  . Diabetes Mother   . Cancer Mother   . Heart disease Father     ADVANCED DIRECTIVES (Y/N):  N  HEALTH MAINTENANCE: Social History   Tobacco Use  . Smoking status: Former Smoker    Packs/day: 1.00    Years: 30.00    Pack years: 30.00  . Smokeless tobacco: Current User    Types: Chew  Vaping Use  . Vaping Use: Never used  Substance Use Topics  . Alcohol use: No  . Drug use: No     Colonoscopy:  PAP:  Bone density:  Lipid panel:  Allergies  Allergen Reactions  . Bupropion  Other reaction(s): Other (See Comments) Mood swings, angry    Current Outpatient Medications  Medication Sig Dispense Refill  . apixaban (ELIQUIS) 5 MG TABS tablet Take 5 mg by mouth every 12 (twelve) hours.     . carvedilol (COREG) 6.25 MG tablet Take 6.25 mg by mouth 2 (two) times daily with a meal.    . DULoxetine (CYMBALTA) 60 MG capsule 120 mg daily.     . furosemide (LASIX) 40 MG tablet Take 20 mg by mouth 2 (two) times daily.     Marland Kitchen glipiZIDE (GLUCOTROL XL) 2.5 MG 24  hr tablet Take 3 tablets (7.5 mg total) by mouth daily with breakfast. 30 tablet 1  . mirtazapine (REMERON) 7.5 MG tablet SMARTSIG:1 Tablet(s) By Mouth Every Evening    . Multiple Vitamin (MULTIVITAMIN) tablet Take 1 tablet by mouth daily.    Marland Kitchen oxyCODONE-acetaminophen (PERCOCET) 10-325 MG tablet Take 1 tablet by mouth every 4 (four) hours as needed for pain.    . pantoprazole (PROTONIX) 40 MG tablet Take 40 mg by mouth 2 (two) times daily.     . potassium chloride (KLOR-CON) 20 MEQ packet Take 20 mEq by mouth daily.     Marland Kitchen rOPINIRole (REQUIP) 1 MG tablet Take 2 tablets (2 mg total) by mouth 2 (two) times daily. 60 tablet 1  . sucralfate (CARAFATE) 1 g tablet Take 1 g by mouth 3 (three) times daily.     Marland Kitchen triamcinolone cream (KENALOG) 0.1 % Apply 1 application topically 2 (two) times daily.     Marland Kitchen albuterol (VENTOLIN HFA) 108 (90 Base) MCG/ACT inhaler Inhale into the lungs.    Marland Kitchen atorvastatin (LIPITOR) 10 MG tablet Take 10 mg by mouth daily. (Patient not taking: Reported on 07/24/2020)    . clonazePAM (KLONOPIN) 0.5 MG tablet Take 0.5 mg by mouth 2 (two) times daily as needed.     No current facility-administered medications for this visit.   Facility-Administered Medications Ordered in Other Visits  Medication Dose Route Frequency Provider Last Rate Last Admin  . 0.9 %  sodium chloride infusion   Intravenous Continuous Lloyd Huger, MD   Stopped at 08/27/20 1513    OBJECTIVE: Vitals:   08/27/20 1403  BP: 99/69  Pulse: 82  Resp: 20  Temp: 98 F (36.7 C)  SpO2: 96%     Body mass index is 34.57 kg/m.    ECOG FS:0 - Asymptomatic  General: Well-developed, well-nourished, no acute distress. Eyes: Pink conjunctiva, anicteric sclera. HEENT: Normocephalic, moist mucous membranes. Lungs: No audible wheezing or coughing. Heart: Regular rate and rhythm. Abdomen: Soft, nontender, no obvious distention. Musculoskeletal: No edema, cyanosis, or clubbing. Neuro: Alert, answering all  questions appropriately. Cranial nerves grossly intact. Skin: No rashes or petechiae noted. Psych: Normal affect.  LAB RESULTS:  Lab Results  Component Value Date   NA 135 03/26/2020   K 4.0 03/26/2020   CL 110 03/26/2020   CO2 18 (L) 03/26/2020   GLUCOSE 274 (H) 03/26/2020   BUN 44 (H) 03/26/2020   CREATININE 1.67 (H) 03/26/2020   CALCIUM 8.7 (L) 03/26/2020   PROT 7.3 03/25/2020   ALBUMIN 3.7 03/25/2020   AST 13 (L) 03/25/2020   ALT 21 03/25/2020   ALKPHOS 185 (H) 03/25/2020   BILITOT 0.7 03/25/2020   GFRNONAA 41 (L) 03/26/2020   GFRAA 47 (L) 03/26/2020    Lab Results  Component Value Date   WBC 9.3 08/24/2020   NEUTROABS 7.6 08/24/2020   HGB 10.1 (L) 08/24/2020   HCT  31.2 (L) 08/24/2020   MCV 96.6 08/24/2020   PLT 194 08/24/2020   Lab Results  Component Value Date   IRON 45 08/24/2020   TIBC 295 08/24/2020   IRONPCTSAT 15 (L) 08/24/2020   Lab Results  Component Value Date   FERRITIN 68 08/24/2020     STUDIES: No results found.  ASSESSMENT: Anemia in chronic kidney disease, mild iron deficiency.  PLAN:    1. Anemia in chronic kidney disease, mild iron deficiency: Patient's hemoglobin has trended down to 10.1 and he is mildly symptomatic.  Proceed with 510 mg IV Feraheme today.  He does not require second infusion. He does not require Retacrit at this time, but given his chronic renal insufficiency this may be needed in the future if his hemoglobin falls persistently below 10 with normal iron stores.  Return to clinic in 3 months with repeat laboratory work, further evaluation, and continuation of treatment if needed. 2.  Chronic renal insufficiency: Chronic and unchanged.  Appreciate nephrology input.   3.  Vascular insufficiency: Continue follow-up with vascular surgery as scheduled. 4.  Back pain: Chronic and unchanged.  Continue follow-up and treatment per physical therapy.  I spent a total of 30 minutes reviewing chart data, face-to-face evaluation with  the patient, counseling and coordination of care as detailed above.   Patient expressed understanding and was in agreement with this plan. He also understands that He can call clinic at any time with any questions, concerns, or complaints.    Lloyd Huger, MD   08/27/2020 4:04 PM

## 2020-08-27 ENCOUNTER — Inpatient Hospital Stay: Payer: Medicare Other

## 2020-08-27 ENCOUNTER — Other Ambulatory Visit: Payer: Self-pay

## 2020-08-27 ENCOUNTER — Inpatient Hospital Stay (HOSPITAL_BASED_OUTPATIENT_CLINIC_OR_DEPARTMENT_OTHER): Payer: Medicare Other | Admitting: Oncology

## 2020-08-27 ENCOUNTER — Encounter: Payer: Self-pay | Admitting: Oncology

## 2020-08-27 VITALS — BP 99/69 | HR 82 | Temp 98.0°F | Resp 20 | Wt 254.9 lb

## 2020-08-27 DIAGNOSIS — D509 Iron deficiency anemia, unspecified: Secondary | ICD-10-CM | POA: Diagnosis not present

## 2020-08-27 DIAGNOSIS — I129 Hypertensive chronic kidney disease with stage 1 through stage 4 chronic kidney disease, or unspecified chronic kidney disease: Secondary | ICD-10-CM | POA: Diagnosis not present

## 2020-08-27 MED ORDER — SODIUM CHLORIDE 0.9 % IV SOLN
INTRAVENOUS | Status: DC
  Filled 2020-08-27: qty 250

## 2020-08-27 MED ORDER — SODIUM CHLORIDE 0.9 % IV SOLN
510.0000 mg | Freq: Once | INTRAVENOUS | Status: AC
  Administered 2020-08-27: 510 mg via INTRAVENOUS
  Filled 2020-08-27: qty 510

## 2020-08-30 ENCOUNTER — Telehealth: Payer: Self-pay | Admitting: *Deleted

## 2020-08-30 ENCOUNTER — Encounter: Payer: Self-pay | Admitting: Student in an Organized Health Care Education/Training Program

## 2020-08-30 ENCOUNTER — Other Ambulatory Visit: Payer: Self-pay

## 2020-08-30 ENCOUNTER — Ambulatory Visit
Payer: Medicare Other | Attending: Student in an Organized Health Care Education/Training Program | Admitting: Student in an Organized Health Care Education/Training Program

## 2020-08-30 DIAGNOSIS — M48062 Spinal stenosis, lumbar region with neurogenic claudication: Secondary | ICD-10-CM | POA: Diagnosis not present

## 2020-08-30 DIAGNOSIS — M48061 Spinal stenosis, lumbar region without neurogenic claudication: Secondary | ICD-10-CM

## 2020-08-30 DIAGNOSIS — G894 Chronic pain syndrome: Secondary | ICD-10-CM

## 2020-08-30 DIAGNOSIS — M5416 Radiculopathy, lumbar region: Secondary | ICD-10-CM

## 2020-08-30 DIAGNOSIS — M9973 Connective tissue and disc stenosis of intervertebral foramina of lumbar region: Secondary | ICD-10-CM | POA: Diagnosis not present

## 2020-08-30 MED ORDER — OXYCODONE-ACETAMINOPHEN 10-325 MG PO TABS: 1.0000 | ORAL_TABLET | Freq: Three times a day (TID) | ORAL | 0 refills | Status: DC | PRN

## 2020-08-30 NOTE — Progress Notes (Signed)
Patient: [name]  Service Category: E/M  Provider: Gillis Santa, MD  DOB: Dec 24, 1945  DOS: 08/30/2020  Location: Office  MRN: [id number]  Setting: Ambulatory outpatient  Referring Provider: Derinda Late, MD  Type: Established Patient  Specialty: Interventional Pain Management  PCP: Derinda Late, MD  Location: Home  Delivery: TeleHealth     Virtual Encounter - Pain Management PROVIDER NOTE: Information contained herein reflects review and annotations entered in association with encounter. Interpretation of such information and data should be left to medically-trained personnel. Information provided to patient can be located elsewhere in the medical record under "Patient Instructions". Document created using STT-dictation technology, any transcriptional errors that may result from process are unintentional.    Contact & Pharmacy Preferred: [phone] Home: [phone] (home) Mobile: [phone] (mobile) E-mail: No e-mail address on record  [id number]Phone: [phone]: [phone]  Pre-screening  Mr. CMegan Salonoffered "in-person" vs "virtual" encounter. He indicated preferring virtual for this encounter.   Reason COVID-19*  Social distancing based on CDC and AMA recommendations.   I contacted [name] 08/30/2020 via video conference.      I clearly identified myself as BGillis Santa MD. I verified that I was speaking with the correct person using two identifiers (Name: RGerrard Schmitt and date of birth: 112-18-1947.  Consent I sought verbal advanced consent from RVenetia Constablefor virtual visit interactions. I informed Mr. CFerrentinoof possible security and privacy concerns, risks, and limitations associated with providing "not-in-person" medical evaluation and management services. I also informed Mr. CWightof the availability of "in-person"  appointments. Finally, I informed him that there would be a charge for the virtual visit and that he could be  personally, fully or partially, financially responsible for it. Mr. CCromartieexpressed understanding and agreed to proceed.   Historic Elements   Mr. RAudry Schmitt a 74y.o. year old, male patient evaluated today after our last contact on Visit date not found. [name] of Chronic back pain, COPD (chronic obstructive pulmonary disease) ([place], Depression, Diabetes mellitus without complication ([place], Hyperlipidemia, Hypertension, Renal cell carcinoma ([place], and Sleep apnea. He also  has a past surgical history that includes colonoscopys; Upper gi endoscopy; Flexible sigmoidoscopy; Gastric bypass; Nephrectomy; open reduction and internal fixation, right distal radius; Carpal tunnel release; right knee arthroscopy; Esophagogastroduodenoscopy (egd) with propofol (N/A, 07/29/2017); Colonoscopy with propofol (N/A, 07/29/2017); and Cholecystectomy. Mr. CTrimhas a current medication list which includes the following prescription(s): albuterol, apixaban, atorvastatin, carvedilol, clonazepam, duloxetine, furosemide, glipizide, mirtazapine, multivitamin, oxycodone-acetaminophen, pantoprazole, potassium chloride, ropinirole, sucralfate, and triamcinolone cream. He  reports that he has quit smoking. He has a 30.00 pack-year smoking history. His smokeless tobacco use includes chew. He reports that he does not drink alcohol and does not use drugs. [name] allergic to bupropion.   HPI  Today, he is being contacted for medication management.  Virtual visit today for medication management.  Patient's last visit with me was on 04/09/2020.  At that time he was continuing his physical therapy exercises for his spinal stenosis and neurogenic claudication.  His last medication refill of oxycodone was on 06/20/2020.  This has provided him with some pain relief.  His last  fill of this medication as documented below was on 06/20/2020.  He continues with physical therapy however he states that he worked with a new physical therapist I performed  a new exercise regimen as increased his low back pain.  He has also having bilateral leg pain.   Pharmacotherapy Assessment  Analgesic: 06/20/2020  2   04/09/2020  Oxycodone-Acetaminophen 10-325  90.00  30 Bi Lat   [id number]   Nor (2541)   0/0  45.00 MME  Medicare   [place]     Monitoring: [place] PMP: PDMP reviewed during this encounter.       Pharmacotherapy: No side-effects or adverse reactions reported. Compliance: No problems identified. Effectiveness: Clinically acceptable. Plan: Refer to "POC".  UDS: No results found for: SUMMARY  Laboratory Chemistry Profile   Renal Lab Results  Component Value Date   BUN 44 (H) 03/26/2020   CREATININE 1.67 (H) 03/26/2020   GFRAA 47 (L) 03/26/2020   GFRNONAA 41 (L) 03/26/2020     Hepatic Lab Results  Component Value Date   AST 13 (L) 03/25/2020   ALT 21 03/25/2020   ALBUMIN 3.7 03/25/2020   ALKPHOS 185 (H) 03/25/2020   LIPASE 209 02/12/2014     Electrolytes Lab Results  Component Value Date   NA 135 03/26/2020   K 4.0 03/26/2020   CL 110 03/26/2020   CALCIUM 8.7 (L) 03/26/2020   MG 2.2 07/05/2019   PHOS 3.2 01/12/2020     Bone No results found for: VD25OH, [id number], CL1002IG8, VI9656LP9, 25OHVITD1, 25OHVITD2, 25OHVITD3, TESTOFREE, TESTOSTERONE   Inflammation (CRP: Acute Phase) (ESR: Chronic Phase) Lab Results  Component Value Date   LATICACIDVEN 1.1 03/25/2020       Note: Above Lab results reviewed.  Imaging  US Venous Img Lower Unilateral Left (DVT) CLINICAL DATA:  Evaluate for left lower extremity venous thrombus.  EXAM: LEFT LOWER EXTREMITY VENOUS DOPPLER ULTRASOUND  TECHNIQUE: Gray-scale sonography with compression, as well as color and duplex ultrasound, were performed to evaluate the deep venous system(s) from the level of the common femoral  vein through the popliteal and proximal calf veins.  COMPARISON:  01/10/2020 and 01/20/2017  FINDINGS: VENOUS  Normal compressibility of the common femoral, superficial femoral, and popliteal veins. Visualized portions of profunda femoral vein and great saphenous vein unremarkable. No filling defects to suggest DVT on grayscale or color Doppler imaging. Doppler waveforms show normal direction of venous flow, normal respiratory phasicity and response to augmentation.  Occlusive noncompressible thrombus is present over the veins of the lower leg involving the posterior tibial and peroneal veins. This may be acute or chronic as patient had similar findings and February 2018.  Limited views of the contralateral common femoral vein are unremarkable.  OTHER  None.  Limitations: none  IMPRESSION: Occlusive noncompressible thrombus evident in the left lower leg veins involving the posterior tibial and peroneal veins as this may be acute or chronic. Patient had similar findings in 2018. Left common femoral to popliteal veins are normal.  Electronically Signed   By: Elberta Fortis M.D.   On: 03/25/2020 10:40 DG Chest Port 1 View CLINICAL DATA:  Initial evaluation for acute shortness of breath.  EXAM: PORTABLE CHEST 1 VIEW  COMPARISON:  Prior radiograph from 11/22/2019.  FINDINGS: Cardiomegaly. Mediastinal silhouette within normal limits.  Lungs hypoinflated. Parenchymal opacity seen within the mid and lower left lung, concerning for pneumonia. Underlying pulmonary interstitial congestion without overt pulmonary edema. No pleural effusion. No pneumothorax.  No acute osseous finding.  IMPRESSION: Patchy opacity within the mid and lower left lung, concerning for pneumonia.  Electronically Signed   By: Rise Mu M.D.   On: 03/25/2020 06:43  Assessment  The  primary encounter diagnosis was Lumbar radiculopathy. Diagnoses of Bilateral stenosis of lateral  recess of lumbar spine, Neuroforaminal stenosis of lumbar spine, Spinal stenosis, lumbar region, with neurogenic claudication, and Chronic pain syndrome were also pertinent to this visit.  Plan of Care   Mr. Dewan Emond has a current medication list which includes the following long-term medication(s): apixaban, atorvastatin, carvedilol, duloxetine, glipizide, mirtazapine, pantoprazole, ropinirole, and sucralfate.  Pharmacotherapy (Medications Ordered): Meds ordered this encounter  Medications  . oxyCODONE-acetaminophen (PERCOCET) 10-325 MG tablet    Sig: Take 1 tablet by mouth every 8 (eight) hours as needed for pain.    Dispense:  90 tablet    Refill:  0    For chronic pain syndrome.   Follow-up plan:   Return in about 8 weeks (around 10/25/2020) for Medication Management, in person (UDS).   Recent Visits No visits were found meeting these conditions. Showing recent visits within past 90 days and meeting all other requirements Today's Visits Date Type Provider Dept  08/30/20 Telemedicine Gillis Santa, MD [facility]  Showing today's visits and meeting all other requirements Future Appointments No visits were found meeting these conditions. Showing future appointments within next 90 days and meeting all other requirements  I discussed the assessment and treatment plan with the patient. The patient was provided an opportunity to ask questions and all were answered. The patient agreed with the plan and demonstrated an understanding of the instructions.  Patient advised to call back or seek an in-person evaluation if the symptoms or condition worsens.  Duration of encounter:30 minutes.  Note by: Gillis Santa, MD Date: 08/30/2020; Time: 1:29 PM

## 2020-08-30 NOTE — Telephone Encounter (Signed)
Patient lvmail at 10:40 stating he needs a call back today to get his meds refilled/ Dr. Holley Raring last visit was April

## 2020-09-14 ENCOUNTER — Other Ambulatory Visit: Payer: Self-pay | Admitting: Student

## 2020-09-14 ENCOUNTER — Ambulatory Visit
Admission: RE | Admit: 2020-09-14 | Discharge: 2020-09-14 | Disposition: A | Discharge status: Home / Self Care | Payer: Medicare Other | Source: Ambulatory Visit | Attending: Student | Admitting: Student

## 2020-09-14 ENCOUNTER — Other Ambulatory Visit: Payer: Self-pay

## 2020-09-14 DIAGNOSIS — K8689 Other specified diseases of pancreas: Secondary | ICD-10-CM | POA: Diagnosis present

## 2020-09-14 DIAGNOSIS — K529 Noninfective gastroenteritis and colitis, unspecified: Secondary | ICD-10-CM | POA: Insufficient documentation

## 2020-09-14 DIAGNOSIS — R1010 Upper abdominal pain, unspecified: Secondary | ICD-10-CM | POA: Insufficient documentation

## 2020-09-30 ENCOUNTER — Emergency Department: Payer: Medicare Other

## 2020-09-30 ENCOUNTER — Other Ambulatory Visit: Payer: Self-pay

## 2020-09-30 ENCOUNTER — Inpatient Hospital Stay
Admission: EM | Admit: 2020-09-30 | Discharge: 2020-10-12 | DRG: 673 | Disposition: A | Discharge status: Home / Self Care | Payer: Medicare Other | Source: Ambulatory Visit | Attending: Internal Medicine | Admitting: Internal Medicine

## 2020-09-30 DIAGNOSIS — G2581 Restless legs syndrome: Secondary | ICD-10-CM | POA: Diagnosis present

## 2020-09-30 DIAGNOSIS — E872 Acidosis, unspecified: Secondary | ICD-10-CM

## 2020-09-30 DIAGNOSIS — F32A Depression, unspecified: Secondary | ICD-10-CM | POA: Diagnosis present

## 2020-09-30 DIAGNOSIS — E440 Moderate protein-calorie malnutrition: Secondary | ICD-10-CM | POA: Insufficient documentation

## 2020-09-30 DIAGNOSIS — K529 Noninfective gastroenteritis and colitis, unspecified: Secondary | ICD-10-CM | POA: Diagnosis present

## 2020-09-30 DIAGNOSIS — K219 Gastro-esophageal reflux disease without esophagitis: Secondary | ICD-10-CM | POA: Diagnosis present

## 2020-09-30 DIAGNOSIS — N185 Chronic kidney disease, stage 5: Secondary | ICD-10-CM | POA: Diagnosis not present

## 2020-09-30 DIAGNOSIS — J9601 Acute respiratory failure with hypoxia: Secondary | ICD-10-CM | POA: Diagnosis present

## 2020-09-30 DIAGNOSIS — F411 Generalized anxiety disorder: Secondary | ICD-10-CM | POA: Diagnosis present

## 2020-09-30 DIAGNOSIS — N189 Chronic kidney disease, unspecified: Secondary | ICD-10-CM | POA: Diagnosis present

## 2020-09-30 DIAGNOSIS — E785 Hyperlipidemia, unspecified: Secondary | ICD-10-CM | POA: Diagnosis present

## 2020-09-30 DIAGNOSIS — E162 Hypoglycemia, unspecified: Secondary | ICD-10-CM | POA: Diagnosis not present

## 2020-09-30 DIAGNOSIS — K8689 Other specified diseases of pancreas: Secondary | ICD-10-CM

## 2020-09-30 DIAGNOSIS — N179 Acute kidney failure, unspecified: Secondary | ICD-10-CM | POA: Diagnosis present

## 2020-09-30 DIAGNOSIS — E1122 Type 2 diabetes mellitus with diabetic chronic kidney disease: Secondary | ICD-10-CM | POA: Diagnosis present

## 2020-09-30 DIAGNOSIS — K8681 Exocrine pancreatic insufficiency: Secondary | ICD-10-CM | POA: Diagnosis present

## 2020-09-30 DIAGNOSIS — Z7901 Long term (current) use of anticoagulants: Secondary | ICD-10-CM

## 2020-09-30 DIAGNOSIS — Z6835 Body mass index (BMI) 35.0-35.9, adult: Secondary | ICD-10-CM

## 2020-09-30 DIAGNOSIS — R0902 Hypoxemia: Secondary | ICD-10-CM

## 2020-09-30 DIAGNOSIS — Z833 Family history of diabetes mellitus: Secondary | ICD-10-CM

## 2020-09-30 DIAGNOSIS — I10 Essential (primary) hypertension: Secondary | ICD-10-CM | POA: Diagnosis not present

## 2020-09-30 DIAGNOSIS — E8779 Other fluid overload: Secondary | ICD-10-CM | POA: Diagnosis not present

## 2020-09-30 DIAGNOSIS — E875 Hyperkalemia: Secondary | ICD-10-CM | POA: Diagnosis present

## 2020-09-30 DIAGNOSIS — Z85528 Personal history of other malignant neoplasm of kidney: Secondary | ICD-10-CM

## 2020-09-30 DIAGNOSIS — G4733 Obstructive sleep apnea (adult) (pediatric): Secondary | ICD-10-CM | POA: Diagnosis present

## 2020-09-30 DIAGNOSIS — Z9884 Bariatric surgery status: Secondary | ICD-10-CM

## 2020-09-30 DIAGNOSIS — E1129 Type 2 diabetes mellitus with other diabetic kidney complication: Secondary | ICD-10-CM | POA: Diagnosis present

## 2020-09-30 DIAGNOSIS — I120 Hypertensive chronic kidney disease with stage 5 chronic kidney disease or end stage renal disease: Secondary | ICD-10-CM | POA: Diagnosis present

## 2020-09-30 DIAGNOSIS — R509 Fever, unspecified: Secondary | ICD-10-CM

## 2020-09-30 DIAGNOSIS — Z20822 Contact with and (suspected) exposure to covid-19: Secondary | ICD-10-CM | POA: Diagnosis present

## 2020-09-30 DIAGNOSIS — J9811 Atelectasis: Secondary | ICD-10-CM | POA: Diagnosis not present

## 2020-09-30 DIAGNOSIS — Z905 Acquired absence of kidney: Secondary | ICD-10-CM

## 2020-09-30 DIAGNOSIS — Z992 Dependence on renal dialysis: Secondary | ICD-10-CM

## 2020-09-30 DIAGNOSIS — N2581 Secondary hyperparathyroidism of renal origin: Secondary | ICD-10-CM | POA: Diagnosis present

## 2020-09-30 DIAGNOSIS — E11649 Type 2 diabetes mellitus with hypoglycemia without coma: Secondary | ICD-10-CM | POA: Diagnosis present

## 2020-09-30 DIAGNOSIS — Z86718 Personal history of other venous thrombosis and embolism: Secondary | ICD-10-CM

## 2020-09-30 DIAGNOSIS — N183 Chronic kidney disease, stage 3 unspecified: Secondary | ICD-10-CM | POA: Diagnosis not present

## 2020-09-30 DIAGNOSIS — N1832 Chronic kidney disease, stage 3b: Secondary | ICD-10-CM | POA: Diagnosis present

## 2020-09-30 DIAGNOSIS — J449 Chronic obstructive pulmonary disease, unspecified: Secondary | ICD-10-CM | POA: Diagnosis present

## 2020-09-30 DIAGNOSIS — R5381 Other malaise: Secondary | ICD-10-CM | POA: Diagnosis present

## 2020-09-30 DIAGNOSIS — M549 Dorsalgia, unspecified: Secondary | ICD-10-CM | POA: Diagnosis present

## 2020-09-30 DIAGNOSIS — E861 Hypovolemia: Secondary | ICD-10-CM | POA: Diagnosis present

## 2020-09-30 DIAGNOSIS — F17220 Nicotine dependence, chewing tobacco, uncomplicated: Secondary | ICD-10-CM | POA: Diagnosis present

## 2020-09-30 DIAGNOSIS — G8929 Other chronic pain: Secondary | ICD-10-CM | POA: Diagnosis present

## 2020-09-30 DIAGNOSIS — Z79899 Other long term (current) drug therapy: Secondary | ICD-10-CM

## 2020-09-30 DIAGNOSIS — G253 Myoclonus: Secondary | ICD-10-CM | POA: Diagnosis present

## 2020-09-30 DIAGNOSIS — M48062 Spinal stenosis, lumbar region with neurogenic claudication: Secondary | ICD-10-CM | POA: Diagnosis present

## 2020-09-30 DIAGNOSIS — H9190 Unspecified hearing loss, unspecified ear: Secondary | ICD-10-CM | POA: Diagnosis present

## 2020-09-30 DIAGNOSIS — E860 Dehydration: Secondary | ICD-10-CM | POA: Diagnosis present

## 2020-09-30 DIAGNOSIS — Z9049 Acquired absence of other specified parts of digestive tract: Secondary | ICD-10-CM

## 2020-09-30 DIAGNOSIS — Z86711 Personal history of pulmonary embolism: Secondary | ICD-10-CM

## 2020-09-30 DIAGNOSIS — R531 Weakness: Principal | ICD-10-CM

## 2020-09-30 DIAGNOSIS — G9341 Metabolic encephalopathy: Secondary | ICD-10-CM | POA: Diagnosis present

## 2020-09-30 DIAGNOSIS — Z7984 Long term (current) use of oral hypoglycemic drugs: Secondary | ICD-10-CM

## 2020-09-30 DIAGNOSIS — Z8249 Family history of ischemic heart disease and other diseases of the circulatory system: Secondary | ICD-10-CM

## 2020-09-30 DIAGNOSIS — K861 Other chronic pancreatitis: Secondary | ICD-10-CM | POA: Diagnosis present

## 2020-09-30 DIAGNOSIS — D631 Anemia in chronic kidney disease: Secondary | ICD-10-CM | POA: Diagnosis present

## 2020-09-30 DIAGNOSIS — E669 Obesity, unspecified: Secondary | ICD-10-CM | POA: Diagnosis present

## 2020-09-30 DIAGNOSIS — E876 Hypokalemia: Secondary | ICD-10-CM | POA: Diagnosis not present

## 2020-09-30 LAB — CBC
HCT: 30 % — ABNORMAL LOW (ref 39.0–52.0)
Hemoglobin: 9.7 g/dL — ABNORMAL LOW (ref 13.0–17.0)
MCH: 31.4 pg (ref 26.0–34.0)
MCHC: 32.3 g/dL (ref 30.0–36.0)
MCV: 97.1 fL (ref 80.0–100.0)
Platelets: 198 10*3/uL (ref 150–400)
RBC: 3.09 MIL/uL — ABNORMAL LOW (ref 4.22–5.81)
RDW: 15.4 % (ref 11.5–15.5)
WBC: 12 10*3/uL — ABNORMAL HIGH (ref 4.0–10.5)
nRBC: 0 % (ref 0.0–0.2)

## 2020-09-30 LAB — RESPIRATORY PANEL BY RT PCR (FLU A&B, COVID)
Influenza A by PCR: NEGATIVE
Influenza B by PCR: NEGATIVE
SARS Coronavirus 2 by RT PCR: NEGATIVE

## 2020-09-30 LAB — BASIC METABOLIC PANEL
Anion gap: 13 (ref 5–15)
BUN: 112 mg/dL — ABNORMAL HIGH (ref 8–23)
CO2: 12 mmol/L — ABNORMAL LOW (ref 22–32)
Calcium: 6.8 mg/dL — ABNORMAL LOW (ref 8.9–10.3)
Chloride: 116 mmol/L — ABNORMAL HIGH (ref 98–111)
Creatinine, Ser: 6.55 mg/dL — ABNORMAL HIGH (ref 0.61–1.24)
GFR, Estimated: 8 mL/min — ABNORMAL LOW (ref >60)
Glucose, Bld: 66 mg/dL — ABNORMAL LOW (ref 70–99)
Potassium: 5.8 mmol/L — ABNORMAL HIGH (ref 3.5–5.1)
Sodium: 141 mmol/L (ref 135–145)

## 2020-09-30 LAB — GLUCOSE, CAPILLARY
Glucose-Capillary: 146 mg/dL — ABNORMAL HIGH (ref 70–99)
Glucose-Capillary: 195 mg/dL — ABNORMAL HIGH (ref 70–99)

## 2020-09-30 MED ORDER — OXYCODONE-ACETAMINOPHEN 10-325 MG PO TABS: 1.0000 | ORAL_TABLET | Freq: Three times a day (TID) | ORAL | Status: DC | PRN

## 2020-09-30 MED ORDER — SODIUM CHLORIDE 0.9 % IV BOLUS
1000.0000 mL | Freq: Once | INTRAVENOUS | Status: AC
  Administered 2020-09-30: 1000 mL via INTRAVENOUS

## 2020-09-30 MED ORDER — OXYCODONE HCL 5 MG PO TABS: 5.0000 mg | ORAL_TABLET | Freq: Three times a day (TID) | ORAL | Status: DC | PRN

## 2020-09-30 MED ORDER — DEXTROSE 50 % IV SOLN
1.0000 | INTRAVENOUS | Status: AC | PRN
  Administered 2020-10-01: 25 mL via INTRAVENOUS
  Filled 2020-09-30: qty 50

## 2020-09-30 MED ORDER — ONDANSETRON HCL 4 MG PO TABS: 4.0000 mg | ORAL_TABLET | Freq: Four times a day (QID) | ORAL | Status: DC | PRN

## 2020-09-30 MED ORDER — ONDANSETRON HCL 4 MG/2ML IJ SOLN: 4.0000 mg | Freq: Four times a day (QID) | INTRAMUSCULAR | Status: DC | PRN

## 2020-09-30 MED ORDER — SODIUM CHLORIDE 0.9% FLUSH
3.0000 mL | INTRAVENOUS | Status: DC | PRN
  Administered 2020-10-11: 3 mL via INTRAVENOUS

## 2020-09-30 MED ORDER — OXYCODONE-ACETAMINOPHEN 5-325 MG PO TABS
1.0000 | ORAL_TABLET | Freq: Three times a day (TID) | ORAL | Status: DC | PRN
  Administered 2020-10-01: 1 via ORAL
  Filled 2020-09-30: qty 1

## 2020-09-30 MED ORDER — GLUCAGON HCL RDNA (DIAGNOSTIC) 1 MG IJ SOLR
1.0000 mg | Freq: Once | INTRAMUSCULAR | Status: AC
  Administered 2020-09-30: 1 mg via INTRAVENOUS
  Filled 2020-09-30: qty 1

## 2020-09-30 MED ORDER — INSULIN ASPART 100 UNIT/ML ~~LOC~~ SOLN: 0.0000 [IU] | Freq: Every day | SUBCUTANEOUS | Status: DC

## 2020-09-30 MED ORDER — DEXTROSE 10 % IV SOLN: INTRAVENOUS | Status: DC

## 2020-09-30 MED ORDER — SODIUM CHLORIDE 0.9% FLUSH
3.0000 mL | Freq: Two times a day (BID) | INTRAVENOUS | Status: DC
  Administered 2020-10-01 – 2020-10-12 (×21): 3 mL via INTRAVENOUS

## 2020-09-30 MED ORDER — ACETAMINOPHEN 325 MG PO TABS
650.0000 mg | ORAL_TABLET | Freq: Four times a day (QID) | ORAL | Status: DC | PRN
  Administered 2020-10-01 – 2020-10-11 (×9): 650 mg via ORAL
  Filled 2020-09-30 (×10): qty 2

## 2020-09-30 MED ORDER — IOHEXOL 9 MG/ML PO SOLN
500.0000 mL | ORAL | Status: AC
  Administered 2020-09-30 – 2020-10-01 (×2): 500 mL via ORAL

## 2020-09-30 MED ORDER — ONDANSETRON HCL 4 MG/2ML IJ SOLN
4.0000 mg | Freq: Once | INTRAMUSCULAR | Status: AC
  Administered 2020-09-30: 4 mg via INTRAVENOUS
  Filled 2020-09-30: qty 2

## 2020-09-30 MED ORDER — FUROSEMIDE 10 MG/ML IJ SOLN
20.0000 mg | Freq: Once | INTRAMUSCULAR | Status: AC
  Administered 2020-09-30: 20 mg via INTRAVENOUS
  Filled 2020-09-30: qty 4

## 2020-09-30 MED ORDER — SODIUM BICARBONATE 8.4 % IV SOLN
INTRAVENOUS | Status: DC
  Filled 2020-09-30 (×3): qty 850
  Filled 2020-09-30: qty 150
  Filled 2020-09-30: qty 850
  Filled 2020-09-30 (×2): qty 150

## 2020-09-30 MED ORDER — INSULIN ASPART 100 UNIT/ML ~~LOC~~ SOLN
0.0000 [IU] | Freq: Three times a day (TID) | SUBCUTANEOUS | Status: DC
  Administered 2020-10-01: 2 [IU] via SUBCUTANEOUS
  Filled 2020-09-30: qty 1

## 2020-09-30 MED ORDER — SODIUM ZIRCONIUM CYCLOSILICATE 10 G PO PACK
10.0000 g | PACK | Freq: Once | ORAL | Status: AC
  Administered 2020-09-30: 10 g via ORAL
  Filled 2020-09-30: qty 1

## 2020-09-30 MED ORDER — IOHEXOL 9 MG/ML PO SOLN: 500.0000 mL | Freq: Two times a day (BID) | ORAL | Status: DC | PRN

## 2020-09-30 MED ORDER — ACETAMINOPHEN 650 MG RE SUPP: 650.0000 mg | Freq: Four times a day (QID) | RECTAL | Status: DC | PRN

## 2020-09-30 NOTE — ED Notes (Signed)
Pt ate [place] sandwich tray along with graham crackers and peanut butter, CBG rechecked up to 65 after eating.

## 2020-09-30 NOTE — ED Provider Notes (Signed)
[facility] Emergency Department Provider Note   ____________________________________________   First MD Initiated Contact with Patient 09/30/20 2110     (approximate)  I have reviewed the triage vital signs and the nursing notes.   HISTORY  Chief Complaint Weakness and abnormal labs    HPI [name] is a 74 y.o. male with stated past medical history of chronic back pain, COPD, type 2 diabetes, and history of renal cell carcinoma status post nephrectomy who presents for bilateral lower extremity weakness as well as abnormal labs that were drawn today including elevated BUN and creatinine.  Patient states that this leg weakness has been generalized and worsening over the last 1 week.  Patient was also found to be hypoglycemic in our waiting room and p.o. was attempted.  However, patient remained hypoglycemic         Past Medical History:  Diagnosis Date  . Chronic back pain   . COPD (chronic obstructive pulmonary disease) ([place])   . Depression   . Diabetes mellitus without complication ([place])   . Hyperlipidemia   . Hypertension   . Renal cell carcinoma ([place])   . Sleep apnea     Patient Active Problem List   Diagnosis Date Noted  . History of renal cell carcinoma 09/30/2020  . Hand numbness 04/28/2020  . CAP (community acquired pneumonia) 03/25/2020  . Acute on chronic respiratory failure with hypoxia ([place]) 03/25/2020  . Hyperlipidemia   . COPD (chronic obstructive pulmonary disease) ([place])   . Depression   . CKD (chronic kidney disease), stage IIIa   . Cellulitis 01/10/2020  . Proteinuria 09/20/2019  . Hyperkalemia 07/05/2019  . Lumbar degenerative disc disease 05/11/2019  . Spinal stenosis, lumbar region, with neurogenic claudication 05/11/2019  . Ankylosis of lumbar spine 05/11/2019  . Neuroforaminal stenosis of lumbar spine (left, L5/S1) 05/11/2019  . Lumbar facet arthropathy 05/11/2019  . Chronic pain syndrome 05/11/2019  .  Chronic obstructive pulmonary disease ([place]) 01/03/2019  . Atherosclerotic peripheral vascular disease ([place]) 09/01/2018  . Lymphedema 06/22/2018  . Iron deficiency anemia 05/01/2018  . Anemia in chronic kidney disease 04/26/2018  . Varicose veins of leg with swelling, bilateral 04/09/2018  . SOB (shortness of breath) on exertion 03/17/2018  . Bilateral lower extremity edema 02/23/2018  . Lower extremity pain, bilateral 02/23/2018  . Essential hypertension 02/23/2018  . H/O deep venous thrombosis 01/23/2017  . Type II diabetes mellitus with renal manifestations ([place]) 06/19/2014  . Chronic kidney disease, unspecified 06/19/2014  . Anxiety state 06/19/2014  . Other and unspecified hyperlipidemia 06/19/2014    Past Surgical History:  Procedure Laterality Date  . CARPAL TUNNEL RELEASE    . CHOLECYSTECTOMY    . COLONOSCOPY WITH PROPOFOL N/A 07/29/2017   Procedure: COLONOSCOPY WITH PROPOFOL;  Surgeon: Manya Silvas, MD;  Location: [facility] ENDOSCOPY;  Service: Endoscopy;  Laterality: N/A;  . colonoscopys    . ESOPHAGOGASTRODUODENOSCOPY (EGD) WITH PROPOFOL N/A 07/29/2017   Procedure: ESOPHAGOGASTRODUODENOSCOPY (EGD) WITH PROPOFOL;  Surgeon: Manya Silvas, MD;  Location: [facility] ENDOSCOPY;  Service: Endoscopy;  Laterality: N/A;  . FLEXIBLE SIGMOIDOSCOPY    . GASTRIC BYPASS    . NEPHRECTOMY    . open reduction and internal fixation, right distal radius    . right knee arthroscopy    . UPPER GI ENDOSCOPY      Prior to Admission medications   Medication Sig Start Date End Date Taking? Authorizing Provider  albuterol (VENTOLIN HFA) 108 (90 Base) MCG/ACT inhaler Inhale 2 puffs  into the lungs every 6 (six) hours as needed for wheezing.  05/09/20   [provider]  apixaban (ELIQUIS) 5 MG TABS tablet Take 5 mg by mouth every 12 (twelve) hours.     [provider]  carvedilol (COREG) 6.25 MG tablet Take 6.25 mg by mouth 2 (two) times daily with a meal.    [provider]    clonazePAM (KLONOPIN) 0.5 MG tablet Take 0.5 mg by mouth 2 (two) times daily as needed. 06/25/20   [provider]  DULoxetine (CYMBALTA) 60 MG capsule Take 120 mg by mouth daily.  09/01/19   [provider]  furosemide (LASIX) 40 MG tablet Take 40 mg by mouth 2 (two) times daily.     [provider]  gabapentin (NEURONTIN) 300 MG capsule Take 300 mg by mouth 3 (three) times daily. 09/25/20   [provider]  glipiZIDE (GLUCOTROL XL) 10 MG 24 hr tablet Take 10 mg by mouth daily. 07/25/20   [provider]  KLOR-CON M20 20 MEQ tablet Take 20 mEq by mouth daily. 08/04/20   [provider]  mirtazapine (REMERON) 45 MG tablet Take 45 mg by mouth at bedtime.  07/05/20   [provider]  Multiple Vitamin (MULTIVITAMIN) tablet Take 1 tablet by mouth daily.    [provider]  oxyCODONE-acetaminophen (PERCOCET) 10-325 MG tablet Take 1 tablet by mouth every 8 (eight) hours as needed for severe pain.    [provider]  pantoprazole (PROTONIX) 40 MG tablet Take 40 mg by mouth 2 (two) times daily.     [provider]  rOPINIRole (REQUIP) 1 MG tablet Take 2 tablets (2 mg total) by mouth 2 (two) times daily. 03/27/20   Fritzi Mandes, MD  sucralfate (CARAFATE) 1 g tablet Take 1 g by mouth 3 (three) times daily.     [provider]  triamcinolone cream (KENALOG) 0.1 % Apply 1 application topically 2 (two) times daily.     [provider]    Allergies Bupropion  Family History  Problem Relation Age of Onset  . Diabetes Mother   . Cancer Mother   . Heart disease Father     Social History Social History   Tobacco Use  . Smoking status: Former Smoker    Packs/day: 1.00    Years: 30.00    Pack years: 30.00  . Smokeless tobacco: Current User    Types: Chew  Vaping Use  . Vaping Use: Never used  Substance Use Topics  . Alcohol use: No  . Drug use: No    Review of Systems Constitutional: No  fever/chills Eyes: No visual changes. ENT: No sore throat. Cardiovascular: Denies chest pain. Respiratory: Denies shortness of breath. Gastrointestinal: No abdominal pain.  No nausea, no vomiting.  No diarrhea. Genitourinary: Negative for dysuria. Musculoskeletal: Negative for acute arthralgias Skin: Negative for rash. Neurological: Negative for headaches, numbness/paresthesias in any extremity Psychiatric: Negative for suicidal ideation/homicidal ideation   ____________________________________________   PHYSICAL EXAM:  VITAL SIGNS: ED Triage Vitals  Enc Vitals Group     BP 09/30/20 1655 113/60     Pulse Rate 09/30/20 1655 88     Resp 09/30/20 1655 18     Temp 09/30/20 1701 97.9 F (36.6 C)     Temp Source 09/30/20 1701 Oral     SpO2 09/30/20 1655 97 %     Weight 09/30/20 1658 250 lb (113.4 kg)     Height 09/30/20 1658 6' (1.829 m)  Head Circumference --      Peak Flow --      Pain Score 09/30/20 1658 0     Pain Loc --      Pain Edu? --      Excl. in [place]? --    Constitutional: Alert and oriented. Well appearing and in no acute distress. Eyes: Conjunctivae are normal. PERRL. Head: Atraumatic. Nose: No congestion/rhinnorhea. Mouth/Throat: Mucous membranes are moist. Neck: No stridor Cardiovascular: Grossly normal heart sounds.  Good peripheral circulation. Respiratory: Normal respiratory effort.  No retractions. Gastrointestinal: Soft and nontender. No distention. Musculoskeletal: No obvious deformities Neurologic:  Normal speech and language. No gross focal neurologic deficits are appreciated. Skin:  Skin is warm and dry. No rash noted.  1+ pitting bilateral lower extremity edema Psychiatric: Mood and affect are normal. Speech and behavior are normal.  ____________________________________________   LABS (all labs ordered are listed, but only abnormal results are displayed)  Labs Reviewed  BASIC METABOLIC PANEL - Abnormal; Notable for the following components:       Result Value   Potassium 5.8 (*)    Chloride 116 (*)    CO2 12 (*)    Glucose, Bld 66 (*)    BUN 112 (*)    Creatinine, Ser 6.55 (*)    Calcium 6.8 (*)    GFR, Estimated 8 (*)    All other components within normal limits  CBC - Abnormal; Notable for the following components:   WBC 12.0 (*)    RBC 3.09 (*)    Hemoglobin 9.7 (*)    HCT 30.0 (*)    All other components within normal limits  GLUCOSE, CAPILLARY - Abnormal; Notable for the following components:   Glucose-Capillary 146 (*)    All other components within normal limits  RESPIRATORY PANEL BY RT PCR (FLU A&B, COVID)  URINALYSIS, COMPLETE (UACMP) WITH MICROSCOPIC  CBG MONITORING, ED  CBG MONITORING, ED   ____________________________________________  EKG  ED ECG REPORT I, Naaman Plummer, the attending physician, personally viewed and interpreted this ECG.  Date: 09/30/2020 EKG Time: 1948 Rate: 89 Rhythm: normal sinus rhythm QRS Axis: normal Intervals: Right bundle branch block ST/T Wave abnormalities: normal Narrative Interpretation: Right bundle branch block, no evidence of acute ischemia  ____________________________________________  RADIOLOGY  ED MD interpretation: Single view x-ray of the chest shows no evidence of acute abnormalities including no evidence of pneumothorax, pneumonia, or widened mediastinum  Official radiology report(s): DG Chest Port 1 View  Result Date: 09/30/2020 CLINICAL DATA:  74 year old male with cough. Lower extremity weakness. Former smoker. EXAM: PORTABLE CHEST 1 VIEW COMPARISON:  Portable chest 03/25/2020 and earlier. FINDINGS: Portable AP semi upright view at 2148 hours. Stable lung volumes and mediastinal contours. Cardiac size within normal limits. Visualized tracheal air column is within normal limits. Resolved patchy left lung opacity since April. Underlying increased pulmonary interstitium not significantly changed. No pneumothorax, pulmonary edema, pleural effusion or  new pulmonary opacity. There is a new electronic device projecting over the right cardiophrenic angle of unclear significance. No acute osseous abnormality identified. IMPRESSION: No acute cardiopulmonary abnormality. Electronically Signed   By: Genevie Ann M.D.   On: 09/30/2020 21:57    ____________________________________________   PROCEDURES  Procedure(s) performed (including Critical Care):  Procedures   ____________________________________________   INITIAL IMPRESSION / ASSESSMENT AND PLAN / ED COURSE  As part of my medical decision making, I reviewed the following data within the [facility] notes reviewed and incorporated, Labs reviewed, EKG  interpreted, Old chart reviewed, Radiograph reviewed and Notes from prior ED visits reviewed and incorporated        Patient is a 74 year old male who presents for worsening weakness in the setting of worsening renal function on labs drawn at his PCP today.  Patient was also found to be persistently hypoglycemic in our waiting room and was immediately brought back to the room.  Patient was given Zofran/glucagon, p.o., and started on a D10 drip at 125.  Patient's creatinine has significantly increased over the past 6 months.  Given the significance metabolic abnormalities, patient will require admission to the internal medicine service for further evaluation and management.  I spoke to Dr. Damita Dunnings and internal medicine who requested patient have a CT noncontrast of the abdomen and pelvis to rule out acute obstruction prior to her acceptance of this patient.       ____________________________________________   FINAL CLINICAL IMPRESSION(S) / ED DIAGNOSES  Final diagnoses:  Generalized weakness  Acute renal failure, unspecified acute renal failure type ([place])  Hyperkalemia  Hypoglycemia     ED Discharge Orders    None       Note:  This document was prepared using Dragon voice recognition software and may include  unintentional dictation errors.   Naaman Plummer, MD 09/30/20 2253

## 2020-09-30 NOTE — ED Triage Notes (Signed)
Pt to ED POV with chief complaint of leg weakness and abnormal labs drawn today. Decreased calcium, elevated BUN and creatinine.  States weakness in legs started a couple days ago.

## 2020-09-30 NOTE — ED Notes (Signed)
Pt's glucose from [facility] labs was 65, CBG obtained at this time was 47, graham crackers and 4oz of orange juice given.

## 2020-09-30 NOTE — H&P (Signed)
History and Physical    [name] UVO:[id number] DOB: 12/17/1945 DOA: 09/30/2020  PCP: Derinda Late, MD   Patient coming from:   I have personally briefly reviewed patient's old medical records in [place]  Chief Complaint: Generalized weakness, abnormal labs  HPI: [name] is a 74 y.o. male with medical history significant for DM, HTN, GERD, renal cell carcinoma status post left nephrectomy 2004, history of DVT on Eliquis, depression, COPD, chronic back pain, CKD stage IIIa, anemia of CKD, recently diagnosed with pancreatic insufficiency after work-up for several weeks of  diarrhea, indigestion and bloating with plans to start on Creon who presents to the emergency room on referral from his PCP for abnormal blood work i related to his renal function after initially presenting with weakness in his lower extremities, stating his legs were feeling like spaghetti..  Patient denies abdominal pain and dysuria.  Has not yet started taking Creon.  Loose stool has not recently worsened.  Denies fever or chills.  Denies cough or shortness of breath and denies chest pain ED Course: On arrival, patient was afebrile, BP 127/56, pulse 93 O2 sat 97% on room air.  Blood work remarkable for creatinine of 6.55, up from 1.676 months prior with potassium of 5.8 and serum bicarb of 12.  Blood glucose 66.  Hemoglobin 9.7.  EKG as reviewed by me : Normal sinus rhythm with no acute ST-T wave changes Patient was treated with D50 and was eventually placed on D10 in the ER due to persistent hypoglycemia.  CT abdomen was ordered to evaluate for obstruction.  Results still pending.  Urinalysis still pending.  Hospitalist consulted for admission. Review of Systems: As per HPI otherwise all other systems on review of systems negative.    Past Medical History:  Diagnosis Date  . Chronic back pain   . COPD (chronic obstructive pulmonary disease) ([place])   . Depression   . Diabetes  mellitus without complication ([place])   . Hyperlipidemia   . Hypertension   . Renal cell carcinoma ([place])   . Sleep apnea     Past Surgical History:  Procedure Laterality Date  . CARPAL TUNNEL RELEASE    . CHOLECYSTECTOMY    . COLONOSCOPY WITH PROPOFOL N/A 07/29/2017   Procedure: COLONOSCOPY WITH PROPOFOL;  Surgeon: Manya Silvas, MD;  Location: [facility] ENDOSCOPY;  Service: Endoscopy;  Laterality: N/A;  . colonoscopys    . ESOPHAGOGASTRODUODENOSCOPY (EGD) WITH PROPOFOL N/A 07/29/2017   Procedure: ESOPHAGOGASTRODUODENOSCOPY (EGD) WITH PROPOFOL;  Surgeon: Manya Silvas, MD;  Location: [facility]COPY;  Service: Endoscopy;  Laterality: N/A;  . FLEXIBLE SIGMOIDOSCOPY    . GASTRIC BYPASS    . NEPHRECTOMY    . open reduction and internal fixation, right distal radius    . right knee arthroscopy    . UPPER GI ENDOSCOPY       reports that he has quit smoking. He has a 30.00 pack-year smoking history. His smokeless tobacco use includes chew. He reports that he does not drink alcohol and does not use drugs.  Allergies  Allergen Reactions  . Bupropion     Other reaction(s): Other (See Comments) Mood swings, angry    Family History  Problem Relation Age of Onset  . Diabetes Mother   . Cancer Mother   . Heart disease Father       Prior to Admission medications   Medication Sig Start Date End Date Taking? Authorizing Provider  albuterol (VENTOLIN HFA) 108 (90 Base) MCG/ACT  inhaler Inhale 2 puffs into the lungs every 6 (six) hours as needed for wheezing.  05/09/20   [provider]  apixaban (ELIQUIS) 5 MG TABS tablet Take 5 mg by mouth every 12 (twelve) hours.     [provider]  carvedilol (COREG) 6.25 MG tablet Take 6.25 mg by mouth 2 (two) times daily with a meal.    [provider]  clonazePAM (KLONOPIN) 0.5 MG tablet Take 0.5 mg by mouth 2 (two) times daily as needed. 06/25/20   [provider]  DULoxetine (CYMBALTA) 60 MG capsule Take 120 mg by  mouth daily.  09/01/19   [provider]  furosemide (LASIX) 40 MG tablet Take 40 mg by mouth 2 (two) times daily.     [provider]  gabapentin (NEURONTIN) 300 MG capsule Take 300 mg by mouth 3 (three) times daily. 09/25/20   [provider]  glipiZIDE (GLUCOTROL XL) 10 MG 24 hr tablet Take 10 mg by mouth daily. 07/25/20   [provider]  KLOR-CON M20 20 MEQ tablet Take 20 mEq by mouth daily. 08/04/20   [provider]  mirtazapine (REMERON) 45 MG tablet Take 45 mg by mouth at bedtime.  07/05/20   [provider]  Multiple Vitamin (MULTIVITAMIN) tablet Take 1 tablet by mouth daily.    [provider]  oxyCODONE-acetaminophen (PERCOCET) 10-325 MG tablet Take 1 tablet by mouth every 8 (eight) hours as needed for severe pain.    [provider]  pantoprazole (PROTONIX) 40 MG tablet Take 40 mg by mouth 2 (two) times daily.     [provider]  rOPINIRole (REQUIP) 1 MG tablet Take 2 tablets (2 mg total) by mouth 2 (two) times daily. 03/27/20   Fritzi Mandes, MD  sucralfate (CARAFATE) 1 g tablet Take 1 g by mouth 3 (three) times daily.     [provider]  triamcinolone cream (KENALOG) 0.1 % Apply 1 application topically 2 (two) times daily.     [provider]    Physical Exam: Vitals:   09/30/20 1658 09/30/20 1701 09/30/20 1936 09/30/20 2200  BP:   (!) 127/56 (!) 137/43  Pulse:   93 89  Resp:   20 17  Temp:  97.9 F (36.6 C)    TempSrc:  Oral    SpO2:   97% 95%  Weight: 113.4 kg     Height: 6' (1.829 m)        Vitals:   09/30/20 1658 09/30/20 1701 09/30/20 1936 09/30/20 2200  BP:   (!) 127/56 (!) 137/43  Pulse:   93 89  Resp:   20 17  Temp:  97.9 F (36.6 C)    TempSrc:  Oral    SpO2:   97% 95%  Weight: 113.4 kg     Height: 6' (1.829 m)         Constitutional: Alert and oriented x 3 . Not in any apparent distress HEENT:      Head: Normocephalic and atraumatic.         Eyes:  PERLA, EOMI, Conjunctivae are normal. Sclera is non-icteric.       Mouth/Throat: Mucous membranes are moist.       Neck: Supple with no signs of meningismus. Cardiovascular: Regular rate and rhythm. No murmurs, gallops, or rubs. 2+ symmetrical distal pulses are present . No JVD. No LE edema Respiratory: Respiratory effort normal .Lungs sounds clear bilaterally. No wheezes, crackles, or rhonchi.  Gastrointestinal: Soft, non tender, and non  distended with positive bowel sounds. No rebound or guarding. Genitourinary: No CVA tenderness. Musculoskeletal: Nontender with normal range of motion in all extremities. No cyanosis, or erythema of extremities. Neurologic:  Face is symmetric. Moving all extremities. No gross focal neurologic deficits . Skin: Skin is warm, dry.  No rash or ulcers Psychiatric: Mood and affect are normal    Labs on Admission: I have personally reviewed following labs and imaging studies  CBC: Recent Labs  Lab 09/30/20 1953  WBC 12.0*  HGB 9.7*  HCT 30.0*  MCV 97.1  PLT 409   Basic Metabolic Panel: Recent Labs  Lab 09/30/20 1953  NA 141  K 5.8*  CL 116*  CO2 12*  GLUCOSE 66*  BUN 112*  CREATININE 6.55*  CALCIUM 6.8*   GFR: Estimated Creatinine Clearance: 13.4 mL/min (A) (by C-G formula based on SCr of 6.55 mg/dL (H)). Liver Function Tests: No results for input(s): AST, ALT, ALKPHOS, BILITOT, PROT, ALBUMIN in the last 168 hours. No results for input(s): LIPASE, AMYLASE in the last 168 hours. No results for input(s): AMMONIA in the last 168 hours. Coagulation Profile: No results for input(s): INR, PROTIME in the last 168 hours. Cardiac Enzymes: No results for input(s): CKTOTAL, CKMB, CKMBINDEX, TROPONINI in the last 168 hours. BNP (last 3 results) No results for input(s): PROBNP in the last 8760 hours. HbA1C: No results for input(s): HGBA1C in the last 72 hours. CBG: Recent Labs  Lab 09/30/20 2143 09/30/20 2305  GLUCAP 146* 195*   Lipid  Profile: No results for input(s): CHOL, HDL, LDLCALC, TRIG, CHOLHDL, LDLDIRECT in the last 72 hours. Thyroid Function Tests: No results for input(s): TSH, T4TOTAL, FREET4, T3FREE, THYROIDAB in the last 72 hours. Anemia Panel: No results for input(s): VITAMINB12, FOLATE, FERRITIN, TIBC, IRON, RETICCTPCT in the last 72 hours. Urine analysis:    Component Value Date/Time   COLORURINE STRAW (A) 01/24/2019 1250   APPEARANCEUR CLEAR (A) 01/24/2019 1250   APPEARANCEUR Clear 06/22/2014 2019   LABSPEC 1.010 01/24/2019 1250   LABSPEC 1.015 06/22/2014 2019   PHURINE 5.0 01/24/2019 1250   GLUCOSEU NEGATIVE 01/24/2019 1250   GLUCOSEU Negative 06/22/2014 2019   HGBUR SMALL (A) 01/24/2019 1250   BILIRUBINUR NEGATIVE 01/24/2019 1250   BILIRUBINUR Negative 06/22/2014 2019   KETONESUR NEGATIVE 01/24/2019 1250   PROTEINUR 30 (A) 01/24/2019 1250   NITRITE NEGATIVE 01/24/2019 1250   LEUKOCYTESUR NEGATIVE 01/24/2019 1250   LEUKOCYTESUR Negative 06/22/2014 2019    Radiological Exams on Admission: DG Chest Port 1 View  Result Date: 09/30/2020 CLINICAL DATA:  74 year old male with cough. Lower extremity weakness. Former smoker. EXAM: PORTABLE CHEST 1 VIEW COMPARISON:  Portable chest 03/25/2020 and earlier. FINDINGS: Portable AP semi upright view at 2148 hours. Stable lung volumes and mediastinal contours. Cardiac size within normal limits. Visualized tracheal air column is within normal limits. Resolved patchy left lung opacity since April. Underlying increased pulmonary interstitium not significantly changed. No pneumothorax, pulmonary edema, pleural effusion or new pulmonary opacity. There is a new electronic device projecting over the right cardiophrenic angle of unclear significance. No acute osseous abnormality identified. IMPRESSION: No acute cardiopulmonary abnormality. Electronically Signed   By: Genevie Ann M.D.   On: 09/30/2020 21:57     Assessment/Plan 74 year old male with history of DM, HTN,  renal cell carcinoma status post left nephrectomy 2004, history of DVT on Eliquis, depression, COPD, chronic back pain, CKD stage IIIa, anemia of CKD, recently diagnosed with pancreatic insufficiency after work-up for several weeks of  diarrhea, indigestion  and bloating with plans to start on Creon presenting with weakness and abnormal blood work   AKI (acute kidney injury) ([place])   Metabolic acidosis   Hyperkalemia   CKD (chronic kidney disease), stage IIIa   History of renal cell carcinoma   History of left nephrectomy 2004 -Creatinine 6.55 above baseline of 1.6, bicarb 12, potassium 5.8 -Acute kidney injury likely ATN related to dehydration from several week history of chronic diarrhea, poor oral intake related to pancreatic insufficiency -IV hydration with bicarb with D5 -Follow-up CT abdomen to rule out obstruction.  Had MRI on 10/1 that showed no hydronephrosis -Potassium 5.8.  Continue to monitor -Nephrology consult -Renally dose all meds    Hypoglycemia, and type 2 diabetes -Likely related to decreased oral intake from GI issues as well as oral hypoglycemic/Glucotrol -Received D50 and was placed on D10 in the emergency room -Blood sugars improving so we will continue with D5 with bicarb as above -Continue to monitor blood sugars and hold all home meds for now    Pancreatic insufficiency   Chronic diarrhea -Patient was started on Creon 2 capsules 3 times daily with meals but has not yet filled the prescription -Can consider initiating medication while in-house    Essential hypertension -Continue carvedilol    Anemia in chronic kidney disease -Anemia at baseline.  Continue to monitor  Chronic back pain -Avoid NSAIDs -Continue home Percocet    COPD (chronic obstructive pulmonary disease) ([facility]) -Not acutely exacerbated.  Continue home inhalers with as needed duo nebs  History of DVT -Pharmacy consult for Eliquis dosing in view of elevated creatinine of  6.55  Depression -Continue duloxetine    DVT prophylaxis: Eliquis Code Status: full code  Family Communication:  none  Disposition Plan: Back to previous home environment Consults called: Nephrology Status:At the time of admission, it appears that the appropriate admission status for this patient is INPATIENT. This is judged to be reasonable and necessary in order to provide the required intensity of service to ensure the patient's safety given the presenting symptoms, physical exam findings, and initial radiographic and laboratory data in the context of their  Comorbid conditions.   Patient requires inpatient status due to high intensity of service, high risk for further deterioration and high frequency of surveillance required.   I certify that at the point of admission it is my clinical judgment that the patient will require inpatient hospital care spanning beyond [place] MD [facility]     09/30/2020, 11:22 PM

## 2020-09-30 NOTE — ED Notes (Signed)
Pt has had labs drawn previously today at pcp.

## 2020-09-30 NOTE — ED Notes (Signed)
Assumed care of pt upon being roomed, pt c/o chronic back pain and weakness. RN spoke with wife while with pt. Denies needs or concerns at this time. AO x4, call bell with reach. Side rail up x1

## 2020-09-30 NOTE — ED Notes (Addendum)
Attempted IV access x 2 unsuccessful. Able to obtain blood.  IV team consult placed.

## 2020-10-01 ENCOUNTER — Encounter: Payer: Self-pay | Admitting: Internal Medicine

## 2020-10-01 ENCOUNTER — Inpatient Hospital Stay: Payer: Medicare Other

## 2020-10-01 DIAGNOSIS — N179 Acute kidney failure, unspecified: Secondary | ICD-10-CM | POA: Diagnosis not present

## 2020-10-01 DIAGNOSIS — I10 Essential (primary) hypertension: Secondary | ICD-10-CM | POA: Diagnosis not present

## 2020-10-01 DIAGNOSIS — Z905 Acquired absence of kidney: Secondary | ICD-10-CM

## 2020-10-01 DIAGNOSIS — K8689 Other specified diseases of pancreas: Secondary | ICD-10-CM

## 2020-10-01 DIAGNOSIS — D631 Anemia in chronic kidney disease: Secondary | ICD-10-CM

## 2020-10-01 DIAGNOSIS — N1832 Chronic kidney disease, stage 3b: Secondary | ICD-10-CM

## 2020-10-01 DIAGNOSIS — N183 Chronic kidney disease, stage 3 unspecified: Secondary | ICD-10-CM

## 2020-10-01 DIAGNOSIS — E1122 Type 2 diabetes mellitus with diabetic chronic kidney disease: Secondary | ICD-10-CM | POA: Diagnosis not present

## 2020-10-01 DIAGNOSIS — E872 Acidosis: Secondary | ICD-10-CM

## 2020-10-01 LAB — PHOSPHORUS: Phosphorus: 10.4 mg/dL — ABNORMAL HIGH (ref 2.5–4.6)

## 2020-10-01 LAB — BASIC METABOLIC PANEL
Anion gap: 10 (ref 5–15)
BUN: 108 mg/dL — ABNORMAL HIGH (ref 8–23)
CO2: 16 mmol/L — ABNORMAL LOW (ref 22–32)
Calcium: 6.5 mg/dL — ABNORMAL LOW (ref 8.9–10.3)
Chloride: 114 mmol/L — ABNORMAL HIGH (ref 98–111)
Creatinine, Ser: 6.69 mg/dL — ABNORMAL HIGH (ref 0.61–1.24)
GFR, Estimated: 8 mL/min — ABNORMAL LOW (ref >60)
Glucose, Bld: 198 mg/dL — ABNORMAL HIGH (ref 70–99)
Potassium: 5.2 mmol/L — ABNORMAL HIGH (ref 3.5–5.1)
Sodium: 140 mmol/L (ref 135–145)

## 2020-10-01 LAB — URINALYSIS, COMPLETE (UACMP) WITH MICROSCOPIC
Bilirubin Urine: NEGATIVE
Glucose, UA: NEGATIVE mg/dL
Ketones, ur: NEGATIVE mg/dL
Leukocytes,Ua: NEGATIVE
Nitrite: NEGATIVE
Protein, ur: 30 mg/dL — AB
Specific Gravity, Urine: 1.008 (ref 1.005–1.030)
Squamous Epithelial / HPF: NONE SEEN (ref 0–5)
pH: 5 (ref 5.0–8.0)

## 2020-10-01 LAB — CBC
HCT: 26.1 % — ABNORMAL LOW (ref 39.0–52.0)
Hemoglobin: 8.4 g/dL — ABNORMAL LOW (ref 13.0–17.0)
MCH: 31.3 pg (ref 26.0–34.0)
MCHC: 32.2 g/dL (ref 30.0–36.0)
MCV: 97.4 fL (ref 80.0–100.0)
Platelets: 220 10*3/uL (ref 150–400)
RBC: 2.68 MIL/uL — ABNORMAL LOW (ref 4.22–5.81)
RDW: 15.3 % (ref 11.5–15.5)
WBC: 13.6 10*3/uL — ABNORMAL HIGH (ref 4.0–10.5)
nRBC: 0 % (ref 0.0–0.2)

## 2020-10-01 LAB — GLUCOSE, CAPILLARY
Glucose-Capillary: 171 mg/dL — ABNORMAL HIGH (ref 70–99)
Glucose-Capillary: 122 mg/dL — ABNORMAL HIGH (ref 70–99)
Glucose-Capillary: 91 mg/dL (ref 70–99)
Glucose-Capillary: 60 mg/dL — ABNORMAL LOW (ref 70–99)
Glucose-Capillary: 71 mg/dL (ref 70–99)
Glucose-Capillary: 81 mg/dL (ref 70–99)
Glucose-Capillary: 61 mg/dL — ABNORMAL LOW (ref 70–99)
Glucose-Capillary: 77 mg/dL (ref 70–99)

## 2020-10-01 LAB — MAGNESIUM: Magnesium: 1.6 mg/dL — ABNORMAL LOW (ref 1.7–2.4)

## 2020-10-01 MED ORDER — PANTOPRAZOLE SODIUM 40 MG PO TBEC
40.0000 mg | DELAYED_RELEASE_TABLET | Freq: Two times a day (BID) | ORAL | Status: DC
  Administered 2020-10-01 – 2020-10-12 (×20): 40 mg via ORAL
  Filled 2020-10-01 (×20): qty 1

## 2020-10-01 MED ORDER — DULOXETINE HCL 30 MG PO CPEP
120.0000 mg | ORAL_CAPSULE | Freq: Every day | ORAL | Status: DC
  Administered 2020-10-01: 120 mg via ORAL
  Filled 2020-10-01: qty 4

## 2020-10-01 MED ORDER — MIRTAZAPINE 15 MG PO TABS: 45.0000 mg | ORAL_TABLET | Freq: Every day | ORAL | Status: DC

## 2020-10-01 MED ORDER — CARVEDILOL 6.25 MG PO TABS
6.2500 mg | ORAL_TABLET | Freq: Two times a day (BID) | ORAL | Status: DC
  Administered 2020-10-01 – 2020-10-12 (×16): 6.25 mg via ORAL
  Filled 2020-10-01 (×16): qty 1

## 2020-10-01 MED ORDER — GABAPENTIN 300 MG PO CAPS: 300.0000 mg | ORAL_CAPSULE | Freq: Every day | ORAL | Status: DC

## 2020-10-01 MED ORDER — CLONAZEPAM 0.5 MG PO TABS
0.5000 mg | ORAL_TABLET | Freq: Two times a day (BID) | ORAL | Status: DC | PRN
  Administered 2020-10-03 – 2020-10-12 (×8): 0.5 mg via ORAL
  Filled 2020-10-01 (×8): qty 1

## 2020-10-01 MED ORDER — ENOXAPARIN SODIUM 120 MG/0.8ML ~~LOC~~ SOLN
120.0000 mg | SUBCUTANEOUS | Status: DC
  Administered 2020-10-01: 120 mg via SUBCUTANEOUS
  Filled 2020-10-01: qty 0.8

## 2020-10-01 MED ORDER — GUAIFENESIN 100 MG/5ML PO SOLN
5.0000 mL | ORAL | Status: DC | PRN
  Administered 2020-10-01: 100 mg via ORAL
  Filled 2020-10-01 (×4): qty 5

## 2020-10-01 MED ORDER — ALBUTEROL SULFATE HFA 108 (90 BASE) MCG/ACT IN AERS
2.0000 | INHALATION_SPRAY | Freq: Four times a day (QID) | RESPIRATORY_TRACT | Status: DC | PRN
  Filled 2020-10-01: qty 6.7

## 2020-10-01 MED ORDER — ROPINIROLE HCL 1 MG PO TABS
2.0000 mg | ORAL_TABLET | Freq: Two times a day (BID) | ORAL | Status: DC
  Administered 2020-10-01: 2 mg via ORAL
  Filled 2020-10-01: qty 2

## 2020-10-01 MED ORDER — MIRTAZAPINE 15 MG PO TABS
15.0000 mg | ORAL_TABLET | Freq: Every day | ORAL | Status: DC
  Administered 2020-10-01: 15 mg via ORAL
  Filled 2020-10-01: qty 1

## 2020-10-01 MED ORDER — APIXABAN 2.5 MG PO TABS
2.5000 mg | ORAL_TABLET | Freq: Two times a day (BID) | ORAL | Status: DC
  Administered 2020-10-02 – 2020-10-12 (×18): 2.5 mg via ORAL
  Filled 2020-10-01 (×19): qty 1

## 2020-10-01 MED ORDER — SUCRALFATE 1 G PO TABS
1.0000 g | ORAL_TABLET | Freq: Three times a day (TID) | ORAL | Status: DC
  Administered 2020-10-01 – 2020-10-12 (×28): 1 g via ORAL
  Filled 2020-10-01 (×28): qty 1

## 2020-10-01 MED ORDER — ALBUTEROL SULFATE (2.5 MG/3ML) 0.083% IN NEBU
2.5000 mg | INHALATION_SOLUTION | Freq: Four times a day (QID) | RESPIRATORY_TRACT | Status: DC | PRN
  Administered 2020-10-02: 2.5 mg via RESPIRATORY_TRACT
  Filled 2020-10-01: qty 3

## 2020-10-01 MED ORDER — ROPINIROLE HCL 1 MG PO TABS
0.5000 mg | ORAL_TABLET | Freq: Two times a day (BID) | ORAL | Status: DC
  Administered 2020-10-01 – 2020-10-02 (×2): 0.5 mg via ORAL
  Filled 2020-10-01 (×2): qty 1

## 2020-10-01 NOTE — Progress Notes (Signed)
   10/01/20 1055  Clinical Encounter Type  Visited With Patient and family together  Visit Type Initial;Spiritual support;Social support  Referral From Nurse  Consult/Referral To 85  Visited with Pt for AD education. Pt and his wife said they were not interested at this time. Ch will follow-up with Pt.

## 2020-10-01 NOTE — Progress Notes (Signed)
[facility]  ROUNDING NOTE   Subjective:   [name] is 74 years old male with past medical history of diabetes, hypertension, GERD, renal cell carcinoma post left nephrectomy in 2004, COPD, pancreatic insufficiency, CKD stage III , anemia of CKD sent in to the ED by his PCP for abnormal renal function test and progressively worsening bilateral lower extremity weakness. Patient's baseline creatinine is 2.5 on 07/13/2020.    Objective:  Vital signs in last 24 hours:  Temp:  [97.9 F (36.6 C)-99.1 F (37.3 C)] 99.1 F (37.3 C) (10/18 1208) Pulse Rate:  [43-99] 43 (10/18 1208) Resp:  [12-20] 18 (10/18 1208) BP: (109-162)/(43-143) 109/65 (10/18 1208) SpO2:  [80 %-100 %] 97 % (10/18 1208) Weight:  [113.4 kg-116.6 kg] 116.6 kg (10/18 0910)  Weight change:  Filed Weights   09/30/20 1658 10/01/20 0910  Weight: 113.4 kg 116.6 kg    Intake/Output: I/O last 3 completed shifts: In: 1000 [IV Piggyback:1000] Out: -    Intake/Output this shift:  Total I/O In: 845.7 [I.V.:845.7] Out: -   Physical Exam: General:  Lying in bed, in no acute distress  Head: Normocephalic, atraumatic. Moist oral mucosal membranes  Eyes: Anicteric  Neck: Supple  Lungs:  Clear to auscultation bilaterally, respiration even and unlabored  Heart: Regular rate and rhythm  Abdomen:  Soft, nontender,   Extremities:  No peripheral edema.  Neurologic:  Oriented x3, moving all four extremities, myoclonic jerks +  Skin: No acute lesions or rashes    Basic Metabolic Panel: Recent Labs  Lab 09/30/20 1953 10/01/20 0316 10/01/20 0321  NA 141  --  140  K 5.8*  --  5.2*  CL 116*  --  114*  CO2 12*  --  16*  GLUCOSE 66*  --  198*  BUN 112*  --  108*  CREATININE 6.55*  --  6.69*  CALCIUM 6.8*  --  6.5*  MG  --  1.6*  --   PHOS  --  10.4*  --     Liver Function Tests: No results for input(s): AST, ALT, ALKPHOS, BILITOT, PROT, ALBUMIN in the last 168 hours. No results for input(s): LIPASE,  AMYLASE in the last 168 hours. No results for input(s): AMMONIA in the last 168 hours.  CBC: Recent Labs  Lab 09/30/20 1953 10/01/20 0321  WBC 12.0* 13.6*  HGB 9.7* 8.4*  HCT 30.0* 26.1*  MCV 97.1 97.4  PLT 198 220    Cardiac Enzymes: No results for input(s): CKTOTAL, CKMB, CKMBINDEX, TROPONINI in the last 168 hours.  BNP: Invalid input(s): POCBNP  CBG: Recent Labs  Lab 09/30/20 2143 09/30/20 2305 10/01/20 0136 10/01/20 0744 10/01/20 1224  GLUCAP 146* 195* 171* 122* 91    Microbiology: Results for orders placed or performed during the hospital encounter of 09/30/20  Respiratory Panel by RT PCR (Flu A&B, Covid) - Nasopharyngeal Swab     Status: None   Collection Time: 09/30/20  9:07 PM   Specimen: Nasopharyngeal Swab  Result Value Ref Range Status   SARS Coronavirus 2 by RT PCR NEGATIVE NEGATIVE Final    Comment: (NOTE) SARS-CoV-2 target nucleic acids are NOT DETECTED.  The SARS-CoV-2 RNA is generally detectable in upper respiratoy specimens during the acute phase of infection. The lowest concentration of SARS-CoV-2 viral copies this assay can detect is 131 copies/mL. A negative result does not preclude SARS-Cov-2 infection and should not be used as the sole basis for treatment or other patient management decisions. A negative result may  occur with  improper specimen collection/handling, submission of specimen other than nasopharyngeal swab, presence of viral mutation(s) within the areas targeted by this assay, and inadequate number of viral copies (<131 copies/mL). A negative result must be combined with clinical observations, patient history, and epidemiological information. The expected result is Negative.  Fact Sheet for Patients:  PinkCheek.be  Fact Sheet for Healthcare Providers:  GravelBags.it  This test is no t yet approved or cleared by the [place] FDA and  has been authorized for  detection and/or diagnosis of SARS-CoV-2 by FDA under an Emergency Use Authorization (EUA). This EUA will remain  in effect (meaning this test can be used) for the duration of the COVID-19 declaration under Section 564(b)(1) of the Act, 21 U.S.C. section 360bbb-3(b)(1), unless the authorization is terminated or revoked sooner.     Influenza A by PCR NEGATIVE NEGATIVE Final   Influenza B by PCR NEGATIVE NEGATIVE Final    Comment: (NOTE) The Xpert Xpress SARS-CoV-2/FLU/RSV assay is intended as an aid in  the diagnosis of influenza from Nasopharyngeal swab specimens and  should not be used as a sole basis for treatment. Nasal washings and  aspirates are unacceptable for Xpert Xpress SARS-CoV-2/FLU/RSV  testing.  Fact Sheet for Patients: PinkCheek.be  Fact Sheet for Healthcare Providers: GravelBags.it  This test is not yet approved or cleared by the [place] FDA and  has been authorized for detection and/or diagnosis of SARS-CoV-2 by  FDA under an Emergency Use Authorization (EUA). This EUA will remain  in effect (meaning this test can be used) for the duration of the  Covid-19 declaration under Section 564(b)(1) of the Act, 21  U.S.C. section 360bbb-3(b)(1), unless the authorization is  terminated or revoked. Performed at [facility], [address] [place], [place] [address]     Coagulation Studies: No results for input(s): LABPROT, INR in the last 72 hours.  Urinalysis: Recent Labs    10/01/20 0316  COLORURINE YELLOW*  LABSPEC 1.008  PHURINE 5.0  GLUCOSEU NEGATIVE  HGBUR MODERATE*  BILIRUBINUR NEGATIVE  KETONESUR NEGATIVE  PROTEINUR 30*  NITRITE NEGATIVE  LEUKOCYTESUR NEGATIVE      Imaging: CT Abdomen Pelvis Wo Contrast  Result Date: 10/01/2020 CLINICAL DATA:  New renal failure. To rule out obstruction. Weakness in the leg starting a couple of days ago. EXAM: CT ABDOMEN AND PELVIS  WITHOUT CONTRAST TECHNIQUE: Multidetector CT imaging of the abdomen and pelvis was performed following the standard protocol without IV contrast. COMPARISON:  MRCP 09/14/2020 FINDINGS: Lower chest: Atelectasis in the lung bases. Residual contrast material in the esophagus may represent reflux or dysmotility. No esophageal dilatation. Mild cardiac enlargement. Coronary artery calcifications. Hepatobiliary: Unenhanced images of the liver are unremarkable. No focal lesions identified. Gallbladder is surgically absent. No bile duct dilatation. Pancreas: Unremarkable. No pancreatic ductal dilatation or surrounding inflammatory changes. Spleen: Normal in size without focal abnormality. Adrenals/Urinary Tract: Left adrenal gland is surgically absent. Right adrenal gland nodule measuring 2.3 cm diameter. Density measurements are 1.4 Hounsfield units consistent with a benign adenoma. Surgical absence of the left kidney. Cyst on the right kidney. No hydronephrosis or hydroureter. No ureteral stones or bladder stones. No bladder wall thickening or filling defect. Stomach/Bowel: Postoperative changes consistent with gastric bypass. Fluid in the excluded stomach could arise from reflux or staple line dehiscence. Dehiscence is less likely as no contrast material is demonstrated. Small bowel and colon are not abnormally distended. No inflammatory changes are identified. The appendix is normal. Vascular/Lymphatic: Aortic atherosclerosis. No  enlarged abdominal or pelvic lymph nodes. Reproductive: Prostate is unremarkable. Other: Small amount of free fluid around the liver possibly representing ascites. No free air. Abdominal wall musculature appears intact. Musculoskeletal: Degenerative changes in the spine and hips. No destructive bone lesions. IMPRESSION: 1. No evidence of bowel obstruction or inflammation. 2. Postoperative changes consistent with gastric bypass. Fluid in the excluded stomach could arise from reflux or staple line  dehiscence. Dehiscence is less likely as no contrast material is demonstrated. 3. Small amount of free fluid around the liver possibly representing ascites. 4. Right adrenal gland nodule consistent with a benign adenoma. 5. 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 No right renal or ureteral stone or obstruction. 6. Residual contrast material in the esophagus may represent reflux or dysmotility. 7. Aortic atherosclerosis. Aortic Atherosclerosis (ICD10-I70.0). Electronically Signed   By: Lucienne Capers M.D.   On: 10/01/2020 01:07   DG Chest Port 1 View  Result Date: 09/30/2020 CLINICAL DATA:  74 year old male with cough. Lower extremity weakness. Former smoker. EXAM: PORTABLE CHEST 1 VIEW COMPARISON:  Portable chest 03/25/2020 and earlier. FINDINGS: Portable AP semi upright view at 2148 hours. Stable lung volumes and mediastinal contours. Cardiac size within normal limits. Visualized tracheal air column is within normal limits. Resolved patchy left lung opacity since April. Underlying increased pulmonary interstitium not significantly changed. No pneumothorax, pulmonary edema, pleural effusion or new pulmonary opacity. There is a new electronic device projecting over the right cardiophrenic angle of unclear significance. No acute osseous abnormality identified. IMPRESSION: No acute cardiopulmonary abnormality. Electronically Signed   By: Genevie Ann M.D.   On: 09/30/2020 21:57     Medications:   . sodium bicarbonate (isotonic) 150 mEq in D5W 1000 mL infusion 125 mL/hr at 10/01/20 1232   . [START ON 10/02/2020] apixaban  2.5 mg Oral BID  . carvedilol  6.25 mg Oral BID WC  . DULoxetine  120 mg Oral Daily  . insulin aspart  0-15 Units Subcutaneous TID WC  . insulin aspart  0-5 Units Subcutaneous QHS  . mirtazapine  45 mg Oral QHS  . pantoprazole  40 mg Oral BID  . rOPINIRole  2 mg Oral BID  . sodium chloride flush  3 mL Intravenous Q12H  . sucralfate  1 g Oral TID   acetaminophen  **OR** acetaminophen, albuterol, albuterol, clonazePAM, dextrose, ondansetron **OR** ondansetron (ZOFRAN) IV, oxyCODONE-acetaminophen **AND** oxyCODONE, sodium chloride flush  Assessment/ Plan:  Mr. Egor Fullilove is a 74 y.o.  male Mr. Swiatek is 74 years old male with past medical history of diabetes, hypertension, GERD, renal cell carcinoma post left nephrectomy in 2004, COPD, pancreatic insufficiency, CKD stage III , anemia of CKD sent in to the ED by his PCP for abnormal renal function test and progressively worsening bilateral lower extremity weakness.  # Acute kidney injury on CKD stage IIIa #Metabolic acidosis  Patient's baseline creatinine per records on 07/13/2020 is 2.5 Creatinine today 6.69 BUN 108 CO2 16  -Patient  has history of renal cell carcinoma with left nephrectomy in 2004 -Patient appears slightly hypovolemic -He is on D5 with sodium bicarbonate infusion 125 mL/h -We will reduce the rate to 75 mL/h to avoid overload -He is on furosemide 40 mg BID at home,we will hold furosemide for now -Patient has myoclonic jerks,will hold Gabapentin  #Hyperkalemia Potassium 5.8 on arrival Down to 5.2 today Will continue monitoring  #Anemia of CKD Hemoglobin 8.4 today We will continue monitoring closely  # Hypertension Normotensive today, BP  109/65 On carvedilol  #Secondary Hyperparathyroidism Calcium 6.5 Phosphorus 10.4  #Hypoglycemia Treated in Emergency Department with IV Dextrose 50 % and Dextrose 10% Blood glucose today is 198 We will continue monitoring           LOS: 1 Marcel Gary 10/18/20211:24 PM

## 2020-10-01 NOTE — ED Notes (Signed)
Pt trialed on RA, O2 sat decreased to 82%, placed back on 2L [place] and O2 increased to 90%. Pt placed on 4L [place] with O2 sat at 94-96% while sleeping

## 2020-10-01 NOTE — Progress Notes (Signed)
ANTICOAGULATION CONSULT NOTE - Initial Consult  Pharmacy Consult for Lovenox until Eliquis can be resumed Indication: Pt on Eliquis PTA, now w/ AKI  Allergies  Allergen Reactions  . Bupropion     Other reaction(s): Other (See Comments) Mood swings, angry   Patient Measurements: Height: 6' (182.9 cm) Weight: 113.4 kg (250 lb) IBW/kg (Calculated) : 77.6  Vital Signs: Temp: 97.9 F (36.6 C) (10/17 1701) Temp Source: Oral (10/17 1701) BP: 132/57 (10/18 0322) Pulse Rate: 94 (10/18 0322)  Labs: Recent Labs    09/30/20 1953 10/01/20 0321  HGB 9.7* 8.4*  HCT 30.0* 26.1*  PLT 198 220  CREATININE 6.55*  --    Estimated Creatinine Clearance: 13.4 mL/min (A) (by C-G formula based on SCr of 6.55 mg/dL (H)).  Medical History: Past Medical History:  Diagnosis Date  . Chronic back pain   . COPD (chronic obstructive pulmonary disease) ([place])   . Depression   . Diabetes mellitus without complication ([place])   . Hyperlipidemia   . Hypertension   . Renal cell carcinoma ([place])   . Sleep apnea     Medications:  (Not in a hospital admission)   Assessment: 73Yo male on Eliquis 5mg  BID PTA, now w/ aki.    Goal of Therapy:  Anti-Xa level 0.6-1 units/ml 4hrs after LMWH dose given Monitor platelets by anticoagulation protocol: Yes   Plan:  Lovenox 1mg /Kg SQ q24hrs until OK to resume Eliquis Monitor CBC  [place] Crane, Sashia Campas A 10/01/2020,4:10 AM

## 2020-10-01 NOTE — ED Notes (Signed)
MD Damita Dunnings made aware via text page about increased WBC and decreased hemoglobin

## 2020-10-01 NOTE — Progress Notes (Signed)
VAST consulted to obtain IV access as current site is in pt's [facility] and he bends his arm. Upon arrival at pt's bedside, unit RN was assessing site and infusion which was continuously beeping and not infusing. IV with excellent blood return and flushed easily with NS. After troubleshooting IV tubing and machine, kinking of tubing noted above IV pump. Once this was fixed, pump and IV running well. Pt's wife reported when he moves his arm, IV pump stops and beeps. Assessed pt's vasculature visually, but only vessels visible were in pt's left AC. Ultrasound currently unavailable for use, but once [place] is available this afternoon, VAST RN will return and assess for new IV placement. Notified pt's nurse of findings and plan.

## 2020-10-01 NOTE — Progress Notes (Signed)
Pt BS is 62. Gave pt orange juice and peanut butter. Will recheck BS.

## 2020-10-01 NOTE — Progress Notes (Addendum)
Progress Note    [name]  OIB:[id number] DOB: 1946-04-25  DOA: 09/30/2020 PCP: Derinda Late, MD      Brief Narrative:    Medical records reviewed and are as summarized below:  [name] is a 74 y.o. 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  He was referred to the emergency room by his PCP because of lower extremity weakness and acute kidney injury.  He said his legs felt like "spaghetti".      Assessment/Plan:   Principal Problem:   AKI (acute kidney injury) ([place]) Active Problems:   Essential hypertension   Type II diabetes mellitus with renal manifestations (HCC)   Anemia in chronic kidney disease   Spinal stenosis, lumbar region, with neurogenic claudication   Hyperkalemia   COPD (chronic obstructive pulmonary disease) ([place])   CKD stage 3 due to type 2 diabetes mellitus ([place])   History of renal cell carcinoma   History of left nephrectomy 2004   Hypoglycemia   Pancreatic insufficiency   Chronic diarrhea   Metabolic acidosis   Body mass index is 34.86 kg/m.    PLAN  AKI on CKD stage IIIb complicated by hyperkalemia and metabolic acidosis/history of left nephrectomy/myoclonic jerks: Continue IV sodium bicarbonate infusion.  Follow-up with nephrologist.  Monitor BMP.  Doses of Remeron and Requip have been decreased.  Gabapentin and Cymbalta have been discontinued.  Hypertension: Continue carvedilol  History of DVT and PE: Discontinue therapeutic Lovenox and resume Eliquis at a lower dose.    Type II DM with recent hypoglycemia: Glipizide has been held.  Humalog as needed for hyperglycemia.  Anemia of chronic kidney disease: H&H is stable.   Continue to monitor.  Chronic pancreatitis/chronic diarrhea: Continue Creon  COPD: Continue bronchodilators as needed  Chronic back pain: Analgesics as needed for pain.  Depression: Continue antidepressants.  Cymbalta has been discontinued for now because of AKI.   Diet Order            Diet heart healthy/carb modified Room service appropriate? Yes; Fluid consistency: Thin  Diet effective now                    Consultants:  Nephrologist   Procedures:  None    Medications:   . [START ON 10/02/2020] apixaban  2.5 mg Oral BID  . carvedilol  6.25 mg Oral BID WC  . insulin aspart  0-15 Units Subcutaneous TID WC  . insulin aspart  0-5 Units Subcutaneous QHS  . mirtazapine  15 mg Oral QHS  . pantoprazole  40 mg Oral BID  . rOPINIRole  0.5 mg Oral BID  . sodium chloride flush  3 mL Intravenous Q12H  . sucralfate  1 g Oral TID   Continuous Infusions: . sodium bicarbonate (isotonic) 150 mEq in D5W 1000 mL infusion 75 mL/hr at 10/01/20 1427     Anti-infectives (From admission, onward)   None             Family Communication/Anticipated D/C date and plan/Code Status   DVT prophylaxis: apixaban (ELIQUIS) tablet 2.5 mg Start: 10/02/20 1000 apixaban (ELIQUIS) tablet 2.5 mg     Code Status: Full Code  Family Communication: Plan discussed with patient Disposition Plan:  Status is: Inpatient  Remains inpatient appropriate because:IV treatments appropriate due to intensity of illness or inability to take PO and Inpatient level of care appropriate due to severity of illness   Dispo: The patient is from: Home              Anticipated d/c is to: Home              Anticipated d/c date is: 3 days              Patient currently is not medically stable to d/c.           Subjective:   He complains of generalized weakness and shaking of hands  Objective:    Vitals:   10/01/20 0600 10/01/20 0745 10/01/20 0910 10/01/20 1208  BP: (!) 148/135  (!) 155/99 (!) 116/53 109/65  Pulse: 92 99 91 (!) 43  Resp: 14 19 20 18   Temp:   98 F (36.7 C) 99.1 F (37.3 C)  TempSrc:   Oral Oral  SpO2: 92% 96% 98% 97%  Weight:   116.6 kg   Height:   6' (1.829 m)    No data found.   Intake/Output Summary (Last 24 hours) at 10/01/2020 1519 Last data filed at 10/01/2020 1339 Gross per 24 hour  Intake 2085.66 ml  Output --  Net 2085.66 ml   Filed Weights   09/30/20 1658 10/01/20 0910  Weight: 113.4 kg 116.6 kg    Exam:  GEN: NAD SKIN: Warm and dry EYES: EOMI.  No pallor or icterus ENT: MMM CV: RRR PULM: CTA B ABD: soft, obese, NT, +BS CNS: AAO x 3, non focal. Myoclonic jerks of bilateral upper extremities EXT: No edema or tenderness    Data Reviewed:   I have personally reviewed following labs and imaging studies:  Labs: Labs show the following:   Basic Metabolic Panel: Recent Labs  Lab 09/30/20 1953 10/01/20 0316 10/01/20 0321  NA 141  --  140  K 5.8*  --  5.2*  CL 116*  --  114*  CO2 12*  --  16*  GLUCOSE 66*  --  198*  BUN 112*  --  108*  CREATININE 6.55*  --  6.69*  CALCIUM 6.8*  --  6.5*  MG  --  1.6*  --   PHOS  --  10.4*  --    GFR Estimated Creatinine Clearance: 13.4 mL/min (A) (by C-G formula based on SCr of 6.69 mg/dL (H)). Liver Function Tests: No results for input(s): AST, ALT, ALKPHOS, BILITOT, PROT, ALBUMIN in the last 168 hours. No results for input(s): LIPASE, AMYLASE in the last 168 hours. No results for input(s): AMMONIA in the last 168 hours. Coagulation profile No results for input(s): INR, PROTIME in the last 168 hours.  CBC: Recent Labs  Lab 09/30/20 1953 10/01/20 0321  WBC 12.0* 13.6*  HGB 9.7* 8.4*  HCT 30.0* 26.1*  MCV 97.1 97.4  PLT 198 220   Cardiac Enzymes: No results for input(s): CKTOTAL, CKMB, CKMBINDEX, TROPONINI in the last 168 hours. BNP (last 3 results) No results for input(s): PROBNP in the last 8760 hours. CBG: Recent Labs  Lab 09/30/20 2143  09/30/20 2305 10/01/20 0136 10/01/20 0744 10/01/20 1224  GLUCAP 146* 195* 171* 122* 91   D-Dimer: No results for input(s): DDIMER in the last 72 hours. Hgb A1c: No results for input(s): HGBA1C in the last 72 hours. Lipid Profile: No results for input(s): CHOL, HDL, LDLCALC, TRIG, CHOLHDL, LDLDIRECT in  the last 72 hours. Thyroid function studies: No results for input(s): TSH, T4TOTAL, T3FREE, THYROIDAB in the last 72 hours.  Invalid input(s): FREET3 Anemia work up: No results for input(s): VITAMINB12, FOLATE, FERRITIN, TIBC, IRON, RETICCTPCT in the last 72 hours. Sepsis Labs: Recent Labs  Lab 09/30/20 1953 10/01/20 0321  WBC 12.0* 13.6*    Microbiology Recent Results (from the past 240 hour(s))  Respiratory Panel by RT PCR (Flu A&B, Covid) - Nasopharyngeal Swab     Status: None   Collection Time: 09/30/20  9:07 PM   Specimen: Nasopharyngeal Swab  Result Value Ref Range Status   SARS Coronavirus 2 by RT PCR NEGATIVE NEGATIVE Final    Comment: (NOTE) SARS-CoV-2 target nucleic acids are NOT DETECTED.  The SARS-CoV-2 RNA is generally detectable in upper respiratoy specimens during the acute phase of infection. The lowest concentration of SARS-CoV-2 viral copies this assay can detect is 131 copies/mL. A negative result does not preclude SARS-Cov-2 infection and should not be used as the sole basis for treatment or other patient management decisions. A negative result may occur with  improper specimen collection/handling, submission of specimen other than nasopharyngeal swab, presence of viral mutation(s) within the areas targeted by this assay, and inadequate number of viral copies (<131 copies/mL). A negative result must be combined with clinical observations, patient history, and epidemiological information. The expected result is Negative.  Fact Sheet for Patients:  PinkCheek.be  Fact Sheet for Healthcare Providers:   GravelBags.it  This test is no t yet approved or cleared by the [place] FDA and  has been authorized for detection and/or diagnosis of SARS-CoV-2 by FDA under an Emergency Use Authorization (EUA). This EUA will remain  in effect (meaning this test can be used) for the duration of the COVID-19 declaration under Section 564(b)(1) of the Act, 21 U.S.C. section 360bbb-3(b)(1), unless the authorization is terminated or revoked sooner.     Influenza A by PCR NEGATIVE NEGATIVE Final   Influenza B by PCR NEGATIVE NEGATIVE Final    Comment: (NOTE) The Xpert Xpress SARS-CoV-2/FLU/RSV assay is intended as an aid in  the diagnosis of influenza from Nasopharyngeal swab specimens and  should not be used as a sole basis for treatment. Nasal washings and  aspirates are unacceptable for Xpert Xpress SARS-CoV-2/FLU/RSV  testing.  Fact Sheet for Patients: PinkCheek.be  Fact Sheet for Healthcare Providers: GravelBags.it  This test is not yet approved or cleared by the [place] FDA and  has been authorized for detection and/or diagnosis of SARS-CoV-2 by  FDA under an Emergency Use Authorization (EUA). This EUA will remain  in effect (meaning this test can be used) for the duration of the  Covid-19 declaration under Section 564(b)(1) of the Act, 21  U.S.C. section 360bbb-3(b)(1), unless the authorization is  terminated or revoked. Performed at [facility], [address], [place], [place] [address]     Procedures and diagnostic studies:  CT Abdomen Pelvis Wo Contrast  Result Date: 10/01/2020 CLINICAL DATA:  New renal failure. To rule out obstruction. Weakness in the leg starting a couple of days ago. EXAM: CT ABDOMEN AND PELVIS WITHOUT CONTRAST TECHNIQUE: Multidetector CT imaging of the abdomen and pelvis was performed following the standard protocol without IV contrast. COMPARISON:  MRCP  09/14/2020 FINDINGS: Lower chest: Atelectasis in the lung bases. Residual contrast material in the esophagus may represent reflux or dysmotility. No esophageal dilatation. Mild cardiac enlargement. Coronary artery calcifications. Hepatobiliary: Unenhanced images of the liver are unremarkable. No focal  lesions identified. Gallbladder is surgically absent. No bile duct dilatation. Pancreas: Unremarkable. No pancreatic ductal dilatation or surrounding inflammatory changes. Spleen: Normal in size without focal abnormality. Adrenals/Urinary Tract: Left adrenal gland is surgically absent. Right adrenal gland nodule measuring 2.3 cm diameter. Density measurements are 1.4 Hounsfield units consistent with a benign adenoma. Surgical absence of the left kidney. Cyst on the right kidney. No hydronephrosis or hydroureter. No ureteral stones or bladder stones. No bladder wall thickening or filling defect. Stomach/Bowel: Postoperative changes consistent with gastric bypass. Fluid in the excluded stomach could arise from reflux or staple line dehiscence. Dehiscence is less likely as no contrast material is demonstrated. Small bowel and colon are not abnormally distended. No inflammatory changes are identified. The appendix is normal. Vascular/Lymphatic: Aortic atherosclerosis. No enlarged abdominal or pelvic lymph nodes. Reproductive: Prostate is unremarkable. Other: Small amount of free fluid around the liver possibly representing ascites. No free air. Abdominal wall musculature appears intact. Musculoskeletal: Degenerative changes in the spine and hips. No destructive bone lesions. IMPRESSION: 1. No evidence of bowel obstruction or inflammation. 2. Postoperative changes consistent with gastric bypass. Fluid in the excluded stomach could arise from reflux or staple line dehiscence. Dehiscence is less likely as no contrast material is demonstrated. 3. Small amount of free fluid around the liver possibly representing ascites. 4.  Right adrenal gland nodule consistent with a benign adenoma. 5. 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 No right renal or ureteral stone or obstruction. 6. Residual contrast material in the esophagus may represent reflux or dysmotility. 7. Aortic atherosclerosis. Aortic Atherosclerosis (ICD10-I70.0). Electronically Signed   By: Lucienne Capers M.D.   On: 10/01/2020 01:07   DG Chest Port 1 View  Result Date: 09/30/2020 CLINICAL DATA:  74 year old male with cough. Lower extremity weakness. Former smoker. EXAM: PORTABLE CHEST 1 VIEW COMPARISON:  Portable chest 03/25/2020 and earlier. FINDINGS: Portable AP semi upright view at 2148 hours. Stable lung volumes and mediastinal contours. Cardiac size within normal limits. Visualized tracheal air column is within normal limits. Resolved patchy left lung opacity since April. Underlying increased pulmonary interstitium not significantly changed. No pneumothorax, pulmonary edema, pleural effusion or new pulmonary opacity. There is a new electronic device projecting over the right cardiophrenic angle of unclear significance. No acute osseous abnormality identified. IMPRESSION: No acute cardiopulmonary abnormality. Electronically Signed   By: Genevie Ann M.D.   On: 09/30/2020 21:57               LOS: 1 day   Mishawn Didion  [facility]   Pager on [email or web address]. If 7PM-7AM, please contact night-coverage at [email or web address]     10/01/2020, 3:19 PM

## 2020-10-01 NOTE — ED Notes (Signed)
Pt desat to 86-88% on RA, placed on 2L NA with improvement to 96-98%

## 2020-10-02 ENCOUNTER — Encounter
Admission: EM | Disposition: A | Discharge status: Home / Self Care | Payer: Self-pay | Source: Ambulatory Visit | Attending: Internal Medicine

## 2020-10-02 ENCOUNTER — Inpatient Hospital Stay: Payer: Medicare Other

## 2020-10-02 DIAGNOSIS — E162 Hypoglycemia, unspecified: Secondary | ICD-10-CM | POA: Diagnosis not present

## 2020-10-02 DIAGNOSIS — E875 Hyperkalemia: Secondary | ICD-10-CM

## 2020-10-02 DIAGNOSIS — R531 Weakness: Secondary | ICD-10-CM

## 2020-10-02 DIAGNOSIS — E872 Acidosis: Secondary | ICD-10-CM | POA: Diagnosis not present

## 2020-10-02 DIAGNOSIS — N179 Acute kidney failure, unspecified: Secondary | ICD-10-CM

## 2020-10-02 HISTORY — PX: TEMPORARY DIALYSIS CATHETER: CATH118312

## 2020-10-02 LAB — BASIC METABOLIC PANEL
Anion gap: 19 — ABNORMAL HIGH (ref 5–15)
Anion gap: 17 — ABNORMAL HIGH (ref 5–15)
BUN: 126 mg/dL — ABNORMAL HIGH (ref 8–23)
BUN: 130 mg/dL — ABNORMAL HIGH (ref 8–23)
CO2: 11 mmol/L — ABNORMAL LOW (ref 22–32)
CO2: 12 mmol/L — ABNORMAL LOW (ref 22–32)
Calcium: 5.7 mg/dL — CL (ref 8.9–10.3)
Calcium: 5.8 mg/dL — CL (ref 8.9–10.3)
Chloride: 112 mmol/L — ABNORMAL HIGH (ref 98–111)
Chloride: 111 mmol/L (ref 98–111)
Creatinine, Ser: 8.12 mg/dL — ABNORMAL HIGH (ref 0.61–1.24)
Creatinine, Ser: 8.82 mg/dL — ABNORMAL HIGH (ref 0.61–1.24)
GFR, Estimated: 6 mL/min — ABNORMAL LOW (ref >60)
GFR, Estimated: 5 mL/min — ABNORMAL LOW (ref >60)
Glucose, Bld: 61 mg/dL — ABNORMAL LOW (ref 70–99)
Glucose, Bld: 62 mg/dL — ABNORMAL LOW (ref 70–99)
Potassium: 4.3 mmol/L (ref 3.5–5.1)
Potassium: 4 mmol/L (ref 3.5–5.1)
Sodium: 142 mmol/L (ref 135–145)
Sodium: 140 mmol/L (ref 135–145)

## 2020-10-02 LAB — CBC
HCT: 23.9 % — ABNORMAL LOW (ref 39.0–52.0)
Hemoglobin: 7.6 g/dL — ABNORMAL LOW (ref 13.0–17.0)
MCH: 30.8 pg (ref 26.0–34.0)
MCHC: 31.8 g/dL (ref 30.0–36.0)
MCV: 96.8 fL (ref 80.0–100.0)
Platelets: 188 10*3/uL (ref 150–400)
RBC: 2.47 MIL/uL — ABNORMAL LOW (ref 4.22–5.81)
RDW: 15 % (ref 11.5–15.5)
WBC: 9.2 10*3/uL (ref 4.0–10.5)
nRBC: 0 % (ref 0.0–0.2)

## 2020-10-02 LAB — HEPATITIS C ANTIBODY: HCV Ab: NONREACTIVE

## 2020-10-02 LAB — HEPATIC FUNCTION PANEL
ALT: 15 U/L (ref 0–44)
AST: 14 U/L — ABNORMAL LOW (ref 15–41)
Albumin: 3 g/dL — ABNORMAL LOW (ref 3.5–5.0)
Alkaline Phosphatase: 107 U/L (ref 38–126)
Bilirubin, Direct: <0.1 mg/dL (ref 0.0–0.2)
Total Bilirubin: 0.7 mg/dL (ref 0.3–1.2)
Total Protein: 5.9 g/dL — ABNORMAL LOW (ref 6.5–8.1)

## 2020-10-02 LAB — GLUCOSE, CAPILLARY
Glucose-Capillary: 137 mg/dL — ABNORMAL HIGH (ref 70–99)
Glucose-Capillary: 53 mg/dL — ABNORMAL LOW (ref 70–99)
Glucose-Capillary: 55 mg/dL — ABNORMAL LOW (ref 70–99)
Glucose-Capillary: 61 mg/dL — ABNORMAL LOW (ref 70–99)
Glucose-Capillary: 71 mg/dL (ref 70–99)
Glucose-Capillary: 71 mg/dL (ref 70–99)
Glucose-Capillary: 72 mg/dL (ref 70–99)
Glucose-Capillary: 76 mg/dL (ref 70–99)
Glucose-Capillary: 82 mg/dL (ref 70–99)
Glucose-Capillary: 87 mg/dL (ref 70–99)
Glucose-Capillary: 89 mg/dL (ref 70–99)

## 2020-10-02 LAB — LACTIC ACID, PLASMA: Lactic Acid, Venous: 0.5 mmol/L (ref 0.5–1.9)

## 2020-10-02 LAB — HEPATITIS B SURFACE ANTIGEN: Hepatitis B Surface Ag: NONREACTIVE

## 2020-10-02 LAB — HEMOGLOBIN A1C
Hgb A1c MFr Bld: 4.9 % (ref 4.8–5.6)
Mean Plasma Glucose: 94 mg/dL

## 2020-10-02 LAB — PHOSPHORUS
Phosphorus: 10.7 mg/dL — ABNORMAL HIGH (ref 2.5–4.6)
Phosphorus: 11.5 mg/dL — ABNORMAL HIGH (ref 2.5–4.6)

## 2020-10-02 LAB — HEPATITIS B CORE ANTIBODY, TOTAL: Hep B Core Total Ab: NONREACTIVE

## 2020-10-02 LAB — HEPATITIS B SURFACE ANTIBODY,QUALITATIVE: Hep B S Ab: NONREACTIVE

## 2020-10-02 LAB — PROCALCITONIN: Procalcitonin: 4.5 ng/mL

## 2020-10-02 SURGERY — TEMPORARY DIALYSIS CATHETER
Anesthesia: LOCAL

## 2020-10-02 MED ORDER — LIDOCAINE-PRILOCAINE 2.5-2.5 % EX CREA
1.0000 "application " | TOPICAL_CREAM | CUTANEOUS | Status: DC | PRN
  Filled 2020-10-02: qty 5

## 2020-10-02 MED ORDER — CALCIUM GLUCONATE-NACL 1-0.675 GM/50ML-% IV SOLN
1.0000 g | Freq: Once | INTRAVENOUS | Status: AC
  Administered 2020-10-02: 1000 mg via INTRAVENOUS
  Filled 2020-10-02: qty 50

## 2020-10-02 MED ORDER — PENTAFLUOROPROP-TETRAFLUOROETH EX AERO
1.0000 "application " | INHALATION_SPRAY | CUTANEOUS | Status: DC | PRN
  Filled 2020-10-02: qty 30

## 2020-10-02 MED ORDER — LIDOCAINE HCL (PF) 1 % IJ SOLN
5.0000 mL | INTRAMUSCULAR | Status: DC | PRN
  Filled 2020-10-02: qty 5

## 2020-10-02 MED ORDER — SODIUM CHLORIDE 0.9 % IV SOLN: 100.0000 mL | INTRAVENOUS | Status: DC | PRN

## 2020-10-02 MED ORDER — DEXTROSE 50 % IV SOLN
25.0000 g | Freq: Once | INTRAVENOUS | Status: AC
  Administered 2020-10-02: 25 g via INTRAVENOUS
  Filled 2020-10-02: qty 50

## 2020-10-02 MED ORDER — DEXTROSE 50 % IV SOLN
12.5000 g | INTRAVENOUS | Status: AC
  Administered 2020-10-02: 12.5 g via INTRAVENOUS
  Filled 2020-10-02: qty 50

## 2020-10-02 MED ORDER — DEXTROSE 10 % IV SOLN: INTRAVENOUS | Status: DC

## 2020-10-02 MED ORDER — SODIUM CHLORIDE 0.9 % IV SOLN
1.0000 g | INTRAVENOUS | Status: DC
  Administered 2020-10-02 – 2020-10-04 (×3): 1 g via INTRAVENOUS
  Filled 2020-10-02: qty 10
  Filled 2020-10-02 (×3): qty 1

## 2020-10-02 MED ORDER — LIDOCAINE HCL (PF) 1 % IJ SOLN
INTRAMUSCULAR | Status: DC | PRN
  Administered 2020-10-02: 10 mL

## 2020-10-02 MED ORDER — CALCIUM GLUCONATE-NACL 1-0.675 GM/50ML-% IV SOLN
1.0000 g | Freq: Once | INTRAVENOUS | Status: DC
  Filled 2020-10-02: qty 50

## 2020-10-02 MED ORDER — HEPARIN SODIUM (PORCINE) 1000 UNIT/ML DIALYSIS
1000.0000 [IU] | INTRAMUSCULAR | Status: DC | PRN
  Filled 2020-10-02: qty 1

## 2020-10-02 MED ORDER — CHLORHEXIDINE GLUCONATE CLOTH 2 % EX PADS
6.0000 | MEDICATED_PAD | Freq: Every day | CUTANEOUS | Status: DC
  Administered 2020-10-02 – 2020-10-09 (×4): 6 via TOPICAL

## 2020-10-02 MED ORDER — GLUCOSE 40 % PO GEL
1.0000 | ORAL | Status: AC
  Administered 2020-10-02: 37.5 g via ORAL
  Filled 2020-10-02: qty 1

## 2020-10-02 SURGICAL SUPPLY — 1 items: KIT DIALYSIS CATH TRI 30X13 (CATHETERS) ×2 IMPLANT

## 2020-10-02 NOTE — Progress Notes (Signed)
Per Dr. Mal Misty, he discontinued iv calcium, he is waiting on other lab results. Also made md aware per Dr. Candiss Norse d10 can be increased to 25ml/hr. However per dr. Mal Misty he wants to wait for repeat sugar this afternoon, to determine if d10 needs to be increased.Will continue to monitor

## 2020-10-02 NOTE — Progress Notes (Signed)
Pt arrived  in Dialysis  Approx 1745  Treatment started 1815 Completed 2015  Education provided  Safety Checks complete  First treatment   DURATION: 2 hours removed 0.5 liters 3K 2.5 ca bath BFR 250 DFR500 UFR 500  Hepatitis status Pending  Tolerated well. All blood returned to patient with no complications,  Catheter Heparin locked.                   Results for [name], [name] (MRN [id number]) as of 10/02/2020 19:17  Ref. Range 10/02/2020 79:39  BASIC METABOLIC PANEL Unknown Rpt (A)  Sodium Latest Ref Range: 135 - 145 mmol/L 142  Potassium Latest Ref Range: 3.5 - 5.1 mmol/L 4.3  Chloride Latest Ref Range: 98 - 111 mmol/L 112 (H)  CO2 Latest Ref Range: 22 - 32 mmol/L 11 (L)  Glucose Latest Ref Range: 70 - 99 mg/dL 61 (L)  BUN Latest Ref Range: 8 - 23 mg/dL 126 (H)  Creatinine Latest Ref Range: 0.61 - 1.24 mg/dL 8.12 (H)  Calcium Latest Ref Range: 8.9 - 10.3 mg/dL 5.7 (LL)  Anion gap Latest Ref Range: 5 - 15  19 (H)  Phosphorus Latest Ref Range: 2.5 - 4.6 mg/dL 10.7 (H)  Alkaline Phosphatase Latest Ref Range: 38 - 126 U/L 107  Albumin Latest Ref Range: 3.5 - 5.0 g/dL 3.0 (L)  AST Latest Ref Range: 15 - 41 U/L 14 (L)  ALT Latest Ref Range: 0 - 44 U/L 15  Total Protein Latest Ref Range: 6.5 - 8.1 g/dL 5.9 (L)  Bilirubin, Direct Latest Ref Range: 0.0 - 0.2 mg/dL <0.1  Indirect Bilirubin Latest Ref Range: 0.3 - 0.9 mg/dL NOT CALCULATED  Total Bilirubin Latest Ref Range: 0.3 - 1.2 mg/dL 0.7  GFR, Estimated Latest Ref Range: >60 mL/min 6 (L)  WBC Latest Ref Range: 4.0 - 10.5 K/uL 9.2  RBC Latest Ref Range: 4.22 - 5.81 MIL/uL 2.47 (L)  Hemoglobin Latest Ref Range: 13.0 - 17.0 g/dL 7.6 (L)  HCT Latest Ref Range: 39 - 52 % 23.9 (L)  MCV Latest Ref Range: 80.0 - 100.0 fL 96.8  MCH Latest Ref Range: 26.0 - 34.0 pg 30.8  MCHC Latest Ref Range: 30.0 - 36.0 g/dL 31.8  RDW Latest Ref Range: 11.5 - 15.5 % 15.0  Platelets Latest Ref Range: 150 - 400 K/uL 188  nRBC Latest Ref  Range: 0.0 - 0.2 % 0.0

## 2020-10-02 NOTE — Progress Notes (Signed)
Hypoglycemic Event  CBG: 61  Treatment: 1 tube glucose gel  Symptoms: None  Follow-up CBG: Time:1330  CBG Result:55  Possible Reasons for Event: Inadequate meal intake  Comments/MD notified: difficult for patient to take the po gel. Will give iv per order    Santina Evans

## 2020-10-02 NOTE — Progress Notes (Signed)
[facility]  ROUNDING NOTE   Subjective:   [name] is 74 years old male with past medical history of diabetes, hypertension, GERD, renal cell carcinoma post left nephrectomy in 2004, COPD, pancreatic insufficiency, CKD stage III , anemia of CKD sent in to the ED by his PCP for abnormal renal function test and progressively worsening bilateral lower extremity weakness. Patient's baseline creatinine is 2.2/GFR 30 from 06/26/2020  Objective:  Vital signs in last 24 hours:  Temp:  [98.7 F (37.1 C)-100.7 F (38.2 C)] 100.6 F (38.1 C) (10/19 0925) Pulse Rate:  [43-103] 103 (10/19 0925) Resp:  [14-59] 18 (10/19 0925) BP: (109-151)/(56-73) 151/73 (10/19 0925) SpO2:  [91 %-97 %] 95 % (10/19 0925) Weight:  [117.5 kg] 117.5 kg (10/19 0400)  Weight change: 3.175 kg Filed Weights   09/30/20 1658 10/01/20 0910 10/02/20 0400  Weight: 113.4 kg 116.6 kg 117.5 kg    Intake/Output: I/O last 3 completed shifts: In: 3588.6 [P.O.:340; I.V.:2248.6; IV Piggyback:1000] Out: 0    Intake/Output this shift:  Total I/O In: 302.8 [I.V.:260; IV Piggyback:42.8] Out: -   Physical Exam: General:  Lethargic,in moderate distress  Head: Normocephalic, atraumatic. Moist oral mucosal membranes  Eyes: Anicteric  Neck: Supple  Lungs:  Increased work of breathing,bilateral coarse crackles +  Heart: Regular rate and rhythm  Abdomen:  Soft, nontender, distended  Extremities:  Trace peripheral edema.  Neurologic:  Sleeping,drowsy,arousable to call  Skin: No acute lesions or rashes    Basic Metabolic Panel: Recent Labs  Lab 09/30/20 1953 10/01/20 0316 10/01/20 0321 10/02/20 0528  NA 141  --  140 142  K 5.8*  --  5.2* 4.3  CL 116*  --  114* 112*  CO2 12*  --  16* 11*  GLUCOSE 66*  --  198* 61*  BUN 112*  --  108* 126*  CREATININE 6.55*  --  6.69* 8.12*  CALCIUM 6.8*  --  6.5* 5.7*  MG  --  1.6*  --   --   PHOS  --  10.4*  --   --     Liver Function Tests: No results for  input(s): AST, ALT, ALKPHOS, BILITOT, PROT, ALBUMIN in the last 168 hours. No results for input(s): LIPASE, AMYLASE in the last 168 hours. No results for input(s): AMMONIA in the last 168 hours.  CBC: Recent Labs  Lab 09/30/20 1953 10/01/20 0321 10/02/20 0528  WBC 12.0* 13.6* 9.2  HGB 9.7* 8.4* 7.6*  HCT 30.0* 26.1* 23.9*  MCV 97.1 97.4 96.8  PLT 198 220 188    Cardiac Enzymes: No results for input(s): CKTOTAL, CKMB, CKMBINDEX, TROPONINI in the last 168 hours.  BNP: Invalid input(s): POCBNP  CBG: Recent Labs  Lab 10/01/20 1720 10/01/20 2216 10/01/20 2327 10/02/20 0753 10/02/20 0821  GLUCAP 81 61* 77 53* 49    Microbiology: Results for orders placed or performed during the hospital encounter of 09/30/20  Respiratory Panel by RT PCR (Flu A&B, Covid) - Nasopharyngeal Swab     Status: None   Collection Time: 09/30/20  9:07 PM   Specimen: Nasopharyngeal Swab  Result Value Ref Range Status   SARS Coronavirus 2 by RT PCR NEGATIVE NEGATIVE Final    Comment: (NOTE) SARS-CoV-2 target nucleic acids are NOT DETECTED.  The SARS-CoV-2 RNA is generally detectable in upper respiratoy specimens during the acute phase of infection. The lowest concentration of SARS-CoV-2 viral copies this assay can detect is 131 copies/mL. A negative result does not preclude SARS-Cov-2 infection and  should not be used as the sole basis for treatment or other patient management decisions. A negative result may occur with  improper specimen collection/handling, submission of specimen other than nasopharyngeal swab, presence of viral mutation(s) within the areas targeted by this assay, and inadequate number of viral copies (<131 copies/mL). A negative result must be combined with clinical observations, patient history, and epidemiological information. The expected result is Negative.  Fact Sheet for Patients:  PinkCheek.be  Fact Sheet for Healthcare Providers:   GravelBags.it  This test is no t yet approved or cleared by the [place] FDA and  has been authorized for detection and/or diagnosis of SARS-CoV-2 by FDA under an Emergency Use Authorization (EUA). This EUA will remain  in effect (meaning this test can be used) for the duration of the COVID-19 declaration under Section 564(b)(1) of the Act, 21 U.S.C. section 360bbb-3(b)(1), unless the authorization is terminated or revoked sooner.     Influenza A by PCR NEGATIVE NEGATIVE Final   Influenza B by PCR NEGATIVE NEGATIVE Final    Comment: (NOTE) The Xpert Xpress SARS-CoV-2/FLU/RSV assay is intended as an aid in  the diagnosis of influenza from Nasopharyngeal swab specimens and  should not be used as a sole basis for treatment. Nasal washings and  aspirates are unacceptable for Xpert Xpress SARS-CoV-2/FLU/RSV  testing.  Fact Sheet for Patients: PinkCheek.be  Fact Sheet for Healthcare Providers: GravelBags.it  This test is not yet approved or cleared by the [place] FDA and  has been authorized for detection and/or diagnosis of SARS-CoV-2 by  FDA under an Emergency Use Authorization (EUA). This EUA will remain  in effect (meaning this test can be used) for the duration of the  Covid-19 declaration under Section 564(b)(1) of the Act, 21  U.S.C. section 360bbb-3(b)(1), unless the authorization is  terminated or revoked. Performed at [facility], [address] [place], [place] [address]     Coagulation Studies: No results for input(s): LABPROT, INR in the last 72 hours.  Urinalysis: Recent Labs    10/01/20 0316  COLORURINE YELLOW*  LABSPEC 1.008  PHURINE 5.0  GLUCOSEU NEGATIVE  HGBUR MODERATE*  BILIRUBINUR NEGATIVE  KETONESUR NEGATIVE  PROTEINUR 30*  NITRITE NEGATIVE  LEUKOCYTESUR NEGATIVE      Imaging: CT Abdomen Pelvis Wo Contrast  Result Date:  10/01/2020 CLINICAL DATA:  New renal failure. To rule out obstruction. Weakness in the leg starting a couple of days ago. EXAM: CT ABDOMEN AND PELVIS WITHOUT CONTRAST TECHNIQUE: Multidetector CT imaging of the abdomen and pelvis was performed following the standard protocol without IV contrast. COMPARISON:  MRCP 09/14/2020 FINDINGS: Lower chest: Atelectasis in the lung bases. Residual contrast material in the esophagus may represent reflux or dysmotility. No esophageal dilatation. Mild cardiac enlargement. Coronary artery calcifications. Hepatobiliary: Unenhanced images of the liver are unremarkable. No focal lesions identified. Gallbladder is surgically absent. No bile duct dilatation. Pancreas: Unremarkable. No pancreatic ductal dilatation or surrounding inflammatory changes. Spleen: Normal in size without focal abnormality. Adrenals/Urinary Tract: Left adrenal gland is surgically absent. Right adrenal gland nodule measuring 2.3 cm diameter. Density measurements are 1.4 Hounsfield units consistent with a benign adenoma. Surgical absence of the left kidney. Cyst on the right kidney. No hydronephrosis or hydroureter. No ureteral stones or bladder stones. No bladder wall thickening or filling defect. Stomach/Bowel: Postoperative changes consistent with gastric bypass. Fluid in the excluded stomach could arise from reflux or staple line dehiscence. Dehiscence is less likely as no contrast material is demonstrated. Small bowel  and colon are not abnormally distended. No inflammatory changes are identified. The appendix is normal. Vascular/Lymphatic: Aortic atherosclerosis. No enlarged abdominal or pelvic lymph nodes. Reproductive: Prostate is unremarkable. Other: Small amount of free fluid around the liver possibly representing ascites. No free air. Abdominal wall musculature appears intact. Musculoskeletal: Degenerative changes in the spine and hips. No destructive bone lesions. IMPRESSION: 1. No evidence of bowel  obstruction or inflammation. 2. Postoperative changes consistent with gastric bypass. Fluid in the excluded stomach could arise from reflux or staple line dehiscence. Dehiscence is less likely as no contrast material is demonstrated. 3. Small amount of free fluid around the liver possibly representing ascites. 4. Right adrenal gland nodule consistent with a benign adenoma. 5. 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 No right renal or ureteral stone or obstruction. 6. Residual contrast material in the esophagus may represent reflux or dysmotility. 7. Aortic atherosclerosis. Aortic Atherosclerosis (ICD10-I70.0). Electronically Signed   By: Lucienne Capers M.D.   On: 10/01/2020 01:07   DG Chest Port 1 View  Result Date: 09/30/2020 CLINICAL DATA:  74 year old male with cough. Lower extremity weakness. Former smoker. EXAM: PORTABLE CHEST 1 VIEW COMPARISON:  Portable chest 03/25/2020 and earlier. FINDINGS: Portable AP semi upright view at 2148 hours. Stable lung volumes and mediastinal contours. Cardiac size within normal limits. Visualized tracheal air column is within normal limits. Resolved patchy left lung opacity since April. Underlying increased pulmonary interstitium not significantly changed. No pneumothorax, pulmonary edema, pleural effusion or new pulmonary opacity. There is a new electronic device projecting over the right cardiophrenic angle of unclear significance. No acute osseous abnormality identified. IMPRESSION: No acute cardiopulmonary abnormality. Electronically Signed   By: Genevie Ann M.D.   On: 09/30/2020 21:57     Medications:    . apixaban  2.5 mg Oral BID  . carvedilol  6.25 mg Oral BID WC  . Chlorhexidine Gluconate Cloth  6 each Topical Q0600  . insulin aspart  0-15 Units Subcutaneous TID WC  . insulin aspart  0-5 Units Subcutaneous QHS  . mirtazapine  15 mg Oral QHS  . pantoprazole  40 mg Oral BID  . rOPINIRole  0.5 mg Oral BID  . sodium chloride flush  3 mL  Intravenous Q12H  . sucralfate  1 g Oral TID   acetaminophen **OR** acetaminophen, albuterol, albuterol, clonazePAM, guaiFENesin, ondansetron **OR** ondansetron (ZOFRAN) IV, oxyCODONE-acetaminophen **AND** oxyCODONE, sodium chloride flush  Assessment/ Plan:  [name] is a 74 y.o.  male [name] is 74 years old male with past medical history of diabetes, hypertension, GERD, renal cell carcinoma post left nephrectomy in 2004, COPD, pancreatic insufficiency, CKD stage III , anemia of CKD sent in to the ED by his PCP for abnormal renal function test and progressively worsening bilateral lower extremity weakness.  # Acute kidney injury on CKD stage IIIa #Metabolic acidosis Patient's baseline creatinine is 2.2/GFR 30 from 06/26/2020 Creatinine yesterday was 6.69, elevated to 8.12 today BUN 126 CO2 11  -Patient  has history of renal cell carcinoma with left nephrectomy in 2004 -Declining renal function, respiratory status, and fluid overload noted today - No urine output  - Planning for dialysis -Vascular team consulted for temporary catheter  -Dialysis orders placed -Hepatitis Panel ordered  #Hyperkalemia Potassium 5.8 on arrival Normalized, 4.3 today   #Anemia of CKD Hemoglobin 7.6 today We will continue monitoring closely  # Hypertension Blood pressure readings at low normal range  Carvedilol dose reduced to 6.25 today  #  Secondary Hyperparathyroidism Calcium 5.7 Phosphorus 10.4 on 10/01/20  #Hypoglycemia HbA1C on 09/30/20 4.9 Hypoglycemic episodes last night  Treated with IV Dextrose 50 % and oral glucose gel   # Fever -Started last night - Blood cultures in process -On IV Rocephin empirically  Chest Xray 10/01/20 IMPRESSION: No acute cardiopulmonary abnormality.           LOS: 2 [name] 10/19/202110:37 AM

## 2020-10-02 NOTE — Progress Notes (Signed)
Report given to Crystal RN by Karna Christmas RN in special recovery. Right femoral temp cath placed for HD without adverse reaction. HD called, do not need pt HD now, will return to his room.  Pt alert to name, made aware procedure complete and returning to room. Suctioned while in specials for thick tan mucus.

## 2020-10-02 NOTE — Consult Note (Signed)
[facility] Vascular Consult Note  MRN : [id number]  [name] is a 74 y.o. (1946-06-30) male who presents with chief complaint of  Chief Complaint  Patient presents with  . Weakness  . abnormal labs   History of Present Illness:  The patient is a 74 year old male multiple medical issues who presented to the [facility] emergency department with progressively worsening weakness.  Patient with known history of chronic kidney disease which is now progressed to acute renal failure.   The nephrology service has decided to initiate dialysis at this time, and we are asked to place a temporary dialysis catheter for immediate dialysis use.    Vascular surgery was consulted by Dr. Candiss Norse for temporary dialysis catheter insertion.  Current Facility-Administered Medications  Medication Dose Route Frequency Provider Last Rate Last Admin  . acetaminophen (TYLENOL) tablet 650 mg  650 mg Oral Q6H PRN Athena Masse, MD   650 mg at 10/02/20 0932   Or  . acetaminophen (TYLENOL) suppository 650 mg  650 mg Rectal Q6H PRN Athena Masse, MD      . albuterol (PROVENTIL) (2.5 MG/3ML) 0.083% nebulizer solution 2.5 mg  2.5 mg Nebulization Q6H PRN Jennye Boroughs, MD      . albuterol (VENTOLIN HFA) 108 (90 Base) MCG/ACT inhaler 2 puff  2 puff Inhalation Q6H PRN Jennye Boroughs, MD      . apixaban Arne Cleveland) tablet 2.5 mg  2.5 mg Oral BID Jennye Boroughs, MD   2.5 mg at 10/02/20 0931  . carvedilol (COREG) tablet 6.25 mg  6.25 mg Oral BID WC Jennye Boroughs, MD   6.25 mg at 10/02/20 0932  . Chlorhexidine Gluconate Cloth 2 % PADS 6 each  6 each Topical Q0600 Jennye Boroughs, MD      . clonazePAM Bobbye Charleston) tablet 0.5 mg  0.5 mg Oral BID PRN Jennye Boroughs, MD      . dextrose 10 % infusion   Intravenous Continuous Murlean Iba, MD 30 mL/hr at 10/02/20 1104 New Bag at 10/02/20 1104  . guaiFENesin (ROBITUSSIN) 100 MG/5ML solution 100 mg  5 mL Oral Q4H PRN Jennye Boroughs, MD   100 mg at 10/01/20 1631  . insulin aspart (novoLOG) injection 0-15 Units  0-15 Units Subcutaneous TID WC Athena Masse, MD   2 Units at 10/01/20 0759  . insulin aspart (novoLOG) injection 0-5 Units  0-5 Units Subcutaneous QHS Judd Gaudier V, MD      . mirtazapine (REMERON) tablet 15 mg  15 mg Oral QHS Jennye Boroughs, MD   15 mg at 10/01/20 2107  . ondansetron (ZOFRAN) tablet 4 mg  4 mg Oral Q6H PRN Athena Masse, MD       Or  . ondansetron [facility]) injection 4 mg  4 mg Intravenous Q6H PRN Athena Masse, MD      . oxyCODONE-acetaminophen (PERCOCET/ROXICET) 5-325 MG per tablet 1 tablet  1 tablet Oral Q8H PRN Hart Robinsons A, RPH   1 tablet at 10/01/20 1549   And  . oxyCODONE (Oxy IR/ROXICODONE) immediate release tablet 5 mg  5 mg Oral Q8H PRN Hart Robinsons A, RPH      . pantoprazole (PROTONIX) EC tablet 40 mg  40 mg Oral BID Jennye Boroughs, MD   40 mg at 10/02/20 0932  . rOPINIRole (REQUIP) tablet 0.5 mg  0.5 mg Oral BID Jennye Boroughs, MD   0.5 mg at 10/02/20 0931  . sodium chloride flush (NS) 0.9 % injection 3  mL  3 mL Intravenous Q12H Naaman Plummer, MD   3 mL at 10/02/20 0946  . sodium chloride flush (NS) 0.9 % injection 3 mL  3 mL Intravenous PRN Naaman Plummer, MD      . sucralfate (CARAFATE) tablet 1 g  1 g Oral TID Jennye Boroughs, MD   1 g at 10/02/20 3419   Past Medical History:  Diagnosis Date  . Chronic back pain   . COPD (chronic obstructive pulmonary disease) ([place])   . Depression   . Diabetes mellitus without complication ([place])   . Hyperlipidemia   . Hypertension   . Renal cell carcinoma ([place])   . Sleep apnea    Past Surgical History:  Procedure Laterality Date  . CARPAL TUNNEL RELEASE    . CHOLECYSTECTOMY    . COLONOSCOPY WITH PROPOFOL N/A 07/29/2017   Procedure: COLONOSCOPY WITH PROPOFOL;  Surgeon: Manya Silvas, MD;  Location: [facility] ENDOSCOPY;  Service: Endoscopy;  Laterality: N/A;  . colonoscopys    . ESOPHAGOGASTRODUODENOSCOPY (EGD) WITH PROPOFOL  N/A 07/29/2017   Procedure: ESOPHAGOGASTRODUODENOSCOPY (EGD) WITH PROPOFOL;  Surgeon: Manya Silvas, MD;  Location: [facility] ENDOSCOPY;  Service: Endoscopy;  Laterality: N/A;  . FLEXIBLE SIGMOIDOSCOPY    . GASTRIC BYPASS    . NEPHRECTOMY    . open reduction and internal fixation, right distal radius    . right knee arthroscopy    . UPPER GI ENDOSCOPY     Social History Social History   Tobacco Use  . Smoking status: Former Smoker    Packs/day: 1.00    Years: 30.00    Pack years: 30.00  . Smokeless tobacco: Current User    Types: Chew  Vaping Use  . Vaping Use: Never used  Substance Use Topics  . Alcohol use: No  . Drug use: No   Family History Family History  Problem Relation Age of Onset  . Diabetes Mother   . Cancer Mother   . Heart disease Father   Denies family history of peripheral artery disease, venous disease or renal disease.  Allergies  Allergen Reactions  . Bupropion     Other reaction(s): Other (See Comments) Mood swings, angry   REVIEW OF SYSTEMS (Negative unless checked)  Constitutional: [] Weight loss  [] Fever  [] Chills Cardiac: [] Chest pain   [] Chest pressure   [] Palpitations   [] Shortness of breath when laying flat   [] Shortness of breath at rest   [] Shortness of breath with exertion. Vascular:  [] Pain in legs with walking   [] Pain in legs at rest   [] Pain in legs when laying flat   [] Claudication   [] Pain in feet when walking  [] Pain in feet at rest  [] Pain in feet when laying flat   [] History of DVT   [] Phlebitis   [x] Swelling in legs   [] Varicose veins   [] Non-healing ulcers Pulmonary:   [] Uses home oxygen   [] Productive cough   [] Hemoptysis   [] Wheeze  [] COPD   [] Asthma Neurologic:  [] Dizziness  [] Blackouts   [] Seizures   [] History of stroke   [] History of TIA  [] Aphasia   [] Temporary blindness   [] Dysphagia   [] Weakness or numbness in arms   [] Weakness or numbness in legs Musculoskeletal:  [] Arthritis   [] Joint swelling   [] Joint pain   [] Low back  pain Hematologic:  [] Easy bruising  [] Easy bleeding   [] Hypercoagulable state   [x] Anemic  [] Hepatitis Gastrointestinal:  [] Blood in stool   [] Vomiting blood  [] Gastroesophageal reflux/heartburn   [] Difficulty swallowing.  Genitourinary:  [x] Chronic kidney disease   [] Difficult urination  [] Frequent urination  [] Burning with urination   [] Blood in urine Skin:  [] Rashes   [] Ulcers   [] Wounds Psychological:  [] History of anxiety   []  History of major depression.  Physical Examination  Vitals:   10/02/20 0400 10/02/20 0500 10/02/20 0925 10/02/20 1101  BP: 126/60  (!) 151/73 113/69  Pulse: (!) 103  (!) 103 100  Resp: 20 17 18 20   Temp: 99.1 F (37.3 C)  (!) 100.6 F (38.1 C) 99.5 F (37.5 C)  TempSrc: Oral  Oral Oral  SpO2: 91%  95% 96%  Weight: 117.5 kg     Height:       Body mass index is 35.13 kg/m. Gen: WD/WN Head: [place]/AT, No temporalis wasting Ear/Nose/Throat: Hearing grossly intact, nares w/o erythema or drainage Eyes: Sclera non-icteric, conjunctiva clear Neck: Supple, no nuchal rigidity.  No JVD.  Pulmonary:  Good air movement, clear to auscultation bilaterally.  Cardiac: RRR, normal S1, S2, no Murmurs, rubs or gallops. Vascular: Extremities are warm distally.  Good capillary refill. Gastrointestinal: soft, non-tender/non-distended. No guarding/reflex.  Musculoskeletal: M/S 5/5 throughout.  Extremities without ischemic changes.  No deformity or atrophy.  Moderate edema in the lower extremities bilaterally Neurologic: Patient seems lethargic and confused at times Psychiatric: Difficult to assess due to the severity of patient's illness. Dermatologic: No rashes or ulcers noted.   Lymph : No Cervical, Axillary, or Inguinal lymphadenopathy.  CBC Lab Results  Component Value Date   WBC 9.2 10/02/2020   HGB 7.6 (L) 10/02/2020   HCT 23.9 (L) 10/02/2020   MCV 96.8 10/02/2020   PLT 188 10/02/2020      Component Value Date/Time   NA 142 10/02/2020 0528   NA 142 01/10/2015  1626   K 4.3 10/02/2020 0528   K 4.3 01/10/2015 1626   CL 112 (H) 10/02/2020 0528   CL 110 (H) 01/10/2015 1626   CO2 11 (L) 10/02/2020 0528   CO2 25 01/10/2015 1626   GLUCOSE 61 (L) 10/02/2020 0528   GLUCOSE 144 (H) 01/10/2015 1626   BUN 126 (H) 10/02/2020 0528   BUN 24 (H) 01/10/2015 1626   CREATININE 8.12 (H) 10/02/2020 0528   CREATININE 1.13 01/10/2015 1626   CALCIUM 5.7 (LL) 10/02/2020 0528   CALCIUM 9.2 01/10/2015 1626   GFRNONAA 6 (L) 10/02/2020 0528   GFRNONAA >60 01/10/2015 1626   GFRNONAA >60 06/24/2014 0538   GFRAA 47 (L) 03/26/2020 0527   GFRAA >60 01/10/2015 1626   GFRAA >60 06/24/2014 0538   Estimated Creatinine Clearance: 11 mL/min (A) (by C-G formula based on SCr of 8.12 mg/dL (H)).  COAG Lab Results  Component Value Date   INR 1.1 01/11/2020   INR 1.1 06/22/2014   INR 1.2 02/13/2014   Radiology CT Abdomen Pelvis Wo Contrast  Result Date: 10/01/2020 CLINICAL DATA:  New renal failure. To rule out obstruction. Weakness in the leg starting a couple of days ago. EXAM: CT ABDOMEN AND PELVIS WITHOUT CONTRAST TECHNIQUE: Multidetector CT imaging of the abdomen and pelvis was performed following the standard protocol without IV contrast. COMPARISON:  MRCP 09/14/2020 FINDINGS: Lower chest: Atelectasis in the lung bases. Residual contrast material in the esophagus may represent reflux or dysmotility. No esophageal dilatation. Mild cardiac enlargement. Coronary artery calcifications. Hepatobiliary: Unenhanced images of the liver are unremarkable. No focal lesions identified. Gallbladder is surgically absent. No bile duct dilatation. Pancreas: Unremarkable. No pancreatic ductal dilatation or surrounding inflammatory changes. Spleen: Normal in size  without focal abnormality. Adrenals/Urinary Tract: Left adrenal gland is surgically absent. Right adrenal gland nodule measuring 2.3 cm diameter. Density measurements are 1.4 Hounsfield units consistent with a benign adenoma. Surgical  absence of the left kidney. Cyst on the right kidney. No hydronephrosis or hydroureter. No ureteral stones or bladder stones. No bladder wall thickening or filling defect. Stomach/Bowel: Postoperative changes consistent with gastric bypass. Fluid in the excluded stomach could arise from reflux or staple line dehiscence. Dehiscence is less likely as no contrast material is demonstrated. Small bowel and colon are not abnormally distended. No inflammatory changes are identified. The appendix is normal. Vascular/Lymphatic: Aortic atherosclerosis. No enlarged abdominal or pelvic lymph nodes. Reproductive: Prostate is unremarkable. Other: Small amount of free fluid around the liver possibly representing ascites. No free air. Abdominal wall musculature appears intact. Musculoskeletal: Degenerative changes in the spine and hips. No destructive bone lesions. IMPRESSION: 1. No evidence of bowel obstruction or inflammation. 2. Postoperative changes consistent with gastric bypass. Fluid in the excluded stomach could arise from reflux or staple line dehiscence. Dehiscence is less likely as no contrast material is demonstrated. 3. Small amount of free fluid around the liver possibly representing ascites. 4. Right adrenal gland nodule consistent with a benign adenoma. 5. 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 No right renal or ureteral stone or obstruction. 6. Residual contrast material in the esophagus may represent reflux or dysmotility. 7. Aortic atherosclerosis. Aortic Atherosclerosis (ICD10-I70.0). Electronically Signed   By: Lucienne Capers M.D.   On: 10/01/2020 01:07   MR ABDOMEN MRCP WO CONTRAST  Result Date: 09/14/2020 CLINICAL DATA:  Nausea, diarrhea, occasional vomiting for 6 months, history of cholecystectomy EXAM: MRI ABDOMEN WITHOUT CONTRAST  (INCLUDING MRCP) TECHNIQUE: Multiplanar multisequence MR imaging of the abdomen was performed. Heavily T2-weighted images of the biliary and pancreatic  ducts were obtained, and three-dimensional MRCP images were rendered by post processing. COMPARISON:  MR abdomen, 01/18/2012 FINDINGS: Lower chest: No acute findings. Hepatobiliary: No mass or other parenchymal abnormality identified. Status post cholecystectomy. There is mild intra and extrahepatic biliary ductal dilatation, the central common bile duct measuring up to 1.0 cm. Pancreas: No mass, inflammatory changes, or other parenchymal abnormality identified. Mild dilatation of the pancreatic duct to the ampulla, measuring up to 6 mm centrally. Spleen:  Within normal limits in size and appearance. Adrenals/Urinary Tract: Status post left nephrectomy. Right renal cysts. No masses identified. No evidence of hydronephrosis. Stomach/Bowel: Status post Roux type gastric bypass. Visualized portions within the abdomen are otherwise unremarkable. Vascular/Lymphatic: No pathologically enlarged lymph nodes identified. No abdominal aortic aneurysm demonstrated. Other:  None. Musculoskeletal: No suspicious bone lesions identified. IMPRESSION: 1. Status post cholecystectomy with mild intra- and extrahepatic biliary ductal dilatation, the central common bile duct measuring up to 1.0 cm. This is likely postoperative in the absence of biochemical evidence of ductal obstruction. No noncontrast evidence of post cholecystectomy choledocholithiasis or other obstructing lesion. 2. Mild dilatation of the pancreatic duct to the ampulla, measuring up to 6 mm centrally, nonspecific and again without noncontrast evidence of obstructing mass or other lesion. 3. Status post left nephrectomy. 4. Status post Roux type gastric bypass. Electronically Signed   By: Eddie Candle M.D.   On: 09/14/2020 10:21   MR 3D Recon At Scanner  Result Date: 09/14/2020 CLINICAL DATA:  Nausea, diarrhea, occasional vomiting for 6 months, history of cholecystectomy EXAM: MRI ABDOMEN WITHOUT CONTRAST  (INCLUDING MRCP) TECHNIQUE: Multiplanar multisequence MR  imaging of the abdomen was performed. Heavily T2-weighted  images of the biliary and pancreatic ducts were obtained, and three-dimensional MRCP images were rendered by post processing. COMPARISON:  MR abdomen, 01/18/2012 FINDINGS: Lower chest: No acute findings. Hepatobiliary: No mass or other parenchymal abnormality identified. Status post cholecystectomy. There is mild intra and extrahepatic biliary ductal dilatation, the central common bile duct measuring up to 1.0 cm. Pancreas: No mass, inflammatory changes, or other parenchymal abnormality identified. Mild dilatation of the pancreatic duct to the ampulla, measuring up to 6 mm centrally. Spleen:  Within normal limits in size and appearance. Adrenals/Urinary Tract: Status post left nephrectomy. Right renal cysts. No masses identified. No evidence of hydronephrosis. Stomach/Bowel: Status post Roux type gastric bypass. Visualized portions within the abdomen are otherwise unremarkable. Vascular/Lymphatic: No pathologically enlarged lymph nodes identified. No abdominal aortic aneurysm demonstrated. Other:  None. Musculoskeletal: No suspicious bone lesions identified. IMPRESSION: 1. Status post cholecystectomy with mild intra- and extrahepatic biliary ductal dilatation, the central common bile duct measuring up to 1.0 cm. This is likely postoperative in the absence of biochemical evidence of ductal obstruction. No noncontrast evidence of post cholecystectomy choledocholithiasis or other obstructing lesion. 2. Mild dilatation of the pancreatic duct to the ampulla, measuring up to 6 mm centrally, nonspecific and again without noncontrast evidence of obstructing mass or other lesion. 3. Status post left nephrectomy. 4. Status post Roux type gastric bypass. Electronically Signed   By: Eddie Candle M.D.   On: 09/14/2020 10:21   DG Chest Port 1 View  Result Date: 09/30/2020 CLINICAL DATA:  74 year old male with cough. Lower extremity weakness. Former smoker. EXAM:  PORTABLE CHEST 1 VIEW COMPARISON:  Portable chest 03/25/2020 and earlier. FINDINGS: Portable AP semi upright view at 2148 hours. Stable lung volumes and mediastinal contours. Cardiac size within normal limits. Visualized tracheal air column is within normal limits. Resolved patchy left lung opacity since April. Underlying increased pulmonary interstitium not significantly changed. No pneumothorax, pulmonary edema, pleural effusion or new pulmonary opacity. There is a new electronic device projecting over the right cardiophrenic angle of unclear significance. No acute osseous abnormality identified. IMPRESSION: No acute cardiopulmonary abnormality. Electronically Signed   By: Genevie Ann M.D.   On: 09/30/2020 21:57   Assessment/Plan The patient is a 74 year old male multiple medical issues who presented to the [facility] emergency department with progressively worsening weakness.  1.  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  At this time, nephrology would like to initiate dialysis however he does not have an appropriate access. We will proceed with temporary dialysis catheter placement at this time.  Risks and benefits discussed with patient and/or family, and the catheter will be placed to allow immediate initiation of dialysis.  If the patient's renal function does not improve throughout the hospital course, we will be happy to place a tunneled dialysis catheter for long term use prior to discharge.    2.  Diabetes: On appropriate medications Encouraged good control as its slows the progression of atherosclerotic disease  3.  Hyperlipidemia: ?  Aspirin and statin for medical management Encouraged good control as its slows the progression of atherosclerotic disease  Discussed with Dr. Clancy Gourd, PA-C  10/02/2020 12:00 PM

## 2020-10-02 NOTE — Progress Notes (Addendum)
Hypoglycemic Event  CBG: 55  Treatment: D50 25 mL (12.5 gm)  Symptoms: None  Follow-up CBG: Time:1412 CBG Result:87  Possible Reasons for Event: Inadequate meal intake  Comments/MD notified: currently on d10@ 30 cc/hr, made dr. Candiss Norse and dr Mal Misty aware sugar still dropping while on fluids Will continue to monitor    [facility]

## 2020-10-02 NOTE — Progress Notes (Signed)
CRITICAL VALUE ALERT  Critical Value:  5.8 calcium   Date & Time Notied: 10/02/20 1439  Provider Notified: dr. Mal Misty  Orders Received/Actions taken: waiting for md to return page

## 2020-10-02 NOTE — Progress Notes (Signed)
Mobility Specialist - Progress Note   10/02/20 1200  Mobility  Activity Contraindicated/medical hold  Mobility performed by Mobility specialist    Per discussion with nurse, pt is not medically appropriate to see today d/t HD treatment and ongoing health concerns this morning. Will attempt session at another date/time.    Kathee Delton Mobility Specialist 10/02/20, 12:13 PM

## 2020-10-02 NOTE — Progress Notes (Signed)
Made Dr. Mal Misty aware patient had no output last night, bun and creatinine is worsening. Lungs coarse. Sugar this am is 53, hypoglycemic order set used, refer to orders. Will continue to monitor closely

## 2020-10-02 NOTE — Op Note (Signed)
  OPERATIVE NOTE   PROCEDURE: 1. Insertion of temporary dialysis catheter catheter right common femoral vein approach.  PRE-OPERATIVE DIAGNOSIS: Acute kidney injury  POST-OPERATIVE DIAGNOSIS: Same  SURGEON: Katha Cabal M.D.  ANESTHESIA: 1% lidocaine local infiltration  ESTIMATED BLOOD LOSS: Minimal cc  INDICATIONS:   [name] is a 74 y.o. male who presents with acute kidney injury with multisystem organ dysfunction.  He will not require hemodialysis and therefore a temporary catheter is being placed..  DESCRIPTION: After obtaining full informed written consent, the patient was positioned supine. The right groin was prepped and draped in a sterile fashion. Ultrasound was placed in a sterile sleeve. Ultrasound was utilized to identify the right common femoral  vein which is noted to be echolucent and compressible indicating patency. Images recorded for the permanent record. Under real-time visualization a Seldinger needle is inserted into the vein and the guidewires advanced without difficulty. Small counterincision was made at the wire insertion site. Dilator is passed over the wire and the temporary dialysis catheter catheter is fed over the wire without difficulty.  All lumens aspirate and flush easily and are packed with heparin saline. Catheter secured to the skin of the right thigh with 2-0 silk. A sterile dressing is applied with Biopatch.  COMPLICATIONS: None  CONDITION: Unchanged  Hortencia Pilar Office:  [phone] 10/02/2020, 6:39 PM

## 2020-10-02 NOTE — Progress Notes (Addendum)
Progress Note    [name]  HUD:[id number] DOB: 08-25-1946  DOA: 09/30/2020 PCP: Derinda Late, MD      Brief Narrative:    Medical records reviewed and are as summarized below:  [name] is a 74 y.o. 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  He was referred to the emergency room by his PCP because of lower extremity weakness and acute kidney injury.  He said his legs felt like "spaghetti".  He was found to have acute kidney injury complicated by hyperkalemia, metabolic acidosis, uremic encephalopathy, myoclonic jerks, hyperphosphatemia and hypocalcemia.  He was treated with IV sodium bicarbonate infusion.  Nephrologist was consulted to assist with management.  Unfortunately, kidney function worsened.  There was concern for fluid overload.  He also has acute hypoxemic respiratory failure requiring oxygen via nasal cannula.  He probably has underlying chronic hypoxemic respiratory failure as well.  Vascular surgeon was consulted for temporary dialysis catheter for hemodialysis.     Assessment/Plan:   Principal Problem:   AKI (acute kidney injury) ([place]) Active Problems:   Essential hypertension   Type II diabetes mellitus with renal manifestations (HCC)   Anemia in chronic kidney disease   Spinal stenosis, lumbar region, with neurogenic claudication   Hyperkalemia   COPD (chronic obstructive pulmonary disease) ([place])   CKD stage 3 due to type 2 diabetes mellitus ([place])   History of renal cell carcinoma   History of left nephrectomy 2004   Hypoglycemia   Pancreatic insufficiency   Chronic diarrhea   Metabolic acidosis   Body mass index is 35.13 kg/m.      PLAN  AKI on CKD stage IIIb complicated by hyperkalemia and metabolic acidosis/history of left nephrectomy/myoclonic jerks: Discontinue IV sodium bicarbonate infusion because of concern for fluid overload.  Vascular surgeon has been consulted for temporary dialysis catheter placement so patient could possibly have hemodialysis later today.  Case discussed with nephrologist, Dr. Candiss Norse.  Discontinue Remeron and Requip.  Gabapentin and Cymbalta were discontinued on 10/01/2020.  Hyperphosphatemia and hypocalcemia: Patient was given IV calcium gluconate earlier today.  However, calcium level remains low.  Check albumin level to estimate corrected calcium level.  Continue to monitor levels.  Acute metabolic encephalopathy/uremic encephalopathy: Supportive care.  Plan for hemodialysis today.  Fever: Etiology is unclear.  It may be due to myoclonic jerks.  Urinalysis on admission was negative for infection.  Repeat chest x-ray.  Blood cultures have been ordered.  Procalcitonin and lactic acid have been ordered but this may be difficult to interpret as they may likely be elevated in the setting of worsening AKI.  Start IV Rocephin empirically  Acute hypoxemic respiratory failure, probably underlying chronic hypoxia: Continue 4 L/min oxygen via nasal cannula.  Hypertension: Continue carvedilol  History of DVT and PE: Continue Eliquis  Type II DM with recurrent hypoglycemia: Blood sugar dropped to 53 this morning.  He has been started on D10 infusion at 30 cc/h to treat and prevent hypoglycemia.  Glipizide was held on admission.  Anemia of chronic kidney disease: H&H is stable.  Continue to monitor.  Chronic pancreatitis/chronic diarrhea: Continue Creon  COPD: Continue bronchodilators as needed  Chronic back pain: Analgesics as needed for pain.  Depression: Remeron and  Cymbalta has been discontinued because of AKI  Diet Order            Diet heart healthy/carb modified Room service  appropriate? Yes; Fluid consistency: Thin  Diet effective now                    Consultants:  Nephrologist   Procedures:  None    Medications:   . apixaban  2.5 mg Oral BID  . carvedilol  6.25 mg Oral BID WC  . Chlorhexidine Gluconate Cloth  6 each Topical Q0600  . insulin aspart  0-15 Units Subcutaneous TID WC  . insulin aspart  0-5 Units Subcutaneous QHS  . pantoprazole  40 mg Oral BID  . sodium chloride flush  3 mL Intravenous Q12H  . sucralfate  1 g Oral TID   Continuous Infusions: . dextrose 30 mL/hr at 10/02/20 1104     Anti-infectives (From admission, onward)   None             Family Communication/Anticipated D/C date and plan/Code Status   DVT prophylaxis: apixaban (ELIQUIS) tablet 2.5 mg Start: 10/02/20 1000 apixaban (ELIQUIS) tablet 2.5 mg     Code Status: Full Code  Family Communication: Plan discussed with his wife at the bedside Disposition Plan:    Status is: Inpatient  Remains inpatient appropriate because:IV treatments appropriate due to intensity of illness or inability to take PO and Inpatient level of care appropriate due to severity of illness   Dispo: The patient is from: Home              Anticipated d/c is to: Home              Anticipated d/c date is: > 3 days              Patient currently is not medically stable to d/c.           Subjective:   Interval events noted.  Patient has been more confused.  He is hypoxic.  He had fever with T-max of 100.6.  Objective:    Vitals:   10/02/20 0400 10/02/20 0500 10/02/20 0925 10/02/20 1101  BP: 126/60  (!) 151/73 113/69  Pulse: (!) 103  (!) 103 100  Resp: 20 17 18 20   Temp: 99.1 F (37.3 C)  (!) 100.6 F (38.1 C) 99.5 F (37.5 C)  TempSrc: Oral  Oral Oral  SpO2: 91%  95% 96%  Weight: 117.5 kg     Height:       No data found.   Intake/Output Summary (Last 24 hours) at 10/02/2020 1202 Last data filed at 10/02/2020 1030 Gross per 24 hour  Intake  2045.75 ml  Output 0 ml  Net 2045.75 ml   Filed Weights   09/30/20 1658 10/01/20 0910 10/02/20 0400  Weight: 113.4 kg 116.6 kg 117.5 kg    Exam:   GEN: NAD SKIN: Warm and dry EYES: No pallor or icterus ENT: MMM CV: RRR PULM: Bilateral rales. ABD: soft, obese, NT, +BS CNS: Alert and oriented to person only.  He is confused.  No focal deficits.  He has myoclonic jerks mainly in the upper extremities EXT: No edema or tenderness     Data Reviewed:   I have personally reviewed following labs and imaging studies:  Labs: Labs show the following:   Basic Metabolic Panel: Recent Labs  Lab 09/30/20 1953 09/30/20 1953 10/01/20 0316 10/01/20 0321 10/02/20 0528  NA 141  --   --  140 142  K 5.8*   < >  --  5.2* 4.3  CL 116*  --   --  114* 112*  CO2 12*  --   --  16* 11*  GLUCOSE 66*  --   --  198* 61*  BUN 112*  --   --  108* 126*  CREATININE 6.55*  --   --  6.69* 8.12*  CALCIUM 6.8*  --   --  6.5* 5.7*  MG  --   --  1.6*  --   --   PHOS  --   --  10.4*  --   --    < > = values in this interval not displayed.   GFR Estimated Creatinine Clearance: 11 mL/min (A) (by C-G formula based on SCr of 8.12 mg/dL (H)). Liver Function Tests: No results for input(s): AST, ALT, ALKPHOS, BILITOT, PROT, ALBUMIN in the last 168 hours. No results for input(s): LIPASE, AMYLASE in the last 168 hours. No results for input(s): AMMONIA in the last 168 hours. Coagulation profile No results for input(s): INR, PROTIME in the last 168 hours.  CBC: Recent Labs  Lab 09/30/20 1953 10/01/20 0321 10/02/20 0528  WBC 12.0* 13.6* 9.2  HGB 9.7* 8.4* 7.6*  HCT 30.0* 26.1* 23.9*  MCV 97.1 97.4 96.8  PLT 198 220 188   Cardiac Enzymes: No results for input(s): CKTOTAL, CKMB, CKMBINDEX, TROPONINI in the last 168 hours. BNP (last 3 results) No results for input(s): PROBNP in the last 8760 hours. CBG: Recent Labs  Lab 10/01/20 1720 10/01/20 2216 10/01/20 2327 10/02/20 0753 10/02/20 0821    GLUCAP 81 61* 77 53* 89   D-Dimer: No results for input(s): DDIMER in the last 72 hours. Hgb A1c: Recent Labs    09/30/20 1953  HGBA1C 4.9   Lipid Profile: No results for input(s): CHOL, HDL, LDLCALC, TRIG, CHOLHDL, LDLDIRECT in the last 72 hours. Thyroid function studies: No results for input(s): TSH, T4TOTAL, T3FREE, THYROIDAB in the last 72 hours.  Invalid input(s): FREET3 Anemia work up: No results for input(s): VITAMINB12, FOLATE, FERRITIN, TIBC, IRON, RETICCTPCT in the last 72 hours. Sepsis Labs: Recent Labs  Lab 09/30/20 1953 10/01/20 0321 10/02/20 0528  WBC 12.0* 13.6* 9.2    Microbiology Recent Results (from the past 240 hour(s))  Respiratory Panel by RT PCR (Flu A&B, Covid) - Nasopharyngeal Swab     Status: None   Collection Time: 09/30/20  9:07 PM   Specimen: Nasopharyngeal Swab  Result Value Ref Range Status   SARS Coronavirus 2 by RT PCR NEGATIVE NEGATIVE Final    Comment: (NOTE) SARS-CoV-2 target nucleic acids are NOT DETECTED.  The SARS-CoV-2 RNA is generally detectable in upper respiratoy specimens during the acute phase of infection. The lowest concentration of SARS-CoV-2 viral copies this assay can detect is 131 copies/mL. A negative result does not preclude SARS-Cov-2 infection and should not be used as the sole basis for treatment or other patient management decisions. A negative result may occur with  improper specimen collection/handling, submission of specimen other than nasopharyngeal swab, presence of viral mutation(s) within the areas targeted by this assay, and inadequate number of viral copies (<131 copies/mL). A negative result must be combined with clinical observations, patient history, and epidemiological information. The expected result is Negative.  Fact Sheet for Patients:  PinkCheek.be  Fact Sheet for Healthcare Providers:  GravelBags.it  This test is no t yet  approved or cleared by the [place] and  has been authorized  for detection and/or diagnosis of SARS-CoV-2 by FDA under an Emergency Use Authorization (EUA). This EUA will remain  in effect (meaning this test can be used) for the duration of the COVID-19 declaration under Section 564(b)(1) of the Act, 21 U.S.C. section 360bbb-3(b)(1), unless the authorization is terminated or revoked sooner.     Influenza A by PCR NEGATIVE NEGATIVE Final   Influenza B by PCR NEGATIVE NEGATIVE Final    Comment: (NOTE) The Xpert Xpress SARS-CoV-2/FLU/RSV assay is intended as an aid in  the diagnosis of influenza from Nasopharyngeal swab specimens and  should not be used as a sole basis for treatment. Nasal washings and  aspirates are unacceptable for Xpert Xpress SARS-CoV-2/FLU/RSV  testing.  Fact Sheet for Patients: PinkCheek.be  Fact Sheet for Healthcare Providers: GravelBags.it  This test is not yet approved or cleared by the [place] FDA and  has been authorized for detection and/or diagnosis of SARS-CoV-2 by  FDA under an Emergency Use Authorization (EUA). This EUA will remain  in effect (meaning this test can be used) for the duration of the  Covid-19 declaration under Section 564(b)(1) of the Act, 21  U.S.C. section 360bbb-3(b)(1), unless the authorization is  terminated or revoked. Performed at [facility], [address], [place], [place] [address]     Procedures and diagnostic studies:  CT Abdomen Pelvis Wo Contrast  Result Date: 10/01/2020 CLINICAL DATA:  New renal failure. To rule out obstruction. Weakness in the leg starting a couple of days ago. EXAM: CT ABDOMEN AND PELVIS WITHOUT CONTRAST TECHNIQUE: Multidetector CT imaging of the abdomen and pelvis was performed following the standard protocol without IV contrast. COMPARISON:  MRCP 09/14/2020 FINDINGS: Lower chest: Atelectasis in the lung bases.  Residual contrast material in the esophagus may represent reflux or dysmotility. No esophageal dilatation. Mild cardiac enlargement. Coronary artery calcifications. Hepatobiliary: Unenhanced images of the liver are unremarkable. No focal lesions identified. Gallbladder is surgically absent. No bile duct dilatation. Pancreas: Unremarkable. No pancreatic ductal dilatation or surrounding inflammatory changes. Spleen: Normal in size without focal abnormality. Adrenals/Urinary Tract: Left adrenal gland is surgically absent. Right adrenal gland nodule measuring 2.3 cm diameter. Density measurements are 1.4 Hounsfield units consistent with a benign adenoma. Surgical absence of the left kidney. Cyst on the right kidney. No hydronephrosis or hydroureter. No ureteral stones or bladder stones. No bladder wall thickening or filling defect. Stomach/Bowel: Postoperative changes consistent with gastric bypass. Fluid in the excluded stomach could arise from reflux or staple line dehiscence. Dehiscence is less likely as no contrast material is demonstrated. Small bowel and colon are not abnormally distended. No inflammatory changes are identified. The appendix is normal. Vascular/Lymphatic: Aortic atherosclerosis. No enlarged abdominal or pelvic lymph nodes. Reproductive: Prostate is unremarkable. Other: Small amount of free fluid around the liver possibly representing ascites. No free air. Abdominal wall musculature appears intact. Musculoskeletal: Degenerative changes in the spine and hips. No destructive bone lesions. IMPRESSION: 1. No evidence of bowel obstruction or inflammation. 2. Postoperative changes consistent with gastric bypass. Fluid in the excluded stomach could arise from reflux or staple line dehiscence. Dehiscence is less likely as no contrast material is demonstrated. 3. Small amount of free fluid around the liver possibly representing ascites. 4. Right adrenal gland nodule consistent with a benign adenoma. 5.  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 No right renal or ureteral stone or obstruction. 6. Residual contrast material in the esophagus may represent reflux or dysmotility. 7. Aortic atherosclerosis. Aortic Atherosclerosis (  ICD10-I70.0). Electronically Signed   By: Lucienne Capers M.D.   On: 10/01/2020 01:07   DG Chest Port 1 View  Result Date: 09/30/2020 CLINICAL DATA:  74 year old male with cough. Lower extremity weakness. Former smoker. EXAM: PORTABLE CHEST 1 VIEW COMPARISON:  Portable chest 03/25/2020 and earlier. FINDINGS: Portable AP semi upright view at 2148 hours. Stable lung volumes and mediastinal contours. Cardiac size within normal limits. Visualized tracheal air column is within normal limits. Resolved patchy left lung opacity since April. Underlying increased pulmonary interstitium not significantly changed. No pneumothorax, pulmonary edema, pleural effusion or new pulmonary opacity. There is a new electronic device projecting over the right cardiophrenic angle of unclear significance. No acute osseous abnormality identified. IMPRESSION: No acute cardiopulmonary abnormality. Electronically Signed   By: Genevie Ann M.D.   On: 09/30/2020 21:57               LOS: 2 days   Abdulkadir Emmanuel  [facility]   Pager on [email or web address]. If 7PM-7AM, please contact night-coverage at [email or web address]     10/02/2020, 12:02 PM

## 2020-10-02 NOTE — Progress Notes (Signed)
Per dr. Mal Misty keep D10 @30ml /hr. New order place for iv calcium gluconate.

## 2020-10-02 NOTE — Progress Notes (Signed)
Hypoglycemic Event  CBG: 53  Treatment: D50 25 mL (12.5 gm)  Symptoms: None  Follow-up CBG: Time:0820 CBG Result:89  Possible Reasons for Event: Inadequate meal intake  Comments/MD notified:made dr. Mal Misty aware of low sugar    Fionna Merriott Sinclair Grooms

## 2020-10-02 NOTE — Progress Notes (Signed)
CRITICAL VALUE ALERT  Critical Value:  Ca+ = 5.7  Date & Time Notied:  10/02/20  0973  Provider Notified: Damita Dunnings  Orders Received/Actions taken: awaiting response

## 2020-10-03 ENCOUNTER — Inpatient Hospital Stay: Payer: Medicare Other

## 2020-10-03 ENCOUNTER — Encounter: Payer: Self-pay | Admitting: Vascular Surgery

## 2020-10-03 DIAGNOSIS — J449 Chronic obstructive pulmonary disease, unspecified: Secondary | ICD-10-CM

## 2020-10-03 DIAGNOSIS — Z85528 Personal history of other malignant neoplasm of kidney: Secondary | ICD-10-CM

## 2020-10-03 DIAGNOSIS — E1122 Type 2 diabetes mellitus with diabetic chronic kidney disease: Secondary | ICD-10-CM | POA: Diagnosis not present

## 2020-10-03 DIAGNOSIS — K529 Noninfective gastroenteritis and colitis, unspecified: Secondary | ICD-10-CM | POA: Diagnosis not present

## 2020-10-03 DIAGNOSIS — N1832 Chronic kidney disease, stage 3b: Secondary | ICD-10-CM | POA: Diagnosis not present

## 2020-10-03 DIAGNOSIS — M48062 Spinal stenosis, lumbar region with neurogenic claudication: Secondary | ICD-10-CM

## 2020-10-03 DIAGNOSIS — N179 Acute kidney failure, unspecified: Secondary | ICD-10-CM | POA: Diagnosis not present

## 2020-10-03 LAB — GLUCOSE, CAPILLARY
Glucose-Capillary: 47 mg/dL — ABNORMAL LOW (ref 70–99)
Glucose-Capillary: 65 mg/dL — ABNORMAL LOW (ref 70–99)
Glucose-Capillary: 75 mg/dL (ref 70–99)
Glucose-Capillary: 90 mg/dL (ref 70–99)
Glucose-Capillary: 110 mg/dL — ABNORMAL HIGH (ref 70–99)
Glucose-Capillary: 143 mg/dL — ABNORMAL HIGH (ref 70–99)
Glucose-Capillary: 98 mg/dL (ref 70–99)
Glucose-Capillary: 116 mg/dL — ABNORMAL HIGH (ref 70–99)
Glucose-Capillary: 90 mg/dL (ref 70–99)

## 2020-10-03 LAB — CBC
HCT: 22.1 % — ABNORMAL LOW (ref 39.0–52.0)
Hemoglobin: 7.2 g/dL — ABNORMAL LOW (ref 13.0–17.0)
MCH: 31 pg (ref 26.0–34.0)
MCHC: 32.6 g/dL (ref 30.0–36.0)
MCV: 95.3 fL (ref 80.0–100.0)
Platelets: 170 10*3/uL (ref 150–400)
RBC: 2.32 MIL/uL — ABNORMAL LOW (ref 4.22–5.81)
RDW: 15 % (ref 11.5–15.5)
WBC: 9.4 10*3/uL (ref 4.0–10.5)
nRBC: 0.2 % (ref 0.0–0.2)

## 2020-10-03 LAB — RENAL FUNCTION PANEL
Albumin: 2.8 g/dL — ABNORMAL LOW (ref 3.5–5.0)
Anion gap: 16 — ABNORMAL HIGH (ref 5–15)
BUN: 97 mg/dL — ABNORMAL HIGH (ref 8–23)
CO2: 18 mmol/L — ABNORMAL LOW (ref 22–32)
Calcium: 6.3 mg/dL — CL (ref 8.9–10.3)
Chloride: 107 mmol/L (ref 98–111)
Creatinine, Ser: 7.2 mg/dL — ABNORMAL HIGH (ref 0.61–1.24)
GFR, Estimated: 7 mL/min — ABNORMAL LOW (ref >60)
Glucose, Bld: 103 mg/dL — ABNORMAL HIGH (ref 70–99)
Phosphorus: 7.9 mg/dL — ABNORMAL HIGH (ref 2.5–4.6)
Potassium: 3.3 mmol/L — ABNORMAL LOW (ref 3.5–5.1)
Sodium: 141 mmol/L (ref 135–145)

## 2020-10-03 LAB — HEPATITIS PANEL, ACUTE
HCV Ab: NONREACTIVE
Hep A IgM: NONREACTIVE
Hep B C IgM: NONREACTIVE
Hepatitis B Surface Ag: NONREACTIVE

## 2020-10-03 LAB — VITAMIN B12: Vitamin B-12: 725 pg/mL (ref 180–914)

## 2020-10-03 LAB — MAGNESIUM: Magnesium: 1.5 mg/dL — ABNORMAL LOW (ref 1.7–2.4)

## 2020-10-03 LAB — PROCALCITONIN: Procalcitonin: 4.13 ng/mL

## 2020-10-03 MED ORDER — GUAIFENESIN 100 MG/5ML PO SOLN
5.0000 mL | Freq: Three times a day (TID) | ORAL | Status: DC
  Administered 2020-10-03 – 2020-10-12 (×24): 100 mg via ORAL
  Filled 2020-10-03 (×30): qty 5

## 2020-10-03 MED ORDER — ALBUMIN HUMAN 25 % IV SOLN
12.5000 g | Freq: Once | INTRAVENOUS | Status: AC
  Administered 2020-10-03: 12.5 g via INTRAVENOUS
  Filled 2020-10-03: qty 50

## 2020-10-03 MED ORDER — THIAMINE HCL 100 MG/ML IJ SOLN
100.0000 mg | Freq: Every day | INTRAMUSCULAR | Status: DC
  Administered 2020-10-03 – 2020-10-06 (×4): 100 mg via INTRAVENOUS
  Filled 2020-10-03 (×3): qty 2

## 2020-10-03 MED ORDER — THIAMINE HCL 100 MG/ML IJ SOLN
100.0000 mg | Freq: Every day | INTRAMUSCULAR | Status: DC
  Filled 2020-10-03: qty 1

## 2020-10-03 MED ORDER — NEPRO/CARBSTEADY PO LIQD
237.0000 mL | Freq: Two times a day (BID) | ORAL | Status: DC
  Administered 2020-10-03 – 2020-10-12 (×8): 237 mL via ORAL

## 2020-10-03 NOTE — Care Management Important Message (Signed)
Important Message  Patient Details  Name: [name] MRN: [id number] Date of Birth: 08-Feb-1946   Medicare Important Message Given:  Yes     Dannette Barbara 10/03/2020, 11:11 AM

## 2020-10-03 NOTE — Progress Notes (Addendum)
PROGRESS NOTE  [name] IHK:[id number] DOB: Nov 24, 1946 DOA: 09/30/2020 PCP: Derinda Late, MD   LOS: 3 days   Brief narrative: As per HPI,  [name] is a 74 y.o. 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  He was referred to the emergency room by his PCP because of lower extremity weakness and acute kidney injury. He was found to have acute kidney injury complicated by hyperkalemia, metabolic acidosis, uremic encephalopathy, myoclonic jerks, hyperphosphatemia and hypocalcemia.  He was treated with IV sodium bicarbonate infusion.  Nephrologist was consulted to assist with management.  Unfortunately, kidney function has worsened.  There was concern for fluid overload.  He also has acute hypoxemic respiratory failure requiring oxygen via nasal cannula.  He probably has underlying chronic hypoxemic respiratory failure as well.  Vascular surgeon was consulted for temporary dialysis catheter for hemodialysis.   Assessment/Plan:  Principal Problem:   AKI (acute kidney injury) ([place]) Active Problems:   Essential hypertension   Type II diabetes mellitus with renal manifestations (HCC)   Anemia in chronic kidney disease   Spinal stenosis, lumbar region, with neurogenic claudication   Hyperkalemia   COPD (chronic obstructive pulmonary disease) ([place])   CKD stage 3 due to type 2 diabetes mellitus ([place])   History of renal cell carcinoma   History of left nephrectomy 2004   Hypoglycemia   Pancreatic insufficiency   Chronic diarrhea   Metabolic acidosis   AKI on CKD stage IIIb with features of metabolic acidosis, myoclonic jerks, hyperkalemia and uremia.  Status post temporary  hemodialysis catheter placement and hemodialysis.  Nephrology on board.  Hold sedative, hypnotics.  Off Remeron and Requip.  Gabapentin and Cymbalta were discontinued on 10/01/2020. Plan for HD today.   Hyperphosphatemia and hypocalcemia, mild hypomagnesemia: Patient was given IV calcium gluconate yesterday.   Calcium borderline low at this time for albumin correction.  Could be corrected with hemodialysis. Plan for HD today. Seen by nephrology today.   Acute metabolic encephalopathy/uremic encephalopathy: Dialysis as per nephrology. Very encephalopathic at this time.   Fever:  On IV Rocephin empirically.  UA however was negative.  No obvious source of infection fever.  Follow blood cultures.  Respiratory panel was negative for Covid and flu.   Temperature max of 100.63 Fahrenheit.  Acute hypoxemic respiratory failure, probably underlying chronic hypoxia: Was initially on 4 L of nasal canula,  currently on room air.  Essential hypertension: Continue carvedilol.  Blood pressure seems to be stable.  History of DVT and PE: Continue Eliquis renal dosing.  Type II DM with recurrent hypoglycemia:  Off glipizide.  Will hold off with sliding scale insulin completely and monitor closely.  Restart when stable..  Anemia of chronic kidney disease: H&H is stable.  Continue to monitor.  Latest hemoglobin of 7.2.  Chronic pancreatitis/chronic diarrhea: Continue Creon if po able  COPD: As needed bronchodilators.  Chronic back pain: Analgesics as needed for pain.  Depression: Remeron and Cymbalta has been discontinued because of AKI  Ethics. Patient appears to encephalopathic and very sick at this time. Explained this to the wife at bedside.   DVT prophylaxis: apixaban (ELIQUIS) tablet 2.5 mg Start: 10/02/20 1000 apixaban (ELIQUIS) tablet 2.5 mg    Code Status: Full code  Family Communication: spoke with the  patient's wife at bedside.   Status is: Inpatient  Remains inpatient  appropriate because:Persistent severe electrolyte disturbances, Unsafe d/c plan, IV treatments appropriate due to intensity of illness or inability to take PO, Inpatient level of care appropriate due to severity of illness and Need for hemodialysis   Dispo: The patient is from: Home              Anticipated d/c is to: Home              Anticipated d/c date is: 3 days              Patient currently is not medically stable to d/c.   Consultants:  , Vascular surgery  Nephrology  Procedures:  Hemodialysis  Antibiotics:  . Rocephin IV  Anti-infectives (From admission, onward)   Start     Dose/Rate Route Frequency Ordered Stop   10/02/20 1300  cefTRIAXone (ROCEPHIN) 1 g in sodium chloride 0.9 % 100 mL IVPB        1 g 200 mL/hr over 30 Minutes Intravenous Every 24 hours 10/02/20 1204       Subjective: Today, patient was seen and examined at bedside. Unable to verbalize, involuntary twitching and jerks.   Objective: Vitals:   10/03/20 0311 10/03/20 0755  BP: 124/69 116/66  Pulse: 95 88  Resp: 19   Temp: 97.6 F (36.4 C) 98.3 F (36.8 C)  SpO2: 96% 99%    Intake/Output Summary (Last 24 hours) at 10/03/2020 0827 Last data filed at 10/02/2020 2023 Gross per 24 hour  Intake 476.91 ml  Output 1100 ml  Net -623.09 ml   Filed Weights   10/02/20 0400 10/02/20 2143 10/03/20 0312  Weight: 117.5 kg 116 kg 118 kg   Body mass index is 35.29 kg/m.   Physical Exam:  GENERAL: Patient is alert awake but unable to verbalize. Not in obvious distress.  Obese HENT: No scleral pallor or icterus. Pupils equally reactive to light. Oral mucosa is dry NECK: is supple, no gross swelling noted. CHEST: Clear to auscultation. No crackles or wheezes.  Diminished breath sounds bilaterally. CVS: S1 and S2 heard, no murmur. Regular rate and rhythm.  ABDOMEN: Soft, non-tender, bowel sounds are present. EXTREMITIES: Right femoral HD catheter. CNS: twitching movements of body, myoclonic jerks,  unable to verbalize or follow commands.  SKIN: warm and dry without rashes.  Data Review: I have personally reviewed the following laboratory data and studies,  CBC: Recent Labs  Lab 09/30/20 1953 10/01/20 0321 10/02/20 0528 10/03/20 0547  WBC 12.0* 13.6* 9.2 9.4  HGB 9.7* 8.4* 7.6* 7.2*  HCT 30.0* 26.1* 23.9* 22.1*  MCV 97.1 97.4 96.8 95.3  PLT 198 220 188 536   Basic Metabolic Panel: Recent Labs  Lab 09/30/20 1953 10/01/20 0316 10/01/20 0321 10/02/20 0528 10/02/20 1315 10/02/20 1746 10/03/20 0547  NA 141  --  140 142 140  --  141  K 5.8*  --  5.2* 4.3 4.0  --  3.3*  CL 116*  --  114* 112* 111  --  107  CO2 12*  --  16* 11* 12*  --  18*  GLUCOSE 66*  --  198* 61* 62*  --  103*  BUN 112*  --  108* 126* 130*  --  97*  CREATININE 6.55*  --  6.69* 8.12* 8.82*  --  7.20*  CALCIUM 6.8*  --  6.5* 5.7* 5.8*  --  6.3*  MG  --  1.6*  --   --   --   --  1.5*  PHOS  --  10.4*  --  10.7*  --  11.5* 7.9*   Liver Function Tests: Recent Labs  Lab 10/02/20 0528 10/03/20 0547  AST 14*  --   ALT 15  --   ALKPHOS 107  --   BILITOT 0.7  --   PROT 5.9*  --   ALBUMIN 3.0* 2.8*   No results for input(s): LIPASE, AMYLASE in the last 168 hours. No results for input(s): AMMONIA in the last 168 hours. Cardiac Enzymes: No results for input(s): CKTOTAL, CKMB, CKMBINDEX, TROPONINI in the last 168 hours. BNP (last 3 results) Recent Labs    03/25/20 0557  BNP 31.0    ProBNP (last 3 results) No results for input(s): PROBNP in the last 8760 hours.  CBG: Recent Labs  Lab 10/02/20 1741 10/02/20 2122 10/02/20 2249 10/02/20 2342 10/03/20 0320  GLUCAP 82 71 72 137* 90   Recent Results (from the past 240 hour(s))  Respiratory Panel by RT PCR (Flu A&B, Covid) - Nasopharyngeal Swab     Status: None   Collection Time: 09/30/20  9:07 PM   Specimen: Nasopharyngeal Swab  Result Value Ref Range Status   SARS Coronavirus 2 by RT PCR NEGATIVE NEGATIVE Final    Comment:  (NOTE) SARS-CoV-2 target nucleic acids are NOT DETECTED.  The SARS-CoV-2 RNA is generally detectable in upper respiratoy specimens during the acute phase of infection. The lowest concentration of SARS-CoV-2 viral copies this assay can detect is 131 copies/mL. A negative result does not preclude SARS-Cov-2 infection and should not be used as the sole basis for treatment or other patient management decisions. A negative result may occur with  improper specimen collection/handling, submission of specimen other than nasopharyngeal swab, presence of viral mutation(s) within the areas targeted by this assay, and inadequate number of viral copies (<131 copies/mL). A negative result must be combined with clinical observations, patient history, and epidemiological information. The expected result is Negative.  Fact Sheet for Patients:  PinkCheek.be  Fact Sheet for Healthcare Providers:  GravelBags.it  This test is no t yet approved or cleared by the [place] FDA and  has been authorized for detection and/or diagnosis of SARS-CoV-2 by FDA under an Emergency Use Authorization (EUA). This EUA will remain  in effect (meaning this test can be used) for the duration of the COVID-19 declaration under Section 564(b)(1) of the Act, 21 U.S.C. section 360bbb-3(b)(1), unless the authorization is terminated or revoked sooner.     Influenza A by PCR NEGATIVE NEGATIVE Final   Influenza B by PCR NEGATIVE NEGATIVE Final    Comment: (NOTE) The Xpert Xpress SARS-CoV-2/FLU/RSV assay is intended as an aid in  the diagnosis of influenza from Nasopharyngeal swab specimens and  should not be used as a sole basis for treatment. Nasal washings and  aspirates are unacceptable for Xpert Xpress SARS-CoV-2/FLU/RSV  testing.  Fact Sheet for Patients: PinkCheek.be  Fact Sheet for Healthcare  Providers: GravelBags.it  This test is not yet approved or cleared by the [place] FDA and  has been authorized for detection and/or diagnosis of SARS-CoV-2 by  FDA under an Emergency Use Authorization (EUA). This EUA will remain  in effect (meaning this test can be used) for the duration of the  Covid-19 declaration under Section 564(b)(1) of the Act, 21  U.S.C. section 360bbb-3(b)(1), unless the authorization is  terminated or revoked. Performed at [facility], [place], [address], [place], [place] [address]   CULTURE, BLOOD (ROUTINE X 2) w  Reflex to ID Panel     Status: None (Preliminary result)   Collection Time: 10/02/20  1:16 PM   Specimen: BLOOD  Result Value Ref Range Status   Specimen Description BLOOD LEFT ANTECUBITAL  Final   Special Requests   Final    BOTTLES DRAWN AEROBIC AND ANAEROBIC Blood Culture adequate volume   Culture   Final    NO GROWTH < 24 HOURS Performed at [facility], [address], [place], [place] [address]    Report Status PENDING  Incomplete  CULTURE, BLOOD (ROUTINE X 2) w Reflex to ID Panel     Status: None (Preliminary result)   Collection Time: 10/02/20  1:33 PM   Specimen: BLOOD  Result Value Ref Range Status   Specimen Description BLOOD BLOOD LEFT HAND  Final   Special Requests   Final    BOTTLES DRAWN AEROBIC AND ANAEROBIC Blood Culture adequate volume   Culture   Final    NO GROWTH < 24 HOURS Performed at [facility], [address] [place], [place] [address]    Report Status PENDING  Incomplete     Studies: CT HEAD WO CONTRAST  Result Date: 10/03/2020 CLINICAL DATA:  Altered mental status. EXAM: CT HEAD WITHOUT CONTRAST TECHNIQUE: Contiguous axial images were obtained from the base of the skull through the vertex without intravenous contrast. COMPARISON:  01/24/2019 FINDINGS: Brain: Motion and streak artifact limit examination. Ventricles and sulci are symmetrical. No mass  effect or midline shift. No abnormal extra-axial fluid collections. Gray-white matter junctions are distinct. Basal cisterns are not effaced. No acute intracranial hemorrhage. Vascular: Mild intracranial arterial vascular calcifications. Skull: Calvarium appears intact. Sinuses/Orbits: Mucosal thickening in the paranasal sinuses. No acute air-fluid levels. Mastoid air cells are clear. Other: None. IMPRESSION: No acute intracranial abnormalities. Electronically Signed   By: Lucienne Capers M.D.   On: 10/03/2020 01:05   PERIPHERAL VASCULAR CATHETERIZATION  Result Date: 10/02/2020 See Op Note  DG Chest Port 1 View  Result Date: 10/02/2020 CLINICAL DATA:  Fever. EXAM: PORTABLE CHEST 1 VIEW COMPARISON:  09/30/2020 chest radiograph and prior. FINDINGS: Left predominant bibasilar opacities. No pneumothorax. Trace left pleural effusion. Cardiomediastinal silhouette is unchanged. No acute osseous abnormality. IMPRESSION: Left predominant bibasilar opacities, atelectasis versus infection. Trace left pleural effusion. Electronically Signed   By: Primitivo Gauze M.D.   On: 10/02/2020 13:03      Flora Lipps, MD  [facility] 10/03/2020

## 2020-10-03 NOTE — Progress Notes (Addendum)
[facility]  ROUNDING NOTE   Subjective:   [name] is 74 years old male with past medical history of diabetes, hypertension, GERD, renal cell carcinoma post left nephrectomy in 2004, COPD, pancreatic insufficiency, CKD stage III , anemia of CKD sent in to the ED by his PCP for abnormal renal function test and progressively worsening bilateral lower extremity weakness. Patient's baseline creatinine is 2.2/GFR 30 from 06/26/2020  Patient received his first dialysis treatment yesterday. He  appears better today, but still in moderate distress, reports chest congestion,anorexia and poor oral intake.He is on 4L supplemental O2 via [place], respirations symmetrical, slightly labored.  Urine output for the preceding 24 hours is 600 ml  Objective:  Vital signs in last 24 hours:  Temp:  [97.6 F (36.4 C)-100.2 F (37.9 C)] 98.6 F (37 C) (10/20 1522) Pulse Rate:  [79-102] 82 (10/20 1522) Resp:  [14-20] 20 (10/20 1522) BP: (102-129)/(54-73) 114/73 (10/20 1522) SpO2:  [93 %-100 %] 98 % (10/20 1522) Weight:  [782 kg-118 kg] 118 kg (10/20 0312)  Weight change: -0.59 kg Filed Weights   10/02/20 0400 10/02/20 2143 10/03/20 0312  Weight: 117.5 kg 116 kg 118 kg    Intake/Output: I/O last 3 completed shifts: In: 1338.6 [I.V.:1196.4; IV Piggyback:142.3] Out: 1100 [Urine:600; Other:500]   Intake/Output this shift:  Total I/O In: -  Out: 800 [Urine:800]  Physical Exam: General:  Awake, alert, in moderate distress  Head: Normocephalic, atraumatic. Moist oral mucosal membranes  Eyes: Anicteric  Neck: Supple  Lungs:   Crackles +bilaterally, Respirations labored,accessory muscle use +  Heart: Regular rate and rhythm  Abdomen:  Soft, nontender, distended  Extremities:  Trace peripheral edema.  Neurologic:  Sleeping,drowsy,arousable to call  Skin: No acute lesions or rashes    Basic Metabolic Panel: Recent Labs  Lab 09/30/20 1953 09/30/20 1953 10/01/20 0316 10/01/20 0321  10/01/20 0321 10/02/20 0528 10/02/20 1315 10/02/20 1746 10/03/20 0547  NA 141  --   --  140  --  142 140  --  141  K 5.8*  --   --  5.2*  --  4.3 4.0  --  3.3*  CL 116*  --   --  114*  --  112* 111  --  107  CO2 12*  --   --  16*  --  11* 12*  --  18*  GLUCOSE 66*  --   --  198*  --  61* 62*  --  103*  BUN 112*  --   --  108*  --  126* 130*  --  97*  CREATININE 6.55*  --   --  6.69*  --  8.12* 8.82*  --  7.20*  CALCIUM 6.8*   < >  --  6.5*   < > 5.7* 5.8*  --  6.3*  MG  --   --  1.6*  --   --   --   --   --  1.5*  PHOS  --   --  10.4*  --   --  10.7*  --  11.5* 7.9*   < > = values in this interval not displayed.    Liver Function Tests: Recent Labs  Lab 10/02/20 0528 10/03/20 0547  AST 14*  --   ALT 15  --   ALKPHOS 107  --   BILITOT 0.7  --   PROT 5.9*  --   ALBUMIN 3.0* 2.8*   No results for input(s): LIPASE, AMYLASE in the last  168 hours. No results for input(s): AMMONIA in the last 168 hours.  CBC: Recent Labs  Lab 09/30/20 1953 10/01/20 0321 10/02/20 0528 10/03/20 0547  WBC 12.0* 13.6* 9.2 9.4  HGB 9.7* 8.4* 7.6* 7.2*  HCT 30.0* 26.1* 23.9* 22.1*  MCV 97.1 97.4 96.8 95.3  PLT 198 220 188 170    Cardiac Enzymes: No results for input(s): CKTOTAL, CKMB, CKMBINDEX, TROPONINI in the last 168 hours.  BNP: Invalid input(s): POCBNP  CBG: Recent Labs  Lab 10/02/20 2249 10/02/20 2342 10/03/20 0320 10/03/20 0854 10/03/20 1223  GLUCAP 72 137* 90 110* 143*    Microbiology: Results for orders placed or performed during the hospital encounter of 09/30/20  Respiratory Panel by RT PCR (Flu A&B, Covid) - Nasopharyngeal Swab     Status: None   Collection Time: 09/30/20  9:07 PM   Specimen: Nasopharyngeal Swab  Result Value Ref Range Status   SARS Coronavirus 2 by RT PCR NEGATIVE NEGATIVE Final    Comment: (NOTE) SARS-CoV-2 target nucleic acids are NOT DETECTED.  The SARS-CoV-2 RNA is generally detectable in upper respiratoy specimens during the acute  phase of infection. The lowest concentration of SARS-CoV-2 viral copies this assay can detect is 131 copies/mL. A negative result does not preclude SARS-Cov-2 infection and should not be used as the sole basis for treatment or other patient management decisions. A negative result may occur with  improper specimen collection/handling, submission of specimen other than nasopharyngeal swab, presence of viral mutation(s) within the areas targeted by this assay, and inadequate number of viral copies (<131 copies/mL). A negative result must be combined with clinical observations, patient history, and epidemiological information. The expected result is Negative.  Fact Sheet for Patients:  PinkCheek.be  Fact Sheet for Healthcare Providers:  GravelBags.it  This test is no t yet approved or cleared by the [place] FDA and  has been authorized for detection and/or diagnosis of SARS-CoV-2 by FDA under an Emergency Use Authorization (EUA). This EUA will remain  in effect (meaning this test can be used) for the duration of the COVID-19 declaration under Section 564(b)(1) of the Act, 21 U.S.C. section 360bbb-3(b)(1), unless the authorization is terminated or revoked sooner.     Influenza A by PCR NEGATIVE NEGATIVE Final   Influenza B by PCR NEGATIVE NEGATIVE Final    Comment: (NOTE) The Xpert Xpress SARS-CoV-2/FLU/RSV assay is intended as an aid in  the diagnosis of influenza from Nasopharyngeal swab specimens and  should not be used as a sole basis for treatment. Nasal washings and  aspirates are unacceptable for Xpert Xpress SARS-CoV-2/FLU/RSV  testing.  Fact Sheet for Patients: PinkCheek.be  Fact Sheet for Healthcare Providers: GravelBags.it  This test is not yet approved or cleared by the [place] FDA and  has been authorized for detection and/or diagnosis of  SARS-CoV-2 by  FDA under an Emergency Use Authorization (EUA). This EUA will remain  in effect (meaning this test can be used) for the duration of the  Covid-19 declaration under Section 564(b)(1) of the Act, 21  U.S.C. section 360bbb-3(b)(1), unless the authorization is  terminated or revoked. Performed at [facility], [place] [place],  [address]   CULTURE, BLOOD (ROUTINE X 2) w Reflex to ID Panel     Status: None (Preliminary result)   Collection Time: 10/02/20  1:16 PM   Specimen: BLOOD  Result Value Ref Range Status   Specimen Description BLOOD LEFT ANTECUBITAL  Final   Special Requests   Final  BOTTLES DRAWN AEROBIC AND ANAEROBIC Blood Culture adequate volume   Culture   Final    NO GROWTH < 24 HOURS Performed at [facility], [address] [place], [place] [address]    Report Status PENDING  Incomplete  CULTURE, BLOOD (ROUTINE X 2) w Reflex to ID Panel     Status: None (Preliminary result)   Collection Time: 10/02/20  1:33 PM   Specimen: BLOOD  Result Value Ref Range Status   Specimen Description BLOOD BLOOD LEFT HAND  Final   Special Requests   Final    BOTTLES DRAWN AEROBIC AND ANAEROBIC Blood Culture adequate volume   Culture   Final    NO GROWTH < 24 HOURS Performed at [facility], [address] [place],  [address]    Report Status PENDING  Incomplete    Coagulation Studies: No results for input(s): LABPROT, INR in the last 72 hours.  Urinalysis: Recent Labs    10/01/20 0316  COLORURINE YELLOW*  LABSPEC 1.008  PHURINE 5.0  GLUCOSEU NEGATIVE  HGBUR MODERATE*  BILIRUBINUR NEGATIVE  KETONESUR NEGATIVE  PROTEINUR 30*  NITRITE NEGATIVE  LEUKOCYTESUR NEGATIVE      Imaging: CT HEAD WO CONTRAST  Result Date: 10/03/2020 CLINICAL DATA:  Altered mental status. EXAM: CT HEAD WITHOUT CONTRAST TECHNIQUE: Contiguous axial images were obtained from the base of the skull through the vertex without  intravenous contrast. COMPARISON:  01/24/2019 FINDINGS: Brain: Motion and streak artifact limit examination. Ventricles and sulci are symmetrical. No mass effect or midline shift. No abnormal extra-axial fluid collections. Gray-white matter junctions are distinct. Basal cisterns are not effaced. No acute intracranial hemorrhage. Vascular: Mild intracranial arterial vascular calcifications. Skull: Calvarium appears intact. Sinuses/Orbits: Mucosal thickening in the paranasal sinuses. No acute air-fluid levels. Mastoid air cells are clear. Other: None. IMPRESSION: No acute intracranial abnormalities. Electronically Signed   By: Lucienne Capers M.D.   On: 10/03/2020 01:05   PERIPHERAL VASCULAR CATHETERIZATION  Result Date: 10/02/2020 See Op Note  DG Chest Port 1 View  Result Date: 10/02/2020 CLINICAL DATA:  Fever. EXAM: PORTABLE CHEST 1 VIEW COMPARISON:  09/30/2020 chest radiograph and prior. FINDINGS: Left predominant bibasilar opacities. No pneumothorax. Trace left pleural effusion. Cardiomediastinal silhouette is unchanged. No acute osseous abnormality. IMPRESSION: Left predominant bibasilar opacities, atelectasis versus infection. Trace left pleural effusion. Electronically Signed   By: Primitivo Gauze M.D.   On: 10/02/2020 13:03     Medications:   . albumin human    . cefTRIAXone (ROCEPHIN)  IV 1 g (10/03/20 1300)  . dextrose 30 mL/hr at 10/02/20 1334   . apixaban  2.5 mg Oral BID  . carvedilol  6.25 mg Oral BID WC  . Chlorhexidine Gluconate Cloth  6 each Topical Q0600  . feeding supplement (NEPRO CARB STEADY)  237 mL Oral BID BM  . guaiFENesin  5 mL Oral TID  . pantoprazole  40 mg Oral BID  . sodium chloride flush  3 mL Intravenous Q12H  . sucralfate  1 g Oral TID   acetaminophen **OR** acetaminophen, albuterol, albuterol, clonazePAM, ondansetron **OR** ondansetron (ZOFRAN) IV, oxyCODONE-acetaminophen **AND** oxyCODONE, sodium chloride flush  Assessment/ Plan:  [name] is a 74 y.o.  male [name] is 74 years old male with past medical history of diabetes, hypertension, GERD, renal cell carcinoma post left nephrectomy in 2004, COPD, pancreatic insufficiency, CKD stage III , anemia of CKD sent in to the ED by his PCP for abnormal renal function test and progressively  worsening bilateral lower extremity weakness.  # Acute kidney injury on CKD stage IIIa #Metabolic acidosis Patient's baseline creatinine is 2.2/GFR 30 from 06/26/2020  Creatinine 7.20  BUN 97 CO2 18  -Patient  has history of renal cell carcinoma with left nephrectomy in 2004 -Declining renal function, respiratory status, and fluid overload noted on 10/02/20, placed Rt.Femoral catheter by vascular,received urgent dialysis yesterday - Urine output improving, 600 ml for the preceding 24 hours - Planning for dialysis again today - Dialysis orders placed    #Hypokalemia Potassium 3.3 today  Will order 4 K+  bath  for dilaysis   #Anemia of CKD Lab Results  Component Value Date   HGB 7.2 (L) 10/03/2020    We will continue monitoring closely  # Hypertension Blood pressure readings at low normal range Carvedilol dose reduced to 6.25  yesterday   #Hypoglycemia HbA1C on 09/30/20 4.9 Multiple Hypoglycemic episodes during this admission Patient is on Dextrose 10 % 30 ml/hr Encourage oral intake  N[place]ro added for nutritional supplementation  # Fever -Temperature readings normal today - Blood cultures in process -On IV Rocephin empirically -Guaifenesin changed to scheduled dose for cough/congestion   Chest Xray 10/01/20 IMPRESSION: No acute cardiopulmonary abnormality.   LOS: 3 Princy Raju 10/20/20213:30 PM   Patient was seen and evaluated with Crosby Oyster, NP. She assisted with the transcription of this note.  Murlean Iba, MD   10/03/2020

## 2020-10-03 NOTE — Progress Notes (Signed)
Patient BS - 73, patient having tremors and facial twitches, not following commands, no verbal responses, chest feels congested. Paged the provider who requested for a rapid response and came up to assess the patient. A CT-Scan ordered, and a Dose of was D50 administered (see MAR)

## 2020-10-03 NOTE — Plan of Care (Signed)
A/o to person only. Remains on 4L O2. HD attempted, patient rescheduled for tomorrow; pt pulling at lines per HD nurse. Place mittens on patient to prevent pulling at lines per Dr. Louanne Belton.  Problem: Education: Goal: Knowledge of General Education information will improve Description: Including pain rating scale, medication(s)/side effects and non-pharmacologic comfort measures Outcome: Progressing   Problem: Health Behavior/Discharge Planning: Goal: Ability to manage health-related needs will improve Outcome: Progressing   Problem: Clinical Measurements: Goal: Ability to maintain clinical measurements within normal limits will improve Outcome: Progressing Goal: Will remain free from infection Outcome: Progressing Goal: Diagnostic test results will improve Outcome: Progressing Goal: Respiratory complications will improve Outcome: Progressing Goal: Cardiovascular complication will be avoided Outcome: Progressing   Problem: Activity: Goal: Risk for activity intolerance will decrease Outcome: Progressing   Problem: Nutrition: Goal: Adequate nutrition will be maintained Outcome: Progressing  Problem: Coping: Goal: Level of anxiety will decrease Outcome: Progressing   Problem: Elimination: Goal: Will not experience complications related to bowel motility Outcome: Progressing Goal: Will not experience complications related to urinary retention Outcome: Progressing   Problem: Pain Managment: Goal: General experience of comfort will improve Outcome: Progressing   Problem: Safety: Goal: Ability to remain free from injury will improve Outcome: Progressing   Problem: Skin Integrity: Goal: Risk for impaired skin integrity will decrease Outcome: Progressing

## 2020-10-03 NOTE — Progress Notes (Signed)
Patient becoming agitated, yelling and wailing out during treatment.  The patient attempted to pull out his femoral catheter several times during treatment. Patient also trying to get out of bed. RN and PCT attempted to calm patient and reiterated the importance of minimal movement during treatment while lying in the bed. Princy,NP made aware of patients behavior during rounding, requesting anxiety medication to be administered. Primary RN K. Oval Linsey made aware of request for anxiety medication at this time.Primary RN at the bedside administering medications shortly afterwards with no results in behavior. Dialysis RN called Dr.Singh to inform of continued behavior.Treatment d/ced 1hr and 19 minutes early with Primary RN made aware patient to be returning back to room. PCT made aware to the RN that patient was diaphoretic just before transport. Patient provided peanut butter crackers before leaving the unit and had started eating them prior to leaving, Primary RN made aware of this and states she will check his blood sugar when he returns to room. Patient baseline mentality during initial assessment is evident of patient alert and oriented to self/DOB. RN educated the patient on eating food prior to dialysis to maintain blood sugar. Primary RN made aware of this as well and to administer something for anxiety prior to treatment tomorrow with stating she will pass this on during report.

## 2020-10-03 NOTE — Progress Notes (Signed)
Mobility Specialist - Progress Note   10/03/20 1334  Mobility  Activity  (bed exercises)  Level of Assistance Minimal assist, patient does 75% or more  Mobility Response Tolerated well  Mobility performed by Mobility specialist  $Mobility charge 1 Mobility    Pt laying in bed w/ wife and nurse present in room upon arrival. Pt agreed to session via head nod. Pt currently having tremors and facial twitches. Pt able to give simple verbal responses and follow commands. Pt performed LE bed exercises: slr x 5, heel slides x 5, hip ab/ad x5. Pt needed min. Assist d/t leg weakness. Overall, pt tolerated session well. Pt remained in bed w/ wife present in room. All needs placed in reach.     Amiel Mccaffrey Mobility Specialist  10/03/20, 1:37 PM

## 2020-10-03 NOTE — Evaluation (Signed)
Clinical/Bedside Swallow Evaluation Patient Details  Name: [name] MRN: [id number] Date of Birth: 1946-05-26  Today's Date: 10/03/2020 Time: SLP Start Time (ACUTE ONLY): 69 SLP Stop Time (ACUTE ONLY): 1311 SLP Time Calculation (min) (ACUTE ONLY): 31 min  Past Medical History:  Past Medical History:  Diagnosis Date  . Chronic back pain   . COPD (chronic obstructive pulmonary disease) ([place])   . Depression   . Diabetes mellitus without complication ([place])   . Hyperlipidemia   . Hypertension   . Renal cell carcinoma ([place])   . Sleep apnea    Past Surgical History:  Past Surgical History:  Procedure Laterality Date  . CARPAL TUNNEL RELEASE    . CHOLECYSTECTOMY    . COLONOSCOPY WITH PROPOFOL N/A 07/29/2017   Procedure: COLONOSCOPY WITH PROPOFOL;  Surgeon: Manya Silvas, MD;  Location: [facility] ENDOSCOPY;  Service: Endoscopy;  Laterality: N/A;  . colonoscopys    . ESOPHAGOGASTRODUODENOSCOPY (EGD) WITH PROPOFOL N/A 07/29/2017   Procedure: ESOPHAGOGASTRODUODENOSCOPY (EGD) WITH PROPOFOL;  Surgeon: Manya Silvas, MD;  Location: [facility] ENDOSCOPY;  Service: Endoscopy;  Laterality: N/A;  . FLEXIBLE SIGMOIDOSCOPY    . GASTRIC BYPASS    . NEPHRECTOMY    . open reduction and internal fixation, right distal radius    . right knee arthroscopy    . TEMPORARY DIALYSIS CATHETER N/A 10/02/2020   Procedure: TEMPORARY DIALYSIS CATHETER;  Surgeon: Katha Cabal, MD;  Location: [facility] CV LAB;  Service: Cardiovascular;  Laterality: N/A;  . UPPER GI ENDOSCOPY     HPI:      Assessment / Plan / Recommendation Clinical Impression  Pt is at a very high risk of aspiration. Pt presents with severe dyskinesias that impact the coordination of his oropharyngeal abilities. Trials of PO intake were minimal as pt reports sensation of choking when swallowing (even when swallowing his own salvia). Trial of thin liquids were spewed from his mouth and audible wheeze was present after  swallowing puree. At this time, recommend NPO with ice chips after oral care and can attempt medicine crushed in puree. Good oral care is required to reduce the bacterial load of pt's own salvia. ST to follow for possible diet advancement.  SLP Visit Diagnosis: Dysphagia, oropharyngeal phase (R13.12)    Aspiration Risk  Severe aspiration risk;Risk for inadequate nutrition/hydration    Diet Recommendation NPO   Medication Administration: Crushed with puree Postural Changes: Seated upright at 90 degrees    Other  Recommendations Oral Care Recommendations: Oral care QID;Oral care prior to ice chip/H20   Follow up Recommendations  (TBD)      Frequency and Duration min 2x/week  2 weeks       Prognosis Prognosis for Safe Diet Advancement: Fair Barriers to Reach Goals: Severity of deficits (severity of medical issues)      Swallow Study   General Date of Onset: 09/30/20 Type of Study: Bedside Swallow Evaluation Previous Swallow Assessment: none in chart Diet Prior to this Study: Regular;Thin liquids Temperature Spikes Noted: Yes Respiratory Status: Nasal cannula History of Recent Intubation: No Behavior/Cognition: Alert;Pleasant mood;Confused;Distractible;Requires cueing Oral Cavity Assessment: Within Functional Limits Oral Care Completed by SLP: Yes Oral Cavity - Dentition: Adequate natural dentition Self-Feeding Abilities: Total assist (d/t dyskinesias) Patient Positioning: Upright in bed Baseline Vocal Quality: Normal Volitional Cough: Strong Volitional Swallow: Able to elicit    Oral/Motor/Sensory Function Overall Oral Motor/Sensory Function: Within functional limits   Ice Chips Ice chips: Not tested   Thin Liquid Thin Liquid: Impaired Presentation: Straw;Spoon  Oral Phase Impairments: Reduced lingual movement/coordination;Impaired mastication Oral Phase Functional Implications: Prolonged oral transit Pharyngeal  Phase Impairments: Cough - Immediate;Multiple  swallows;Throat Clearing - Immediate    Nectar Thick Nectar Thick Liquid: Not tested   Honey Thick Honey Thick Liquid: Not tested   Puree Puree: Within functional limits Presentation: Spoon   Solid     Solid: Not tested     [name] B. Rutherford Nail M.S., CCC-SLP, [place] [place] 10/03/2020,3:08 PM

## 2020-10-04 DIAGNOSIS — E1122 Type 2 diabetes mellitus with diabetic chronic kidney disease: Secondary | ICD-10-CM | POA: Diagnosis not present

## 2020-10-04 DIAGNOSIS — N1832 Chronic kidney disease, stage 3b: Secondary | ICD-10-CM | POA: Diagnosis not present

## 2020-10-04 DIAGNOSIS — K529 Noninfective gastroenteritis and colitis, unspecified: Secondary | ICD-10-CM | POA: Diagnosis not present

## 2020-10-04 DIAGNOSIS — N179 Acute kidney failure, unspecified: Secondary | ICD-10-CM | POA: Diagnosis not present

## 2020-10-04 LAB — GLUCOSE, CAPILLARY
Glucose-Capillary: 94 mg/dL (ref 70–99)
Glucose-Capillary: 118 mg/dL — ABNORMAL HIGH (ref 70–99)
Glucose-Capillary: 90 mg/dL (ref 70–99)
Glucose-Capillary: 92 mg/dL (ref 70–99)
Glucose-Capillary: 145 mg/dL — ABNORMAL HIGH (ref 70–99)
Glucose-Capillary: 132 mg/dL — ABNORMAL HIGH (ref 70–99)

## 2020-10-04 LAB — BASIC METABOLIC PANEL
Anion gap: 15 (ref 5–15)
BUN: 83 mg/dL — ABNORMAL HIGH (ref 8–23)
CO2: 21 mmol/L — ABNORMAL LOW (ref 22–32)
Calcium: 6.5 mg/dL — ABNORMAL LOW (ref 8.9–10.3)
Chloride: 105 mmol/L (ref 98–111)
Creatinine, Ser: 6.8 mg/dL — ABNORMAL HIGH (ref 0.61–1.24)
GFR, Estimated: 7 mL/min — ABNORMAL LOW (ref >60)
Glucose, Bld: 117 mg/dL — ABNORMAL HIGH (ref 70–99)
Potassium: 3.1 mmol/L — ABNORMAL LOW (ref 3.5–5.1)
Sodium: 141 mmol/L (ref 135–145)

## 2020-10-04 LAB — PHOSPHORUS: Phosphorus: 8.2 mg/dL — ABNORMAL HIGH (ref 2.5–4.6)

## 2020-10-04 LAB — MAGNESIUM: Magnesium: 1.6 mg/dL — ABNORMAL LOW (ref 1.7–2.4)

## 2020-10-04 MED ORDER — MAGNESIUM SULFATE IN D5W 1-5 GM/100ML-% IV SOLN
1.0000 g | Freq: Once | INTRAVENOUS | Status: AC
  Administered 2020-10-04: 1 g via INTRAVENOUS
  Filled 2020-10-04: qty 100

## 2020-10-04 MED ORDER — PANCRELIPASE (LIP-PROT-AMYL) 12000-38000 UNITS PO CPEP
72000.0000 [IU] | ORAL_CAPSULE | Freq: Three times a day (TID) | ORAL | Status: DC
  Administered 2020-10-04 – 2020-10-12 (×19): 72000 [IU] via ORAL
  Filled 2020-10-04 (×26): qty 6

## 2020-10-04 MED ORDER — LORAZEPAM 2 MG/ML IJ SOLN
1.0000 mg | Freq: Once | INTRAMUSCULAR | Status: AC
  Administered 2020-10-04: 1 mg via INTRAVENOUS
  Filled 2020-10-04: qty 1

## 2020-10-04 MED ORDER — POTASSIUM CHLORIDE 10 MEQ/100ML IV SOLN
10.0000 meq | INTRAVENOUS | Status: AC
  Administered 2020-10-04 (×2): 10 meq via INTRAVENOUS
  Filled 2020-10-04 (×2): qty 100

## 2020-10-04 NOTE — Progress Notes (Signed)
  Speech Language Pathology Treatment: Dysphagia  Patient Details Name: [name] MRN: [id number] DOB: Aug 28, 1946 Today's Date: 10/04/2020 Time: 7628-3151 SLP Time Calculation (min) (ACUTE ONLY): 45 min  Assessment / Plan / Recommendation Clinical Impression  Skilled treatment session focused on pt's oropharyngeal abilities to return to PO intake. Pt is much improved over evaluation and although he continues to be confused, he is able to sustain attention to task and follow directions. Skilled observation was provided of pt consuming ice chips, thin liquids via straw, applesauce and graham crackers. Pt had initial cough with ice chips which was likely d/t thinning of mucus. However, his cough ceased and was not present with thin liquids or food items. Out of an abundance of caution, recommend dysphagia 3 diet with thin liquids via straw, medicine crushed in puree. ST to continue following for further diet advancement.    HPI HPI: [name] is a 74 y.o. 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  He was referred to the emergency room by his PCP because of lower extremity weakness and acute kidney injury. He was found to have acute kidney injury complicated by hyperkalemia, metabolic acidosis, uremic encephalopathy, myoclonic jerks, hyperphosphatemia and hypocalcemia. Rapid Response called on 10/02/2020 d/t nonresponsiveness. Head CT on 10/03/2020 didn't reveal any acute intracranial abnormalities. Chest x-ray on 10/02/2020 revealed left predominant bibasilar opacities, atelectasis versus infection and trace left pleural effusion.      SLP Plan  Continue with current plan of care        Recommendations  Diet recommendations: Dysphagia 3 (mechanical soft);Thin liquid Liquids provided via: Straw;Cup Medication Administration: Crushed with puree Supervision: Full supervision/cueing for compensatory strategies;Patient able to self feed Compensations: Minimize environmental distractions;Slow rate;Small sips/bites Postural Changes and/or Swallow Maneuvers: Seated upright 90 degrees;Upright 30-60 min after meal                Oral Care Recommendations: Oral care BID Follow up Recommendations:  (TBD) SLP Visit Diagnosis: Dysphagia, oropharyngeal phase (R13.12) Plan: Continue with current plan of care       GO               [name] B. Rutherford Nail M.S., CCC-SLP, [place] [facility] [phone]  [name] 10/04/2020, 5:33 PM

## 2020-10-04 NOTE — Progress Notes (Signed)
[facility]  ROUNDING NOTE   Subjective:   Mr. Vi is 74 years old male with past medical history of diabetes, hypertension, GERD, renal cell carcinoma post left nephrectomy in 2004, COPD, pancreatic insufficiency, CKD stage III , anemia of CKD sent in to the ED by his PCP for abnormal renal function test and progressively worsening bilateral lower extremity weakness. Patient's baseline creatinine is 2.2/GFR 30 from 06/26/2020  Patient received dialysis treatment yesterday, however was not able to complete the treatment as he got extremely anxious.We are planning for dialysis again today. Patient received a dose of Ativan earlier this morning and appears relaxed.  Urine output for the preceding 24 hours is 800 ml  Objective:  Vital signs in last 24 hours:  Temp:  [97.2 F (36.2 C)-98.9 F (37.2 C)] 97.2 F (36.2 C) (10/21 1000) Pulse Rate:  [66-87] 78 (10/21 1120) Resp:  [14-22] 17 (10/21 1120) BP: (92-134)/(57-73) 113/64 (10/21 1120) SpO2:  [97 %-100 %] 100 % (10/21 1120) Weight:  [116.5 kg] 116.5 kg (10/21 0255)  Weight change: 0.544 kg Filed Weights   10/02/20 2143 10/03/20 0312 10/04/20 0255  Weight: 116 kg 118 kg 116.5 kg    Intake/Output: I/O last 3 completed shifts: In: 562.6 [I.V.:462.6; IV Piggyback:100] Out: 1800 [Urine:800; Other:1000]   Intake/Output this shift:  Total I/O In: 92 [I.V.:92] Out: 1400 [Urine:1400]  Physical Exam: General:  Awake,appears calm and relaxed  Head: Moist oral mucosal membranes  Eyes: Anicteric  Neck: Supple  Lungs:   Lungs diminished at the bases, 4L O2 via Nasal cannula,Respiration even,unlabored  Heart: S1S2 Regular rate and rhythm  Abdomen:  Soft, nontender, distended  Extremities:  No peripheral edema.  Neurologic:  Awake,alert,speech clear and appropriate  Skin: No acute lesions or rashes  RT.Femoral Catheter  Basic Metabolic Panel: Recent Labs  Lab 09/30/20 1953 10/01/20 0316 10/01/20 0321  10/01/20 0321 10/02/20 0528 10/02/20 0528 10/02/20 1315 10/02/20 1746 10/03/20 0547 10/04/20 0530  NA   < >  --  140  --  142  --  140  --  141 141  K   < >  --  5.2*  --  4.3  --  4.0  --  3.3* 3.1*  CL   < >  --  114*  --  112*  --  111  --  107 105  CO2   < >  --  16*  --  11*  --  12*  --  18* 21*  GLUCOSE   < >  --  198*  --  61*  --  62*  --  103* 117*  BUN   < >  --  108*  --  126*  --  130*  --  97* 83*  CREATININE   < >  --  6.69*  --  8.12*  --  8.82*  --  7.20* 6.80*  CALCIUM   < >  --  6.5*   < > 5.7*   < > 5.8*  --  6.3* 6.5*  MG  --  1.6*  --   --   --   --   --   --  1.5* 1.6*  PHOS  --  10.4*  --   --  10.7*  --   --  11.5* 7.9* 8.2*   < > = values in this interval not displayed.    Liver Function Tests: Recent Labs  Lab 10/02/20 0528 10/03/20 0547  AST 14*  --  ALT 15  --   ALKPHOS 107  --   BILITOT 0.7  --   PROT 5.9*  --   ALBUMIN 3.0* 2.8*   No results for input(s): LIPASE, AMYLASE in the last 168 hours. No results for input(s): AMMONIA in the last 168 hours.  CBC: Recent Labs  Lab 09/30/20 1953 10/01/20 0321 10/02/20 0528 10/03/20 0547  WBC 12.0* 13.6* 9.2 9.4  HGB 9.7* 8.4* 7.6* 7.2*  HCT 30.0* 26.1* 23.9* 22.1*  MCV 97.1 97.4 96.8 95.3  PLT 198 220 188 170    Cardiac Enzymes: No results for input(s): CKTOTAL, CKMB, CKMBINDEX, TROPONINI in the last 168 hours.  BNP: Invalid input(s): POCBNP  CBG: Recent Labs  Lab 10/03/20 1806 10/03/20 1958 10/03/20 2329 10/04/20 0315 10/04/20 0751  GLUCAP 98 116* 90 94 118*    Microbiology: Results for orders placed or performed during the hospital encounter of 09/30/20  Respiratory Panel by RT PCR (Flu A&B, Covid) - Nasopharyngeal Swab     Status: None   Collection Time: 09/30/20  9:07 PM   Specimen: Nasopharyngeal Swab  Result Value Ref Range Status   SARS Coronavirus 2 by RT PCR NEGATIVE NEGATIVE Final    Comment: (NOTE) SARS-CoV-2 target nucleic acids are NOT DETECTED.  The  SARS-CoV-2 RNA is generally detectable in upper respiratoy specimens during the acute phase of infection. The lowest concentration of SARS-CoV-2 viral copies this assay can detect is 131 copies/mL. A negative result does not preclude SARS-Cov-2 infection and should not be used as the sole basis for treatment or other patient management decisions. A negative result may occur with  improper specimen collection/handling, submission of specimen other than nasopharyngeal swab, presence of viral mutation(s) within the areas targeted by this assay, and inadequate number of viral copies (<131 copies/mL). A negative result must be combined with clinical observations, patient history, and epidemiological information. The expected result is Negative.  Fact Sheet for Patients:  PinkCheek.be  Fact Sheet for Healthcare Providers:  GravelBags.it  This test is no t yet approved or cleared by the [place] FDA and  has been authorized for detection and/or diagnosis of SARS-CoV-2 by FDA under an Emergency Use Authorization (EUA). This EUA will remain  in effect (meaning this test can be used) for the duration of the COVID-19 declaration under Section 564(b)(1) of the Act, 21 U.S.C. section 360bbb-3(b)(1), unless the authorization is terminated or revoked sooner.     Influenza A by PCR NEGATIVE NEGATIVE Final   Influenza B by PCR NEGATIVE NEGATIVE Final    Comment: (NOTE) The Xpert Xpress SARS-CoV-2/FLU/RSV assay is intended as an aid in  the diagnosis of influenza from Nasopharyngeal swab specimens and  should not be used as a sole basis for treatment. Nasal washings and  aspirates are unacceptable for Xpert Xpress SARS-CoV-2/FLU/RSV  testing.  Fact Sheet for Patients: PinkCheek.be  Fact Sheet for Healthcare Providers: GravelBags.it  This test is not yet approved or cleared  by the [place] FDA and  has been authorized for detection and/or diagnosis of SARS-CoV-2 by  FDA under an Emergency Use Authorization (EUA). This EUA will remain  in effect (meaning this test can be used) for the duration of the  Covid-19 declaration under Section 564(b)(1) of the Act, 21  U.S.C. section 360bbb-3(b)(1), unless the authorization is  terminated or revoked. Performed at [facility], [place], [place] [address]   CULTURE, BLOOD (ROUTINE X 2) w Reflex to ID Panel  Status: None (Preliminary result)   Collection Time: 10/02/20  1:16 PM   Specimen: BLOOD  Result Value Ref Range Status   Specimen Description BLOOD LEFT ANTECUBITAL  Final   Special Requests   Final    BOTTLES DRAWN AEROBIC AND ANAEROBIC Blood Culture adequate volume   Culture   Final    NO GROWTH 2 DAYS Performed at [facility], [address] [place], [place] [address]    Report Status PENDING  Incomplete  CULTURE, BLOOD (ROUTINE X 2) w Reflex to ID Panel     Status: None (Preliminary result)   Collection Time: 10/02/20  1:33 PM   Specimen: BLOOD  Result Value Ref Range Status   Specimen Description BLOOD BLOOD LEFT HAND  Final   Special Requests   Final    BOTTLES DRAWN AEROBIC AND ANAEROBIC Blood Culture adequate volume   Culture   Final    NO GROWTH 2 DAYS Performed at [facility], [address] [place], [place] [address]    Report Status PENDING  Incomplete    Coagulation Studies: No results for input(s): LABPROT, INR in the last 72 hours.  Urinalysis: No results for input(s): COLORURINE, LABSPEC, PHURINE, GLUCOSEU, HGBUR, BILIRUBINUR, KETONESUR, PROTEINUR, UROBILINOGEN, NITRITE, LEUKOCYTESUR in the last 72 hours.  Invalid input(s): APPERANCEUR    Imaging: CT HEAD WO CONTRAST  Result Date: 10/03/2020 CLINICAL DATA:  Altered mental status. EXAM: CT HEAD WITHOUT CONTRAST TECHNIQUE: Contiguous axial images were obtained from the base  of the skull through the vertex without intravenous contrast. COMPARISON:  01/24/2019 FINDINGS: Brain: Motion and streak artifact limit examination. Ventricles and sulci are symmetrical. No mass effect or midline shift. No abnormal extra-axial fluid collections. Gray-white matter junctions are distinct. Basal cisterns are not effaced. No acute intracranial hemorrhage. Vascular: Mild intracranial arterial vascular calcifications. Skull: Calvarium appears intact. Sinuses/Orbits: Mucosal thickening in the paranasal sinuses. No acute air-fluid levels. Mastoid air cells are clear. Other: None. IMPRESSION: No acute intracranial abnormalities. Electronically Signed   By: Lucienne Capers M.D.   On: 10/03/2020 01:05   PERIPHERAL VASCULAR CATHETERIZATION  Result Date: 10/02/2020 See Op Note    Medications:   . cefTRIAXone (ROCEPHIN)  IV 1 g (10/03/20 1300)  . dextrose 30 mL/hr at 10/03/20 1813   . apixaban  2.5 mg Oral BID  . carvedilol  6.25 mg Oral BID WC  . Chlorhexidine Gluconate Cloth  6 each Topical Q0600  . feeding supplement (NEPRO CARB STEADY)  237 mL Oral BID BM  . guaiFENesin  5 mL Oral TID  . pantoprazole  40 mg Oral BID  . sodium chloride flush  3 mL Intravenous Q12H  . sucralfate  1 g Oral TID  . thiamine injection  100 mg Intravenous Daily   acetaminophen **OR** acetaminophen, albuterol, albuterol, clonazePAM, ondansetron **OR** ondansetron (ZOFRAN) IV, sodium chloride flush  Assessment/ Plan:  Mr. Jorey Dollard is a 74 y.o.  male Mr. Nestle is 74 years old male with past medical history of diabetes, hypertension, GERD, renal cell carcinoma post left nephrectomy in 2004, COPD, pancreatic insufficiency, CKD stage III , anemia of CKD sent in to the ED by his PCP for abnormal renal function test and progressively worsening bilateral lower extremity weakness.  # Acute kidney injury on CKD stage IIIa #Metabolic acidosis Patient's baseline creatinine is 2.2/GFR 30 from  06/26/2020  Creatinine 6.80  BUN 83 CO2 21  -Patient  has history of renal cell carcinoma with left nephrectomy in 2004 -Creatinine and  BUN improving -Planning for dialysis today -Will continue assessingrenal function and volume status daily     #Hypokalemia Potassium 3.1 today  Will supplement with IV Potassium   #Anemia of CKD Lab Results  Component Value Date   HGB 7.2 (L) 10/03/2020    We will continue monitoring closely   #Hypoglycemia HbA1C on 09/30/20 4.9 Multiple Hypoglycemic episodes during this admission Patient is on Dextrose 10 % 30 ml/hr Oral intake stays poor Nutritional supplements started yesterday     LOS: 4 Rudy Domek 10/21/20211:20 PM

## 2020-10-04 NOTE — Progress Notes (Signed)
PROGRESS NOTE  [name] TDV:[id number] DOB: 1946-11-06 DOA: 09/30/2020 PCP: Derinda Late, MD   LOS: 4 days   Brief narrative: As per HPI,  [name] is a 74 y.o. 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  He was referred to the emergency room by his PCP because of lower extremity weakness and acute kidney injury. He was found to have acute kidney injury complicated by hyperkalemia, metabolic acidosis, uremic encephalopathy, myoclonic jerks, hyperphosphatemia and hypocalcemia.  He was treated with IV sodium bicarbonate infusion.  Nephrologist was consulted to assist with management.  Unfortunately, kidney function has worsened.  There was concern for fluid overload and had acute hypoxemic respiratory failure requiring oxygen via nasal cannula. Vascular surgeon was consulted for temporary dialysis catheter for hemodialysis. Patient underwent hemodialysis with slight improvement in his mental condition.   Assessment/Plan:  Principal Problem:   AKI (acute kidney injury) ([place]) Active Problems:   Essential hypertension   Type II diabetes mellitus with renal manifestations (HCC)   Anemia in chronic kidney disease   Spinal stenosis, lumbar region, with neurogenic claudication   Hyperkalemia   COPD (chronic obstructive pulmonary disease) ([place])   CKD stage 3 due to type 2 diabetes mellitus ([place])   History of renal cell carcinoma   History of left nephrectomy 2004   Hypoglycemia   Pancreatic insufficiency   Chronic diarrhea   Metabolic acidosis  AKI on CKD stage IIIb with features of metabolic acidosis, myoclonic jerks, hyperkalemia and uremia.  Status post temporary  hemodialysis catheter placement and hemodialysis.  Nephrology on board.  Spoke with nephrology today.  Plan for further hemodialysis today.  Hold sedative, hypnotics.  Off Remeron and Requip.  Gabapentin and Cymbalta were discontinued on 10/01/2020.  BUN has improved.  Hyperphosphatemia and hypocalcemia, mild hypomagnesemia: Replenished today.  Will undergo hemodialysis as well.  Check levels in a.m.  Acute metabolic encephalopathy/uremic encephalopathy: Dialysis as per nephrology.  Improved encephalopathy at this time.  Required mittens since he was trying to pull the catheter.  Restlessness involuntary movements likely secondary to uremia.  Medications have been on hold.  Continue modalities.  Continue Klonopin.  Improved from yesterday.  Fever:  No further sharp spikes.  T-max of 98.9.  On IV Rocephin empirically.  UA however was negative.  No obvious source of infection fever.  Blood cultures been negative in 2 days.  Respiratory panel was negative for Covid and flu.    Acute hypoxemic respiratory failure, currently on 6 L of oxygen by nasal cannula.  Continue modalities for fluid management.  Essential hypertension: Continue carvedilol.  Blood pressure seems to be stable.  History of DVT and PE: Continue Eliquis renal dosing.  Type II DM with recurrent hypoglycemia:  Off glipizide.  Sliding scale insulin on hold.  On IV dextrose due to poor oral intake and hypoglycemia.  Anemia of chronic kidney disease: H&H is stable.  Continue to monitor.  Latest hemoglobin of 7.2.  Chronic pancreatitis/chronic diarrhea: Continue Creon if po able   COPD: As needed bronchodilators.  Chronic back pain: Analgesics as needed for pain but will hold for now due to encephalopathy.  Depression: Remeron and Cymbalta has been discontinued because of AKI  DVT prophylaxis: apixaban (ELIQUIS) tablet 2.5 mg Start: 10/02/20 1000 apixaban (ELIQUIS)  tablet 2.5 mg    Code Status: Full code  Family  Communication: I again spoke with the patient's wife at bedside.   Status is: Inpatient  Remains inpatient appropriate because:Persistent severe electrolyte disturbances, Unsafe d/c plan, IV treatments appropriate due to intensity of illness or inability to take PO, Inpatient level of care appropriate due to severity of illness and Need for hemodialysis   Dispo: The patient is from: Home              Anticipated d/c is to: Home              Anticipated d/c date is: More than 3 days              Patient currently is not medically stable to d/c.   Consultants:  Vascular surgery  Nephrology  Procedures:  Hemodialysis  Antibiotics:  . Rocephin IV 10/19>  Anti-infectives (From admission, onward)   Start     Dose/Rate Route Frequency Ordered Stop   10/02/20 1300  cefTRIAXone (ROCEPHIN) 1 g in sodium chloride 0.9 % 100 mL IVPB        1 g 200 mL/hr over 30 Minutes Intravenous Every 24 hours 10/02/20 1204       Subjective: Today, patient was seen and examined at bedside.  Patient was able to verbalize better today and was answering few questions though he was still confused.  He could not complete his hemodialysis completely yesterday since he was a little agitated and was trying to pull lines.  Objective: Vitals:   10/04/20 0754 10/04/20 0755  BP: (!) 92/57 102/60  Pulse: 68   Resp: 17   Temp: 98.6 F (37 C)   SpO2: 97%     Intake/Output Summary (Last 24 hours) at 10/04/2020 1042 Last data filed at 10/04/2020 1015 Gross per 24 hour  Intake 654.64 ml  Output 1900 ml  Net -1245.36 ml   Filed Weights   10/02/20 2143 10/03/20 0312 10/04/20 0255  Weight: 116 kg 118 kg 116.5 kg   Body mass index is 34.84 kg/m.   Physical Exam:  GENERAL: Patient is alert awake and able to verbalize and communicate better today, mildly restless, on mittens obese HENT: No scleral pallor or icterus. Pupils equally reactive to light. Oral mucosa is dry NECK: is supple, no gross swelling  noted. CHEST: Clear to auscultation. No crackles or wheezes.  Diminished breath sounds bilaterally. CVS: S1 and S2 heard, no murmur. Regular rate and rhythm.  ABDOMEN: Soft, non-tender, bowel sounds are present. EXTREMITIES: Right femoral HD catheter.  Bilateral upper extremity mittens. CNS: Verbalizing and communicating better today, moving all extremities.  Less twitching noted. SKIN: warm and dry without rashes.  Data Review: I have personally reviewed the following laboratory data and studies,  CBC: Recent Labs  Lab 09/30/20 1953 10/01/20 0321 10/02/20 0528 10/03/20 0547  WBC 12.0* 13.6* 9.2 9.4  HGB 9.7* 8.4* 7.6* 7.2*  HCT 30.0* 26.1* 23.9* 22.1*  MCV 97.1 97.4 96.8 95.3  PLT 198 220 188 397   Basic Metabolic Panel: Recent Labs  Lab 09/30/20 1953 10/01/20 0316 10/01/20 0321 10/02/20 0528 10/02/20 1315 10/02/20 1746 10/03/20 0547 10/04/20 0530  NA   < >  --  140 142 140  --  141 141  K   < >  --  5.2* 4.3 4.0  --  3.3* 3.1*  CL   < >  --  114* 112* 111  --  107 105  CO2   < >  --  16* 11* 12*  --  18* 21*  GLUCOSE   < >  --  198* 61* 62*  --  103* 117*  BUN   < >  --  108* 126* 130*  --  97* 83*  CREATININE   < >  --  6.69* 8.12* 8.82*  --  7.20* 6.80*  CALCIUM   < >  --  6.5* 5.7* 5.8*  --  6.3* 6.5*  MG  --  1.6*  --   --   --   --  1.5* 1.6*  PHOS  --  10.4*  --  10.7*  --  11.5* 7.9* 8.2*   < > = values in this interval not displayed.   Liver Function Tests: Recent Labs  Lab 10/02/20 0528 10/03/20 0547  AST 14*  --   ALT 15  --   ALKPHOS 107  --   BILITOT 0.7  --   PROT 5.9*  --   ALBUMIN 3.0* 2.8*   No results for input(s): LIPASE, AMYLASE in the last 168 hours. No results for input(s): AMMONIA in the last 168 hours. Cardiac Enzymes: No results for input(s): CKTOTAL, CKMB, CKMBINDEX, TROPONINI in the last 168 hours. BNP (last 3 results) Recent Labs    03/25/20 0557  BNP 31.0    ProBNP (last 3 results) No results for input(s): PROBNP in  the last 8760 hours.  CBG: Recent Labs  Lab 10/03/20 1806 10/03/20 1958 10/03/20 2329 10/04/20 0315 10/04/20 0751  GLUCAP 98 116* 90 94 118*   Recent Results (from the past 240 hour(s))  Respiratory Panel by RT PCR (Flu A&B, Covid) - Nasopharyngeal Swab     Status: None   Collection Time: 09/30/20  9:07 PM   Specimen: Nasopharyngeal Swab  Result Value Ref Range Status   SARS Coronavirus 2 by RT PCR NEGATIVE NEGATIVE Final    Comment: (NOTE) SARS-CoV-2 target nucleic acids are NOT DETECTED.  The SARS-CoV-2 RNA is generally detectable in upper respiratoy specimens during the acute phase of infection. The lowest concentration of SARS-CoV-2 viral copies this assay can detect is 131 copies/mL. A negative result does not preclude SARS-Cov-2 infection and should not be used as the sole basis for treatment or other patient management decisions. A negative result may occur with  improper specimen collection/handling, submission of specimen other than nasopharyngeal swab, presence of viral mutation(s) within the areas targeted by this assay, and inadequate number of viral copies (<131 copies/mL). A negative result must be combined with clinical observations, patient history, and epidemiological information. The expected result is Negative.  Fact Sheet for Patients:  PinkCheek.be  Fact Sheet for Healthcare Providers:  GravelBags.it  This test is no t yet approved or cleared by the [place] FDA and  has been authorized for detection and/or diagnosis of SARS-CoV-2 by FDA under an Emergency Use Authorization (EUA). This EUA will remain  in effect (meaning this test can be used) for the duration of the COVID-19 declaration under Section 564(b)(1) of the Act, 21 U.S.C. section 360bbb-3(b)(1), unless the authorization is terminated or revoked sooner.     Influenza A by PCR NEGATIVE NEGATIVE Final   Influenza B by PCR  NEGATIVE NEGATIVE Final    Comment: (NOTE) The Xpert Xpress SARS-CoV-2/FLU/RSV assay is intended as an aid in  the diagnosis of influenza from Nasopharyngeal swab specimens and  should not be used as a sole basis for treatment. Nasal washings and  aspirates are unacceptable for Xpert Xpress SARS-CoV-2/FLU/RSV  testing.  Fact Sheet for Patients: PinkCheek.be  Fact Sheet for Healthcare Providers: GravelBags.it  This test is not yet approved or cleared by the [place] FDA and  has been authorized for detection and/or diagnosis of SARS-CoV-2 by  FDA under an Emergency Use Authorization (EUA). This EUA will remain  in effect (meaning this test can be used) for the duration of the  Covid-19 declaration under Section 564(b)(1) of the Act, 21  U.S.C. section 360bbb-3(b)(1), unless the authorization is  terminated or revoked. Performed at [facility], [address] [place], [place] [address]   CULTURE, BLOOD (ROUTINE X 2) w Reflex to ID Panel     Status: None (Preliminary result)   Collection Time: 10/02/20  1:16 PM   Specimen: BLOOD  Result Value Ref Range Status   Specimen Description BLOOD LEFT ANTECUBITAL  Final   Special Requests   Final    BOTTLES DRAWN AEROBIC AND ANAEROBIC Blood Culture adequate volume   Culture   Final    NO GROWTH 2 DAYS Performed at [facility], [address] [place], [place] [address]    Report Status PENDING  Incomplete  CULTURE, BLOOD (ROUTINE X 2) w Reflex to ID Panel     Status: None (Preliminary result)   Collection Time: 10/02/20  1:33 PM   Specimen: BLOOD  Result Value Ref Range Status   Specimen Description BLOOD BLOOD LEFT HAND  Final   Special Requests   Final    BOTTLES DRAWN AEROBIC AND ANAEROBIC Blood Culture adequate volume   Culture   Final    NO GROWTH 2 DAYS Performed at [facility], [address], [place],  [address]    Report  Status PENDING  Incomplete     Studies: CT HEAD WO CONTRAST  Result Date: 10/03/2020 CLINICAL DATA:  Altered mental status. EXAM: CT HEAD WITHOUT CONTRAST TECHNIQUE: Contiguous axial images were obtained from the base of the skull through the vertex without intravenous contrast. COMPARISON:  01/24/2019 FINDINGS: Brain: Motion and streak artifact limit examination. Ventricles and sulci are symmetrical. No mass effect or midline shift. No abnormal extra-axial fluid collections. Gray-white matter junctions are distinct. Basal cisterns are not effaced. No acute intracranial hemorrhage. Vascular: Mild intracranial arterial vascular calcifications. Skull: Calvarium appears intact. Sinuses/Orbits: Mucosal thickening in the paranasal sinuses. No acute air-fluid levels. Mastoid air cells are clear. Other: None. IMPRESSION: No acute intracranial abnormalities. Electronically Signed   By: Lucienne Capers M.D.   On: 10/03/2020 01:05   PERIPHERAL VASCULAR CATHETERIZATION  Result Date: 10/02/2020 See Op Note  DG Chest Port 1 View  Result Date: 10/02/2020 CLINICAL DATA:  Fever. EXAM: PORTABLE CHEST 1 VIEW COMPARISON:  09/30/2020 chest radiograph and prior. FINDINGS: Left predominant bibasilar opacities. No pneumothorax. Trace left pleural effusion. Cardiomediastinal silhouette is unchanged. No acute osseous abnormality. IMPRESSION: Left predominant bibasilar opacities, atelectasis versus infection. Trace left pleural effusion. Electronically Signed   By: Primitivo Gauze M.D.   On: 10/02/2020 13:03      Flora Lipps, MD  [facility] 10/04/2020

## 2020-10-05 DIAGNOSIS — N1832 Chronic kidney disease, stage 3b: Secondary | ICD-10-CM | POA: Diagnosis not present

## 2020-10-05 DIAGNOSIS — K529 Noninfective gastroenteritis and colitis, unspecified: Secondary | ICD-10-CM | POA: Diagnosis not present

## 2020-10-05 DIAGNOSIS — N179 Acute kidney failure, unspecified: Secondary | ICD-10-CM | POA: Diagnosis not present

## 2020-10-05 DIAGNOSIS — E1122 Type 2 diabetes mellitus with diabetic chronic kidney disease: Secondary | ICD-10-CM | POA: Diagnosis not present

## 2020-10-05 LAB — GLUCOSE, CAPILLARY
Glucose-Capillary: 138 mg/dL — ABNORMAL HIGH (ref 70–99)
Glucose-Capillary: 139 mg/dL — ABNORMAL HIGH (ref 70–99)
Glucose-Capillary: 146 mg/dL — ABNORMAL HIGH (ref 70–99)
Glucose-Capillary: 153 mg/dL — ABNORMAL HIGH (ref 70–99)
Glucose-Capillary: 155 mg/dL — ABNORMAL HIGH (ref 70–99)
Glucose-Capillary: 180 mg/dL — ABNORMAL HIGH (ref 70–99)

## 2020-10-05 LAB — BASIC METABOLIC PANEL
Anion gap: 12 (ref 5–15)
BUN: 48 mg/dL — ABNORMAL HIGH (ref 8–23)
CO2: 26 mmol/L (ref 22–32)
Calcium: 7.2 mg/dL — ABNORMAL LOW (ref 8.9–10.3)
Chloride: 100 mmol/L (ref 98–111)
Creatinine, Ser: 5.05 mg/dL — ABNORMAL HIGH (ref 0.61–1.24)
GFR, Estimated: 12 mL/min — ABNORMAL LOW (ref >60)
Glucose, Bld: 152 mg/dL — ABNORMAL HIGH (ref 70–99)
Potassium: 3.1 mmol/L — ABNORMAL LOW (ref 3.5–5.1)
Sodium: 138 mmol/L (ref 135–145)

## 2020-10-05 LAB — CBC WITH DIFFERENTIAL/PLATELET
Abs Immature Granulocytes: 0.03 10*3/uL (ref 0.00–0.07)
Basophils Absolute: 0 10*3/uL (ref 0.0–0.1)
Basophils Relative: 1 %
Eosinophils Absolute: 0.2 10*3/uL (ref 0.0–0.5)
Eosinophils Relative: 3 %
HCT: 21.4 % — ABNORMAL LOW (ref 39.0–52.0)
Hemoglobin: 7 g/dL — ABNORMAL LOW (ref 13.0–17.0)
Immature Granulocytes: 1 %
Lymphocytes Relative: 10 %
Lymphs Abs: 0.6 10*3/uL — ABNORMAL LOW (ref 0.7–4.0)
MCH: 31.5 pg (ref 26.0–34.0)
MCHC: 32.7 g/dL (ref 30.0–36.0)
MCV: 96.4 fL (ref 80.0–100.0)
Monocytes Absolute: 0.6 10*3/uL (ref 0.1–1.0)
Monocytes Relative: 9 %
Neutro Abs: 4.6 10*3/uL (ref 1.7–7.7)
Neutrophils Relative %: 76 %
Platelets: 158 10*3/uL (ref 150–400)
RBC: 2.22 MIL/uL — ABNORMAL LOW (ref 4.22–5.81)
RDW: 14.5 % (ref 11.5–15.5)
WBC: 6 10*3/uL (ref 4.0–10.5)
nRBC: 0 % (ref 0.0–0.2)

## 2020-10-05 LAB — IRON AND TIBC
Iron: 44 ug/dL — ABNORMAL LOW (ref 45–182)
Saturation Ratios: 24 % (ref 17.9–39.5)
TIBC: 183 ug/dL — ABNORMAL LOW (ref 250–450)
UIBC: 139 ug/dL

## 2020-10-05 LAB — PHOSPHORUS: Phosphorus: 5.6 mg/dL — ABNORMAL HIGH (ref 2.5–4.6)

## 2020-10-05 LAB — MAGNESIUM: Magnesium: 1.9 mg/dL (ref 1.7–2.4)

## 2020-10-05 MED ORDER — POTASSIUM CHLORIDE CRYS ER 20 MEQ PO TBCR
40.0000 meq | EXTENDED_RELEASE_TABLET | Freq: Once | ORAL | Status: AC
  Administered 2020-10-05: 40 meq via ORAL
  Filled 2020-10-05: qty 2

## 2020-10-05 MED ORDER — GABAPENTIN 100 MG PO CAPS
100.0000 mg | ORAL_CAPSULE | Freq: Every day | ORAL | Status: DC
  Administered 2020-10-05 – 2020-10-11 (×7): 100 mg via ORAL
  Filled 2020-10-05 (×7): qty 1

## 2020-10-05 MED ORDER — ROPINIROLE HCL 1 MG PO TABS
2.0000 mg | ORAL_TABLET | Freq: Two times a day (BID) | ORAL | Status: DC
  Administered 2020-10-05 – 2020-10-07 (×5): 2 mg via ORAL
  Filled 2020-10-05 (×5): qty 2

## 2020-10-05 MED ORDER — LORAZEPAM 2 MG/ML IJ SOLN
1.0000 mg | Freq: Once | INTRAMUSCULAR | Status: AC
  Administered 2020-10-05: 1 mg via INTRAVENOUS

## 2020-10-05 MED ORDER — LORAZEPAM 2 MG/ML IJ SOLN
INTRAMUSCULAR | Status: AC
  Filled 2020-10-05: qty 1

## 2020-10-05 MED ORDER — MIRTAZAPINE 15 MG PO TABS
15.0000 mg | ORAL_TABLET | Freq: Every day | ORAL | Status: DC
  Administered 2020-10-05 – 2020-10-06 (×2): 15 mg via ORAL
  Filled 2020-10-05 (×2): qty 1

## 2020-10-05 MED ORDER — LORAZEPAM 2 MG/ML IJ SOLN
1.0000 mg | Freq: Once | INTRAMUSCULAR | Status: AC
  Administered 2020-10-05: 1 mg via INTRAVENOUS
  Filled 2020-10-05: qty 1

## 2020-10-05 NOTE — Care Management Important Message (Signed)
Important Message  Patient Details  Name: [name] MRN: [id number] Date of Birth: 01-10-1946   Medicare Important Message Given:  Yes     Dannette Barbara 10/05/2020, 12:19 PM

## 2020-10-05 NOTE — Progress Notes (Signed)
Mobility Specialist - Progress Note   10/05/20 1600  Mobility  Activity Transferred to/from [facility]  Level of Assistance Moderate assist, patient does 50-74%  Assistive Device Front wheel walker  Distance Ambulated (ft) 3 ft  Mobility Response Tolerated well  Mobility performed by Mobility specialist  $Mobility charge 1 Mobility    Pt was sitting on BSC upon arrival with wife and NT present in room. Nurse was also present for transfer. Pt c/o pain in foot, but was able to tolerate weight bearing. Pt was able to stand from [facility] with modA +2. Nurse and NT assisted with pt's hygiene. Pt turned R with RW and ambulated backwards 3' with minA +1. VC were needed for hand placement, keeping RW close to body, and safe sitting. CGA was used for safety. Overall, pt tolerated session well. Pt was left EOB with needs in reach. Nurse and tech still present after exit.    Kathee Delton Mobility Specialist 10/05/20, 4:18 PM

## 2020-10-05 NOTE — Progress Notes (Signed)
  Speech Language Pathology Treatment: Dysphagia  Patient Details Name: [name] MRN: [id number] DOB: 05-06-1946 Today's Date: 10/05/2020 Time: 3662-9476 SLP Time Calculation (min) (ACUTE ONLY): 30 min  Assessment / Plan / Recommendation Clinical Impression  Skilled treatment focused on on-going diet management. Pt's mentation continues to improve as well as his oropharyngeal abilities. Pt consumed regular diet textures with thin liquids via straw with no overt s/s of dysphagia or aspiration. At this time, recommend diet upgrade to regular with thin liquids via straw or cup, medicine whole with thin liquids. ST to sign off at this time.    HPI HPI: Kin Galbraith is a 74 y.o. 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  He was referred to the emergency room by his PCP because of lower extremity weakness and acute kidney injury. He was found to have acute kidney injury complicated by hyperkalemia, metabolic acidosis, uremic encephalopathy, myoclonic jerks, hyperphosphatemia and hypocalcemia. Rapid Response called on 10/02/2020 d/t nonresponsiveness. Head CT on 10/03/2020 didn't reveal any acute intracranial abnormalities. Chest x-ray on 10/02/2020 revealed left predominant bibasilar opacities, atelectasis versus infection and trace left pleural effusion.      SLP Plan  Discharge SLP treatment due to (comment) (goals met)       Recommendations  Diet recommendations: Regular;Thin liquid Liquids provided via: Straw;Cup Medication Administration: Whole meds with liquid Supervision: Intermittent supervision to cue for compensatory strategies Compensations: Minimize environmental  distractions;Slow rate;Small sips/bites Postural Changes and/or Swallow Maneuvers: Seated upright 90 degrees;Upright 30-60 min after meal                Oral Care Recommendations: Oral care BID Follow up Recommendations: None SLP Visit Diagnosis: Dysphagia, oropharyngeal phase (R13.12) Plan: Discharge SLP treatment due to (comment) (goals met)       GO               Meklit Cotta B. Rutherford Nail M.S., CCC-SLP, [place] Office [phone]  [name] 10/05/2020, 2:36 PM

## 2020-10-05 NOTE — Progress Notes (Signed)
[facility]  ROUNDING NOTE   Subjective:   Mr. Ferencz is 74 years old male with past medical history of diabetes, hypertension, GERD, renal cell carcinoma post left nephrectomy in 2004, COPD, pancreatic insufficiency, CKD stage III , anemia of CKD sent in to the ED by his PCP for abnormal renal function test and progressively worsening bilateral lower extremity weakness. Patient's baseline creatinine is 2.2/GFR 30 from 06/26/2020  Patient received dialysis treatment yesterday, tolerated well. Urine output for the preceding 24 hours is 2000 ml Patient's mental status and respiratory status appear as improving.He verbalized generalized anxiety and rest leg syndrome.Wife at the bedside, reports he still has poor oral intake. Speech therapy recommended Dysphagia 3 mechanical soft diet with thin liquids.  Objective:  Vital signs in last 24 hours:  Temp:  [97.8 F (36.6 C)-98.8 F (37.1 C)] 97.9 F (36.6 C) (10/22 1122) Pulse Rate:  [72-84] 72 (10/22 1122) Resp:  [18-19] 18 (10/22 1122) BP: (110-133)/(58-69) 126/69 (10/22 1122) SpO2:  [95 %-100 %] 97 % (10/22 1122)  Weight change:  Filed Weights   10/02/20 2143 10/03/20 0312 10/04/20 0255  Weight: 116 kg 118 kg 116.5 kg    Intake/Output: I/O last 3 completed shifts: In: 994.6 [P.O.:240; I.V.:554.6; IV Piggyback:200] Out: 2001 [Urine:2000; Stool:1]   Intake/Output this shift:  Total I/O In: 240 [P.O.:240] Out: 0   Physical Exam: General:  Awake,alert,pleasant  Head: Moist oral mucosal membranes  Eyes: Anicteric  Lungs:   Lungs diminished at the bases, 4L O2 via Nasal cannula,Respiration even,unlabored  Heart: S1S2 Regular rate and rhythm  Abdomen:  Soft, nontender, distended  Extremities:  No peripheral edema.  Neurologic:  Alert,Forgetful,speech clear and appropriate   Skin: No acute lesions or rashes  RT.Femoral Catheter  Basic Metabolic Panel: Recent Labs  Lab 10/01/20 0316 10/01/20 0321  10/02/20 0528 10/02/20 0528 10/02/20 1315 10/02/20 1315 10/02/20 1746 10/03/20 0547 10/04/20 0530 10/05/20 0600  NA  --    < > 142  --  140  --   --  141 141 138  K  --    < > 4.3  --  4.0  --   --  3.3* 3.1* 3.1*  CL  --    < > 112*  --  111  --   --  107 105 100  CO2  --    < > 11*  --  12*  --   --  18* 21* 26  GLUCOSE  --    < > 61*  --  62*  --   --  103* 117* 152*  BUN  --    < > 126*  --  130*  --   --  97* 83* 48*  CREATININE  --    < > 8.12*  --  8.82*  --   --  7.20* 6.80* 5.05*  CALCIUM  --    < > 5.7*   < > 5.8*   < >  --  6.3* 6.5* 7.2*  MG 1.6*  --   --   --   --   --   --  1.5* 1.6* 1.9  PHOS 10.4*   < > 10.7*  --   --   --  11.5* 7.9* 8.2* 5.6*   < > = values in this interval not displayed.    Liver Function Tests: Recent Labs  Lab 10/02/20 0528 10/03/20 0547  AST 14*  --   ALT 15  --   ALKPHOS  107  --   BILITOT 0.7  --   PROT 5.9*  --   ALBUMIN 3.0* 2.8*   No results for input(s): LIPASE, AMYLASE in the last 168 hours. No results for input(s): AMMONIA in the last 168 hours.  CBC: Recent Labs  Lab 09/30/20 1953 10/01/20 0321 10/02/20 0528 10/03/20 0547 10/05/20 0600  WBC 12.0* 13.6* 9.2 9.4 6.0  NEUTROABS  --   --   --   --  4.6  HGB 9.7* 8.4* 7.6* 7.2* 7.0*  HCT 30.0* 26.1* 23.9* 22.1* 21.4*  MCV 97.1 97.4 96.8 95.3 96.4  PLT 198 220 188 170 158    Cardiac Enzymes: No results for input(s): CKTOTAL, CKMB, CKMBINDEX, TROPONINI in the last 168 hours.  BNP: Invalid input(s): POCBNP  CBG: Recent Labs  Lab 10/04/20 2051 10/05/20 0028 10/05/20 0318 10/05/20 0902 10/05/20 1119  GLUCAP 132* 146* 138* 139* 155*    Microbiology: Results for orders placed or performed during the hospital encounter of 09/30/20  Respiratory Panel by RT PCR (Flu A&B, Covid) - Nasopharyngeal Swab     Status: None   Collection Time: 09/30/20  9:07 PM   Specimen: Nasopharyngeal Swab  Result Value Ref Range Status   SARS Coronavirus 2 by RT PCR NEGATIVE  NEGATIVE Final    Comment: (NOTE) SARS-CoV-2 target nucleic acids are NOT DETECTED.  The SARS-CoV-2 RNA is generally detectable in upper respiratoy specimens during the acute phase of infection. The lowest concentration of SARS-CoV-2 viral copies this assay can detect is 131 copies/mL. A negative result does not preclude SARS-Cov-2 infection and should not be used as the sole basis for treatment or other patient management decisions. A negative result may occur with  improper specimen collection/handling, submission of specimen other than nasopharyngeal swab, presence of viral mutation(s) within the areas targeted by this assay, and inadequate number of viral copies (<131 copies/mL). A negative result must be combined with clinical observations, patient history, and epidemiological information. The expected result is Negative.  Fact Sheet for Patients:  PinkCheek.be  Fact Sheet for Healthcare Providers:  GravelBags.it  This test is no t yet approved or cleared by the [place] FDA and  has been authorized for detection and/or diagnosis of SARS-CoV-2 by FDA under an Emergency Use Authorization (EUA). This EUA will remain  in effect (meaning this test can be used) for the duration of the COVID-19 declaration under Section 564(b)(1) of the Act, 21 U.S.C. section 360bbb-3(b)(1), unless the authorization is terminated or revoked sooner.     Influenza A by PCR NEGATIVE NEGATIVE Final   Influenza B by PCR NEGATIVE NEGATIVE Final    Comment: (NOTE) The Xpert Xpress SARS-CoV-2/FLU/RSV assay is intended as an aid in  the diagnosis of influenza from Nasopharyngeal swab specimens and  should not be used as a sole basis for treatment. Nasal washings and  aspirates are unacceptable for Xpert Xpress SARS-CoV-2/FLU/RSV  testing.  Fact Sheet for Patients: PinkCheek.be  Fact Sheet for Healthcare  Providers: GravelBags.it  This test is not yet approved or cleared by the [place] FDA and  has been authorized for detection and/or diagnosis of SARS-CoV-2 by  FDA under an Emergency Use Authorization (EUA). This EUA will remain  in effect (meaning this test can be used) for the duration of the  Covid-19 declaration under Section 564(b)(1) of the Act, 21  U.S.C. section 360bbb-3(b)(1), unless the authorization is  terminated or revoked. Performed at [facility], [address] [place], [place] [address]  CULTURE, BLOOD (ROUTINE X 2) w Reflex to ID Panel     Status: None (Preliminary result)   Collection Time: 10/02/20  1:16 PM   Specimen: BLOOD  Result Value Ref Range Status   Specimen Description BLOOD LEFT ANTECUBITAL  Final   Special Requests   Final    BOTTLES DRAWN AEROBIC AND ANAEROBIC Blood Culture adequate volume   Culture   Final    NO GROWTH 3 DAYS Performed at [facility], [address], [place], [place] [address]    Report Status PENDING  Incomplete  CULTURE, BLOOD (ROUTINE X 2) w Reflex to ID Panel     Status: None (Preliminary result)   Collection Time: 10/02/20  1:33 PM   Specimen: BLOOD  Result Value Ref Range Status   Specimen Description BLOOD BLOOD LEFT HAND  Final   Special Requests   Final    BOTTLES DRAWN AEROBIC AND ANAEROBIC Blood Culture adequate volume   Culture   Final    NO GROWTH 3 DAYS Performed at [facility], [address] [place], [place] [address]    Report Status PENDING  Incomplete    Coagulation Studies: No results for input(s): LABPROT, INR in the last 72 hours.  Urinalysis: No results for input(s): COLORURINE, LABSPEC, PHURINE, GLUCOSEU, HGBUR, BILIRUBINUR, KETONESUR, PROTEINUR, UROBILINOGEN, NITRITE, LEUKOCYTESUR in the last 72 hours.  Invalid input(s): APPERANCEUR    Imaging: No results found.   Medications:    . apixaban  2.5 mg Oral BID  .  carvedilol  6.25 mg Oral BID WC  . Chlorhexidine Gluconate Cloth  6 each Topical Q0600  . feeding supplement (NEPRO CARB STEADY)  237 mL Oral BID BM  . gabapentin  100 mg Oral QHS  . guaiFENesin  5 mL Oral TID  . lipase/protease/amylase  72,000 Units Oral TID WC  . LORazepam      . pantoprazole  40 mg Oral BID  . rOPINIRole  2 mg Oral BID  . sodium chloride flush  3 mL Intravenous Q12H  . sucralfate  1 g Oral TID  . thiamine injection  100 mg Intravenous Daily   acetaminophen **OR** acetaminophen, albuterol, albuterol, clonazePAM, ondansetron **OR** ondansetron (ZOFRAN) IV, sodium chloride flush  Assessment/ Plan:  Mr. Engelbert Sevin is a 74 y.o.  male Mr. Aker is 74 years old male with past medical history of diabetes, hypertension, GERD, renal cell carcinoma post left nephrectomy in 2004, COPD, pancreatic insufficiency, CKD stage III , anemia of CKD sent in to the ED by his PCP for abnormal renal function test and progressively worsening bilateral lower extremity weakness.  # Acute kidney injury on CKD stage IIIa #Metabolic acidosis -Patient  has history of renal cell carcinoma with left nephrectomy in 2004 Patient's baseline creatinine is 2.2/GFR 30 from 06/26/2020 Creatinine improving gradually 5.05 today BUN 48 CO2 26 -Urine Output for the preceding 24 hours is 2000 ml -Not planning dialysis today -Will continue assessing renal function and volume status daily   #Hypokalemia Potassium 3.1  PO Potassium supplements   #Anemia of CKD Lab Results  Component Value Date   HGB 7.0 (L) 10/05/2020     #Hypoglycemia HbA1C on 09/30/20 4.9 Multiple Hypoglycemic episodes during this admission Patient is on Dextrose 10 % 30 ml/hr Blood sugar readings within acceptable range today      LOS: 5 Vada Swift 10/22/202112:04 PM

## 2020-10-05 NOTE — Progress Notes (Signed)
PROGRESS NOTE  [name] KVQ:[id number] DOB: 1946/10/06 DOA: 09/30/2020 PCP: Derinda Late, MD   LOS: 5 days   Brief narrative: As per HPI,  [name] is a 74 y.o. 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  He was referred to the emergency room by his PCP because of lower extremity weakness and acute kidney injury. He was found to have acute kidney injury complicated by hyperkalemia, metabolic acidosis, uremic encephalopathy, myoclonic jerks, hyperphosphatemia and hypocalcemia.  He was treated with IV sodium bicarbonate infusion.  Nephrologist was consulted to assist with management.  Unfortunately, kidney function has worsened.  There was concern for fluid overload and had acute hypoxemic respiratory failure requiring oxygen via nasal cannula. Vascular surgeon was consulted for temporary dialysis catheter for hemodialysis. Patient underwent hemodialysis with  improvement in his mental condition.     Assessment/Plan:  Principal Problem:   AKI (acute kidney injury) ([place]) Active Problems:   Essential hypertension   Type II diabetes mellitus with renal manifestations (H[facility])   Anemia in chronic kidney disease   Spinal stenosis, lumbar region, with neurogenic claudication   Hyperkalemia   COPD (chronic obstructive pulmonary disease) ([place])   CKD stage 3 due to type 2 diabetes mellitus ([place])   History of renal cell carcinoma   History of left nephrectomy 2004   Hypoglycemia   Pancreatic insufficiency   Chronic diarrhea   Metabolic acidosis  AKI on CKD stage IIIb with features of metabolic acidosis, myoclonic jerks, hyperkalemia and uremia on presentation..  Status post  temporary hemodialysis catheter placement and hemodialysis.  Mentation has started to improve including involuntary movements.  Continue to hold isolated hypnotics- Remeron but restart Requip restless legs. Restart low-dose gabapentin 100 mg at nighttime. Continue to hold Cymbalta. .  Continue Klonopin for now.  Hyperphosphatemia hypocalcemia, mild hypomagnesemia: Potassium of 3.1 today.  Will replenish orally..  Acute metabolic encephalopathy/uremic encephalopathy: Continue hemodialysis. Improving.  Fever:  Has resolved.  No obvious source of infection to follow. On IV Rocephin empirically.  UA however was negative.  No obvious source of infection fever.  Blood cultures been negative in 2 days.  Respiratory panel was negative for Covid and flu.  Will discontinue Rocephin.   Acute hypoxemic respiratory failure, currently on nasal cannula.  Continue hemodialysis for fluid management. Wean oxygen as able.  Essential hypertension: Continue carvedilol.  Blood pressure seems to be stable.  History of DVT and PE: Continue Eliquis renal dosing.  Type II DM with recurrent hypoglycemia:  Off glipizide.  Sliding scale insulin on hold.  On IV dextrose due to poor oral intake and hypoglycemia.  Patient has been started on oral diet since yesterday.  Will closely monitor blood glucose levels.  Latest POC glucose of 130. Discontinue dextrose if blood glucose levels remain stable.  Anemia of chronic kidney disease:   Continue to monitor.  Latest hemoglobin of 7.0.  EPO/iron as per nephrology.  Will order for serum ferritin TIBC and serum iron levels today.  Chronic pancreatitis/chronic diarrhea: Continue Creon   COPD: As needed bronchodilators.  compensated.   Chronic back pain: Hold off with narcotics for now.   Depression: Remeron and Cymbalta has been discontinued because of AKI and encephalopathy. Will resume as clinically improved.  DVT prophylaxis: apixaban (ELIQUIS)  tablet 2.5 mg Start:  10/02/20 1000 apixaban (ELIQUIS) tablet 2.5 mg     Code Status: Full code  Family Communication: I again spoke with the patient's wife at bedside and updated her about the clinical condition of the patient. Spoke with nephrology at bedside as well  Status is: Inpatient  Remains inpatient appropriate because:Persistent severe electrolyte disturbances, Unsafe d/c plan, IV treatments appropriate due to intensity of illness or inability to take PO, Inpatient level of care appropriate due to severity of illness and Need for hemodialysis, ongoing uremic encephalopathy,   Dispo: The patient is from: Home              Anticipated d/c is to: Home/undetermined              Anticipated d/c date is: 2 to 3 days              Patient currently is not medically stable to d/c.   Consultants:  Vascular surgery  Nephrology  Procedures:  Hemodialysis  Antibiotics:  . Rocephin IV 10/19> will discontinue  Anti-infectives (From admission, onward)   Start     Dose/Rate Route Frequency Ordered Stop   10/02/20 1300  cefTRIAXone (ROCEPHIN) 1 g in sodium chloride 0.9 % 100 mL IVPB        1 g 200 mL/hr over 30 Minutes Intravenous Every 24 hours 10/02/20 1204       Subjective: Today, patient was seen and examined at bedside. Patient is more alert awake and communicative. No involuntary tremors or confusion reported. Slightly hard of hearing.   Objective: Vitals:   10/04/20 1640 10/04/20 1929  BP: 122/66 (!) 115/59  Pulse: 80 79  Resp: 19 19  Temp: 98.4 F (36.9 C) 98.8 F (37.1 C)  SpO2: 100% 95%    Intake/Output Summary (Last 24 hours) at 10/05/2020 0824 Last data filed at 10/04/2020 1850 Gross per 24 hour  Intake 432 ml  Output 2001 ml  Net -1569 ml   Filed Weights   10/02/20 2143 10/03/20 0312 10/04/20 0255  Weight: 116 kg 118 kg 116.5 kg   Body mass index is 34.84 kg/m.   Physical Exam:  General: Alert awake and communicative. Hard of hearing. Answering appropriately.  Obese HENT:   No scleral pallor or icterus noted. Oral mucosa is moist.  Chest:  Clear breath sounds.  Diminished breath sounds bilaterally. No crackles or wheezes.  CVS: S1 &S2 heard. No murmur.  Regular rate and rhythm. Abdomen: Soft, nontender, nondistended.  Bowel sounds are heard.   Extremities: Right femoral hemodialysis catheter in place. Psych: Awake and communicative. Calm mood. Answering appropriately. CNS: No focal neurological deficits. Moving all extremities. Skin: Warm and dry.  No rashes noted.  Data Review: I have personally reviewed the following laboratory data and studies,  CBC: Recent Labs  Lab 09/30/20 1953 10/01/20 0321 10/02/20 0528 10/03/20 0547 10/05/20 0600  WBC 12.0* 13.6* 9.2 9.4 6.0  NEUTROABS  --   --   --   --  4.6  HGB 9.7* 8.4* 7.6* 7.2* 7.0*  HCT 30.0* 26.1* 23.9* 22.1* 21.4*  MCV 97.1 97.4 96.8 95.3 96.4  PLT 198 220 188 170 154   Basic Metabolic Panel: Recent Labs  Lab 10/01/20 0316 10/01/20 0321 10/02/20 0528 10/02/20 1315 10/02/20 1746 10/03/20 0547 10/04/20 0530 10/05/20 0600  NA  --    < > 142 140  --  141 141 138  K  --    < > 4.3 4.0  --  3.3* 3.1* 3.1*  CL  --    < > 112* 111  --  107 105 100  CO2  --    < > 11* 12*  --  18* 21* 26  GLUCOSE  --    < > 61* 62*  --  103* 117* 152*  BUN  --    < > 126* 130*  --  97* 83* 48*  CREATININE  --    < > 8.12* 8.82*  --  7.20* 6.80* 5.05*  CALCIUM  --    < > 5.7* 5.8*  --  6.3* 6.5* 7.2*  MG 1.6*  --   --   --   --  1.5* 1.6* 1.9  PHOS 10.4*   < > 10.7*  --  11.5* 7.9* 8.2* 5.6*   < > = values in this interval not displayed.   Liver Function Tests: Recent Labs  Lab 10/02/20 0528 10/03/20 0547  AST 14*  --   ALT 15  --   ALKPHOS 107  --   BILITOT 0.7  --   PROT 5.9*  --   ALBUMIN 3.0* 2.8*   No results for input(s): LIPASE, AMYLASE in the last 168 hours. No results for input(s): AMMONIA in the last 168 hours. Cardiac Enzymes: No results for input(s): CKTOTAL, CKMB,  CKMBINDEX, TROPONINI in the last 168 hours. BNP (last 3 results) Recent Labs    03/25/20 0557  BNP 31.0    ProBNP (last 3 results) No results for input(s): PROBNP in the last 8760 hours.  CBG: Recent Labs  Lab 10/04/20 1701 10/04/20 1958 10/04/20 2051 10/05/20 0028 10/05/20 0318  GLUCAP 92 145* 132* 146* 138*   Recent Results (from the past 240 hour(s))  Respiratory Panel by RT PCR (Flu A&B, Covid) - Nasopharyngeal Swab     Status: None   Collection Time: 09/30/20  9:07 PM   Specimen: Nasopharyngeal Swab  Result Value Ref Range Status   SARS Coronavirus 2 by RT PCR NEGATIVE NEGATIVE Final    Comment: (NOTE) SARS-CoV-2 target nucleic acids are NOT DETECTED.  The SARS-CoV-2 RNA is generally detectable in upper respiratoy specimens during the acute phase of infection. The lowest concentration of SARS-CoV-2 viral copies this assay can detect is 131 copies/mL. A negative result does not preclude SARS-Cov-2 infection and should not be used as the sole basis for treatment or other patient management decisions. A negative result may occur with  improper specimen collection/handling, submission of specimen other than nasopharyngeal swab, presence of viral mutation(s) within the areas targeted by this assay, and inadequate number of viral copies (<131 copies/mL). A negative result must be combined with clinical observations, patient history, and epidemiological information. The expected result is Negative.  Fact Sheet for Patients:  PinkCheek.be  Fact Sheet for Healthcare Providers:  GravelBags.it  This test is no t yet approved or cleared by the [place] FDA and  has been authorized for detection and/or diagnosis of SARS-CoV-2 by FDA under an Emergency Use Authorization (EUA). This EUA will remain  in effect (meaning this test can be used) for the duration of the COVID-19 declaration under Section 564(b)(1) of  the Act, 21 U.S.C. section 360bbb-3(b)(1), unless the authorization is terminated or revoked sooner.     Influenza A by PCR NEGATIVE NEGATIVE Final   Influenza B by PCR NEGATIVE NEGATIVE Final    Comment: (NOTE) The Xpert Xpress SARS-CoV-2/FLU/RSV assay is intended as an aid in  the diagnosis of influenza from Nasopharyngeal swab  specimens and  should not be used as a sole basis for treatment. Nasal washings and  aspirates are unacceptable for Xpert Xpress SARS-CoV-2/FLU/RSV  testing.  Fact Sheet for Patients: PinkCheek.be  Fact Sheet for Healthcare Providers: GravelBags.it  This test is not yet approved or cleared by the [place] FDA and  has been authorized for detection and/or diagnosis of SARS-CoV-2 by  FDA under an Emergency Use Authorization (EUA). This EUA will remain  in effect (meaning this test can be used) for the duration of the  Covid-19 declaration under Section 564(b)(1) of the Act, 21  U.S.C. section 360bbb-3(b)(1), unless the authorization is  terminated or revoked. Performed at [facility], [address] [place], [place] [address]   CULTURE, BLOOD (ROUTINE X 2) w Reflex to ID Panel     Status: None (Preliminary result)   Collection Time: 10/02/20  1:16 PM   Specimen: BLOOD  Result Value Ref Range Status   Specimen Description BLOOD LEFT ANTECUBITAL  Final   Special Requests   Final    BOTTLES DRAWN AEROBIC AND ANAEROBIC Blood Culture adequate volume   Culture   Final    NO GROWTH 3 DAYS Performed at [facility], [address] [place], [place] [address]    Report Status PENDING  Incomplete  CULTURE, BLOOD (ROUTINE X 2) w Reflex to ID Panel     Status: None (Preliminary result)   Collection Time: 10/02/20  1:33 PM   Specimen: BLOOD  Result Value Ref Range Status   Specimen Description BLOOD BLOOD LEFT HAND  Final   Special Requests   Final    BOTTLES DRAWN AEROBIC  AND ANAEROBIC Blood Culture adequate volume   Culture   Final    NO GROWTH 3 DAYS Performed at [facility], [address] [place], [place] [address]    Report Status PENDING  Incomplete     Studies: No results found.    Flora Lipps, MD  [facility] 10/05/2020

## 2020-10-06 DIAGNOSIS — N1832 Chronic kidney disease, stage 3b: Secondary | ICD-10-CM | POA: Diagnosis not present

## 2020-10-06 DIAGNOSIS — K529 Noninfective gastroenteritis and colitis, unspecified: Secondary | ICD-10-CM | POA: Diagnosis not present

## 2020-10-06 DIAGNOSIS — N179 Acute kidney failure, unspecified: Secondary | ICD-10-CM | POA: Diagnosis not present

## 2020-10-06 DIAGNOSIS — E1122 Type 2 diabetes mellitus with diabetic chronic kidney disease: Secondary | ICD-10-CM | POA: Diagnosis not present

## 2020-10-06 LAB — PHOSPHORUS: Phosphorus: 6.3 mg/dL — ABNORMAL HIGH (ref 2.5–4.6)

## 2020-10-06 LAB — CBC
HCT: 20.9 % — ABNORMAL LOW (ref 39.0–52.0)
Hemoglobin: 6.5 g/dL — ABNORMAL LOW (ref 13.0–17.0)
MCH: 30.2 pg (ref 26.0–34.0)
MCHC: 31.1 g/dL (ref 30.0–36.0)
MCV: 97.2 fL (ref 80.0–100.0)
Platelets: 169 10*3/uL (ref 150–400)
RBC: 2.15 MIL/uL — ABNORMAL LOW (ref 4.22–5.81)
RDW: 14.2 % (ref 11.5–15.5)
WBC: 5.1 10*3/uL (ref 4.0–10.5)
nRBC: 0 % (ref 0.0–0.2)

## 2020-10-06 LAB — GLUCOSE, CAPILLARY
Glucose-Capillary: 134 mg/dL — ABNORMAL HIGH (ref 70–99)
Glucose-Capillary: 139 mg/dL — ABNORMAL HIGH (ref 70–99)
Glucose-Capillary: 147 mg/dL — ABNORMAL HIGH (ref 70–99)
Glucose-Capillary: 158 mg/dL — ABNORMAL HIGH (ref 70–99)

## 2020-10-06 LAB — BASIC METABOLIC PANEL
Anion gap: 11 (ref 5–15)
BUN: 59 mg/dL — ABNORMAL HIGH (ref 8–23)
CO2: 26 mmol/L (ref 22–32)
Calcium: 7.2 mg/dL — ABNORMAL LOW (ref 8.9–10.3)
Chloride: 106 mmol/L (ref 98–111)
Creatinine, Ser: 6.2 mg/dL — ABNORMAL HIGH (ref 0.61–1.24)
GFR, Estimated: 9 mL/min — ABNORMAL LOW (ref >60)
Glucose, Bld: 167 mg/dL — ABNORMAL HIGH (ref 70–99)
Potassium: 3.6 mmol/L (ref 3.5–5.1)
Sodium: 143 mmol/L (ref 135–145)

## 2020-10-06 LAB — HEMOGLOBIN AND HEMATOCRIT, BLOOD
HCT: 24.7 % — ABNORMAL LOW (ref 39.0–52.0)
Hemoglobin: 8 g/dL — ABNORMAL LOW (ref 13.0–17.0)

## 2020-10-06 LAB — FERRITIN: Ferritin: 182 ng/mL (ref 24–336)

## 2020-10-06 LAB — PREPARE RBC (CROSSMATCH)

## 2020-10-06 LAB — ABO/RH: ABO/RH(D): O POS

## 2020-10-06 LAB — MAGNESIUM: Magnesium: 2 mg/dL (ref 1.7–2.4)

## 2020-10-06 MED ORDER — SODIUM CHLORIDE 0.9% IV SOLUTION: Freq: Once | INTRAVENOUS | Status: AC

## 2020-10-06 MED ORDER — RAMELTEON 8 MG PO TABS
8.0000 mg | ORAL_TABLET | Freq: Every day | ORAL | Status: DC
  Filled 2020-10-06: qty 1

## 2020-10-06 NOTE — Progress Notes (Signed)
PROGRESS NOTE  [name] SJG:[id number] DOB: 1946/07/21 DOA: 09/30/2020 PCP: Derinda Late, MD   LOS: 6 days   Brief narrative: As per HPI,  [name] is a 74 y.o. 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  He was referred to the emergency room by his PCP because of lower extremity weakness and acute kidney injury. He was found to have acute kidney injury complicated by hyperkalemia, metabolic acidosis, uremic encephalopathy, myoclonic jerks, hyperphosphatemia and hypocalcemia.  He was treated with IV sodium bicarbonate infusion.  Nephrologist was consulted to assist with management.  Patient did have worsening kidney function with fluid overload and hypoxic respiratory failure.  Vascular surgery was consulted for a temporary dialysis catheter placement.  Patient underwent hemodialysis with improvement in his metabolic encephalopathy.     Assessment/Plan:  Principal Problem:   AKI (acute kidney injury) ([place]) Active Problems:   Essential hypertension   Type II diabetes mellitus with renal manifestations (HCC)   Anemia in chronic kidney disease   Spinal stenosis, lumbar region, with neurogenic claudication   Hyperkalemia   COPD (chronic obstructive pulmonary disease) ([place])   CKD stage 3 due to type 2 diabetes mellitus ([place])   History of renal cell carcinoma   History of left nephrectomy 2004   Hypoglycemia   Pancreatic insufficiency   Chronic diarrhea   Metabolic acidosis  AKI on CKD stage IIIb on presentation with features of metabolic acidosis, myoclonic jerks, hyperkalemia and uremia on presentation. Status post temporary hemodialysis catheter placement and  hemodialysis.  Improved mentation at this time.  Spoke with nephrology today.  Plan for hemodialysis today.   Acute metabolic encephalopathy/uremic encephalopathy: Improving.  Continue hemodialysis. . Some of the medications have been resumed.resumed low-dose gabapentin, Cymbalta is still on hold.  Continue Klonopin.  Improved mentation.  Added low-dose Remeron yesterday.  Receiving thiamine IV daily.  Fever:  Has resolved.  No source of infection identified so far.  Blood cultures negative. IV Rocephin has been discontinued.  No leukocytosis.   Acute hypoxemic respiratory failure, currently on nasal cannula.  On 5 L of nasal count oxygen.    Hyperphosphatemia, hypocalcemia, mild hypomagnesemia: Potassium of 3.6 today.  Replenished yesterday.  Essential hypertension: Continue carvedilol.  Blood pressure is stable at this time.  History of DVT and PE: Continue Eliquis renal dosing.  Would benefit from same dosing on discharge.  Type II DM with recurrent hypoglycemia:  Off glipizide.  Initially received IV dextrose.  Has been restarted on oral diet.  Will consider sliding scale insulin as needed.  Latest POC glucose of 139.  Anemia of chronic kidney disease:   Continue to monitor.  Latest hemoglobin of 6.5..  EPO/iron as per nephrology.  Serum iron of 44, TIBC 183.  Ferritin of 182.  Iron saturation at 24%.  Patient would benefit from IV iron as per nephrology.  He might benefit from from 1 unit of packed RBC today.  Blood consent obtained.  .Chronic pancreatitis/chronic diarrhea: Continue Creon   COPD: As needed bronchodilators.  compensated.   Chronic back pain: Hold off with narcotics for now.  Resume as necessary.  Depression: Low-dose Remeron was initiated yesterday.  Cymbalta still on hold.  Will resume as clinically improved.  DVT prophylaxis: apixaban (ELIQUIS) tablet 2.5 mg  Start: 10/02/20 1000 apixaban (ELIQUIS) tablet 2.5 mg     Code Status: Full code  Family  Communication:  Spoke with the patient's wife on the phone.  Status is: Inpatient  Remains inpatient appropriate because:Persistent severe electrolyte disturbances, Unsafe d/c plan, IV treatments appropriate due to intensity of illness or inability to take PO, Inpatient level of care appropriate due to severity of illness and Need for hemodialysis, ongoing uremic encephalopathy,   Dispo: The patient is from: Home              Anticipated d/c is to: Home/undetermined. Will get PT evaluation.              Anticipated d/c date is: 2 to 3 days              Patient currently is not medically stable to d/c.  Consultants:  Vascular surgery  Nephrology  Procedures:  Hemodialysis  Antibiotics:  . Rocephin IV 10/19> 10/22  Anti-infectives (From admission, onward)   Start     Dose/Rate Route Frequency Ordered Stop   10/02/20 1300  cefTRIAXone (ROCEPHIN) 1 g in sodium chloride 0.9 % 100 mL IVPB  Status:  Discontinued        1 g 200 mL/hr over 30 Minutes Intravenous Every 24 hours 10/02/20 1204 10/05/20 1104     Subjective: Today, patient was seen and examined during hemodialysis.  Patient states that he feels much better today has less restless leg and was able to rest better yesterday.  Denies shortness of breath cough fever chills or rigor  Objective: Vitals:   10/06/20 0318 10/06/20 0756  BP: 121/67 130/69  Pulse: 69 70  Resp: 18 18  Temp: 98.2 F (36.8 C) 97.6 F (36.4 C)  SpO2: 96% 99%    Intake/Output Summary (Last 24 hours) at 10/06/2020 0801 Last data filed at 10/05/2020 1851 Gross per 24 hour  Intake 480 ml  Output 0 ml  Net 480 ml   Filed Weights   10/03/20 0312 10/04/20 0255 10/06/20 0318  Weight: 118 kg 116.5 kg 115.3 kg   Body mass index is 34.48 kg/m.   Physical Exam:  General: Alert awake and communicative. Hard of hearing. Answering appropriately. Obese HENT:   No scleral pallor or icterus noted. Oral mucosa is moist.  Chest:  Diminished breath  sounds bilaterally. No crackles or wheezes.  CVS: S1 &S2 heard. No murmur.  Regular rate and rhythm. Abdomen: Soft, nontender, nondistended.  Bowel sounds are heard.   Extremities: Right femoral hemodialysis catheter in place. Psych: Awake and communicative. Calm mood.  CNS: No focal neurological deficits. Moving all extremities. Skin: Warm and dry.  No rashes noted.  Data Review: I have personally reviewed the following laboratory data and studies,  CBC: Recent Labs  Lab 10/01/20 0321 10/02/20 0528 10/03/20 0547 10/05/20 0600 10/06/20 0500  WBC 13.6* 9.2 9.4 6.0 5.1  NEUTROABS  --   --   --  4.6  --   HGB 8.4* 7.6* 7.2* 7.0* 6.5*  HCT 26.1* 23.9* 22.1* 21.4* 20.9*  MCV 97.4 96.8 95.3 96.4 97.2  PLT 220 188 170 158 564   Basic Metabolic Panel: Recent Labs  Lab 10/01/20 0316 10/01/20 0321 10/02/20 0528 10/02/20 1315 10/02/20 1746 10/03/20 0547 10/04/20 0530 10/05/20 0600 10/06/20 0500  NA  --    < >  --  140  --  141 141 138 143  K  --    < >  --  4.0  --  3.3* 3.1* 3.1* 3.6  CL  --    < >  --  111  --  107 105 100 106  CO2  --    < >  --  12*  --  18* 21* 26 26  GLUCOSE  --    < >  --  62*  --  103* 117* 152* 167*  BUN  --    < >  --  130*  --  97* 83* 48* 59*  CREATININE  --    < >  --  8.82*  --  7.20* 6.80* 5.05* 6.20*  CALCIUM  --    < >  --  5.8*  --  6.3* 6.5* 7.2* 7.2*  MG 1.6*  --   --   --   --  1.5* 1.6* 1.9 2.0  PHOS 10.4*   < >   < >  --  11.5* 7.9* 8.2* 5.6* 6.3*   < > = values in this interval not displayed.   Liver Function Tests: Recent Labs  Lab 10/02/20 0528 10/03/20 0547  AST 14*  --   ALT 15  --   ALKPHOS 107  --   BILITOT 0.7  --   PROT 5.9*  --   ALBUMIN 3.0* 2.8*   No results for input(s): LIPASE, AMYLASE in the last 168 hours. No results for input(s): AMMONIA in the last 168 hours. Cardiac Enzymes: No results for input(s): CKTOTAL, CKMB, CKMBINDEX, TROPONINI in the last 168 hours. BNP (last 3 results) Recent Labs     03/25/20 0557  BNP 31.0    ProBNP (last 3 results) No results for input(s): PROBNP in the last 8760 hours.  CBG: Recent Labs  Lab 10/05/20 1119 10/05/20 1628 10/05/20 1958 10/06/20 0036 10/06/20 0755  GLUCAP 155* 153* 180* 158* 139*   Recent Results (from the past 240 hour(s))  Respiratory Panel by RT PCR (Flu A&B, Covid) - Nasopharyngeal Swab     Status: None   Collection Time: 09/30/20  9:07 PM   Specimen: Nasopharyngeal Swab  Result Value Ref Range Status   SARS Coronavirus 2 by RT PCR NEGATIVE NEGATIVE Final    Comment: (NOTE) SARS-CoV-2 target nucleic acids are NOT DETECTED.  The SARS-CoV-2 RNA is generally detectable in upper respiratoy specimens during the acute phase of infection. The lowest concentration of SARS-CoV-2 viral copies this assay can detect is 131 copies/mL. A negative result does not preclude SARS-Cov-2 infection and should not be used as the sole basis for treatment or other patient management decisions. A negative result may occur with  improper specimen collection/handling, submission of specimen other than nasopharyngeal swab, presence of viral mutation(s) within the areas targeted by this assay, and inadequate number of viral copies (<131 copies/mL). A negative result must be combined with clinical observations, patient history, and epidemiological information. The expected result is Negative.  Fact Sheet for Patients:  PinkCheek.be  Fact Sheet for Healthcare Providers:  GravelBags.it  This test is no t yet approved or cleared by the [place] FDA and  has been authorized for detection and/or diagnosis of SARS-CoV-2 by FDA under an Emergency Use Authorization (EUA). This EUA will remain  in effect (meaning this test can be used) for the duration of the COVID-19 declaration under Section 564(b)(1) of the Act, 21 U.S.C. section 360bbb-3(b)(1), unless the authorization is terminated  or revoked sooner.     Influenza A by PCR NEGATIVE NEGATIVE Final   Influenza B by PCR NEGATIVE NEGATIVE Final  Comment: (NOTE) The Xpert Xpress SARS-CoV-2/FLU/RSV assay is intended as an aid in  the diagnosis of influenza from Nasopharyngeal swab specimens and  should not be used as a sole basis for treatment. Nasal washings and  aspirates are unacceptable for Xpert Xpress SARS-CoV-2/FLU/RSV  testing.  Fact Sheet for Patients: PinkCheek.be  Fact Sheet for Healthcare Providers: GravelBags.it  This test is not yet approved or cleared by the [place] FDA and  has been authorized for detection and/or diagnosis of SARS-CoV-2 by  FDA under an Emergency Use Authorization (EUA). This EUA will remain  in effect (meaning this test can be used) for the duration of the  Covid-19 declaration under Section 564(b)(1) of the Act, 21  U.S.C. section 360bbb-3(b)(1), unless the authorization is  terminated or revoked. Performed at [facility], [address] [place], [place] [address]   CULTURE, BLOOD (ROUTINE X 2) w Reflex to ID Panel     Status: None (Preliminary result)   Collection Time: 10/02/20  1:16 PM   Specimen: BLOOD  Result Value Ref Range Status   Specimen Description BLOOD LEFT ANTECUBITAL  Final   Special Requests   Final    BOTTLES DRAWN AEROBIC AND ANAEROBIC Blood Culture adequate volume   Culture   Final    NO GROWTH 4 DAYS Performed at [facility], [address], [place], [place] [address]    Report Status PENDING  Incomplete  CULTURE, BLOOD (ROUTINE X 2) w Reflex to ID Panel     Status: None (Preliminary result)   Collection Time: 10/02/20  1:33 PM   Specimen: BLOOD  Result Value Ref Range Status   Specimen Description BLOOD BLOOD LEFT HAND  Final   Special Requests   Final    BOTTLES DRAWN AEROBIC AND ANAEROBIC Blood Culture adequate volume   Culture   Final    NO GROWTH 4  DAYS Performed at [facility], [address], [place],  [address]    Report Status PENDING  Incomplete     Studies: No results found.    Flora Lipps, MD  [facility] 10/06/2020

## 2020-10-06 NOTE — Progress Notes (Addendum)
[name]  MRN: [id number]  DOB/AGE: March 10, 1946 74 y.o.  Primary Care Physician:Babaoff, Beverely Low, MD  Admit date: 09/30/2020  Chief Complaint:  Chief Complaint  Patient presents with  . Weakness  . abnormal labs    S-Pt presented on  09/30/2020 with  Chief Complaint  Patient presents with  . Weakness  . abnormal labs  . Patient main comment in today visit was" Dr. Holley Raring is my friend with both [employer].  Patient next comment was I thought He would see me" patient had no other new physical complaints.  I then discussed patient seen related issues and educated about the need for dialysis   . apixaban  2.5 mg Oral BID  . carvedilol  6.25 mg Oral BID WC  . Chlorhexidine Gluconate Cloth  6 each Topical Q0600  . feeding supplement (NEPRO CARB STEADY)  237 mL Oral BID BM  . gabapentin  100 mg Oral QHS  . guaiFENesin  5 mL Oral TID  . lipase/protease/amylase  72,000 Units Oral TID WC  . mirtazapine  15 mg Oral QHS  . pantoprazole  40 mg Oral BID  . rOPINIRole  2 mg Oral BID  . sodium chloride flush  3 mL Intravenous Q12H  . sucralfate  1 g Oral TID  . thiamine injection  100 mg Intravenous Daily         HGD:JMEQA from the symptoms mentioned above,there are no other symptoms referable to all systems reviewed.  Physical Exam: Vital signs in last 24 hours: Temp:  [97.6 F (36.4 C)-98.4 F (36.9 C)] 97.6 F (36.4 C) (10/23 0756) Pulse Rate:  [69-73] 70 (10/23 0756) Resp:  [16-18] 18 (10/23 0756) BP: (103-133)/(58-69) 130/69 (10/23 0756) SpO2:  [96 %-100 %] 99 % (10/23 0756) Weight:  [115.3 kg] 115.3 kg (10/23 0318) Weight change:  Last BM Date: 10/04/20  Intake/Output from previous day: 10/22 0701 - 10/23 0700 In: 480 [P.O.:480] Out: 0  No intake/output data recorded.   Physical Exam:  General- pt is awake,alert, follows commands  Resp- No acute REsp distress, CTA B/L NO Rhonchi  CVS- S1S2 regular in rate and rhythm  GIT- BS+, soft,  Non tender , Non distended  EXT- NO LE Edema, Cyanosis  Access- temporary cath   Lab Results:  CBC  Recent Labs    10/05/20 0600 10/06/20 0500  WBC 6.0 5.1  HGB 7.0* 6.5*  HCT 21.4* 20.9*  PLT 158 169    BMET  Recent Labs    10/05/20 0600 10/06/20 0500  NA 138 143  K 3.1* 3.6  CL 100 106  CO2 26 26  GLUCOSE 152* 167*  BUN 48* 59*  CREATININE 5.05* 6.20*  CALCIUM 7.2* 7.2*      Most recent Creatinine trend  Lab Results  Component Value Date   CREATININE 6.20 (H) 10/06/2020   CREATININE 5.05 (H) 10/05/2020   CREATININE 6.80 (H) 10/04/2020      MICRO   Recent Results (from the past 240 hour(s))  Respiratory Panel by RT PCR (Flu A&B, Covid) - Nasopharyngeal Swab     Status: None   Collection Time: 09/30/20  9:07 PM   Specimen: Nasopharyngeal Swab  Result Value Ref Range Status   SARS Coronavirus 2 by RT PCR NEGATIVE NEGATIVE Final    Comment: (NOTE) SARS-CoV-2 target nucleic acids are NOT DETECTED.  The SARS-CoV-2 RNA is generally detectable in upper respiratoy specimens during the acute phase of infection. The lowest concentration of SARS-CoV-2 viral copies this  assay can detect is 131 copies/mL. A negative result does not preclude SARS-Cov-2 infection and should not be used as the sole basis for treatment or other patient management decisions. A negative result may occur with  improper specimen collection/handling, submission of specimen other than nasopharyngeal swab, presence of viral mutation(s) within the areas targeted by this assay, and inadequate number of viral copies (<131 copies/mL). A negative result must be combined with clinical observations, patient history, and epidemiological information. The expected result is Negative.  Fact Sheet for Patients:  PinkCheek.be  Fact Sheet for Healthcare Providers:  GravelBags.it  This test is no t yet approved or cleared by the [place] FDA and  has been authorized for detection and/or diagnosis of SARS-CoV-2 by FDA under an Emergency Use Authorization (EUA). This EUA will remain  in effect (meaning this test can be used) for the duration of the COVID-19 declaration under Section 564(b)(1) of the Act, 21 U.S.C. section 360bbb-3(b)(1), unless the authorization is terminated or revoked sooner.     Influenza A by PCR NEGATIVE NEGATIVE Final   Influenza B by PCR NEGATIVE NEGATIVE Final    Comment: (NOTE) The Xpert Xpress SARS-CoV-2/FLU/RSV assay is intended as an aid in  the diagnosis of influenza from Nasopharyngeal swab specimens and  should not be used as a sole basis for treatment. Nasal washings and  aspirates are unacceptable for Xpert Xpress SARS-CoV-2/FLU/RSV  testing.  Fact Sheet for Patients: PinkCheek.be  Fact Sheet for Healthcare Providers: GravelBags.it  This test is not yet approved or cleared by the [place] FDA and  has been authorized for detection and/or diagnosis of SARS-CoV-2 by  FDA under an Emergency Use Authorization (EUA). This EUA will remain  in effect (meaning this test can be used) for the duration of the  Covid-19 declaration under Section 564(b)(1) of the Act, 21  U.S.C. section 360bbb-3(b)(1), unless the authorization is  terminated or revoked. Performed at [facility], [address] [place], [place] [address]   CULTURE, BLOOD (ROUTINE X 2) w Reflex to ID Panel     Status: None (Preliminary result)   Collection Time: 10/02/20  1:16 PM   Specimen: BLOOD  Result Value Ref Range Status   Specimen Description BLOOD LEFT ANTECUBITAL  Final   Special Requests   Final    BOTTLES DRAWN AEROBIC AND ANAEROBIC Blood Culture adequate volume   Culture   Final    NO GROWTH 4 DAYS Performed at [facility], [address], [place], [place] [address]    Report Status PENDING  Incomplete  CULTURE, BLOOD  (ROUTINE X 2) w Reflex to ID Panel     Status: None (Preliminary result)   Collection Time: 10/02/20  1:33 PM   Specimen: BLOOD  Result Value Ref Range Status   Specimen Description BLOOD BLOOD LEFT HAND  Final   Special Requests   Final    BOTTLES DRAWN AEROBIC AND ANAEROBIC Blood Culture adequate volume   Culture   Final    NO GROWTH 4 DAYS Performed at [facility], [address], [place], [place] [address]    Report Status PENDING  Incomplete         Impression:   1)Renal    AKI secondary to ATN CKD stage 3B. CKD since 2013 CKD secondary to multiple factors Patient does have a history of renal cell carcinoma and is status post nephrectomy-low glomerular  mass Diabetes mellitus Hypertension Progression of CKD marked with acute kidney injury  Patient presented with a creatinine of 6.7 and was initiated on renal placement therapy on October 19. Patient did receive dialysis on October 19 20th and 21st. Patient creatinine improved from the peak of 8.8 on October 19 to 5.5 on October 22. Patient creatinine is now high again at 6.2. Patient will benefit from renal replacement therapy today   2)HTN    Blood pressure is stable    3)Anemia of chronic disease  CBC Latest Ref Rng & Units 10/06/2020 10/05/2020 10/03/2020  WBC 4.0 - 10.5 K/uL 5.1 6.0 9.4  Hemoglobin 13.0 - 17.0 g/dL 6.5(L) 7.0(L) 7.2(L)  Hematocrit 39 - 52 % 20.9(L) 21.4(L) 22.1(L)  Platelets 150 - 400 K/uL 169 158 170    Patient will need PRBC as hemoglobin less than 7   HGb is not at goal (9--11)   4) Secondary hyperparathyroidism -CKD Mineral-Bone Disorder    Lab Results  Component Value Date   CALCIUM 7.2 (L) 10/06/2020   PHOS 6.3 (H) 10/06/2020    Secondary Hyperparathyroidism present  Phosphorus is not at goal.   5) diabetes mellitus Being closely followed by the primary team   6) Electrolytes   BMP Latest Ref Rng & Units 10/06/2020 10/05/2020 10/04/2020  Glucose  70 - 99 mg/dL 167(H) 152(H) 117(H)  BUN 8 - 23 mg/dL 59(H) 48(H) 83(H)  Creatinine 0.61 - 1.24 mg/dL 6.20(H) 5.05(H) 6.80(H)  Sodium 135 - 145 mmol/L 143 138 141  Potassium 3.5 - 5.1 mmol/L 3.6 3.1(L) 3.1(L)  Chloride 98 - 111 mmol/L 106 100 105  CO2 22 - 32 mmol/L 26 26 21(L)  Calcium 8.9 - 10.3 mg/dL 7.2(L) 7.2(L) 6.5(L)     Sodium Normonatremic   Potassium Normokalemic Patient was hyperkalemic earlier   7)Acid base  Patient had acidosis earlier Patient bicarb is now better after stent placement therapy  Co2 at goal     Plan:  We will dialyze today   Addendum Pt seen on Dialysis     [name] s [facility] 10/06/2020, 8:43 AM

## 2020-10-06 NOTE — Progress Notes (Signed)
Patient assessment completed with patient stating no c/o any when asked.Patient alert and oriented x 3. No s/s of any respiratory distress noted. V/S WNL during assessment.Treatment to be started per PCT. Will continue to monitor.

## 2020-10-06 NOTE — Plan of Care (Signed)
Type and screen completed and Lab indicated Antibodies notes.  Currently looking for match but will probably take several hours. (Per blood bank).

## 2020-10-06 NOTE — Progress Notes (Signed)
PT Cancellation Note  Patient Details Name: [name] MRN: [id number] DOB: 1946/09/07   Cancelled Treatment:    Reason Eval/Treat Not Completed: Patient at procedure or test/unavailable Pt gone to dialysis treatment. PT will check back as able  Durwin Reges DPT Durwin Reges 10/06/2020, 1:47 PM

## 2020-10-07 ENCOUNTER — Inpatient Hospital Stay: Payer: Medicare Other

## 2020-10-07 DIAGNOSIS — N1832 Chronic kidney disease, stage 3b: Secondary | ICD-10-CM | POA: Diagnosis not present

## 2020-10-07 DIAGNOSIS — K529 Noninfective gastroenteritis and colitis, unspecified: Secondary | ICD-10-CM | POA: Diagnosis not present

## 2020-10-07 DIAGNOSIS — E1122 Type 2 diabetes mellitus with diabetic chronic kidney disease: Secondary | ICD-10-CM | POA: Diagnosis not present

## 2020-10-07 DIAGNOSIS — N179 Acute kidney failure, unspecified: Secondary | ICD-10-CM | POA: Diagnosis not present

## 2020-10-07 LAB — FERRITIN: Ferritin: 179 ng/mL (ref 24–336)

## 2020-10-07 LAB — GLUCOSE, CAPILLARY
Glucose-Capillary: 121 mg/dL — ABNORMAL HIGH (ref 70–99)
Glucose-Capillary: 136 mg/dL — ABNORMAL HIGH (ref 70–99)
Glucose-Capillary: 163 mg/dL — ABNORMAL HIGH (ref 70–99)
Glucose-Capillary: 166 mg/dL — ABNORMAL HIGH (ref 70–99)
Glucose-Capillary: 202 mg/dL — ABNORMAL HIGH (ref 70–99)
Glucose-Capillary: 78 mg/dL (ref 70–99)

## 2020-10-07 LAB — MAGNESIUM: Magnesium: 2 mg/dL (ref 1.7–2.4)

## 2020-10-07 LAB — CULTURE, BLOOD (ROUTINE X 2)
Culture: NO GROWTH
Culture: NO GROWTH
Special Requests: ADEQUATE
Special Requests: ADEQUATE

## 2020-10-07 LAB — CBC
HCT: 24.5 % — ABNORMAL LOW (ref 39.0–52.0)
Hemoglobin: 7.8 g/dL — ABNORMAL LOW (ref 13.0–17.0)
MCH: 30.4 pg (ref 26.0–34.0)
MCHC: 31.8 g/dL (ref 30.0–36.0)
MCV: 95.3 fL (ref 80.0–100.0)
Platelets: 162 10*3/uL (ref 150–400)
RBC: 2.57 MIL/uL — ABNORMAL LOW (ref 4.22–5.81)
RDW: 15.5 % (ref 11.5–15.5)
WBC: 5.6 10*3/uL (ref 4.0–10.5)
nRBC: 0 % (ref 0.0–0.2)

## 2020-10-07 LAB — BASIC METABOLIC PANEL
Anion gap: 9 (ref 5–15)
BUN: 41 mg/dL — ABNORMAL HIGH (ref 8–23)
CO2: 27 mmol/L (ref 22–32)
Calcium: 8 mg/dL — ABNORMAL LOW (ref 8.9–10.3)
Chloride: 105 mmol/L (ref 98–111)
Creatinine, Ser: 4.7 mg/dL — ABNORMAL HIGH (ref 0.61–1.24)
GFR, Estimated: 13 mL/min — ABNORMAL LOW (ref >60)
Glucose, Bld: 144 mg/dL — ABNORMAL HIGH (ref 70–99)
Potassium: 3.5 mmol/L (ref 3.5–5.1)
Sodium: 141 mmol/L (ref 135–145)

## 2020-10-07 LAB — PHOSPHORUS: Phosphorus: 5 mg/dL — ABNORMAL HIGH (ref 2.5–4.6)

## 2020-10-07 MED ORDER — THIAMINE HCL 100 MG PO TABS
100.0000 mg | ORAL_TABLET | Freq: Every day | ORAL | Status: DC
  Administered 2020-10-07 – 2020-10-12 (×5): 100 mg via ORAL
  Filled 2020-10-07 (×5): qty 1

## 2020-10-07 MED ORDER — MIRTAZAPINE 15 MG PO TABS
30.0000 mg | ORAL_TABLET | Freq: Every day | ORAL | Status: DC
  Administered 2020-10-07 – 2020-10-11 (×5): 30 mg via ORAL
  Filled 2020-10-07 (×5): qty 2

## 2020-10-07 MED ORDER — ROPINIROLE HCL 1 MG PO TABS
4.0000 mg | ORAL_TABLET | Freq: Two times a day (BID) | ORAL | Status: DC
  Administered 2020-10-07 – 2020-10-12 (×10): 4 mg via ORAL
  Filled 2020-10-07 (×10): qty 4

## 2020-10-07 MED ORDER — OXYMETAZOLINE HCL 0.05 % NA SOLN
1.0000 | Freq: Two times a day (BID) | NASAL | Status: AC
  Administered 2020-10-07 – 2020-10-09 (×6): 1 via NASAL
  Filled 2020-10-07: qty 15

## 2020-10-07 NOTE — Evaluation (Signed)
Physical Therapy Evaluation Patient Details Name: [name] MRN: [id number] DOB: 1946-09-30 Today's Date: 10/07/2020   History of Present Illness  74 yo male with onset of LE weakness and edema on LE's was admitted, found to have hypoglycemia, AKI, metabolic acidosis, pancreatic insufficiency, and is on HD.  New Medic-alert bracelet due to frequent falls.  PMHx:  DM, GERD, HTN, DVT, COPD, renal cell CA, OSA, CKD3a, gastric bypass, back pain, L nephrectomy 2004,   Clinical Impression  Pt is up to side of bed with mod assist, up to chair with mod assist.  He is unaware of how to use good body mechanics despite his lengthy course of outpatient PT.  Follow up with him to work on strengthening his trunk to stand more upright, work on upright posture and reinforce back protection rules.  He is motivated to go to rehab, and will anticipate this being essential to return home, given mult recent falls.  Follow goals of PT as written to prepare to go to SNF.    Follow Up Recommendations SNF    Equipment Recommendations  Rolling walker with 5" wheels    Recommendations for Other Services       Precautions / Restrictions Precautions Precautions: Fall Precaution Comments: back pain complaints Restrictions Weight Bearing Restrictions: No      Mobility  Bed Mobility Overal bed mobility: Needs Assistance Bed Mobility: Supine to Sit     Supine to sit: Mod assist     General bed mobility comments: mod to roll and scoot legs to side of bed, then lifting trunk laterally    Transfers Overall transfer level: Needs assistance Equipment used: Rolling walker (2 wheeled);1 person hand held assist Transfers: Sit to/from [facility] Sit to Stand: Mod assist Stand pivot transfers: Mod assist       General transfer comment: mod to power up and to steady to sidestep to the chair  Ambulation/Gait                Stairs            Wheelchair Mobility     Modified Rankin (Stroke Patients Only)       Balance Overall balance assessment: History of Falls;Needs assistance Sitting-balance support: Feet supported Sitting balance-Leahy Scale: Fair     Standing balance support: Bilateral upper extremity supported;During functional activity Standing balance-Leahy Scale: Poor                               Pertinent Vitals/Pain Pain Assessment: Faces Faces Pain Scale: Hurts little more Pain Location: back and hips Pain Descriptors / Indicators: Guarding;Grimacing Pain Intervention(s): Limited activity within patient's tolerance;Monitored during session;Repositioned    Home Living Family/patient expects to be discharged to:: Private residence Living Arrangements: Spouse/significant other Available Help at Discharge: Family Type of Home: House Home Access: Ramped entrance;Stairs to enter Entrance Stairs-Rails: Right;Left Entrance Stairs-Number of Steps: ramp to enter from garage Home Layout: One level Home Equipment: Environmental consultant - 2 wheels;Cane - single point;Shower seat Additional Comments: wife is home with pt and reports many falls    Prior Function Level of Independence: Independent with assistive device(s);Needs assistance   Gait / Transfers Assistance Needed: SPC but having falls  ADL's / Homemaking Assistance Needed: wife is helping to care for the house        Hand Dominance   Dominant Hand: Left    Extremity/Trunk Assessment   Upper Extremity Assessment Upper Extremity  Assessment: Overall WFL for tasks assessed    Lower Extremity Assessment Lower Extremity Assessment: Overall WFL for tasks assessed    Cervical / Trunk Assessment Cervical / Trunk Assessment: Kyphotic  Communication   Communication: No difficulties  Cognition Arousal/Alertness: Awake/alert Behavior During Therapy: WFL for tasks assessed/performed Overall Cognitive Status: Within Functional Limits for tasks assessed                                  General Comments: requires cues for sequencing but is aware of how to stand      General Comments General comments (skin integrity, edema, etc.): pt is up to side of bed and with help for lines got him to the chair with back support in place.  Talked with pt and wife about askiing for rehab stay    Exercises     Assessment/Plan    PT Assessment Patient needs continued PT services  PT Problem List Decreased strength;Decreased range of motion;Decreased activity tolerance;Decreased balance;Decreased mobility;Decreased coordination;Decreased knowledge of use of DME;Decreased safety awareness;Obesity;Decreased skin integrity;Pain       PT Treatment Interventions DME instruction;Gait training;Stair training;Functional mobility training;Therapeutic activities;Therapeutic exercise;Balance training;Neuromuscular re-education;Patient/family education    PT Goals (Current goals can be found in the Care Plan section)  Acute Rehab PT Goals Patient Stated Goal: to walk and get home PT Goal Formulation: With patient/family Time For Goal Achievement: 10/21/20 Potential to Achieve Goals: Good    Frequency Min 2X/week   Barriers to discharge Inaccessible home environment;Decreased caregiver support requiring two person help for safety with gait    Co-evaluation               AM-PAC PT "6 Clicks" Mobility  Outcome Measure Help needed turning from your back to your side while in a flat bed without using bedrails?: A Little Help needed moving from lying on your back to sitting on the side of a flat bed without using bedrails?: A Lot Help needed moving to and from a bed to a chair (including a wheelchair)?: A Lot Help needed standing up from a chair using your arms (e.g., wheelchair or bedside chair)?: A Lot Help needed to walk in hospital room?: A Lot Help needed climbing 3-5 steps with a railing? : Total 6 Click Score: 12    End of Session Equipment  Utilized During Treatment: Gait belt;Oxygen Activity Tolerance: Patient limited by fatigue;Treatment limited secondary to medical complications (Comment) Patient left: in chair;with call bell/phone within reach;with chair alarm set;with family/visitor present Nurse Communication: Mobility status;Precautions PT Visit Diagnosis: Unsteadiness on feet (R26.81);Repeated falls (R29.6);Ataxic gait (R26.0);Difficulty in walking, not elsewhere classified (R26.2)    Time: 1436-1510 PT Time Calculation (min) (ACUTE ONLY): 34 min   Charges:   PT Evaluation $PT Eval Moderate Complexity: 1 Mod PT Treatments $Therapeutic Activity: 8-22 mins       Ramond Dial 10/07/2020, 7:19 PM  Mee Hives, PT MS Acute Rehab Dept. Number: [place] [facility] [place]

## 2020-10-07 NOTE — Progress Notes (Addendum)
PROGRESS NOTE  [name]:[id number] DOB: 09-30-1946 DOA: 09/30/2020 PCP: Derinda Late, MD   LOS: 7 days   Brief narrative: As per HPI,  [name] is a 74 y.o. 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  He was referred to the emergency room by his PCP because of lower extremity weakness and acute kidney injury. He was found to have acute kidney injury complicated by hyperkalemia, metabolic acidosis, uremic encephalopathy, myoclonic jerks, hyperphosphatemia and hypocalcemia.  He was treated with IV sodium bicarbonate infusion.  Nephrologist was consulted to assist with management.  Patient did have worsening kidney function with fluid overload and hypoxic respiratory failure.  Vascular surgery was consulted for a temporary dialysis catheter placement.  Patient underwent hemodialysis with improvement in his metabolic encephalopathy.     Assessment/Plan:  Principal Problem:   AKI (acute kidney injury) ([place]) Active Problems:   Essential hypertension   Type II diabetes mellitus with renal manifestations (HCC)   Anemia in chronic kidney disease   Spinal stenosis, lumbar region, with neurogenic claudication   Hyperkalemia   COPD (chronic obstructive pulmonary disease) ([place])   CKD stage 3 due to type 2 diabetes mellitus ([place])   History of renal cell carcinoma   History of left nephrectomy 2004   Hypoglycemia   Pancreatic insufficiency   Chronic diarrhea   Metabolic acidosis  AKI on CKD stage IIIb.  Currently receiving the intermittent hemodialysis for uremic symptoms, severe metabolic acidosis hyperkalemia and uremia which was present on admission.  His symptoms have  improved.  Status post temporary hemodialysis catheter placement by vascular surgery.  Nephrology is following.  Follow nephrology opinion on further hemodialysis and input.  Acute metabolic encephalopathy/uremic encephalopathy: Improving.  Continue hemodialysis. . Initially medications were on hold.  Now resumed low-dose gabapentin, Cymbalta is still on hold.  Continue Klonopin, low-dose Remeron.  Will increase Remeron to 30 mg at nighttime and increase Requip to 4 mg twice a day..  Receiving thiamine IV daily.  Will change to p.o. thiamine.  Fever:  Was initially on Rocephin which has been discontinued.  No obvious source has been localized.  Fever has resolved at this time.  No leukocytosis.     Acute hypoxemic respiratory failure, currently on nasal cannula.  On 5 L of nasal cannula oxygen.  .  Encourage incentive spirometry.  We will try to wean oxygen as able.  We will obtain a chest x-ray for evaluation today.  Patient was encouraged on incentive spirometry and deep breathing.  Hyperphosphatemia, hypocalcemia, mild hypomagnesemia: Potassium of 3.5 today.  Received hemodialysis yesterday  Essential hypertension: Continue carvedilol.  Stable blood pressure at this time.  History of DVT and PE: Continue Eliquis renal dosing.  Would benefit from same dosing on discharge.  Type II DM with recurrent hypoglycemia:  Off glipizide.  Initially received IV dextrose.  Has been restarted on oral diet.    Latest POC glucose of 121.  Could consider sliding scale insulin if rising hemoglobin levels.  Anemia of chronic kidney disease:   Continue to monitor.  Latest hemoglobin of 6.5..  EPO/iron as per nephrology.  Serum iron of 44, TIBC 183.  Ferritin of 182.  Iron saturation at 24%.  Patient would benefit from IV iron as per nephrology.  He might benefit from from 1 unit of packed RBC today.  Blood consent obtained.  Chronic pancreatitis/chronic diarrhea: Continue Creon   COPD: As needed  bronchodilators.  compensated.   Chronic back pain: Hold off with narcotics for now.  Resume as necessary.  Patient has not complained of any undue pain at this time.  Depression: Increase Remeron to 30 mg at nighttime.  Cymbalta still on hold.   Debility, weakness.  Will get PT evaluation.  DVT prophylaxis: apixaban (ELIQUIS) tablet 2.5 mg Start: 10/02/20 1000 apixaban (ELIQUIS) tablet 2.5 mg     Code Status: Full code  Family Communication: Spoke with the patient's wife at bedside and answered all the queries.  Status is: Inpatient  Remains inpatient appropriate because:Persistent severe electrolyte disturbances, Unsafe d/c plan, IV treatments appropriate due to intensity of illness or inability to take PO, Inpatient level of care appropriate due to severity of illness and Need for hemodialysis, ongoing uremic encephalopathy,   Dispo: The patient is from: Home              Anticipated d/c is to: Home/undetermined.  Physical therapy evaluation pending.              Anticipated d/c date is: 2 to 3 days              Patient currently is not medically stable to d/c.  Consultants:  Vascular surgery  Nephrology  Procedures:  Hemodialysis  Antibiotics:  . Rocephin IV 10/19> 10/22  Anti-infectives (From admission, onward)   Start     Dose/Rate Route Frequency Ordered Stop   10/02/20 1300  cefTRIAXone (ROCEPHIN) 1 g in sodium chloride 0.9 % 100 mL IVPB  Status:  Discontinued        1 g 200 mL/hr over 30 Minutes Intravenous Every 24 hours 10/02/20 1204 10/05/20 1104     Subjective: Today, patient was seen and examined at bedside.  Patient stated that he could not sleep at all yesterday because of medication which was given later in the evening.  He wishes his Requip to be given early today.  Denies any chest pain, shortness of breath, fever, chills or rigor.  Objective: Vitals:   10/07/20 0315 10/07/20 0725  BP: 134/69 127/80  Pulse: 70 64  Resp: 20 17  Temp: 98.3 F  (36.8 C) 98.2 F (36.8 C)  SpO2: 100% 100%    Intake/Output Summary (Last 24 hours) at 10/07/2020 0824 Last data filed at 10/07/2020 0328 Gross per 24 hour  Intake 1700 ml  Output 2221 ml  Net -521 ml   Filed Weights   10/04/20 0255 10/06/20 0318 10/07/20 0315  Weight: 116.5 kg 115.3 kg 111.9 kg   Body mass index is 33.47 kg/m.   Physical Exam:  General:  Obese  built, not in obvious distress, hard of hearing, alert awake and communicative, obese HENT:   No scleral pallor or icterus noted. Oral mucosa is moist.  Chest:   Diminished breath sounds bilaterally. No crackles or wheezes.  CVS: S1 &S2 heard. No murmur.  Regular rate and rhythm. Abdomen: Soft, nontender, nondistended.  Bowel sounds are heard.   Extremities: No cyanosis, clubbing or edema.  Peripheral pulses are palpable.  Right femoral hemodialysis catheter in place Psych: Alert, awake and communicative. CNS:  No cranial nerve deficits.  Power equal in all extremities.   Skin: Warm and dry.  No rashes noted.   Data Review: I have personally reviewed the following laboratory data and studies,  CBC:  Recent Labs  Lab 10/02/20 0528 10/02/20 0528 10/03/20 0547 10/05/20 0600 10/06/20 0500 10/06/20 2135 10/07/20 0529  WBC 9.2  --  9.4 6.0 5.1  --  5.6  NEUTROABS  --   --   --  4.6  --   --   --   HGB 7.6*   < > 7.2* 7.0* 6.5* 8.0* 7.8*  HCT 23.9*   < > 22.1* 21.4* 20.9* 24.7* 24.5*  MCV 96.8  --  95.3 96.4 97.2  --  95.3  PLT 188  --  170 158 169  --  162   < > = values in this interval not displayed.   Basic Metabolic Panel: Recent Labs  Lab 10/03/20 0547 10/04/20 0530 10/05/20 0600 10/06/20 0500 10/07/20 0529  NA 141 141 138 143 141  K 3.3* 3.1* 3.1* 3.6 3.5  CL 107 105 100 106 105  CO2 18* 21* 26 26 27   GLUCOSE 103* 117* 152* 167* 144*  BUN 97* 83* 48* 59* 41*  CREATININE 7.20* 6.80* 5.05* 6.20* 4.70*  CALCIUM 6.3* 6.5* 7.2* 7.2* 8.0*  MG 1.5* 1.6* 1.9 2.0 2.0  PHOS 7.9* 8.2* 5.6* 6.3* 5.0*     Liver Function Tests: Recent Labs  Lab 10/02/20 0528 10/03/20 0547  AST 14*  --   ALT 15  --   ALKPHOS 107  --   BILITOT 0.7  --   PROT 5.9*  --   ALBUMIN 3.0* 2.8*   No results for input(s): LIPASE, AMYLASE in the last 168 hours. No results for input(s): AMMONIA in the last 168 hours. Cardiac Enzymes: No results for input(s): CKTOTAL, CKMB, CKMBINDEX, TROPONINI in the last 168 hours. BNP (last 3 results) Recent Labs    03/25/20 0557  BNP 31.0    ProBNP (last 3 results) No results for input(s): PROBNP in the last 8760 hours.  CBG: Recent Labs  Lab 10/06/20 1632 10/06/20 1935 10/07/20 0024 10/07/20 0432 10/07/20 0726  GLUCAP 134* 147* 136* 78 121*   Recent Results (from the past 240 hour(s))  Respiratory Panel by RT PCR (Flu A&B, Covid) - Nasopharyngeal Swab     Status: None   Collection Time: 09/30/20  9:07 PM   Specimen: Nasopharyngeal Swab  Result Value Ref Range Status   SARS Coronavirus 2 by RT PCR NEGATIVE NEGATIVE Final    Comment: (NOTE) SARS-CoV-2 target nucleic acids are NOT DETECTED.  The SARS-CoV-2 RNA is generally detectable in upper respiratoy specimens during the acute phase of infection. The lowest concentration of SARS-CoV-2 viral copies this assay can detect is 131 copies/mL. A negative result does not preclude SARS-Cov-2 infection and should not be used as the sole basis for treatment or other patient management decisions. A negative result may occur with  improper specimen collection/handling, submission of specimen other than nasopharyngeal swab, presence of viral mutation(s) within the areas targeted by this assay, and inadequate number of viral copies (<131 copies/mL). A negative result must be combined with clinical observations, patient history, and epidemiological information. The expected result is Negative.  Fact Sheet for Patients:  PinkCheek.be  Fact Sheet for Healthcare Providers:   GravelBags.it  This test is no t yet approved or cleared by the [place] FDA and  has been authorized for detection and/or diagnosis of SARS-CoV-2 by FDA under an Emergency Use Authorization (EUA). This EUA will remain  in effect (meaning this test can be used) for the duration of the COVID-19 declaration under Section 564(b)(1) of the Act, 21 U.S.C.  section 360bbb-3(b)(1), unless the authorization is terminated or revoked sooner.     Influenza A by PCR NEGATIVE NEGATIVE Final   Influenza B by PCR NEGATIVE NEGATIVE Final    Comment: (NOTE) The Xpert Xpress SARS-CoV-2/FLU/RSV assay is intended as an aid in  the diagnosis of influenza from Nasopharyngeal swab specimens and  should not be used as a sole basis for treatment. Nasal washings and  aspirates are unacceptable for Xpert Xpress SARS-CoV-2/FLU/RSV  testing.  Fact Sheet for Patients: PinkCheek.be  Fact Sheet for Healthcare Providers: GravelBags.it  This test is not yet approved or cleared by the [place] FDA and  has been authorized for detection and/or diagnosis of SARS-CoV-2 by  FDA under an Emergency Use Authorization (EUA). This EUA will remain  in effect (meaning this test can be used) for the duration of the  Covid-19 declaration under Section 564(b)(1) of the Act, 21  U.S.C. section 360bbb-3(b)(1), unless the authorization is  terminated or revoked. Performed at [facility], [address] [place], [place] [address]   CULTURE, BLOOD (ROUTINE X 2) w Reflex to ID Panel     Status: None   Collection Time: 10/02/20  1:16 PM   Specimen: BLOOD  Result Value Ref Range Status   Specimen Description BLOOD LEFT ANTECUBITAL  Final   Special Requests   Final    BOTTLES DRAWN AEROBIC AND ANAEROBIC Blood Culture adequate volume   Culture   Final    NO GROWTH 5 DAYS Performed at [facility], [address] [place], [place] [address]    Report Status 10/07/2020 FINAL  Final  CULTURE, BLOOD (ROUTINE X 2) w Reflex to ID Panel     Status: None   Collection Time: 10/02/20  1:33 PM   Specimen: BLOOD  Result Value Ref Range Status   Specimen Description BLOOD BLOOD LEFT HAND  Final   Special Requests   Final    BOTTLES DRAWN AEROBIC AND ANAEROBIC Blood Culture adequate volume   Culture   Final    NO GROWTH 5 DAYS Performed at [facility], [address], [place], [place] [address]    Report Status 10/07/2020 FINAL  Final     Studies: No results found.    Flora Lipps, MD  [facility] 10/07/2020

## 2020-10-07 NOTE — Progress Notes (Signed)
[name]  MRN: [id number]  DOB/AGE: Sep 05, 1946 74 y.o.  Primary Care Physician:Babaoff, Beverely Low, MD  Admit date: 09/30/2020  Chief Complaint:  Chief Complaint  Patient presents with  . Weakness  . abnormal labs    S-Pt presented on  09/30/2020 with  Chief Complaint  Patient presents with  . Weakness  . abnormal labs  . Patient had multiple comments in today's visit.  Patient initially started the conversation again by talking about how he misses Dr. Zollie Scale.  Patient spouse was present in the room in today's discussion. Patient did ask me relevant question about dialysis.  I then had extensive discussion the patient regarding different modalities of dialysis.  I discussed hemodialysis/home-based dialysis regimen suggest peritoneal dialysis at length.I discussed temporary catheter/tunnel catheter/AV fistula at length. I discussed different travel arrangements-including the patient being able to drive himself. I answered patient and his spouse queries to the best of my ability    Medications  . apixaban  2.5 mg Oral BID  . carvedilol  6.25 mg Oral BID WC  . Chlorhexidine Gluconate Cloth  6 each Topical Q0600  . feeding supplement (NEPRO CARB STEADY)  237 mL Oral BID BM  . gabapentin  100 mg Oral QHS  . guaiFENesin  5 mL Oral TID  . lipase/protease/amylase  72,000 Units Oral TID WC  . mirtazapine  30 mg Oral QHS  . oxymetazoline  1 spray Each Nare BID  . pantoprazole  40 mg Oral BID  . rOPINIRole  4 mg Oral BID AC  . sodium chloride flush  3 mL Intravenous Q12H  . sucralfate  1 g Oral TID  . thiamine  100 mg Oral Daily         OIN:OMVEH from the symptoms mentioned above,there are no other symptoms referable to all systems reviewed.  Physical Exam: Vital signs in last 24 hours: Temp:  [98.2 F (36.8 C)-100 F (37.8 C)] 98.4 F (36.9 C) (10/24 1300) Pulse Rate:  [64-82] 70 (10/24 1300) Resp:  [16-20] 16 (10/24 1300) BP: (116-136)/(57-82) 131/77  (10/24 1300) SpO2:  [95 %-100 %] 95 % (10/24 1300) Weight:  [111.9 kg] 111.9 kg (10/24 0315) Weight change: -3.357 kg Last BM Date: 10/04/20  Intake/Output from previous day: 10/23 0701 - 10/24 0700 In: 1700 [P.O.:480; I.V.:500; Blood:720] Out: 2221 [Urine:1750] Total I/O In: 720 [P.O.:720] Out: 650 [Urine:650]   Physical Exam:  General- pt is awake,alert, follows commands  Resp- No acute REsp distress, CTA B/L NO Rhonchi  CVS- S1S2 regular in rate and rhythm  GIT- BS+, soft, Non tender , Non distended  EXT- NO LE Edema, Cyanosis  Access- temporary cath   Lab Results:  CBC  Recent Labs    10/06/20 0500 10/06/20 0500 10/06/20 2135 10/07/20 0529  WBC 5.1  --   --  5.6  HGB 6.5*   < > 8.0* 7.8*  HCT 20.9*   < > 24.7* 24.5*  PLT 169  --   --  162   < > = values in this interval not displayed.    BMET  Recent Labs    10/06/20 0500 10/07/20 0529  NA 143 141  K 3.6 3.5  CL 106 105  CO2 26 27  GLUCOSE 167* 144*  BUN 59* 41*  CREATININE 6.20* 4.70*  CALCIUM 7.2* 8.0*      Most recent Creatinine trend  Lab Results  Component Value Date   CREATININE 4.70 (H) 10/07/2020   CREATININE 6.20 (H) 10/06/2020   CREATININE  5.05 (H) 10/05/2020      MICRO   Recent Results (from the past 240 hour(s))  Respiratory Panel by RT PCR (Flu A&B, Covid) - Nasopharyngeal Swab     Status: None   Collection Time: 09/30/20  9:07 PM   Specimen: Nasopharyngeal Swab  Result Value Ref Range Status   SARS Coronavirus 2 by RT PCR NEGATIVE NEGATIVE Final    Comment: (NOTE) SARS-CoV-2 target nucleic acids are NOT DETECTED.  The SARS-CoV-2 RNA is generally detectable in upper respiratoy specimens during the acute phase of infection. The lowest concentration of SARS-CoV-2 viral copies this assay can detect is 131 copies/mL. A negative result does not preclude SARS-Cov-2 infection and should not be used as the sole basis for treatment or other patient management decisions.  A negative result may occur with  improper specimen collection/handling, submission of specimen other than nasopharyngeal swab, presence of viral mutation(s) within the areas targeted by this assay, and inadequate number of viral copies (<131 copies/mL). A negative result must be combined with clinical observations, patient history, and epidemiological information. The expected result is Negative.  Fact Sheet for Patients:  PinkCheek.be  Fact Sheet for Healthcare Providers:  GravelBags.it  This test is no t yet approved or cleared by the [place] FDA and  has been authorized for detection and/or diagnosis of SARS-CoV-2 by FDA under an Emergency Use Authorization (EUA). This EUA will remain  in effect (meaning this test can be used) for the duration of the COVID-19 declaration under Section 564(b)(1) of the Act, 21 U.S.C. section 360bbb-3(b)(1), unless the authorization is terminated or revoked sooner.     Influenza A by PCR NEGATIVE NEGATIVE Final   Influenza B by PCR NEGATIVE NEGATIVE Final    Comment: (NOTE) The Xpert Xpress SARS-CoV-2/FLU/RSV assay is intended as an aid in  the diagnosis of influenza from Nasopharyngeal swab specimens and  should not be used as a sole basis for treatment. Nasal washings and  aspirates are unacceptable for Xpert Xpress SARS-CoV-2/FLU/RSV  testing.  Fact Sheet for Patients: PinkCheek.be  Fact Sheet for Healthcare Providers: GravelBags.it  This test is not yet approved or cleared by the [place] FDA and  has been authorized for detection and/or diagnosis of SARS-CoV-2 by  FDA under an Emergency Use Authorization (EUA). This EUA will remain  in effect (meaning this test can be used) for the duration of the  Covid-19 declaration under Section 564(b)(1) of the Act, 21  U.S.C. section 360bbb-3(b)(1), unless the  authorization is  terminated or revoked. Performed at [facility], [address] [place], [place] [address]   CULTURE, BLOOD (ROUTINE X 2) w Reflex to ID Panel     Status: None   Collection Time: 10/02/20  1:16 PM   Specimen: BLOOD  Result Value Ref Range Status   Specimen Description BLOOD LEFT ANTECUBITAL  Final   Special Requests   Final    BOTTLES DRAWN AEROBIC AND ANAEROBIC Blood Culture adequate volume   Culture   Final    NO GROWTH 5 DAYS Performed at [facility], [address] [place], [place] [address]    Report Status 10/07/2020 FINAL  Final  CULTURE, BLOOD (ROUTINE X 2) w Reflex to ID Panel     Status: None   Collection Time: 10/02/20  1:33 PM   Specimen: BLOOD  Result Value Ref Range Status   Specimen Description BLOOD BLOOD LEFT HAND  Final   Special Requests   Final    BOTTLES  DRAWN AEROBIC AND ANAEROBIC Blood Culture adequate volume   Culture   Final    NO GROWTH 5 DAYS Performed at [facility], [address] [place], [place] [address]    Report Status 10/07/2020 FINAL  Final         Impression:   1)Renal    AKI secondary to ATN CKD stage 3B. CKD since 2013 CKD secondary to multiple factors Patient does have a history of renal cell carcinoma and is status post nephrectomy-low glomerular  mass Diabetes mellitus Hypertension Progression of CKD marked with acute kidney injury  Patient presented with a creatinine of 6.7 and was initiated on renal placement therapy on October 19. Patient did receive dialysis on October 19th,  20th and 21st. Patient creatinine improved from the peak of 8.8 on October 19 to 5.5 on October 22. Patient creatinine was high again at 6.2. Patient was dialyzed on October 23 as well.  We will check patient's Chem-7 in the morning In case patient GFR is not improving then will ask for tunneled catheter placement on Tuesday and plan thereafter   2)HTN    Blood pressure is stable     3)Anemia of chronic disease  CBC Latest Ref Rng & Units 10/07/2020 10/06/2020 10/06/2020  WBC 4.0 - 10.5 K/uL 5.6 - 5.1  Hemoglobin 13.0 - 17.0 g/dL 7.8(L) 8.0(L) 6.5(L)  Hematocrit 39 - 52 % 24.5(L) 24.7(L) 20.9(L)  Platelets 150 - 400 K/uL 162 - 169    Patient did receive PRBC yesterday   HGb is not at goal (9--11)   4) Secondary hyperparathyroidism -CKD [place]-Bone Disorder    Lab Results  Component Value Date   CALCIUM 8.0 (L) 10/07/2020   PHOS 5.0 (H) 10/07/2020    Secondary Hyperparathyroidism present  Phosphorus is now at goal. Dialysis did help  5) diabetes mellitus Being closely followed by the primary team   6) Electrolytes   BMP Latest Ref Rng & Units 10/07/2020 10/06/2020 10/05/2020  Glucose 70 - 99 mg/dL 144(H) 167(H) 152(H)  BUN 8 - 23 mg/dL 41(H) 59(H) 48(H)  Creatinine 0.61 - 1.24 mg/dL 4.70(H) 6.20(H) 5.05(H)  Sodium 135 - 145 mmol/L 141 143 138  Potassium 3.5 - 5.1 mmol/L 3.5 3.6 3.1(L)  Chloride 98 - 111 mmol/L 105 106 100  CO2 22 - 32 mmol/L 27 26 26   Calcium 8.9 - 10.3 mg/dL 8.0(L) 7.2(L) 7.2(L)     Sodium Normonatremic   Potassium Normokalemic Patient was hyperkalemic earlier   7)Acid base  Patient had acidosis earlier Patient bicarb is now better after stent placement therapy  Co2 at goal     Plan:   No need for renal replacement therapy today. We will check patient Chem-7 the morning and then decide about need for dialysis/need for tunneled catheter  I had extensive discussion with the patient and his spouse today and try to answer the question to the best of my ability   Allan [facility] 10/07/2020, 1:47 PM

## 2020-10-07 NOTE — Plan of Care (Signed)

## 2020-10-08 DIAGNOSIS — E1122 Type 2 diabetes mellitus with diabetic chronic kidney disease: Secondary | ICD-10-CM | POA: Diagnosis not present

## 2020-10-08 DIAGNOSIS — N179 Acute kidney failure, unspecified: Secondary | ICD-10-CM | POA: Diagnosis not present

## 2020-10-08 DIAGNOSIS — K529 Noninfective gastroenteritis and colitis, unspecified: Secondary | ICD-10-CM | POA: Diagnosis not present

## 2020-10-08 DIAGNOSIS — N1832 Chronic kidney disease, stage 3b: Secondary | ICD-10-CM | POA: Diagnosis not present

## 2020-10-08 LAB — CBC
HCT: 26.3 % — ABNORMAL LOW (ref 39.0–52.0)
Hemoglobin: 8.4 g/dL — ABNORMAL LOW (ref 13.0–17.0)
MCH: 31.2 pg (ref 26.0–34.0)
MCHC: 31.9 g/dL (ref 30.0–36.0)
MCV: 97.8 fL (ref 80.0–100.0)
Platelets: 195 10*3/uL (ref 150–400)
RBC: 2.69 MIL/uL — ABNORMAL LOW (ref 4.22–5.81)
RDW: 15 % (ref 11.5–15.5)
WBC: 7.1 10*3/uL (ref 4.0–10.5)
nRBC: 0 % (ref 0.0–0.2)

## 2020-10-08 LAB — BASIC METABOLIC PANEL
Anion gap: 12 (ref 5–15)
BUN: 56 mg/dL — ABNORMAL HIGH (ref 8–23)
CO2: 25 mmol/L (ref 22–32)
Calcium: 8.6 mg/dL — ABNORMAL LOW (ref 8.9–10.3)
Chloride: 105 mmol/L (ref 98–111)
Creatinine, Ser: 5.84 mg/dL — ABNORMAL HIGH (ref 0.61–1.24)
GFR, Estimated: 10 mL/min — ABNORMAL LOW (ref >60)
Glucose, Bld: 203 mg/dL — ABNORMAL HIGH (ref 70–99)
Potassium: 4.8 mmol/L (ref 3.5–5.1)
Sodium: 142 mmol/L (ref 135–145)

## 2020-10-08 LAB — GLUCOSE, CAPILLARY
Glucose-Capillary: 176 mg/dL — ABNORMAL HIGH (ref 70–99)
Glucose-Capillary: 185 mg/dL — ABNORMAL HIGH (ref 70–99)
Glucose-Capillary: 191 mg/dL — ABNORMAL HIGH (ref 70–99)
Glucose-Capillary: 178 mg/dL — ABNORMAL HIGH (ref 70–99)
Glucose-Capillary: 222 mg/dL — ABNORMAL HIGH (ref 70–99)

## 2020-10-08 LAB — MAGNESIUM: Magnesium: 2.2 mg/dL (ref 1.7–2.4)

## 2020-10-08 MED ORDER — INSULIN ASPART 100 UNIT/ML ~~LOC~~ SOLN
0.0000 [IU] | Freq: Three times a day (TID) | SUBCUTANEOUS | Status: DC
  Administered 2020-10-08 – 2020-10-11 (×5): 1 [IU] via SUBCUTANEOUS
  Administered 2020-10-12: 2 [IU] via SUBCUTANEOUS
  Filled 2020-10-08 (×6): qty 1

## 2020-10-08 NOTE — Progress Notes (Signed)
PROGRESS NOTE  [name] JEH:[id number] DOB: 07-29-1946 DOA: 09/30/2020 PCP: Derinda Late, MD   LOS: 8 days   Brief narrative: As per HPI,  [name] is a 74 y.o. 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  He was referred to the emergency room by his PCP because of lower extremity weakness and acute kidney injury. He was found to have acute kidney injury complicated by hyperkalemia, metabolic acidosis, uremic encephalopathy, myoclonic jerks, hyperphosphatemia and hypocalcemia.  He was treated with IV sodium bicarbonate infusion.  Nephrologist was consulted to assist with management.  Patient did have worsening kidney function with fluid overload and hypoxic respiratory failure.  Vascular surgery was consulted for a temporary dialysis catheter placement.  Patient underwent hemodialysis with improvement in his metabolic encephalopathy.     Assessment/Plan:   Principal Problem:   AKI (acute kidney injury) ([place]) Active Problems:   Essential hypertension   Type II diabetes mellitus with renal manifestations ([facility])   Anemia in chronic kidney disease   Spinal stenosis, lumbar region, with neurogenic claudication   Hyperkalemia   COPD (chronic obstructive pulmonary disease) ([place])   CKD stage 3 due to type 2 diabetes mellitus ([place])   History of renal cell carcinoma   History of left nephrectomy 2004   Hypoglycemia   Pancreatic insufficiency   Chronic diarrhea   Metabolic acidosis  AKI on CKD stage IIIb.  Currently receiving the intermittent hemodialysis for uremic symptoms, severe metabolic acidosis hyperkalemia and uremia which was present on admission.  His symptoms have  improved after initiation of hemodialysis..  Status post temporary hemodialysis catheter placement by vascular surgery.  Nephrology on board.  Further plans on dialysis as per nephrology.  Acute metabolic encephalopathy/uremic encephalopathy: Improving.  Continue hemodialysis.  Medications have been gradually restarted.  On oral thiamine as well.  Fever:  1 episode.  No source of infection.  No antibiotics.    Acute hypoxemic respiratory failure, currently on nasal cannula.  Wean down to 2 L from 5 L yesterday.  Continue incentive prescribed..  Chest x-ray from 10/07/2020 with mild left basilar atelectasis.  Hyperphosphatemia, hypocalcemia, mild hypomagnesemia: Improving.  Essential hypertension: Continue carvedilol.    History of DVT and PE: Continue Eliquis renal dosing.  Would benefit from same dosing on discharge.  Type II DM with recurrent hypoglycemia:  Off glipizide.  Improving hypoglycemia after dialysis.  Latest POC glucose of 176.  We will start the patient on low-dose sliding scale insulin.  Anemia of chronic kidney disease:   Continue to monitor.  No obvious signs of bleeding at this time.  Received 1 unit of packed RBC yesterday for hemoglobin of 6.5.  Hemoglobin of 8.4 today.  EPO/iron as per nephrology.  Serum iron of 44, TIBC 183.  Ferritin of 182.  Iron saturation at 24%.  Patient would benefit from IV iron as well.  Chronic pancreatitis/chronic diarrhea: Continue Creon   COPD: As needed bronchodilators.  compensated.   Chronic back pain: Hold off with narcotics for now.   Depression: Increased Remeron to 30 mg at nighttime.  Cymbalta still on hold.   Debility, weakness.  Seen by physical therapy who recommended skilled nursing facility placement on discharge.  DVT prophylaxis: apixaban (ELIQUIS) tablet 2.5 mg Start: 10/02/20 1000 apixaban (ELIQUIS)  tablet 2.5 mg     Code Status: Full code  Family Communication:  Spoke with the patient's wife at  bedside   Status is: Inpatient  Remains inpatient appropriate because:Persistent severe electrolyte disturbances, Unsafe d/c plan, IV treatments appropriate due to intensity of illness or inability to take PO, Inpatient level of care appropriate due to severity of illness and Need for hemodialysis,   Dispo: The patient is from: Home              Anticipated d/c is to: Physical therapy recommended skilled nursing facility placement on discharge.              Anticipated d/c date is: 2 to 3 days follow nephrology recommendations on discharge              Patient currently is not medically stable to d/c.  Consultants:  Vascular surgery  Nephrology  Procedures:  Hemodialysis  Hemodialysis catheter placement  Antibiotics:  . Rocephin IV 10/19> 10/22  Anti-infectives (From admission, onward)   Start     Dose/Rate Route Frequency Ordered Stop   10/02/20 1300  cefTRIAXone (ROCEPHIN) 1 g in sodium chloride 0.9 % 100 mL IVPB  Status:  Discontinued        1 g 200 mL/hr over 30 Minutes Intravenous Every 24 hours 10/02/20 1204 10/05/20 1104     Subjective: Today, patient was seen and examined at bedside patient continues to feel better.  His restless legs have improved with Requip.  Had a better night.  Denies any chest pain, shortness of breath cough.  Objective: Vitals:   10/08/20 0531 10/08/20 0755  BP: 131/74 (!) 155/72  Pulse: (!) 58 62  Resp: 19 18  Temp: (!) 97.5 F (36.4 C) 98.2 F (36.8 C)  SpO2: 97% 98%    Intake/Output Summary (Last 24 hours) at 10/08/2020 0809 Last data filed at 10/07/2020 1830 Gross per 24 hour  Intake 1200 ml  Output 650 ml  Net 550 ml   Filed Weights   10/06/20 0318 10/07/20 0315 10/08/20 0626  Weight: 115.3 kg 111.9 kg 116.8 kg   Body mass index is 34.91 kg/m.   Physical Exam:  General: Obese built,, not in obvious distress, hard of hearing, alert awake and communicative, obese.  On 2 L of nasal cannula oxygen HENT:   No scleral  pallor or icterus noted. Oral mucosa is moist.  Chest:   Diminished breath sounds bilaterally.  CVS: S1 &S2 heard. No murmur.  Regular rate and rhythm. Abdomen: Soft, nontender, nondistended.  Bowel sounds are heard.   Extremities: No cyanosis, clubbing or edema.  Peripheral pulses are palpable.  Right femoral hemodialysis catheter in place Psych: Alert, awake and communicative. CNS:  No cranial nerve deficits.  Power equal in all extremities.   Skin: Warm and dry.  No rashes noted.   Data Review: I have personally reviewed the following laboratory data and studies,  CBC: Recent Labs  Lab 10/03/20 0547 10/03/20 0547 10/05/20 0600 10/06/20 0500 10/06/20 2135 10/07/20 0529 10/08/20 0631  WBC 9.4  --  6.0 5.1  --  5.6 7.1  NEUTROABS  --   --  4.6  --   --   --   --   HGB 7.2*   < > 7.0* 6.5* 8.0* 7.8* 8.4*  HCT 22.1*   < > 21.4* 20.9* 24.7* 24.5* 26.3*  MCV 95.3  --  96.4 97.2  --  95.3 97.8  PLT 170  --  158 169  --  162 195   < > = values in this interval not displayed.   Basic Metabolic Panel: Recent Labs  Lab 10/03/20 0547 10/03/20 0547 10/04/20 0530 10/05/20 0600 10/06/20 0500 10/07/20 0529 10/08/20 0631  NA 141   < > 141 138 143 141 142  K 3.3*   < > 3.1* 3.1* 3.6 3.5 4.8  CL 107   < > 105 100 106 105 105  CO2 18*   < > 21* 26 26 27 25   GLUCOSE 103*   < > 117* 152* 167* 144* 203*  BUN 97*   < > 83* 48* 59* 41* 56*  CREATININE 7.20*   < > 6.80* 5.05* 6.20* 4.70* 5.84*  CALCIUM 6.3*   < > 6.5* 7.2* 7.2* 8.0* 8.6*  MG 1.5*   < > 1.6* 1.9 2.0 2.0 2.2  PHOS 7.9*  --  8.2* 5.6* 6.3* 5.0*  --    < > = values in this interval not displayed.   Liver Function Tests: Recent Labs  Lab 10/02/20 0528 10/03/20 0547  AST 14*  --   ALT 15  --   ALKPHOS 107  --   BILITOT 0.7  --   PROT 5.9*  --   ALBUMIN 3.0* 2.8*   No results for input(s): LIPASE, AMYLASE in the last 168 hours. No results for input(s): AMMONIA in the last 168 hours. Cardiac Enzymes: No results for  input(s): CKTOTAL, CKMB, CKMBINDEX, TROPONINI in the last 168 hours. BNP (last 3 results) Recent Labs    03/25/20 0557  BNP 31.0    ProBNP (last 3 results) No results for input(s): PROBNP in the last 8760 hours.  CBG: Recent Labs  Lab 10/07/20 1256 10/07/20 1712 10/07/20 2051 10/08/20 0733 10/08/20 0753  GLUCAP 163* 166* 202* 185* 176*   Recent Results (from the past 240 hour(s))  Respiratory Panel by RT PCR (Flu A&B, Covid) - Nasopharyngeal Swab     Status: None   Collection Time: 09/30/20  9:07 PM   Specimen: Nasopharyngeal Swab  Result Value Ref Range Status   SARS Coronavirus 2 by RT PCR NEGATIVE NEGATIVE Final    Comment: (NOTE) SARS-CoV-2 target nucleic acids are NOT DETECTED.  The SARS-CoV-2 RNA is generally detectable in upper respiratoy specimens during the acute phase of infection. The lowest concentration of SARS-CoV-2 viral copies this assay can detect is 131 copies/mL. A negative result does not preclude SARS-Cov-2 infection and should not be used as the sole basis for treatment or other patient management decisions. A negative result may occur with  improper specimen collection/handling, submission of specimen other than nasopharyngeal swab, presence of viral mutation(s) within the areas targeted by this assay, and inadequate number of viral copies (<131 copies/mL). A negative result must be combined with clinical observations, patient history, and epidemiological information. The expected result is Negative.  Fact Sheet for Patients:  PinkCheek.be  Fact Sheet for Healthcare Providers:  GravelBags.it  This test is no t yet approved or cleared by the [place] FDA and  has been authorized for detection and/or diagnosis of SARS-CoV-2 by FDA under an Emergency Use Authorization (EUA). This EUA will remain  in effect (meaning this test can be used) for the duration of the COVID-19 declaration  under Section 564(b)(1) of the Act, 21 U.S.C. section 360bbb-3(b)(1), unless the authorization is terminated or revoked sooner.     Influenza A by PCR NEGATIVE NEGATIVE Final   Influenza B by PCR NEGATIVE NEGATIVE Final    Comment: (NOTE)  The Xpert Xpress SARS-CoV-2/FLU/RSV assay is intended as an aid in  the diagnosis of influenza from Nasopharyngeal swab specimens and  should not be used as a sole basis for treatment. Nasal washings and  aspirates are unacceptable for Xpert Xpress SARS-CoV-2/FLU/RSV  testing.  Fact Sheet for Patients: PinkCheek.be  Fact Sheet for Healthcare Providers: GravelBags.it  This test is not yet approved or cleared by the [place] FDA and  has been authorized for detection and/or diagnosis of SARS-CoV-2 by  FDA under an Emergency Use Authorization (EUA). This EUA will remain  in effect (meaning this test can be used) for the duration of the  Covid-19 declaration under Section 564(b)(1) of the Act, 21  U.S.C. section 360bbb-3(b)(1), unless the authorization is  terminated or revoked. Performed at [facility], [address] [place], [place] [address]   CULTURE, BLOOD (ROUTINE X 2) w Reflex to ID Panel     Status: None   Collection Time: 10/02/20  1:16 PM   Specimen: BLOOD  Result Value Ref Range Status   Specimen Description BLOOD LEFT ANTECUBITAL  Final   Special Requests   Final    BOTTLES DRAWN AEROBIC AND ANAEROBIC Blood Culture adequate volume   Culture   Final    NO GROWTH 5 DAYS Performed at [facility], [address] [place], [place] [address]    Report Status 10/07/2020 FINAL  Final  CULTURE, BLOOD (ROUTINE X 2) w Reflex to ID Panel     Status: None   Collection Time: 10/02/20  1:33 PM   Specimen: BLOOD  Result Value Ref Range Status   Specimen Description BLOOD BLOOD LEFT HAND  Final   Special Requests   Final    BOTTLES DRAWN AEROBIC AND ANAEROBIC  Blood Culture adequate volume   Culture   Final    NO GROWTH 5 DAYS Performed at [facility], [address] [place], [place] [address]    Report Status 10/07/2020 FINAL  Final     Studies: DG Chest Port 1 View  Result Date: 10/07/2020 CLINICAL DATA:  Hypoxia. EXAM: PORTABLE CHEST 1 VIEW COMPARISON:  October 02, 2020 FINDINGS: Very mild, chronic appearing increased bronchovascular lung markings are seen. Very mild residual left basilar atelectasis and/or infiltrate is noted. There is no evidence of a pleural effusion or pneumothorax. The cardiac silhouette is mildly enlarged and unchanged in size. Degenerative changes seen throughout the thoracic spine. IMPRESSION: Very mild residual left basilar atelectasis and/or infiltrate. Electronically Signed   By: Virgina Norfolk M.D.   On: 10/07/2020 16:28      Flora Lipps, MD  [facility] 10/08/2020

## 2020-10-08 NOTE — NC FL2 (Signed)
[place] LEVEL OF CARE SCREENING TOOL     IDENTIFICATION  Patient Name: [name] Birthdate: September 01, 1946 Sex: male Admission Date (Current Location): 09/30/2020  [place] and [place] Number:      Facility and Address:  [facility], [address] [place], [place] [address]      Provider Number: [id number]  Attending Physician Name and Address:  Flora Lipps, MD  Relative Name and Phone Number:  Neoma Laming    Current Level of Care: Hospital Recommended Level of Care: [place] Prior Approval Number:    Date Approved/Denied:   PASRR Number: [id number]  Discharge Plan: SNF    Current Diagnoses: Patient Active Problem List   Diagnosis Date Noted  . History of renal cell carcinoma 09/30/2020  . History of left nephrectomy 2004 09/30/2020  . AKI (acute kidney injury) ([place]) 09/30/2020  . Hypoglycemia 09/30/2020  . Pancreatic insufficiency 09/30/2020  . Chronic diarrhea 09/30/2020  . Metabolic acidosis 46/27/0350  . Hand numbness 04/28/2020  . CAP (community acquired pneumonia) 03/25/2020  . Acute on chronic respiratory failure with hypoxia ([place]) 03/25/2020  . Hyperlipidemia   . COPD (chronic obstructive pulmonary disease) ([place])   . Depression   . CKD stage 3 due to type 2 diabetes mellitus ([place])   . Cellulitis 01/10/2020  . Proteinuria 09/20/2019  . Hyperkalemia 07/05/2019  . Lumbar degenerative disc disease 05/11/2019  . Spinal stenosis, lumbar region, with neurogenic claudication 05/11/2019  . Ankylosis of lumbar spine 05/11/2019  . Neuroforaminal stenosis of lumbar spine (left, L5/S1) 05/11/2019  . Lumbar facet arthropathy 05/11/2019  . Chronic pain syndrome 05/11/2019  . Chronic obstructive pulmonary disease ([place]) 01/03/2019  . Atherosclerotic peripheral vascular disease ([place]) 09/01/2018  . Lymphedema 06/22/2018  . Iron deficiency anemia 05/01/2018  . Anemia in chronic kidney disease  04/26/2018  . Varicose veins of leg with swelling, bilateral 04/09/2018  . SOB (shortness of breath) on exertion 03/17/2018  . Bilateral lower extremity edema 02/23/2018  . Lower extremity pain, bilateral 02/23/2018  . Essential hypertension 02/23/2018  . H/O deep venous thrombosis 01/23/2017  . Type II diabetes mellitus with renal manifestations ([place]) 06/19/2014  . Chronic kidney disease, unspecified 06/19/2014  . Anxiety state 06/19/2014  . Other and unspecified hyperlipidemia 06/19/2014    Orientation RESPIRATION BLADDER Height & Weight     Self, Time, Situation, Place  O2 External catheter Weight: 257 lb 6.4 oz (116.8 kg) Height:  6' (182.9 cm)  BEHAVIORAL SYMPTOMS/MOOD NEUROLOGICAL BOWEL NUTRITION STATUS      Continent    AMBULATORY STATUS COMMUNICATION OF NEEDS Skin   Extensive Assist Verbally                         Personal Care Assistance Level of Assistance  Bathing, Feeding, Dressing, Total care Bathing Assistance: Limited assistance Feeding assistance: Independent Dressing Assistance: Limited assistance Total Care Assistance: Limited assistance   Functional Limitations Info             SPECIAL CARE FACTORS FREQUENCY  PT (By licensed PT), OT (By licensed OT)     PT Frequency: 5 x weekly OT Frequency: 5 x weekly            Contractures      Additional Factors Info  Code Status Code Status Info: Full             Current Medications (10/08/2020):  This is the current hospital active medication list Current Facility-Administered Medications  Medication Dose Route Frequency Provider Last Rate Last Admin  . acetaminophen (TYLENOL) tablet 650 mg  650 mg Oral Q6H PRN Athena Masse, MD   650 mg at 10/07/20 3500   Or  . acetaminophen (TYLENOL) suppository 650 mg  650 mg Rectal Q6H PRN Athena Masse, MD      . albuterol (PROVENTIL) (2.5 MG/3ML) 0.083% nebulizer solution 2.5 mg  2.5 mg Nebulization Q6H PRN Jennye Boroughs, MD   2.5 mg at  10/02/20 2204  . apixaban (ELIQUIS) tablet 2.5 mg  2.5 mg Oral BID Jennye Boroughs, MD   2.5 mg at 10/08/20 0834  . carvedilol (COREG) tablet 6.25 mg  6.25 mg Oral BID WC Jennye Boroughs, MD   6.25 mg at 10/08/20 0834  . Chlorhexidine Gluconate Cloth 2 % PADS 6 each  6 each Topical Q0600 Jennye Boroughs, MD   6 each at 10/07/20 0543  . clonazePAM (KLONOPIN) tablet 0.5 mg  0.5 mg Oral BID PRN Jennye Boroughs, MD   0.5 mg at 10/07/20 2225  . feeding supplement (NEPRO CARB STEADY) liquid 237 mL  237 mL Oral BID BM Candiss Norse, Harmeet, MD   237 mL at 10/07/20 1326  . gabapentin (NEURONTIN) capsule 100 mg  100 mg Oral QHS Pokhrel, Laxman, MD   100 mg at 10/07/20 2226  . guaiFENesin (ROBITUSSIN) 100 MG/5ML solution 100 mg  5 mL Oral TID Murlean Iba, MD   100 mg at 10/08/20 0836  . insulin aspart (novoLOG) injection 0-6 Units  0-6 Units Subcutaneous TID WC Pokhrel, Laxman, MD      . lipase/protease/amylase (CREON) capsule 72,000 Units  72,000 Units Oral TID WC Pokhrel, Laxman, MD   72,000 Units at 10/08/20 0834  . mirtazapine (REMERON) tablet 30 mg  30 mg Oral QHS Pokhrel, Laxman, MD   30 mg at 10/07/20 2226  . ondansetron (ZOFRAN) tablet 4 mg  4 mg Oral Q6H PRN Athena Masse, MD       Or  . ondansetron [facility]) injection 4 mg  4 mg Intravenous Q6H PRN Athena Masse, MD      . oxymetazoline (AFRIN) 0.05 % nasal spray 1 spray  1 spray Each Nare BID Pokhrel, Laxman, MD   1 spray at 10/08/20 0836  . pantoprazole (PROTONIX) EC tablet 40 mg  40 mg Oral BID Jennye Boroughs, MD   40 mg at 10/08/20 0834  . rOPINIRole (REQUIP) tablet 4 mg  4 mg Oral BID AC Pokhrel, Laxman, MD   4 mg at 10/08/20 0834  . sodium chloride flush (NS) 0.9 % injection 3 mL  3 mL Intravenous Q12H Naaman Plummer, MD   3 mL at 10/08/20 0840  . sodium chloride flush (NS) 0.9 % injection 3 mL  3 mL Intravenous PRN Naaman Plummer, MD      . sucralfate (CARAFATE) tablet 1 g  1 g Oral TID Jennye Boroughs, MD   1 g at 10/08/20 0834  . thiamine  tablet 100 mg  100 mg Oral Daily Pokhrel, Laxman, MD   100 mg at 10/08/20 9381     Discharge Medications: Please see discharge summary for a list of discharge medications.  Relevant Imaging Results:  Relevant Lab Results:   Additional Information SS# [id number]  Meriel Flavors, LCSW

## 2020-10-08 NOTE — Progress Notes (Signed)
[facility]  ROUNDING NOTE   Subjective:   Mr. Herms is 74 years old male with past medical history of diabetes, hypertension, GERD, renal cell carcinoma post left nephrectomy in 2004, COPD, pancreatic insufficiency, CKD stage III , anemia of CKD sent in to the ED by his PCP for abnormal renal function test and progressively worsening bilateral lower extremity weakness. Patient's baseline creatinine is 2.2/GFR 30 from 06/26/2020  Patient received dialysis treatment on Saturday,tolerated well.  Patient appears pleasant, wife at the bedside, appetite and oral intake improved.  No complaints of nausea, vomiting or shortness of breath.  Objective:  Vital signs in last 24 hours:  Temp:  [97.5 F (36.4 C)-98.9 F (37.2 C)] 98.2 F (36.8 C) (10/25 0755) Pulse Rate:  [58-72] 62 (10/25 0755) Resp:  [16-19] 18 (10/25 0755) BP: (117-155)/(55-77) 155/72 (10/25 0755) SpO2:  [94 %-98 %] 98 % (10/25 0755) Weight:  [116.8 kg] 116.8 kg (10/25 0626)  Weight change: 4.808 kg Filed Weights   10/06/20 0318 10/07/20 0315 10/08/20 0626  Weight: 115.3 kg 111.9 kg 116.8 kg    Intake/Output: I/O last 3 completed shifts: In: 1560 [P.O.:1200; Blood:360] Out: 1700 [Urine:1700]   Intake/Output this shift:  Total I/O In: -  Out: 800 [Urine:800]  Physical Exam: General:  Awake,alert,pleasant  Head: Moist oral mucosal membranes  Eyes: Anicteric  Lungs:   Lungs diminished at the bases, 2L O2 via Nasal cannula,Respiration even,unlabored  Heart: Regular rate and rhythm  Abdomen:  Soft, nontender, non distended  Extremities:  No peripheral edema.  Neurologic:  Alert,Awake,speech clear and appropriate   Skin: No acute lesions or rashes  RT.Femoral Catheter  Basic Metabolic Panel: Recent Labs  Lab 10/03/20 0547 10/03/20 0547 10/04/20 0530 10/04/20 0530 10/05/20 0600 10/05/20 0600 10/06/20 0500 10/07/20 0529 10/08/20 0631  NA 141   < > 141  --  138  --  143 141 142  K 3.3*   <  > 3.1*  --  3.1*  --  3.6 3.5 4.8  CL 107   < > 105  --  100  --  106 105 105  CO2 18*   < > 21*  --  26  --  26 27 25   GLUCOSE 103*   < > 117*  --  152*  --  167* 144* 203*  BUN 97*   < > 83*  --  48*  --  59* 41* 56*  CREATININE 7.20*   < > 6.80*  --  5.05*  --  6.20* 4.70* 5.84*  CALCIUM 6.3*   < > 6.5*   < > 7.2*   < > 7.2* 8.0* 8.6*  MG 1.5*   < > 1.6*  --  1.9  --  2.0 2.0 2.2  PHOS 7.9*  --  8.2*  --  5.6*  --  6.3* 5.0*  --    < > = values in this interval not displayed.    Liver Function Tests: Recent Labs  Lab 10/02/20 0528 10/03/20 0547  AST 14*  --   ALT 15  --   ALKPHOS 107  --   BILITOT 0.7  --   PROT 5.9*  --   ALBUMIN 3.0* 2.8*   No results for input(s): LIPASE, AMYLASE in the last 168 hours. No results for input(s): AMMONIA in the last 168 hours.  CBC: Recent Labs  Lab 10/03/20 0547 10/03/20 0547 10/05/20 0600 10/06/20 0500 10/06/20 2135 10/07/20 0529 10/08/20 0631  WBC 9.4  --  6.0 5.1  --  5.6 7.1  NEUTROABS  --   --  4.6  --   --   --   --   HGB 7.2*   < > 7.0* 6.5* 8.0* 7.8* 8.4*  HCT 22.1*   < > 21.4* 20.9* 24.7* 24.5* 26.3*  MCV 95.3  --  96.4 97.2  --  95.3 97.8  PLT 170  --  158 169  --  162 195   < > = values in this interval not displayed.    Cardiac Enzymes: No results for input(s): CKTOTAL, CKMB, CKMBINDEX, TROPONINI in the last 168 hours.  BNP: Invalid input(s): POCBNP  CBG: Recent Labs  Lab 10/07/20 1256 10/07/20 1712 10/07/20 2051 10/08/20 0733 10/08/20 0753  GLUCAP 163* 166* 202* 185* 176*    Microbiology: Results for orders placed or performed during the hospital encounter of 09/30/20  Respiratory Panel by RT PCR (Flu A&B, Covid) - Nasopharyngeal Swab     Status: None   Collection Time: 09/30/20  9:07 PM   Specimen: Nasopharyngeal Swab  Result Value Ref Range Status   SARS Coronavirus 2 by RT PCR NEGATIVE NEGATIVE Final    Comment: (NOTE) SARS-CoV-2 target nucleic acids are NOT DETECTED.  The SARS-CoV-2 RNA is  generally detectable in upper respiratoy specimens during the acute phase of infection. The lowest concentration of SARS-CoV-2 viral copies this assay can detect is 131 copies/mL. A negative result does not preclude SARS-Cov-2 infection and should not be used as the sole basis for treatment or other patient management decisions. A negative result may occur with  improper specimen collection/handling, submission of specimen other than nasopharyngeal swab, presence of viral mutation(s) within the areas targeted by this assay, and inadequate number of viral copies (<131 copies/mL). A negative result must be combined with clinical observations, patient history, and epidemiological information. The expected result is Negative.  Fact Sheet for Patients:  PinkCheek.be  Fact Sheet for Healthcare Providers:  GravelBags.it  This test is no t yet approved or cleared by the [place] FDA and  has been authorized for detection and/or diagnosis of SARS-CoV-2 by FDA under an Emergency Use Authorization (EUA). This EUA will remain  in effect (meaning this test can be used) for the duration of the COVID-19 declaration under Section 564(b)(1) of the Act, 21 U.S.C. section 360bbb-3(b)(1), unless the authorization is terminated or revoked sooner.     Influenza A by PCR NEGATIVE NEGATIVE Final   Influenza B by PCR NEGATIVE NEGATIVE Final    Comment: (NOTE) The Xpert Xpress SARS-CoV-2/FLU/RSV assay is intended as an aid in  the diagnosis of influenza from Nasopharyngeal swab specimens and  should not be used as a sole basis for treatment. Nasal washings and  aspirates are unacceptable for Xpert Xpress SARS-CoV-2/FLU/RSV  testing.  Fact Sheet for Patients: PinkCheek.be  Fact Sheet for Healthcare Providers: GravelBags.it  This test is not yet approved or cleared by the [place] FDA and  has been authorized for detection and/or diagnosis of SARS-CoV-2 by  FDA under an Emergency Use Authorization (EUA). This EUA will remain  in effect (meaning this test can be used) for the duration of the  Covid-19 declaration under Section 564(b)(1) of the Act, 21  U.S.C. section 360bbb-3(b)(1), unless the authorization is  terminated or revoked. Performed at [facility], [place], [place] [address]   CULTURE, BLOOD (ROUTINE X 2) w Reflex to ID Panel     Status: None   Collection  Time: 10/02/20  1:16 PM   Specimen: BLOOD  Result Value Ref Range Status   Specimen Description BLOOD LEFT ANTECUBITAL  Final   Special Requests   Final    BOTTLES DRAWN AEROBIC AND ANAEROBIC Blood Culture adequate volume   Culture   Final    NO GROWTH 5 DAYS Performed at [facility], [address] [place], [place] [address]    Report Status 10/07/2020 FINAL  Final  CULTURE, BLOOD (ROUTINE X 2) w Reflex to ID Panel     Status: None   Collection Time: 10/02/20  1:33 PM   Specimen: BLOOD  Result Value Ref Range Status   Specimen Description BLOOD BLOOD LEFT HAND  Final   Special Requests   Final    BOTTLES DRAWN AEROBIC AND ANAEROBIC Blood Culture adequate volume   Culture   Final    NO GROWTH 5 DAYS Performed at [facility], [address] [place], [place] [address]    Report Status 10/07/2020 FINAL  Final    Coagulation Studies: No results for input(s): LABPROT, INR in the last 72 hours.  Urinalysis: No results for input(s): COLORURINE, LABSPEC, PHURINE, GLUCOSEU, HGBUR, BILIRUBINUR, KETONESUR, PROTEINUR, UROBILINOGEN, NITRITE, LEUKOCYTESUR in the last 72 hours.  Invalid input(s): APPERANCEUR    Imaging: DG Chest Port 1 View  Result Date: 10/07/2020 CLINICAL DATA:  Hypoxia. EXAM: PORTABLE CHEST 1 VIEW COMPARISON:  October 02, 2020 FINDINGS: Very mild, chronic appearing increased bronchovascular lung markings are seen. Very  mild residual left basilar atelectasis and/or infiltrate is noted. There is no evidence of a pleural effusion or pneumothorax. The cardiac silhouette is mildly enlarged and unchanged in size. Degenerative changes seen throughout the thoracic spine. IMPRESSION: Very mild residual left basilar atelectasis and/or infiltrate. Electronically Signed   By: Virgina Norfolk M.D.   On: 10/07/2020 16:28     Medications:    . apixaban  2.5 mg Oral BID  . carvedilol  6.25 mg Oral BID WC  . Chlorhexidine Gluconate Cloth  6 each Topical Q0600  . feeding supplement (NEPRO CARB STEADY)  237 mL Oral BID BM  . gabapentin  100 mg Oral QHS  . guaiFENesin  5 mL Oral TID  . insulin aspart  0-6 Units Subcutaneous TID WC  . lipase/protease/amylase  72,000 Units Oral TID WC  . mirtazapine  30 mg Oral QHS  . oxymetazoline  1 spray Each Nare BID  . pantoprazole  40 mg Oral BID  . rOPINIRole  4 mg Oral BID AC  . sodium chloride flush  3 mL Intravenous Q12H  . sucralfate  1 g Oral TID  . thiamine  100 mg Oral Daily   acetaminophen **OR** acetaminophen, albuterol, clonazePAM, ondansetron **OR** ondansetron (ZOFRAN) IV, sodium chloride flush  Assessment/ Plan:  Mr. Jaryd Drew is a 74 y.o.  male Mr. Minion is 74 years old male with past medical history of diabetes, hypertension, GERD, renal cell carcinoma post left nephrectomy in 2004, COPD, pancreatic insufficiency, CKD stage III , anemia of CKD sent in to the ED by his PCP for abnormal renal function test and progressively worsening bilateral lower extremity weakness.  # Acute kidney injury on CKD stage IIIa #Metabolic acidosis -Patient  has history of renal cell carcinoma with left nephrectomy in 2004 Patient's baseline creatinine is 2.2/GFR 30 from 06/26/2020 Creatinine today is 5.84 BUN 56 CO2 25 -Urine Output for the preceding 24 hours is 650 ml -Will plan for dialysis tomorrow  - We will also  plan for Permocath placement tomorrow  10/09/2020   #Hypokalemia Normalized Potassium 4.8    #Anemia of CKD Lab Results  Component Value Date   HGB 8.4 (L) 10/08/2020     #Hypoglycemia HbA1C on 09/30/20 4.9 Multiple Hypoglycemic episodes during this admission Blood sugar readings within acceptable range today      LOS: 8 Yoali Conry 10/25/20219:22 AM

## 2020-10-08 NOTE — Progress Notes (Signed)
Mobility Specialist - Progress Note   10/08/20 1300  Mobility  Range of Motion/Exercises Right leg;Left leg (ankle pumps, slr, hip add, heel slides, quad sets, )  Level of Assistance Standby assist, set-up cues, supervision of patient - no hands on  Assistive Device None  Distance Ambulated (ft) 0 ft  Mobility Response Tolerated well  Mobility performed by Mobility specialist  $Mobility charge 1 Mobility    Pre-mobility: 69 HR, 96% SpO2 Post-mobility: 75 HR, 91% SpO2   Pt was lying in bed upon arrival with wife present in room. Pt was hesitant, but with encouragement from wife, pt agreed to session. Pt was agitated at time of arrival, stating that he "is confused about treatment plans as everyone is telling me something different". Pt was able to perform supine exercises: ankle pumps, quad sets, straight leg raises, heel slides, and hip abd/add with SBA. OOB activity deferred this date d/t temporary HD cath placement. Overall, pt tolerated session well. Pt remains in bed with all needs in reach and alarm set.    Kathee Delton Mobility Specialist 10/08/20, 1:51 PM

## 2020-10-08 NOTE — TOC Progression Note (Signed)
Transition of Care [facility]) - Progression Note    Patient Details  Name: [name] MRN: [id number] Date of Birth: Jan 09, 1946  Transition of Care [facility]) CM/SW [place], LCSW Phone Number: 10/08/2020, 10:29 AM  Clinical Narrative:    CSW met with patient and wife at bedside, completed ReAdm screen and discussed discharge recommendations. Patient and wife agreeable to skilled nursing for Rehab. CSW discussed options, patient would prefer [facility] or [facility]. CSW explained the process for seeking placement, if there is availability at his preference CSW will accept offer.   CSW completed SNF work-up, PASRR and sent out for offers      Expected Discharge Plan and Services                                                 Social Determinants of Health (SDOH) Interventions    Readmission Risk Interventions Readmission Risk Prevention Plan 10/08/2020 07/08/2019  Transportation Screening Complete Complete  PCP or Specialist Appt within 3-5 Days Complete Complete  HRI or [place] Complete Complete  Social Work Consult for [place] Planning/Counseling Complete Complete  Palliative Care Screening Not Applicable Not Applicable  Medication Review [employer]) Complete Complete  Some recent data might be hidden

## 2020-10-09 ENCOUNTER — Other Ambulatory Visit (INDEPENDENT_AMBULATORY_CARE_PROVIDER_SITE_OTHER): Payer: Self-pay | Admitting: Vascular Surgery

## 2020-10-09 ENCOUNTER — Inpatient Hospital Stay
Admission: EM | Disposition: A | Discharge status: Home / Self Care | Payer: Self-pay | Source: Ambulatory Visit | Attending: Internal Medicine

## 2020-10-09 DIAGNOSIS — K529 Noninfective gastroenteritis and colitis, unspecified: Secondary | ICD-10-CM | POA: Diagnosis not present

## 2020-10-09 DIAGNOSIS — E1122 Type 2 diabetes mellitus with diabetic chronic kidney disease: Secondary | ICD-10-CM | POA: Diagnosis not present

## 2020-10-09 DIAGNOSIS — N1832 Chronic kidney disease, stage 3b: Secondary | ICD-10-CM | POA: Diagnosis not present

## 2020-10-09 DIAGNOSIS — N179 Acute kidney failure, unspecified: Secondary | ICD-10-CM | POA: Diagnosis not present

## 2020-10-09 HISTORY — PX: DIALYSIS/PERMA CATHETER INSERTION: CATH118288

## 2020-10-09 LAB — CBC
HCT: 25.5 % — ABNORMAL LOW (ref 39.0–52.0)
Hemoglobin: 8.1 g/dL — ABNORMAL LOW (ref 13.0–17.0)
MCH: 30.9 pg (ref 26.0–34.0)
MCHC: 31.8 g/dL (ref 30.0–36.0)
MCV: 97.3 fL (ref 80.0–100.0)
Platelets: 190 10*3/uL (ref 150–400)
RBC: 2.62 MIL/uL — ABNORMAL LOW (ref 4.22–5.81)
RDW: 14.8 % (ref 11.5–15.5)
WBC: 7.5 10*3/uL (ref 4.0–10.5)
nRBC: 0 % (ref 0.0–0.2)

## 2020-10-09 LAB — GLUCOSE, CAPILLARY
Glucose-Capillary: 163 mg/dL — ABNORMAL HIGH (ref 70–99)
Glucose-Capillary: 129 mg/dL — ABNORMAL HIGH (ref 70–99)
Glucose-Capillary: 123 mg/dL — ABNORMAL HIGH (ref 70–99)
Glucose-Capillary: 228 mg/dL — ABNORMAL HIGH (ref 70–99)

## 2020-10-09 LAB — TYPE AND SCREEN
ABO/RH(D): O POS
Antibody Screen: POSITIVE
Donor AG Type: NEGATIVE
Unit division: 0
Unit division: 0
Unit division: 0

## 2020-10-09 LAB — BPAM RBC
Blood Product Expiration Date: 202111132359
Blood Product Expiration Date: 202111132359
Blood Product Expiration Date: 202111262359
ISSUE DATE / TIME: 202110231644
Unit Type and Rh: 5100
Unit Type and Rh: 5100
Unit Type and Rh: 5100

## 2020-10-09 LAB — BASIC METABOLIC PANEL
Anion gap: 10 (ref 5–15)
BUN: 69 mg/dL — ABNORMAL HIGH (ref 8–23)
CO2: 23 mmol/L (ref 22–32)
Calcium: 8.1 mg/dL — ABNORMAL LOW (ref 8.9–10.3)
Chloride: 106 mmol/L (ref 98–111)
Creatinine, Ser: 6.71 mg/dL — ABNORMAL HIGH (ref 0.61–1.24)
GFR, Estimated: 8 mL/min — ABNORMAL LOW (ref >60)
Glucose, Bld: 211 mg/dL — ABNORMAL HIGH (ref 70–99)
Potassium: 4.1 mmol/L (ref 3.5–5.1)
Sodium: 139 mmol/L (ref 135–145)

## 2020-10-09 LAB — MAGNESIUM: Magnesium: 2.2 mg/dL (ref 1.7–2.4)

## 2020-10-09 SURGERY — DIALYSIS/PERMA CATHETER INSERTION
Anesthesia: Choice

## 2020-10-09 MED ORDER — FENTANYL CITRATE (PF) 100 MCG/2ML IJ SOLN
INTRAMUSCULAR | Status: AC
  Filled 2020-10-09: qty 2

## 2020-10-09 MED ORDER — EPOETIN ALFA 10000 UNIT/ML IJ SOLN
4000.0000 [IU] | INTRAMUSCULAR | Status: DC
  Administered 2020-10-11 – 2020-10-12 (×2): 4000 [IU] via INTRAVENOUS
  Filled 2020-10-09: qty 1

## 2020-10-09 MED ORDER — FENTANYL CITRATE (PF) 100 MCG/2ML IJ SOLN
INTRAMUSCULAR | Status: DC | PRN
  Administered 2020-10-09 (×2): 25 ug via INTRAVENOUS
  Administered 2020-10-09: 50 ug via INTRAVENOUS

## 2020-10-09 MED ORDER — MIDAZOLAM HCL 2 MG/2ML IJ SOLN
INTRAMUSCULAR | Status: DC | PRN
  Administered 2020-10-09 (×3): 1 mg via INTRAVENOUS

## 2020-10-09 MED ORDER — CEFAZOLIN SODIUM-DEXTROSE 1-4 GM/50ML-% IV SOLN
INTRAVENOUS | Status: AC
  Filled 2020-10-09: qty 50

## 2020-10-09 MED ORDER — DULOXETINE HCL 30 MG PO CPEP
30.0000 mg | ORAL_CAPSULE | Freq: Every day | ORAL | Status: DC
  Administered 2020-10-10 – 2020-10-12 (×3): 30 mg via ORAL
  Filled 2020-10-09 (×3): qty 1

## 2020-10-09 MED ORDER — MIDAZOLAM HCL 5 MG/5ML IJ SOLN
INTRAMUSCULAR | Status: AC
  Filled 2020-10-09: qty 5

## 2020-10-09 SURGICAL SUPPLY — 11 items
CATH PALINDROME-P 19CM W/VT (CATHETERS) ×3 IMPLANT
COVER PROBE U/S 5X48 (MISCELLANEOUS) ×3 IMPLANT
DERMABOND ADVANCED (GAUZE/BANDAGES/DRESSINGS) ×2
DERMABOND ADVANCED .7 DNX12 (GAUZE/BANDAGES/DRESSINGS) ×1 IMPLANT
GUIDEWIRE AMPLATZ SHORT (WIRE) ×3 IMPLANT
NEEDLE ENTRY 21GA 7CM ECHOTIP (NEEDLE) ×3 IMPLANT
PACK ANGIOGRAPHY (CUSTOM PROCEDURE TRAY) ×3 IMPLANT
SET INTRO CAPELLA COAXIAL (SET/KITS/TRAYS/PACK) ×3 IMPLANT
SUT MNCRL AB 4-0 PS2 18 (SUTURE) ×3 IMPLANT
SUT PROLENE 0 CT 1 30 (SUTURE) ×3 IMPLANT
SUT SILK 0 FSL (SUTURE) ×3 IMPLANT

## 2020-10-09 NOTE — Progress Notes (Signed)
Results for EDD, REPPERT (MRN [id number]) as of 10/09/2020 08:44  Ref. Range 10/02/2020 17:46  Hepatitis B Surface Ag Latest Ref Range: NON REACTIVE  NON REACTIVE  Hep B S Ab Latest Ref Range: NON REACTIVE  NON REACTIVE  Hep B Core Ab, IgM Latest Ref Range: NON REACTIVE    Hep B Core Total Ab Latest Ref Range: NON REACTIVE  NON REACTIVE

## 2020-10-09 NOTE — Progress Notes (Signed)
PT Cancellation Note  Patient Details Name: [name] MRN: [id number] DOB: 06-02-1946   Cancelled Treatment:    Reason Eval/Treat Not Completed: Patient at procedure or test/unavailable. Patient currently out of the room getting hemodialysis and later is expected to have permocath placement with removal of temporary dialysis access. PT will follow up as appropriate and as patient available.   Minna Merritts, PT, MPT  Percell Locus 10/09/2020, 12:29 PM

## 2020-10-09 NOTE — Care Management Important Message (Signed)
Important Message  Patient Details  Name: Cypher Paule MRN: [id number] Date of Birth: August 15, 1946   Medicare Important Message Given:  Yes     Dannette Barbara 10/09/2020, 11:54 AM

## 2020-10-09 NOTE — Plan of Care (Signed)
  Problem: Education: Goal: Knowledge of General Education information will improve Description: Including pain rating scale, medication(s)/side effects and non-pharmacologic comfort measures Outcome: Progressing   Problem: Health Behavior/Discharge Planning: Goal: Ability to manage health-related needs will improve Outcome: Progressing   Problem: Clinical Measurements: Goal: Ability to maintain clinical measurements within normal limits will improve Outcome: Progressing Goal: Cardiovascular complication will be avoided Outcome: Progressing   Problem: Elimination: Goal: Will not experience complications related to bowel motility Outcome: Progressing   Problem: Safety: Goal: Ability to remain free from injury will improve Outcome: Progressing

## 2020-10-09 NOTE — Progress Notes (Addendum)
PROGRESS NOTE  [name] LNL:[id number] DOB: Sep 15, 1946 DOA: 09/30/2020 PCP: Derinda Late, MD   LOS: 9 days   Brief narrative: As per HPI,  [name] is a 74 y.o. 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  He was referred to the emergency room by his PCP because of lower extremity weakness and acute kidney injury. He was found to have acute kidney injury complicated by hyperkalemia, metabolic acidosis, uremic encephalopathy, myoclonic jerks, hyperphosphatemia and hypocalcemia.  He was treated with IV sodium bicarbonate infusion.  Nephrologist was consulted to assist with management.  Patient did have worsening kidney function with fluid overload and hypoxic respiratory failure.  Vascular surgery was consulted for a temporary dialysis catheter placement.  Patient underwent hemodialysis with improvement in his metabolic encephalopathy.     Assessment/Plan:   Principal Problem:   AKI (acute kidney injury) ([place]) Active Problems:   Essential hypertension   Type II diabetes mellitus with renal manifestations (HCC)   Anemia in chronic kidney disease   Spinal stenosis, lumbar region, with neurogenic claudication   Hyperkalemia   COPD (chronic obstructive pulmonary disease) ([place])   CKD stage 3 due to type 2 diabetes mellitus ([place])   History of renal cell carcinoma   History of left nephrectomy 2004   Hypoglycemia   Pancreatic insufficiency   Chronic diarrhea   Metabolic acidosis  AKI on CKD stage IIIb.  History of left nephrectomy for renal cell cancer.  Currently receiving the intermittent hemodialysis.  Patient presented with severe uremic symptoms, severe metabolic  acidosis, hyperkalemia .  His symptoms have largely improved after initiation of hemodialysis.  Nephrology on board and plan is placement of permacath.  Vascular surgery has been notified.    Acute metabolic encephalopathy/uremic encephalopathy: Improving.  Continue hemodialysis.  Medications have been gradually restarted.  On oral thiamine as well.   Acute hypoxemic respiratory failure, currently on nasal cannula.  Oxygen has been weaned down to 2 L from 5 L..  Chest x-ray from 10/07/2020 with mild left basilar atelectasis.  Patient was encouraged incentive as permitted deep breathing.  Hemodialysis for volume management.  Hyperkalemia has improved with hemodialysis.    Hypomagnesemia has improved.  Essential hypertension: Continue carvedilol.  Blood pressure was largely controlled but slightly higher today.  Continue to monitor.  History of DVT and PE: Continue Eliquis renal dosing.  Would benefit from same dosing on discharge.  Type II DM with recurrent hypoglycemia on presentation:  Off glipizide.  Improving hypoglycemia after dialysis.  Latest POC glucose of 163.Marland Kitchen  Patient has been started on sensitive scale sliding scale insulin since yesterday.  Anemia of chronic kidney disease:  Received 1 unit of packed RBC for hemoglobin of 6.5.  Hemoglobin of 8.1 today..  EPO/iron as recommended per nephrology.  Serum iron of 44, TIBC 183.  Ferritin of 182.  Iron saturation at 24%.    Chronic pancreatitis/chronic diarrhea: Continue Creon    COPD: As needed bronchodilators.  compensated.   Chronic back pain: Hold off with narcotics for now.   Depression: Has been resumed Remeron to 30 mg at nighttime.  Will resume Cymbalta, but start at a lower dose due to renal insufficiency.  Debility, weakness.  Awaiting for skilled nursing facility placement.  DVT prophylaxis: apixaban (ELIQUIS)  tablet 2.5 mg Start: 10/02/20 1000 apixaban (ELIQUIS) tablet 2.5 mg      Code Status: Full code  Family  Communication: Spoke with the patient's wife at bedside and updated her about the clinical condition of the patient.   Status is: Inpatient  Remains inpatient appropriate because:Persistent severe electrolyte disturbances, Unsafe d/c plan, IV treatments appropriate due to intensity of illness or inability to take PO, Inpatient level of care appropriate due to severity of illness and Need for hemodialysis, need for permacath placement and outpatient hemodialysis.   Dispo: The patient is from: Home              Anticipated d/c is to: skilled nursing facility placement on discharge.              Anticipated d/c date is: 2 to 3 days, will need outpatient hemodialysis set up, pending permacath placement today,              Patient currently is not medically stable to d/c.  Consultants:  Vascular surgery  Nephrology  Procedures:  Hemodialysis  Hemodialysis catheter placement  Antibiotics:  . Rocephin IV 10/19> 10/22   Subjective: Today, patient was seen and examined during hemodialysis.  Patient denies shortness of breath, chest pain, fever, nausea, vomiting.  Feels better.  Inquiring about his procedure today.  Currently n.p.o.  Objective: Vitals:   10/09/20 0539 10/09/20 0807  BP: (!) 141/70 (!) 141/62  Pulse: (!) 55 63  Resp: 20 19  Temp: 98.2 F (36.8 C) 97.9 F (36.6 C)  SpO2: 97% 100%    Intake/Output Summary (Last 24 hours) at 10/09/2020 0854 Last data filed at 10/08/2020 1744 Gross per 24 hour  Intake 480 ml  Output 825 ml  Net -345 ml   Filed Weights   10/06/20 0318 10/07/20 0315 10/08/20 0626  Weight: 115.3 kg 111.9 kg 116.8 kg   Body mass index is 34.91 kg/m.   Physical Exam:  General: Obese built, not in obvious distress, hard of hearing alert awake and communicative, on 2 L of nasal cannula oxygen HENT:   No scleral pallor or icterus noted. Oral mucosa is moist.  Chest:  Clear breath sounds.  Diminished breath sounds bilaterally. No crackles or  wheezes.  CVS: S1 &S2 heard. No murmur.  Regular rate and rhythm. Abdomen: Soft, nontender, nondistended.  Bowel sounds are heard.   Extremities: No cyanosis, clubbing or edema.  Peripheral pulses are palpable.  Right femoral hemodialysis catheter in place. Psych: Alert, awake and communicative, normal mood CNS:  No cranial nerve deficits.  Power equal in all extremities.   Skin: Warm and dry.  No rashes noted.   Data Review: I have personally reviewed the following laboratory data and studies,  CBC: Recent Labs  Lab 10/05/20 0600 10/05/20 0600 10/06/20 0500 10/06/20 2135 10/07/20 0529 10/08/20 0631 10/09/20 0445  WBC 6.0  --  5.1  --  5.6 7.1 7.5  NEUTROABS 4.6  --   --   --   --   --   --   HGB 7.0*   < > 6.5* 8.0* 7.8* 8.4* 8.1*  HCT 21.4*   < > 20.9* 24.7* 24.5* 26.3* 25.5*  MCV 96.4  --  97.2  --  95.3 97.8 97.3  PLT 158  --  169  --  162 195 190   < > = values in this interval not displayed.   Basic Metabolic Panel: Recent Labs  Lab 10/03/20 0547 10/03/20 0547 10/04/20 0530 10/04/20  0530 10/05/20 0600 10/06/20 0500 10/07/20 0529 10/08/20 0631 10/09/20 0445  NA 141   < > 141   < > 138 143 141 142 139  K 3.3*   < > 3.1*   < > 3.1* 3.6 3.5 4.8 4.1  CL 107   < > 105   < > 100 106 105 105 106  CO2 18*   < > 21*   < > 26 26 27 25 23   GLUCOSE 103*   < > 117*   < > 152* 167* 144* 203* 211*  BUN 97*   < > 83*   < > 48* 59* 41* 56* 69*  CREATININE 7.20*   < > 6.80*   < > 5.05* 6.20* 4.70* 5.84* 6.71*  CALCIUM 6.3*   < > 6.5*   < > 7.2* 7.2* 8.0* 8.6* 8.1*  MG 1.5*   < > 1.6*   < > 1.9 2.0 2.0 2.2 2.2  PHOS 7.9*  --  8.2*  --  5.6* 6.3* 5.0*  --   --    < > = values in this interval not displayed.   Liver Function Tests: Recent Labs  Lab 10/03/20 0547  ALBUMIN 2.8*   No results for input(s): LIPASE, AMYLASE in the last 168 hours. No results for input(s): AMMONIA in the last 168 hours. Cardiac Enzymes: No results for input(s): CKTOTAL, CKMB, CKMBINDEX,  TROPONINI in the last 168 hours. BNP (last 3 results) Recent Labs    03/25/20 0557  BNP 31.0    ProBNP (last 3 results) No results for input(s): PROBNP in the last 8760 hours.  CBG: Recent Labs  Lab 10/08/20 0753 10/08/20 1145 10/08/20 1639 10/08/20 2134 10/09/20 0805  GLUCAP 176* 191* 178* 222* 163*   Recent Results (from the past 240 hour(s))  Respiratory Panel by RT PCR (Flu A&B, Covid) - Nasopharyngeal Swab     Status: None   Collection Time: 09/30/20  9:07 PM   Specimen: Nasopharyngeal Swab  Result Value Ref Range Status   SARS Coronavirus 2 by RT PCR NEGATIVE NEGATIVE Final    Comment: (NOTE) SARS-CoV-2 target nucleic acids are NOT DETECTED.  The SARS-CoV-2 RNA is generally detectable in upper respiratoy specimens during the acute phase of infection. The lowest concentration of SARS-CoV-2 viral copies this assay can detect is 131 copies/mL. A negative result does not preclude SARS-Cov-2 infection and should not be used as the sole basis for treatment or other patient management decisions. A negative result may occur with  improper specimen collection/handling, submission of specimen other than nasopharyngeal swab, presence of viral mutation(s) within the areas targeted by this assay, and inadequate number of viral copies (<131 copies/mL). A negative result must be combined with clinical observations, patient history, and epidemiological information. The expected result is Negative.  Fact Sheet for Patients:  PinkCheek.be  Fact Sheet for Healthcare Providers:  GravelBags.it  This test is no t yet approved or cleared by the [place] FDA and  has been authorized for detection and/or diagnosis of SARS-CoV-2 by FDA under an Emergency Use Authorization (EUA). This EUA will remain  in effect (meaning this test can be used) for the duration of the COVID-19 declaration under Section 564(b)(1) of the Act,  21 U.S.C. section 360bbb-3(b)(1), unless the authorization is terminated or revoked sooner.     Influenza A by PCR NEGATIVE NEGATIVE Final   Influenza B by PCR NEGATIVE NEGATIVE Final    Comment: (NOTE) The Xpert Xpress SARS-CoV-2/FLU/RSV assay is intended  as an aid in  the diagnosis of influenza from Nasopharyngeal swab specimens and  should not be used as a sole basis for treatment. Nasal washings and  aspirates are unacceptable for Xpert Xpress SARS-CoV-2/FLU/RSV  testing.  Fact Sheet for Patients: PinkCheek.be  Fact Sheet for Healthcare Providers: GravelBags.it  This test is not yet approved or cleared by the [place] FDA and  has been authorized for detection and/or diagnosis of SARS-CoV-2 by  FDA under an Emergency Use Authorization (EUA). This EUA will remain  in effect (meaning this test can be used) for the duration of the  Covid-19 declaration under Section 564(b)(1) of the Act, 21  U.S.C. section 360bbb-3(b)(1), unless the authorization is  terminated or revoked. Performed at [facility], [address] [place], [place] [address]   CULTURE, BLOOD (ROUTINE X 2) w Reflex to ID Panel     Status: None   Collection Time: 10/02/20  1:16 PM   Specimen: BLOOD  Result Value Ref Range Status   Specimen Description BLOOD LEFT ANTECUBITAL  Final   Special Requests   Final    BOTTLES DRAWN AEROBIC AND ANAEROBIC Blood Culture adequate volume   Culture   Final    NO GROWTH 5 DAYS Performed at [facility], [address] [place], [place] [address]    Report Status 10/07/2020 FINAL  Final  CULTURE, BLOOD (ROUTINE X 2) w Reflex to ID Panel     Status: None   Collection Time: 10/02/20  1:33 PM   Specimen: BLOOD  Result Value Ref Range Status   Specimen Description BLOOD BLOOD LEFT HAND  Final   Special Requests   Final    BOTTLES DRAWN AEROBIC AND ANAEROBIC Blood Culture adequate volume    Culture   Final    NO GROWTH 5 DAYS Performed at [facility], [address] [place], [place] [address]    Report Status 10/07/2020 FINAL  Final     Studies: DG Chest Port 1 View  Result Date: 10/07/2020 CLINICAL DATA:  Hypoxia. EXAM: PORTABLE CHEST 1 VIEW COMPARISON:  October 02, 2020 FINDINGS: Very mild, chronic appearing increased bronchovascular lung markings are seen. Very mild residual left basilar atelectasis and/or infiltrate is noted. There is no evidence of a pleural effusion or pneumothorax. The cardiac silhouette is mildly enlarged and unchanged in size. Degenerative changes seen throughout the thoracic spine. IMPRESSION: Very mild residual left basilar atelectasis and/or infiltrate. Electronically Signed   By: Virgina Norfolk M.D.   On: 10/07/2020 16:28      Flora Lipps, MD  [facility] 10/09/2020

## 2020-10-09 NOTE — Progress Notes (Signed)
[facility]  ROUNDING NOTE   Subjective:   [name] is 74 years old male with past medical history of diabetes, hypertension, GERD, renal cell carcinoma post left nephrectomy in 2004, COPD, pancreatic insufficiency, CKD stage III , anemia of CKD sent in to the ED by his PCP for abnormal renal function test and progressively worsening bilateral lower extremity weakness. Patient's baseline creatinine is 2.2/GFR 30 from 06/26/2020  Patient receiving dialysis treatment today,tolerating well    HEMODIALYSIS FLOWSHEET:  Blood Flow Rate (mL/min): 250 mL/min Arterial Pressure (mmHg): -100 mmHg Venous Pressure (mmHg): 90 mmHg Transmembrane Pressure (mmHg): 60 mmHg Ultrafiltration Rate (mL/min): 400 mL/min Dialysate Flow Rate (mL/min): 500 ml/min Conductivity: Machine : 13.8 Conductivity: Machine : 13.8 Dialysis Fluid Bolus: Normal Saline Bolus Amount (mL): 250 mL   Objective:  Vital signs in last 24 hours:  Temp:  [97.9 F (36.6 C)-98.9 F (37.2 C)] 98.9 F (37.2 C) (10/26 1007) Pulse Rate:  [54-63] 63 (10/26 0807) Resp:  [18-20] 19 (10/26 0807) BP: (122-165)/(61-80) 122/64 (10/26 1007) SpO2:  [93 %-100 %] 99 % (10/26 1145)  Weight change:  Filed Weights   10/06/20 0318 10/07/20 0315 10/08/20 0626  Weight: 115.3 kg 111.9 kg 116.8 kg    Intake/Output: I/O last 3 completed shifts: In: 480 [P.O.:480] Out: 1625 [Urine:1625]   Intake/Output this shift:  Total I/O In: -  Out: 500 [Urine:500]  Physical Exam: General:  Resting in bed, receiving dialysis treatment  Head: Moist oral mucosal membranes  Eyes: Anicteric  Lungs:   Respirations symmetrical,unlabored,Lungs diminished at the bases  Heart: S1S2,No rubs or gallops,regular  Abdomen:  Soft, nontender, non distended  Extremities:  No peripheral edema.  Neurologic:  Oriented x 3   Skin: No acute lesions or rashes  RT.Femoral Catheter  Basic Metabolic Panel: Recent Labs  Lab 10/03/20 0547  10/03/20 0547 10/04/20 0530 10/04/20 0530 10/05/20 0600 10/05/20 0600 10/06/20 0500 10/06/20 0500 10/07/20 0529 10/08/20 0631 10/09/20 0445  NA 141   < > 141   < > 138  --  143  --  141 142 139  K 3.3*   < > 3.1*   < > 3.1*  --  3.6  --  3.5 4.8 4.1  CL 107   < > 105   < > 100  --  106  --  105 105 106  CO2 18*   < > 21*   < > 26  --  26  --  27 25 23   GLUCOSE 103*   < > 117*   < > 152*  --  167*  --  144* 203* 211*  BUN 97*   < > 83*   < > 48*  --  59*  --  41* 56* 69*  CREATININE 7.20*   < > 6.80*   < > 5.05*  --  6.20*  --  4.70* 5.84* 6.71*  CALCIUM 6.3*   < > 6.5*   < > 7.2*   < > 7.2*   < > 8.0* 8.6* 8.1*  MG 1.5*   < > 1.6*   < > 1.9  --  2.0  --  2.0 2.2 2.2  PHOS 7.9*  --  8.2*  --  5.6*  --  6.3*  --  5.0*  --   --    < > = values in this interval not displayed.    Liver Function Tests: Recent Labs  Lab 10/03/20 0547  ALBUMIN 2.8*   No results  for input(s): LIPASE, AMYLASE in the last 168 hours. No results for input(s): AMMONIA in the last 168 hours.  CBC: Recent Labs  Lab 10/05/20 0600 10/05/20 0600 10/06/20 0500 10/06/20 2135 10/07/20 0529 10/08/20 0631 10/09/20 0445  WBC 6.0  --  5.1  --  5.6 7.1 7.5  NEUTROABS 4.6  --   --   --   --   --   --   HGB 7.0*   < > 6.5* 8.0* 7.8* 8.4* 8.1*  HCT 21.4*   < > 20.9* 24.7* 24.5* 26.3* 25.5*  MCV 96.4  --  97.2  --  95.3 97.8 97.3  PLT 158  --  169  --  162 195 190   < > = values in this interval not displayed.    Cardiac Enzymes: No results for input(s): CKTOTAL, CKMB, CKMBINDEX, TROPONINI in the last 168 hours.  BNP: Invalid input(s): POCBNP  CBG: Recent Labs  Lab 10/08/20 0753 10/08/20 1145 10/08/20 1639 10/08/20 2134 10/09/20 0805  GLUCAP 176* 191* 178* 222* 163*    Microbiology: Results for orders placed or performed during the hospital encounter of 09/30/20  Respiratory Panel by RT PCR (Flu A&B, Covid) - Nasopharyngeal Swab     Status: None   Collection Time: 09/30/20  9:07 PM    Specimen: Nasopharyngeal Swab  Result Value Ref Range Status   SARS Coronavirus 2 by RT PCR NEGATIVE NEGATIVE Final    Comment: (NOTE) SARS-CoV-2 target nucleic acids are NOT DETECTED.  The SARS-CoV-2 RNA is generally detectable in upper respiratoy specimens during the acute phase of infection. The lowest concentration of SARS-CoV-2 viral copies this assay can detect is 131 copies/mL. A negative result does not preclude SARS-Cov-2 infection and should not be used as the sole basis for treatment or other patient management decisions. A negative result may occur with  improper specimen collection/handling, submission of specimen other than nasopharyngeal swab, presence of viral mutation(s) within the areas targeted by this assay, and inadequate number of viral copies (<131 copies/mL). A negative result must be combined with clinical observations, patient history, and epidemiological information. The expected result is Negative.  Fact Sheet for Patients:  PinkCheek.be  Fact Sheet for Healthcare Providers:  GravelBags.it  This test is no t yet approved or cleared by the [place] FDA and  has been authorized for detection and/or diagnosis of SARS-CoV-2 by FDA under an Emergency Use Authorization (EUA). This EUA will remain  in effect (meaning this test can be used) for the duration of the COVID-19 declaration under Section 564(b)(1) of the Act, 21 U.S.C. section 360bbb-3(b)(1), unless the authorization is terminated or revoked sooner.     Influenza A by PCR NEGATIVE NEGATIVE Final   Influenza B by PCR NEGATIVE NEGATIVE Final    Comment: (NOTE) The Xpert Xpress SARS-CoV-2/FLU/RSV assay is intended as an aid in  the diagnosis of influenza from Nasopharyngeal swab specimens and  should not be used as a sole basis for treatment. Nasal washings and  aspirates are unacceptable for Xpert Xpress SARS-CoV-2/FLU/RSV   testing.  Fact Sheet for Patients: PinkCheek.be  Fact Sheet for Healthcare Providers: GravelBags.it  This test is not yet approved or cleared by the [place] FDA and  has been authorized for detection and/or diagnosis of SARS-CoV-2 by  FDA under an Emergency Use Authorization (EUA). This EUA will remain  in effect (meaning this test can be used) for the duration of the  Covid-19 declaration under Section 564(b)(1) of the Act, 21  U.S.C. section 360bbb-3(b)(1), unless the authorization is  terminated or revoked. Performed at [facility], [address] [place], [place] [address]   CULTURE, BLOOD (ROUTINE X 2) w Reflex to ID Panel     Status: None   Collection Time: 10/02/20  1:16 PM   Specimen: BLOOD  Result Value Ref Range Status   Specimen Description BLOOD LEFT ANTECUBITAL  Final   Special Requests   Final    BOTTLES DRAWN AEROBIC AND ANAEROBIC Blood Culture adequate volume   Culture   Final    NO GROWTH 5 DAYS Performed at [facility], [address] [place], [place] [address]    Report Status 10/07/2020 FINAL  Final  CULTURE, BLOOD (ROUTINE X 2) w Reflex to ID Panel     Status: None   Collection Time: 10/02/20  1:33 PM   Specimen: BLOOD  Result Value Ref Range Status   Specimen Description BLOOD BLOOD LEFT HAND  Final   Special Requests   Final    BOTTLES DRAWN AEROBIC AND ANAEROBIC Blood Culture adequate volume   Culture   Final    NO GROWTH 5 DAYS Performed at [facility], [address], [place], [place] [address]    Report Status 10/07/2020 FINAL  Final    Coagulation Studies: No results for input(s): LABPROT, INR in the last 72 hours.  Urinalysis: No results for input(s): COLORURINE, LABSPEC, PHURINE, GLUCOSEU, HGBUR, BILIRUBINUR, KETONESUR, PROTEINUR, UROBILINOGEN, NITRITE, LEUKOCYTESUR in the last 72 hours.  Invalid input(s): APPERANCEUR    Imaging: DG  Chest Port 1 View  Result Date: 10/07/2020 CLINICAL DATA:  Hypoxia. EXAM: PORTABLE CHEST 1 VIEW COMPARISON:  October 02, 2020 FINDINGS: Very mild, chronic appearing increased bronchovascular lung markings are seen. Very mild residual left basilar atelectasis and/or infiltrate is noted. There is no evidence of a pleural effusion or pneumothorax. The cardiac silhouette is mildly enlarged and unchanged in size. Degenerative changes seen throughout the thoracic spine. IMPRESSION: Very mild residual left basilar atelectasis and/or infiltrate. Electronically Signed   By: Virgina Norfolk M.D.   On: 10/07/2020 16:28     Medications:    . apixaban  2.5 mg Oral BID  . carvedilol  6.25 mg Oral BID WC  . Chlorhexidine Gluconate Cloth  6 each Topical Q0600  . DULoxetine  30 mg Oral Daily  . feeding supplement (NEPRO CARB STEADY)  237 mL Oral BID BM  . gabapentin  100 mg Oral QHS  . guaiFENesin  5 mL Oral TID  . insulin aspart  0-6 Units Subcutaneous TID WC  . lipase/protease/amylase  72,000 Units Oral TID WC  . mirtazapine  30 mg Oral QHS  . oxymetazoline  1 spray Each Nare BID  . pantoprazole  40 mg Oral BID  . rOPINIRole  4 mg Oral BID AC  . sodium chloride flush  3 mL Intravenous Q12H  . sucralfate  1 g Oral TID  . thiamine  100 mg Oral Daily   acetaminophen **OR** acetaminophen, albuterol, clonazePAM, ondansetron **OR** ondansetron (ZOFRAN) IV, sodium chloride flush  Assessment/ Plan:  Mr. Keiland Pickering is a 74 y.o.  male Mr. Teters is 74 years old male with past medical history of diabetes, hypertension, GERD, renal cell carcinoma post left nephrectomy in 2004, COPD, pancreatic insufficiency, CKD stage III , anemia of CKD sent in to the ED by his PCP for abnormal renal function test and progressively worsening bilateral lower extremity weakness.  # Acute kidney injury on CKD stage  IIIa #Metabolic acidosis -Patient  has history of renal cell carcinoma with left nephrectomy in  2004 Patient's baseline creatinine is 2.2/GFR 30 from 06/26/2020 Lab Results  Component Value Date   CREATININE 6.71 (H) 10/09/2020   CREATININE 5.84 (H) 10/08/2020   CREATININE 4.70 (H) 10/07/2020   -Urine Output for the preceding 24 hours is 1625  ml -Receiving dialysis today - Plan for Permocath placement today,vascular team aware.Patient is NPO  #Anemia of CKD Lab Results  Component Value Date   HGB 8.1 (L) 10/09/2020   Will start Epogen with dialysis   # Secondary Hyperparathyroidism Calcium 8.1 Phosphorus 5.0 on 10/07/20 Will continue monitoring bone mineral metabolism parameters      LOS: 9 Evaleen Sant 10/26/202111:57 AM

## 2020-10-10 DIAGNOSIS — N185 Chronic kidney disease, stage 5: Secondary | ICD-10-CM

## 2020-10-10 LAB — COMPREHENSIVE METABOLIC PANEL
ALT: 16 U/L (ref 0–44)
AST: 16 U/L (ref 15–41)
Albumin: 3.1 g/dL — ABNORMAL LOW (ref 3.5–5.0)
Alkaline Phosphatase: 98 U/L (ref 38–126)
Anion gap: 12 (ref 5–15)
BUN: 51 mg/dL — ABNORMAL HIGH (ref 8–23)
CO2: 25 mmol/L (ref 22–32)
Calcium: 8.5 mg/dL — ABNORMAL LOW (ref 8.9–10.3)
Chloride: 102 mmol/L (ref 98–111)
Creatinine, Ser: 5.28 mg/dL — ABNORMAL HIGH (ref 0.61–1.24)
GFR, Estimated: 11 mL/min — ABNORMAL LOW (ref >60)
Glucose, Bld: 195 mg/dL — ABNORMAL HIGH (ref 70–99)
Potassium: 3.7 mmol/L (ref 3.5–5.1)
Sodium: 139 mmol/L (ref 135–145)
Total Bilirubin: 0.7 mg/dL (ref 0.3–1.2)
Total Protein: 6.3 g/dL — ABNORMAL LOW (ref 6.5–8.1)

## 2020-10-10 LAB — COPPER, SERUM: Copper: 105 ug/dL (ref 69–132)

## 2020-10-10 LAB — CBC
HCT: 26.3 % — ABNORMAL LOW (ref 39.0–52.0)
Hemoglobin: 8.3 g/dL — ABNORMAL LOW (ref 13.0–17.0)
MCH: 30.3 pg (ref 26.0–34.0)
MCHC: 31.6 g/dL (ref 30.0–36.0)
MCV: 96 fL (ref 80.0–100.0)
Platelets: 208 10*3/uL (ref 150–400)
RBC: 2.74 MIL/uL — ABNORMAL LOW (ref 4.22–5.81)
RDW: 14.5 % (ref 11.5–15.5)
WBC: 7.8 10*3/uL (ref 4.0–10.5)
nRBC: 0 % (ref 0.0–0.2)

## 2020-10-10 LAB — PHOSPHORUS: Phosphorus: 3.2 mg/dL (ref 2.5–4.6)

## 2020-10-10 LAB — GLUCOSE, CAPILLARY
Glucose-Capillary: 147 mg/dL — ABNORMAL HIGH (ref 70–99)
Glucose-Capillary: 167 mg/dL — ABNORMAL HIGH (ref 70–99)
Glucose-Capillary: 190 mg/dL — ABNORMAL HIGH (ref 70–99)

## 2020-10-10 LAB — MAGNESIUM: Magnesium: 2 mg/dL (ref 1.7–2.4)

## 2020-10-10 NOTE — Progress Notes (Signed)
Mobility Specialist - Progress Note   10/10/20 1125  Mobility  Activity Contraindicated/medical hold  Mobility performed by Mobility specialist    Pt currently out of room for dialysis. Will hold and re-attempt when pt is available and medically appropriate.     Weylyn Ricciuti Mobility Specialist  10/10/20, 11:26 AM

## 2020-10-10 NOTE — Op Note (Signed)
There is I thought it was a redo [place] [facility] AND IT IS ACTUALLY SURGERY   OPERATIVE NOTE     PROCEDURE: 1. Insertion of a right IJ tunneled dialysis catheter. 2. Catheter placement and cannulation under ultrasound and fluoroscopic guidance  PRE-OPERATIVE DIAGNOSIS: end-stage renal requiring hemodialysis  POST-OPERATIVE DIAGNOSIS: same as above  SURGEON: Hortencia Pilar  ANESTHESIA: Conscious sedation was administered under my direct supervision by the interventional radiology RN. IV Versed plus fentanyl were utilized. Continuous ECG, pulse oximetry and blood pressure was monitored throughout the entire procedure.  Conscious sedation was for a total of 41 minutes and 12 seconds.  ESTIMATED BLOOD LOSS: Minimal  FINDING(S): 1.  Tips of the catheter in the right atrium on fluoroscopy 2.  No obvious pneumothorax on fluoroscopy  SPECIMEN(S):  none  INDICATIONS:   [name] is a 74 y.o. male  presents with end stage renal disease.  Therefore, the patient requires a tunneled dialysis catheter placement.  The patient is informed of  the risks catheter placement include but are not limited to: bleeding, infection, central venous injury, pneumothorax, possible venous stenosis, possible malpositioning in the venous system, and possible infections related to long-term catheter presence.  The patient was aware of these risks and agreed to proceed.  DESCRIPTION: The patient was taken back to Special Procedure suite.  Prior to sedation, the patient was given IV antibiotics.  After obtaining adequate sedation, the patient was prepped and draped in the standard fashion for a right IJ tunneled dialysis catheter placement.  Appropriate Time Out is called.     The the right neck and chest wall are then infiltrated with 1% Lidocaine with epinepherine.  A 19 cm tip to cuff catheter is then selected, opened on the back table and prepped. Ultrasound is placed in a sterile sleeve.  Under  ultrasound guidance, the right internal jugular vein is examined and is noted to be echolucent and easily compressible indicating patency.   An image is recorded for the permanent record.  The right internal jugular vein is cannulated with the microneedle under direct ultrasound vissualization.  A Microwire followed by a micro sheath is then inserted without difficulty.   A J-wire was then advanced under fluoroscopic guidance into the inferior vena cava and the wire was secured.  Small counter incision was then made at the wire insertion site. A small pocket was fashioned with blunt dissection to allow easier passage of the cuff.  After approximating the catheter to the chest wall and an exit site is selected.  Small incision was made at the exit site and the catheter was pulled through the subcutaneous tunnel from the exit site to the neck insertion site.  The dilator and peel-away sheath are then advanced over the wire under fluoroscopic guidance. The catheters and advanced through the peel-away sheath.     Each port was tested by aspirating and flushing.  No resistance was noted.  Each port was then thoroughly flushed with heparinized saline.  The catheter was secured in placed with two interrupted stitches of 0 silk tied to the catheter.  The counter incision was closed with a U-stitch of 4-0 Monocryl.  The insertion site is then cleaned and sterile bandages applied including a Biopatch.  Each port was then packed with concentrated heparin (1000 Units/mL) at the manufacturer recommended volumes to each port.  Sterile caps were applied to each port.  On completion fluoroscopy, the tips of the catheter were in the right atrium, and there was  no evidence of pneumothorax.  COMPLICATIONS: None  CONDITION: Unchanged   Hortencia Pilar  vein and vascular Office: [phone]   10/10/2020, 8:02 AM

## 2020-10-10 NOTE — TOC Progression Note (Signed)
Transition of Care [facility]) - Progression Note    Patient Details  Name: [name] MRN: [id number] Date of Birth: 03-03-1946  Transition of Care [facility]) CM/SW [place], LCSW Phone Number: 10/10/2020, 6:26 PM  Clinical Narrative:    Patient has chair appointment for dialysis TTS at 11:15. [place] is aware. Patient has bed and can go as soon as medically stable.        Expected Discharge Plan and Services                                                 Social Determinants of Health (SDOH) Interventions    Readmission Risk Interventions Readmission Risk Prevention Plan 10/08/2020 07/08/2019  Transportation Screening Complete Complete  PCP or Specialist Appt within 3-5 Days Complete Complete  HRI or [place] Complete Complete  Social Work Consult for [place] Planning/Counseling Complete Complete  Palliative Care Screening Not Applicable Not Applicable  Medication Review [employer]) Complete Complete  Some recent data might be hidden

## 2020-10-10 NOTE — Progress Notes (Signed)
New hemodialysis patient needing outpatient clinic set up. Patient is clinically accepted at [facility] TTS 11:15am, if medically stable, can start on Thursday 10/28 at 10:45am. Please contact me when patient is ready for discharge to coordinate start date with clinic.  Elvera Bicker Dialysis Coordinator [phone]

## 2020-10-10 NOTE — Progress Notes (Signed)
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 He was referred to the emergency room by his PCP because of lower extremity weakness and acute kidney injury. He was found to have acute kidney injury complicated by hyperkalemia, metabolic acidosis, uremic encephalopathy, myoclonic jerks, hyperphosphatemia and hypocalcemia. He was treated with IV sodium bicarbonate infusion. Nephrologist was consulted to assist with management.  Patient did have worsening kidney function with fluid overload and hypoxic respiratory failure.  Vascular surgery was consulted for a temporary dialysis catheter placement.  Patient underwent hemodialysis with improvement in his metabolic encephalopathy.    Consultants  . Nephrology . Vascular surgery  Procedures  . Temporary dialysis catheter placement . Dialysis  Antibiotics   Anti-infectives (From admission, onward)   Start     Dose/Rate Route Frequency Ordered Stop   10/09/20 1341  ceFAZolin (ANCEF) 1-4 GM/50ML-% IVPB       Note to Pharmacy: [name]   : cabinet override      10/09/20 1341 10/09/20 1421   10/02/20 1300  cefTRIAXone (ROCEPHIN) 1 g in sodium chloride 0.9 % 100 mL IVPB  Status:  Discontinued        1 g 200 mL/hr over 30 Minutes Intravenous Every 24 hours 10/02/20 1204 10/05/20 1104    .  Subjective  The patient is resting comfortably. He is complaining of worsening of his hearing loss since admission. His wife confirms that he has significant hearing loss at home as well.  Objective    Vitals:  Vitals:   10/10/20 1320 10/10/20 1615  BP: 126/71 (!) 144/80  Pulse:  84  Resp:  20  Temp:  97.9 F (36.6 C)  SpO2: 100% 95%    Exam:  Constitutional:  . Appears calm and comfortable Respiratory:  . CTA bilaterally, no w/r/r.  . Respiratory effort normal. No retractions or accessory muscle use Cardiovascular:  . RRR, no m/r/g . No LE extremity edema   . Normal pedal pulses Abdomen:  . Abdomen appears normal; no tenderness or masses . No hernias . No HSM Musculoskeletal:  . Digits/nails BUE: no clubbing, cyanosis, petechiae, infection . exam of joints, bones, muscles of at least one of following: head/neck, RUE, LUE, RLE, LLE   o strength and tone normal, no atrophy, no abnormal movements o No tenderness, masses o Normal ROM, no contractures  . gait and station Skin:  . No rashes, lesions, ulcers . palpation of skin: no induration or nodules Neurologic:  . CN 2-12 intact . Sensation all 4 extremities intact Psychiatric:  . Mental status o Mood, affect appropriate o Orientation to person, place, time  . judgment and insight appear intact    I have personally reviewed the following:   Today's Data  . Vitals, CMP, CBC  Micro Data  . Blood cultures x 2: No growth  Imaging  . CT head without contrast. . Portable chest x-ray  Scheduled Meds: . apixaban  2.5 mg Oral BID  . carvedilol  6.25 mg Oral BID WC  . Chlorhexidine Gluconate Cloth  6 each Topical Q0600  . DULoxetine  30 mg Oral Daily  . [START ON 10/11/2020] epoetin (EPOGEN/PROCRIT) injection  4,000 Units Intravenous Q T,Th,Sa-HD  . feeding supplement (NEPRO CARB STEADY)  237 mL Oral BID BM  . gabapentin  100 mg Oral QHS  . guaiFENesin  5 mL Oral TID  . insulin aspart  0-6 Units Subcutaneous TID WC  . lipase/protease/amylase  72,000 Units Oral TID WC  . mirtazapine  30 mg Oral QHS  . pantoprazole  40 mg Oral BID  . rOPINIRole  4 mg Oral BID AC  . sodium chloride flush  3 mL Intravenous  Q12H  . sucralfate  1 g Oral TID  . thiamine  100 mg Oral Daily   Continuous Infusions:  Principal Problem:   AKI (acute kidney injury) ([place]) Active Problems:   Essential hypertension   Type II diabetes mellitus with renal manifestations (HCC)   Anemia in chronic kidney disease   Spinal stenosis, lumbar region, with neurogenic claudication   Hyperkalemia   COPD (chronic obstructive pulmonary disease) ([place])   CKD stage 3 due to type 2 diabetes mellitus ([place])   History of renal cell carcinoma   History of left nephrectomy 2004   Hypoglycemia   Pancreatic insufficiency   Chronic diarrhea   Metabolic acidosis   LOS: 10 days   A & P  AKI on CKD stage IIIb:  History of left nephrectomy for renal cell cancer.  Currently receiving the intermittent hemodialysis.  Patient presented with severe uremic symptoms, severe metabolic acidosis, hyperkalemia .  His symptoms have largely improved after initiation of hemodialysis.  Nephrology on board and plan is placement of permacath.  Vascular surgery has been notified.    Acute metabolic encephalopathy/uremic encephalopathy: Improving.  Continue hemodialysis.  Medications have been gradually restarted.  On oral thiamine as well.   Acute hypoxemic respiratory failure: He is currently saturating 95% on room air.  Oxygen has been weaned down to room air from 5 L..  Chest x-ray from 10/07/2020 with mild left basilar atelectasis.  Patient was encouraged incentive as permitted deep breathing.  Hemodialysis for volume management.  Hyperkalemia: Resolved with hemodialysis.    Hypomagnesemia: Resolved.  Essential hypertension: Continue carvedilol.  Blood pressure was largely controlled but slightly higher today.  Continue to monitor.  History of DVT and PE: Continue Eliquis renal dosing.  Would benefit from same dosing on discharge.  Type II DM with recurrent hypoglycemia on presentation: Off glipizide.  Improving hypoglycemia after dialysis.   Latest POC glucose of 163.Marland Kitchen  Patient has been started on sensitive scale sliding scale insulin since yesterday.  Anemia of chronic kidney disease: Received 1 unit of packed RBC for hemoglobin of 6.5.  Hemoglobin of 8.1 today..  EPO/iron as recommended per nephrology.  Serum iron of 44, TIBC 183.  Ferritin of 182.  Iron saturation at 24%.    Chronic pancreatitis/chronic diarrhea: Continue Creon    COPD: As needed bronchodilators.  compensated.   Chronic back pain: Hold off with narcotics for now.   Depression: Has been resumed Remeron to 30 mg at nighttime.  Will resume Cymbalta, but start at a lower dose due to renal insufficiency.  Debility, weakness:  Awaiting for skilled nursing facility placement.  I have seen and examined this patient myself. I have spent 32 minutes in his evaluation and care.  DVT prophylaxis: apixaban (ELIQUIS) tablet 2.5 mg Start: 10/02/20 1000 apixaban (ELIQUIS) tablet 2.5 mg  Code Status: Full code Family Communication: Spoke with the patient's wife at bedside and updated her about the clinical condition of the patient. Disposition:  Status is: Inpatient  Remains inpatient appropriate because:Persistent severe electrolyte disturbances, Unsafe d/c plan, IV treatments appropriate due to intensity of illness or inability to take PO, Inpatient level of care appropriate due to severity of illness and Need for hemodialysis, need for permacath placement and outpatient hemodialysis.  Dispo: The patient is from: Home  Anticipated d/c is to: skilled nursing facility placement on discharge.  Anticipated d/c date is: 2 to 3 days, will need outpatient hemodialysis set up, pending permacath placement today,  Patient currently is not medically stable to d/c.   Curtis Reinecke, DO [facility] Direct contact: see [email or web address]  7PM-7AM contact night coverage as above 10/10/2020, 5:59 PM  LOS: 10 days

## 2020-10-10 NOTE — Progress Notes (Signed)
[facility]  ROUNDING NOTE   Subjective:   [name] is 74 years old male with past medical history of diabetes, hypertension, GERD, renal cell carcinoma post left nephrectomy in 2004, COPD, pancreatic insufficiency, CKD stage III , anemia of CKD sent in to the ED by his PCP for abnormal renal function test and progressively worsening bilateral lower extremity weakness. Patient's baseline creatinine is 2.2/GFR 30 from 06/26/2020  Patient appears pleasant and comfortable today,receiving dialysis treatment, tolerating treatment well. No complaints of shortness of breath, nausea or vomiting.    HEMODIALYSIS FLOWSHEET:  Blood Flow Rate (mL/min): 400 mL/min Arterial Pressure (mmHg): 50 mmHg Venous Pressure (mmHg): 130 mmHg Transmembrane Pressure (mmHg): 60 mmHg Ultrafiltration Rate (mL/min): 500 mL/min Dialysate Flow Rate (mL/min): 600 ml/min Conductivity: Machine : 13.8 Conductivity: Machine : 13.8 Dialysis Fluid Bolus: Normal Saline Bolus Amount (mL): 200 mL   Objective:  Vital signs in last 24 hours:  Temp:  [97.5 F (36.4 C)-99.4 F (37.4 C)] 98.6 F (37 C) (10/27 1000) Pulse Rate:  [61-74] 70 (10/27 1020) Resp:  [10-26] 22 (10/27 1020) BP: (118-160)/(65-83) 135/71 (10/27 1020) SpO2:  [92 %-100 %] 92 % (10/27 1100) Weight:  [113.2 kg] 113.2 kg (10/27 0402)  Weight change:  Filed Weights   10/07/20 0315 10/08/20 0626 10/10/20 0402  Weight: 111.9 kg 116.8 kg 113.2 kg    Intake/Output: I/O last 3 completed shifts: In: -  Out: 2300 [Urine:1800; Other:500]   Intake/Output this shift:  No intake/output data recorded.  Physical Exam: General:  No acute distress, receiving dialysis treatment  Head:  Normocephalic, atraumatic moist oral mucosal membranes  Eyes: Anicteric  Lungs:   Lungs clear bilaterally  Heart:  Regular rate and rhythm, heart rate in 70s  Abdomen:  Soft, nontender  Extremities:  No peripheral edema.  Neurologic:  Alert, awake, speech  clear and appropriate  Skin: No acute lesions or rashes  Right IJ permacath  Basic Metabolic Panel: Recent Labs  Lab 10/04/20 0530 10/04/20 0530 10/05/20 0600 10/05/20 0600 10/06/20 0500 10/06/20 0500 10/07/20 0529 10/07/20 0529 10/08/20 0631 10/09/20 0445 10/10/20 0500 10/10/20 1106  NA 141   < > 138   < > 143  --  141  --  142 139 139  --   K 3.1*   < > 3.1*   < > 3.6  --  3.5  --  4.8 4.1 3.7  --   CL 105   < > 100   < > 106  --  105  --  105 106 102  --   CO2 21*   < > 26   < > 26  --  27  --  25 23 25   --   GLUCOSE 117*   < > 152*   < > 167*  --  144*  --  203* 211* 195*  --   BUN 83*   < > 48*   < > 59*  --  41*  --  56* 69* 51*  --   CREATININE 6.80*   < > 5.05*   < > 6.20*  --  4.70*  --  5.84* 6.71* 5.28*  --   CALCIUM 6.5*   < > 7.2*   < > 7.2*   < > 8.0*   < > 8.6* 8.1* 8.5*  --   MG 1.6*   < > 1.9   < > 2.0  --  2.0  --  2.2 2.2 2.0  --  PHOS 8.2*  --  5.6*  --  6.3*  --  5.0*  --   --   --   --  3.2   < > = values in this interval not displayed.    Liver Function Tests: Recent Labs  Lab 10/10/20 0500  AST 16  ALT 16  ALKPHOS 98  BILITOT 0.7  PROT 6.3*  ALBUMIN 3.1*   No results for input(s): LIPASE, AMYLASE in the last 168 hours. No results for input(s): AMMONIA in the last 168 hours.  CBC: Recent Labs  Lab 10/05/20 0600 10/05/20 0600 10/06/20 0500 10/06/20 0500 10/06/20 2135 10/07/20 0529 10/08/20 0631 10/09/20 0445 10/10/20 0500  WBC 6.0   < > 5.1  --   --  5.6 7.1 7.5 7.8  NEUTROABS 4.6  --   --   --   --   --   --   --   --   HGB 7.0*   < > 6.5*   < > 8.0* 7.8* 8.4* 8.1* 8.3*  HCT 21.4*   < > 20.9*   < > 24.7* 24.5* 26.3* 25.5* 26.3*  MCV 96.4   < > 97.2  --   --  95.3 97.8 97.3 96.0  PLT 158   < > 169  --   --  162 195 190 208   < > = values in this interval not displayed.    Cardiac Enzymes: No results for input(s): CKTOTAL, CKMB, CKMBINDEX, TROPONINI in the last 168 hours.  BNP: Invalid input(s): POCBNP  CBG: Recent Labs   Lab 10/09/20 0805 10/09/20 1326 10/09/20 1602 10/09/20 2119 10/10/20 0806  GLUCAP 163* 129* 123* 228* 147*    Microbiology: Results for orders placed or performed during the hospital encounter of 09/30/20  Respiratory Panel by RT PCR (Flu A&B, Covid) - Nasopharyngeal Swab     Status: None   Collection Time: 09/30/20  9:07 PM   Specimen: Nasopharyngeal Swab  Result Value Ref Range Status   SARS Coronavirus 2 by RT PCR NEGATIVE NEGATIVE Final    Comment: (NOTE) SARS-CoV-2 target nucleic acids are NOT DETECTED.  The SARS-CoV-2 RNA is generally detectable in upper respiratoy specimens during the acute phase of infection. The lowest concentration of SARS-CoV-2 viral copies this assay can detect is 131 copies/mL. A negative result does not preclude SARS-Cov-2 infection and should not be used as the sole basis for treatment or other patient management decisions. A negative result may occur with  improper specimen collection/handling, submission of specimen other than nasopharyngeal swab, presence of viral mutation(s) within the areas targeted by this assay, and inadequate number of viral copies (<131 copies/mL). A negative result must be combined with clinical observations, patient history, and epidemiological information. The expected result is Negative.  Fact Sheet for Patients:  PinkCheek.be  Fact Sheet for Healthcare Providers:  GravelBags.it  This test is no t yet approved or cleared by the [place] FDA and  has been authorized for detection and/or diagnosis of SARS-CoV-2 by FDA under an Emergency Use Authorization (EUA). This EUA will remain  in effect (meaning this test can be used) for the duration of the COVID-19 declaration under Section 564(b)(1) of the Act, 21 U.S.C. section 360bbb-3(b)(1), unless the authorization is terminated or revoked sooner.     Influenza A by PCR NEGATIVE NEGATIVE Final    Influenza B by PCR NEGATIVE NEGATIVE Final    Comment: (NOTE) The Xpert Xpress SARS-CoV-2/FLU/RSV assay is intended as an aid in  the  diagnosis of influenza from Nasopharyngeal swab specimens and  should not be used as a sole basis for treatment. Nasal washings and  aspirates are unacceptable for Xpert Xpress SARS-CoV-2/FLU/RSV  testing.  Fact Sheet for Patients: PinkCheek.be  Fact Sheet for Healthcare Providers: GravelBags.it  This test is not yet approved or cleared by the [place] FDA and  has been authorized for detection and/or diagnosis of SARS-CoV-2 by  FDA under an Emergency Use Authorization (EUA). This EUA will remain  in effect (meaning this test can be used) for the duration of the  Covid-19 declaration under Section 564(b)(1) of the Act, 21  U.S.C. section 360bbb-3(b)(1), unless the authorization is  terminated or revoked. Performed at [facility], [address] [place], [place] [address]   CULTURE, BLOOD (ROUTINE X 2) w Reflex to ID Panel     Status: None   Collection Time: 10/02/20  1:16 PM   Specimen: BLOOD  Result Value Ref Range Status   Specimen Description BLOOD LEFT ANTECUBITAL  Final   Special Requests   Final    BOTTLES DRAWN AEROBIC AND ANAEROBIC Blood Culture adequate volume   Culture   Final    NO GROWTH 5 DAYS Performed at [facility], [address] [place], [place] [address]    Report Status 10/07/2020 FINAL  Final  CULTURE, BLOOD (ROUTINE X 2) w Reflex to ID Panel     Status: None   Collection Time: 10/02/20  1:33 PM   Specimen: BLOOD  Result Value Ref Range Status   Specimen Description BLOOD BLOOD LEFT HAND  Final   Special Requests   Final    BOTTLES DRAWN AEROBIC AND ANAEROBIC Blood Culture adequate volume   Culture   Final    NO GROWTH 5 DAYS Performed at [facility], [address], [place], [place] [address]    Report Status 10/07/2020  FINAL  Final    Coagulation Studies: No results for input(s): LABPROT, INR in the last 72 hours.  Urinalysis: No results for input(s): COLORURINE, LABSPEC, PHURINE, GLUCOSEU, HGBUR, BILIRUBINUR, KETONESUR, PROTEINUR, UROBILINOGEN, NITRITE, LEUKOCYTESUR in the last 72 hours.  Invalid input(s): APPERANCEUR    Imaging: PERIPHERAL VASCULAR CATHETERIZATION  Result Date: 10/09/2020 See Op Note    Medications:    . apixaban  2.5 mg Oral BID  . carvedilol  6.25 mg Oral BID WC  . Chlorhexidine Gluconate Cloth  6 each Topical Q0600  . DULoxetine  30 mg Oral Daily  . [START ON 10/11/2020] epoetin (EPOGEN/PROCRIT) injection  4,000 Units Intravenous Q T,Th,Sa-HD  . feeding supplement (NEPRO CARB STEADY)  237 mL Oral BID BM  . gabapentin  100 mg Oral QHS  . guaiFENesin  5 mL Oral TID  . insulin aspart  0-6 Units Subcutaneous TID WC  . lipase/protease/amylase  72,000 Units Oral TID WC  . mirtazapine  30 mg Oral QHS  . pantoprazole  40 mg Oral BID  . rOPINIRole  4 mg Oral BID AC  . sodium chloride flush  3 mL Intravenous Q12H  . sucralfate  1 g Oral TID  . thiamine  100 mg Oral Daily   acetaminophen **OR** acetaminophen, albuterol, clonazePAM, ondansetron **OR** ondansetron (ZOFRAN) IV, sodium chloride flush  Assessment/ Plan:  [name] is a 74 y.o.  male [name] is 74 years old male with past medical history of diabetes, hypertension, GERD, renal cell carcinoma post left nephrectomy in 2004, COPD, pancreatic insufficiency, CKD stage III , anemia of CKD  sent in to the ED by his PCP for abnormal renal function test and progressively worsening bilateral lower extremity weakness.  # Acute kidney injury on CKD stage IIIa #Metabolic acidosis -Patient  has history of renal cell carcinoma with left nephrectomy in 2004 Patient's baseline creatinine is 2.2/GFR 30 from 06/26/2020 Lab Results  Component Value Date   CREATININE 5.28 (H) 10/10/2020   CREATININE 6.71 (H)  10/09/2020   CREATININE 5.84 (H) 10/08/2020   -Patient has a new PermCath on his right IJ, placed by vascular team on 10/09/2020 -Patient is receiving dialysis today, tolerating treatment well -His urine output for the preceding 24 hours was 1800 mL -We will continue assessing his renal function daily -Outpatient dialysis arrangements in process  #Anemia of CKD Lab Results  Component Value Date   HGB 8.3 (L) 10/10/2020   Patient has Epogen ordered with dialysis  # Secondary Hyperparathyroidism Phosphorus 3.2 Calcium 8.5 No indication for Phos binders      LOS: 10 [name] 10/27/202112:16 PM

## 2020-10-10 NOTE — Progress Notes (Signed)
PT Cancellation Note  Patient Details Name: [name] MRN: [id number] DOB: 07-27-1946   Cancelled Treatment:    Reason Eval/Treat Not Completed: Patient at procedure or test/unavailable Pt is out of room for dialysis.  Hoping he will be able to have temp cath removed.  Will maintain on PT caseload and attempt to see when appropriate and available.   Kreg Shropshire, DPT 10/10/2020, 12:26 PM

## 2020-10-11 ENCOUNTER — Encounter: Payer: Self-pay | Admitting: Vascular Surgery

## 2020-10-11 DIAGNOSIS — E440 Moderate protein-calorie malnutrition: Secondary | ICD-10-CM | POA: Insufficient documentation

## 2020-10-11 LAB — BASIC METABOLIC PANEL
Anion gap: 10 (ref 5–15)
BUN: 37 mg/dL — ABNORMAL HIGH (ref 8–23)
CO2: 30 mmol/L (ref 22–32)
Calcium: 8.7 mg/dL — ABNORMAL LOW (ref 8.9–10.3)
Chloride: 100 mmol/L (ref 98–111)
Creatinine, Ser: 4.18 mg/dL — ABNORMAL HIGH (ref 0.61–1.24)
GFR, Estimated: 14 mL/min — ABNORMAL LOW (ref >60)
Glucose, Bld: 198 mg/dL — ABNORMAL HIGH (ref 70–99)
Potassium: 3.7 mmol/L (ref 3.5–5.1)
Sodium: 140 mmol/L (ref 135–145)

## 2020-10-11 LAB — CBC WITH DIFFERENTIAL/PLATELET
Abs Immature Granulocytes: 0.04 10*3/uL (ref 0.00–0.07)
Basophils Absolute: 0.1 10*3/uL (ref 0.0–0.1)
Basophils Relative: 1 %
Eosinophils Absolute: 0.2 10*3/uL (ref 0.0–0.5)
Eosinophils Relative: 4 %
HCT: 25.4 % — ABNORMAL LOW (ref 39.0–52.0)
Hemoglobin: 8.1 g/dL — ABNORMAL LOW (ref 13.0–17.0)
Immature Granulocytes: 1 %
Lymphocytes Relative: 17 %
Lymphs Abs: 0.9 10*3/uL (ref 0.7–4.0)
MCH: 30.8 pg (ref 26.0–34.0)
MCHC: 31.9 g/dL (ref 30.0–36.0)
MCV: 96.6 fL (ref 80.0–100.0)
Monocytes Absolute: 0.5 10*3/uL (ref 0.1–1.0)
Monocytes Relative: 9 %
Neutro Abs: 3.8 10*3/uL (ref 1.7–7.7)
Neutrophils Relative %: 68 %
Platelets: 184 10*3/uL (ref 150–400)
RBC: 2.63 MIL/uL — ABNORMAL LOW (ref 4.22–5.81)
RDW: 14.3 % (ref 11.5–15.5)
WBC: 5.5 10*3/uL (ref 4.0–10.5)
nRBC: 0 % (ref 0.0–0.2)

## 2020-10-11 LAB — PHOSPHORUS: Phosphorus: 4.9 mg/dL — ABNORMAL HIGH (ref 2.5–4.6)

## 2020-10-11 LAB — GLUCOSE, CAPILLARY
Glucose-Capillary: 157 mg/dL — ABNORMAL HIGH (ref 70–99)
Glucose-Capillary: 176 mg/dL — ABNORMAL HIGH (ref 70–99)
Glucose-Capillary: 218 mg/dL — ABNORMAL HIGH (ref 70–99)

## 2020-10-11 MED ORDER — OXYCODONE-ACETAMINOPHEN 5-325 MG PO TABS: 1.0000 | ORAL_TABLET | Freq: Three times a day (TID) | ORAL | Status: DC | PRN

## 2020-10-11 MED ORDER — RENA-VITE PO TABS
1.0000 | ORAL_TABLET | Freq: Every day | ORAL | Status: DC
  Administered 2020-10-11: 1 via ORAL
  Filled 2020-10-11 (×2): qty 1

## 2020-10-11 MED ORDER — OXYCODONE HCL 5 MG PO TABS
5.0000 mg | ORAL_TABLET | Freq: Three times a day (TID) | ORAL | Status: DC | PRN
  Administered 2020-10-11: 5 mg via ORAL
  Filled 2020-10-11: qty 1

## 2020-10-11 MED ORDER — OXYCODONE-ACETAMINOPHEN 10-325 MG PO TABS: 1.0000 | ORAL_TABLET | Freq: Three times a day (TID) | ORAL | Status: DC | PRN

## 2020-10-11 NOTE — Progress Notes (Signed)
Physical Therapy Treatment Patient Details Name: [name] MRN: [id number] DOB: [birth year] Today's Date: 10/11/2020    History of Present Illness 74 yo male with onset of LE weakness and edema on LE's was admitted, found to have hypoglycemia, AKI, metabolic acidosis, pancreatic insufficiency, and is on HD.  New Medic-alert bracelet due to frequent falls.  PMHx:  DM, GERD, HTN, DVT, COPD, renal cell CA, OSA, CKD3a, gastric bypass, back pain, L nephrectomy 2004.    PT Comments    Pt had, by far, the best PT session this hospitalization.  On arrival he was feeling good and doing back stretches (from his outpt PT) while sitting in the recliner.  He had been able to ambulate ~60 ft with mobility tech earlier this afternoon and was very eager to do all he could with PT.  Nursing was taking vitals on arrival and and all were good prior to ambulation, they stayed stable t/o the prolonged walking effort on room air.  He was slow and deliberate in getting to standing but did not need any assist and showed good safety and awareness.  We used a walker t/o the effort (reports he has one at home and will use one at least initially, cane is his baseline).  Overall he did very well, with no overt SOB of excessive fatigue and great effort.  He was slated to go to rehab tomorrow, however with today's performance he is safe to go home and this is the most appropriate option from a PT stand point.  Pt eager to get back to outpatient PT but understands that HHPT is more appropriate while he is recovering from hospitalization and getting to/from dialysis.    Follow Up Recommendations  Home health PT     Equipment Recommendations   (pt reports he has walkers at home, will need)    Recommendations for Other Services       Precautions / Restrictions Precautions Precautions: Fall Precaution Comments: back pain complaints Restrictions Weight Bearing Restrictions: No    Mobility  Bed Mobility                General bed mobility comments: in recliner on arrival, bending over doing back stretchs on PT's arrival  Transfers Overall transfer level: Modified independent Equipment used: Rolling walker (2 wheeled);1 person hand held assist Transfers: Sit to/from Stand Sit to Stand: Min guard         General transfer comment: Pt states "Give me some time, but I know how to get up."  He was heavily reliant on UEs but did rise to standing slowly and w/o assist  Ambulation/Gait Ambulation/Gait assistance: Supervision Gait Distance (Feet): 450 Feet Assistive device: Rolling walker (2 wheeled)       General Gait Details: Pt was able to maintain consistent and appropriate cadence x2 around the nurses' station.  His HR stayed in the 80-90s range the entire time and he did not have any shortness of breath of overt fatigue.  Pt has no LOBs and showed good safety and directional awareness.     Stairs             Wheelchair Mobility    Modified Rankin (Stroke Patients Only)       Balance Overall balance assessment: Modified Independent Sitting-balance support: Feet supported Sitting balance-Leahy Scale: Normal     Standing balance support: Bilateral upper extremity supported;During functional activity Standing balance-Leahy Scale: Fair (reliant on walker, but no stagger stepping or unsteadiness)  Cognition Arousal/Alertness: Awake/alert Behavior During Therapy: WFL for tasks assessed/performed Overall Cognitive Status: Within Functional Limits for tasks assessed                                        Exercises      General Comments        Pertinent Vitals/Pain Pain Assessment: 0-10 Pain Score: 3  Pain Location:  (chronic back pain)    Home Living                      Prior Function            PT Goals (current goals can now be found in the care plan section) Progress towards PT goals:  Progressing toward goals    Frequency    Min 2X/week      PT Plan Discharge plan needs to be updated    Co-evaluation              AM-PAC PT "6 Clicks" Mobility   Outcome Measure  Help needed turning from your back to your side while in a flat bed without using bedrails?: A Little Help needed moving from lying on your back to sitting on the side of a flat bed without using bedrails?: A Little Help needed moving to and from a bed to a chair (including a wheelchair)?: None Help needed standing up from a chair using your arms (e.g., wheelchair or bedside chair)?: None Help needed to walk in hospital room?: None Help needed climbing 3-5 steps with a railing? : None 6 Click Score: 22    End of Session Equipment Utilized During Treatment: Gait belt Activity Tolerance: Patient tolerated treatment well Patient left: in chair;with call bell/phone within reach;with chair alarm set;with family/visitor present Nurse Communication: Mobility status PT Visit Diagnosis: Unsteadiness on feet (R26.81);Repeated falls (R29.6);Ataxic gait (R26.0);Difficulty in walking, not elsewhere classified (R26.2)     Time: 1103-1594 PT Time Calculation (min) (ACUTE ONLY): 33 min  Charges:  $Gait Training: 23-37 mins                     Kreg Shropshire, DPT 10/11/2020, 5:40 PM

## 2020-10-11 NOTE — Progress Notes (Signed)
Mobility Specialist - Progress Note   10/11/20 1207  Mobility  Activity Contraindicated/medical hold  Mobility performed by Mobility specialist    Per discussion w/ nurse, pt going to dialysis this AM. Will hold and re-attempt when pt is available.     Avyon Herendeen Mobility Specialist  10/11/20, 12:08 PM

## 2020-10-11 NOTE — Progress Notes (Signed)
Patient in today to receive HD. Patient alert and oriented x 4, with no c/o any when asked. Patient transported in his bed and was able to transfer from bed to dialysis chair with standby assist, patient states he utilizes a walker at home. No issues with transferring. Writer explained the reasoning for the dialysis chair during today treatments, with patient stating understanding. Treatment started without any issues or concerns. 15 minutes into treatment, patient states "I don't feel right, I feel SOB." Writer asked for patient to described and patient states, " I feel like I'm about to faint." Patient becoming slow to respond to questions. Writer started [place] at 2LPM with oxygen saturation remaining >95%. Writer noticed blood pressure decreased dramatically since starting treatment and UF off at this time and noticing an improvement with blood pressure. Patient continues to state he doesn't feel quite right. Rinseback 36ml started at this time and Dr. Lynder Parents, NP being made aware, treatment discontinued at this time with approval.

## 2020-10-11 NOTE — Progress Notes (Signed)
[facility]  ROUNDING NOTE   Subjective:   [name] is 74 patient old male with past medical history of diabetes, hypertension, GERD, renal cell carcinoma post left nephrectomy in 2004, COPD, pancreatic insufficiency, CKD stage III , anemia of CKD sent in to the ED by his PCP for abnormal renal function test and progressively worsening bilateral lower extremity weakness. Patient found sitting up at the side of the bed, wife at the bedside.  He appears pleasant and active today.  He had a lot of questions about initiating dialysis at the outpatient dialysis setting and his rehab placement.  I answered his questions at the best of my ability.  [name], dialysis coordinator will go back and talk to the patient.    Objective:  Vital signs in last 24 hours:  Temp:  [97.8 F (36.6 C)-98.6 F (37 C)] 97.8 F (36.6 C) (10/28 1226) Pulse Rate:  [68-84] 77 (10/28 1226) Resp:  [12-24] 18 (10/28 1226) BP: (107-149)/(56-80) 149/71 (10/28 1226) SpO2:  [93 %-100 %] 98 % (10/28 1226) Weight:  [112.4 kg] 112.4 kg (10/28 0452)  Weight change: -0.816 kg Filed Weights   10/08/20 0626 10/10/20 0402 10/11/20 0452  Weight: 116.8 kg 113.2 kg 112.4 kg    Intake/Output: I/O last 3 completed shifts: In: 240 [P.O.:240] Out: 3400 [Urine:2400; Other:1000]   Intake/Output this shift:  Total I/O In: 480 [P.O.:480] Out: -342   Physical Exam: General:  Appears pleasant and comfortable  Head:   moist oral mucosal membranes  Eyes:  Sclerae and conjunctivae clear  Lungs:   Respirations even, unlabored, on room air, lungs clear  Heart:  S1-S2, no rubs or gallops  Abdomen:   Nondistended  Extremities:  No bilateral lower extremity edema  Neurologic:  Oriented x3  Skin: No acute lesions or rashes  Right IJ permacath  Basic Metabolic Panel: Recent Labs  Lab 10/05/20 0600 10/05/20 0600 10/06/20 0500 10/06/20 0500 10/07/20 0529 10/07/20 0529 10/08/20 0631 10/08/20 0631  10/09/20 0445 10/10/20 0500 10/10/20 1106 10/11/20 0409 10/11/20 0753  NA 138   < > 143   < > 141  --  142  --  139 139  --  140  --   K 3.1*   < > 3.6   < > 3.5  --  4.8  --  4.1 3.7  --  3.7  --   CL 100   < > 106   < > 105  --  105  --  106 102  --  100  --   CO2 26   < > 26   < > 27  --  25  --  23 25  --  30  --   GLUCOSE 152*   < > 167*   < > 144*  --  203*  --  211* 195*  --  198*  --   BUN 48*   < > 59*   < > 41*  --  56*  --  69* 51*  --  37*  --   CREATININE 5.05*   < > 6.20*   < > 4.70*  --  5.84*  --  6.71* 5.28*  --  4.18*  --   CALCIUM 7.2*   < > 7.2*   < > 8.0*   < > 8.6*   < > 8.1* 8.5*  --  8.7*  --   MG 1.9   < > 2.0  --  2.0  --  2.2  --  2.2 2.0  --   --   --   PHOS 5.6*  --  6.3*  --  5.0*  --   --   --   --   --  3.2  --  4.9*   < > = values in this interval not displayed.    Liver Function Tests: Recent Labs  Lab 10/10/20 0500  AST 16  ALT 16  ALKPHOS 98  BILITOT 0.7  PROT 6.3*  ALBUMIN 3.1*   No results for input(s): LIPASE, AMYLASE in the last 168 hours. No results for input(s): AMMONIA in the last 168 hours.  CBC: Recent Labs  Lab 10/05/20 0600 10/06/20 0500 10/07/20 0529 10/08/20 0631 10/09/20 0445 10/10/20 0500 10/11/20 0409  WBC 6.0   < > 5.6 7.1 7.5 7.8 5.5  NEUTROABS 4.6  --   --   --   --   --  3.8  HGB 7.0*   < > 7.8* 8.4* 8.1* 8.3* 8.1*  HCT 21.4*   < > 24.5* 26.3* 25.5* 26.3* 25.4*  MCV 96.4   < > 95.3 97.8 97.3 96.0 96.6  PLT 158   < > 162 195 190 208 184   < > = values in this interval not displayed.    Cardiac Enzymes: No results for input(s): CKTOTAL, CKMB, CKMBINDEX, TROPONINI in the last 168 hours.  BNP: Invalid input(s): POCBNP  CBG: Recent Labs  Lab 10/09/20 2119 10/10/20 0806 10/10/20 1617 10/10/20 2125 10/11/20 0818  GLUCAP 228* 147* 167* 190* 157*    Microbiology: Results for orders placed or performed during the hospital encounter of 09/30/20  Respiratory Panel by RT PCR (Flu A&B, Covid) -  Nasopharyngeal Swab     Status: None   Collection Time: 09/30/20  9:07 PM   Specimen: Nasopharyngeal Swab  Result Value Ref Range Status   SARS Coronavirus 2 by RT PCR NEGATIVE NEGATIVE Final    Comment: (NOTE) SARS-CoV-2 target nucleic acids are NOT DETECTED.  The SARS-CoV-2 RNA is generally detectable in upper respiratoy specimens during the acute phase of infection. The lowest concentration of SARS-CoV-2 viral copies this assay can detect is 131 copies/mL. A negative result does not preclude SARS-Cov-2 infection and should not be used as the sole basis for treatment or other patient management decisions. A negative result may occur with  improper specimen collection/handling, submission of specimen other than nasopharyngeal swab, presence of viral mutation(s) within the areas targeted by this assay, and inadequate number of viral copies (<131 copies/mL). A negative result must be combined with clinical observations, patient history, and epidemiological information. The expected result is Negative.  Fact Sheet for Patients:  PinkCheek.be  Fact Sheet for Healthcare Providers:  GravelBags.it  This test is no t yet approved or cleared by the [place] FDA and  has been authorized for detection and/or diagnosis of SARS-CoV-2 by FDA under an Emergency Use Authorization (EUA). This EUA will remain  in effect (meaning this test can be used) for the duration of the COVID-19 declaration under Section 564(b)(1) of the Act, 21 U.S.C. section 360bbb-3(b)(1), unless the authorization is terminated or revoked sooner.     Influenza A by PCR NEGATIVE NEGATIVE Final   Influenza B by PCR NEGATIVE NEGATIVE Final    Comment: (NOTE) The Xpert Xpress SARS-CoV-2/FLU/RSV assay is intended as an aid in  the diagnosis of influenza from Nasopharyngeal swab specimens and  should not be used as a sole basis for treatment. Nasal washings and   aspirates are  unacceptable for Xpert Xpress SARS-CoV-2/FLU/RSV  testing.  Fact Sheet for Patients: PinkCheek.be  Fact Sheet for Healthcare Providers: GravelBags.it  This test is not yet approved or cleared by the [place] FDA and  has been authorized for detection and/or diagnosis of SARS-CoV-2 by  FDA under an Emergency Use Authorization (EUA). This EUA will remain  in effect (meaning this test can be used) for the duration of the  Covid-19 declaration under Section 564(b)(1) of the Act, 21  U.S.C. section 360bbb-3(b)(1), unless the authorization is  terminated or revoked. Performed at [facility], [address] [place], [place] [address]   CULTURE, BLOOD (ROUTINE X 2) w Reflex to ID Panel     Status: None   Collection Time: 10/02/20  1:16 PM   Specimen: BLOOD  Result Value Ref Range Status   Specimen Description BLOOD LEFT ANTECUBITAL  Final   Special Requests   Final    BOTTLES DRAWN AEROBIC AND ANAEROBIC Blood Culture adequate volume   Culture   Final    NO GROWTH 5 DAYS Performed at [facility], [address] [place], [place] [address]    Report Status 10/07/2020 FINAL  Final  CULTURE, BLOOD (ROUTINE X 2) w Reflex to ID Panel     Status: None   Collection Time: 10/02/20  1:33 PM   Specimen: BLOOD  Result Value Ref Range Status   Specimen Description BLOOD BLOOD LEFT HAND  Final   Special Requests   Final    BOTTLES DRAWN AEROBIC AND ANAEROBIC Blood Culture adequate volume   Culture   Final    NO GROWTH 5 DAYS Performed at [facility], [address] [place], [place] [address]    Report Status 10/07/2020 FINAL  Final    Coagulation Studies: No results for input(s): LABPROT, INR in the last 72 hours.  Urinalysis: No results for input(s): COLORURINE, LABSPEC, PHURINE, GLUCOSEU, HGBUR, BILIRUBINUR, KETONESUR, PROTEINUR, UROBILINOGEN, NITRITE, LEUKOCYTESUR in the last  72 hours.  Invalid input(s): APPERANCEUR    Imaging: No results found.   Medications:    . apixaban  2.5 mg Oral BID  . carvedilol  6.25 mg Oral BID WC  . Chlorhexidine Gluconate Cloth  6 each Topical Q0600  . DULoxetine  30 mg Oral Daily  . epoetin (EPOGEN/PROCRIT) injection  4,000 Units Intravenous Q T,Th,Sa-HD  . feeding supplement (NEPRO CARB STEADY)  237 mL Oral BID BM  . gabapentin  100 mg Oral QHS  . guaiFENesin  5 mL Oral TID  . insulin aspart  0-6 Units Subcutaneous TID WC  . lipase/protease/amylase  72,000 Units Oral TID WC  . mirtazapine  30 mg Oral QHS  . multivitamin  1 tablet Oral QHS  . pantoprazole  40 mg Oral BID  . rOPINIRole  4 mg Oral BID AC  . sodium chloride flush  3 mL Intravenous Q12H  . sucralfate  1 g Oral TID  . thiamine  100 mg Oral Daily   acetaminophen **OR** acetaminophen, albuterol, clonazePAM, ondansetron **OR** ondansetron (ZOFRAN) IV, oxyCODONE-acetaminophen **AND** oxyCODONE, sodium chloride flush  Assessment/ Plan:  Mr. Sohil Timko is a 74 y.o.  male Mr. Seals is 74 years old male with past medical history of diabetes, hypertension, GERD, renal cell carcinoma post left nephrectomy in 2004, COPD, pancreatic insufficiency, CKD stage III , anemia of CKD sent in to the ED by his PCP for abnormal renal function test and progressively worsening bilateral lower extremity weakness.  # Acute kidney injury  on CKD stage IIIa #Metabolic acidosis -Patient  has history of renal cell carcinoma with left nephrectomy in 2004 Patient's baseline creatinine is 2.2/GFR 30 from 06/26/2020 Lab Results  Component Value Date   CREATININE 4.18 (H) 10/11/2020   CREATININE 5.28 (H) 10/10/2020   CREATININE 6.71 (H) 10/09/2020   Patient has outpatient dialysis center set up for TTS schedule We attempted to dialyze him today for 2 hours, patient didn't tolerate treatment well, became hypotensive and symptomatic with dizziness. Dialysis treatment  discontinued after approximately  20 minutes of initiation Patient's outpatient dialysis center will start dialyzing him on next Tuesday We will reassess him tomorrow  and will decide on dialysis   #Anemia of CKD Lab Results  Component Value Date   HGB 8.1 (L) 10/11/2020   We will continue Epogen with dialysis  # Secondary Hyperparathyroidism Patient's calcium and phosphorus levels close to goal We will continue monitoring bone mineral metabolism parameters      LOS: 11 Teiana Hajduk 10/28/20213:15 PM

## 2020-10-11 NOTE — Progress Notes (Signed)
Mobility Specialist - Progress Note   10/11/20 1512  Mobility  Activity Ambulated in room  Level of Assistance Standby assist, set-up cues, supervision of patient - no hands on (CGA for safety)  Assistive Device Front wheel walker  Distance Ambulated (ft) 60 ft  Mobility Response Tolerated well  Mobility performed by Mobility specialist  $Mobility charge 1 Mobility    Pt sitting EOB w/ wife present in room. Pt agreed to session. Pt performed S2S x 3 SBA. Pt requested to elevate the bed for assistance to stand. No LOB noted. Pt progressed to transfer to recliner using a RW w/ SBA. No LOB noted. Pt requested to ambulate. Pt progressed to ambulate in his room 2x using a RW w/ SBA. CGA utilized for safety. No LOB noted. Pt had slow and steady gait t/o ambulation. O2 monitored t/o session. O2 sat > 90%. Pt on RA. Pt encouraged to ambulate to bathroom and mobilize in between sessions to maintain/improve overall strength. Overall, pt tolerated session well. Pt left sitting on recliner w/ lunch tray placed in front of him and wife present in room. Call bell and phone placed in reach. Nurse was notified.     Juluis Fitzsimmons Mobility Specialist  10/11/20, 3:42 PM

## 2020-10-11 NOTE — Progress Notes (Signed)
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 He was referred to the emergency room by his PCP because of lower extremity weakness and acute kidney injury. He was found to have acute kidney injury complicated by hyperkalemia, metabolic acidosis, uremic encephalopathy, myoclonic jerks, hyperphosphatemia and hypocalcemia. He was treated with IV sodium bicarbonate infusion. Nephrologist was consulted to assist with management.  Patient did have worsening kidney function with fluid overload and hypoxic respiratory failure.  Vascular surgery was consulted for a temporary dialysis catheter placement.  Patient underwent hemodialysis with improvement in his metabolic encephalopathy.    Consultants  . Nephrology . Vascular surgery  Procedures  . Temporary dialysis catheter placement . Dialysis  Antibiotics   Anti-infectives (From admission, onward)   Start     Dose/Rate Route Frequency Ordered Stop   10/09/20 1341  ceFAZolin (ANCEF) 1-4 GM/50ML-% IVPB       Note to Pharmacy: Corlis Hove   : cabinet override      10/09/20 1341 10/09/20 1421   10/02/20 1300  cefTRIAXone (ROCEPHIN) 1 g in sodium chloride 0.9 % 100 mL IVPB  Status:  Discontinued        1 g 200 mL/hr over 30 Minutes Intravenous Every 24 hours 10/02/20 1204 10/05/20 1104     Subjective  The patient is sitting up at bedside. No new complaints.   Objective   Vitals:  Vitals:   10/11/20 1150 10/11/20 1226  BP: (!) 130/58 (!) 149/71  Pulse: 69 77  Resp: (!) 23 18  Temp:   97.8 F (36.6 C)  SpO2: 97% 98%    Exam:  Constitutional:  . The patient is awake, alert, and oriented x 3. No acute distress. Respiratory:  . No increased work of breathing. . No wheezes, rales, or rhonchi . No tactile fremitus Cardiovascular:  . Regular rate and rhythm . No murmurs, ectopy, or gallups. . No lateral PMI. No thrills. Abdomen:  . Abdomen is soft, non-tender, non-distended . No hernias, masses, or organomegaly . Normoactive bowel sounds.  Musculoskeletal:  . No cyanosis, clubbing, or edema Skin:  . No rashes, lesions, ulcers . palpation of skin: no induration or nodules Neurologic:  . CN 2-12 intact . Sensation all 4 extremities intact Psychiatric:  . Mental status o Mood, affect appropriate o Orientation to person, place, time  . judgment and insight appear intact  I have personally reviewed the following:   Today's Data  . Vitals, BMP, CBC  Micro Data  . Blood cultures x 2: No growth  Imaging  . CT head without contrast. . Portable chest x-ray  Scheduled Meds: . apixaban  2.5 mg Oral BID  . carvedilol  6.25 mg Oral BID WC  . Chlorhexidine Gluconate Cloth  6 each Topical Q0600  . DULoxetine  30 mg Oral Daily  . epoetin (EPOGEN/PROCRIT) injection  4,000 Units Intravenous Q T,Th,Sa-HD  . feeding supplement (NEPRO CARB STEADY)  237 mL Oral BID BM  . gabapentin  100 mg Oral QHS  . guaiFENesin  5 mL Oral TID  . insulin aspart  0-6 Units Subcutaneous TID WC  . lipase/protease/amylase  72,000 Units Oral TID WC  .  mirtazapine  30 mg Oral QHS  . multivitamin  1 tablet Oral QHS  . pantoprazole  40 mg Oral BID  . rOPINIRole  4 mg Oral BID AC  . sodium chloride flush  3 mL Intravenous Q12H  . sucralfate  1 g Oral TID  . thiamine  100 mg Oral Daily   Continuous Infusions:  Principal Problem:   AKI (acute kidney injury) ([place]) Active Problems:   Essential hypertension   Type II diabetes mellitus with renal manifestations (HCC)   Anemia in  chronic kidney disease   Spinal stenosis, lumbar region, with neurogenic claudication   Hyperkalemia   COPD (chronic obstructive pulmonary disease) ([place])   CKD stage 3 due to type 2 diabetes mellitus ([place])   History of renal cell carcinoma   History of left nephrectomy 2004   Hypoglycemia   Pancreatic insufficiency   Chronic diarrhea   Metabolic acidosis   Malnutrition of moderate degree   LOS: 11 days   A & P  AKI on CKD stage IIIb:  History of left nephrectomy for renal cell cancer.  Currently receiving the intermittent hemodialysis.  Patient presented with severe uremic symptoms, severe metabolic acidosis, hyperkalemia .  His symptoms have largely improved after initiation of hemodialysis.  Permacath has been placed and the patient has been initiated on dialysis.   Acute metabolic encephalopathy/uremic encephalopathy: Improving.  Continue hemodialysis.  Medications have been gradually restarted.  On oral thiamine as well.   Acute hypoxemic respiratory failure: He is currently saturating 95% on room air.  Oxygen has been weaned down to room air from 5 L..  Chest x-ray from 10/07/2020 with mild left basilar atelectasis.  Patient was encouraged incentive as permitted deep breathing.  Hemodialysis for volume management.  Hyperkalemia: Resolved with hemodialysis.    Hypomagnesemia: Resolved.  Essential hypertension: Continue carvedilol.  Blood pressure was largely controlled but slightly higher today.  Continue to monitor.  History of DVT and PE: Continue Eliquis renal dosing.  Would benefit from same dosing on discharge.  Type II DM with recurrent hypoglycemia on presentation: Off glipizide.  Improving hypoglycemia after dialysis.  Latest POC glucose of 163.Marland Kitchen  Patient has been started on sensitive scale sliding scale insulin since yesterday.  Anemia of chronic kidney disease: Received 1 unit of packed RBC for hemoglobin of 6.5.  Hemoglobin of 8.1 today..  EPO/iron as  recommended per nephrology.  Serum iron of 44, TIBC 183.  Ferritin of 182.  Iron saturation at 24%.    Chronic pancreatitis/chronic diarrhea: Continue Creon    COPD: As needed bronchodilators.  compensated.   Chronic back pain: Hold off with narcotics for now.   Depression: Has been resumed Remeron to 30 mg at nighttime.  Will resume Cymbalta, but start at a lower dose due to renal insufficiency.  Debility, weakness:  Awaiting for skilled nursing facility placement.  I have seen and examined this patient myself. I have spent 32 minutes in his evaluation and care.  DVT prophylaxis: apixaban (ELIQUIS) tablet 2.5 mg Start: 10/02/20 1000 apixaban (ELIQUIS) tablet 2.5 mg  Code Status: Full code Family Communication: Wife is at bedside during visit. Disposition:  Status is: Inpatient  Remains inpatient appropriate because:Persistent severe electrolyte disturbances, Unsafe d/c plan, IV treatments appropriate due to intensity of illness or inability to take PO, Inpatient level of care appropriate due to severity of illness and Need for hemodialysis, need for permacath placement and outpatient hemodialysis.  Dispo: The patient is from: Home  Anticipated d/c is to:  skilled nursing facility placement on discharge.  Anticipated d/c date is: 2 to 3 days, will need outpatient hemodialysis set up, pending permacath placement today,  Patient currently is not medically stable to d/c.   Brittnye Josephs, DO [facility] Direct contact: see [email or web address]  7PM-7AM contact night coverage as above 10/11/2020, 4:16 PM  LOS: 10 days

## 2020-10-11 NOTE — Progress Notes (Signed)
Initial Nutrition Assessment  DOCUMENTATION CODES:   Non-severe (moderate) malnutrition in context of chronic illness  INTERVENTION:   Nepro Shake po BID, each supplement provides 425 kcal and 19 grams protein  Rena-vit daily   Renal diet education   NUTRITION DIAGNOSIS:   Moderate Malnutrition related to chronic illness (DM, renal cell carcinoma, COPD, CKD) as evidenced by moderate fat depletion, moderate muscle depletion.  GOAL:   Patient will meet greater than or equal to 90% of their needs  MONITOR:   PO intake, Supplement acceptance, Labs, Weight trends, Skin, I & O's  REASON FOR ASSESSMENT:   LOS    ASSESSMENT:   74 y.o. 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 Pt initiate on HD 10/19.  Met with pt and pt's wife in room today. Pt reports good appetite and oral intake pta and in hospital; pt reports that "I am eating everything in sight". Pt is documented to be eating 100% of meals. Pt is refusing the Nepro supplements as he reports that they are terrible. RD discussed with pt the importance of adequate protein needed to preserve lean muscle on HD. Pt reports that he is willing to give the supplements another try; pt would like to try the mixed berry. Pt may also have Ensure if he does not like the Nepro mixed berry. Per chart, pt is down 20lbs(7%) over the past 6 months; this is not significant. Pt reports that he has been dieting and trying to loose weight pta. Recommended renal friendly supplements after discharge. RD provided pt with renal diet education today.   Provided "Low sodium Nutrition Therapy" handout from  the Academy of [employer]. Reviewed food groups and provided written recommended serving sizes specifically determined for patient's current nutritional status.   Explained why diet restrictions are needed and provided lists of foods to limit/avoid that are high potassium, sodium, and phosphorus. Provided specific recommendations on safer alternatives of these foods. Strongly encouraged compliance of this diet.   Discussed importance of protein intake at each meal and snack. Provided examples of how to maximize protein intake throughout the day. Discussed the possible need for fluid restriction with dialysis, importance of minimizing weight gain between HD treatments, and renal-friendly beverage options.  Encouraged pt to take a daily renal multivitamin   Teach back method used.  Medications reviewed and include: epogen, insulin, creon, protonix, remeron, carafate, thiamine   Labs reviewed: K 3.7 wnl, BUN 37(H), creat 4.18(H), P 4.9(H) Hgb 8.1(L), Hct 25.4(L) cbgs- 147, 167, 190, 157 x 24 hrs AIC 4.9- 10/17  NUTRITION - FOCUSED PHYSICAL EXAM:    Most Recent Value  Orbital Region Mild depletion  Upper Arm Region Moderate depletion  Thoracic and Lumbar Region Moderate depletion  Buccal Region Mild depletion  Temple Region Mild depletion  Clavicle Bone Region Moderate depletion  Clavicle and Acromion Bone Region Moderate depletion  Scapular Bone Region Mild depletion  Dorsal Hand Mild depletion  Patellar Region Mild depletion  Anterior Thigh Region Moderate depletion  Posterior Calf Region Mild depletion  Edema (RD Assessment) Mild  Hair Reviewed  Eyes Reviewed  Mouth Reviewed  Skin Reviewed  Nails Reviewed     Diet Order:  Diet Order            Diet renal/carb modified with fluid restriction Diet-HS Snack? Nothing; Fluid restriction: 1200 mL Fluid; Room service appropriate? Yes; Fluid consistency: Thin  Diet effective now                EDUCATION NEEDS:    Education needs have been addressed  Skin:  Skin Assessment: Reviewed RN Assessment (ecchymosis)  Last BM:  10/28- type 4  Height:   Ht Readings from Last 1 Encounters:  10/01/20 6' (1.829 m)    Weight:   Wt Readings from Last 1 Encounters:  10/11/20 112.4 kg    Ideal Body Weight:  80.9 kg  BMI:  Body mass index is 33.61 kg/m.  Estimated Nutritional Needs:   Kcal:  2500-2800kcal/day  Protein:  >125g/day  Fluid:  UOP +1L  Koleen Distance MS, RD, LDN Please refer to [facility] for RD and/or RD on-call/weekend/after hours pager

## 2020-10-12 LAB — CBC WITH DIFFERENTIAL/PLATELET
Abs Immature Granulocytes: 0.04 10*3/uL (ref 0.00–0.07)
Basophils Absolute: 0 10*3/uL (ref 0.0–0.1)
Basophils Relative: 1 %
Eosinophils Absolute: 0.2 10*3/uL (ref 0.0–0.5)
Eosinophils Relative: 4 %
HCT: 27.7 % — ABNORMAL LOW (ref 39.0–52.0)
Hemoglobin: 8.8 g/dL — ABNORMAL LOW (ref 13.0–17.0)
Immature Granulocytes: 1 %
Lymphocytes Relative: 16 %
Lymphs Abs: 1 10*3/uL (ref 0.7–4.0)
MCH: 31 pg (ref 26.0–34.0)
MCHC: 31.8 g/dL (ref 30.0–36.0)
MCV: 97.5 fL (ref 80.0–100.0)
Monocytes Absolute: 0.5 10*3/uL (ref 0.1–1.0)
Monocytes Relative: 8 %
Neutro Abs: 4.3 10*3/uL (ref 1.7–7.7)
Neutrophils Relative %: 70 %
Platelets: 182 10*3/uL (ref 150–400)
RBC: 2.84 MIL/uL — ABNORMAL LOW (ref 4.22–5.81)
RDW: 14.1 % (ref 11.5–15.5)
WBC: 6 10*3/uL (ref 4.0–10.5)
nRBC: 0 % (ref 0.0–0.2)

## 2020-10-12 LAB — BASIC METABOLIC PANEL
Anion gap: 11 (ref 5–15)
BUN: 50 mg/dL — ABNORMAL HIGH (ref 8–23)
CO2: 27 mmol/L (ref 22–32)
Calcium: 9.2 mg/dL (ref 8.9–10.3)
Chloride: 101 mmol/L (ref 98–111)
Creatinine, Ser: 5.02 mg/dL — ABNORMAL HIGH (ref 0.61–1.24)
GFR, Estimated: 12 mL/min — ABNORMAL LOW (ref >60)
Glucose, Bld: 260 mg/dL — ABNORMAL HIGH (ref 70–99)
Potassium: 4.2 mmol/L (ref 3.5–5.1)
Sodium: 139 mmol/L (ref 135–145)

## 2020-10-12 LAB — GLUCOSE, CAPILLARY: Glucose-Capillary: 202 mg/dL — ABNORMAL HIGH (ref 70–99)

## 2020-10-12 LAB — VITAMIN B6: Vitamin B6: 1 ug/L — ABNORMAL LOW (ref 5.3–46.7)

## 2020-10-12 LAB — PHOSPHORUS: Phosphorus: 4.1 mg/dL (ref 2.5–4.6)

## 2020-10-12 MED ORDER — THIAMINE HCL 100 MG PO TABS
100.0000 mg | ORAL_TABLET | Freq: Every day | ORAL | 0 refills | Status: DC
Start: 2020-10-13 — End: 2020-12-27

## 2020-10-12 MED ORDER — OXYCODONE HCL 5 MG PO TABS: 5.0000 mg | ORAL_TABLET | Freq: Three times a day (TID) | ORAL | 0 refills | Status: DC | PRN

## 2020-10-12 MED ORDER — APIXABAN 2.5 MG PO TABS: 2.5000 mg | ORAL_TABLET | Freq: Two times a day (BID) | ORAL | 0 refills | Status: AC

## 2020-10-12 MED ORDER — RENA-VITE PO TABS: 1.0000 | ORAL_TABLET | Freq: Every day | ORAL | 0 refills | Status: AC

## 2020-10-12 MED ORDER — ROPINIROLE HCL 4 MG PO TABS: 4.0000 mg | ORAL_TABLET | Freq: Two times a day (BID) | ORAL | 0 refills | Status: AC

## 2020-10-12 MED ORDER — NEPRO/CARBSTEADY PO LIQD
237.0000 mL | Freq: Two times a day (BID) | ORAL | 0 refills | Status: DC
Start: 2020-10-13 — End: 2020-12-27

## 2020-10-12 NOTE — Progress Notes (Signed)
[facility]  ROUNDING NOTE   Subjective:   [name] is 74 patient old male with past medical history of diabetes, hypertension, GERD, renal cell carcinoma post left nephrectomy in 2004, COPD, pancreatic insufficiency, CKD stage III , anemia of CKD sent in to the ED by his PCP for abnormal renal function test and progressively worsening bilateral lower extremity weakness. Patient is receiving a short session of dialysis treatment today ,prior to discharge.  He appears excited about getting discharged.   HEMODIALYSIS FLOWSHEET:  Blood Flow Rate (mL/min): (P) 250 mL/min Arterial Pressure (mmHg): -90 mmHg Venous Pressure (mmHg): 80 mmHg Transmembrane Pressure (mmHg): 70 mmHg Ultrafiltration Rate (mL/min): 200 mL/min Dialysate Flow Rate (mL/min): (P) 300 ml/min Conductivity: Machine : 14 Conductivity: Machine : 14 Dialysis Fluid Bolus: Normal Saline Bolus Amount (mL): 200 mL    Objective:  Vital signs in last 24 hours:  Temp:  [98.1 F (36.7 C)-98.6 F (37 C)] 98.1 F (36.7 C) (10/29 1528) Pulse Rate:  [68-88] 82 (10/29 1528) Resp:  [16-22] 19 (10/29 1528) BP: (117-147)/(63-81) 119/63 (10/29 1528) SpO2:  [93 %-98 %] 95 % (10/29 1528) Weight:  [113.3 kg] 113.3 kg (10/29 0437)  Weight change: 0.899 kg Filed Weights   10/10/20 0402 10/11/20 0452 10/12/20 0437  Weight: 113.2 kg 112.4 kg 113.3 kg    Intake/Output: I/O last 3 completed shifts: In: 720 [P.O.:720] Out: 983 [Urine:1325]   Intake/Output this shift:  Total I/O In: 240 [P.O.:240] Out: 300 [Urine:300]  Physical Exam: General:  Resting in bed, receiving dialysis treatment  Head:   Normocephalic,Atraumatic  Eyes:  Anicteric  Lungs:  lungs clear, normal respiratory effort  Heart:  Regular rate and rhythm  Abdomen:   Soft, Nondistended  Extremities: No peripheral edema  Neurologic:  Awake,alert, oriented  Skin: No acute lesions or rashes  Right IJ permacath  Basic Metabolic Panel: Recent  Labs  Lab 10/06/20 0500 10/06/20 0500 10/07/20 0529 10/07/20 0529 10/08/20 0631 10/08/20 0631 10/09/20 0445 10/09/20 0445 10/10/20 0500 10/10/20 1106 10/11/20 0409 10/11/20 0753 10/12/20 0547 10/12/20 0548  NA 143   < > 141   < > 142  --  139  --  139  --  140  --   --  139  K 3.6   < > 3.5   < > 4.8  --  4.1  --  3.7  --  3.7  --   --  4.2  CL 106   < > 105   < > 105  --  106  --  102  --  100  --   --  101  CO2 26   < > 27   < > 25  --  23  --  25  --  30  --   --  27  GLUCOSE 167*   < > 144*   < > 203*  --  211*  --  195*  --  198*  --   --  260*  BUN 59*   < > 41*   < > 56*  --  69*  --  51*  --  37*  --   --  50*  CREATININE 6.20*   < > 4.70*   < > 5.84*  --  6.71*  --  5.28*  --  4.18*  --   --  5.02*  CALCIUM 7.2*   < > 8.0*   < > 8.6*   < > 8.1*   < >  8.5*  --  8.7*  --   --  9.2  MG 2.0  --  2.0  --  2.2  --  2.2  --  2.0  --   --   --   --   --   PHOS 6.3*  --  5.0*  --   --   --   --   --   --  3.2  --  4.9* 4.1  --    < > = values in this interval not displayed.    Liver Function Tests: Recent Labs  Lab 10/10/20 0500  AST 16  ALT 16  ALKPHOS 98  BILITOT 0.7  PROT 6.3*  ALBUMIN 3.1*   No results for input(s): LIPASE, AMYLASE in the last 168 hours. No results for input(s): AMMONIA in the last 168 hours.  CBC: Recent Labs  Lab 10/08/20 0631 10/09/20 0445 10/10/20 0500 10/11/20 0409 10/12/20 0548  WBC 7.1 7.5 7.8 5.5 6.0  NEUTROABS  --   --   --  3.8 4.3  HGB 8.4* 8.1* 8.3* 8.1* 8.8*  HCT 26.3* 25.5* 26.3* 25.4* 27.7*  MCV 97.8 97.3 96.0 96.6 97.5  PLT 195 190 208 184 182    Cardiac Enzymes: No results for input(s): CKTOTAL, CKMB, CKMBINDEX, TROPONINI in the last 168 hours.  BNP: Invalid input(s): POCBNP  CBG: Recent Labs  Lab 10/10/20 2125 10/11/20 0818 10/11/20 1631 10/11/20 2018 10/12/20 0805  GLUCAP 190* 157* 176* 218* 7*    Microbiology: Results for orders placed or performed during the hospital encounter of 09/30/20   Respiratory Panel by RT PCR (Flu A&B, Covid) - Nasopharyngeal Swab     Status: None   Collection Time: 09/30/20  9:07 PM   Specimen: Nasopharyngeal Swab  Result Value Ref Range Status   SARS Coronavirus 2 by RT PCR NEGATIVE NEGATIVE Final    Comment: (NOTE) SARS-CoV-2 target nucleic acids are NOT DETECTED.  The SARS-CoV-2 RNA is generally detectable in upper respiratoy specimens during the acute phase of infection. The lowest concentration of SARS-CoV-2 viral copies this assay can detect is 131 copies/mL. A negative result does not preclude SARS-Cov-2 infection and should not be used as the sole basis for treatment or other patient management decisions. A negative result may occur with  improper specimen collection/handling, submission of specimen other than nasopharyngeal swab, presence of viral mutation(s) within the areas targeted by this assay, and inadequate number of viral copies (<131 copies/mL). A negative result must be combined with clinical observations, patient history, and epidemiological information. The expected result is Negative.  Fact Sheet for Patients:  PinkCheek.be  Fact Sheet for Healthcare Providers:  GravelBags.it  This test is no t yet approved or cleared by the [place] FDA and  has been authorized for detection and/or diagnosis of SARS-CoV-2 by FDA under an Emergency Use Authorization (EUA). This EUA will remain  in effect (meaning this test can be used) for the duration of the COVID-19 declaration under Section 564(b)(1) of the Act, 21 U.S.C. section 360bbb-3(b)(1), unless the authorization is terminated or revoked sooner.     Influenza A by PCR NEGATIVE NEGATIVE Final   Influenza B by PCR NEGATIVE NEGATIVE Final    Comment: (NOTE) The Xpert Xpress SARS-CoV-2/FLU/RSV assay is intended as an aid in  the diagnosis of influenza from Nasopharyngeal swab specimens and  should not be used as  a sole basis for treatment. Nasal washings and  aspirates are unacceptable for Xpert Xpress SARS-CoV-2/FLU/RSV  testing.  Fact Sheet for Patients: PinkCheek.be  Fact Sheet for Healthcare Providers: GravelBags.it  This test is not yet approved or cleared by the [place] FDA and  has been authorized for detection and/or diagnosis of SARS-CoV-2 by  FDA under an Emergency Use Authorization (EUA). This EUA will remain  in effect (meaning this test can be used) for the duration of the  Covid-19 declaration under Section 564(b)(1) of the Act, 21  U.S.C. section 360bbb-3(b)(1), unless the authorization is  terminated or revoked. Performed at [facility], [address] [place], [place] [address]   CULTURE, BLOOD (ROUTINE X 2) w Reflex to ID Panel     Status: None   Collection Time: 10/02/20  1:16 PM   Specimen: BLOOD  Result Value Ref Range Status   Specimen Description BLOOD LEFT ANTECUBITAL  Final   Special Requests   Final    BOTTLES DRAWN AEROBIC AND ANAEROBIC Blood Culture adequate volume   Culture   Final    NO GROWTH 5 DAYS Performed at [facility], [address] [place], [place] [address]    Report Status 10/07/2020 FINAL  Final  CULTURE, BLOOD (ROUTINE X 2) w Reflex to ID Panel     Status: None   Collection Time: 10/02/20  1:33 PM   Specimen: BLOOD  Result Value Ref Range Status   Specimen Description BLOOD BLOOD LEFT HAND  Final   Special Requests   Final    BOTTLES DRAWN AEROBIC AND ANAEROBIC Blood Culture adequate volume   Culture   Final    NO GROWTH 5 DAYS Performed at [facility], [address] [place], [place] [address]    Report Status 10/07/2020 FINAL  Final    Coagulation Studies: No results for input(s): LABPROT, INR in the last 72 hours.  Urinalysis: No results for input(s): COLORURINE, LABSPEC, PHURINE, GLUCOSEU, HGBUR, BILIRUBINUR, KETONESUR, PROTEINUR,  UROBILINOGEN, NITRITE, LEUKOCYTESUR in the last 72 hours.  Invalid input(s): APPERANCEUR    Imaging: No results found.   Medications:    . apixaban  2.5 mg Oral BID  . carvedilol  6.25 mg Oral BID WC  . Chlorhexidine Gluconate Cloth  6 each Topical Q0600  . DULoxetine  30 mg Oral Daily  . epoetin (EPOGEN/PROCRIT) injection  4,000 Units Intravenous Q T,Th,Sa-HD  . feeding supplement (NEPRO CARB STEADY)  237 mL Oral BID BM  . gabapentin  100 mg Oral QHS  . guaiFENesin  5 mL Oral TID  . insulin aspart  0-6 Units Subcutaneous TID WC  . lipase/protease/amylase  72,000 Units Oral TID WC  . mirtazapine  30 mg Oral QHS  . multivitamin  1 tablet Oral QHS  . pantoprazole  40 mg Oral BID  . rOPINIRole  4 mg Oral BID AC  . sodium chloride flush  3 mL Intravenous Q12H  . sucralfate  1 g Oral TID  . thiamine  100 mg Oral Daily   acetaminophen **OR** acetaminophen, albuterol, clonazePAM, ondansetron **OR** ondansetron (ZOFRAN) IV, oxyCODONE-acetaminophen **AND** oxyCODONE, sodium chloride flush  Assessment/ Plan:  [name] is a 74 y.o.  male [name] is 74 years old male with past medical history of diabetes, hypertension, GERD, renal cell carcinoma post left nephrectomy in 2004, COPD, pancreatic insufficiency, CKD stage III , anemia of CKD sent in to the ED by his PCP for abnormal renal function test and progressively worsening bilateral lower extremity weakness.  # Acute kidney injury on CKD stage IIIa #Metabolic acidosis -Patient  has history of renal cell carcinoma with left nephrectomy in 2004 Patient's baseline creatinine is 2.2/GFR 30 from 06/26/2020 Lab Results  Component Value Date   CREATININE 5.02 (H) 10/12/2020   CREATININE 4.18 (H) 10/11/2020   CREATININE 5.28 (H) 10/10/2020   Patient has outpatient dialysis center set up, at [place] next Tuesday He is receiving a short session of dialysis without ultrafiltration today,prior  to discharge. He is tolerating treatment well.  #Anemia of CKD Lab Results  Component Value Date   HGB 8.8 (L) 10/12/2020   Epogen with dialysis treatments  # Secondary Hyperparathyroidism Calcium 9.2 Phos 4.1 No indication for phos binders     LOS: 12 [name] 10/29/20214:04 PM

## 2020-10-12 NOTE — Plan of Care (Signed)

## 2020-10-12 NOTE — Progress Notes (Signed)
Mobility Specialist - Progress Note   10/12/20 1502  Mobility  Activity Ambulated in room;Ambulated in hall  Level of Assistance Standby assist, set-up cues, supervision of patient - no hands on (CGA for safety)  Assistive Device Front wheel walker  Distance Ambulated (ft) 420 ft  Mobility Response Tolerated well  Mobility performed by Mobility specialist  $Mobility charge 1 Mobility    Pre-mobility: 79 HR, 95% SpO2 During mobility: 103 HR, 93% SpO2 Post-mobility: 92 HR, 97% SpO2   Pt sitting on recliner upon arrival. Pt very agreeable to session. Pt S2S SBA. Pt states he has pain in his back during S2S. Severity of pain not specified. Pt ambulated 14' total in room and hallway using a RW w/ SBA. CGA utilized for safety. No LOB noted. No c/o SOB. Back pain relieved while ambulating. O2 sat > 92% t/o session. Pt on RA. Overall, pt tolerated session well. Pt pleasant and motivated t/o session. At the end of session, pt curious if he could get a "lifted chair" at dc. Nurse was notified of performance and pt's request. Pt left sitting on recliner w/ wife present in room. All needs placed in reach.     Aranza Geddes Mobility Specialist  10/12/20, 3:08 PM

## 2020-10-12 NOTE — Discharge Summary (Signed)
Physician Discharge Summary  [name] DYJ:[id number] DOB: 07/15/1946 DOA: 09/30/2020  PCP: Derinda Late, MD  Admit date: 09/30/2020 Discharge date: 10/12/2020  Recommendations for Outpatient Follow-up:  1. The patient will discharge to Home with home health PT/OT/RN 2. Follow up with nephrology as directed. 3. Keep TTS schedule for dialysis on Tuesday of next week. 4. Follow up with PCP in 7-10 days after discharge.   Discharge Diagnoses: Principal diagnosis is #1 1. AKI on CKD IIIb 2. Single kidney 3. Hyperkalemia 4. Metabolic encephalopathy secondary to acute illness and uremia 5. DM II 6. Anemia of chronic kidney disease 7. Debility 8. COPD 9. Chronic diarrhea 10. Chronic back pain  Discharge Condition: Fair  Disposition: Home with home health PT/OT/RN  Diet recommendation: Heart healthy/Modified carbohydrates.  Filed Weights   10/10/20 0402 10/11/20 0452 10/12/20 0437  Weight: 113.2 kg 112.4 kg 113.3 kg    History of present illness: [name] is a 74 y.o. male with medical history significant for DM, HTN, GERD, renal cell carcinoma status post left nephrectomy 2004, history of DVT on Eliquis, depression, COPD, chronic back pain, CKD stage IIIa, anemia of CKD, recently diagnosed with pancreatic insufficiency after work-up for several weeks of  diarrhea, indigestion and bloating with plans to start on Creon who presents to the emergency room on referral from his PCP for abnormal blood work i related to his renal function after initially presenting with weakness in his lower extremities, stating his legs were feeling like spaghetti..  Patient denies abdominal pain and dysuria.  Has not yet started taking Creon.  Loose stool has not recently worsened.  Denies fever or chills.  Denies cough or shortness of breath and denies chest pain ED Course: On arrival, patient was afebrile, BP 127/56, pulse 93 O2 sat 97% on room air.  Blood work remarkable for  creatinine of 6.55, up from 1.676 months prior with potassium of 5.8 and serum bicarb of 12.  Blood glucose 66.  Hemoglobin 9.7.  EKG as reviewed by me : Normal sinus rhythm with no acute ST-T wave changes Patient was treated with D50 and was eventually placed on D10 in the ER due to persistent hypoglycemia.  CT abdomen was ordered to evaluate for obstruction.  Results still pending.  Urinalysis still pending.  Hospitalist consulted for admission.   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 He was referred to the emergency room by his PCP because of lower extremity weakness and acute kidney injury. He was found to have acute kidney injury complicated by hyperkalemia, metabolic acidosis, uremic encephalopathy, myoclonic jerks, hyperphosphatemia and hypocalcemia. He was treated with IV sodium bicarbonate infusion. Nephrologist was consulted to assist with management. Patient did have worsening kidney function with fluid overload and hypoxic respiratory failure. Vascular surgery was consulted for a temporary dialysis catheter placement. Patient underwent hemodialysis with improvement in his metabolic encephalopathy.  Plan is for discharge to SNF with continuation of dialysis as outpatient on TTS schedule.  Today's assessment: S: The patient is resting comfortably. No new complaints. O: Vitals:  Vitals:   10/12/20 0437 10/12/20 0806  BP: 135/68 132/66  Pulse: 68 70  Resp: 18 18  Temp: 98.6 F (37 C) 98.3 F (36.8 C)  SpO2: 95% 93%  Exam:  Constitutional:   The patient is awake, alert, and oriented x 3. No acute distress. Respiratory:    No increased work of breathing.  No wheezes, rales, or rhonchi  No tactile fremitus Cardiovascular:   Regular rate and rhythm  No murmurs, ectopy, or gallups.  No lateral PMI. No thrills. Abdomen:   Abdomen is soft, non-tender, non-distended  No hernias, masses, or organomegaly  Normoactive bowel sounds.  Musculoskeletal:   No cyanosis, clubbing, or edema Skin:   No rashes, lesions, ulcers  palpation of skin: no induration or nodules Neurologic:   CN 2-12 intact  Sensation all 4 extremities intact Psychiatric:   Mental status ? Mood, affect appropriate ? Orientation to person, place, time   judgment and insight appear intact  Discharge Instructions    Allergies as of 10/12/2020      Reactions   Bupropion    Other reaction(s): Other (See Comments) Mood swings, angry      Medication List    STOP taking these medications   furosemide 40 MG tablet Commonly known as: LASIX   Klor-Con M20 20 MEQ tablet Generic drug: potassium chloride SA   multivitamin tablet   oxyCODONE-acetaminophen 10-325 MG tablet Commonly known as: PERCOCET   triamcinolone cream 0.1 % Commonly known as: KENALOG     TAKE these medications   albuterol 108 (90 Base) MCG/ACT inhaler Commonly known as: VENTOLIN HFA Inhale 2 puffs into the lungs every 6 (six) hours as needed for wheezing.   apixaban 2.5 MG Tabs tablet Commonly known as: ELIQUIS Take 1 tablet (2.5 mg total) by mouth 2 (two) times daily. What changed:   medication strength  how much to take  when to take this   carvedilol 6.25 MG tablet Commonly known as: COREG Take 6.25 mg by mouth 2 (two) times daily with a meal.   clonazePAM 0.5 MG tablet Commonly known as: KLONOPIN Take 0.5 mg by mouth 2 (two) times daily as needed.   DULoxetine 60 MG capsule Commonly known as: CYMBALTA Take 120 mg by mouth daily.   feeding supplement (NEPRO CARB STEADY) Liqd Take 237 mLs by mouth 2 (two) times daily  between meals. Start taking on: October 13, 2020   gabapentin 300 MG capsule Commonly known as: NEURONTIN Take 300 mg by mouth 3 (three) times daily.   glipiZIDE 10 MG 24 hr tablet Commonly known as: GLUCOTROL XL Take 10 mg by mouth daily.   lipase/protease/amylase 36000 UNITS Cpep capsule Commonly known as: CREON Take 2 capsules by mouth 3 (three) times daily with meals.   mirtazapine 45 MG tablet Commonly known as: REMERON Take 45 mg by mouth at bedtime.   multivitamin Tabs tablet Take 1 tablet by mouth at bedtime.   oxyCODONE 5 MG immediate release tablet Commonly known as: Oxy IR/ROXICODONE Take 1 tablet (5 mg total) by mouth every 8 (eight) hours as needed for severe pain.   pantoprazole 40 MG tablet Commonly known as: PROTONIX Take 40 mg by mouth 2 (two) times daily.   rOPINIRole 4 MG tablet Commonly known as: REQUIP Take 1 tablet (4 mg total) by mouth 2 (two) times daily before a meal. What changed:   medication strength  how much to take  when to take this   sucralfate 1 g tablet Commonly known as: CARAFATE Take 1 g by mouth 3 (three) times daily.   thiamine 100 MG tablet Take 1 tablet (100 mg total) by mouth daily. Start taking on: October 13, 2020  Durable Medical Equipment  (From admission, onward)         Start     Ordered   10/12/20 1027  For home use only DME 4 wheeled rolling walker with seat  Once       Question:  Patient needs a walker to treat with the following condition  Answer:  Weakness   10/12/20 1027          Allergies  Allergen Reactions  . Bupropion     Other reaction(s): Other (See Comments) Mood swings, angry    The results of significant diagnostics from this hospitalization (including imaging, microbiology, ancillary and laboratory) are listed below for reference.    Significant Diagnostic Studies: CT Abdomen Pelvis Wo Contrast  Result Date: 10/01/2020 CLINICAL DATA:  New renal failure. To rule  out obstruction. Weakness in the leg starting a couple of days ago. EXAM: CT ABDOMEN AND PELVIS WITHOUT CONTRAST TECHNIQUE: Multidetector CT imaging of the abdomen and pelvis was performed following the standard protocol without IV contrast. COMPARISON:  MRCP 09/14/2020 FINDINGS: Lower chest: Atelectasis in the lung bases. Residual contrast material in the esophagus may represent reflux or dysmotility. No esophageal dilatation. Mild cardiac enlargement. Coronary artery calcifications. Hepatobiliary: Unenhanced images of the liver are unremarkable. No focal lesions identified. Gallbladder is surgically absent. No bile duct dilatation. Pancreas: Unremarkable. No pancreatic ductal dilatation or surrounding inflammatory changes. Spleen: Normal in size without focal abnormality. Adrenals/Urinary Tract: Left adrenal gland is surgically absent. Right adrenal gland nodule measuring 2.3 cm diameter. Density measurements are 1.4 Hounsfield units consistent with a benign adenoma. Surgical absence of the left kidney. Cyst on the right kidney. No hydronephrosis or hydroureter. No ureteral stones or bladder stones. No bladder wall thickening or filling defect. Stomach/Bowel: Postoperative changes consistent with gastric bypass. Fluid in the excluded stomach could arise from reflux or staple line dehiscence. Dehiscence is less likely as no contrast material is demonstrated. Small bowel and colon are not abnormally distended. No inflammatory changes are identified. The appendix is normal. Vascular/Lymphatic: Aortic atherosclerosis. No enlarged abdominal or pelvic lymph nodes. Reproductive: Prostate is unremarkable. Other: Small amount of free fluid around the liver possibly representing ascites. No free air. Abdominal wall musculature appears intact. Musculoskeletal: Degenerative changes in the spine and hips. No destructive bone lesions. IMPRESSION: 1. No evidence of bowel obstruction or inflammation. 2. Postoperative changes  consistent with gastric bypass. Fluid in the excluded stomach could arise from reflux or staple line dehiscence. Dehiscence is less likely as no contrast material is demonstrated. 3. Small amount of free fluid around the liver possibly representing ascites. 4. Right adrenal gland nodule consistent with a benign adenoma. 5. 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 No right renal or ureteral stone or obstruction. 6. Residual contrast material in the esophagus may represent reflux or dysmotility. 7. Aortic atherosclerosis. Aortic Atherosclerosis (ICD10-I70.0). Electronically Signed   By: Lucienne Capers M.D.   On: 10/01/2020 01:07   CT HEAD WO CONTRAST  Result Date: 10/03/2020 CLINICAL DATA:  Altered mental status. EXAM: CT HEAD WITHOUT CONTRAST TECHNIQUE: Contiguous axial images were obtained from the base of the skull through the vertex without intravenous contrast. COMPARISON:  01/24/2019 FINDINGS: Brain: Motion and streak artifact limit examination. Ventricles and sulci are symmetrical. No mass effect or midline shift. No abnormal extra-axial fluid collections. Gray-white matter junctions are distinct. Basal cisterns are not effaced. No acute intracranial hemorrhage. Vascular: Mild intracranial arterial vascular calcifications. Skull: Calvarium appears intact. Sinuses/Orbits: Mucosal thickening in  the paranasal sinuses. No acute air-fluid levels. Mastoid air cells are clear. Other: None. IMPRESSION: No acute intracranial abnormalities. Electronically Signed   By: Lucienne Capers M.D.   On: 10/03/2020 01:05   MR ABDOMEN MRCP WO CONTRAST  Result Date: 09/14/2020 CLINICAL DATA:  Nausea, diarrhea, occasional vomiting for 6 months, history of cholecystectomy EXAM: MRI ABDOMEN WITHOUT CONTRAST  (INCLUDING MRCP) TECHNIQUE: Multiplanar multisequence MR imaging of the abdomen was performed. Heavily T2-weighted images of the biliary and pancreatic ducts were obtained, and three-dimensional MRCP  images were rendered by post processing. COMPARISON:  MR abdomen, 01/18/2012 FINDINGS: Lower chest: No acute findings. Hepatobiliary: No mass or other parenchymal abnormality identified. Status post cholecystectomy. There is mild intra and extrahepatic biliary ductal dilatation, the central common bile duct measuring up to 1.0 cm. Pancreas: No mass, inflammatory changes, or other parenchymal abnormality identified. Mild dilatation of the pancreatic duct to the ampulla, measuring up to 6 mm centrally. Spleen:  Within normal limits in size and appearance. Adrenals/Urinary Tract: Status post left nephrectomy. Right renal cysts. No masses identified. No evidence of hydronephrosis. Stomach/Bowel: Status post Roux type gastric bypass. Visualized portions within the abdomen are otherwise unremarkable. Vascular/Lymphatic: No pathologically enlarged lymph nodes identified. No abdominal aortic aneurysm demonstrated. Other:  None. Musculoskeletal: No suspicious bone lesions identified. IMPRESSION: 1. Status post cholecystectomy with mild intra- and extrahepatic biliary ductal dilatation, the central common bile duct measuring up to 1.0 cm. This is likely postoperative in the absence of biochemical evidence of ductal obstruction. No noncontrast evidence of post cholecystectomy choledocholithiasis or other obstructing lesion. 2. Mild dilatation of the pancreatic duct to the ampulla, measuring up to 6 mm centrally, nonspecific and again without noncontrast evidence of obstructing mass or other lesion. 3. Status post left nephrectomy. 4. Status post Roux type gastric bypass. Electronically Signed   By: Eddie Candle M.D.   On: 09/14/2020 10:21   PERIPHERAL VASCULAR CATHETERIZATION  Result Date: 10/09/2020 See Op Note  PERIPHERAL VASCULAR CATHETERIZATION  Result Date: 10/02/2020 See Op Note  MR 3D Recon At Scanner  Result Date: 09/14/2020 CLINICAL DATA:  Nausea, diarrhea, occasional vomiting for 6 months, history of  cholecystectomy EXAM: MRI ABDOMEN WITHOUT CONTRAST  (INCLUDING MRCP) TECHNIQUE: Multiplanar multisequence MR imaging of the abdomen was performed. Heavily T2-weighted images of the biliary and pancreatic ducts were obtained, and three-dimensional MRCP images were rendered by post processing. COMPARISON:  MR abdomen, 01/18/2012 FINDINGS: Lower chest: No acute findings. Hepatobiliary: No mass or other parenchymal abnormality identified. Status post cholecystectomy. There is mild intra and extrahepatic biliary ductal dilatation, the central common bile duct measuring up to 1.0 cm. Pancreas: No mass, inflammatory changes, or other parenchymal abnormality identified. Mild dilatation of the pancreatic duct to the ampulla, measuring up to 6 mm centrally. Spleen:  Within normal limits in size and appearance. Adrenals/Urinary Tract: Status post left nephrectomy. Right renal cysts. No masses identified. No evidence of hydronephrosis. Stomach/Bowel: Status post Roux type gastric bypass. Visualized portions within the abdomen are otherwise unremarkable. Vascular/Lymphatic: No pathologically enlarged lymph nodes identified. No abdominal aortic aneurysm demonstrated. Other:  None. Musculoskeletal: No suspicious bone lesions identified. IMPRESSION: 1. Status post cholecystectomy with mild intra- and extrahepatic biliary ductal dilatation, the central common bile duct measuring up to 1.0 cm. This is likely postoperative in the absence of biochemical evidence of ductal obstruction. No noncontrast evidence of post cholecystectomy choledocholithiasis or other obstructing lesion. 2. Mild dilatation of the pancreatic duct to the ampulla, measuring up to 6 mm centrally, nonspecific  and again without noncontrast evidence of obstructing mass or other lesion. 3. Status post left nephrectomy. 4. Status post Roux type gastric bypass. Electronically Signed   By: Eddie Candle M.D.   On: 09/14/2020 10:21   DG Chest Port 1 View  Result Date:  10/07/2020 CLINICAL DATA:  Hypoxia. EXAM: PORTABLE CHEST 1 VIEW COMPARISON:  October 02, 2020 FINDINGS: Very mild, chronic appearing increased bronchovascular lung markings are seen. Very mild residual left basilar atelectasis and/or infiltrate is noted. There is no evidence of a pleural effusion or pneumothorax. The cardiac silhouette is mildly enlarged and unchanged in size. Degenerative changes seen throughout the thoracic spine. IMPRESSION: Very mild residual left basilar atelectasis and/or infiltrate. Electronically Signed   By: Virgina Norfolk M.D.   On: 10/07/2020 16:28   DG Chest Port 1 View  Result Date: 10/02/2020 CLINICAL DATA:  Fever. EXAM: PORTABLE CHEST 1 VIEW COMPARISON:  09/30/2020 chest radiograph and prior. FINDINGS: Left predominant bibasilar opacities. No pneumothorax. Trace left pleural effusion. Cardiomediastinal silhouette is unchanged. No acute osseous abnormality. IMPRESSION: Left predominant bibasilar opacities, atelectasis versus infection. Trace left pleural effusion. Electronically Signed   By: Primitivo Gauze M.D.   On: 10/02/2020 13:03   DG Chest Port 1 View  Result Date: 09/30/2020 CLINICAL DATA:  74 year old male with cough. Lower extremity weakness. Former smoker. EXAM: PORTABLE CHEST 1 VIEW COMPARISON:  Portable chest 03/25/2020 and earlier. FINDINGS: Portable AP semi upright view at 2148 hours. Stable lung volumes and mediastinal contours. Cardiac size within normal limits. Visualized tracheal air column is within normal limits. Resolved patchy left lung opacity since April. Underlying increased pulmonary interstitium not significantly changed. No pneumothorax, pulmonary edema, pleural effusion or new pulmonary opacity. There is a new electronic device projecting over the right cardiophrenic angle of unclear significance. No acute osseous abnormality identified. IMPRESSION: No acute cardiopulmonary abnormality. Electronically Signed   By: Genevie Ann M.D.   On:  09/30/2020 21:57    Microbiology: Recent Results (from the past 240 hour(s))  CULTURE, BLOOD (ROUTINE X 2) w Reflex to ID Panel     Status: None   Collection Time: 10/02/20  1:16 PM   Specimen: BLOOD  Result Value Ref Range Status   Specimen Description BLOOD LEFT ANTECUBITAL  Final   Special Requests   Final    BOTTLES DRAWN AEROBIC AND ANAEROBIC Blood Culture adequate volume   Culture   Final    NO GROWTH 5 DAYS Performed at [facility], [address] [place], [place] [address]    Report Status 10/07/2020 FINAL  Final  CULTURE, BLOOD (ROUTINE X 2) w Reflex to ID Panel     Status: None   Collection Time: 10/02/20  1:33 PM   Specimen: BLOOD  Result Value Ref Range Status   Specimen Description BLOOD BLOOD LEFT HAND  Final   Special Requests   Final    BOTTLES DRAWN AEROBIC AND ANAEROBIC Blood Culture adequate volume   Culture   Final    NO GROWTH 5 DAYS Performed at [facility], [address] [place], [place] [address]    Report Status 10/07/2020 FINAL  Final     Labs: Basic Metabolic Panel: Recent Labs  Lab 10/06/20 0500 10/06/20 0500 10/07/20 0529 10/07/20 0529 10/08/20 0631 10/09/20 0445 10/10/20 0500 10/10/20 1106 10/11/20 0409 10/11/20 0753 10/12/20 0548  NA 143   < > 141   < > 142 139 139  --  140  --  139  K 3.6   < >  3.5   < > 4.8 4.1 3.7  --  3.7  --  4.2  CL 106   < > 105   < > 105 106 102  --  100  --  101  CO2 26   < > 27   < > 25 23 25   --  30  --  27  GLUCOSE 167*   < > 144*   < > 203* 211* 195*  --  198*  --  260*  BUN 59*   < > 41*   < > 56* 69* 51*  --  37*  --  50*  CREATININE 6.20*   < > 4.70*   < > 5.84* 6.71* 5.28*  --  4.18*  --  5.02*  CALCIUM 7.2*   < > 8.0*   < > 8.6* 8.1* 8.5*  --  8.7*  --  9.2  MG 2.0  --  2.0  --  2.2 2.2 2.0  --   --   --   --   PHOS 6.3*  --  5.0*  --   --   --   --  3.2  --  4.9*  --    < > = values in this interval not displayed.   Liver Function Tests: Recent Labs  Lab  10/10/20 0500  AST 16  ALT 16  ALKPHOS 98  BILITOT 0.7  PROT 6.3*  ALBUMIN 3.1*   No results for input(s): LIPASE, AMYLASE in the last 168 hours. No results for input(s): AMMONIA in the last 168 hours. CBC: Recent Labs  Lab 10/08/20 0631 10/09/20 0445 10/10/20 0500 10/11/20 0409 10/12/20 0548  WBC 7.1 7.5 7.8 5.5 6.0  NEUTROABS  --   --   --  3.8 4.3  HGB 8.4* 8.1* 8.3* 8.1* 8.8*  HCT 26.3* 25.5* 26.3* 25.4* 27.7*  MCV 97.8 97.3 96.0 96.6 97.5  PLT 195 190 208 184 182   Cardiac Enzymes: No results for input(s): CKTOTAL, CKMB, CKMBINDEX, TROPONINI in the last 168 hours. BNP: BNP (last 3 results) Recent Labs    03/25/20 0557  BNP 31.0    ProBNP (last 3 results) No results for input(s): PROBNP in the last 8760 hours.  CBG: Recent Labs  Lab 10/10/20 2125 10/11/20 0818 10/11/20 1631 10/11/20 2018 10/12/20 0805  GLUCAP 190* 157* 176* 218* 202*    Principal Problem:   AKI (acute kidney injury) ([place]) Active Problems:   Essential hypertension   Type II diabetes mellitus with renal manifestations (HCC)   Anemia in chronic kidney disease   Spinal stenosis, lumbar region, with neurogenic claudication   Hyperkalemia   COPD (chronic obstructive pulmonary disease) ([place])   CKD stage 3 due to type 2 diabetes mellitus ([place])   History of renal cell carcinoma   History of left nephrectomy 2004   Hypoglycemia   Pancreatic insufficiency   Chronic diarrhea   Metabolic acidosis   Malnutrition of moderate degree   Time coordinating discharge: 38 minutes.  Signed:        Farha Dano, DO [facility]  10/12/2020, 9:14 AM

## 2020-10-12 NOTE — TOC Transition Note (Signed)
Transition of Care [facility]) - CM/SW Discharge Note   Patient Details  Name: [name] MRN: [id number] Date of Birth: December 24, 1945  Transition of Care [facility]) CM/SW Contact:  Victorino Dike, RN Phone Number: 10/12/2020, 2:41 PM   Clinical Narrative:     Patient to return home with wife today.  DME Walker with Seat delivered to room.  Home Health set up with [facility] PT/OT/RN.  Patient new HD, will start outpatient HD on Tuesday wife is aware and understands.      Barriers to Discharge: Barriers Resolved   Patient Goals and CMS Choice Patient states their goals for this hospitalization and ongoing recovery are:: To return home with wife CMS Medicare.gov Compare Post Acute Care list provided to:: Patient Choice offered to / list presented to : Patient  Discharge Placement   Discharge Plan and Services   Discharge Planning Services: CM Consult Post Acute Care Choice: Home Health          DME Arranged: Walker rolling with seat         HH Arranged: RN, PT, OT HH Agency: [facility] (now [facility]) Date [place]: 10/12/20 Time [place]: [place] ([place]) Interventions     Readmission Risk Interventions Readmission Risk Prevention Plan 10/08/2020 07/08/2019  Transportation Screening Complete Complete  PCP or Specialist Appt within 3-5 Days Complete Complete  HRI or [place] Complete Complete  Social Work Consult for [place] Planning/Counseling Complete Complete  Palliative Care Screening Not Applicable Not Applicable  Medication Review Press photographer) Complete Complete  Some recent data might be hidden

## 2020-10-12 NOTE — Progress Notes (Signed)
Mobility Specialist - Progress Note   10/12/20 1056  Mobility  Activity Contraindicated/medical hold  Mobility performed by Mobility specialist    Pt currently out of room for dialysis. Will hold and re-attempt session when pt is available.    Janai Maudlin Mobility Specialist  10/12/20, 10:57 AM

## 2020-10-12 NOTE — Care Management Important Message (Signed)
Important Message  Patient Details  Name: [name] MRN: [id number] Date of Birth: [birth year]   Medicare Important Message Given:  Yes     Dannette Barbara 10/12/2020, 12:59 PM

## 2020-10-16 ENCOUNTER — Telehealth: Payer: Self-pay | Admitting: *Deleted

## 2020-10-16 LAB — VITAMIN B1: Vitamin B1 (Thiamine): 149.2 nmol/L (ref 66.5–200.0)

## 2020-10-16 NOTE — Telephone Encounter (Signed)
Attempted to call both numbers for pre appointment review of allergies/meds. Message left.

## 2020-10-17 ENCOUNTER — Encounter: Payer: Self-pay | Admitting: Student in an Organized Health Care Education/Training Program

## 2020-10-17 ENCOUNTER — Other Ambulatory Visit: Payer: Self-pay

## 2020-10-17 ENCOUNTER — Telehealth: Payer: Self-pay | Admitting: *Deleted

## 2020-10-17 ENCOUNTER — Ambulatory Visit
Payer: Medicare Other | Attending: Student in an Organized Health Care Education/Training Program | Admitting: Student in an Organized Health Care Education/Training Program

## 2020-10-17 DIAGNOSIS — M5416 Radiculopathy, lumbar region: Secondary | ICD-10-CM

## 2020-10-17 NOTE — Progress Notes (Signed)
I attempted to call the patient however no response. Voicemail left instructing patient to call front desk office at [phone] to reschedule appointment. -Dr Rayquon Uselman  

## 2020-10-17 NOTE — Telephone Encounter (Signed)
Attempted to call for pre appointment review of allergies/meds. Message left. 

## 2020-10-18 ENCOUNTER — Encounter: Payer: Medicare Other | Admitting: Student in an Organized Health Care Education/Training Program

## 2020-10-22 ENCOUNTER — Other Ambulatory Visit: Payer: Self-pay | Admitting: Physician Assistant

## 2020-10-22 ENCOUNTER — Other Ambulatory Visit (HOSPITAL_COMMUNITY): Payer: Self-pay | Admitting: Physician Assistant

## 2020-10-22 DIAGNOSIS — H903 Sensorineural hearing loss, bilateral: Secondary | ICD-10-CM

## 2020-10-24 ENCOUNTER — Telehealth: Payer: Self-pay | Admitting: Student in an Organized Health Care Education/Training Program

## 2020-10-24 NOTE — Telephone Encounter (Signed)
Patient just got out of hospital w/kidney failure. Dr. Holley Raring prescribes Oxycodone. Hospital changed to Oxycodone HCF time released. They told him not to take what was subscribed by Dr. Holley Raring. He needs to script sent in for this as he only has 3 left. Cannot have any tylenol in it.

## 2020-10-24 NOTE — Telephone Encounter (Signed)
He needs either a F2F visit or VV to discuss. I need to better understand what happened and why that recommendation was made after hospital discharge

## 2020-10-25 NOTE — Telephone Encounter (Signed)
Scheduled a VV for Monday

## 2020-10-29 ENCOUNTER — Encounter: Payer: Self-pay | Admitting: Student in an Organized Health Care Education/Training Program

## 2020-10-29 ENCOUNTER — Other Ambulatory Visit: Payer: Self-pay

## 2020-10-29 ENCOUNTER — Ambulatory Visit
Payer: Medicare Other | Attending: Student in an Organized Health Care Education/Training Program | Admitting: Student in an Organized Health Care Education/Training Program

## 2020-10-29 DIAGNOSIS — M47816 Spondylosis without myelopathy or radiculopathy, lumbar region: Secondary | ICD-10-CM

## 2020-10-29 DIAGNOSIS — M5416 Radiculopathy, lumbar region: Secondary | ICD-10-CM | POA: Diagnosis not present

## 2020-10-29 DIAGNOSIS — M48062 Spinal stenosis, lumbar region with neurogenic claudication: Secondary | ICD-10-CM | POA: Diagnosis not present

## 2020-10-29 DIAGNOSIS — M48061 Spinal stenosis, lumbar region without neurogenic claudication: Secondary | ICD-10-CM

## 2020-10-29 DIAGNOSIS — M5136 Other intervertebral disc degeneration, lumbar region: Secondary | ICD-10-CM | POA: Diagnosis not present

## 2020-10-29 DIAGNOSIS — M4326 Fusion of spine, lumbar region: Secondary | ICD-10-CM

## 2020-10-29 DIAGNOSIS — G894 Chronic pain syndrome: Secondary | ICD-10-CM

## 2020-10-29 MED ORDER — OXYCODONE HCL 5 MG PO TABS: 5.0000 mg | ORAL_TABLET | Freq: Three times a day (TID) | ORAL | 0 refills | Status: DC | PRN

## 2020-10-29 MED ORDER — OXYCODONE HCL 5 MG PO TABS: 5.0000 mg | ORAL_TABLET | Freq: Three times a day (TID) | ORAL | 0 refills | Status: AC | PRN

## 2020-10-29 NOTE — Progress Notes (Signed)
Patient: [name]  Service Category: E/M  Provider: Gillis Santa, MD  DOB: [birth year]  DOS: 10/29/2020  Location: Office  MRN: [id number]  Setting: Ambulatory outpatient  Referring Provider: Derinda Late, MD  Type: Established Patient  Specialty: Interventional Pain Management  PCP: Derinda Late, MD  Location: Home  Delivery: TeleHealth     Virtual Encounter - Pain Management PROVIDER NOTE: Information contained herein reflects review and annotations entered in association with encounter. Interpretation of such information and data should be left to medically-trained personnel. Information provided to patient can be located elsewhere in the medical record under "Patient Instructions". Document created using STT-dictation technology, any transcriptional errors that may result from process are unintentional.    Contact & Pharmacy Preferred: [phone] Home: [phone] (home) Mobile: [phone] (mobile) E-mail: No e-mail address on record  [id number]: [phone]: [phone]  Pre-screening  Mr. CMegan Salonoffered "in-person" vs "virtual" encounter. He indicated preferring virtual for this encounter.   Reason COVID-19*  Social distancing based on CDC and AMA recommendations.   I contacted [name] 10/29/2020 via video conference.      I clearly identified myself as BGillis Santa MD. I verified that I was speaking with the correct person using two identifiers (Name: RNoris Kulinski and date of birth: [birth year].  Consent I sought verbal advanced consent from Curtis Constablefor virtual visit interactions. I informed Mr. CLacivitaof possible security and privacy concerns, risks, and limitations associated with providing "not-in-person" medical evaluation and management services. I also informed Mr. CBienvenueof the availability of "in-person"  appointments. Finally, I informed him that there would be a charge for the virtual visit and that he could be  personally, fully or partially, financially responsible for it. Mr. CBastidasexpressed understanding and agreed to proceed.   Historic Elements   Mr. RMorgen Ritaccois a 74y.o. year old, male patient evaluated today after our last contact on 10/24/2020. Mr. CAble has a past medical history of Chronic back pain, COPD (chronic obstructive pulmonary disease) ([place], Depression, Diabetes mellitus without complication ([place], Hyperlipidemia, Hypertension, Renal cell carcinoma ([place], and Sleep apnea. He also  has a past surgical history that includes colonoscopys; Upper gi endoscopy; Flexible sigmoidoscopy; Gastric bypass; Nephrectomy; open reduction and internal fixation, right distal radius; Carpal tunnel release; right knee arthroscopy; Esophagogastroduodenoscopy (egd) with propofol (N/A, 07/29/2017); Colonoscopy with propofol (N/A, 07/29/2017); Cholecystectomy; TEMPORARY DIALYSIS CATHETER (N/A, 10/02/2020); and DIALYSIS/PERMA CATHETER INSERTION (N/A, 10/09/2020). Mr. CNevillshas a current medication list which includes the following prescription(s): albuterol, apixaban, carvedilol, clonazepam, duloxetine, gabapentin, glipizide, lipase/protease/amylase, mirtazapine, multivitamin, feeding supplement (nepro carb steady), [START ON 11/02/2020] oxycodone, [START ON 12/02/2020] oxycodone, pantoprazole, ropinirole, sucralfate, and thiamine. He  reports that he has quit smoking. He has a 30.00 pack-year smoking history. His smokeless tobacco use includes chew. He reports that he does not drink alcohol and does not use drugs. Mr. CConleeis allergic to bupropion.   HPI  Today, he is being contacted for medication management.   Since the patient's last visit with me, he was hospitalized from 09/30/2020 to 10/12/2020 for acute on chronic kidney disease in the context of having a single kidney.  Patient  was treated with IV sodium bicarbonate infusion, nephrology was consulted for fluid overload and hypoxic respiratory failure.  Vascular surgery placed a temporary dialysis catheter.  Patient underwent hemodialysis.  He was discharged to SNF.  He  will continue dialysis.  He states that he was told upon hospitalization to discontinue his Percocet and future medications should be oxycodone without acetaminophen.  This is reasonable.  Will transition him to oxycodone as below at 5 mg 3 times a day as needed.  Patient will follow up with me in 2 months.  Continue physical therapy exercise as tolerated.  Continue follow-up with nephrology.  Pharmacotherapy Assessment  Analgesic: Will transition from Percocet 10 mg 3 times daily as needed to oxycodone 5 mg 3 times daily as needed}   Monitoring: [place] PMP: PDMP reviewed during this encounter.       Pharmacotherapy: No side-effects or adverse reactions reported. Compliance: No problems identified. Effectiveness: Clinically acceptable. Plan: Refer to "POC".   Laboratory Chemistry Profile   Renal Lab Results  Component Value Date   BUN 50 (H) 10/12/2020   CREATININE 5.02 (H) 10/12/2020   GFRAA 47 (L) 03/26/2020   GFRNONAA 12 (L) 10/12/2020     Hepatic Lab Results  Component Value Date   AST 16 10/10/2020   ALT 16 10/10/2020   ALBUMIN 3.1 (L) 10/10/2020   ALKPHOS 98 10/10/2020   HCVAB NON REACTIVE 10/02/2020   HCVAB NON REACTIVE 10/02/2020   LIPASE 209 02/12/2014     Electrolytes Lab Results  Component Value Date   NA 139 10/12/2020   K 4.2 10/12/2020   CL 101 10/12/2020   CALCIUM 9.2 10/12/2020   MG 2.0 10/10/2020   PHOS 4.1 10/12/2020     Bone No results found for: VD25OH, VD125OH2TOT, GY1749SW9, QP5916BW4, 25OHVITD1, 25OHVITD2, 25OHVITD3, TESTOFREE, TESTOSTERONE   Inflammation (CRP: Acute Phase) (ESR: Chronic Phase) Lab Results  Component Value Date   LATICACIDVEN 0.5 10/02/2020       Note: Above Lab results  reviewed.   Assessment  The primary encounter diagnosis was Lumbar radiculopathy. Diagnoses of Bilateral stenosis of lateral recess of lumbar spine, Neuroforaminal stenosis of lumbar spine, Spinal stenosis, lumbar region, with neurogenic claudication, Lumbar degenerative disc disease, Ankylosis of lumbar spine, Lumbar facet arthropathy, and Chronic pain syndrome were also pertinent to this visit.  Plan of Care   Pharmacotherapy (Medications Ordered): Meds ordered this encounter  Medications  . oxyCODONE (OXY IR/ROXICODONE) 5 MG immediate release tablet    Sig: Take 1 tablet (5 mg total) by mouth every 8 (eight) hours as needed for severe pain. For chronic pain syndrome    Dispense:  90 tablet    Refill:  0  . oxyCODONE (OXY IR/ROXICODONE) 5 MG immediate release tablet    Sig: Take 1 tablet (5 mg total) by mouth every 8 (eight) hours as needed for severe pain. For chronic pain syndrome    Dispense:  90 tablet    Refill:  0   Follow-up plan:   Return in about 8 weeks (around 12/27/2020) for Medication Management, in person.   Recent Visits Date Type Provider Dept  08/30/20 Telemedicine Gillis Santa, MD [facility]  Showing recent visits within past 90 days and meeting all other requirements Today's Visits Date Type Provider Dept  10/29/20 Telemedicine Gillis Santa, MD [facility]  Showing today's visits and meeting all other requirements Future Appointments No visits were found meeting these conditions. Showing future appointments within next 90 days and meeting all other requirements  I discussed the assessment and treatment plan with the patient. The patient was provided an opportunity to ask questions and all were answered. The patient agreed with the plan and demonstrated an understanding of the instructions.  Patient advised to call back or seek an in-person evaluation if the symptoms or condition worsens.  Duration of encounter:30 minutes.  Note by:  Gillis Santa, MD Date: 10/29/2020; Time: 3:27 PM

## 2020-11-05 ENCOUNTER — Ambulatory Visit
Admission: RE | Admit: 2020-11-05 | Discharge: 2020-11-05 | Disposition: A | Discharge status: Home / Self Care | Payer: Medicare Other | Source: Ambulatory Visit | Attending: Physician Assistant | Admitting: Physician Assistant

## 2020-11-05 ENCOUNTER — Other Ambulatory Visit: Payer: Self-pay

## 2020-11-05 DIAGNOSIS — H903 Sensorineural hearing loss, bilateral: Secondary | ICD-10-CM

## 2020-11-07 ENCOUNTER — Other Ambulatory Visit: Payer: Self-pay

## 2020-11-07 ENCOUNTER — Inpatient Hospital Stay
Admission: EM | Admit: 2020-11-07 | Discharge: 2020-11-09 | DRG: 064 | Disposition: A | Discharge status: Home Health | Payer: Medicare Other | Attending: Internal Medicine | Admitting: Internal Medicine

## 2020-11-07 DIAGNOSIS — H538 Other visual disturbances: Secondary | ICD-10-CM | POA: Diagnosis present

## 2020-11-07 DIAGNOSIS — J449 Chronic obstructive pulmonary disease, unspecified: Secondary | ICD-10-CM | POA: Diagnosis present

## 2020-11-07 DIAGNOSIS — Z9884 Bariatric surgery status: Secondary | ICD-10-CM

## 2020-11-07 DIAGNOSIS — F32A Depression, unspecified: Secondary | ICD-10-CM | POA: Diagnosis present

## 2020-11-07 DIAGNOSIS — Z86718 Personal history of other venous thrombosis and embolism: Secondary | ICD-10-CM

## 2020-11-07 DIAGNOSIS — E1122 Type 2 diabetes mellitus with diabetic chronic kidney disease: Secondary | ICD-10-CM | POA: Diagnosis present

## 2020-11-07 DIAGNOSIS — I10 Essential (primary) hypertension: Secondary | ICD-10-CM | POA: Diagnosis not present

## 2020-11-07 DIAGNOSIS — R29700 NIHSS score 0: Secondary | ICD-10-CM | POA: Diagnosis present

## 2020-11-07 DIAGNOSIS — E876 Hypokalemia: Secondary | ICD-10-CM | POA: Diagnosis present

## 2020-11-07 DIAGNOSIS — D631 Anemia in chronic kidney disease: Secondary | ICD-10-CM | POA: Diagnosis present

## 2020-11-07 DIAGNOSIS — K8689 Other specified diseases of pancreas: Secondary | ICD-10-CM | POA: Diagnosis present

## 2020-11-07 DIAGNOSIS — Z833 Family history of diabetes mellitus: Secondary | ICD-10-CM

## 2020-11-07 DIAGNOSIS — N2581 Secondary hyperparathyroidism of renal origin: Secondary | ICD-10-CM | POA: Diagnosis present

## 2020-11-07 DIAGNOSIS — N186 End stage renal disease: Secondary | ICD-10-CM | POA: Diagnosis not present

## 2020-11-07 DIAGNOSIS — Z79899 Other long term (current) drug therapy: Secondary | ICD-10-CM

## 2020-11-07 DIAGNOSIS — K219 Gastro-esophageal reflux disease without esophagitis: Secondary | ICD-10-CM | POA: Diagnosis present

## 2020-11-07 DIAGNOSIS — I6350 Cerebral infarction due to unspecified occlusion or stenosis of unspecified cerebral artery: Secondary | ICD-10-CM | POA: Diagnosis present

## 2020-11-07 DIAGNOSIS — G894 Chronic pain syndrome: Secondary | ICD-10-CM | POA: Diagnosis present

## 2020-11-07 DIAGNOSIS — I452 Bifascicular block: Secondary | ICD-10-CM | POA: Diagnosis present

## 2020-11-07 DIAGNOSIS — E1142 Type 2 diabetes mellitus with diabetic polyneuropathy: Secondary | ICD-10-CM | POA: Diagnosis present

## 2020-11-07 DIAGNOSIS — Z992 Dependence on renal dialysis: Secondary | ICD-10-CM

## 2020-11-07 DIAGNOSIS — Z905 Acquired absence of kidney: Secondary | ICD-10-CM

## 2020-11-07 DIAGNOSIS — G2581 Restless legs syndrome: Secondary | ICD-10-CM | POA: Diagnosis present

## 2020-11-07 DIAGNOSIS — N189 Chronic kidney disease, unspecified: Secondary | ICD-10-CM | POA: Diagnosis present

## 2020-11-07 DIAGNOSIS — F17290 Nicotine dependence, other tobacco product, uncomplicated: Secondary | ICD-10-CM | POA: Diagnosis present

## 2020-11-07 DIAGNOSIS — I639 Cerebral infarction, unspecified: Secondary | ICD-10-CM | POA: Diagnosis present

## 2020-11-07 DIAGNOSIS — E1129 Type 2 diabetes mellitus with other diabetic kidney complication: Secondary | ICD-10-CM | POA: Diagnosis present

## 2020-11-07 DIAGNOSIS — G4733 Obstructive sleep apnea (adult) (pediatric): Secondary | ICD-10-CM | POA: Diagnosis present

## 2020-11-07 DIAGNOSIS — E785 Hyperlipidemia, unspecified: Secondary | ICD-10-CM | POA: Diagnosis present

## 2020-11-07 DIAGNOSIS — Z20822 Contact with and (suspected) exposure to covid-19: Secondary | ICD-10-CM | POA: Diagnosis present

## 2020-11-07 DIAGNOSIS — Z8249 Family history of ischemic heart disease and other diseases of the circulatory system: Secondary | ICD-10-CM

## 2020-11-07 DIAGNOSIS — I120 Hypertensive chronic kidney disease with stage 5 chronic kidney disease or end stage renal disease: Secondary | ICD-10-CM | POA: Diagnosis present

## 2020-11-07 DIAGNOSIS — H9191 Unspecified hearing loss, right ear: Secondary | ICD-10-CM | POA: Diagnosis present

## 2020-11-07 DIAGNOSIS — Z86711 Personal history of pulmonary embolism: Secondary | ICD-10-CM

## 2020-11-07 DIAGNOSIS — Z7901 Long term (current) use of anticoagulants: Secondary | ICD-10-CM

## 2020-11-07 DIAGNOSIS — Z7984 Long term (current) use of oral hypoglycemic drugs: Secondary | ICD-10-CM

## 2020-11-07 DIAGNOSIS — Z85528 Personal history of other malignant neoplasm of kidney: Secondary | ICD-10-CM | POA: Diagnosis not present

## 2020-11-07 DIAGNOSIS — Z888 Allergy status to other drugs, medicaments and biological substances status: Secondary | ICD-10-CM

## 2020-11-07 LAB — CBC
HCT: 26.4 % — ABNORMAL LOW (ref 39.0–52.0)
Hemoglobin: 8.6 g/dL — ABNORMAL LOW (ref 13.0–17.0)
MCH: 30.7 pg (ref 26.0–34.0)
MCHC: 32.6 g/dL (ref 30.0–36.0)
MCV: 94.3 fL (ref 80.0–100.0)
Platelets: 139 10*3/uL — ABNORMAL LOW (ref 150–400)
RBC: 2.8 MIL/uL — ABNORMAL LOW (ref 4.22–5.81)
RDW: 13.9 % (ref 11.5–15.5)
WBC: 8.6 10*3/uL (ref 4.0–10.5)
nRBC: 0 % (ref 0.0–0.2)

## 2020-11-07 LAB — DIFFERENTIAL
Abs Immature Granulocytes: 0.03 10*3/uL (ref 0.00–0.07)
Basophils Absolute: 0 10*3/uL (ref 0.0–0.1)
Basophils Relative: 0 %
Eosinophils Absolute: 0.2 10*3/uL (ref 0.0–0.5)
Eosinophils Relative: 2 %
Immature Granulocytes: 0 %
Lymphocytes Relative: 16 %
Lymphs Abs: 1.4 10*3/uL (ref 0.7–4.0)
Monocytes Absolute: 0.4 10*3/uL (ref 0.1–1.0)
Monocytes Relative: 5 %
Neutro Abs: 6.6 10*3/uL (ref 1.7–7.7)
Neutrophils Relative %: 77 %

## 2020-11-07 LAB — COMPREHENSIVE METABOLIC PANEL
ALT: 14 U/L (ref 0–44)
AST: 15 U/L (ref 15–41)
Albumin: 3.7 g/dL (ref 3.5–5.0)
Alkaline Phosphatase: 117 U/L (ref 38–126)
Anion gap: 12 (ref 5–15)
BUN: 47 mg/dL — ABNORMAL HIGH (ref 8–23)
CO2: 23 mmol/L (ref 22–32)
Calcium: 7.7 mg/dL — ABNORMAL LOW (ref 8.9–10.3)
Chloride: 108 mmol/L (ref 98–111)
Creatinine, Ser: 3.84 mg/dL — ABNORMAL HIGH (ref 0.61–1.24)
GFR, Estimated: 16 mL/min — ABNORMAL LOW (ref >60)
Glucose, Bld: 152 mg/dL — ABNORMAL HIGH (ref 70–99)
Potassium: 3.1 mmol/L — ABNORMAL LOW (ref 3.5–5.1)
Sodium: 143 mmol/L (ref 135–145)
Total Bilirubin: 0.4 mg/dL (ref 0.3–1.2)
Total Protein: 6.8 g/dL (ref 6.5–8.1)

## 2020-11-07 LAB — PROTIME-INR
INR: 1.1 (ref 0.8–1.2)
Prothrombin Time: 13.5 seconds (ref 11.4–15.2)

## 2020-11-07 LAB — RESP PANEL BY RT-PCR (FLU A&B, COVID) ARPGX2
Influenza A by PCR: NEGATIVE
Influenza B by PCR: NEGATIVE
SARS Coronavirus 2 by RT PCR: NEGATIVE

## 2020-11-07 LAB — APTT: aPTT: 38 seconds — ABNORMAL HIGH (ref 24–36)

## 2020-11-07 MED ORDER — ALBUTEROL SULFATE HFA 108 (90 BASE) MCG/ACT IN AERS
2.0000 | INHALATION_SPRAY | Freq: Four times a day (QID) | RESPIRATORY_TRACT | Status: DC | PRN
  Filled 2020-11-07: qty 6.7

## 2020-11-07 MED ORDER — CLONAZEPAM 0.5 MG PO TABS: 0.5000 mg | ORAL_TABLET | Freq: Two times a day (BID) | ORAL | Status: DC | PRN

## 2020-11-07 MED ORDER — MIRTAZAPINE 15 MG PO TABS
45.0000 mg | ORAL_TABLET | Freq: Every day | ORAL | Status: DC
  Administered 2020-11-07 – 2020-11-08 (×2): 45 mg via ORAL
  Filled 2020-11-07 (×2): qty 3

## 2020-11-07 MED ORDER — PANCRELIPASE (LIP-PROT-AMYL) 12000-38000 UNITS PO CPEP
72000.0000 [IU] | ORAL_CAPSULE | Freq: Three times a day (TID) | ORAL | Status: DC
  Administered 2020-11-08 – 2020-11-09 (×5): 72000 [IU] via ORAL
  Filled 2020-11-07 (×7): qty 6

## 2020-11-07 MED ORDER — GABAPENTIN 300 MG PO CAPS: 300.0000 mg | ORAL_CAPSULE | Freq: Three times a day (TID) | ORAL | Status: DC

## 2020-11-07 MED ORDER — POTASSIUM CHLORIDE 20 MEQ PO PACK
40.0000 meq | PACK | Freq: Once | ORAL | Status: AC
  Administered 2020-11-08: 40 meq via ORAL
  Filled 2020-11-07: qty 2

## 2020-11-07 MED ORDER — CARVEDILOL 6.25 MG PO TABS
6.2500 mg | ORAL_TABLET | Freq: Two times a day (BID) | ORAL | Status: DC
  Administered 2020-11-08 – 2020-11-09 (×3): 6.25 mg via ORAL
  Filled 2020-11-07 (×3): qty 1

## 2020-11-07 MED ORDER — ASPIRIN EC 81 MG PO TBEC
81.0000 mg | DELAYED_RELEASE_TABLET | Freq: Every day | ORAL | Status: DC
  Administered 2020-11-08 – 2020-11-09 (×2): 81 mg via ORAL
  Filled 2020-11-07 (×2): qty 1

## 2020-11-07 MED ORDER — ROPINIROLE HCL 1 MG PO TABS
4.0000 mg | ORAL_TABLET | Freq: Two times a day (BID) | ORAL | Status: DC
  Administered 2020-11-08 – 2020-11-09 (×4): 4 mg via ORAL
  Filled 2020-11-07 (×4): qty 4

## 2020-11-07 MED ORDER — THIAMINE HCL 100 MG PO TABS
100.0000 mg | ORAL_TABLET | Freq: Every day | ORAL | Status: DC
  Administered 2020-11-08 – 2020-11-09 (×2): 100 mg via ORAL
  Filled 2020-11-07 (×2): qty 1

## 2020-11-07 MED ORDER — ACETAMINOPHEN 160 MG/5ML PO SOLN
650.0000 mg | ORAL | Status: DC | PRN
  Filled 2020-11-07: qty 20.3

## 2020-11-07 MED ORDER — DULOXETINE HCL 30 MG PO CPEP
120.0000 mg | ORAL_CAPSULE | Freq: Every day | ORAL | Status: DC
  Administered 2020-11-08 – 2020-11-09 (×2): 120 mg via ORAL
  Filled 2020-11-07 (×2): qty 4

## 2020-11-07 MED ORDER — SODIUM CHLORIDE 0.9 % IV SOLN: INTRAVENOUS | Status: DC

## 2020-11-07 MED ORDER — ENOXAPARIN SODIUM 40 MG/0.4ML ~~LOC~~ SOLN: 40.0000 mg | SUBCUTANEOUS | Status: DC

## 2020-11-07 MED ORDER — ACETAMINOPHEN 325 MG PO TABS: 650.0000 mg | ORAL_TABLET | ORAL | Status: DC | PRN

## 2020-11-07 MED ORDER — APIXABAN 2.5 MG PO TABS
2.5000 mg | ORAL_TABLET | Freq: Two times a day (BID) | ORAL | Status: DC
  Administered 2020-11-08 (×2): 2.5 mg via ORAL
  Filled 2020-11-07 (×4): qty 1

## 2020-11-07 MED ORDER — OXYCODONE HCL 5 MG PO TABS: 5.0000 mg | ORAL_TABLET | Freq: Three times a day (TID) | ORAL | Status: DC | PRN

## 2020-11-07 MED ORDER — GLIPIZIDE ER 10 MG PO TB24
10.0000 mg | ORAL_TABLET | Freq: Every day | ORAL | Status: DC
  Filled 2020-11-07: qty 1

## 2020-11-07 MED ORDER — ACETAMINOPHEN 650 MG RE SUPP: 650.0000 mg | RECTAL | Status: DC | PRN

## 2020-11-07 MED ORDER — STROKE: EARLY STAGES OF RECOVERY BOOK: Freq: Once | Status: AC

## 2020-11-07 MED ORDER — RENA-VITE PO TABS
1.0000 | ORAL_TABLET | Freq: Every day | ORAL | Status: DC
  Administered 2020-11-08 (×2): 1 via ORAL
  Filled 2020-11-07 (×3): qty 1

## 2020-11-07 MED ORDER — SENNOSIDES-DOCUSATE SODIUM 8.6-50 MG PO TABS: 1.0000 | ORAL_TABLET | Freq: Every evening | ORAL | Status: DC | PRN

## 2020-11-07 MED ORDER — PANTOPRAZOLE SODIUM 40 MG PO TBEC
40.0000 mg | DELAYED_RELEASE_TABLET | Freq: Two times a day (BID) | ORAL | Status: DC
  Administered 2020-11-07 – 2020-11-08 (×3): 40 mg via ORAL
  Filled 2020-11-07 (×3): qty 1

## 2020-11-07 MED ORDER — NEPRO/CARBSTEADY PO LIQD
237.0000 mL | Freq: Two times a day (BID) | ORAL | Status: DC
  Administered 2020-11-08: 237 mL via ORAL

## 2020-11-07 MED ORDER — SUCRALFATE 1 G PO TABS
1.0000 g | ORAL_TABLET | Freq: Three times a day (TID) | ORAL | Status: DC
  Administered 2020-11-07 – 2020-11-09 (×5): 1 g via ORAL
  Filled 2020-11-07 (×5): qty 1

## 2020-11-07 NOTE — ED Triage Notes (Addendum)
Pt here via POV. Pt reports having an MRI on Monday which showed 8 strokes.   Pt is new dialysis pt, last session was yesterday where he completed a full session. Pt was recently released from the hospital due to new onset kidney failure. Pt reports prior to admission he had fallen back onto concrete and hit the back of his head.   Pt denies feeling weak/ any deficits except for some blurred vision but also states he has needed glasses for a while. Pt reports ringing in L ear since prior fall.

## 2020-11-07 NOTE — ED Provider Notes (Signed)
[facility] Emergency Department Provider Note    First MD Initiated Contact with Patient 11/07/20 2045     (approximate)  I have reviewed the triage vital signs and the nursing notes.   HISTORY  Chief Complaint Abnormal MRI results    HPI [name] is a 74 y.o. male with extensive past medical history as listed below with recent admission for new onset renal failure requiring dialysis presenting to the ER today due to outpatient MRI results showing evidence of CVA.  States he has been having some hearing loss as well as blurry vision and generalized weakness but the symptoms started during his hospitalization.  States he went to the ear nose and throat doctor this past week the evaluate and then ordered an MRI of the brain which was done 2 days ago.  Was told about the results today and came to the ER for further evaluation at the recommendation.    Past Medical History:  Diagnosis Date  . Chronic back pain   . COPD (chronic obstructive pulmonary disease) ([place])   . Depression   . Diabetes mellitus without complication ([place])   . Hyperlipidemia   . Hypertension   . Renal cell carcinoma ([place])   . Sleep apnea    Family History  Problem Relation Age of Onset  . Diabetes Mother   . Cancer Mother   . Heart disease Father    Past Surgical History:  Procedure Laterality Date  . CARPAL TUNNEL RELEASE    . CHOLECYSTECTOMY    . COLONOSCOPY WITH PROPOFOL N/A 07/29/2017   Procedure: COLONOSCOPY WITH PROPOFOL;  Surgeon: Manya Silvas, MD;  Location: [facility] ENDOSCOPY;  Service: Endoscopy;  Laterality: N/A;  . colonoscopys    . DIALYSIS/PERMA CATHETER INSERTION N/A 10/09/2020   Procedure: DIALYSIS/PERMA CATHETER INSERTION;  Surgeon: Katha Cabal, MD;  Location: [place] CV LAB;  Service: Cardiovascular;  Laterality: N/A;  . ESOPHAGOGASTRODUODENOSCOPY (EGD) WITH PROPOFOL N/A 07/29/2017   Procedure: ESOPHAGOGASTRODUODENOSCOPY (EGD) WITH  PROPOFOL;  Surgeon: Manya Silvas, MD;  Location: [facility] ENDOSCOPY;  Service: Endoscopy;  Laterality: N/A;  . FLEXIBLE SIGMOIDOSCOPY    . GASTRIC BYPASS    . NEPHRECTOMY    . open reduction and internal fixation, right distal radius    . right knee arthroscopy    . TEMPORARY DIALYSIS CATHETER N/A 10/02/2020   Procedure: TEMPORARY DIALYSIS CATHETER;  Surgeon: Katha Cabal, MD;  Location: [place] CV LAB;  Service: Cardiovascular;  Laterality: N/A;  . UPPER GI ENDOSCOPY     Patient Active Problem List   Diagnosis Date Noted  . Malnutrition of moderate degree 10/11/2020  . History of renal cell carcinoma 09/30/2020  . History of left nephrectomy 2004 09/30/2020  . AKI (acute kidney injury) ([place]) 09/30/2020  . Hypoglycemia 09/30/2020  . Pancreatic insufficiency 09/30/2020  . Chronic diarrhea 09/30/2020  . Metabolic acidosis 78/67/6720  . Hand numbness 04/28/2020  . CAP (community acquired pneumonia) 03/25/2020  . Acute on chronic respiratory failure with hypoxia ([place]) 03/25/2020  . Hyperlipidemia   . COPD (chronic obstructive pulmonary disease) ([place])   . Depression   . CKD stage 3 due to type 2 diabetes mellitus ([place])   . Cellulitis 01/10/2020  . Proteinuria 09/20/2019  . Hyperkalemia 07/05/2019  . Lumbar degenerative disc disease 05/11/2019  . Spinal stenosis, lumbar region, with neurogenic claudication 05/11/2019  . Ankylosis of lumbar spine 05/11/2019  . Neuroforaminal stenosis of lumbar spine (left, L5/S1) 05/11/2019  . Lumbar facet  arthropathy 05/11/2019  . Chronic pain syndrome 05/11/2019  . Chronic obstructive pulmonary disease ([place]) 01/03/2019  . Atherosclerotic peripheral vascular disease ([place]) 09/01/2018  . Lymphedema 06/22/2018  . Iron deficiency anemia 05/01/2018  . Anemia in chronic kidney disease 04/26/2018  . Varicose veins of leg with swelling, bilateral 04/09/2018  . SOB (shortness of breath) on exertion 03/17/2018  . Bilateral lower extremity  edema 02/23/2018  . Lower extremity pain, bilateral 02/23/2018  . Essential hypertension 02/23/2018  . H/O deep venous thrombosis 01/23/2017  . Type II diabetes mellitus with renal manifestations ([place]) 06/19/2014  . Chronic kidney disease, unspecified 06/19/2014  . Anxiety state 06/19/2014  . Other and unspecified hyperlipidemia 06/19/2014      Prior to Admission medications   Medication Sig Start Date End Date Taking? Authorizing Provider  albuterol (VENTOLIN HFA) 108 (90 Base) MCG/ACT inhaler Inhale 2 puffs into the lungs every 6 (six) hours as needed for wheezing.  05/09/20   [provider]  apixaban (ELIQUIS) 2.5 MG TABS tablet Take 1 tablet (2.5 mg total) by mouth 2 (two) times daily. 10/12/20   Swayze, Ava, DO  carvedilol (COREG) 6.25 MG tablet Take 6.25 mg by mouth 2 (two) times daily with a meal.    [provider]  clonazePAM (KLONOPIN) 0.5 MG tablet Take 0.5 mg by mouth 2 (two) times daily as needed. 06/25/20   [provider]  DULoxetine (CYMBALTA) 60 MG capsule Take 120 mg by mouth daily.  09/01/19   [provider]  gabapentin (NEURONTIN) 300 MG capsule Take 300 mg by mouth 3 (three) times daily. 09/25/20   [provider]  glipiZIDE (GLUCOTROL XL) 10 MG 24 hr tablet Take 10 mg by mouth daily. 07/25/20   [provider]  lipase/protease/amylase (CREON) 36000 UNITS CPEP capsule Take 2 capsules by mouth 3 (three) times daily with meals. 09/21/20 09/21/21  [provider]  mirtazapine (REMERON) 45 MG tablet Take 45 mg by mouth at bedtime.  07/05/20   [provider]  multivitamin (RENA-VIT) TABS tablet Take 1 tablet by mouth at bedtime. 10/12/20   Swayze, Ava, DO  Nutritional Supplements (FEEDING SUPPLEMENT, NEPRO CARB STEADY,) LIQD Take 237 mLs by mouth 2 (two) times daily between meals. 10/13/20   Swayze, Ava, DO  oxyCODONE (OXY IR/ROXICODONE) 5 MG immediate release tablet Take 1 tablet (5 mg total) by mouth every  8 (eight) hours as needed for severe pain. For chronic pain syndrome 11/02/20 12/02/20  Gillis Santa, MD  oxyCODONE (OXY IR/ROXICODONE) 5 MG immediate release tablet Take 1 tablet (5 mg total) by mouth every 8 (eight) hours as needed for severe pain. For chronic pain syndrome 12/02/20 01/01/21  Gillis Santa, MD  pantoprazole (PROTONIX) 40 MG tablet Take 40 mg by mouth 2 (two) times daily.     [provider]  rOPINIRole (REQUIP) 4 MG tablet Take 1 tablet (4 mg total) by mouth 2 (two) times daily before a meal. 10/12/20   Swayze, Ava, DO  sucralfate (CARAFATE) 1 g tablet Take 1 g by mouth 3 (three) times daily.     [provider]  thiamine 100 MG tablet Take 1 tablet (100 mg total) by mouth daily. 10/13/20   Swayze, Ava, DO    Allergies Bupropion    Social History Social History   Tobacco Use  . Smoking status: Former Smoker    Packs/day: 1.00    Years: 30.00    Pack years: 30.00  . Smokeless tobacco: Current User  Types: Chew  Vaping Use  . Vaping Use: Never used  Substance Use Topics  . Alcohol use: No  . Drug use: No    Review of Systems Patient denies headaches, rhinorrhea, blurry vision, numbness, shortness of breath, chest pain, edema, cough, abdominal pain, nausea, vomiting, diarrhea, dysuria, fevers, rashes or hallucinations unless otherwise stated above in HPI. ____________________________________________   PHYSICAL EXAM:  VITAL SIGNS: Vitals:   11/07/20 1733 11/07/20 2100  BP: 125/71 128/77  Pulse: 95 84  Resp: 18 (!) 22  Temp: 98.5 F (36.9 C)   SpO2: 96% 100%    Constitutional: Alert and oriented.  Eyes: Conjunctivae are normal.  Head: Atraumatic. Nose: No congestion/rhinnorhea. Mouth/Throat: Mucous membranes are moist.   Neck: No stridor. Painless ROM.  Cardiovascular: Normal rate, regular rhythm. Grossly normal heart sounds.  Good peripheral circulation. Respiratory: Normal respiratory effort.  No retractions. Lungs  CTAB. Gastrointestinal: Soft and nontender. No distention. No abdominal bruits. No CVA tenderness. Genitourinary:  Musculoskeletal: No lower extremity tenderness nor edema.  No joint effusions. Neurologic:  Normal speech and language. No gross focal neurologic deficits are appreciated. No facial droop Skin:  Skin is warm, dry and intact. No rash noted. Psychiatric: Mood and affect are normal. Speech and behavior are normal.  ____________________________________________   LABS (all labs ordered are listed, but only abnormal results are displayed)  Results for orders placed or performed during the hospital encounter of 11/07/20 (from the past 24 hour(s))  Protime-INR     Status: None   Collection Time: 11/07/20  5:33 PM  Result Value Ref Range   Prothrombin Time 13.5 11.4 - 15.2 seconds   INR 1.1 0.8 - 1.2  APTT     Status: Abnormal   Collection Time: 11/07/20  5:33 PM  Result Value Ref Range   aPTT 38 (H) 24 - 36 seconds  CBC     Status: Abnormal   Collection Time: 11/07/20  5:33 PM  Result Value Ref Range   WBC 8.6 4.0 - 10.5 K/uL   RBC 2.80 (L) 4.22 - 5.81 MIL/uL   Hemoglobin 8.6 (L) 13.0 - 17.0 g/dL   HCT 26.4 (L) 39 - 52 %   MCV 94.3 80.0 - 100.0 fL   MCH 30.7 26.0 - 34.0 pg   MCHC 32.6 30.0 - 36.0 g/dL   RDW 13.9 11.5 - 15.5 %   Platelets 139 (L) 150 - 400 K/uL   nRBC 0.0 0.0 - 0.2 %  Differential     Status: None   Collection Time: 11/07/20  5:33 PM  Result Value Ref Range   Neutrophils Relative % 77 %   Neutro Abs 6.6 1.7 - 7.7 K/uL   Lymphocytes Relative 16 %   Lymphs Abs 1.4 0.7 - 4.0 K/uL   Monocytes Relative 5 %   Monocytes Absolute 0.4 0.1 - 1.0 K/uL   Eosinophils Relative 2 %   Eosinophils Absolute 0.2 0.0 - 0.5 K/uL   Basophils Relative 0 %   Basophils Absolute 0.0 0.0 - 0.1 K/uL   Immature Granulocytes 0 %   Abs Immature Granulocytes 0.03 0.00 - 0.07 K/uL  Comprehensive metabolic panel     Status: Abnormal   Collection Time: 11/07/20  5:33 PM  Result  Value Ref Range   Sodium 143 135 - 145 mmol/L   Potassium 3.1 (L) 3.5 - 5.1 mmol/L   Chloride 108 98 - 111 mmol/L   CO2 23 22 - 32 mmol/L   Glucose, Bld 152 (H) 70 -  99 mg/dL   BUN 47 (H) 8 - 23 mg/dL   Creatinine, Ser 3.84 (H) 0.61 - 1.24 mg/dL   Calcium 7.7 (L) 8.9 - 10.3 mg/dL   Total Protein 6.8 6.5 - 8.1 g/dL   Albumin 3.7 3.5 - 5.0 g/dL   AST 15 15 - 41 U/L   ALT 14 0 - 44 U/L   Alkaline Phosphatase 117 38 - 126 U/L   Total Bilirubin 0.4 0.3 - 1.2 mg/dL   GFR, Estimated 16 (L) >60 mL/min   Anion gap 12 5 - 15   ____________________________________________  EKG My review and personal interpretation at Time: 17:26   Indication: cva  Rate: 95  Rhythm: sinus Axis: normal Other: bifasc block, no stemi, nonspecific st abn ____________________________________________  RADIOLOGY  MRI reviewed ____________________________________________   PROCEDURES  Procedure(s) performed:  Procedures    Critical Care performed: no ____________________________________________   INITIAL IMPRESSION / ASSESSMENT AND PLAN / ED COURSE  Pertinent labs & imaging results that were available during my care of the patient were reviewed by me and considered in my medical decision making (see chart for details).   DDX: cva, tia, hypoglycemia, dehydration, electrolyte abnormality, dissection, sepsis   [name] is a 74 y.o. who presents to the ED with extensive past medical history and recent hospitalization presented to the ER due to outpatient MRI showing evidence of acute infarct.  His exam is reassuring right now given duration well outside window for TPA.  Given his risk factors will discuss with hospitalist for admission for stroke work-up.     The patient was evaluated in Emergency Department today for the symptoms described in the history of present illness. He/she was evaluated in the context of the global COVID-19 pandemic, which necessitated consideration that the  patient might be at risk for infection with the SARS-CoV-2 virus that causes COVID-19. Institutional protocols and algorithms that pertain to the evaluation of patients at risk for COVID-19 are in a state of rapid change based on information released by regulatory bodies including the CDC and federal and state organizations. These policies and algorithms were followed during the patient's care in the ED.  As part of my medical decision making, I reviewed the following data within the [place] notes reviewed and incorporated, Labs reviewed, notes from prior ED visits and [place] Controlled Substance Database   ____________________________________________   FINAL CLINICAL IMPRESSION(S) / ED DIAGNOSES  Final diagnoses:  Cerebrovascular accident (CVA), unspecified mechanism ([place])      NEW MEDICATIONS STARTED DURING THIS VISIT:  New Prescriptions   No medications on file     Note:  This document was prepared using Dragon voice recognition software and may include unintentional dictation errors.    Merlyn Lot, MD 11/07/20 2109

## 2020-11-07 NOTE — ED Triage Notes (Signed)
Pt comes via POV with family with c/o abnormal MRI. Pt states he was called by his PCP and informed that he has had several small strokes and needs to come to ED. Pt also states he is new dialysis pt. Pt denies any dizziness, CP or SOB. Pt states some blurry vision.

## 2020-11-07 NOTE — H&P (Addendum)
[place]   PATIENT NAME: [name]    MR#:  [id number]  DATE OF BIRTH:  [birth year]  DATE OF ADMISSION:  11/07/2020  PRIMARY CARE PHYSICIAN: Derinda Late, MD   REQUESTING/REFERRING PHYSICIAN: Merlyn Lot, MD  CHIEF COMPLAINT:   Chief Complaint  Patient presents with  . Abnormal MRI results    HISTORY OF PRESENT ILLNESS:  [name]  is a 74 y.o. Caucasian male with a known history of type 2 diabetes mellitus, hypertension, COPD, dyslipidemia and obstructive sleep apnea, as well as end-stage renal disease on hemodialysis, who presented to the emergency room with acute onset of blurred vision and right sided hearing loss.  The patient had an outpatient MRI that revealed multiple punctate acute to subacute infarcts scattered in both superior hemispheres with no hemorrhage or mass-effect.  This was suspicious for embolic event or watershed ischemia. It also showed chronic small vessel ischemic disease.  Noncontrast dedicated internal auditory imaging was negative.  The patient denied headache or dizziness or lightheadedness, paresthesias or focal muscle weakness.  He denied any bleeding diathesis.  No fever or chills.  No vertigo or tinnitus.  Indicates hemodialysis on Mondays, Wednesdays and Fridays.  Upon presentation to the emergency room, vital signs were within normal.  Labs revealed hypokalemia of 3.1 with a BUN of 47 and creatinine 3.84 and calcium was 7.7.  CBC showed anemia. EKG showed normal sinus rhythm with rate of 98 with right bundle branch block and left anterior fascicular block (bifascicular block) and Q waves inferiorly.  The patient will be admitted to a medical monitored bed for further evaluation and management.  PAST MEDICAL HISTORY:   Past Medical History:  Diagnosis Date  . Chronic back pain   . COPD (chronic obstructive pulmonary disease) ([place])   . Depression   . Diabetes mellitus without complication ([place])   . Hyperlipidemia   .  Hypertension   . Renal cell carcinoma ([place])   . Sleep apnea   -End-stage renal disease on hemodialysis.  PAST SURGICAL HISTORY:   Past Surgical History:  Procedure Laterality Date  . CARPAL TUNNEL RELEASE    . CHOLECYSTECTOMY    . COLONOSCOPY WITH PROPOFOL N/A 07/29/2017   Procedure: COLONOSCOPY WITH PROPOFOL;  Surgeon: Manya Silvas, MD;  Location: [facility] ENDOSCOPY;  Service: Endoscopy;  Laterality: N/A;  . colonoscopys    . DIALYSIS/PERMA CATHETER INSERTION N/A 10/09/2020   Procedure: DIALYSIS/PERMA CATHETER INSERTION;  Surgeon: Katha Cabal, MD;  Location: [place] CV LAB;  Service: Cardiovascular;  Laterality: N/A;  . ESOPHAGOGASTRODUODENOSCOPY (EGD) WITH PROPOFOL N/A 07/29/2017   Procedure: ESOPHAGOGASTRODUODENOSCOPY (EGD) WITH PROPOFOL;  Surgeon: Manya Silvas, MD;  Location: [facility] ENDOSCOPY;  Service: Endoscopy;  Laterality: N/A;  . FLEXIBLE SIGMOIDOSCOPY    . GASTRIC BYPASS    . NEPHRECTOMY    . open reduction and internal fixation, right distal radius    . right knee arthroscopy    . TEMPORARY DIALYSIS CATHETER N/A 10/02/2020   Procedure: TEMPORARY DIALYSIS CATHETER;  Surgeon: Katha Cabal, MD;  Location: [place] CV LAB;  Service: Cardiovascular;  Laterality: N/A;  . UPPER GI ENDOSCOPY      SOCIAL HISTORY:   Social History   Tobacco Use  . Smoking status: Former Smoker    Packs/day: 1.00    Years: 30.00    Pack years: 30.00  . Smokeless tobacco: Current User    Types: Chew  Substance Use Topics  . Alcohol use: No  FAMILY HISTORY:   Family History  Problem Relation Age of Onset  . Diabetes Mother   . Cancer Mother   . Heart disease Father     DRUG ALLERGIES:   Allergies  Allergen Reactions  . Bupropion     Other reaction(s): Other (See Comments) Mood swings, angry    REVIEW OF SYSTEMS:   ROS As per history of present illness. All pertinent systems were reviewed above. Constitutional, HEENT, cardiovascular,  respiratory, GI, GU, musculoskeletal, neuro, psychiatric, endocrine, integumentary and hematologic systems were reviewed and are otherwise negative/unremarkable except for positive findings mentioned above in the HPI.   MEDICATIONS AT HOME:   Prior to Admission medications   Medication Sig Start Date End Date Taking? Authorizing Provider  albuterol (VENTOLIN HFA) 108 (90 Base) MCG/ACT inhaler Inhale 2 puffs into the lungs every 6 (six) hours as needed for wheezing.  05/09/20   [provider]  apixaban (ELIQUIS) 2.5 MG TABS tablet Take 1 tablet (2.5 mg total) by mouth 2 (two) times daily. 10/12/20   Swayze, Ava, DO  carvedilol (COREG) 6.25 MG tablet Take 6.25 mg by mouth 2 (two) times daily with a meal.    [provider]  clonazePAM (KLONOPIN) 0.5 MG tablet Take 0.5 mg by mouth 2 (two) times daily as needed. 06/25/20   [provider]  DULoxetine (CYMBALTA) 60 MG capsule Take 120 mg by mouth daily.  09/01/19   [provider]  gabapentin (NEURONTIN) 300 MG capsule Take 300 mg by mouth 3 (three) times daily. 09/25/20   [provider]  glipiZIDE (GLUCOTROL XL) 10 MG 24 hr tablet Take 10 mg by mouth daily. 07/25/20   [provider]  lipase/protease/amylase (CREON) 36000 UNITS CPEP capsule Take 2 capsules by mouth 3 (three) times daily with meals. 09/21/20 09/21/21  [provider]  mirtazapine (REMERON) 45 MG tablet Take 45 mg by mouth at bedtime.  07/05/20   [provider]  multivitamin (RENA-VIT) TABS tablet Take 1 tablet by mouth at bedtime. 10/12/20   Swayze, Ava, DO  Nutritional Supplements (FEEDING SUPPLEMENT, NEPRO CARB STEADY,) LIQD Take 237 mLs by mouth 2 (two) times daily between meals. 10/13/20   Swayze, Ava, DO  oxyCODONE (OXY IR/ROXICODONE) 5 MG immediate release tablet Take 1 tablet (5 mg total) by mouth every 8 (eight) hours as needed for severe pain. For chronic pain syndrome 11/02/20 12/02/20  Gillis Santa, MD   oxyCODONE (OXY IR/ROXICODONE) 5 MG immediate release tablet Take 1 tablet (5 mg total) by mouth every 8 (eight) hours as needed for severe pain. For chronic pain syndrome 12/02/20 01/01/21  Gillis Santa, MD  pantoprazole (PROTONIX) 40 MG tablet Take 40 mg by mouth 2 (two) times daily.     [provider]  rOPINIRole (REQUIP) 4 MG tablet Take 1 tablet (4 mg total) by mouth 2 (two) times daily before a meal. 10/12/20   Swayze, Ava, DO  sucralfate (CARAFATE) 1 g tablet Take 1 g by mouth 3 (three) times daily.     [provider]  thiamine 100 MG tablet Take 1 tablet (100 mg total) by mouth daily. 10/13/20   Swayze, Ava, DO      VITAL SIGNS:  Blood pressure 128/77, pulse 84, temperature 98.5 F (36.9 C), temperature source Oral, resp. rate (!) 22, height 6' (1.829 m), weight 111.1 kg, SpO2 100 %.  PHYSICAL EXAMINATION:  Physical Exam  GENERAL:  74 y.o.-year-old Caucasian male patient lying in the bed with no acute distress.  EYES: Pupils equal, round, reactive to light and accommodation. No scleral icterus. Extraocular muscles intact.  HEENT: Head atraumatic, normocephalic. Oropharynx and nasopharynx clear.  NECK:  Supple, no jugular venous distention. No thyroid enlargement, no tenderness.  LUNGS: Normal breath sounds bilaterally, no wheezing, rales,rhonchi or crepitation. No use of accessory muscles of respiration.  CARDIOVASCULAR: Regular rate and rhythm, S1, S2 normal. No murmurs, rubs, or gallops.  ABDOMEN: Soft, nondistended, nontender. Bowel sounds present. No organomegaly or mass.  EXTREMITIES: No pedal edema, cyanosis, or clubbing.  NEUROLOGIC: Cranial nerves II through XII are intact. Muscle strength 5/5 in all extremities. Sensation intact. Gait not checked.  PSYCHIATRIC: The patient is alert and oriented x 3.  Normal affect and good eye contact. SKIN: No obvious rash, lesion, or ulcer.   LABORATORY PANEL:   CBC Recent Labs  Lab 11/07/20 1733  WBC 8.6   HGB 8.6*  HCT 26.4*  PLT 139*   ------------------------------------------------------------------------------------------------------------------  Chemistries  Recent Labs  Lab 11/07/20 1733  NA 143  K 3.1*  CL 108  CO2 23  GLUCOSE 152*  BUN 47*  CREATININE 3.84*  CALCIUM 7.7*  AST 15  ALT 14  ALKPHOS 117  BILITOT 0.4   ------------------------------------------------------------------------------------------------------------------  Cardiac Enzymes No results for input(s): TROPONINI in the last 168 hours. ------------------------------------------------------------------------------------------------------------------  RADIOLOGY:  No results found.    IMPRESSION AND PLAN:   1.  Acute ischemic stroke, possibly embolic. -The patient admitted to a medically monitored bed. -Neurochecks will be followed every 4 hours. -We will continue him on Eliquis and add baby aspirin. -We will obtain 2D echo and carotid Doppler. -Neurology consult to be obtained. -I notified Dr. Irish Elders about the patient. -PT/OT and ST consults will be obtained.  2.  End-stage renal disease on hemodialysis. -Nephrology consultation will be obtained. -I notified Dr. Juleen China about the patient.  3.  Hypokalemia. -Potassium will be replaced and magnesium level will be checked.  4.  Type 2 diabetes mellitus with peripheral neuropathy. -We will continue Glucotrol XL and Neurontin. -The patient will be placed on supplement coverage with NovoLog.  5.  Restless leg syndrome. -We will continue Requip.  6.  GERD. -We will continue PPI therapy and Carafate.  7.  COPD without exacerbation. -We will continue Incruse Ellipta and place the patient on as needed duo nebs.  8.  Essential hypertension. -We will continue Coreg.  9.  DVT prophylaxis. -Subcutaneous heparin.   All the records are reviewed and case discussed with ED provider. The plan of care was discussed in details with the  patient (and family). I answered all questions. The patient agreed to proceed with the above mentioned plan. Further management will depend upon hospital course.   CODE STATUS: Full code  Status is: Inpatient  Remains inpatient appropriate because:Ongoing diagnostic testing needed not appropriate for outpatient work up, Unsafe d/c plan, IV treatments appropriate due to intensity of illness or inability to take PO and Inpatient level of care appropriate due to severity of illness   Dispo: The patient is from: Home              Anticipated d/c is to: Home              Anticipated d/c date is: 2 days              Patient currently is not medically stable to d/c.    TOTAL TIME TAKING CARE OF THIS PATIENT: 55 minutes.    Christel Mormon M.D on  11/07/2020 at 9:55 PM  [facility]   From 7 PM-7 AM, contact night-coverage [email or web address]  CC: Primary care physician; Derinda Late, MD

## 2020-11-08 ENCOUNTER — Inpatient Hospital Stay
Admit: 2020-11-08 | Discharge: 2020-11-08 | Disposition: A | Discharge status: Home / Self Care | Payer: Medicare Other | Attending: Family Medicine | Admitting: Family Medicine

## 2020-11-08 ENCOUNTER — Inpatient Hospital Stay: Payer: Medicare Other

## 2020-11-08 DIAGNOSIS — Z905 Acquired absence of kidney: Secondary | ICD-10-CM

## 2020-11-08 DIAGNOSIS — I10 Essential (primary) hypertension: Secondary | ICD-10-CM | POA: Diagnosis not present

## 2020-11-08 DIAGNOSIS — I639 Cerebral infarction, unspecified: Secondary | ICD-10-CM | POA: Diagnosis not present

## 2020-11-08 DIAGNOSIS — D631 Anemia in chronic kidney disease: Secondary | ICD-10-CM

## 2020-11-08 DIAGNOSIS — J449 Chronic obstructive pulmonary disease, unspecified: Secondary | ICD-10-CM

## 2020-11-08 DIAGNOSIS — E1122 Type 2 diabetes mellitus with diabetic chronic kidney disease: Secondary | ICD-10-CM | POA: Diagnosis not present

## 2020-11-08 LAB — LIPID PANEL
Cholesterol: 136 mg/dL (ref 0–200)
HDL: 29 mg/dL — ABNORMAL LOW (ref >40)
LDL Cholesterol: 77 mg/dL (ref 0–99)
Total CHOL/HDL Ratio: 4.7 RATIO
Triglycerides: 149 mg/dL (ref <150)
VLDL: 30 mg/dL (ref 0–40)

## 2020-11-08 LAB — GLUCOSE, CAPILLARY
Glucose-Capillary: 88 mg/dL (ref 70–99)
Glucose-Capillary: 124 mg/dL — ABNORMAL HIGH (ref 70–99)
Glucose-Capillary: 147 mg/dL — ABNORMAL HIGH (ref 70–99)

## 2020-11-08 LAB — MAGNESIUM: Magnesium: 1.6 mg/dL — ABNORMAL LOW (ref 1.7–2.4)

## 2020-11-08 LAB — ECHOCARDIOGRAM COMPLETE
AR max vel: 6.21 cm2
AV Area VTI: 6.6 cm2
AV Area mean vel: 5.98 cm2
AV Mean grad: 3 mmHg
AV Peak grad: 5.9 mmHg
Ao pk vel: 1.21 m/s
Area-P 1/2: 4.31 cm2
Height: 72 in
S' Lateral: 2.89 cm
Single Plane A4C EF: 56.9 %
Weight: 3920 oz

## 2020-11-08 LAB — HEMOGLOBIN A1C
Hgb A1c MFr Bld: 5.6 % (ref 4.8–5.6)
Mean Plasma Glucose: 114.02 mg/dL

## 2020-11-08 MED ORDER — MAGNESIUM SULFATE 2 GM/50ML IV SOLN
2.0000 g | Freq: Once | INTRAVENOUS | Status: AC
  Administered 2020-11-08: 2 g via INTRAVENOUS
  Filled 2020-11-08: qty 50

## 2020-11-08 MED ORDER — INSULIN ASPART 100 UNIT/ML ~~LOC~~ SOLN: 0.0000 [IU] | Freq: Every day | SUBCUTANEOUS | Status: DC

## 2020-11-08 MED ORDER — POTASSIUM CHLORIDE CRYS ER 20 MEQ PO TBCR
40.0000 meq | EXTENDED_RELEASE_TABLET | Freq: Once | ORAL | Status: AC
  Administered 2020-11-08: 10:00:00 40 meq via ORAL
  Filled 2020-11-08: qty 2

## 2020-11-08 MED ORDER — INSULIN ASPART 100 UNIT/ML ~~LOC~~ SOLN: 0.0000 [IU] | Freq: Three times a day (TID) | SUBCUTANEOUS | Status: DC

## 2020-11-08 NOTE — ED Notes (Signed)
Report called ,   Pt calm , collective upon transport .

## 2020-11-08 NOTE — Progress Notes (Signed)
PT Cancellation Note  Patient Details Name: [name] MRN: [id number] DOB: 1946-08-21   Cancelled Treatment:    Reason Eval/Treat Not Completed: Medical issues which prohibited therapy. Potassium is 3.1 and waiting for 2 D echo and doppler results.PT will re-attempt evaluation  Tomorrow.   Alanson Puls, [place] DPT 11/08/2020, 9:06 AM

## 2020-11-08 NOTE — Progress Notes (Signed)
*  PRELIMINARY RESULTS* Echocardiogram 2D Echocardiogram has been performed.  [name] [name] 11/08/2020, 10:44 AM

## 2020-11-08 NOTE — Progress Notes (Addendum)
PROGRESS NOTE  [name] XKG:[id number] DOB: [birth year] DOA: 11/07/2020 PCP: Derinda Late, MD   LOS: 1 day   Brief narrative: As per HPI,  [name]  is a 73 y.o. Caucasian male with a known history of type 2 diabetes mellitus, hypertension, COPD, dyslipidemia and obstructive sleep apnea,  end-stage renal disease on hemodialysis, presented to the emergency room with acute onset of blurred vision and has chronic right sided hearing loss.  The patient had an outpatient MRI that revealed multiple punctate acute to subacute infarcts scattered in both superior hemispheres with no hemorrhage or mass-effect.  This was suspicious for embolic event or watershed ischemia. It also showed chronic small vessel ischemic disease.  Noncontrast dedicated internal auditory imaging was negative.    In the ED, vital signs were within normal.  Labs revealed hypokalemia of 3.1 with a BUN of 47 and creatinine 3.84 and calcium was 7.7.  CBC showed anemia. EKG showed normal sinus rhythm with rate of 98 with right bundle branch block and left anterior fascicular block (bifascicular block) and Q waves inferiorly.  Patient was then admitted to hospital for acute CVA.  Neurology was consulted.   Assessment/Plan:  Principal Problem:   Acute CVA (cerebrovascular accident) [facility]) Active Problems:   Essential hypertension   Type II diabetes mellitus with renal manifestations ([place])   Anemia in chronic kidney disease   Chronic obstructive pulmonary disease ([place])   History of left nephrectomy 2004  Acute ischemic stroke, possibly embolic. Neurochecks.  Continue to monitor closely.  Continue Eliquis.  On aspirin.  Check 2D echocardiogram, carotid duplex.  Neurology has been notified.  Will get PT OT speech therapy.  End-stage renal disease on hemodialysis. On MWF schedule. Nephrology has been notified for hemodialysis.  For hemodialysis tomorrow.   Hypokalemia. Has been replenished.  Check levels in  a.m.  Type 2 diabetes mellitus with peripheral neuropathy. On Glucotrol XL and Neurontin.  We will hold off with Glucotrol for now.  Continue sliding scale insulin.   Restless leg syndrome. Continue Requip.  GERD. Continue PPI therapy and Carafate.  COPD without exacerbation. Continue Incruse Ellipta,duo nebs.  Essential hypertension. Continue Coreg.  Hypokalemia.  Will replenish.  Check levels in a.m.  DVT prophylaxis: apixaban (ELIQUIS) tablet 2.5 mg Start: 11/07/20 2200 apixaban (ELIQUIS) tablet 2.5 mg    Code Status: Full code  Family Communication: I spoke with wife Ms. Marple on the phone and updated her about the clinical condition of the patient.  Status is: Inpatient  Remains inpatient appropriate because:IV treatments appropriate due to intensity of illness or inability to take PO and Inpatient level of care appropriate due to severity of illness   Dispo: The patient is from: Home              Anticipated d/c is to: Home              Anticipated d/c date is: 2 days              Patient currently is not medically stable to d/c.  Consultants:  Neurology  Procedures:  None  Antibiotics:  . None  Anti-infectives (From admission, onward)   None     Subjective: Today, patient was seen and examined at bedside.  Patient complains of hearing loss for some time now, including mild right-sided weakness which he was getting physical therapy as outpatient.  Complains of mild blurry vision.  Denies any speech difficulty or swallowing.  Objective: Vitals:   11/08/20 0106  11/08/20 0335  BP: (!) 148/86 140/65  Pulse: 76 71  Resp: 17 17  Temp: 97.7 F (36.5 C) 97.6 F (36.4 C)  SpO2: 99% 99%    Intake/Output Summary (Last 24 hours) at 11/08/2020 0728 Last data filed at 11/08/2020 0500 Gross per 24 hour  Intake 100 ml  Output --  Net 100 ml   Filed Weights   11/07/20 1728  Weight: 111.1 kg   Body mass index is 33.23 kg/m.   Physical  Exam:  GENERAL: Patient is alert awake and oriented. Not in obvious distress.  Obese HENT: No scleral pallor or icterus. Pupils equally reactive to light. Oral mucosa is moist NECK: is supple, no gross swelling noted. CHEST: Clear to auscultation. No crackles or wheezes.  Diminished breath sounds bilaterally. CVS: S1 and S2 heard, no murmur. Regular rate and rhythm.  ABDOMEN: Soft, non-tender, bowel sounds are present. EXTREMITIES: No edema. CNS: Right-sided hearing difficulty.  Moving all extremities SKIN: warm and dry without rashes.  Data Review: I have personally reviewed the following laboratory data and studies,  CBC: Recent Labs  Lab 11/07/20 1733  WBC 8.6  NEUTROABS 6.6  HGB 8.6*  HCT 26.4*  MCV 94.3  PLT 962*   Basic Metabolic Panel: Recent Labs  Lab 11/07/20 1733  NA 143  K 3.1*  CL 108  CO2 23  GLUCOSE 152*  BUN 47*  CREATININE 3.84*  CALCIUM 7.7*  MG 1.6*   Liver Function Tests: Recent Labs  Lab 11/07/20 1733  AST 15  ALT 14  ALKPHOS 117  BILITOT 0.4  PROT 6.8  ALBUMIN 3.7   No results for input(s): LIPASE, AMYLASE in the last 168 hours. No results for input(s): AMMONIA in the last 168 hours. Cardiac Enzymes: No results for input(s): CKTOTAL, CKMB, CKMBINDEX, TROPONINI in the last 168 hours. BNP (last 3 results) Recent Labs    03/25/20 0557  BNP 31.0    ProBNP (last 3 results) No results for input(s): PROBNP in the last 8760 hours.  CBG: No results for input(s): GLUCAP in the last 168 hours. Recent Results (from the past 240 hour(s))  Resp Panel by RT-PCR (Flu A&B, Covid) Nasopharyngeal Swab     Status: None   Collection Time: 11/07/20 10:38 PM   Specimen: Nasopharyngeal Swab; Nasopharyngeal(NP) swabs in vial transport medium  Result Value Ref Range Status   SARS Coronavirus 2 by RT PCR NEGATIVE NEGATIVE Final    Comment: (NOTE) SARS-CoV-2 target nucleic acids are NOT DETECTED.  The SARS-CoV-2 RNA is generally detectable in upper  respiratory specimens during the acute phase of infection. The lowest concentration of SARS-CoV-2 viral copies this assay can detect is 138 copies/mL. A negative result does not preclude SARS-Cov-2 infection and should not be used as the sole basis for treatment or other patient management decisions. A negative result may occur with  improper specimen collection/handling, submission of specimen other than nasopharyngeal swab, presence of viral mutation(s) within the areas targeted by this assay, and inadequate number of viral copies(<138 copies/mL). A negative result must be combined with clinical observations, patient history, and epidemiological information. The expected result is Negative.  Fact Sheet for Patients:  EntrepreneurPulse.com.au  Fact Sheet for Healthcare Providers:  IncredibleEmployment.be  This test is no t yet approved or cleared by the [place] FDA and  has been authorized for detection and/or diagnosis of SARS-CoV-2 by FDA under an Emergency Use Authorization (EUA). This EUA will remain  in effect (meaning this test can be used)  for the duration of the COVID-19 declaration under Section 564(b)(1) of the Act, 21 U.S.C.section 360bbb-3(b)(1), unless the authorization is terminated  or revoked sooner.       Influenza A by PCR NEGATIVE NEGATIVE Final   Influenza B by PCR NEGATIVE NEGATIVE Final    Comment: (NOTE) The Xpert Xpress SARS-CoV-2/FLU/RSV plus assay is intended as an aid in the diagnosis of influenza from Nasopharyngeal swab specimens and should not be used as a sole basis for treatment. Nasal washings and aspirates are unacceptable for Xpert Xpress SARS-CoV-2/FLU/RSV testing.  Fact Sheet for Patients: EntrepreneurPulse.com.au  Fact Sheet for Healthcare Providers: IncredibleEmployment.be  This test is not yet approved or cleared by the [place] FDA and has been  authorized for detection and/or diagnosis of SARS-CoV-2 by FDA under an Emergency Use Authorization (EUA). This EUA will remain in effect (meaning this test can be used) for the duration of the COVID-19 declaration under Section 564(b)(1) of the Act, 21 U.S.C. section 360bbb-3(b)(1), unless the authorization is terminated or revoked.  Performed at [facility], [address], [place], [place] [address]      Studies: No results found.    Flora Lipps, MD  [facility] 11/08/2020

## 2020-11-08 NOTE — Progress Notes (Signed)
[facility]  ROUNDING NOTE   Subjective:   [name] was admitted to [facility] on 11/07/2020 for Stroke (cerebrum) [facility]) [I63.9] Acute CVA (cerebrovascular accident) [facility]) [I63.9] Cerebrovascular accident (CVA), unspecified mechanism ([place]) [I63.9]  Last hemodialysis treatment was 11/23.   Patient states his hearing has improved.   Objective:  Vital signs in last 24 hours:  Temp:  [97.6 F (36.4 C)-98.5 F (36.9 C)] 98.4 F (36.9 C) (11/25 1141) Pulse Rate:  [71-95] 72 (11/25 1141) Resp:  [13-24] 18 (11/25 0743) BP: (118-148)/(61-86) 128/71 (11/25 1141) SpO2:  [96 %-100 %] 98 % (11/25 1141) Weight:  [111.1 kg] 111.1 kg (11/24 1728)  Weight change:  Filed Weights   11/07/20 1728  Weight: 111.1 kg    Intake/Output: I/O last 3 completed shifts: In: 100 [I.V.:100] Out: -    Intake/Output this shift:  Total I/O In: -  Out: 600 [Urine:600]  Physical Exam: General: NAD,   Head: Normocephalic, atraumatic. Moist oral mucosal membranes  Eyes: Anicteric, PERRL  Neck: Supple, trachea midline  Lungs:  Clear to auscultation  Heart: Regular rate and rhythm  Abdomen:  Soft, nontender,   Extremities:  no peripheral edema.  Neurologic: Nonfocal, moving all four extremities  Skin: No lesions  Access: RIJ permcath    Basic Metabolic Panel: Recent Labs  Lab 11/07/20 1733  NA 143  K 3.1*  CL 108  CO2 23  GLUCOSE 152*  BUN 47*  CREATININE 3.84*  CALCIUM 7.7*  MG 1.6*    Liver Function Tests: Recent Labs  Lab 11/07/20 1733  AST 15  ALT 14  ALKPHOS 117  BILITOT 0.4  PROT 6.8  ALBUMIN 3.7   No results for input(s): LIPASE, AMYLASE in the last 168 hours. No results for input(s): AMMONIA in the last 168 hours.  CBC: Recent Labs  Lab 11/07/20 1733  WBC 8.6  NEUTROABS 6.6  HGB 8.6*  HCT 26.4*  MCV 94.3  PLT 139*    Cardiac Enzymes: No results for input(s): CKTOTAL, CKMB, CKMBINDEX, TROPONINI in the last 168  hours.  BNP: Invalid input(s): POCBNP  CBG: Recent Labs  Lab 11/08/20 0804 11/08/20 1200  GLUCAP 88 124*    Microbiology: Results for orders placed or performed during the hospital encounter of 11/07/20  Resp Panel by RT-PCR (Flu A&B, Covid) Nasopharyngeal Swab     Status: None   Collection Time: 11/07/20 10:38 PM   Specimen: Nasopharyngeal Swab; Nasopharyngeal(NP) swabs in vial transport medium  Result Value Ref Range Status   SARS Coronavirus 2 by RT PCR NEGATIVE NEGATIVE Final    Comment: (NOTE) SARS-CoV-2 target nucleic acids are NOT DETECTED.  The SARS-CoV-2 RNA is generally detectable in upper respiratory specimens during the acute phase of infection. The lowest concentration of SARS-CoV-2 viral copies this assay can detect is 138 copies/mL. A negative result does not preclude SARS-Cov-2 infection and should not be used as the sole basis for treatment or other patient management decisions. A negative result may occur with  improper specimen collection/handling, submission of specimen other than nasopharyngeal swab, presence of viral mutation(s) within the areas targeted by this assay, and inadequate number of viral copies(<138 copies/mL). A negative result must be combined with clinical observations, patient history, and epidemiological information. The expected result is Negative.  Fact Sheet for Patients:  EntrepreneurPulse.com.au  Fact Sheet for Healthcare Providers:  IncredibleEmployment.be  This test is no t yet approved or cleared by the [place] FDA and  has been authorized for detection  and/or diagnosis of SARS-CoV-2 by FDA under an Emergency Use Authorization (EUA). This EUA will remain  in effect (meaning this test can be used) for the duration of the COVID-19 declaration under Section 564(b)(1) of the Act, 21 U.S.C.section 360bbb-3(b)(1), unless the authorization is terminated  or revoked sooner.        Influenza A by PCR NEGATIVE NEGATIVE Final   Influenza B by PCR NEGATIVE NEGATIVE Final    Comment: (NOTE) The Xpert Xpress SARS-CoV-2/FLU/RSV plus assay is intended as an aid in the diagnosis of influenza from Nasopharyngeal swab specimens and should not be used as a sole basis for treatment. Nasal washings and aspirates are unacceptable for Xpert Xpress SARS-CoV-2/FLU/RSV testing.  Fact Sheet for Patients: EntrepreneurPulse.com.au  Fact Sheet for Healthcare Providers: IncredibleEmployment.be  This test is not yet approved or cleared by the [place] FDA and has been authorized for detection and/or diagnosis of SARS-CoV-2 by FDA under an Emergency Use Authorization (EUA). This EUA will remain in effect (meaning this test can be used) for the duration of the COVID-19 declaration under Section 564(b)(1) of the Act, 21 U.S.C. section 360bbb-3(b)(1), unless the authorization is terminated or revoked.  Performed at [facility], [address] [place], [place] [address]     Coagulation Studies: Recent Labs    11/07/20 1733  LABPROT 13.5  INR 1.1    Urinalysis: No results for input(s): COLORURINE, LABSPEC, PHURINE, GLUCOSEU, HGBUR, BILIRUBINUR, KETONESUR, PROTEINUR, UROBILINOGEN, NITRITE, LEUKOCYTESUR in the last 72 hours.  Invalid input(s): APPERANCEUR    Imaging: No results found.   Medications:   . sodium chloride 100 mL/hr at 11/08/20 0012   . apixaban  2.5 mg Oral BID  . aspirin EC  81 mg Oral Daily  . carvedilol  6.25 mg Oral BID WC  . DULoxetine  120 mg Oral Daily  . feeding supplement (NEPRO CARB STEADY)  237 mL Oral BID BM  . insulin aspart  0-5 Units Subcutaneous QHS  . insulin aspart  0-6 Units Subcutaneous TID WC  . lipase/protease/amylase  72,000 Units Oral TID WC  . mirtazapine  45 mg Oral QHS  . multivitamin  1 tablet Oral QHS  . pantoprazole  40 mg Oral BID  . rOPINIRole  4 mg Oral BID AC  .  sucralfate  1 g Oral TID  . thiamine  100 mg Oral Daily   acetaminophen **OR** acetaminophen (TYLENOL) oral liquid 160 mg/5 mL **OR** acetaminophen, albuterol, clonazePAM, oxyCODONE, senna-docusate  Assessment/ Plan:  [name] is a 74 y.o. white male with end stage renal disease on hemodialysis, hypertension, diabetes mellitus type II, sleep apnea, hyperlipidemia, COPD, depression who was admitted on 11/07/2020 for Stroke (cerebrum) [facility]) [I63.9] Acute CVA (cerebrovascular accident) ([place]) [I63.9] Cerebrovascular accident (CVA), unspecified mechanism ([place]) [I63.9]  [place] RIJ permcath 111.5kg  1. End Stage Renal Disease: resume MWF schedule. Dialysis for tomorrow.   2. Hypertension with ischemic stroke of watershed distribution. Avoid hypotension.  Currently on carvedilol  3. Anemia of chronic kidney disease: Hemoglobin 8.6. Hold EPO due to ischemic event.   4. Secondary Hyperparathyroidism: with hypocalcemia. Outpatient labs show PTH of 764 on 11/3 - elevated.  Not currently on binders or vitamin D agents.    LOS: 1 Bashir Marchetti 11/25/202112:49 PM

## 2020-11-08 NOTE — Consult Note (Signed)
Reason for Consult: stroke Requesting Physician: Dr. Louanne Belton  CC: stroke   HPI: [name] is an 74 y.o. male   male with a known history of type 2 diabetes mellitus, hypertension, COPD, dyslipidemia and obstructive sleep apnea, as well as end-stage renal disease on hemodialysis, who presented to the emergency room with acute onset of blurred vision and right sided hearing loss.  States symptoms started few days ago when he was hospitalized.  Imaging with bilateral watershed appearing strokes.    Past Medical History:  Diagnosis Date  . Chronic back pain   . COPD (chronic obstructive pulmonary disease) ([place])   . Depression   . Diabetes mellitus without complication ([place])   . Hyperlipidemia   . Hypertension   . Renal cell carcinoma ([place])   . Sleep apnea     Past Surgical History:  Procedure Laterality Date  . CARPAL TUNNEL RELEASE    . CHOLECYSTECTOMY    . COLONOSCOPY WITH PROPOFOL N/A 07/29/2017   Procedure: COLONOSCOPY WITH PROPOFOL;  Surgeon: Manya Silvas, MD;  Location: [facility] ENDOSCOPY;  Service: Endoscopy;  Laterality: N/A;  . colonoscopys    . DIALYSIS/PERMA CATHETER INSERTION N/A 10/09/2020   Procedure: DIALYSIS/PERMA CATHETER INSERTION;  Surgeon: Katha Cabal, MD;  Location: [place] CV LAB;  Service: Cardiovascular;  Laterality: N/A;  . ESOPHAGOGASTRODUODENOSCOPY (EGD) WITH PROPOFOL N/A 07/29/2017   Procedure: ESOPHAGOGASTRODUODENOSCOPY (EGD) WITH PROPOFOL;  Surgeon: Manya Silvas, MD;  Location: [facility] ENDOSCOPY;  Service: Endoscopy;  Laterality: N/A;  . FLEXIBLE SIGMOIDOSCOPY    . GASTRIC BYPASS    . NEPHRECTOMY    . open reduction and internal fixation, right distal radius    . right knee arthroscopy    . TEMPORARY DIALYSIS CATHETER N/A 10/02/2020   Procedure: TEMPORARY DIALYSIS CATHETER;  Surgeon: Katha Cabal, MD;  Location: [place] CV LAB;  Service: Cardiovascular;  Laterality: N/A;  . UPPER GI ENDOSCOPY      Family History   Problem Relation Age of Onset  . Diabetes Mother   . Cancer Mother   . Heart disease Father     Social History:  reports that he has quit smoking. He has a 30.00 pack-year smoking history. His smokeless tobacco use includes chew. He reports that he does not drink alcohol and does not use drugs.  Allergies  Allergen Reactions  . Bupropion     Other reaction(s): Other (See Comments) Mood swings, angry    Medications: I have reviewed the patient's current medications.  ROS: History obtained from the patient  General ROS: negative for - chills, fatigue, fever, night sweats, weight gain or weight loss Psychological ROS: negative for - behavioral disorder, hallucinations, memory difficulties, mood swings or suicidal ideation Ophthalmic ROS: negative for - blurry vision, double vision, eye pain or loss of vision ENT ROS: negative for - epistaxis, nasal discharge, oral lesions, sore throat, tinnitus or vertigo Allergy and Immunology ROS: negative for - hives or itchy/watery eyes Hematological and Lymphatic ROS: negative for - bleeding problems, bruising or swollen lymph nodes Endocrine ROS: negative for - galactorrhea, hair pattern changes, polydipsia/polyuria or temperature intolerance Respiratory ROS: negative for - cough, hemoptysis, shortness of breath or wheezing Cardiovascular ROS: negative for - chest pain, dyspnea on exertion, edema or irregular heartbeat Gastrointestinal ROS: negative for - abdominal pain, diarrhea, hematemesis, nausea/vomiting or stool incontinence Genito-Urinary ROS: negative for - dysuria, hematuria, incontinence or urinary frequency/urgency Musculoskeletal ROS: negative for - joint swelling or muscular weakness Neurological ROS: as noted in  HPI Dermatological ROS: negative for rash and skin lesion changes  Physical Examination: Blood pressure 128/71, pulse 72, temperature 98.4 F (36.9 C), resp. rate 18, height 6' (1.829 m), weight 111.1 kg, SpO2 98  %.    Neurological Examination   Mental Status: Alert, oriented, thought content appropriate.  Speech fluent without evidence of aphasia.  Able to follow 3 step commands without difficulty. States blurry vision but no sure how acute that is.  Cranial Nerves: II: visual fields grossly normal, pupils equal, round, reactive to light and accommodation III,IV, VI: ptosis not present, extra-ocular motions intact bilaterally V,VII: smile symmetric, facial light touch sensation normal bilaterally VIII: as per patient decreased hearing R ear.  IX,X: gag reflex present XI: bilateral shoulder shrug XII: midline tongue extension Motor: Right : Upper extremity   5/5    Left:     Upper extremity   5/5  Lower extremity   5/5     Lower extremity   5/5 Tone and bulk:normal tone throughout; no atrophy noted Sensory: Pinprick and light touch intact throughout, bilaterally Deep Tendon Reflexes: 2+ and symmetric throughout Plantars: Right: downgoing   Left: downgoing Cerebellar: normal finger-to-nose, normal rapid alternating movements and normal heel-to-shin test Gait: normal gait and station      Laboratory Studies:   Basic Metabolic Panel: Recent Labs  Lab 11/07/20 1733  NA 143  K 3.1*  CL 108  CO2 23  GLUCOSE 152*  BUN 47*  CREATININE 3.84*  CALCIUM 7.7*  MG 1.6*    Liver Function Tests: Recent Labs  Lab 11/07/20 1733  AST 15  ALT 14  ALKPHOS 117  BILITOT 0.4  PROT 6.8  ALBUMIN 3.7   No results for input(s): LIPASE, AMYLASE in the last 168 hours. No results for input(s): AMMONIA in the last 168 hours.  CBC: Recent Labs  Lab 11/07/20 1733  WBC 8.6  NEUTROABS 6.6  HGB 8.6*  HCT 26.4*  MCV 94.3  PLT 139*    Cardiac Enzymes: No results for input(s): CKTOTAL, CKMB, CKMBINDEX, TROPONINI in the last 168 hours.  BNP: Invalid input(s): POCBNP  CBG: Recent Labs  Lab 11/08/20 0804  GLUCAP 88    Microbiology: Results for orders placed or performed during the  hospital encounter of 11/07/20  Resp Panel by RT-PCR (Flu A&B, Covid) Nasopharyngeal Swab     Status: None   Collection Time: 11/07/20 10:38 PM   Specimen: Nasopharyngeal Swab; Nasopharyngeal(NP) swabs in vial transport medium  Result Value Ref Range Status   SARS Coronavirus 2 by RT PCR NEGATIVE NEGATIVE Final    Comment: (NOTE) SARS-CoV-2 target nucleic acids are NOT DETECTED.  The SARS-CoV-2 RNA is generally detectable in upper respiratory specimens during the acute phase of infection. The lowest concentration of SARS-CoV-2 viral copies this assay can detect is 138 copies/mL. A negative result does not preclude SARS-Cov-2 infection and should not be used as the sole basis for treatment or other patient management decisions. A negative result may occur with  improper specimen collection/handling, submission of specimen other than nasopharyngeal swab, presence of viral mutation(s) within the areas targeted by this assay, and inadequate number of viral copies(<138 copies/mL). A negative result must be combined with clinical observations, patient history, and epidemiological information. The expected result is Negative.  Fact Sheet for Patients:  EntrepreneurPulse.com.au  Fact Sheet for Healthcare Providers:  IncredibleEmployment.be  This test is no t yet approved or cleared by the [place] FDA and  has been authorized for detection and/or diagnosis  of SARS-CoV-2 by FDA under an Emergency Use Authorization (EUA). This EUA will remain  in effect (meaning this test can be used) for the duration of the COVID-19 declaration under Section 564(b)(1) of the Act, 21 U.S.C.section 360bbb-3(b)(1), unless the authorization is terminated  or revoked sooner.       Influenza A by PCR NEGATIVE NEGATIVE Final   Influenza B by PCR NEGATIVE NEGATIVE Final    Comment: (NOTE) The Xpert Xpress SARS-CoV-2/FLU/RSV plus assay is intended as an aid in the  diagnosis of influenza from Nasopharyngeal swab specimens and should not be used as a sole basis for treatment. Nasal washings and aspirates are unacceptable for Xpert Xpress SARS-CoV-2/FLU/RSV testing.  Fact Sheet for Patients: EntrepreneurPulse.com.au  Fact Sheet for Healthcare Providers: IncredibleEmployment.be  This test is not yet approved or cleared by the [place] FDA and has been authorized for detection and/or diagnosis of SARS-CoV-2 by FDA under an Emergency Use Authorization (EUA). This EUA will remain in effect (meaning this test can be used) for the duration of the COVID-19 declaration under Section 564(b)(1) of the Act, 21 U.S.C. section 360bbb-3(b)(1), unless the authorization is terminated or revoked.  Performed at [facility], [address] [place], [place] [address]     Coagulation Studies: Recent Labs    11/07/20 1733  LABPROT 13.5  INR 1.1    Urinalysis: No results for input(s): COLORURINE, LABSPEC, PHURINE, GLUCOSEU, HGBUR, BILIRUBINUR, KETONESUR, PROTEINUR, UROBILINOGEN, NITRITE, LEUKOCYTESUR in the last 168 hours.  Invalid input(s): APPERANCEUR  Lipid Panel:     Component Value Date/Time   CHOL 136 11/08/2020 0439   TRIG 149 11/08/2020 0439   HDL 29 (L) 11/08/2020 0439   CHOLHDL 4.7 11/08/2020 0439   VLDL 30 11/08/2020 0439   LDLCALC 77 11/08/2020 0439    HgbA1C:  Lab Results  Component Value Date   HGBA1C 5.6 11/08/2020    Urine Drug Screen:      Component Value Date/Time   LABOPIA NONE DETECTED 06/08/2019 1013   COCAINSCRNUR NONE DETECTED 06/08/2019 1013   LABBENZ NONE DETECTED 06/08/2019 1013   AMPHETMU NONE DETECTED 06/08/2019 1013   THCU NONE DETECTED 06/08/2019 1013   LABBARB NONE DETECTED 06/08/2019 1013    Alcohol Level: No results for input(s): ETH in the last 168 hours.  Other results: EKG: normal EKG, normal sinus rhythm, unchanged from previous  tracings.  Imaging: No results found.   Assessment/Plan:  74 y.o. male   male with a known history of type 2 diabetes mellitus, hypertension, COPD, dyslipidemia and obstructive sleep apnea, as well as end-stage renal disease on hemodialysis, who presented to the emergency room with acute onset of blurred vision and right sided hearing loss.  States symptoms started few days ago when he was hospitalized.  Imaging with bilateral watershed appearing strokes.     - Bilateral strokes that appear to be watershed in nature with possible periods of hypotension.  - No need to do embolic work up as it will not change management.  Pt is already on eliquis 2.5 bid for hx of DVT and PE - pt/ot/speech - no sure R hearing loss is attributed to the stroke symptoms as this time.   11/08/2020, 11:49 AM

## 2020-11-08 NOTE — Plan of Care (Signed)

## 2020-11-09 DIAGNOSIS — N186 End stage renal disease: Secondary | ICD-10-CM | POA: Diagnosis not present

## 2020-11-09 DIAGNOSIS — E1122 Type 2 diabetes mellitus with diabetic chronic kidney disease: Secondary | ICD-10-CM | POA: Diagnosis not present

## 2020-11-09 DIAGNOSIS — Z905 Acquired absence of kidney: Secondary | ICD-10-CM | POA: Diagnosis not present

## 2020-11-09 DIAGNOSIS — J449 Chronic obstructive pulmonary disease, unspecified: Secondary | ICD-10-CM | POA: Diagnosis not present

## 2020-11-09 LAB — COMPREHENSIVE METABOLIC PANEL
ALT: 14 U/L (ref 0–44)
AST: 12 U/L — ABNORMAL LOW (ref 15–41)
Albumin: 3.3 g/dL — ABNORMAL LOW (ref 3.5–5.0)
Alkaline Phosphatase: 112 U/L (ref 38–126)
Anion gap: 9 (ref 5–15)
BUN: 56 mg/dL — ABNORMAL HIGH (ref 8–23)
CO2: 23 mmol/L (ref 22–32)
Calcium: 7.8 mg/dL — ABNORMAL LOW (ref 8.9–10.3)
Chloride: 112 mmol/L — ABNORMAL HIGH (ref 98–111)
Creatinine, Ser: 3.98 mg/dL — ABNORMAL HIGH (ref 0.61–1.24)
GFR, Estimated: 15 mL/min — ABNORMAL LOW (ref >60)
Glucose, Bld: 126 mg/dL — ABNORMAL HIGH (ref 70–99)
Potassium: 4.4 mmol/L (ref 3.5–5.1)
Sodium: 144 mmol/L (ref 135–145)
Total Bilirubin: 0.5 mg/dL (ref 0.3–1.2)
Total Protein: 6.1 g/dL — ABNORMAL LOW (ref 6.5–8.1)

## 2020-11-09 LAB — CBC
HCT: 26 % — ABNORMAL LOW (ref 39.0–52.0)
Hemoglobin: 8.6 g/dL — ABNORMAL LOW (ref 13.0–17.0)
MCH: 31.2 pg (ref 26.0–34.0)
MCHC: 33.1 g/dL (ref 30.0–36.0)
MCV: 94.2 fL (ref 80.0–100.0)
Platelets: 132 10*3/uL — ABNORMAL LOW (ref 150–400)
RBC: 2.76 MIL/uL — ABNORMAL LOW (ref 4.22–5.81)
RDW: 14 % (ref 11.5–15.5)
WBC: 7.8 10*3/uL (ref 4.0–10.5)
nRBC: 0 % (ref 0.0–0.2)

## 2020-11-09 LAB — GLUCOSE, CAPILLARY
Glucose-Capillary: 103 mg/dL — ABNORMAL HIGH (ref 70–99)
Glucose-Capillary: 86 mg/dL (ref 70–99)

## 2020-11-09 LAB — MAGNESIUM: Magnesium: 2 mg/dL (ref 1.7–2.4)

## 2020-11-09 MED ORDER — ROSUVASTATIN CALCIUM 10 MG PO TABS
10.0000 mg | ORAL_TABLET | Freq: Every day | ORAL | Status: DC
  Administered 2020-11-09: 15:00:00 10 mg via ORAL
  Filled 2020-11-09: qty 1

## 2020-11-09 MED ORDER — CHLORHEXIDINE GLUCONATE CLOTH 2 % EX PADS: 6.0000 | MEDICATED_PAD | Freq: Every day | CUTANEOUS | Status: DC

## 2020-11-09 MED ORDER — ROSUVASTATIN CALCIUM 10 MG PO TABS: 10.0000 mg | ORAL_TABLET | Freq: Every day | ORAL | 2 refills | Status: AC

## 2020-11-09 NOTE — Progress Notes (Signed)
SLP Cancellation Note  Patient Details Name: [name] MRN: [id number] DOB: 01/22/1946   Cancelled treatment:       Reason Eval/Treat Not Completed: SLP screened, no needs identified, will sign off   Per chart review, and staff report, pt is at cognitive linguistic baseline. No acute deficits were identified. ST to sign off.   Canesha Tesfaye B. Rutherford Nail M.S., CCC-SLP, [place] Office [phone]  [name] 11/09/2020, 12:01 PM

## 2020-11-09 NOTE — Progress Notes (Signed)
PT Cancellation Note  Patient Details Name: [name] MRN: [id number] DOB: 21-Mar-1946   Cancelled Treatment:    Reason Eval/Treat Not Completed: Patient at HD and unavailable for PT eval.  Will attempt to see pt at a future date/time as medically appropriate.     Linus Salmons PT, DPT 11/09/20, 10:49 AM

## 2020-11-09 NOTE — Care Management Important Message (Signed)
Important Message  Patient Details  Name: [name] MRN: [id number] Date of Birth: 1946-10-13   Medicare Important Message Given:  Yes     Dannette Barbara 11/09/2020, 12:29 PM

## 2020-11-09 NOTE — TOC Initial Note (Addendum)
Transition of Care [facility]) - Initial/Assessment Note    Patient Details  Name: [name] MRN: [id number] Date of Birth: 10-05-1946  Transition of Care [facility]) CM/SW Contact:    Magnus Ivan, LCSW Phone Number: 11/09/2020, 2:41 PM  Clinical Narrative:                CSW spoke to patient for Readmission Screen. Patient lives with wife. Wife is currently taking him to his appointments since his previous hospitalization, he previously drove himself. PCP is Dr. Baldemar Lenis. [facility] [address] [facility] [place]. Patient has a RW. Patient reported he has never been to a SNF and never will go to a SNF. Patient reported he is active with [facility], CSW reached out to [place] to confirm, waiting on confirmation.  3:57- Helene Kelp confirmed patient is active with [facility] for HHPT, OT, RN.   Expected Discharge Plan: [place] Barriers to Discharge: Continued Medical Work up   Patient Goals and CMS Choice Patient states their goals for this hospitalization and ongoing recovery are:: home with home health CMS Medicare.gov Compare Post Acute Care list provided to:: Patient Choice offered to / list presented to : Patient  Expected Discharge Plan and Services Expected Discharge Plan: [place]       Living arrangements for the past 2 months: Single Family Home                                      Prior Living Arrangements/Services Living arrangements for the past 2 months: Single Family Home Lives with:: Spouse Patient language and need for interpreter reviewed:: Yes Do you feel safe going back to the place where you live?: Yes      Need for Family Participation in Patient Care: Yes (Comment) Care giver support system in place?: Yes (comment) Current home services: DME, Home OT, Home PT, Home RN Criminal Activity/Legal Involvement Pertinent to Current Situation/Hospitalization: No - Comment as needed  Activities of  Daily Living Home Assistive Devices/Equipment: None ADL Screening (condition at time of admission) Patient's cognitive ability adequate to safely complete daily activities?: Yes Is the patient deaf or have difficulty hearing?: No Does the patient have difficulty seeing, even when wearing glasses/contacts?: No Does the patient have difficulty concentrating, remembering, or making decisions?: No Patient able to express need for assistance with ADLs?: Yes Does the patient have difficulty dressing or bathing?: No Independently performs ADLs?: Yes (appropriate for developmental age) Does the patient have difficulty walking or climbing stairs?: No Weakness of Legs: None Weakness of Arms/Hands: None  Permission Sought/Granted Permission sought to share information with : Chartered certified accountant granted to share information with : Yes, Verbal Permission Granted     Permission granted to share info w AGENCY: [place], DME agencies        Emotional Assessment       Orientation: : Oriented to Situation, Oriented to  Time, Oriented to Place, Oriented to Self Alcohol / Substance Use: Not Applicable Psych Involvement: No (comment)  Admission diagnosis:  Stroke (cerebrum) (HCC) [I63.9] Acute CVA (cerebrovascular accident) ([place]) [I63.9] Cerebrovascular accident (CVA), unspecified mechanism ([place]) [I63.9] Patient Active Problem List   Diagnosis Date Noted  . Acute CVA (cerebrovascular accident) ([place]) 11/07/2020  . Malnutrition of moderate degree 10/11/2020  . History of renal cell carcinoma 09/30/2020  . History of left nephrectomy 2004 09/30/2020  .  AKI (acute kidney injury) ([place]) 09/30/2020  . Hypoglycemia 09/30/2020  . Pancreatic insufficiency 09/30/2020  . Chronic diarrhea 09/30/2020  . Metabolic acidosis 83/29/1916  . Hand numbness 04/28/2020  . CAP (community acquired pneumonia) 03/25/2020  . Acute on chronic respiratory failure with hypoxia ([place]) 03/25/2020  .  Hyperlipidemia   . COPD (chronic obstructive pulmonary disease) ([place])   . Depression   . CKD stage 3 due to type 2 diabetes mellitus ([place])   . Cellulitis 01/10/2020  . Proteinuria 09/20/2019  . Hyperkalemia 07/05/2019  . Lumbar degenerative disc disease 05/11/2019  . Spinal stenosis, lumbar region, with neurogenic claudication 05/11/2019  . Ankylosis of lumbar spine 05/11/2019  . Neuroforaminal stenosis of lumbar spine (left, L5/S1) 05/11/2019  . Lumbar facet arthropathy 05/11/2019  . Chronic pain syndrome 05/11/2019  . Chronic obstructive pulmonary disease ([place]) 01/03/2019  . Atherosclerotic peripheral vascular disease ([place]) 09/01/2018  . Lymphedema 06/22/2018  . Iron deficiency anemia 05/01/2018  . Anemia in chronic kidney disease 04/26/2018  . Varicose veins of leg with swelling, bilateral 04/09/2018  . SOB (shortness of breath) on exertion 03/17/2018  . Bilateral lower extremity edema 02/23/2018  . Lower extremity pain, bilateral 02/23/2018  . Essential hypertension 02/23/2018  . H/O deep venous thrombosis 01/23/2017  . Type II diabetes mellitus with renal manifestations ([place]) 06/19/2014  . Chronic kidney disease, unspecified 06/19/2014  . Anxiety state 06/19/2014  . Other and unspecified hyperlipidemia 06/19/2014   PCP:  Derinda Late, MD Pharmacy:   [id number] [place] [place] [address] [place] [address] Phone: [phone] Fax: [phone]     Social Determinants of Health (SDOH) Interventions    Readmission Risk Interventions Readmission Risk Prevention Plan 11/09/2020 10/08/2020 07/08/2019  Transportation Screening Complete Complete Complete  PCP or Specialist Appt within 3-5 Days - Complete Complete  HRI or [place] - Complete Complete  Social Work Consult for [place] Planning/Counseling - Complete Complete  Palliative Care Screening - Not Applicable Not Applicable  Medication Review Press photographer)  Complete Complete Complete  PCP or Specialist appointment within 3-5 days of discharge Complete - -  [place] or Home Care Consult Complete - -  SW Recovery Care/Counseling Consult Complete - -  Palliative Care Screening Complete - -  Skilled Nursing Facility Complete - -  Some recent data might be hidden
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
## 2020-11-09 NOTE — Discharge Summary (Signed)
Physician Discharge Summary  [name] [name] DOB: 1946-10-02 DOA: 11/07/2020  PCP: Derinda Late, MD  Admit date: 11/07/2020 Discharge date: 11/09/2020  Admitted From: Home  Discharge disposition: Home   Recommendations for Outpatient Follow-Up:   . Follow up with your primary care provider in one week.  . Check CBC, BMP, magnesium in the next visit . Follow-up with neurology in [facility]. Marland Kitchen Patient will benefit from follow-up with ENT clinic for his hearing loss  Discharge Diagnosis:   Principal Problem:   Acute CVA (cerebrovascular accident) [facility]) Active Problems:   Essential hypertension   Type II diabetes mellitus with renal manifestations ([place])   Anemia in chronic kidney disease   Chronic obstructive pulmonary disease ([place])   History of left nephrectomy 2004   Discharge Condition: Improved.  Diet recommendation: Low sodium, heart healthy.  Carbohydrate-modified.    Wound care: None.  Code status: Full.   History of Present Illness:   [name] a32 y.o.Caucasian malewith a known history of type 2 diabetes mellitus, hypertension, COPD, dyslipidemia and obstructive sleep apnea,  end-stage renal disease on hemodialysis, presented to the emergency room with acute onset of blurred vision and has chronic right sided hearing loss. The patient had an outpatient MRI that revealed multiple punctate acute to subacute infarcts scattered in both superior hemispheres with no hemorrhage or mass-effect. This was suspicious for embolic event or watershed ischemia. It also showed chronic small vesselischemicdisease. Noncontrast dedicated internal auditory imaging was negative.  In the ED, vital signs were within normal. Labs revealed hypokalemia of 3.1 with a BUN of 47 and creatinine 3.84 and calcium was 7.7. CBC showed anemia. EKG showed normal sinus rhythm with rate of 98 with right bundle branch block and left anterior fascicular  block (bifascicular block) and Q waves inferiorly.  Patient was then admitted to hospital for acute CVA.  Neurology was consulted.   Hospital Course:   Following conditions were addressed during hospitalization as listed below,  Acute ischemic stroke, thought to be secondary to watershed infarction. Patient was continued on Eliquis.  Spoke with neurology Dr. Irish Elders regarding the patient's neurological findings and stroke follow-up.  He did not recommend aspirin but recommended low-dose statins.  He however recommended continuation of Eliquis.  2D echocardiogram was performed which showed a preserved LV function.  Carotid duplex ultrasound was done which was pending at the time of my discharge.  Patient was seen by physical therapy who recommended resumption of physical therapy on discharge.  Patient preferred outpatient physical therapy.  End-stage renal disease on hemodialysis. On MWF schedule.   Patient received hemodialysis during hospitalization.  Type 2 diabetes mellitus with peripheral neuropathy. On Glucotrol XL and Neurontin as outpatient.  Will be resumed on discharge.    Restless leg syndrome. Continue Requip.  GERD. Continue PPI therapy and Carafate on discharge.Marland Kitchen  COPD without exacerbation. Continue albuterol inhaler on discharge  Essential hypertension. Continue Coreg on discharge.  Hypokalemia.    Replenished and improved.  Disposition.  At this time, patient is stable for disposition home with resumption of physical therapy at home.  Spoke with the patient's wife at bedside regarding plan for disposition.  Medical Consultants:    Neurology  Procedures:    Hemodialysis Subjective:   Today, patient was seen and examined during hemodialysis.  Does not feel dizzy lightheaded short of breath.  Has hearing loss for several weeks now.  Does not feel weak.  Feels more stronger today.  Discharge Exam:   Vitals:  11/09/20 1330 11/09/20 1400  BP: (!)  142/68 136/82  Pulse: 83 82  Resp: (!) 27 18  Temp:  98.9 F (37.2 C)  SpO2:  96%   Vitals:   11/09/20 1300 11/09/20 1315 11/09/20 1330 11/09/20 1400  BP: 133/67 138/75 (!) 142/68 136/82  Pulse: 82 80 83 82  Resp:   (!) 27 18  Temp:    98.9 F (37.2 C)  TempSrc:    Oral  SpO2:    96%  Weight:      Height:        General: Alert awake, not in obvious distress HENT: pupils equally reacting to light,  No scleral pallor or icterus noted. Oral mucosa is moist.  Hearing loss on both sides more on right side. Chest:  Clear breath sounds.  Diminished breath sounds bilaterally. No crackles or wheezes.  CVS: S1 &S2 heard. No murmur.  Regular rate and rhythm. Abdomen: Soft, nontender, nondistended.  Bowel sounds are heard.   Extremities: No cyanosis, clubbing or edema.  Peripheral pulses are palpable. Psych: Alert, awake and oriented, normal mood CNS:  No cranial nerve deficits.  Power equal in all extremities.   Skin: Warm and dry.  No rashes noted.  The results of significant diagnostics from this hospitalization (including imaging, microbiology, ancillary and laboratory) are listed below for reference.     Diagnostic Studies:   ECHOCARDIOGRAM COMPLETE  Result Date: 11/08/2020    ECHOCARDIOGRAM REPORT   Patient Name:   [name] [place] Date of Exam: 11/08/2020 Medical Rec #:  [id number]             Height:       72.0 in Accession #:    [id number]            Weight:       245.0 lb Date of Birth:  06-26-1946            BSA:          2.322 m Patient Age:    40 years              BP:           132/80 mmHg Patient Gender: M                     HR:           74 bpm. Exam Location:  [facility] Procedure: 2D Echo, Color Doppler and Cardiac Doppler Indications:     G45.9 TIA  History:         Patient has prior history of Echocardiogram examinations. COPD;                  Risk Factors:Sleep Apnea, Hypertension, Diabetes and                  Dyslipidemia.  Sonographer:     Charmayne Sheer RDCS (AE)  Referring Phys:  [id number] Arvella Merles [place] Diagnosing Phys: Bartholome Bill MD  Sonographer Comments: Suboptimal parasternal window. Image acquisition challenging due to COPD. IMPRESSIONS  1. Left ventricular ejection fraction, by estimation, is 70 to 75%. The left ventricle has hyperdynamic function. The left ventricle has no regional wall motion abnormalities. Left ventricular diastolic parameters were normal.  2. Right ventricular systolic function is normal. The right ventricular size is mildly enlarged.  3. The mitral valve is grossly normal. Trivial mitral valve regurgitation.  4. The aortic valve is grossly normal. Aortic valve regurgitation is not visualized. FINDINGS  Left Ventricle: Left ventricular ejection fraction, by estimation, is 70 to 75%. The left ventricle has hyperdynamic function. The left ventricle has no regional wall motion abnormalities. The left ventricular internal cavity size was normal in size. There is borderline left ventricular hypertrophy. Left ventricular diastolic parameters were normal. Right Ventricle: The right ventricular size is mildly enlarged. No increase in right ventricular wall thickness. Right ventricular systolic function is normal. Left Atrium: Left atrial size was normal in size. Right Atrium: Right atrial size was normal in size. Pericardium: There is no evidence of pericardial effusion. Mitral Valve: The mitral valve is grossly normal. Trivial mitral valve regurgitation. MV peak gradient, 3.7 mmHg. The mean mitral valve gradient is 2.0 mmHg. Tricuspid Valve: The tricuspid valve is grossly normal. Tricuspid valve regurgitation is mild. Aortic Valve: The aortic valve is grossly normal. Aortic valve regurgitation is not visualized. Aortic valve mean gradient measures 3.0 mmHg. Aortic valve peak gradient measures 5.9 mmHg. Aortic valve area, by VTI measures 6.60 cm. Pulmonic Valve: The pulmonic valve was not well visualized. Pulmonic valve regurgitation is trivial. Aorta: The  aortic root was not well visualized. IAS/Shunts: The interatrial septum was not assessed.  LEFT VENTRICLE PLAX 2D LVIDd:         4.91 cm      Diastology LVIDs:         2.89 cm      LV e' medial:    8.49 cm/s LV PW:         1.14 cm      LV E/e' medial:  8.8 LV IVS:        1.07 cm      LV e' lateral:   11.20 cm/s LVOT diam:     2.80 cm      LV E/e' lateral: 6.7 LV SV:         155 LV SV Index:   67 LVOT Area:     6.16 cm  LV Volumes (MOD) LV vol d, MOD A4C: 182.0 ml LV vol s, MOD A4C: 78.5 ml LV SV MOD A4C:     182.0 ml RIGHT VENTRICLE RV Basal diam:  3.82 cm LEFT ATRIUM             Index       RIGHT ATRIUM           Index LA diam:        4.00 cm 1.72 cm/m  RA Area:     17.60 cm LA Vol (A2C):   88.3 ml 38.02 ml/m RA Volume:   50.20 ml  21.61 ml/m LA Vol (A4C):   68.1 ml 29.32 ml/m LA Biplane Vol: 77.4 ml 33.33 ml/m  AORTIC VALVE                   PULMONIC VALVE AV Area (Vmax):    6.21 cm    PV Vmax:       0.99 m/s AV Area (Vmean):   5.98 cm    PV Vmean:      61.400 cm/s AV Area (VTI):     6.60 cm    PV VTI:        0.160 m AV Vmax:           121.00 cm/s PV Peak grad:  3.9 mmHg AV Vmean:          84.400 cm/s PV Mean grad:  2.0 mmHg AV VTI:            0.235 m  AV Peak Grad:      5.9 mmHg AV Mean Grad:      3.0 mmHg LVOT Vmax:         122.00 cm/s LVOT Vmean:        81.900 cm/s LVOT VTI:          0.252 m LVOT/AV VTI ratio: 1.07  AORTA Ao Root diam: 3.30 cm MITRAL VALVE MV Area (PHT): 4.31 cm    SHUNTS MV Peak grad:  3.7 mmHg    Systemic VTI:  0.25 m MV Mean grad:  2.0 mmHg    Systemic Diam: 2.80 cm MV Vmax:       0.96 m/s MV Vmean:      65.5 cm/s MV Decel Time: 176 msec MV E velocity: 75.00 cm/s MV A velocity: 60.40 cm/s MV E/A ratio:  1.24 Bartholome Bill MD Electronically signed by Bartholome Bill MD Signature Date/Time: 11/08/2020/3:48:29 PM    Final      Labs:   Basic Metabolic Panel: Recent Labs  Lab 11/07/20 1733 11/09/20 0519  NA 143 144  K 3.1* 4.4  CL 108 112*  CO2 23 23  GLUCOSE 152* 126*  BUN  47* 56*  CREATININE 3.84* 3.98*  CALCIUM 7.7* 7.8*  MG 1.6* 2.0   GFR Estimated Creatinine Clearance: 21.9 mL/min (A) (by C-G formula based on SCr of 3.98 mg/dL (H)). Liver Function Tests: Recent Labs  Lab 11/07/20 1733 11/09/20 0519  AST 15 12*  ALT 14 14  ALKPHOS 117 112  BILITOT 0.4 0.5  PROT 6.8 6.1*  ALBUMIN 3.7 3.3*   No results for input(s): LIPASE, AMYLASE in the last 168 hours. No results for input(s): AMMONIA in the last 168 hours. Coagulation profile Recent Labs  Lab 11/07/20 1733  INR 1.1    CBC: Recent Labs  Lab 11/07/20 1733 11/09/20 0519  WBC 8.6 7.8  NEUTROABS 6.6  --   HGB 8.6* 8.6*  HCT 26.4* 26.0*  MCV 94.3 94.2  PLT 139* 132*   Cardiac Enzymes: No results for input(s): CKTOTAL, CKMB, CKMBINDEX, TROPONINI in the last 168 hours. BNP: Invalid input(s): POCBNP CBG: Recent Labs  Lab 11/08/20 0804 11/08/20 1200 11/08/20 1643 11/09/20 0810 11/09/20 1434  GLUCAP 88 124* 147* 103* 86   D-Dimer No results for input(s): DDIMER in the last 72 hours. Hgb A1c Recent Labs    11/08/20 0439  HGBA1C 5.6   Lipid Profile Recent Labs    11/08/20 0439  CHOL 136  HDL 29*  LDLCALC 77  TRIG 149  CHOLHDL 4.7   Thyroid function studies No results for input(s): TSH, T4TOTAL, T3FREE, THYROIDAB in the last 72 hours.  Invalid input(s): FREET3 Anemia work up No results for input(s): VITAMINB12, FOLATE, FERRITIN, TIBC, IRON, RETICCTPCT in the last 72 hours. Microbiology Recent Results (from the past 240 hour(s))  Resp Panel by RT-PCR (Flu A&B, Covid) Nasopharyngeal Swab     Status: None   Collection Time: 11/07/20 10:38 PM   Specimen: Nasopharyngeal Swab; Nasopharyngeal(NP) swabs in vial transport medium  Result Value Ref Range Status   SARS Coronavirus 2 by RT PCR NEGATIVE NEGATIVE Final    Comment: (NOTE) SARS-CoV-2 target nucleic acids are NOT DETECTED.  The SARS-CoV-2 RNA is generally detectable in upper respiratory specimens during the  acute phase of infection. The lowest concentration of SARS-CoV-2 viral copies this assay can detect is 138 copies/mL. A negative result does not preclude SARS-Cov-2 infection and should not be used as the sole basis for treatment or  other patient management decisions. A negative result may occur with  improper specimen collection/handling, submission of specimen other than nasopharyngeal swab, presence of viral mutation(s) within the areas targeted by this assay, and inadequate number of viral copies(<138 copies/mL). A negative result must be combined with clinical observations, patient history, and epidemiological information. The expected result is Negative.  Fact Sheet for Patients:  EntrepreneurPulse.com.au  Fact Sheet for Healthcare Providers:  IncredibleEmployment.be  This test is no t yet approved or cleared by the [place] FDA and  has been authorized for detection and/or diagnosis of SARS-CoV-2 by FDA under an Emergency Use Authorization (EUA). This EUA will remain  in effect (meaning this test can be used) for the duration of the COVID-19 declaration under Section 564(b)(1) of the Act, 21 U.S.C.section 360bbb-3(b)(1), unless the authorization is terminated  or revoked sooner.       Influenza A by PCR NEGATIVE NEGATIVE Final   Influenza B by PCR NEGATIVE NEGATIVE Final    Comment: (NOTE) The Xpert Xpress SARS-CoV-2/FLU/RSV plus assay is intended as an aid in the diagnosis of influenza from Nasopharyngeal swab specimens and should not be used as a sole basis for treatment. Nasal washings and aspirates are unacceptable for Xpert Xpress SARS-CoV-2/FLU/RSV testing.  Fact Sheet for Patients: EntrepreneurPulse.com.au  Fact Sheet for Healthcare Providers: IncredibleEmployment.be  This test is not yet approved or cleared by the [place] FDA and has been authorized for detection and/or  diagnosis of SARS-CoV-2 by FDA under an Emergency Use Authorization (EUA). This EUA will remain in effect (meaning this test can be used) for the duration of the COVID-19 declaration under Section 564(b)(1) of the Act, 21 U.S.C. section 360bbb-3(b)(1), unless the authorization is terminated or revoked.  Performed at [facility], [address] [place], [place] [address]      Discharge Instructions:   Discharge Instructions    Diet - low sodium heart healthy   Complete by: As directed    Discharge instructions   Complete by: As directed    Follow up with your primary care provider in 1-2 weeks. Please follow with [facility] for neurology follow up. Continue PT at home.   Increase activity slowly   Complete by: As directed    Increase activity slowly   Complete by: As directed      Allergies as of 11/09/2020      Reactions   Bupropion    Other reaction(s): Other (See Comments) Mood swings, angry      Medication List    STOP taking these medications   predniSONE 10 MG tablet Commonly known as: DELTASONE     TAKE these medications   albuterol 108 (90 Base) MCG/ACT inhaler Commonly known as: VENTOLIN HFA Inhale 2 puffs into the lungs every 6 (six) hours as needed for wheezing.   apixaban 2.5 MG Tabs tablet Commonly known as: ELIQUIS Take 1 tablet (2.5 mg total) by mouth 2 (two) times daily.   carvedilol 6.25 MG tablet Commonly known as: COREG Take 6.25 mg by mouth 2 (two) times daily with a meal.   clonazePAM 0.5 MG tablet Commonly known as: KLONOPIN Take 0.5 mg by mouth 2 (two) times daily as needed.   DULoxetine 60 MG capsule Commonly known as: CYMBALTA Take 120 mg by mouth daily.   feeding supplement (NEPRO CARB STEADY) Liqd Take 237 mLs by mouth 2 (two) times daily between meals.   gabapentin 300 MG capsule Commonly known as: NEURONTIN Take 300 mg by mouth 3 (three)  times daily.   glipiZIDE 10 MG 24 hr tablet Commonly known as:  GLUCOTROL XL Take 10 mg by mouth daily.   lipase/protease/amylase 36000 UNITS Cpep capsule Commonly known as: CREON Take 2 capsules by mouth 3 (three) times daily with meals.   mirtazapine 45 MG tablet Commonly known as: REMERON Take 45 mg by mouth at bedtime.   multivitamin Tabs tablet Take 1 tablet by mouth at bedtime.   oxyCODONE 5 MG immediate release tablet Commonly known as: Oxy IR/ROXICODONE Take 1 tablet (5 mg total) by mouth every 8 (eight) hours as needed for severe pain. For chronic pain syndrome   oxyCODONE 5 MG immediate release tablet Commonly known as: Oxy IR/ROXICODONE Take 1 tablet (5 mg total) by mouth every 8 (eight) hours as needed for severe pain. For chronic pain syndrome Start taking on: December 02, 2020   pantoprazole 40 MG tablet Commonly known as: PROTONIX Take 40 mg by mouth 2 (two) times daily.   rOPINIRole 4 MG tablet Commonly known as: REQUIP Take 1 tablet (4 mg total) by mouth 2 (two) times daily before a meal.   rosuvastatin 10 MG tablet Commonly known as: Crestor Take 1 tablet (10 mg total) by mouth daily.   sucralfate 1 g tablet Commonly known as: CARAFATE Take 1 g by mouth 3 (three) times daily.   thiamine 100 MG tablet Take 1 tablet (100 mg total) by mouth daily.       Follow-up Information    Derinda Late, MD. Schedule an appointment as soon as possible for a visit in 1 week(s).   Specialty: Family Medicine Why: regular followup Contact information: [address] [facility] [place] [address] [phone]        Vladimir Crofts, MD. Schedule an appointment as soon as possible for a visit in 3 week(s).   Specialty: Neurology Why: for stroke followup Contact information: [address] [facility] [facility] [place] [address] [phone]                Time coordinating discharge: 39 minutes  Signed:  Love Milbourne  [facility] 11/09/2020, 4:38 PM

## 2020-11-09 NOTE — Progress Notes (Signed)
[facility]  ROUNDING NOTE   Subjective:   [name] was admitted to [facility] on 11/07/2020 for Stroke (cerebrum) [facility]) [I63.9] Acute CVA (cerebrovascular accident) [facility]) [I63.9] Cerebrovascular accident (CVA), unspecified mechanism ([place]) [I63.9]  Patient seen in dialysis today,receiving treatment sitting on a recliner,tolerating well.   HEMODIALYSIS FLOWSHEET:      Objective:  Vital signs in last 24 hours:  Temp:  [97.9 F (36.6 C)-98.4 F (36.9 C)] 98 F (36.7 C) (11/26 0412) Pulse Rate:  [72-78] 76 (11/26 0412) Resp:  [18-20] 20 (11/26 0412) BP: (126-139)/(71-81) 139/81 (11/26 0412) SpO2:  [98 %] 98 % (11/26 0412)  Weight change:  Filed Weights   11/07/20 1728  Weight: 111.1 kg    Intake/Output: I/O last 3 completed shifts: In: 2571.8 [I.V.:2521.8; IV Piggyback:50] Out: 4650 [PTWSF:6812]   Intake/Output this shift:  No intake/output data recorded.  Physical Exam: General: Pleasant and comfortable  Head: Normocephalic, atraumatic. Moist oral mucosal membranes  Eyes: Sclerae and conjunctivae clear  Lungs:   Respirations even, unlabored, lungs clear  Heart:  S1S2, no rubs or gallops  Abdomen:  Soft, nontender, non distended  Extremities:  No peripheral edema  Neurologic: Oriented x 3   Skin: No acute lesions or rashes  Access: RIJ permcath    Basic Metabolic Panel: Recent Labs  Lab 11/07/20 1733 11/09/20 0519  NA 143 144  K 3.1* 4.4  CL 108 112*  CO2 23 23  GLUCOSE 152* 126*  BUN 47* 56*  CREATININE 3.84* 3.98*  CALCIUM 7.7* 7.8*  MG 1.6* 2.0    Liver Function Tests: Recent Labs  Lab 11/07/20 1733 11/09/20 0519  AST 15 12*  ALT 14 14  ALKPHOS 117 112  BILITOT 0.4 0.5  PROT 6.8 6.1*  ALBUMIN 3.7 3.3*   No results for input(s): LIPASE, AMYLASE in the last 168 hours. No results for input(s): AMMONIA in the last 168 hours.  CBC: Recent Labs  Lab 11/07/20 1733 11/09/20 0519  WBC 8.6 7.8  NEUTROABS 6.6  --    HGB 8.6* 8.6*  HCT 26.4* 26.0*  MCV 94.3 94.2  PLT 139* 132*    Cardiac Enzymes: No results for input(s): CKTOTAL, CKMB, CKMBINDEX, TROPONINI in the last 168 hours.  BNP: Invalid input(s): POCBNP  CBG: Recent Labs  Lab 11/08/20 0804 11/08/20 1200 11/08/20 1643 11/09/20 0810  GLUCAP 88 124* 147* 103*    Microbiology: Results for orders placed or performed during the hospital encounter of 11/07/20  Resp Panel by RT-PCR (Flu A&B, Covid) Nasopharyngeal Swab     Status: None   Collection Time: 11/07/20 10:38 PM   Specimen: Nasopharyngeal Swab; Nasopharyngeal(NP) swabs in vial transport medium  Result Value Ref Range Status   SARS Coronavirus 2 by RT PCR NEGATIVE NEGATIVE Final    Comment: (NOTE) SARS-CoV-2 target nucleic acids are NOT DETECTED.  The SARS-CoV-2 RNA is generally detectable in upper respiratory specimens during the acute phase of infection. The lowest concentration of SARS-CoV-2 viral copies this assay can detect is 138 copies/mL. A negative result does not preclude SARS-Cov-2 infection and should not be used as the sole basis for treatment or other patient management decisions. A negative result may occur with  improper specimen collection/handling, submission of specimen other than nasopharyngeal swab, presence of viral mutation(s) within the areas targeted by this assay, and inadequate number of viral copies(<138 copies/mL). A negative result must be combined with clinical observations, patient history, and epidemiological information. The expected result is Negative.  Fact  Sheet for Patients:  EntrepreneurPulse.com.au  Fact Sheet for Healthcare Providers:  IncredibleEmployment.be  This test is no t yet approved or cleared by the [place] FDA and  has been authorized for detection and/or diagnosis of SARS-CoV-2 by FDA under an Emergency Use Authorization (EUA). This EUA will remain  in effect (meaning this  test can be used) for the duration of the COVID-19 declaration under Section 564(b)(1) of the Act, 21 U.S.C.section 360bbb-3(b)(1), unless the authorization is terminated  or revoked sooner.       Influenza A by PCR NEGATIVE NEGATIVE Final   Influenza B by PCR NEGATIVE NEGATIVE Final    Comment: (NOTE) The Xpert Xpress SARS-CoV-2/FLU/RSV plus assay is intended as an aid in the diagnosis of influenza from Nasopharyngeal swab specimens and should not be used as a sole basis for treatment. Nasal washings and aspirates are unacceptable for Xpert Xpress SARS-CoV-2/FLU/RSV testing.  Fact Sheet for Patients: EntrepreneurPulse.com.au  Fact Sheet for Healthcare Providers: IncredibleEmployment.be  This test is not yet approved or cleared by the [place] FDA and has been authorized for detection and/or diagnosis of SARS-CoV-2 by FDA under an Emergency Use Authorization (EUA). This EUA will remain in effect (meaning this test can be used) for the duration of the COVID-19 declaration under Section 564(b)(1) of the Act, 21 U.S.C. section 360bbb-3(b)(1), unless the authorization is terminated or revoked.  Performed at [facility], [address] [place], [place] [address]     Coagulation Studies: Recent Labs    11/07/20 1733  LABPROT 13.5  INR 1.1    Urinalysis: No results for input(s): COLORURINE, LABSPEC, PHURINE, GLUCOSEU, HGBUR, BILIRUBINUR, KETONESUR, PROTEINUR, UROBILINOGEN, NITRITE, LEUKOCYTESUR in the last 72 hours.  Invalid input(s): APPERANCEUR    Imaging: ECHOCARDIOGRAM COMPLETE  Result Date: 11/08/2020    ECHOCARDIOGRAM REPORT   Patient Name:   [name] Date of Exam: 11/08/2020 Medical Rec #:  [id number]             Height:       72.0 in Accession #:    [id number]            Weight:       245.0 lb Date of Birth:  Sep 27, 1946            BSA:          2.322 m Patient Age:    74 years              BP:            132/80 mmHg Patient Gender: M                     HR:           74 bpm. Exam Location:  [facility] Procedure: 2D Echo, Color Doppler and Cardiac Doppler Indications:     G45.9 TIA  History:         Patient has prior history of Echocardiogram examinations. COPD;                  Risk Factors:Sleep Apnea, Hypertension, Diabetes and                  Dyslipidemia.  Sonographer:     Charmayne Sheer RDCS (AE) Referring Phys:  [id number] Arvella Merles Tappen Diagnosing Phys: Bartholome Bill MD  Sonographer Comments: Suboptimal parasternal window. Image acquisition challenging due to COPD. IMPRESSIONS  1. Left ventricular ejection fraction, by estimation, is 70 to 75%. The  left ventricle has hyperdynamic function. The left ventricle has no regional wall motion abnormalities. Left ventricular diastolic parameters were normal.  2. Right ventricular systolic function is normal. The right ventricular size is mildly enlarged.  3. The mitral valve is grossly normal. Trivial mitral valve regurgitation.  4. The aortic valve is grossly normal. Aortic valve regurgitation is not visualized. FINDINGS  Left Ventricle: Left ventricular ejection fraction, by estimation, is 70 to 75%. The left ventricle has hyperdynamic function. The left ventricle has no regional wall motion abnormalities. The left ventricular internal cavity size was normal in size. There is borderline left ventricular hypertrophy. Left ventricular diastolic parameters were normal. Right Ventricle: The right ventricular size is mildly enlarged. No increase in right ventricular wall thickness. Right ventricular systolic function is normal. Left Atrium: Left atrial size was normal in size. Right Atrium: Right atrial size was normal in size. Pericardium: There is no evidence of pericardial effusion. Mitral Valve: The mitral valve is grossly normal. Trivial mitral valve regurgitation. MV peak gradient, 3.7 mmHg. The mean mitral valve gradient is 2.0 mmHg. Tricuspid Valve: The tricuspid valve is  grossly normal. Tricuspid valve regurgitation is mild. Aortic Valve: The aortic valve is grossly normal. Aortic valve regurgitation is not visualized. Aortic valve mean gradient measures 3.0 mmHg. Aortic valve peak gradient measures 5.9 mmHg. Aortic valve area, by VTI measures 6.60 cm. Pulmonic Valve: The pulmonic valve was not well visualized. Pulmonic valve regurgitation is trivial. Aorta: The aortic root was not well visualized. IAS/Shunts: The interatrial septum was not assessed.  LEFT VENTRICLE PLAX 2D LVIDd:         4.91 cm      Diastology LVIDs:         2.89 cm      LV e' medial:    8.49 cm/s LV PW:         1.14 cm      LV E/e' medial:  8.8 LV IVS:        1.07 cm      LV e' lateral:   11.20 cm/s LVOT diam:     2.80 cm      LV E/e' lateral: 6.7 LV SV:         155 LV SV Index:   67 LVOT Area:     6.16 cm  LV Volumes (MOD) LV vol d, MOD A4C: 182.0 ml LV vol s, MOD A4C: 78.5 ml LV SV MOD A4C:     182.0 ml RIGHT VENTRICLE RV Basal diam:  3.82 cm LEFT ATRIUM             Index       RIGHT ATRIUM           Index LA diam:        4.00 cm 1.72 cm/m  RA Area:     17.60 cm LA Vol (A2C):   88.3 ml 38.02 ml/m RA Volume:   50.20 ml  21.61 ml/m LA Vol (A4C):   68.1 ml 29.32 ml/m LA Biplane Vol: 77.4 ml 33.33 ml/m  AORTIC VALVE                   PULMONIC VALVE AV Area (Vmax):    6.21 cm    PV Vmax:       0.99 m/s AV Area (Vmean):   5.98 cm    PV Vmean:      61.400 cm/s AV Area (VTI):     6.60 cm    PV VTI:  0.160 m AV Vmax:           121.00 cm/s PV Peak grad:  3.9 mmHg AV Vmean:          84.400 cm/s PV Mean grad:  2.0 mmHg AV VTI:            0.235 m AV Peak Grad:      5.9 mmHg AV Mean Grad:      3.0 mmHg LVOT Vmax:         122.00 cm/s LVOT Vmean:        81.900 cm/s LVOT VTI:          0.252 m LVOT/AV VTI ratio: 1.07  AORTA Ao Root diam: 3.30 cm MITRAL VALVE MV Area (PHT): 4.31 cm    SHUNTS MV Peak grad:  3.7 mmHg    Systemic VTI:  0.25 m MV Mean grad:  2.0 mmHg    Systemic Diam: 2.80 cm MV Vmax:       0.96 m/s  MV Vmean:      65.5 cm/s MV Decel Time: 176 msec MV E velocity: 75.00 cm/s MV A velocity: 60.40 cm/s MV E/A ratio:  1.24 Bartholome Bill MD Electronically signed by Bartholome Bill MD Signature Date/Time: 11/08/2020/3:48:29 PM    Final      Medications:   . sodium chloride 100 mL/hr at 11/09/20 0600   . apixaban  2.5 mg Oral BID  . aspirin EC  81 mg Oral Daily  . carvedilol  6.25 mg Oral BID WC  . Chlorhexidine Gluconate Cloth  6 each Topical Q0600  . DULoxetine  120 mg Oral Daily  . feeding supplement (NEPRO CARB STEADY)  237 mL Oral BID BM  . insulin aspart  0-5 Units Subcutaneous QHS  . insulin aspart  0-6 Units Subcutaneous TID WC  . lipase/protease/amylase  72,000 Units Oral TID WC  . mirtazapine  45 mg Oral QHS  . multivitamin  1 tablet Oral QHS  . pantoprazole  40 mg Oral BID  . rOPINIRole  4 mg Oral BID AC  . sucralfate  1 g Oral TID  . thiamine  100 mg Oral Daily   acetaminophen **OR** acetaminophen (TYLENOL) oral liquid 160 mg/5 mL **OR** acetaminophen, albuterol, clonazePAM, oxyCODONE, senna-docusate  Assessment/ Plan:  Mr. Goro Wenrick is a 74 y.o. white male with end stage renal disease on hemodialysis, hypertension, diabetes mellitus type II, sleep apnea, hyperlipidemia, COPD, depression who was admitted on 11/07/2020 for Stroke (cerebrum) [facility]) [I63.9] Acute CVA (cerebrovascular accident) ([place]) [I63.9] Cerebrovascular accident (CVA), unspecified mechanism ([place]) [I63.9]  [place] RIJ permcath 111.5kg  # End Stage Renal Disease Patient is receiving dialysis today We will continue MWF schedule  # Hypertension  Blood Pressure readings within acceptable range Continue Carvedilol 6.25 mg BID  # Anemia of chronic kidney disease Lab Results  Component Value Date   HGB 8.6 (L) 11/09/2020   Holding Epogen due to stroke  # Secondary Hyperparathyroidism Lab Results  Component Value Date   CALCIUM 7.8 (L) 11/09/2020   PHOS 4.1 10/12/2020   We will continue monitoring bone mineral metabolism parameters   LOS: 2 Mega Kinkade 11/26/202110:49 AM

## 2020-11-09 NOTE — Evaluation (Signed)
Occupational Therapy Evaluation Patient Details Name: [name] MRN: [id number] DOB: [birth year] Today's Date: 11/09/2020    History of Present Illness 74 y.o. male   male with a known history of type 2 diabetes mellitus, hypertension, COPD, dyslipidemia and obstructive sleep apnea, as well as end-stage renal disease on hemodialysis, who presented to the emergency room with acute onset of blurred vision and right sided hearing loss.  States symptoms started few days ago when he was hospitalized.  Imaging with bilateral watershed appearing strokes.   Clinical Impression   Pt was seen for OT evaluation this date. Prior to hospital admission, pt was generally modified independent, using a hurricane for mobility, sponge bath 2/2 R IJ permcath. Pt lives with his spouse to provides assist for transportation, meal prep, housekeeping tasks, and med mgt. Pt endorses only 1 "major" fall prior to previous admission, but per chart review pt with multiple previous falls. Pt received in bathroom, RN walking out of room. Once finished, pt allowed therapist in bathroom. Pt performed toilet transfer and pericare with remote supervision. Did not have his hurricane with him in bathroom. SBA to ambulate back to bed and reaching out for the counter for stability, no overt LOB. Pt educated to use hurricane exclusively when up. Pt verbalized understanding. Currently pt demonstrates impairments as described below (See OT problem list) which functionally limit his ability to perform ADL/self-care tasks. Pt currently requires SBA to CGA for ADL transfers. Pt educated in falls prevention, visual strategies to limit prolonged up close visual focus and encourage increased opportunities for visual accommodation, safety, strategies for ADL tasks to minimize exacerbation of LBP. Pt verbalized understanding. Pt would benefit from skilled OT services to address noted impairments and functional limitations (see below for any  additional details) in order to maximize safety and independence while minimizing falls risk and caregiver burden. Pt reports having HHPT prior to this admission and had not started [place] yet. Upon hospital discharge, recommend HHOT to maximize pt safety and return to functional independence during meaningful occupations of daily life. Case mgt notified.      Follow Up Recommendations  Home health OT    Equipment Recommendations  None recommended by OT    Recommendations for Other Services       Precautions / Restrictions Precautions Precautions: Fall Restrictions Weight Bearing Restrictions: No      Mobility Bed Mobility Overal bed mobility: Modified Independent                  Transfers Overall transfer level: Modified independent Equipment used: Quad cane                  Balance Overall balance assessment: History of Falls;Needs assistance Sitting-balance support: No upper extremity supported;Feet supported Sitting balance-Leahy Scale: Good     Standing balance support: No upper extremity supported;During functional activity;Single extremity supported Standing balance-Leahy Scale: Fair Standing balance comment: without hurricane, pt reaching out for counter, wall for stability                           ADL either performed or assessed with clinical judgement   ADL Overall ADL's : Needs assistance/impaired                                       General ADL Comments: SBA for STS ADL transfers for safety/falls prevention  Vision Baseline Vision/History: Wears glasses Wears Glasses: Reading only Patient Visual Report: Blurring of vision (pt reports more blurry up close, not really new but reports worse than normal) Vision Assessment?: No apparent visual deficits Additional Comments: WFL quadrant testing, finger to nose     Perception     Praxis      Pertinent Vitals/Pain Pain Assessment: No/denies pain (reports  chronic LBP at baseline, but not bothering him during session)     Hand Dominance Left   Extremity/Trunk Assessment Upper Extremity Assessment Upper Extremity Assessment: Overall WFL for tasks assessed (grossly at least 4+/5)   Lower Extremity Assessment Lower Extremity Assessment: Overall WFL for tasks assessed (grossly at least 4+/5)   Cervical / Trunk Assessment Cervical / Trunk Assessment: Normal   Communication Communication Communication: No difficulties   Cognition Arousal/Alertness: Awake/alert Behavior During Therapy: WFL for tasks assessed/performed Overall Cognitive Status: Within Functional Limits for tasks assessed                                     General Comments       Exercises Other Exercises Other Exercises: Pt educated in visual strategies to limit prolonged up close visual focus and encourage increased opportunities for visual accommodation Other Exercises: Pt educated in falls prevention, safety, strategies for ADL tasks to minimize exacerbation of LBP Other Exercises: Pt able to don socks seated EOB without difficulty   Shoulder Instructions      Home Living Family/patient expects to be discharged to:: Private residence Living Arrangements: Spouse/significant other Available Help at Discharge: Family;Available 24 hours/day Type of Home: House Home Access: Ramped entrance;Stairs to enter Entrance Stairs-Number of Steps: ramp to enter from garage Entrance Stairs-Rails: Right;Left Home Layout: One level     Bathroom Shower/Tub: Occupational psychologist: Standard (has BSC over it)     Home Equipment: Environmental consultant - 2 wheels;Cane - single point;Shower seat;Bedside commode          Prior Functioning/Environment Level of Independence: Needs assistance  Gait / Transfers Assistance Needed: hurricane, multiple falls ADL's / Homemaking Assistance Needed: Pt mod indep with sponge bath (has R IJ cath), indep with dressing,  toileting, does not drive; wife drives, manages meds, meal prep, and housekeeping tasks            OT Problem List: Decreased strength;Decreased activity tolerance;Impaired balance (sitting and/or standing);Decreased knowledge of use of DME or AE      OT Treatment/Interventions: Self-care/ADL training;Therapeutic exercise;Therapeutic activities;Energy conservation;DME and/or AE instruction;Patient/family education;Balance training    OT Goals(Current goals can be found in the care plan section) Acute Rehab OT Goals Patient Stated Goal: go home OT Goal Formulation: With patient Time For Goal Achievement: 11/23/20 Potential to Achieve Goals: Good ADL Goals Additional ADL Goal #1: Pt will perform seated sponge bath and UB/LB dressing with modified independence and no LOB/VC for safety needed. Additional ADL Goal #2: Pt will verbalize plan to implement at least 1 learned falls prevention strategy to maximize safety  OT Frequency: Min 1X/week   Barriers to D/C:            Co-evaluation              AM-PAC OT "6 Clicks" Daily Activity     Outcome Measure Help from another person eating meals?: None Help from another person taking care of personal grooming?: None Help from another person toileting, which includes using toliet,  bedpan, or urinal?: A Little Help from another person bathing (including washing, rinsing, drying)?: A Little Help from another person to put on and taking off regular upper body clothing?: None Help from another person to put on and taking off regular lower body clothing?: A Little 6 Click Score: 21   End of Session    Activity Tolerance: Patient tolerated treatment well Patient left: in bed;with call bell/phone within reach;with bed alarm set  OT Visit Diagnosis: Other abnormalities of gait and mobility (R26.89);Repeated falls (R29.6)                Time: 4481-8563 OT Time Calculation (min): 22 min Charges:  OT General Charges $OT Visit: 1  Visit OT Evaluation $OT Eval Low Complexity: 1 Low OT Treatments $Self Care/Home Management : 8-22 mins  Jeni Salles, MPH, MS, OTR/L ascom [phone] 11/09/20, 10:52 AM

## 2020-11-23 ENCOUNTER — Inpatient Hospital Stay: Payer: Medicare Other | Attending: Oncology

## 2020-11-23 DIAGNOSIS — Z8249 Family history of ischemic heart disease and other diseases of the circulatory system: Secondary | ICD-10-CM | POA: Diagnosis not present

## 2020-11-23 DIAGNOSIS — E611 Iron deficiency: Secondary | ICD-10-CM | POA: Insufficient documentation

## 2020-11-23 DIAGNOSIS — D509 Iron deficiency anemia, unspecified: Secondary | ICD-10-CM

## 2020-11-23 DIAGNOSIS — Z833 Family history of diabetes mellitus: Secondary | ICD-10-CM | POA: Insufficient documentation

## 2020-11-23 DIAGNOSIS — Z905 Acquired absence of kidney: Secondary | ICD-10-CM | POA: Insufficient documentation

## 2020-11-23 DIAGNOSIS — N189 Chronic kidney disease, unspecified: Secondary | ICD-10-CM | POA: Diagnosis not present

## 2020-11-23 DIAGNOSIS — G4730 Sleep apnea, unspecified: Secondary | ICD-10-CM | POA: Insufficient documentation

## 2020-11-23 DIAGNOSIS — Z87891 Personal history of nicotine dependence: Secondary | ICD-10-CM | POA: Diagnosis not present

## 2020-11-23 DIAGNOSIS — Z9884 Bariatric surgery status: Secondary | ICD-10-CM | POA: Diagnosis not present

## 2020-11-23 DIAGNOSIS — Z7901 Long term (current) use of anticoagulants: Secondary | ICD-10-CM | POA: Diagnosis not present

## 2020-11-23 DIAGNOSIS — Z85528 Personal history of other malignant neoplasm of kidney: Secondary | ICD-10-CM | POA: Insufficient documentation

## 2020-11-23 DIAGNOSIS — E119 Type 2 diabetes mellitus without complications: Secondary | ICD-10-CM | POA: Insufficient documentation

## 2020-11-23 DIAGNOSIS — Z8673 Personal history of transient ischemic attack (TIA), and cerebral infarction without residual deficits: Secondary | ICD-10-CM | POA: Insufficient documentation

## 2020-11-23 DIAGNOSIS — D631 Anemia in chronic kidney disease: Secondary | ICD-10-CM | POA: Diagnosis present

## 2020-11-23 DIAGNOSIS — I10 Essential (primary) hypertension: Secondary | ICD-10-CM | POA: Insufficient documentation

## 2020-11-23 DIAGNOSIS — J449 Chronic obstructive pulmonary disease, unspecified: Secondary | ICD-10-CM | POA: Diagnosis not present

## 2020-11-23 DIAGNOSIS — Z7984 Long term (current) use of oral hypoglycemic drugs: Secondary | ICD-10-CM | POA: Insufficient documentation

## 2020-11-23 DIAGNOSIS — Z9049 Acquired absence of other specified parts of digestive tract: Secondary | ICD-10-CM | POA: Diagnosis not present

## 2020-11-23 DIAGNOSIS — F32A Depression, unspecified: Secondary | ICD-10-CM | POA: Diagnosis not present

## 2020-11-23 DIAGNOSIS — E785 Hyperlipidemia, unspecified: Secondary | ICD-10-CM | POA: Insufficient documentation

## 2020-11-23 DIAGNOSIS — Z79899 Other long term (current) drug therapy: Secondary | ICD-10-CM | POA: Diagnosis not present

## 2020-11-23 LAB — IRON AND TIBC
Iron: 34 ug/dL — ABNORMAL LOW (ref 45–182)
Saturation Ratios: 11 % — ABNORMAL LOW (ref 17.9–39.5)
TIBC: 304 ug/dL (ref 250–450)
UIBC: 270 ug/dL

## 2020-11-23 LAB — CBC WITH DIFFERENTIAL/PLATELET
Abs Immature Granulocytes: 0.04 10*3/uL (ref 0.00–0.07)
Basophils Absolute: 0 10*3/uL (ref 0.0–0.1)
Basophils Relative: 0 %
Eosinophils Absolute: 0.2 10*3/uL (ref 0.0–0.5)
Eosinophils Relative: 2 %
HCT: 25.2 % — ABNORMAL LOW (ref 39.0–52.0)
Hemoglobin: 8.6 g/dL — ABNORMAL LOW (ref 13.0–17.0)
Immature Granulocytes: 1 %
Lymphocytes Relative: 15 %
Lymphs Abs: 1.1 10*3/uL (ref 0.7–4.0)
MCH: 30.9 pg (ref 26.0–34.0)
MCHC: 34.1 g/dL (ref 30.0–36.0)
MCV: 90.6 fL (ref 80.0–100.0)
Monocytes Absolute: 0.4 10*3/uL (ref 0.1–1.0)
Monocytes Relative: 5 %
Neutro Abs: 5.7 10*3/uL (ref 1.7–7.7)
Neutrophils Relative %: 77 %
Platelets: 206 10*3/uL (ref 150–400)
RBC: 2.78 MIL/uL — ABNORMAL LOW (ref 4.22–5.81)
RDW: 14 % (ref 11.5–15.5)
WBC: 7.4 10*3/uL (ref 4.0–10.5)
nRBC: 0 % (ref 0.0–0.2)

## 2020-11-23 LAB — FERRITIN: Ferritin: 76 ng/mL (ref 24–336)

## 2020-11-26 ENCOUNTER — Inpatient Hospital Stay: Payer: Medicare Other

## 2020-11-26 ENCOUNTER — Encounter: Payer: Self-pay | Admitting: Oncology

## 2020-11-26 ENCOUNTER — Inpatient Hospital Stay (HOSPITAL_BASED_OUTPATIENT_CLINIC_OR_DEPARTMENT_OTHER): Payer: Medicare Other | Admitting: Oncology

## 2020-11-26 VITALS — BP 103/61 | HR 79 | Resp 20

## 2020-11-26 VITALS — BP 97/68 | HR 95 | Temp 99.5°F | Resp 20 | Wt 246.2 lb

## 2020-11-26 DIAGNOSIS — N189 Chronic kidney disease, unspecified: Secondary | ICD-10-CM

## 2020-11-26 DIAGNOSIS — D631 Anemia in chronic kidney disease: Secondary | ICD-10-CM | POA: Diagnosis not present

## 2020-11-26 DIAGNOSIS — D509 Iron deficiency anemia, unspecified: Secondary | ICD-10-CM | POA: Diagnosis not present

## 2020-11-26 MED ORDER — SODIUM CHLORIDE 0.9 % IV SOLN
Freq: Once | INTRAVENOUS | Status: AC
  Filled 2020-11-26: qty 250

## 2020-11-26 MED ORDER — SODIUM CHLORIDE 0.9 % IV SOLN
510.0000 mg | Freq: Once | INTRAVENOUS | Status: AC
  Administered 2020-11-26: 15:00:00 510 mg via INTRAVENOUS
  Filled 2020-11-26: qty 510

## 2020-11-26 NOTE — Progress Notes (Signed)
Patient denies any concerns today.  

## 2020-11-26 NOTE — Progress Notes (Signed)
[place]  [phone] [phone]  ID: [name] OB: 1946-03-11  MR#: [id number]  ION#:[id number]  Patient Care Team: Derinda Late, MD as PCP - General (Family Medicine)   CHIEF COMPLAINT: Anemia in chronic kidney disease, mild iron deficiency.  INTERVAL HISTORY: Patient returns to clinic today for repeat laboratory work, further evaluation, and continuation of treatment.  He was last seen in clinic on 08/27/2020.  He was given 1 dose of IV Feraheme.  He was not given Retacrit secondary to a hemoglobin of 10.1.  In the interim, he has started dialysis.  He was hospitalized from 09/30/2020-10/12/2020 for lower extremity weakness and worsening kidney function.  He was found to have acute kidney injury complicated by hyperkalemia, metabolic acidosis, uremic encephalopathy, myoclonic jerks, hyperphosphatemia and hypocalcemia. He was treated with IV sodium bicarbonate infusion. Nephrologist was consulted to assist with management. Had worsening kidney function with fluid overload and hypoxic respiratory failure. Vascular surgery was consulted for a temporary dialysis catheter placement. Patient underwent hemodialysis with improvement in his metabolic encephalopathy.He was hospitalized again from 11/07/2020-11/09/2020 for CVA.  Outpatient MRI showed multiple punctate acute to subacute infarct scattered in both superior hemispheres with no hemorrhage or mass-effect.  There was suspicious for embolic event or watershed ischemia.  He was started on Eliquis and low-dose statin.  In the interim, he has felt tired.  He has dialysis on Monday Wednesday and Friday.  He uses a cane to walk.  He has right hearing loss.  He is scheduled to have steroid injections in his right ear in the next couple weeks.  He otherwise feels well.  He has no neurologic complaints.  He denies any recent fevers or illnesses.  He has a good appetite and denies weight loss.   He denies any chest pain, shortness of breath, cough, or hemoptysis.  He denies any abdominal pain.  He has no nausea, vomiting, constipation, or diarrhea.  He denies any melena or hematochezia.  He has no urinary complaints.  Patient offers no further specific complaints today.   REVIEW OF SYSTEMS:   Review of Systems  Constitutional: Positive for malaise/fatigue. Negative for fever and weight loss.  Respiratory: Negative.  Negative for cough, hemoptysis and shortness of breath.   Cardiovascular: Negative.  Negative for chest pain and leg swelling.  Gastrointestinal: Negative.  Negative for abdominal pain and constipation.  Genitourinary: Negative.  Negative for flank pain and hematuria.  Musculoskeletal: Positive for back pain.  Skin: Negative.  Negative for rash.  Neurological: Positive for weakness. Negative for dizziness, sensory change, focal weakness and headaches.  Psychiatric/Behavioral: Negative.  The patient is not nervous/anxious.     As per HPI. Otherwise, a complete review of systems is negative.  PAST MEDICAL HISTORY: Past Medical History:  Diagnosis Date  . Chronic back pain   . COPD (chronic obstructive pulmonary disease) ([place])   . Depression   . Diabetes mellitus without complication ([place])   . Hyperlipidemia   . Hypertension   . Renal cell carcinoma ([place])   . Sleep apnea     PAST SURGICAL HISTORY: Past Surgical History:  Procedure Laterality Date  . CARPAL TUNNEL RELEASE    . CHOLECYSTECTOMY    . COLONOSCOPY WITH PROPOFOL N/A 07/29/2017   Procedure: COLONOSCOPY WITH PROPOFOL;  Surgeon: Manya Silvas, MD;  Location: [facility] ENDOSCOPY;  Service: Endoscopy;  Laterality: N/A;  . colonoscopys    . DIALYSIS/PERMA CATHETER INSERTION N/A 10/09/2020   Procedure: DIALYSIS/PERMA CATHETER INSERTION;  Surgeon:  Schnier, Dolores Lory, MD;  Location: [facility];  Service: Cardiovascular;  Laterality: N/A;  . ESOPHAGOGASTRODUODENOSCOPY (EGD) WITH PROPOFOL N/A  07/29/2017   Procedure: ESOPHAGOGASTRODUODENOSCOPY (EGD) WITH PROPOFOL;  Surgeon: Manya Silvas, MD;  Location: [facility] ENDOSCOPY;  Service: Endoscopy;  Laterality: N/A;  . FLEXIBLE SIGMOIDOSCOPY    . GASTRIC BYPASS    . NEPHRECTOMY    . open reduction and internal fixation, right distal radius    . right knee arthroscopy    . TEMPORARY DIALYSIS CATHETER N/A 10/02/2020   Procedure: TEMPORARY DIALYSIS CATHETER;  Surgeon: Katha Cabal, MD;  Location: [facility];  Service: Cardiovascular;  Laterality: N/A;  . UPPER GI ENDOSCOPY      FAMILY HISTORY: Family History  Problem Relation Age of Onset  . Diabetes Mother   . Cancer Mother   . Heart disease Father     ADVANCED DIRECTIVES (Y/N):  N  HEALTH MAINTENANCE: Social History   Tobacco Use  . Smoking status: Former Smoker    Packs/day: 1.00    Years: 30.00    Pack years: 30.00  . Smokeless tobacco: Current User    Types: Chew  Vaping Use  . Vaping Use: Never used  Substance Use Topics  . Alcohol use: No  . Drug use: No     Colonoscopy:  PAP:  Bone density:  Lipid panel:  Allergies  Allergen Reactions  . Bupropion     Other reaction(s): Other (See Comments) Mood swings, angry    Current Outpatient Medications  Medication Sig Dispense Refill  . albuterol (VENTOLIN HFA) 108 (90 Base) MCG/ACT inhaler Inhale 2 puffs into the lungs every 6 (six) hours as needed for wheezing.     Marland Kitchen apixaban (ELIQUIS) 2.5 MG TABS tablet Take 1 tablet (2.5 mg total) by mouth 2 (two) times daily. 60 tablet 0  . carvedilol (COREG) 6.25 MG tablet Take 6.25 mg by mouth 2 (two) times daily with a meal.    . clonazePAM (KLONOPIN) 0.5 MG tablet Take 0.5 mg by mouth 2 (two) times daily as needed.    . DULoxetine (CYMBALTA) 60 MG capsule Take 120 mg by mouth daily.     Marland Kitchen glipiZIDE (GLUCOTROL XL) 10 MG 24 hr tablet Take 10 mg by mouth daily.    . mirtazapine (REMERON) 45 MG tablet Take 45 mg by mouth at bedtime.     . multivitamin  (RENA-VIT) TABS tablet Take 1 tablet by mouth at bedtime. 30 tablet 0  . Nutritional Supplements (FEEDING SUPPLEMENT, NEPRO CARB STEADY,) LIQD Take 237 mLs by mouth 2 (two) times daily between meals. 237 mL 0  . oxyCODONE (OXY IR/ROXICODONE) 5 MG immediate release tablet Take 1 tablet (5 mg total) by mouth every 8 (eight) hours as needed for severe pain. For chronic pain syndrome 90 tablet 0  . [START ON 12/02/2020] oxyCODONE (OXY IR/ROXICODONE) 5 MG immediate release tablet Take 1 tablet (5 mg total) by mouth every 8 (eight) hours as needed for severe pain. For chronic pain syndrome 90 tablet 0  . pantoprazole (PROTONIX) 40 MG tablet Take 40 mg by mouth 2 (two) times daily.     Marland Kitchen rOPINIRole (REQUIP) 4 MG tablet Take 1 tablet (4 mg total) by mouth 2 (two) times daily before a meal. 60 tablet 0  . rosuvastatin (CRESTOR) 10 MG tablet Take 1 tablet (10 mg total) by mouth daily. 30 tablet 2  . sucralfate (CARAFATE) 1 g tablet Take 1 g by mouth 3 (three) times  daily.     . gabapentin (NEURONTIN) 300 MG capsule Take 300 mg by mouth 3 (three) times daily. (Patient not taking: No sig reported)    . lipase/protease/amylase (CREON) 36000 UNITS CPEP capsule Take 2 capsules by mouth 3 (three) times daily with meals. (Patient not taking: No sig reported)    . thiamine 100 MG tablet Take 1 tablet (100 mg total) by mouth daily. (Patient not taking: No sig reported) 30 tablet 0   No current facility-administered medications for this visit.    OBJECTIVE: Vitals:   11/26/20 1319 11/26/20 1324  BP: (!) 140/130 97/68  Pulse: 95   Resp: 20   Temp: 99.5 F (37.5 C)   SpO2: 98%      Body mass index is 33.39 kg/m.    ECOG FS:0 - Asymptomatic  Physical Exam Constitutional:      General: Vital signs are normal.     Appearance: Normal appearance.  HENT:     Head: Normocephalic and atraumatic.  Eyes:     Pupils: Pupils are equal, round, and reactive to light.  Cardiovascular:     Rate and Rhythm: Normal rate  and regular rhythm.     Heart sounds: Normal heart sounds. No murmur heard.   Pulmonary:     Effort: Pulmonary effort is normal.     Breath sounds: Normal breath sounds. No wheezing.  Abdominal:     General: Bowel sounds are normal. There is no distension.     Palpations: Abdomen is soft.     Tenderness: There is no abdominal tenderness.  Musculoskeletal:        General: No edema. Normal range of motion.     Cervical back: Normal range of motion.  Skin:    General: Skin is warm and dry.     Findings: No rash.  Neurological:     Mental Status: He is alert and oriented to person, place, and time.  Psychiatric:        Judgment: Judgment normal.     LAB RESULTS:  Lab Results  Component Value Date   NA 144 11/09/2020   K 4.4 11/09/2020   CL 112 (H) 11/09/2020   CO2 23 11/09/2020   GLUCOSE 126 (H) 11/09/2020   BUN 56 (H) 11/09/2020   CREATININE 3.98 (H) 11/09/2020   CALCIUM 7.8 (L) 11/09/2020   PROT 6.1 (L) 11/09/2020   ALBUMIN 3.3 (L) 11/09/2020   AST 12 (L) 11/09/2020   ALT 14 11/09/2020   ALKPHOS 112 11/09/2020   BILITOT 0.5 11/09/2020   GFRNONAA 15 (L) 11/09/2020   GFRAA 47 (L) 03/26/2020    Lab Results  Component Value Date   WBC 7.4 11/23/2020   NEUTROABS 5.7 11/23/2020   HGB 8.6 (L) 11/23/2020   HCT 25.2 (L) 11/23/2020   MCV 90.6 11/23/2020   PLT 206 11/23/2020   Lab Results  Component Value Date   IRON 34 (L) 11/23/2020   TIBC 304 11/23/2020   IRONPCTSAT 11 (L) 11/23/2020   Lab Results  Component Value Date   FERRITIN 76 11/23/2020     STUDIES: MR BRAIN WO CONTRAST  Addendum Date: 11/05/2020   ADDENDUM REPORT: 11/05/2020 10:25 ADDENDUM: Study discussed by telephone with PA Monik Lins BROOKS on 11/05/2020 at 1012 hours. Electronically Signed   By: Genevie Ann M.D.   On: 11/05/2020 10:25   Result Date: 11/05/2020 CLINICAL DATA:  74 year old male hospitalized for 13 days after a fall last month. 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 Dialysis patient. EXAM: MRI HEAD WITHOUT CONTRAST TECHNIQUE: Multiplanar, multiecho pulse sequences of the brain and surrounding structures were obtained without intravenous contrast. COMPARISON:  Plain head CT 10/03/2020 and earlier. FINDINGS: Brain: There are multiple small foci of abnormal diffusion scattered in both cerebral hemispheres, primarily the bilateral frontal and right parietal lobes. Approximately 10 such areas are identified (series 6, image 38) and on ADC some of the areas do not appear restricted (such as series 6, image 34 and series 7, image 34). Associated various degrees of T2 and FLAIR hyperintensity. No acute hemorrhage or mass effect. No deep gray nuclei, brainstem or cerebellar involvement identified. Additional scattered bilateral white matter T2 and FLAIR signal changes, fairly numerous in the bilateral subcortical white matter. And occasional chronic microhemorrhages are identified (series 13, image 38 and 33). No cortical encephalomalacia. Deep gray matter nuclei, brainstem and cerebellum appear within normal limits for age. No restricted diffusion to suggest acute infarction. No midline shift, mass effect, evidence of mass lesion, ventriculomegaly, extra-axial collection or acute intracranial hemorrhage. Cervicomedullary junction and pituitary are within normal limits. Vascular: Major intracranial vascular flow voids are preserved. Skull and upper cervical spine: Normal for age visible cervical spine, bone marrow signal. Sinuses/Orbits: Negative orbits. Paranasal sinuses are well aerated. Visible scalp and face soft tissues appear negative. Other: Noncontrast dedicated internal auditory imaging is provided. Normal cerebellopontine angles. Normal bilateral cisternal and intracanalicular 7th and 8th cranial nerve segments. Symmetric T2 signal in the bilateral cochlea and vestibular structures. Mastoids are well pneumatized. Normal stylomastoid foramina. No skull base  abnormality identified. IMPRESSION: 1. Multiple punctate acute to subacute infarcts are scattered in both superior hemispheres. No associated acute hemorrhage or mass effect. 2. The pattern suggests sequelae of embolic event. Watershed ischemia - such as due to a hypotensive episode - might also be capable of this appearance. Underlying moderate for age white matter signal changes elsewhere are noted in addition to occasional chronic micro-hemorrhages, suggesting underlying chronic small vessel disease. 3. Noncontrast dedicated internal auditory imaging is negative. Electronically Signed: By: Genevie Ann M.D. On: 11/05/2020 09:59   US Carotid Bilateral (at [facility] and [facility] only)  Result Date: 11/13/2020 CLINICAL DATA:  74 year old male with history of stroke. EXAM: BILATERAL CAROTID DUPLEX ULTRASOUND TECHNIQUE: Pearline Cables scale imaging, color Doppler and duplex ultrasound were performed of bilateral carotid and vertebral arteries in the neck. COMPARISON:  None. FINDINGS: Criteria: Quantification of carotid stenosis is based on velocity parameters that correlate the residual internal carotid diameter with NASCET-based stenosis levels, using the diameter of the distal internal carotid lumen as the denominator for stenosis measurement. The following velocity measurements were obtained: RIGHT ICA: Peak systolic velocity 295 cm/sec, End diastolic velocity 29 cm/sec CCA: Peak systolic velocity 188 cm/sec SYSTOLIC ICA/CCA RATIO:  0.1 ECA: Peak systolic velocity 416 cm/sec LEFT ICA: Peak systolic velocity 606 cm/sec, End diastolic velocity 28 cm/sec CCA: 92 cm/sec SYSTOLIC ICA/CCA RATIO:  1.3 ECA: 228 cm/sec RIGHT CAROTID ARTERY: Mild multifocal circumferential atherosclerotic plaque formation about the carotid bulb and bifurcation. No significant tortuosity. Normal low resistance waveforms. RIGHT VERTEBRAL ARTERY:  Antegrade flow. LEFT CAROTID ARTERY: Mild multifocal circumferential atherosclerotic plaque formation about the carotid  bulb and bifurcation. No significant tortuosity. Normal low resistance waveforms. LEFT VERTEBRAL ARTERY:  Antegrade flow. Upper extremity non-invasive blood pressures: Not obtained. IMPRESSION: 1. Right carotid artery system: Less than 50% stenosis secondary to mild atherosclerotic plaque formation. 2. Left carotid artery system: Less than 50% stenosis secondary to mild atherosclerotic plaque formation.  3.  Vertebral artery system: Patent with antegrade flow bilaterally. Ruthann Cancer, MD [facility] Electronically Signed   By: Ruthann Cancer MD   On: 11/13/2020 10:42   ECHOCARDIOGRAM COMPLETE  Result Date: 11/08/2020    ECHOCARDIOGRAM REPORT   Patient Name:   [name] Date of Exam: 11/08/2020 Medical Rec #:  [id number]             Height:       72.0 in Accession #:    [id number]            Weight:       245.0 lb Date of Birth:  [birth year]            BSA:          2.322 m Patient Age:    26 years              BP:           132/80 mmHg Patient Gender: M                     HR:           74 bpm. Exam Location:  [facility] Procedure: 2D Echo, Color Doppler and Cardiac Doppler Indications:     G45.9 TIA  History:         Patient has prior history of Echocardiogram examinations. COPD;                  Risk Factors:Sleep Apnea, Hypertension, Diabetes and                  Dyslipidemia.  Sonographer:     Charmayne Sheer RDCS (AE) Referring Phys:  [id number] Arvella Merles [place] Diagnosing Phys: Bartholome Bill MD  Sonographer Comments: Suboptimal parasternal window. Image acquisition challenging due to COPD. IMPRESSIONS  1. Left ventricular ejection fraction, by estimation, is 70 to 75%. The left ventricle has hyperdynamic function. The left ventricle has no regional wall motion abnormalities. Left ventricular diastolic parameters were normal.  2. Right ventricular systolic function is normal. The right ventricular size is mildly enlarged.  3. The mitral valve is grossly  normal. Trivial mitral valve regurgitation.  4. The aortic valve is grossly normal. Aortic valve regurgitation is not visualized. FINDINGS  Left Ventricle: Left ventricular ejection fraction, by estimation, is 70 to 75%. The left ventricle has hyperdynamic function. The left ventricle has no regional wall motion abnormalities. The left ventricular internal cavity size was normal in size. There is borderline left ventricular hypertrophy. Left ventricular diastolic parameters were normal. Right Ventricle: The right ventricular size is mildly enlarged. No increase in right ventricular wall thickness. Right ventricular systolic function is normal. Left Atrium: Left atrial size was normal in size. Right Atrium: Right atrial size was normal in size. Pericardium: There is no evidence of pericardial effusion. Mitral Valve: The mitral valve is grossly normal. Trivial mitral valve regurgitation. MV peak gradient, 3.7 mmHg. The mean mitral valve gradient is 2.0 mmHg. Tricuspid Valve: The tricuspid valve is grossly normal. Tricuspid valve regurgitation is mild. Aortic Valve: The aortic valve is grossly normal. Aortic valve regurgitation is not visualized. Aortic valve mean gradient measures 3.0 mmHg. Aortic valve peak gradient measures 5.9 mmHg. Aortic valve area, by VTI measures 6.60 cm. Pulmonic Valve: The pulmonic valve was not well visualized. Pulmonic valve regurgitation is trivial. Aorta: The aortic root was not well visualized. IAS/Shunts: The interatrial septum was not assessed.  LEFT VENTRICLE PLAX 2D LVIDd:         4.91 cm      Diastology LVIDs:         2.89 cm      LV e' medial:    8.49 cm/s LV PW:         1.14 cm      LV E/e' medial:  8.8 LV IVS:        1.07 cm      LV e' lateral:   11.20 cm/s LVOT diam:     2.80 cm      LV E/e' lateral: 6.7 LV SV:         155 LV SV Index:   67 LVOT Area:     6.16 cm  LV Volumes (MOD) LV vol d, MOD A4C: 182.0 ml LV vol s, MOD A4C: 78.5 ml LV SV MOD A4C:     182.0 ml RIGHT  VENTRICLE RV Basal diam:  3.82 cm LEFT ATRIUM             Index       RIGHT ATRIUM           Index LA diam:        4.00 cm 1.72 cm/m  RA Area:     17.60 cm LA Vol (A2C):   88.3 ml 38.02 ml/m RA Volume:   50.20 ml  21.61 ml/m LA Vol (A4C):   68.1 ml 29.32 ml/m LA Biplane Vol: 77.4 ml 33.33 ml/m  AORTIC VALVE                   PULMONIC VALVE AV Area (Vmax):    6.21 cm    PV Vmax:       0.99 m/s AV Area (Vmean):   5.98 cm    PV Vmean:      61.400 cm/s AV Area (VTI):     6.60 cm    PV VTI:        0.160 m AV Vmax:           121.00 cm/s PV Peak grad:  3.9 mmHg AV Vmean:          84.400 cm/s PV Mean grad:  2.0 mmHg AV VTI:            0.235 m AV Peak Grad:      5.9 mmHg AV Mean Grad:      3.0 mmHg LVOT Vmax:         122.00 cm/s LVOT Vmean:        81.900 cm/s LVOT VTI:          0.252 m LVOT/AV VTI ratio: 1.07  AORTA Ao Root diam: 3.30 cm MITRAL VALVE MV Area (PHT): 4.31 cm    SHUNTS MV Peak grad:  3.7 mmHg    Systemic VTI:  0.25 m MV Mean grad:  2.0 mmHg    Systemic Diam: 2.80 cm MV Vmax:       0.96 m/s MV Vmean:      65.5 cm/s MV Decel Time: 176 msec MV E velocity: 75.00 cm/s MV A velocity: 60.40 cm/s MV E/A ratio:  1.24 Bartholome Bill MD Electronically signed by Bartholome Bill MD Signature Date/Time: 11/08/2020/3:48:29 PM    Final     ASSESSMENT: Anemia in chronic kidney disease, mild iron deficiency.  PLAN:    1. Anemia: -Secondary to chronic kidney disease and iron deficiency. -He started on dialysis Monday Wednesday Friday about 3 months ago.   -  He is followed by Dr. Holley Raring. -He was last given IV iron on 08/27/2020.  -He was given Epogen on 10/11/2020 and 10/12/2020. -Labs from today show hemoglobin of 8.6, ferritin of 76, iron saturations 11%. -Proceed with IV iron x2 doses. -We will verify with Dr. Grayland Ormond about getting Retacrit.  2.  Chronic renal insufficiency:  -Followed by Dr. Holley Raring. -Currently on dialysis Monday Wednesday Friday.  3. Vascular insufficiency:  -Recently had CVA.   -He  is currently on Eliquis and low-dose statin.   -Continue follow-up with vascular surgery as scheduled.  4.  Back pain:  -Chronic and unchanged.   -Continue follow-up and treatment per physical therapy.  Disposition: -Proceed with IV Feraheme today and return in 1 week for second dose. -We will touch base with Dr. Grayland Ormond with regards to Retacrit. -RTC in 6 weeks for repeat labs (CBC, Ferritin, CMP, iron panel) and assessment.  Greater than 50% was spent in counseling and coordination of care with this patient including but not limited to discussion of the relevant topics above (See A&P) including, but not limited to diagnosis and management of acute and chronic medical conditions.   Patient expressed understanding and was in agreement with this plan. He also understands that He can call clinic at any time with any questions, concerns, or complaints.    Jacquelin Hawking, NP   11/27/2020 8:47 AM

## 2020-11-26 NOTE — Progress Notes (Signed)
1530- Patient tolerated Feraheme infusion well. Patient and vital signs stable. Patient discharged to home at this time.

## 2020-12-03 ENCOUNTER — Inpatient Hospital Stay: Payer: Medicare Other

## 2020-12-04 ENCOUNTER — Telehealth: Payer: Self-pay | Admitting: Oncology

## 2020-12-04 NOTE — Telephone Encounter (Signed)
Called pt to get missed 12/20 infusion rescheduled. Pt did not answer. Left detailed VM to contact office to reschedule appt.

## 2020-12-19 ENCOUNTER — Inpatient Hospital Stay: Payer: Medicare Other | Attending: Oncology

## 2020-12-19 ENCOUNTER — Other Ambulatory Visit: Payer: Self-pay

## 2020-12-19 VITALS — BP 109/63 | HR 73 | Temp 97.1°F | Resp 18

## 2020-12-19 DIAGNOSIS — D509 Iron deficiency anemia, unspecified: Secondary | ICD-10-CM | POA: Diagnosis not present

## 2020-12-19 MED ORDER — SODIUM CHLORIDE 0.9 % IV SOLN
510.0000 mg | Freq: Once | INTRAVENOUS | Status: AC
  Administered 2020-12-19: 510 mg via INTRAVENOUS
  Filled 2020-12-19: qty 510

## 2020-12-19 MED ORDER — SODIUM CHLORIDE 0.9 % IV SOLN
Freq: Once | INTRAVENOUS | Status: AC
  Filled 2020-12-19: qty 250

## 2020-12-19 NOTE — Progress Notes (Signed)
Pt tolerated Feraheme infusion well with no signs of complications. VSS. Pt stable for discharge.  [name]  

## 2020-12-27 ENCOUNTER — Ambulatory Visit
Payer: Medicare Other | Attending: Student in an Organized Health Care Education/Training Program | Admitting: Student in an Organized Health Care Education/Training Program

## 2020-12-27 ENCOUNTER — Other Ambulatory Visit: Payer: Self-pay

## 2020-12-27 ENCOUNTER — Encounter: Payer: Self-pay | Admitting: Student in an Organized Health Care Education/Training Program

## 2020-12-27 VITALS — BP 104/56 | HR 74 | Temp 97.2°F | Resp 16 | Ht 72.0 in | Wt 246.0 lb

## 2020-12-27 DIAGNOSIS — M5416 Radiculopathy, lumbar region: Secondary | ICD-10-CM

## 2020-12-27 DIAGNOSIS — M48061 Spinal stenosis, lumbar region without neurogenic claudication: Secondary | ICD-10-CM | POA: Diagnosis present

## 2020-12-27 DIAGNOSIS — M4326 Fusion of spine, lumbar region: Secondary | ICD-10-CM | POA: Diagnosis present

## 2020-12-27 DIAGNOSIS — M48062 Spinal stenosis, lumbar region with neurogenic claudication: Secondary | ICD-10-CM | POA: Diagnosis present

## 2020-12-27 DIAGNOSIS — M47816 Spondylosis without myelopathy or radiculopathy, lumbar region: Secondary | ICD-10-CM | POA: Diagnosis present

## 2020-12-27 DIAGNOSIS — G894 Chronic pain syndrome: Secondary | ICD-10-CM

## 2020-12-27 DIAGNOSIS — M5136 Other intervertebral disc degeneration, lumbar region: Secondary | ICD-10-CM | POA: Diagnosis present

## 2020-12-27 MED ORDER — OXYCODONE-ACETAMINOPHEN 10-325 MG PO TABS: 1.0000 | ORAL_TABLET | Freq: Three times a day (TID) | ORAL | 0 refills | Status: DC | PRN

## 2020-12-27 NOTE — Progress Notes (Addendum)
PROVIDER NOTE: Information contained herein reflects review and annotations entered in association with encounter. Interpretation of such information and data should be left to medically-trained personnel. Information provided to patient can be located elsewhere in the medical record under "Patient Instructions". Document created using STT-dictation technology, any transcriptional errors that may result from process are unintentional.    Patient: [name]  Service Category: E/M  Provider: Gillis Santa, MD  DOB: 1946-04-15  DOS: 12/27/2020  Specialty: Interventional Pain Management  MRN: [id number]  Setting: Ambulatory outpatient  PCP: Derinda Late, MD  Type: Established Patient    Referring Provider: Derinda Late, MD  Location: Office  Delivery: Face-to-face     HPI  [name], a 75 y.o. year old male, is here today because of his Lumbar radiculopathy [M54.16]. [name] primary complain today is Back Pain (Lumbar midline ) Last encounter: My last encounter with him was on 10/24/2020. Pertinent problems: [name] does not have any pertinent problems on file. Pain Assessment: Severity of Chronic pain is reported as a 0-No pain (if he gets up to start doing something it could go to a 10)/10. Location: Back Lower,Left,Right/causes legs to be weak. Onset: More than a month ago. Quality: Discomfort,Constant,Other (Comment) (weakness). Timing: Constant. Modifying factor(s): pain medication is not affective.  rest. Vitals:  height is 6' (1.829 m) and weight is 246 lb (111.6 kg). His temporal temperature is 97.2 F (36.2 C) (abnormal). His blood pressure is 104/56 (abnormal) and his pulse is 74. His respiration is 16 and oxygen saturation is 87% (abnormal).   Reason for encounter: medication management.    Is doing dialysis 3 days a week (ESRD, started 1st week of November), has consultation next week for peritoneal access for possible PD in future. Stopped doing  PT in Oct. Feels that he has regressed given that he has not done PT. Still getting acclimated to dialysis, does cause fatigue on certain days Continues Oxycodone 10 mg TID prn  Pharmacotherapy Assessment   08/30/2020  08/30/2020   2  Oxycodone-Acetaminophen 10-325  90.00  30  [place]  [id number]  Nor (2541)  0/0  45.00 MME  Medicare  [place]     Analgesic: oxycodone 10 mg 3 times daily as needed, MME 45   Monitoring:  PMP: PDMP reviewed during this encounter.       Pharmacotherapy: No side-effects or adverse reactions reported. Compliance: No problems identified. Effectiveness: Clinically acceptable.  Curtis Billow, RN  12/27/2020 12:24 PM  Signed Nursing Pain Medication Assessment:  Safety precautions to be maintained throughout the outpatient stay will include: orient to surroundings, keep bed in low position, maintain call bell within reach at all times, provide assistance with transfer out of bed and ambulation.  Medication Inspection Compliance: [name] did not comply with our request to bring his pills to be counted. He was reminded that bringing the medication bottles, even when empty, is a requirement.  Medication: None brought in. Pill/Patch Count: None available to be counted. Bottle Appearance: No container available. Did not bring bottle(s) to appointment. Filled Date: N/A Last Medication intake:  a couple of weeks ago. only takes when needed    UDS: No results found for: SUMMARY   ROS  Constitutional: Denies any fever or chills Gastrointestinal: No reported hemesis, hematochezia, vomiting, or acute GI distress Musculoskeletal: Low back pain, bilateral leg pain Neurological: No reported episodes of acute onset apraxia, aphasia, dysarthria, agnosia, amnesia, paralysis, loss of coordination, or loss of consciousness  Medication Review  DULoxetine, Oxycodone HCl, albuterol, apixaban, carvedilol, clonazePAM, glipiZIDE, lipase/protease/amylase, mirtazapine,  multivitamin, pantoprazole, rOPINIRole, rosuvastatin, and sucralfate  History Review  Allergy: [name] is allergic to bupropion. Drug: [name]  reports no history of drug use. Alcohol:  reports no history of alcohol use. Tobacco:  reports that he has quit smoking. He has a 30.00 pack-year smoking history. His smokeless tobacco use includes chew. Social: [name]  reports that he has quit smoking. He has a 30.00 pack-year smoking history. His smokeless tobacco use includes chew. He reports that he does not drink alcohol and does not use drugs. Medical:  has a past medical history of Chronic back pain, COPD (chronic obstructive pulmonary disease) ([place]), Depression, Diabetes mellitus without complication ([place]), Hyperlipidemia, Hypertension, Renal cell carcinoma ([place]), and Sleep apnea. Surgical: [name]  has a past surgical history that includes colonoscopys; Upper gi endoscopy; Flexible sigmoidoscopy; Gastric bypass; Nephrectomy; open reduction and internal fixation, right distal radius; Carpal tunnel release; right knee arthroscopy; Esophagogastroduodenoscopy (egd) with propofol (N/A, 07/29/2017); Colonoscopy with propofol (N/A, 07/29/2017); Cholecystectomy; TEMPORARY DIALYSIS CATHETER (N/A, 10/02/2020); and DIALYSIS/PERMA CATHETER INSERTION (N/A, 10/09/2020). Family: family history includes Cancer in his mother; Diabetes in his mother; Heart disease in his father.  Laboratory Chemistry Profile   Renal Lab Results  Component Value Date   BUN 56 (H) 11/09/2020   CREATININE 3.98 (H) 11/09/2020   GFRAA 47 (L) 03/26/2020   GFRNONAA 15 (L) 11/09/2020     Hepatic Lab Results  Component Value Date   AST 12 (L) 11/09/2020   ALT 14 11/09/2020   ALBUMIN 3.3 (L) 11/09/2020   ALKPHOS 112 11/09/2020   HCVAB NON REACTIVE 10/02/2020   HCVAB NON REACTIVE 10/02/2020   LIPASE 209 02/12/2014     Electrolytes Lab Results  Component Value Date   NA 144 11/09/2020   K 4.4 11/09/2020    CL 112 (H) 11/09/2020   CALCIUM 7.8 (L) 11/09/2020   MG 2.0 11/09/2020   PHOS 4.1 10/12/2020     Bone No results found for: VD25OH, [id number], PT4656CL2, XN1700FV4, 25OHVITD1, 25OHVITD2, 25OHVITD3, TESTOFREE, TESTOSTERONE   Inflammation (CRP: Acute Phase) (ESR: Chronic Phase) Lab Results  Component Value Date   LATICACIDVEN 0.5 10/02/2020       Note: Above Lab results reviewed.  Recent Imaging Review  US Carotid Bilateral (at [facility] and [facility] only) CLINICAL DATA:  75 year old male with history of stroke.  EXAM: BILATERAL CAROTID DUPLEX ULTRASOUND  TECHNIQUE: Pearline Cables scale imaging, color Doppler and duplex ultrasound were performed of bilateral carotid and vertebral arteries in the neck.  COMPARISON:  None.  FINDINGS: Criteria: Quantification of carotid stenosis is based on velocity parameters that correlate the residual internal carotid diameter with NASCET-based stenosis levels, using the diameter of the distal internal carotid lumen as the denominator for stenosis measurement.  The following velocity measurements were obtained:  RIGHT  ICA: Peak systolic velocity 944 cm/sec, End diastolic velocity 29 cm/sec  CCA: Peak systolic velocity 967 cm/sec  SYSTOLIC ICA/CCA RATIO:  0.1  ECA: Peak systolic velocity 591 cm/sec  LEFT  ICA: Peak systolic velocity 638 cm/sec, End diastolic velocity 28 cm/sec  CCA: 92 cm/sec  SYSTOLIC ICA/CCA RATIO:  1.3  ECA: 228 cm/sec  RIGHT CAROTID ARTERY: Mild multifocal circumferential atherosclerotic plaque formation about the carotid bulb and bifurcation. No significant tortuosity. Normal low resistance waveforms.  RIGHT VERTEBRAL ARTERY:  Antegrade flow.  LEFT CAROTID ARTERY: Mild multifocal circumferential atherosclerotic plaque formation about the carotid bulb and bifurcation. No significant  tortuosity. Normal low resistance waveforms.  LEFT VERTEBRAL ARTERY:  Antegrade flow.  Upper extremity non-invasive blood  pressures:  Not obtained.  IMPRESSION: 1. Right carotid artery system: Less than 50% stenosis secondary to mild atherosclerotic plaque formation.  2. Left carotid artery system: Less than 50% stenosis secondary to mild atherosclerotic plaque formation.  3.  Vertebral artery system: Patent with antegrade flow bilaterally.  Ruthann Cancer, MD  [facility]  [facility]  Electronically Signed   By: Ruthann Cancer MD   On: 11/13/2020 10:42 Note: Reviewed        Physical Exam  General appearance: Well nourished, well developed, and well hydrated. In no apparent acute distress Mental status: Alert, oriented x 3 (person, place, & time)       Respiratory: No evidence of acute respiratory distress Eyes: PERLA Vitals: BP (!) 104/56 (BP Location: Right Arm, Patient Position: Sitting, Cuff Size: Large)   Pulse 74   Temp (!) 97.2 F (36.2 C) (Temporal)   Resp 16   Ht 6' (1.829 m)   Wt 246 lb (111.6 kg)   SpO2 (!) 87%   BMI 33.36 kg/m  BMI: Estimated body mass index is 33.36 kg/m as calculated from the following:   Height as of this encounter: 6' (1.829 m).   Weight as of this encounter: 246 lb (111.6 kg). Ideal: Ideal body weight: 77.6 kg (171 lb 1.2 oz) Adjusted ideal body weight: 91.2 kg (201 lb 0.7 oz)  Lumbar Spine Area Exam  Skin & Axial Inspection: No masses, redness, or swelling Alignment: Symmetrical Functional ROM: Decreased ROM       Stability: No instability detected Muscle Tone/Strength: Functionally intact. No obvious neuro-muscular anomalies detected. Sensory (Neurological): Musculoskeletal pain pattern  Gait & Posture Assessment  Ambulation: Limited Gait: Antalgic gait (limping) Posture: Difficulty standing up straight, due to pain  Lower Extremity Exam    Side: Right lower extremity  Side: Left lower extremity  Stability: No instability observed          Stability: No instability observed          Skin &  Extremity Inspection: Skin color, temperature, and hair growth are WNL. No peripheral edema or cyanosis. No masses, redness, swelling, asymmetry, or associated skin lesions. No contractures.  Skin & Extremity Inspection: Skin color, temperature, and hair growth are WNL. No peripheral edema or cyanosis. No masses, redness, swelling, asymmetry, or associated skin lesions. No contractures.  Functional ROM: Pain restricted ROM for all joints of the lower extremity          Functional ROM: Pain restricted ROM for all joints of the lower extremity          Muscle Tone/Strength: Mild-to-moderate deconditioning  Muscle Tone/Strength: Mild-to-moderate deconditioning  Sensory (Neurological): Neurogenic pain pattern        Sensory (Neurological): Neurogenic pain pattern          Assessment   Status Diagnosis  Recurring Persistent Persistent 1. Lumbar radiculopathy   2. Bilateral stenosis of lateral recess of lumbar spine   3. Neuroforaminal stenosis of lumbar spine   4. Spinal stenosis, lumbar region, with neurogenic claudication   5. Lumbar degenerative disc disease   6. Ankylosis of lumbar spine   7. Lumbar facet arthropathy   8. Chronic pain syndrome       Plan of Care   Mr. Saharsh Sterling has a current medication list which includes the following long-term medication(s): apixaban, carvedilol, duloxetine, glipizide, mirtazapine, pantoprazole, ropinirole, rosuvastatin,  and sucralfate.  Addendum on 01/05/2021: Patient has been advised by his nephrologist to avoid acetaminophen.  As a result we will change his Percocet to oxycodone 10 mg 3 times daily as needed.  Pharmacotherapy (Medications Ordered): Meds ordered this encounter  Medications  . DISCONTD: oxyCODONE-acetaminophen (PERCOCET) 10-325 MG tablet    Sig: Take 1 tablet by mouth every 8 (eight) hours as needed for pain. Must last 30 days.    Dispense:  90 tablet    Refill:  0    Chronic Pain. (STOP Act - Not applicable). Fill  one day early if closed on scheduled refill date.  Marland Kitchen DISCONTD: oxyCODONE-acetaminophen (PERCOCET) 10-325 MG tablet    Sig: Take 1 tablet by mouth every 8 (eight) hours as needed for pain. Must last 30 days.    Dispense:  90 tablet    Refill:  0    Chronic Pain. (STOP Act - Not applicable). Fill one day early if closed on scheduled refill date.  Marland Kitchen DISCONTD: oxyCODONE-acetaminophen (PERCOCET) 10-325 MG tablet    Sig: Take 1 tablet by mouth every 8 (eight) hours as needed for pain. Must last 30 days.    Dispense:  90 tablet    Refill:  0    Chronic Pain. (STOP Act - Not applicable). Fill one day early if closed on scheduled refill date.  . Oxycodone HCl 10 MG TABS    Sig: Take 1 tablet (10 mg total) by mouth 3 (three) times daily as needed. Must last 30 days.    Dispense:  90 tablet    Refill:  0    Chronic Pain. (STOP Act - Not applicable). Fill one day early if closed on scheduled refill date.  . Oxycodone HCl 10 MG TABS    Sig: Take 1 tablet (10 mg total) by mouth 3 (three) times daily as needed. Must last 30 days.    Dispense:  90 tablet    Refill:  0    Chronic Pain. (STOP Act - Not applicable). Fill one day early if closed on scheduled refill date.  . Oxycodone HCl 10 MG TABS    Sig: Take 1 tablet (10 mg total) by mouth 3 (three) times daily as needed. Must last 30 days.    Dispense:  90 tablet    Refill:  0    Chronic Pain. (STOP Act - Not applicable). Fill one day early if closed on scheduled refill date.    Follow-up plan:   Return in about 3 months (around 03/20/2021) for Medication Management, in person.   Recent Visits Date Type Provider Dept  12/27/20 Office Visit Gillis Santa, MD [facility]  10/29/20 Telemedicine Gillis Santa, MD [facility]  Showing recent visits within past 90 days and meeting all other requirements Future Appointments Date Type Provider Dept  03/14/21 Appointment Gillis Santa, MD [facility]  Showing future  appointments within next 90 days and meeting all other requirements  I discussed the assessment and treatment plan with the patient. The patient was provided an opportunity to ask questions and all were answered. The patient agreed with the plan and demonstrated an understanding of the instructions.  Patient advised to call back or seek an in-person evaluation if the symptoms or condition worsens.  Duration of encounter: 30 minutes.  Note by: Gillis Santa, MD Date: 12/27/2020; Time: 3:09 PM

## 2020-12-27 NOTE — Progress Notes (Signed)
Nursing Pain Medication Assessment:  Safety precautions to be maintained throughout the outpatient stay will include: orient to surroundings, keep bed in low position, maintain call bell within reach at all times, provide assistance with transfer out of bed and ambulation.  Medication Inspection Compliance: [name] did not comply with our request to bring his pills to be counted. He was reminded that bringing the medication bottles, even when empty, is a requirement.  Medication: None brought in. Pill/Patch Count: None available to be counted. Bottle Appearance: No container available. Did not bring bottle(s) to appointment. Filled Date: N/A Last Medication intake:  a couple of weeks ago. only takes when needed

## 2021-01-03 ENCOUNTER — Telehealth: Payer: Self-pay | Admitting: Student in an Organized Health Care Education/Training Program

## 2021-01-03 MED ORDER — OXYCODONE HCL 10 MG PO TABS
10.0000 mg | ORAL_TABLET | Freq: Three times a day (TID) | ORAL | 0 refills | Status: AC | PRN
Start: 2021-01-03 — End: 2021-02-02

## 2021-01-03 MED ORDER — OXYCODONE HCL 10 MG PO TABS: 10.0000 mg | ORAL_TABLET | Freq: Three times a day (TID) | ORAL | 0 refills | Status: DC | PRN

## 2021-01-03 MED ORDER — OXYCODONE HCL 10 MG PO TABS: 10.0000 mg | ORAL_TABLET | Freq: Three times a day (TID) | ORAL | 0 refills | Status: AC | PRN

## 2021-01-03 NOTE — Telephone Encounter (Signed)
Called patient and spoke with wife. She states due to his dialysis, the nephrologist does NOT want him to take tylenol. Per Dr. Holley Raring, we will cancel the Percocet and call in the OXY. Patient called and made aware. Pharmacy called and the percocet scripts were cancelled.

## 2021-01-03 NOTE — Telephone Encounter (Signed)
Patient called stating the wrong meds script were sent in. It was supposed to be Roxycodone. Please call patient asap

## 2021-01-03 NOTE — Addendum Note (Signed)
Addended by: Gillis Santa on: 01/03/2021 03:09 PM   Modules accepted: Orders

## 2021-01-05 NOTE — Progress Notes (Deleted)
[place]  [phone] [phone]  ID: [name] OB: Feb 19, 1946  MR#: [id number]  JIR#:[id number]  Patient Care Team: Derinda Late, MD as PCP - General (Family Medicine)   CHIEF COMPLAINT: Anemia in chronic kidney disease, mild iron deficiency.  INTERVAL HISTORY: Patient returns to clinic today for repeat laboratory work, further evaluation, and continuation of treatment.  He has noticed increased weakness and fatigue recently.  He continues to have chronic back pain, but this has been helped by physical therapy.  He otherwise feels well.  He has no neurologic complaints.  He denies any recent fevers or illnesses.  He has a good appetite and denies weight loss.  He denies any chest pain, shortness of breath, cough, or hemoptysis.  He denies any abdominal pain.  He has no nausea, vomiting, constipation, or diarrhea.  He denies any melena or hematochezia.  He has no urinary complaints.  Patient offers no further specific complaints today.   REVIEW OF SYSTEMS:   Review of Systems  Constitutional: Positive for malaise/fatigue. Negative for fever and weight loss.  Respiratory: Negative.  Negative for cough, hemoptysis and shortness of breath.   Cardiovascular: Negative.  Negative for chest pain and leg swelling.  Gastrointestinal: Negative.  Negative for abdominal pain and constipation.  Genitourinary: Negative.  Negative for flank pain and hematuria.  Musculoskeletal: Positive for back pain.  Skin: Negative.  Negative for rash.  Neurological: Positive for weakness. Negative for dizziness, sensory change, focal weakness and headaches.  Psychiatric/Behavioral: Negative.  The patient is not nervous/anxious.     As per HPI. Otherwise, a complete review of systems is negative.  PAST MEDICAL HISTORY: Past Medical History:  Diagnosis Date  . Chronic back pain   . COPD (chronic obstructive pulmonary disease) ([place])   . Depression   .  Diabetes mellitus without complication ([place])   . Hyperlipidemia   . Hypertension   . Renal cell carcinoma ([place])   . Sleep apnea     PAST SURGICAL HISTORY: Past Surgical History:  Procedure Laterality Date  . CARPAL TUNNEL RELEASE    . CHOLECYSTECTOMY    . COLONOSCOPY WITH PROPOFOL N/A 07/29/2017   Procedure: COLONOSCOPY WITH PROPOFOL;  Surgeon: Manya Silvas, MD;  Location: [facility] ENDOSCOPY;  Service: Endoscopy;  Laterality: N/A;  . colonoscopys    . DIALYSIS/PERMA CATHETER INSERTION N/A 10/09/2020   Procedure: DIALYSIS/PERMA CATHETER INSERTION;  Surgeon: Katha Cabal, MD;  Location: [place] [facility];  Service: Cardiovascular;  Laterality: N/A;  . ESOPHAGOGASTRODUODENOSCOPY (EGD) WITH PROPOFOL N/A 07/29/2017   Procedure: ESOPHAGOGASTRODUODENOSCOPY (EGD) WITH PROPOFOL;  Surgeon: Manya Silvas, MD;  Location: [facility] ENDOSCOPY;  Service: Endoscopy;  Laterality: N/A;  . FLEXIBLE SIGMOIDOSCOPY    . GASTRIC BYPASS    . NEPHRECTOMY    . open reduction and internal fixation, right distal radius    . right knee arthroscopy    . TEMPORARY DIALYSIS CATHETER N/A 10/02/2020   Procedure: TEMPORARY DIALYSIS CATHETER;  Surgeon: Katha Cabal, MD;  Location: [place] [facility];  Service: Cardiovascular;  Laterality: N/A;  . UPPER GI ENDOSCOPY      FAMILY HISTORY: Family History  Problem Relation Age of Onset  . Diabetes Mother   . Cancer Mother   . Heart disease Father     ADVANCED DIRECTIVES (Y/N):  N  HEALTH MAINTENANCE: Social History   Tobacco Use  . Smoking status: Former Smoker    Packs/day: 1.00    Years: 30.00  Pack years: 30.00  . Smokeless tobacco: Current User    Types: Chew  Vaping Use  . Vaping Use: Never used  Substance Use Topics  . Alcohol use: No  . Drug use: No     Colonoscopy:  PAP:  Bone density:  Lipid panel:  Allergies  Allergen Reactions  . Bupropion     Other reaction(s): Other (See Comments) Mood swings, angry    Current  Outpatient Medications  Medication Sig Dispense Refill  . albuterol (VENTOLIN HFA) 108 (90 Base) MCG/ACT inhaler Inhale 2 puffs into the lungs every 6 (six) hours as needed for wheezing.     Marland Kitchen apixaban (ELIQUIS) 2.5 MG TABS tablet Take 1 tablet (2.5 mg total) by mouth 2 (two) times daily. 60 tablet 0  . carvedilol (COREG) 6.25 MG tablet Take 6.25 mg by mouth 2 (two) times daily with a meal.    . clonazePAM (KLONOPIN) 0.5 MG tablet Take 0.5 mg by mouth 2 (two) times daily as needed.    . DULoxetine (CYMBALTA) 60 MG capsule Take 120 mg by mouth daily.     Marland Kitchen glipiZIDE (GLUCOTROL XL) 10 MG 24 hr tablet Take 10 mg by mouth daily.    . lipase/protease/amylase (CREON) 36000 UNITS CPEP capsule Take 2 capsules by mouth 3 (three) times daily with meals.    . mirtazapine (REMERON) 45 MG tablet Take 45 mg by mouth at bedtime.     . multivitamin (RENA-VIT) TABS tablet Take 1 tablet by mouth at bedtime. 30 tablet 0  . Oxycodone HCl 10 MG TABS Take 1 tablet (10 mg total) by mouth 3 (three) times daily as needed. Must last 30 days. 90 tablet 0  . [START ON 02/02/2021] Oxycodone HCl 10 MG TABS Take 1 tablet (10 mg total) by mouth 3 (three) times daily as needed. Must last 30 days. 90 tablet 0  . [START ON 03/04/2021] Oxycodone HCl 10 MG TABS Take 1 tablet (10 mg total) by mouth 3 (three) times daily as needed. Must last 30 days. 90 tablet 0  . pantoprazole (PROTONIX) 40 MG tablet Take 40 mg by mouth 2 (two) times daily.     Marland Kitchen rOPINIRole (REQUIP) 4 MG tablet Take 1 tablet (4 mg total) by mouth 2 (two) times daily before a meal. 60 tablet 0  . rosuvastatin (CRESTOR) 10 MG tablet Take 1 tablet (10 mg total) by mouth daily. 30 tablet 2  . sucralfate (CARAFATE) 1 g tablet Take 1 g by mouth 3 (three) times daily.      No current facility-administered medications for this visit.    OBJECTIVE: There were no vitals filed for this visit.   There is no height or weight on file to calculate BMI.    ECOG FS:0 -  Asymptomatic  General: Well-developed, well-nourished, no acute distress. Eyes: Pink conjunctiva, anicteric sclera. HEENT: Normocephalic, moist mucous membranes. Lungs: No audible wheezing or coughing. Heart: Regular rate and rhythm. Abdomen: Soft, nontender, no obvious distention. Musculoskeletal: No edema, cyanosis, or clubbing. Neuro: Alert, answering all questions appropriately. Cranial nerves grossly intact. Skin: No rashes or petechiae noted. Psych: Normal affect.  LAB RESULTS:  Lab Results  Component Value Date   NA 144 11/09/2020   K 4.4 11/09/2020   CL 112 (H) 11/09/2020   CO2 23 11/09/2020   GLUCOSE 126 (H) 11/09/2020   BUN 56 (H) 11/09/2020   CREATININE 3.98 (H) 11/09/2020   CALCIUM 7.8 (L) 11/09/2020   PROT 6.1 (L) 11/09/2020   ALBUMIN 3.3 (L)  11/09/2020   AST 12 (L) 11/09/2020   ALT 14 11/09/2020   ALKPHOS 112 11/09/2020   BILITOT 0.5 11/09/2020   GFRNONAA 15 (L) 11/09/2020   GFRAA 47 (L) 03/26/2020    Lab Results  Component Value Date   WBC 7.4 11/23/2020   NEUTROABS 5.7 11/23/2020   HGB 8.6 (L) 11/23/2020   HCT 25.2 (L) 11/23/2020   MCV 90.6 11/23/2020   PLT 206 11/23/2020   Lab Results  Component Value Date   IRON 34 (L) 11/23/2020   TIBC 304 11/23/2020   IRONPCTSAT 11 (L) 11/23/2020   Lab Results  Component Value Date   FERRITIN 76 11/23/2020     STUDIES: No results found.  ASSESSMENT: Anemia in chronic kidney disease, mild iron deficiency.  PLAN:    1. Anemia in chronic kidney disease, mild iron deficiency: Patient's hemoglobin has trended down to 10.1 and he is mildly symptomatic.  Proceed with 510 mg IV Feraheme today.  He does not require second infusion. He does not require Retacrit at this time, but given his chronic renal insufficiency this may be needed in the future if his hemoglobin falls persistently below 10 with normal iron stores.  Return to clinic in 3 months with repeat laboratory work, further evaluation, and  continuation of treatment if needed. 2.  Chronic renal insufficiency: Chronic and unchanged.  Appreciate nephrology input.   3.  Vascular insufficiency: Continue follow-up with vascular surgery as scheduled. 4.  Back pain: Chronic and unchanged.  Continue follow-up and treatment per physical therapy.  I spent a total of 30 minutes reviewing chart data, face-to-face evaluation with the patient, counseling and coordination of care as detailed above.   Patient expressed understanding and was in agreement with this plan. He also understands that He can call clinic at any time with any questions, concerns, or complaints.    Lloyd Huger, MD   01/05/2021 2:00 PM

## 2021-01-07 ENCOUNTER — Inpatient Hospital Stay: Payer: Medicare Other

## 2021-01-08 ENCOUNTER — Inpatient Hospital Stay: Payer: Medicare Other | Admitting: Oncology

## 2021-01-08 ENCOUNTER — Inpatient Hospital Stay: Payer: Medicare Other

## 2021-01-08 DIAGNOSIS — D509 Iron deficiency anemia, unspecified: Secondary | ICD-10-CM

## 2021-01-22 ENCOUNTER — Encounter (INDEPENDENT_AMBULATORY_CARE_PROVIDER_SITE_OTHER): Payer: Self-pay | Admitting: Vascular Surgery

## 2021-01-22 ENCOUNTER — Ambulatory Visit (INDEPENDENT_AMBULATORY_CARE_PROVIDER_SITE_OTHER): Payer: Medicare Other | Admitting: Vascular Surgery

## 2021-01-22 ENCOUNTER — Other Ambulatory Visit: Payer: Self-pay

## 2021-01-22 VITALS — BP 156/78 | HR 85 | Resp 16 | Wt 249.0 lb

## 2021-01-22 DIAGNOSIS — Z992 Dependence on renal dialysis: Secondary | ICD-10-CM

## 2021-01-22 DIAGNOSIS — I890 Lymphedema, not elsewhere classified: Secondary | ICD-10-CM | POA: Diagnosis not present

## 2021-01-22 DIAGNOSIS — N186 End stage renal disease: Secondary | ICD-10-CM | POA: Diagnosis not present

## 2021-01-22 DIAGNOSIS — E1122 Type 2 diabetes mellitus with diabetic chronic kidney disease: Secondary | ICD-10-CM

## 2021-01-22 DIAGNOSIS — I10 Essential (primary) hypertension: Secondary | ICD-10-CM

## 2021-01-22 NOTE — Assessment & Plan Note (Signed)
Symptoms are under relatively good control now.  Starting dialysis and removing fluid has certainly been a significant improvement in his leg swelling.  He is also wearing his compression stockings and elevating his legs.  I will plan to see him back in 6 months.

## 2021-01-22 NOTE — Assessment & Plan Note (Signed)
The patient has now been on dialysis for about 3 to 4 months.  Although this has resulted in marked improvement in his leg swelling, it is rough on him.  He is still getting hemodialysis through his catheter.  We discussed options for dialysis today.  He is planning on proceeding with peritoneal dialysis in the near future.  I discussed that is certainly a good option hope it works well for him.  If this does not work for him, and he needs extremity access for hemodialysis, we will be happy to do a vein mapping and assess him for that.

## 2021-01-22 NOTE — Progress Notes (Signed)
MRN : [id number]  [name] is a 75 y.o. (02-01-1946) male who presents with chief complaint of  Chief Complaint  Patient presents with  . Follow-up    90month follow up  .  History of Present Illness: Patient returns today in follow up of his leg swelling.  Since his last visit, he has gone into renal failure and is now on dialysis.  His leg swelling is markedly improved in large part by reduction of fluid with dialysis.  He is also wearing his compression stockings regularly and elevating his legs.  His left leg does still have some swelling, but it is better. As for his dialysis access, he is currently using his PermCath that was placed back in October.  This is working well.  Is not had any major issues or problems.  No signs of infection.  He was considering hemodialysis versus peritoneal dialysis, and has elected to proceed with peritoneal dialysis.  Current Outpatient Medications  Medication Sig Dispense Refill  . albuterol (VENTOLIN HFA) 108 (90 Base) MCG/ACT inhaler Inhale 2 puffs into the lungs every 6 (six) hours as needed for wheezing.     Marland Kitchen apixaban (ELIQUIS) 2.5 MG TABS tablet Take 1 tablet (2.5 mg total) by mouth 2 (two) times daily. 60 tablet 0  . carvedilol (COREG) 6.25 MG tablet Take 6.25 mg by mouth 2 (two) times daily with a meal.    . clonazePAM (KLONOPIN) 0.5 MG tablet Take 0.5 mg by mouth 2 (two) times daily as needed.    . DULoxetine (CYMBALTA) 60 MG capsule Take 120 mg by mouth daily.     Marland Kitchen ezetimibe (ZETIA) 10 MG tablet Take by mouth.    Marland Kitchen glipiZIDE (GLUCOTROL XL) 10 MG 24 hr tablet Take 10 mg by mouth daily.    . lipase/protease/amylase (CREON) 36000 UNITS CPEP capsule Take 2 capsules by mouth 3 (three) times daily with meals.    . mirtazapine (REMERON) 45 MG tablet Take 45 mg by mouth at bedtime.     . multivitamin (RENA-VIT) TABS tablet Take 1 tablet by mouth at bedtime. 30 tablet 0  . [START ON 02/02/2021] Oxycodone HCl 10 MG TABS Take 1 tablet (10  mg total) by mouth 3 (three) times daily as needed. Must last 30 days. (Patient taking differently: Take 5 mg by mouth 3 (three) times daily as needed. Must last 30 days.) 90 tablet 0  . pantoprazole (PROTONIX) 40 MG tablet Take 40 mg by mouth 2 (two) times daily.     Marland Kitchen rOPINIRole (REQUIP) 4 MG tablet Take 1 tablet (4 mg total) by mouth 2 (two) times daily before a meal. 60 tablet 0  . sucralfate (CARAFATE) 1 g tablet Take 1 g by mouth 3 (three) times daily.     . Oxycodone HCl 10 MG TABS Take 1 tablet (10 mg total) by mouth 3 (three) times daily as needed. Must last 30 days. (Patient not taking: Reported on 01/22/2021) 90 tablet 0  . [START ON 03/04/2021] Oxycodone HCl 10 MG TABS Take 1 tablet (10 mg total) by mouth 3 (three) times daily as needed. Must last 30 days. (Patient not taking: Reported on 01/22/2021) 90 tablet 0  . rosuvastatin (CRESTOR) 10 MG tablet Take 1 tablet (10 mg total) by mouth daily. (Patient not taking: Reported on 01/22/2021) 30 tablet 2   No current facility-administered medications for this visit.    Past Medical History:  Diagnosis Date  . Chronic back pain   . COPD (  chronic obstructive pulmonary disease) ([place])   . Depression   . Diabetes mellitus without complication ([place])   . Hyperlipidemia   . Hypertension   . Renal cell carcinoma ([place])   . Sleep apnea     Past Surgical History:  Procedure Laterality Date  . CARPAL TUNNEL RELEASE    . CHOLECYSTECTOMY    . COLONOSCOPY WITH PROPOFOL N/A 07/29/2017   Procedure: COLONOSCOPY WITH PROPOFOL;  Surgeon: Manya Silvas, MD;  Location: [facility] ENDOSCOPY;  Service: Endoscopy;  Laterality: N/A;  . colonoscopys    . DIALYSIS/PERMA CATHETER INSERTION N/A 10/09/2020   Procedure: DIALYSIS/PERMA CATHETER INSERTION;  Surgeon: Katha Cabal, MD;  Location: [facility] LAB;  Service: Cardiovascular;  Laterality: N/A;  . ESOPHAGOGASTRODUODENOSCOPY (EGD) WITH PROPOFOL N/A 07/29/2017   Procedure: ESOPHAGOGASTRODUODENOSCOPY  (EGD) WITH PROPOFOL;  Surgeon: Manya Silvas, MD;  Location: [facility] ENDOSCOPY;  Service: Endoscopy;  Laterality: N/A;  . FLEXIBLE SIGMOIDOSCOPY    . GASTRIC BYPASS    . NEPHRECTOMY    . open reduction and internal fixation, right distal radius    . right knee arthroscopy    . TEMPORARY DIALYSIS CATHETER N/A 10/02/2020   Procedure: TEMPORARY DIALYSIS CATHETER;  Surgeon: Katha Cabal, MD;  Location: [facility];  Service: Cardiovascular;  Laterality: N/A;  . UPPER GI ENDOSCOPY       Social History   Tobacco Use  . Smoking status: Former Smoker    Packs/day: 1.00    Years: 30.00    Pack years: 30.00  . Smokeless tobacco: Current User    Types: Chew  Vaping Use  . Vaping Use: Never used  Substance Use Topics  . Alcohol use: No  . Drug use: No      Family History  Problem Relation Age of Onset  . Diabetes Mother   . Cancer Mother   . Heart disease Father      Allergies  Allergen Reactions  . Bupropion     Other reaction(s): Other (See Comments) Mood swings, angry Other reaction(s): Other (See Comments) Mood swings, angry    REVIEW OF SYSTEMS (Negative unless checked) Constitutional: [] ????Weight loss[] ????Fever[] ????Chills Cardiac:[] ????Chest pain[] ????Atrial Fibrillation[] ????Palpitations [] ????Shortness of breath when laying flat [] ????Shortness of breath with exertion. [] ????Shortness of breath at rest Vascular: [] ????Pain in legs with walking[] ????Pain in legswith standing[] ????Pain in legs when laying flat [] ????Claudication [] ????Pain in feet when laying flat [] ????History of DVT [] ????Phlebitis [x] ????Swelling in legs [x] ????Varicose veins [] ????Non-healing ulcers Pulmonary: [] ????Uses home oxygen [] ????Productive cough[] ????Hemoptysis [] ????Wheeze [] ????COPD [] ????Asthma Neurologic: [] ????Dizziness[] ????Seizures [] ????Blackouts[] ????History of stroke [] ????History of TIA[] ????Aphasia  [] ????Temporary Blindness[] ????Weaknessor numbness in arm [] ????Weakness or numbnessin leg Musculoskeletal:[] ????Joint swelling [] ????Joint pain [x] ????Low back pain [] ????History of Knee Replacement [] ????Arthritis [] ????back Surgeries[] ????Spinal Stenosis  Hematologic:[] ????Easy bruising[] ????Easy bleeding [] ????Hypercoagulable state [] ????Anemic Gastrointestinal:[] ????Diarrhea [] ????Vomiting[] ????Gastroesophageal reflux/heartburn[] ????Difficulty swallowing. [] ????Abdominal pain Genitourinary: [] ????Chronic kidney disease [] ????Difficulturination [] ????Anuric[] ????Blood in urine [] ????Frequenturination [] ????Burning with urination[] ????Hematuria Skin: [] ????Rashes [] ????Ulcers [x] ????Wounds Psychological: [x] ????History of anxiety[] ????History of major depression [] ????Memory Difficulties  Physical Examination  BP (!) 156/78 (BP Location: Right Arm)   Pulse 85   Resp 16   Wt 249 lb (112.9 kg)   BMI 33.77 kg/m  Gen:  WD/WN, NAD Head: [place]/AT, No temporalis wasting. Ear/Nose/Throat: Hearing grossly intact, nares w/o erythema or drainage Eyes: Conjunctiva clear. Sclera non-icteric Neck: Supple.  Trachea midline Pulmonary:  Good air movement, no use of accessory muscles.  Cardiac: RRR, no JVD Vascular: permcath in place right chest, no erythema Vessel Right Left  Radial Palpable Palpable  PT Palpable Palpable  DP Palpable Palpable   Gastrointestinal: soft, non-tender/non-distended. No guarding/reflex.  Musculoskeletal: M/S 5/5 throughout.  No deformity or atrophy. No RLE edema, 1+ LLE edema. Neurologic: Sensation grossly intact in extremities.  Symmetrical.  Speech is fluent.  Psychiatric: Judgment intact, Mood & affect appropriate for pt's clinical situation. Dermatologic: No rashes or ulcers noted.  No cellulitis or open wounds.       Labs Recent Results (from the past 2160 hour(s))   Protime-INR     Status: None   Collection Time: 11/07/20  5:33 PM  Result Value Ref Range   Prothrombin Time 13.5 11.4 - 15.2 seconds   INR 1.1 0.8 - 1.2    Comment: (NOTE) INR goal varies based on device and disease states. Performed at [facility], [address] [place], [place] [address]   APTT     Status: Abnormal   Collection Time: 11/07/20  5:33 PM  Result Value Ref Range   aPTT 38 (H) 24 - 36 seconds    Comment:        IF BASELINE aPTT IS ELEVATED, SUGGEST PATIENT RISK ASSESSMENT BE USED TO DETERMINE APPROPRIATE ANTICOAGULANT THERAPY. Performed at [facility], [address] [place], [place] [address]   CBC     Status: Abnormal   Collection Time: 11/07/20  5:33 PM  Result Value Ref Range   WBC 8.6 4.0 - 10.5 K/uL   RBC 2.80 (L) 4.22 - 5.81 MIL/uL   Hemoglobin 8.6 (L) 13.0 - 17.0 g/dL   HCT 26.4 (L) 39.0 - 52.0 %   MCV 94.3 80.0 - 100.0 fL   MCH 30.7 26.0 - 34.0 pg   MCHC 32.6 30.0 - 36.0 g/dL   RDW 13.9 11.5 - 15.5 %   Platelets 139 (L) 150 - 400 K/uL   nRBC 0.0 0.0 - 0.2 %    Comment: Performed at [facility], [address] [place], [place] [address]  Differential     Status: None   Collection Time: 11/07/20  5:33 PM  Result Value Ref Range   Neutrophils Relative % 77 %   Neutro Abs 6.6 1.7 - 7.7 K/uL   Lymphocytes Relative 16 %   Lymphs Abs 1.4 0.7 - 4.0 K/uL   Monocytes Relative 5 %   Monocytes Absolute 0.4 0.1 - 1.0 K/uL   Eosinophils Relative 2 %   Eosinophils Absolute 0.2 0.0 - 0.5 K/uL   Basophils Relative 0 %   Basophils Absolute 0.0 0.0 - 0.1 K/uL   Immature Granulocytes 0 %   Abs Immature Granulocytes 0.03 0.00 - 0.07 K/uL    Comment: Performed at [facility], [address] [place],  [address]  Comprehensive metabolic panel     Status: Abnormal   Collection Time: 11/07/20  5:33 PM  Result Value Ref Range   Sodium 143 135 - 145 mmol/L   Potassium 3.1 (L) 3.5 - 5.1 mmol/L   Chloride 108  98 - 111 mmol/L   CO2 23 22 - 32 mmol/L   Glucose, Bld 152 (H) 70 - 99 mg/dL    Comment: Glucose reference range applies only to samples taken after fasting for at least 8 hours.   BUN 47 (H) 8 - 23 mg/dL   Creatinine, Ser 3.84 (H) 0.61 - 1.24 mg/dL   Calcium 7.7 (L) 8.9 - 10.3 mg/dL   Total Protein 6.8 6.5 - 8.1 g/dL   Albumin 3.7 3.5 - 5.0 g/dL   AST 15  15 - 41 U/L   ALT 14 0 - 44 U/L   Alkaline Phosphatase 117 38 - 126 U/L   Total Bilirubin 0.4 0.3 - 1.2 mg/dL   GFR, Estimated 16 (L) >60 mL/min    Comment: (NOTE) Calculated using the CKD-EPI Creatinine Equation (2021)    Anion gap 12 5 - 15    Comment: Performed at [facility], [address], [place], [place] [address]  Magnesium     Status: Abnormal   Collection Time: 11/07/20  5:33 PM  Result Value Ref Range   Magnesium 1.6 (L) 1.7 - 2.4 mg/dL    Comment: Performed at [facility], [address], [place], [place] [address]  Resp Panel by RT-PCR (Flu A&B, Covid) Nasopharyngeal Swab     Status: None   Collection Time: 11/07/20 10:38 PM   Specimen: Nasopharyngeal Swab; Nasopharyngeal(NP) swabs in vial transport medium  Result Value Ref Range   SARS Coronavirus 2 by RT PCR NEGATIVE NEGATIVE    Comment: (NOTE) SARS-CoV-2 target nucleic acids are NOT DETECTED.  The SARS-CoV-2 RNA is generally detectable in upper respiratory specimens during the acute phase of infection. The lowest concentration of SARS-CoV-2 viral copies this assay can detect is 138 copies/mL. A negative result does not preclude SARS-Cov-2 infection and should not be used as the sole basis for treatment or other patient management decisions. A negative result may occur with  improper specimen collection/handling, submission of specimen other than nasopharyngeal swab, presence of viral mutation(s) within the areas targeted by this assay, and inadequate number of viral copies(<138 copies/mL). A negative result must be combined  with clinical observations, patient history, and epidemiological information. The expected result is Negative.  Fact Sheet for Patients:  EntrepreneurPulse.com.au  Fact Sheet for Healthcare Providers:  IncredibleEmployment.be  This test is no t yet approved or cleared by the [place] FDA and  has been authorized for detection and/or diagnosis of SARS-CoV-2 by FDA under an Emergency Use Authorization (EUA). This EUA will remain  in effect (meaning this test can be used) for the duration of the COVID-19 declaration under Section 564(b)(1) of the Act, 21 U.S.C.section 360bbb-3(b)(1), unless the authorization is terminated  or revoked sooner.       Influenza A by PCR NEGATIVE NEGATIVE   Influenza B by PCR NEGATIVE NEGATIVE    Comment: (NOTE) The Xpert Xpress SARS-CoV-2/FLU/RSV plus assay is intended as an aid in the diagnosis of influenza from Nasopharyngeal swab specimens and should not be used as a sole basis for treatment. Nasal washings and aspirates are unacceptable for Xpert Xpress SARS-CoV-2/FLU/RSV testing.  Fact Sheet for Patients: EntrepreneurPulse.com.au  Fact Sheet for Healthcare Providers: IncredibleEmployment.be  This test is not yet approved or cleared by the [place] FDA and has been authorized for detection and/or diagnosis of SARS-CoV-2 by FDA under an Emergency Use Authorization (EUA). This EUA will remain in effect (meaning this test can be used) for the duration of the COVID-19 declaration under Section 564(b)(1) of the Act, 21 U.S.C. section 360bbb-3(b)(1), unless the authorization is terminated or revoked.  Performed at [facility], [address] [place], [place] [address]   Hemoglobin A1c     Status: None   Collection Time: 11/08/20  4:39 AM  Result Value Ref Range   Hgb A1c MFr Bld 5.6 4.8 - 5.6 %    Comment: (NOTE) Pre diabetes:           5.7%-6.4%  Diabetes:              >  6.4%  Glycemic control for   <7.0% adults with diabetes    Mean Plasma Glucose 114.02 mg/dL    Comment: Performed at [facility] [address], [place], [place] [address]  Lipid panel     Status: Abnormal   Collection Time: 11/08/20  4:39 AM  Result Value Ref Range   Cholesterol 136 0 - 200 mg/dL   Triglycerides 149 <150 mg/dL   HDL 29 (L) >40 mg/dL   Total CHOL/HDL Ratio 4.7 RATIO   VLDL 30 0 - 40 mg/dL   LDL Cholesterol 77 0 - 99 mg/dL    Comment:        Total Cholesterol/HDL:CHD Risk Coronary Heart Disease Risk Table                     Men   Women  1/2 Average Risk   3.4   3.3  Average Risk       5.0   4.4  2 X Average Risk   9.6   7.1  3 X Average Risk  23.4   11.0        Use the calculated Patient Ratio above and the CHD Risk Table to determine the patient's CHD Risk.        ATP III CLASSIFICATION (LDL):  <100     mg/dL   Optimal  100-129  mg/dL   Near or Above                    Optimal  130-159  mg/dL   Borderline  160-189  mg/dL   High  >190     mg/dL   Very High Performed at [facility], [address] [place], [place] [address]   Glucose, capillary     Status: None   Collection Time: 11/08/20  8:04 AM  Result Value Ref Range   Glucose-Capillary 88 70 - 99 mg/dL    Comment: Glucose reference range applies only to samples taken after fasting for at least 8 hours.  ECHOCARDIOGRAM COMPLETE     Status: None   Collection Time: 11/08/20 10:44 AM  Result Value Ref Range   Weight 3,920 oz   Height 72 in   BP 132/80 mmHg   Ao pk vel 1.21 m/s   AV Area VTI 6.60 cm2   AR max vel 6.21 cm2   AV Mean grad 3.0 mmHg   AV Peak grad 5.9 mmHg   Single Plane A4C EF 56.9 %   S' Lateral 2.89 cm   AV Area mean vel 5.98 cm2   Area-P 1/2 4.31 cm2  Glucose, capillary     Status: Abnormal   Collection Time: 11/08/20 12:00 PM  Result Value Ref Range   Glucose-Capillary 124 (H) 70 - 99 mg/dL    Comment: Glucose  reference range applies only to samples taken after fasting for at least 8 hours.  Glucose, capillary     Status: Abnormal   Collection Time: 11/08/20  4:43 PM  Result Value Ref Range   Glucose-Capillary 147 (H) 70 - 99 mg/dL    Comment: Glucose reference range applies only to samples taken after fasting for at least 8 hours.  CBC     Status: Abnormal   Collection Time: 11/09/20  5:19 AM  Result Value Ref Range   WBC 7.8 4.0 - 10.5 K/uL   RBC 2.76 (L) 4.22 - 5.81 MIL/uL   Hemoglobin 8.6 (L) 13.0 - 17.0 g/dL   HCT  26.0 (L) 39.0 - 52.0 %   MCV 94.2 80.0 - 100.0 fL   MCH 31.2 26.0 - 34.0 pg   MCHC 33.1 30.0 - 36.0 g/dL   RDW 14.0 11.5 - 15.5 %   Platelets 132 (L) 150 - 400 K/uL   nRBC 0.0 0.0 - 0.2 %    Comment: Performed at [facility], [address] [place], [place] [address]  Comprehensive metabolic panel     Status: Abnormal   Collection Time: 11/09/20  5:19 AM  Result Value Ref Range   Sodium 144 135 - 145 mmol/L   Potassium 4.4 3.5 - 5.1 mmol/L   Chloride 112 (H) 98 - 111 mmol/L   CO2 23 22 - 32 mmol/L   Glucose, Bld 126 (H) 70 - 99 mg/dL    Comment: Glucose reference range applies only to samples taken after fasting for at least 8 hours.   BUN 56 (H) 8 - 23 mg/dL   Creatinine, Ser 3.98 (H) 0.61 - 1.24 mg/dL   Calcium 7.8 (L) 8.9 - 10.3 mg/dL   Total Protein 6.1 (L) 6.5 - 8.1 g/dL   Albumin 3.3 (L) 3.5 - 5.0 g/dL   AST 12 (L) 15 - 41 U/L   ALT 14 0 - 44 U/L   Alkaline Phosphatase 112 38 - 126 U/L   Total Bilirubin 0.5 0.3 - 1.2 mg/dL   GFR, Estimated 15 (L) >60 mL/min    Comment: (NOTE) Calculated using the CKD-EPI Creatinine Equation (2021)    Anion gap 9 5 - 15    Comment: Performed at [facility], [address], [place], [place] [address]  Magnesium     Status: None   Collection Time: 11/09/20  5:19 AM  Result Value Ref Range   Magnesium 2.0 1.7 - 2.4 mg/dL    Comment: Performed at [facility], [address] [place],  [place] [address]  Glucose, capillary     Status: Abnormal   Collection Time: 11/09/20  8:10 AM  Result Value Ref Range   Glucose-Capillary 103 (H) 70 - 99 mg/dL    Comment: Glucose reference range applies only to samples taken after fasting for at least 8 hours.  Glucose, capillary     Status: None   Collection Time: 11/09/20  2:34 PM  Result Value Ref Range   Glucose-Capillary 86 70 - 99 mg/dL    Comment: Glucose reference range applies only to samples taken after fasting for at least 8 hours.  Iron and TIBC     Status: Abnormal   Collection Time: 11/23/20 11:27 AM  Result Value Ref Range   Iron 34 (L) 45 - 182 ug/dL   TIBC 304 250 - 450 ug/dL   Saturation Ratios 11 (L) 17.9 - 39.5 %   UIBC 270 ug/dL    Comment: Performed at [facility], [address] [place], [place] [address]  Ferritin     Status: None   Collection Time: 11/23/20 11:27 AM  Result Value Ref Range   Ferritin 76 24 - 336 ng/mL    Comment: Performed at [facility], [address] [place], [place] [address]  CBC with Differential     Status: Abnormal   Collection Time: 11/23/20 11:27 AM  Result Value Ref Range   WBC 7.4 4.0 - 10.5 K/uL   RBC 2.78 (L) 4.22 - 5.81 MIL/uL   Hemoglobin 8.6 (L) 13.0 - 17.0 g/dL   HCT 25.2 (L) 39.0 - 52.0 %  MCV 90.6 80.0 - 100.0 fL   MCH 30.9 26.0 - 34.0 pg   MCHC 34.1 30.0 - 36.0 g/dL   RDW 14.0 11.5 - 15.5 %   Platelets 206 150 - 400 K/uL   nRBC 0.0 0.0 - 0.2 %   Neutrophils Relative % 77 %   Neutro Abs 5.7 1.7 - 7.7 K/uL   Lymphocytes Relative 15 %   Lymphs Abs 1.1 0.7 - 4.0 K/uL   Monocytes Relative 5 %   Monocytes Absolute 0.4 0.1 - 1.0 K/uL   Eosinophils Relative 2 %   Eosinophils Absolute 0.2 0.0 - 0.5 K/uL   Basophils Relative 0 %   Basophils Absolute 0.0 0.0 - 0.1 K/uL   Immature Granulocytes 1 %   Abs Immature Granulocytes 0.04 0.00 - 0.07 K/uL    Comment: Performed at [facility], [address], [place], [place] [address]     Radiology No results found.  Assessment/Plan Essential hypertension blood pressure control important in reducing the progression of atherosclerotic disease. On appropriate oral medications.   Diabetes ([place]) blood glucose control important in reducing the progression of atherosclerotic disease. Also, involved in wound healing. On appropriate medications.  ESRD on dialysis [facility]) The patient has now been on dialysis for about 3 to 4 months.  Although this has resulted in marked improvement in his leg swelling, it is rough on him.  He is still getting hemodialysis through his catheter.  We discussed options for dialysis today.  He is planning on proceeding with peritoneal dialysis in the near future.  I discussed that is certainly a good option hope it works well for him.  If this does not work for him, and he needs extremity access for hemodialysis, we will be happy to do a vein mapping and assess him for that.  Lymphedema Symptoms are under relatively good control now.  Starting dialysis and removing fluid has certainly been a significant improvement in his leg swelling.  He is also wearing his compression stockings and elevating his legs.  I will plan to see him back in 6 months.    Leotis Pain, MD  01/22/2021 2:49 PM    This note was created with Dragon medical transcription system.  Any errors from dictation are purely unintentional

## 2021-01-22 NOTE — Patient Instructions (Signed)
Peritoneal Dialysis Information Peritoneal dialysis is a procedure that filters your blood. You may have this procedure if your kidneys are not working well. You can perform peritoneal dialysis yourself, or a machine can do it for you at night when you sleep.  Tell a health care provider about:  Any allergies you have.  All medicines you are taking, including vitamins, herbs, eye drops, creams, and over-the-counter medicines.  Any problems you or family members have had with anesthetic medicines.  Any blood disorders you have.  Any surgeries you have.  Any medical conditions you have.  Whether you are pregnant or may be pregnant. What are the risks? Generally, this is a safe procedure. However, problems may occur, including:  Infection in the lining of your abdomen (peritoneum). This is the most common problem.  Infection in the area where the catheter was inserted.  Discomfort in the area where the catheter was inserted.  Weakened abdominal muscles. This may lead to a hernia, which can cause problems if left untreated. What happens before the procedure? It is important to safely prepare for treatment and take steps to prevent infection. Your health care provider will teach you how to prepare for a dialysis session. Preparation may involve:  Putting on a mask.  Closing doors and windows in the room where dialysis will be performed.  Making sure to wash your hands before and during treatment. Anyone who touches you or the equipment should also wash his or her hands often.  Making sure that tubing and equipment are germ-free (sterile).  Checking the bag of fluid (dialysate) you will use during the session, to make sure it is sealed and free of germs (uncontaminated). What happens during the procedure? At the start of a session, your abdomen is filled with dialysate. The dialysate pulls waste, salt, and extra water through the peritoneum. At the end of the session, the dialysate,  plus all the waste it pulled from your blood, is drained from your body. There are two kinds of peritoneal dialysis. You may be treated using:  Continuous cycling peritoneal dialysis (CCPD). In this type, a machine called a cycler performs an exchange for you by filling and draining your abdomen while you sleep.  Continuous ambulatory peritoneal dialysis (CAPD). In this type, you perform exchanges for yourself up to 5 times a day. Each exchange takes about 30-40 minutes. The amount of time the dialysate stays in your body (the dwell) usually varies from 1.5-3 hours. You may go about your day normally between exchanges. Some people may need to have both CAPD and CCPD.   What can I expect after procedure?  You may need to have lab work or other tests done to check on how well the dialysis is working.  Change the bandage (dressing) around your permanent catheter as directed by your health care provider. Keep the dressing clean and dry.  Weigh yourself after the treatment and write down your weight. Follow these instructions at home: Eating and drinking Follow your health care provider's instructions about diet. You should follow a diet plan that includes:  Nutritional counseling with a dietitian.  Vitamin supplements.  High-quality proteins, such as meat, poultry, fish, and eggs. Most people on peritoneal dialysis need to eat a high-protein diet because protein is lost during the dialysis exchange. Preventing constipation Avoid becoming constipated. Constipation prevents dialysate from draining well. To prevent constipation:  Eat foods that are high in fiber, such as beans, whole grains, and fresh fruits and vegetables.  Limit  foods that are high in fat and processed sugars, such as fried or sweet foods.  Be active.  Go to the restroom when you feel that you need to. Do not hold it in.  Take over-the-counter or prescription medicines, such as laxatives, only if your health care  provider recommends them. General instructions  Keep a strict schedule. Dialysis must be done every day. Do not skip a day or an exchange. Make time for each exchange.  Always keep the dialysate bags and other supplies in a cool, clean, and dry place.  Take over-the-counter and prescription medicines only as told by your health care provider.  Weigh yourself every day. Sudden weight gain may be a sign of a problem.  Keep all follow-up visits. This is important. Where to find more information You can find more information about peritoneal dialysis treatment from:  [facility] of Diabetes and Digestive and Kidney Diseases: [employer]  National Kidney Foundation: www.kidney.org Contact a health care provider if:  You have a fever or chills.  You feel nauseous or you vomit.  You have diarrhea.  You have any problems with an exchange.  Your blood pressure increases.  You suddenly gain weight or feel short of breath.  The catheter seems loose or feels like it is coming out.  The fluid that has drained from your abdomen is pinkish or reddish. Women having their menstrual period do not need to seek medical care if the fluid is only a little pink or a little red.  There are white strands in the dialysate that are large enough to get stuck in your tubing or catheter. Get help right away if:  The area around the catheter swells or becomes red, tender, or painful.  There is pus coming from the catheter area.  The fluid that has drained from your abdomen is cloudy.  You feel more abdominal pain or discomfort. Summary  Peritoneal dialysis is a procedure that filters your blood. You may have this procedure if your kidneys are not working well.  CAPD and CCPD are the two kinds of peritoneal dialysis. Your health care provider will help you decide which kind is best for you.  The main risks of peritoneal dialysis are infection and weakened abdominal muscles, which may  lead to a hernia. This information is not intended to replace advice given to you by your health care provider. Make sure you discuss any questions you have with your health care provider. Document Revised: 07/19/2020 Document Reviewed: 07/19/2020 Elsevier Patient Education  2021 [employer].

## 2021-02-09 NOTE — Progress Notes (Signed)
[place]  [phone] [phone]) [phone]  ID: [name] OB: 1946-01-02  MR#: [id number]  OEV#:[id number]  Patient Care Team: Derinda Late, MD as PCP - General (Family Medicine)   CHIEF COMPLAINT: Iron deficiency anemia.  INTERVAL HISTORY: Patient returns to clinic today for repeat laboratory work and further evaluation.  He continues to have chronic weakness and fatigue, but partially blames this on recently initiating dialysis.  He continues to have chronic back pain as well.  He has no neurologic complaints.  He denies any recent fevers or illnesses.  He has a good appetite and denies weight loss.  He denies any chest pain, shortness of breath, cough, or hemoptysis.  He denies any abdominal pain.  He has no nausea, vomiting, constipation, or diarrhea.  He denies any melena or hematochezia.  He has no urinary complaints.  Patient offers no further specific complaints today.  REVIEW OF SYSTEMS:   Review of Systems  Constitutional: Positive for malaise/fatigue. Negative for fever and weight loss.  Respiratory: Negative.  Negative for cough, hemoptysis and shortness of breath.   Cardiovascular: Negative.  Negative for chest pain and leg swelling.  Gastrointestinal: Negative.  Negative for abdominal pain and constipation.  Genitourinary: Negative.  Negative for flank pain and hematuria.  Musculoskeletal: Positive for back pain.  Skin: Negative.  Negative for rash.  Neurological: Positive for weakness. Negative for dizziness, sensory change, focal weakness and headaches.  Psychiatric/Behavioral: Negative.  The patient is not nervous/anxious.     As per HPI. Otherwise, a complete review of systems is negative.  PAST MEDICAL HISTORY: Past Medical History:  Diagnosis Date  . Chronic back pain   . COPD (chronic obstructive pulmonary disease) ([place])   . Depression   . Diabetes mellitus without complication ([place])   . Hyperlipidemia   .  Hypertension   . Renal cell carcinoma ([place])   . Sleep apnea     PAST SURGICAL HISTORY: Past Surgical History:  Procedure Laterality Date  . CARPAL TUNNEL RELEASE    . CHOLECYSTECTOMY    . COLONOSCOPY WITH PROPOFOL N/A 07/29/2017   Procedure: COLONOSCOPY WITH PROPOFOL;  Surgeon: Manya Silvas, MD;  Location: [facility] ENDOSCOPY;  Service: Endoscopy;  Laterality: N/A;  . colonoscopys    . DIALYSIS/PERMA CATHETER INSERTION N/A 10/09/2020   Procedure: DIALYSIS/PERMA CATHETER INSERTION;  Surgeon: Katha Cabal, MD;  Location: [place] CV LAB;  Service: Cardiovascular;  Laterality: N/A;  . ESOPHAGOGASTRODUODENOSCOPY (EGD) WITH PROPOFOL N/A 07/29/2017   Procedure: ESOPHAGOGASTRODUODENOSCOPY (EGD) WITH PROPOFOL;  Surgeon: Manya Silvas, MD;  Location: [facility] ENDOSCOPY;  Service: Endoscopy;  Laterality: N/A;  . FLEXIBLE SIGMOIDOSCOPY    . GASTRIC BYPASS    . NEPHRECTOMY    . open reduction and internal fixation, right distal radius    . right knee arthroscopy    . TEMPORARY DIALYSIS CATHETER N/A 10/02/2020   Procedure: TEMPORARY DIALYSIS CATHETER;  Surgeon: Katha Cabal, MD;  Location: [place] CV LAB;  Service: Cardiovascular;  Laterality: N/A;  . UPPER GI ENDOSCOPY      FAMILY HISTORY: Family History  Problem Relation Age of Onset  . Diabetes Mother   . Cancer Mother   . Heart disease Father     ADVANCED DIRECTIVES (Y/N):  N  HEALTH MAINTENANCE: Social History   Tobacco Use  . Smoking status: Former Smoker    Packs/day: 1.00    Years: 30.00    Pack years: 30.00  . Smokeless tobacco: Current User  Types: Chew  Vaping Use  . Vaping Use: Never used  Substance Use Topics  . Alcohol use: No  . Drug use: No     Colonoscopy:  PAP:  Bone density:  Lipid panel:  Allergies  Allergen Reactions  . Bupropion     Other reaction(s): Other (See Comments) Mood swings, angry Other reaction(s): Other (See Comments) Mood swings, angry    Current  Outpatient Medications  Medication Sig Dispense Refill  . albuterol (VENTOLIN HFA) 108 (90 Base) MCG/ACT inhaler Inhale 2 puffs into the lungs every 6 (six) hours as needed for wheezing.     Marland Kitchen apixaban (ELIQUIS) 2.5 MG TABS tablet Take 1 tablet (2.5 mg total) by mouth 2 (two) times daily. 60 tablet 0  . carvedilol (COREG) 6.25 MG tablet Take 6.25 mg by mouth 2 (two) times daily with a meal.    . clonazePAM (KLONOPIN) 0.5 MG tablet Take 0.5 mg by mouth 2 (two) times daily as needed.    . DULoxetine (CYMBALTA) 60 MG capsule Take 120 mg by mouth daily.     Marland Kitchen ezetimibe (ZETIA) 10 MG tablet Take by mouth.    Marland Kitchen glipiZIDE (GLUCOTROL XL) 10 MG 24 hr tablet Take 10 mg by mouth daily.    . lipase/protease/amylase (CREON) 36000 UNITS CPEP capsule Take 2 capsules by mouth 3 (three) times daily with meals.    . mirtazapine (REMERON) 45 MG tablet Take 45 mg by mouth at bedtime.     . multivitamin (RENA-VIT) TABS tablet Take 1 tablet by mouth at bedtime. 30 tablet 0  . Oxycodone HCl 10 MG TABS Take 1 tablet (10 mg total) by mouth 3 (three) times daily as needed. Must last 30 days. (Patient taking differently: Take 5 mg by mouth 3 (three) times daily as needed. Must last 30 days.) 90 tablet 0  . [START ON 03/04/2021] Oxycodone HCl 10 MG TABS Take 1 tablet (10 mg total) by mouth 3 (three) times daily as needed. Must last 30 days. 90 tablet 0  . pantoprazole (PROTONIX) 40 MG tablet Take 40 mg by mouth 2 (two) times daily.     Marland Kitchen rOPINIRole (REQUIP) 4 MG tablet Take 1 tablet (4 mg total) by mouth 2 (two) times daily before a meal. 60 tablet 0  . rosuvastatin (CRESTOR) 10 MG tablet Take 1 tablet (10 mg total) by mouth daily. 30 tablet 2  . sucralfate (CARAFATE) 1 g tablet Take 1 g by mouth 3 (three) times daily.     Marland Kitchen thiamine (VITAMIN B-1) 100 MG tablet Take 100 mg by mouth daily.     No current facility-administered medications for this visit.    OBJECTIVE: Vitals:   02/14/21 1410  BP: 136/78  Pulse: 98   Resp: 18  Temp: 98.5 F (36.9 C)  SpO2: 98%     Body mass index is 34.62 kg/m.    ECOG FS:0 - Asymptomatic  General: Well-developed, well-nourished, no acute distress. Eyes: Pink conjunctiva, anicteric sclera. HEENT: Normocephalic, moist mucous membranes. Lungs: No audible wheezing or coughing. Heart: Regular rate and rhythm. Abdomen: Soft, nontender, no obvious distention. Musculoskeletal: No edema, cyanosis, or clubbing. Neuro: Alert, answering all questions appropriately. Cranial nerves grossly intact. Skin: No rashes or petechiae noted. Psych: Normal affect.   LAB RESULTS:  Lab Results  Component Value Date   NA 144 11/09/2020   K 4.4 11/09/2020   CL 112 (H) 11/09/2020   CO2 23 11/09/2020   GLUCOSE 126 (H) 11/09/2020   BUN 56 (H)  11/09/2020   CREATININE 3.98 (H) 11/09/2020   CALCIUM 7.8 (L) 11/09/2020   PROT 6.1 (L) 11/09/2020   ALBUMIN 3.3 (L) 11/09/2020   AST 12 (L) 11/09/2020   ALT 14 11/09/2020   ALKPHOS 112 11/09/2020   BILITOT 0.5 11/09/2020   GFRNONAA 15 (L) 11/09/2020   GFRAA 47 (L) 03/26/2020    Lab Results  Component Value Date   WBC 10.9 (H) 02/12/2021   NEUTROABS 9.2 (H) 02/12/2021   HGB 10.4 (L) 02/12/2021   HCT 33.3 (L) 02/12/2021   MCV 100.6 (H) 02/12/2021   PLT 173 02/12/2021   Lab Results  Component Value Date   IRON 33 (L) 02/12/2021   TIBC 246 (L) 02/12/2021   IRONPCTSAT 13 (L) 02/12/2021   Lab Results  Component Value Date   FERRITIN 216 02/12/2021     STUDIES: No results found.  ASSESSMENT: Iron deficiency anemia.  PLAN:    1.  Iron deficiency anemia: Patient's hemoglobin and iron stores are reduced, but he states he now receives IV iron during his dialysis treatments.  He also receives Retacrit.  Patient does not wish to pursue treatment today.  After lengthy discussion, it was determined that no further follow-up is necessary since he is receiving his anemia treatments during dialysis.  Please refer patient back if  there are any questions or concerns.  2.  Chronic renal insufficiency: Patient is now on dialysis.  Continue follow-up with nephrology as scheduled.  Retacrit as above. 3.  Vascular insufficiency: Chronic and unchanged.  Continue follow-up with vascular surgery as scheduled. 4.  Back pain: Chronic and unchanged.  Continue follow-up and treatment per physical therapy.   Patient expressed understanding and was in agreement with this plan. He also understands that He can call clinic at any time with any questions, concerns, or complaints.    Lloyd Huger, MD   02/16/2021 7:27 AM

## 2021-02-12 ENCOUNTER — Inpatient Hospital Stay: Payer: Medicare Other | Attending: Oncology

## 2021-02-12 DIAGNOSIS — Z992 Dependence on renal dialysis: Secondary | ICD-10-CM | POA: Diagnosis not present

## 2021-02-12 DIAGNOSIS — I998 Other disorder of circulatory system: Secondary | ICD-10-CM | POA: Insufficient documentation

## 2021-02-12 DIAGNOSIS — Z87891 Personal history of nicotine dependence: Secondary | ICD-10-CM | POA: Insufficient documentation

## 2021-02-12 DIAGNOSIS — N189 Chronic kidney disease, unspecified: Secondary | ICD-10-CM | POA: Diagnosis not present

## 2021-02-12 DIAGNOSIS — D509 Iron deficiency anemia, unspecified: Secondary | ICD-10-CM | POA: Diagnosis not present

## 2021-02-12 DIAGNOSIS — M549 Dorsalgia, unspecified: Secondary | ICD-10-CM | POA: Insufficient documentation

## 2021-02-12 DIAGNOSIS — G8929 Other chronic pain: Secondary | ICD-10-CM | POA: Insufficient documentation

## 2021-02-12 LAB — IRON AND TIBC
Iron: 33 ug/dL — ABNORMAL LOW (ref 45–182)
Saturation Ratios: 13 % — ABNORMAL LOW (ref 17.9–39.5)
TIBC: 246 ug/dL — ABNORMAL LOW (ref 250–450)
UIBC: 213 ug/dL

## 2021-02-12 LAB — CBC WITH DIFFERENTIAL/PLATELET
Abs Immature Granulocytes: 0.03 10*3/uL (ref 0.00–0.07)
Basophils Absolute: 0 10*3/uL (ref 0.0–0.1)
Basophils Relative: 0 %
Eosinophils Absolute: 0.1 10*3/uL (ref 0.0–0.5)
Eosinophils Relative: 1 %
HCT: 33.3 % — ABNORMAL LOW (ref 39.0–52.0)
Hemoglobin: 10.4 g/dL — ABNORMAL LOW (ref 13.0–17.0)
Immature Granulocytes: 0 %
Lymphocytes Relative: 9 %
Lymphs Abs: 1 10*3/uL (ref 0.7–4.0)
MCH: 31.4 pg (ref 26.0–34.0)
MCHC: 31.2 g/dL (ref 30.0–36.0)
MCV: 100.6 fL — ABNORMAL HIGH (ref 80.0–100.0)
Monocytes Absolute: 0.6 10*3/uL (ref 0.1–1.0)
Monocytes Relative: 5 %
Neutro Abs: 9.2 10*3/uL — ABNORMAL HIGH (ref 1.7–7.7)
Neutrophils Relative %: 85 %
Platelets: 173 10*3/uL (ref 150–400)
RBC: 3.31 MIL/uL — ABNORMAL LOW (ref 4.22–5.81)
RDW: 15.9 % — ABNORMAL HIGH (ref 11.5–15.5)
WBC: 10.9 10*3/uL — ABNORMAL HIGH (ref 4.0–10.5)
nRBC: 0 % (ref 0.0–0.2)

## 2021-02-12 LAB — FERRITIN: Ferritin: 216 ng/mL (ref 24–336)

## 2021-02-14 ENCOUNTER — Inpatient Hospital Stay: Payer: Medicare Other

## 2021-02-14 ENCOUNTER — Encounter: Payer: Self-pay | Admitting: Oncology

## 2021-02-14 ENCOUNTER — Inpatient Hospital Stay (HOSPITAL_BASED_OUTPATIENT_CLINIC_OR_DEPARTMENT_OTHER): Payer: Medicare Other | Admitting: Oncology

## 2021-02-14 ENCOUNTER — Other Ambulatory Visit: Payer: Self-pay

## 2021-02-14 VITALS — BP 136/78 | HR 98 | Temp 98.5°F | Resp 18 | Wt 255.3 lb

## 2021-02-14 DIAGNOSIS — D509 Iron deficiency anemia, unspecified: Secondary | ICD-10-CM | POA: Diagnosis not present

## 2021-02-14 NOTE — Progress Notes (Signed)
Patient here for oncology follow-up appointment, expresses no complaints or concerns at this time.    

## 2021-02-19 ENCOUNTER — Telehealth (INDEPENDENT_AMBULATORY_CARE_PROVIDER_SITE_OTHER): Payer: Self-pay

## 2021-02-19 ENCOUNTER — Other Ambulatory Visit (INDEPENDENT_AMBULATORY_CARE_PROVIDER_SITE_OTHER): Payer: Self-pay | Admitting: Nurse Practitioner

## 2021-02-19 NOTE — Telephone Encounter (Addendum)
A fax was received from the patient's dialysis center stating the patient was having trouble with his access. Per Dr. Lucky Cowboy scheduling the patient for a TPA infusion is warranted. Patient is scheduled with Dr. Lucky Cowboy for 02/20/21 with a 10:30 am arrival time to the [facility] and covid testing on 02/19/21 before 11:00 am at the [facility]. I contacted Amber at the dialysis center yesterday and she was to let the patient know. I called the patient this morning and the patient states he didn't know he was having any problems with his access and wanted to talk to them first. Patient stated he would call back. Patient called back and was given the pre-procedure instructions as well as the information to do his covid test today before 11:00 am.

## 2021-02-20 ENCOUNTER — Other Ambulatory Visit: Admission: RE | Admit: 2021-02-20 | Payer: Medicare Other | Source: Ambulatory Visit

## 2021-02-20 ENCOUNTER — Ambulatory Visit
Admission: RE | Admit: 2021-02-20 | Discharge: 2021-02-20 | Disposition: A | Discharge status: Home / Self Care | Payer: Medicare Other | Source: Ambulatory Visit | Attending: Vascular Surgery | Admitting: Vascular Surgery

## 2021-02-20 ENCOUNTER — Other Ambulatory Visit: Payer: Self-pay

## 2021-02-20 DIAGNOSIS — N186 End stage renal disease: Secondary | ICD-10-CM | POA: Diagnosis present

## 2021-02-20 LAB — GLUCOSE, CAPILLARY
Glucose-Capillary: 56 mg/dL — ABNORMAL LOW (ref 70–99)
Glucose-Capillary: 82 mg/dL (ref 70–99)

## 2021-02-20 MED ORDER — SODIUM CHLORIDE 0.9 % IV SOLN
5.0000 mg | Freq: Once | INTRAVENOUS | Status: AC
  Administered 2021-02-20: 5 mg via INTRAVENOUS
  Filled 2021-02-20: qty 5

## 2021-02-20 MED ORDER — HEPARIN SODIUM (PORCINE) 5000 UNIT/ML IJ SOLN
INTRAMUSCULAR | Status: AC
  Filled 2021-02-20: qty 1

## 2021-02-20 MED ORDER — HEPARIN SODIUM (PORCINE) 1000 UNIT/ML IJ SOLN
1.6000 mL | Freq: Once | INTRAMUSCULAR | Status: AC
  Administered 2021-02-20: 1600 [IU] via INTRAVENOUS

## 2021-02-20 NOTE — Progress Notes (Signed)
Patient arrived for tpa infusion of his perm cath notes from [facility] states he was supposed to have pre procedure covid test on 3/8.  Patient states he had covid in January and I checked in care everywhere and patient had positive covid test on 12/28/20 and is now asymptomatic

## 2021-02-21 ENCOUNTER — Emergency Department: Payer: Medicare Other

## 2021-02-21 ENCOUNTER — Encounter: Payer: Self-pay | Admitting: Emergency Medicine

## 2021-02-21 ENCOUNTER — Other Ambulatory Visit: Payer: Self-pay

## 2021-02-21 ENCOUNTER — Telehealth (INDEPENDENT_AMBULATORY_CARE_PROVIDER_SITE_OTHER): Payer: Self-pay

## 2021-02-21 ENCOUNTER — Emergency Department
Admission: EM | Admit: 2021-02-21 | Discharge: 2021-02-21 | Disposition: A | Discharge status: Home / Self Care | Payer: Medicare Other | Attending: Emergency Medicine | Admitting: Emergency Medicine

## 2021-02-21 DIAGNOSIS — J449 Chronic obstructive pulmonary disease, unspecified: Secondary | ICD-10-CM | POA: Insufficient documentation

## 2021-02-21 DIAGNOSIS — Z85528 Personal history of other malignant neoplasm of kidney: Secondary | ICD-10-CM | POA: Insufficient documentation

## 2021-02-21 DIAGNOSIS — R0789 Other chest pain: Secondary | ICD-10-CM | POA: Diagnosis not present

## 2021-02-21 DIAGNOSIS — I120 Hypertensive chronic kidney disease with stage 5 chronic kidney disease or end stage renal disease: Secondary | ICD-10-CM | POA: Insufficient documentation

## 2021-02-21 DIAGNOSIS — Z8673 Personal history of transient ischemic attack (TIA), and cerebral infarction without residual deficits: Secondary | ICD-10-CM | POA: Insufficient documentation

## 2021-02-21 DIAGNOSIS — Z992 Dependence on renal dialysis: Secondary | ICD-10-CM | POA: Diagnosis not present

## 2021-02-21 DIAGNOSIS — Z87891 Personal history of nicotine dependence: Secondary | ICD-10-CM | POA: Diagnosis not present

## 2021-02-21 DIAGNOSIS — R079 Chest pain, unspecified: Secondary | ICD-10-CM

## 2021-02-21 DIAGNOSIS — Z7984 Long term (current) use of oral hypoglycemic drugs: Secondary | ICD-10-CM | POA: Diagnosis not present

## 2021-02-21 DIAGNOSIS — N186 End stage renal disease: Secondary | ICD-10-CM | POA: Insufficient documentation

## 2021-02-21 DIAGNOSIS — Z79899 Other long term (current) drug therapy: Secondary | ICD-10-CM | POA: Insufficient documentation

## 2021-02-21 DIAGNOSIS — E1122 Type 2 diabetes mellitus with diabetic chronic kidney disease: Secondary | ICD-10-CM | POA: Insufficient documentation

## 2021-02-21 DIAGNOSIS — Z7901 Long term (current) use of anticoagulants: Secondary | ICD-10-CM | POA: Diagnosis not present

## 2021-02-21 DIAGNOSIS — R0600 Dyspnea, unspecified: Secondary | ICD-10-CM | POA: Diagnosis not present

## 2021-02-21 LAB — CBC
HCT: 31.1 % — ABNORMAL LOW (ref 39.0–52.0)
Hemoglobin: 9.4 g/dL — ABNORMAL LOW (ref 13.0–17.0)
MCH: 30.2 pg (ref 26.0–34.0)
MCHC: 30.2 g/dL (ref 30.0–36.0)
MCV: 100 fL (ref 80.0–100.0)
Platelets: 185 10*3/uL (ref 150–400)
RBC: 3.11 MIL/uL — ABNORMAL LOW (ref 4.22–5.81)
RDW: 15.9 % — ABNORMAL HIGH (ref 11.5–15.5)
WBC: 7.4 10*3/uL (ref 4.0–10.5)
nRBC: 0 % (ref 0.0–0.2)

## 2021-02-21 LAB — BASIC METABOLIC PANEL
Anion gap: 12 (ref 5–15)
BUN: 55 mg/dL — ABNORMAL HIGH (ref 8–23)
CO2: 20 mmol/L — ABNORMAL LOW (ref 22–32)
Calcium: 7.5 mg/dL — ABNORMAL LOW (ref 8.9–10.3)
Chloride: 107 mmol/L (ref 98–111)
Creatinine, Ser: 4.87 mg/dL — ABNORMAL HIGH (ref 0.61–1.24)
GFR, Estimated: 12 mL/min — ABNORMAL LOW (ref >60)
Glucose, Bld: 138 mg/dL — ABNORMAL HIGH (ref 70–99)
Potassium: 4.8 mmol/L (ref 3.5–5.1)
Sodium: 139 mmol/L (ref 135–145)

## 2021-02-21 LAB — TROPONIN I (HIGH SENSITIVITY)
Troponin I (High Sensitivity): 6 ng/L (ref <18)
Troponin I (High Sensitivity): 6 ng/L (ref <18)

## 2021-02-21 LAB — D-DIMER, QUANTITATIVE: D-Dimer, Quant: 0.85 ug/mL-FEU — ABNORMAL HIGH (ref 0.00–0.50)

## 2021-02-21 MED ORDER — SODIUM CHLORIDE 0.9 % IV BOLUS
500.0000 mL | Freq: Once | INTRAVENOUS | Status: AC
  Administered 2021-02-21: 500 mL via INTRAVENOUS

## 2021-02-21 MED ORDER — IOHEXOL 350 MG/ML SOLN
75.0000 mL | Freq: Once | INTRAVENOUS | Status: AC | PRN
  Administered 2021-02-21: 75 mL via INTRAVENOUS

## 2021-02-21 NOTE — Discharge Instructions (Signed)
Please follow-up with your hemodialysis session tomorrow.  Additionally recommend follow-up with your primary care physician in the next few days.  Return to the Emergency Department (ED) if you experience any further chest pain/pressure/tightness, difficulty breathing, or sudden sweating, or other symptoms that concern you.

## 2021-02-21 NOTE — ED Notes (Signed)
Discharge instructions reviewed with pt. Pt calm , collective, denied pain or sob  

## 2021-02-21 NOTE — Telephone Encounter (Signed)
Pt called and the front desk took a message where the pt said that he was having SOB and chest pains. The front desk forwarded the call to me and I also saw that the pt has just had a TPA infusion yesterday per their chart. I made the pt aware that with these symptoms they should be seen in the ED. The pt said he wanted to know were these normal symptoms after his procedure yesterday as they have subsided  I made him aware I could ask my NP. Please advise.

## 2021-02-21 NOTE — ED Triage Notes (Signed)
Pt to ED via POV with c/o R upper chest pain, states started last night, pt states had dialysis catheter flushed to regain patency yesterday and then had sudden onset CP last night. Pt states pain has started to subside, states no longer throbbing, states is now just a "soreness".

## 2021-02-21 NOTE — ED Notes (Signed)
Patient transported to CT 

## 2021-02-21 NOTE — ED Provider Notes (Signed)
Vitals:   02/21/21 1630 02/21/21 1700  BP: (!) 93/51 (!) 110/59  Pulse: 81 83  Resp: 15 17  Temp:    SpO2: 94% 97%    Patient resting comfortably.  Requesting to eat a meal which we will provide.  DG Chest 2 View  Result Date: 02/21/2021 CLINICAL DATA:  Chest pain EXAM: CHEST - 2 VIEW COMPARISON:  October 07, 2020 FINDINGS: Central catheter tip in superior vena cava near the cavoatrial junction. No pneumothorax. There is left base atelectasis. Lungs elsewhere are clear. Heart is slightly enlarged with pulmonary vascularity normal. No adenopathy. No bone lesions. IMPRESSION: Central catheter tip in superior vena cava near the cavoatrial junction. No pneumothorax. Left base atelectasis. No edema or airspace opacity. Mild cardiac prominence. Electronically Signed   By: Lowella Grip III M.D.   On: 02/21/2021 13:49   CT Angio Chest PE W and/or Wo Contrast  Result Date: 02/21/2021 CLINICAL DATA:  Right upper chest pain post dialysis catheter flushing last night. EXAM: CT ANGIOGRAPHY CHEST WITH CONTRAST TECHNIQUE: Multidetector CT imaging of the chest was performed using the standard protocol during bolus administration of intravenous contrast. Multiplanar CT image reconstructions and MIPs were obtained to evaluate the vascular anatomy. CONTRAST:  23mL OMNIPAQUE IOHEXOL 350 MG/ML SOLN COMPARISON:  Chest radiograph from earlier today. 01/28/2014 chest CT. FINDINGS: Cardiovascular: The study is high quality for the evaluation of pulmonary embolism. There are no filling defects in the central, lobar, segmental or subsegmental pulmonary artery branches to suggest acute pulmonary embolism. Normal course and caliber of the thoracic aorta. Dilated main pulmonary artery (3.8 cm diameter). Normal heart size. Small pericardial effusion. Three-vessel coronary atherosclerosis. Right internal jugular central venous catheter terminates in the lower third of the SVC. Mediastinum/Nodes: No discrete thyroid nodules.  Unremarkable esophagus. No pathologically enlarged axillary, mediastinal or hilar lymph nodes. Lungs/Pleura: No pneumothorax. No pleural effusion. No acute consolidative airspace disease, lung masses or significant pulmonary nodules. Platelike parenchymal bands at the left greater than right lung bases compatible with scarring or atelectasis. 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 Musculoskeletal: No aggressive appearing focal osseous lesions. Mild thoracic spondylosis. Review of the MIP images confirms the above findings. IMPRESSION: 1. No pulmonary embolism. 2. Dilated main pulmonary artery, suggesting pulmonary arterial hypertension. 3. Small pericardial effusion. 4. Three-vessel coronary atherosclerosis. 5. Platelike parenchymal bands at the left greater than right lung bases, compatible with scarring or atelectasis. Electronically Signed   By: Ilona Sorrel M.D.   On: 02/21/2021 15:45    CT imaging reviewed, negative for acute finding such as pulmonary embolism.  Dilated pulmonary arteries are noted.  Small pericardial effusion three-vessel coronary atherosclerosis.  Discussed with Dr. Juleen China at this time who advises will follow closely with dialysis tomorrow.  Also discussed the patient's blood pressure sitting approximately 97D/53G systolic.  Blood pressure currently 992 systolic.  Patient asymptomatic.  Reports he feels much better the majority of his pain was last night.  Resting comfortably, agreeable with close follow-up and have discussed with Dr. Juleen China who advises patient is dialysis tomorrow, which patient is aware, and we will certainly reevaluate how he is doing and check his blood pressure there as well.  I also looked at the patient's EKG, appears similar to previous.  His troponins are normal.  He does have a pericardial effusion, but with his significant improvement in pain in the small effusion, I do not so I think that this would  be the etiology of his chest  pain.  Patient ambulatory no distress.  Blood pressure improved.  No distress comfortable plan for discharge  Return precautions and treatment recommendations and follow-up discussed with the patient who is agreeable with the plan.    Delman Kitten, MD 02/21/21 1725

## 2021-02-21 NOTE — Telephone Encounter (Signed)
The patient should present to the ED.  Unfortunately we can't be sure that the infusion is the cause of his chest pain and SOB.  Anytime a patient has that they need to have an EKG, bloodwork and a chest x-ray and possibly other tests.  These tests need to be done as soon as possible. We do not have the availability to do any of that in the office.   It could also be because he has not had dialysis in several days (to my understanding).  They can get him dialyzed quickly if that is indeed the case.  So the patient should go to the ED as soon as possible.

## 2021-02-21 NOTE — ED Notes (Signed)
Pt given water , Ok per MD

## 2021-02-21 NOTE — ED Provider Notes (Signed)
[facility] Emergency Department Provider Note  Time seen: 1:32 PM  I have reviewed the triage vital signs and the nursing notes.   HISTORY  Chief Complaint Chest Pain   HPI [name] is a 75 y.o. male with a past medical history of COPD, depression, diabetes, hypertension, hyperlipidemia, ESRD on HD Monday/Wednesday/Friday, presents to the emergency department for chest pain shortness of breath.  According to the patient yesterday he had a clogged dialysis catheter that required a procedure to remove the clot.  He states last night he developed moderate central chest pain which he describes as sharp pain/pressure along shortness of breath.  States the pain relieved considerably until this morning it began coming back again.  Continues to have mild shortness of breath per patient.  Patient called his doctor who referred him to the emergency department for further work-up.  Patient denies any history of MI or cardiac disease.  No stents.  Patient denies any recent illnesses fever cough or congestion.  Overall patient appears well currently.  Past Medical History:  Diagnosis Date  . Chronic back pain   . COPD (chronic obstructive pulmonary disease) ([place])   . Depression   . Diabetes mellitus without complication ([place])   . Hyperlipidemia   . Hypertension   . Renal cell carcinoma ([place])   . Sleep apnea     Patient Active Problem List   Diagnosis Date Noted  . ESRD on dialysis ([place]) 01/22/2021  . Acute CVA (cerebrovascular accident) ([place]) 11/07/2020  . Malnutrition of moderate degree 10/11/2020  . History of renal cell carcinoma 09/30/2020  . History of left nephrectomy 2004 09/30/2020  . AKI (acute kidney injury) ([place]) 09/30/2020  . Hypoglycemia 09/30/2020  . Pancreatic insufficiency 09/30/2020  . Chronic diarrhea 09/30/2020  . Metabolic acidosis 67/20/9470  . Hand numbness 04/28/2020  . CAP (community acquired pneumonia) 03/25/2020  . Acute on  chronic respiratory failure with hypoxia ([place]) 03/25/2020  . Hyperlipidemia   . COPD (chronic obstructive pulmonary disease) ([place])   . Depression   . CKD stage 3 due to type 2 diabetes mellitus ([place])   . Cellulitis 01/10/2020  . Proteinuria 09/20/2019  . Hyperkalemia 07/05/2019  . Lumbar degenerative disc disease 05/11/2019  . Spinal stenosis, lumbar region, with neurogenic claudication 05/11/2019  . Ankylosis of lumbar spine 05/11/2019  . Neuroforaminal stenosis of lumbar spine (left, L5/S1) 05/11/2019  . Lumbar facet arthropathy 05/11/2019  . Chronic pain syndrome 05/11/2019  . Chronic obstructive pulmonary disease ([place]) 01/03/2019  . Atherosclerotic peripheral vascular disease ([place]) 09/01/2018  . Lymphedema 06/22/2018  . Iron deficiency anemia 05/01/2018  . Anemia in chronic kidney disease 04/26/2018  . Varicose veins of leg with swelling, bilateral 04/09/2018  . SOB (shortness of breath) on exertion 03/17/2018  . Bilateral lower extremity edema 02/23/2018  . Lower extremity pain, bilateral 02/23/2018  . Essential hypertension 02/23/2018  . H/O deep venous thrombosis 01/23/2017  . Type II diabetes mellitus with renal manifestations ([place]) 06/19/2014  . Chronic kidney disease, unspecified 06/19/2014  . Anxiety state 06/19/2014  . Other and unspecified hyperlipidemia 06/19/2014    Past Surgical History:  Procedure Laterality Date  . CARPAL TUNNEL RELEASE    . CHOLECYSTECTOMY    . COLONOSCOPY WITH PROPOFOL N/A 07/29/2017   Procedure: COLONOSCOPY WITH PROPOFOL;  Surgeon: Manya Silvas, MD;  [facility] ENDOSCOPY;  Service: Endoscopy;  Laterality: N/A;  . colonoscopys    . DIALYSIS/PERMA CATHETER INSERTION N/A 10/09/2020   Procedure: DIALYSIS/PERMA CATHETER INSERTION;  Surgeon: Katha Cabal, MD;  Location: [place] CV LAB;  Service: Cardiovascular;  Laterality: N/A;  . ESOPHAGOGASTRODUODENOSCOPY (EGD) WITH PROPOFOL N/A 07/29/2017   Procedure:  ESOPHAGOGASTRODUODENOSCOPY (EGD) WITH PROPOFOL;  Surgeon: Manya Silvas, MD;  Location: [facility] ENDOSCOPY;  Service: Endoscopy;  Laterality: N/A;  . FLEXIBLE SIGMOIDOSCOPY    . GASTRIC BYPASS    . NEPHRECTOMY    . open reduction and internal fixation, right distal radius    . right knee arthroscopy    . TEMPORARY DIALYSIS CATHETER N/A 10/02/2020   Procedure: TEMPORARY DIALYSIS CATHETER;  Surgeon: Katha Cabal, MD;  Location: [place] CV LAB;  Service: Cardiovascular;  Laterality: N/A;  . UPPER GI ENDOSCOPY      Prior to Admission medications   Medication Sig Start Date End Date Taking? Authorizing Provider  albuterol (VENTOLIN HFA) 108 (90 Base) MCG/ACT inhaler Inhale 2 puffs into the lungs every 6 (six) hours as needed for wheezing.  05/09/20   [provider]  apixaban (ELIQUIS) 2.5 MG TABS tablet Take 1 tablet (2.5 mg total) by mouth 2 (two) times daily. 10/12/20   Swayze, Ava, DO  carvedilol (COREG) 6.25 MG tablet Take 6.25 mg by mouth 2 (two) times daily with a meal.    [provider]  clonazePAM (KLONOPIN) 0.5 MG tablet Take 0.5 mg by mouth 2 (two) times daily as needed. 06/25/20   [provider]  DULoxetine (CYMBALTA) 60 MG capsule Take 120 mg by mouth daily.  09/01/19   [provider]  ezetimibe (ZETIA) 10 MG tablet Take by mouth. 11/22/20 11/22/21  [provider]  glipiZIDE (GLUCOTROL XL) 10 MG 24 hr tablet Take 10 mg by mouth daily. 07/25/20   [provider]  lipase/protease/amylase (CREON) 36000 UNITS CPEP capsule Take 2 capsules by mouth 3 (three) times daily with meals. 09/21/20 09/21/21  [provider]  mirtazapine (REMERON) 45 MG tablet Take 45 mg by mouth at bedtime.  07/05/20   [provider]  multivitamin (RENA-VIT) TABS tablet Take 1 tablet by mouth at bedtime. 10/12/20   Swayze, Ava, DO  Oxycodone HCl 10 MG TABS Take 1 tablet (10 mg total) by mouth 3 (three) times daily as needed. Must last 30  days. Patient taking differently: Take 5 mg by mouth 3 (three) times daily as needed. Must last 30 days. 02/02/21 03/04/21  Gillis Santa, MD  Oxycodone HCl 10 MG TABS Take 1 tablet (10 mg total) by mouth 3 (three) times daily as needed. Must last 30 days. 03/04/21 04/03/21  Gillis Santa, MD  pantoprazole (PROTONIX) 40 MG tablet Take 40 mg by mouth 2 (two) times daily.     [provider]  rOPINIRole (REQUIP) 4 MG tablet Take 1 tablet (4 mg total) by mouth 2 (two) times daily before a meal. 10/12/20   Swayze, Ava, DO  rosuvastatin (CRESTOR) 10 MG tablet Take 1 tablet (10 mg total) by mouth daily. 11/09/20   Pokhrel, Corrie Mckusick, MD  sucralfate (CARAFATE) 1 g tablet Take 1 g by mouth 3 (three) times daily.     [provider]  thiamine (VITAMIN B-1) 100 MG tablet Take 100 mg by mouth daily. Patient not taking: Reported on 02/20/2021    [provider]    Allergies  Allergen Reactions  . Bupropion     Other reaction(s): Other (See Comments) Mood swings, angry Other reaction(s): Other (See Comments) Mood swings, angry    Family History  Problem Relation Age of Onset  . Diabetes  Mother   . Cancer Mother   . Heart disease Father     Social History Social History   Tobacco Use  . Smoking status: Former Smoker    Packs/day: 1.00    Years: 30.00    Pack years: 30.00  . Smokeless tobacco: Current User    Types: Chew  Vaping Use  . Vaping Use: Never used  Substance Use Topics  . Alcohol use: No  . Drug use: No    Review of Systems Constitutional: Negative for fever. Cardiovascular: Positive for chest pain last night, mild chest tightness currently. Respiratory: Shortness of breath since last night.  Mild shortness of breath currently. Gastrointestinal: Negative for abdominal pain Musculoskeletal: Left greater than right lower extremity edema which is chronic per patient and largely unchanged. Skin: Negative for skin complaints  Neurological: Negative for  headache All other ROS negative  ____________________________________________   PHYSICAL EXAM:  VITAL SIGNS: ED Triage Vitals [02/21/21 1249]  Enc Vitals Group     BP      Pulse      Resp      Temp      Temp src      SpO2      Weight 250 lb (113.4 kg)     Height 6' (1.829 m)     Head Circumference      Peak Flow      Pain Score 4     Pain Loc      Pain Edu?      Excl. in [place]?    Constitutional: Alert and oriented. Well appearing and in no distress. Eyes: Normal exam ENT      Head: Normocephalic and atraumatic.      Mouth/Throat: Mucous membranes are moist. Cardiovascular: Normal rate, regular rhythm. Respiratory: Normal respiratory effort without tachypnea nor retractions. Breath sounds are clear.  Right subclavian dialysis catheter present. Gastrointestinal: Soft and nontender. No distention.  Musculoskeletal: Nontender with normal range of motion in all extremities.  Mild lower extremity mid left greater than right, chronic per patient. Neurologic:  Normal speech and language. No gross focal neurologic deficits  Skin:  Skin is warm, dry and intact.  Psychiatric: Mood and affect are normal.   ____________________________________________    EKG  EKG viewed and interpreted by myself shows a normal sinus rhythm at 96 bpm with a narrow QRS, normal axis, largely normal intervals.  Patient has nonspecific ST changes however compared to his prior EKG largely unchanged.  Did not agree with computer interpretation of acute MI.  ____________________________________________    RADIOLOGY  Chest x-ray shows central catheter tip at the superior vena cava.  No acute abnormality.  ____________________________________________   INITIAL IMPRESSION / ASSESSMENT AND PLAN / ED COURSE  Pertinent labs & imaging results that were available during my care of the patient were reviewed by me and considered in my medical decision making (see chart for details).   Patient presents to  the emergency department for chest pain and shortness of breath since last night.  Patient had a catheter lysis procedure performed yesterday.  We will check labs including a D-dimer and cardiac enzymes.  We will obtain a chest x-ray and continue to closely monitor.  Patient does make urine each day.  Patient's labs are overall at baseline, troponin is negative but his D-dimer is mildly elevated 0.85.  I spoke to Dr. Juleen China regarding this.  After discussion with nephrology we will proceed with CTA of the chest to rule out pulmonary embolism  despite the patient's poor kidney function.   Repeat troponin and CTA are pending.  Patient care signed out to Dr. Jacqualine Code.  Gaius Tagen Milby was evaluated in Emergency Department on 02/21/2021 for the symptoms described in the history of present illness. He was evaluated in the context of the global COVID-19 pandemic, which necessitated consideration that the patient might be at risk for infection with the SARS-CoV-2 virus that causes COVID-19. Institutional protocols and algorithms that pertain to the evaluation of patients at risk for COVID-19 are in a state of rapid change based on information released by regulatory bodies including the CDC and federal and state organizations. These policies and algorithms were followed during the patient's care in the ED.  ____________________________________________   FINAL CLINICAL IMPRESSION(S) / ED DIAGNOSES  Chest pain Dyspnea   Harvest Dark, MD 02/21/21 1524

## 2021-02-21 NOTE — Telephone Encounter (Signed)
I called an left this Message on the pt's cell phone per the Np.

## 2021-02-25 ENCOUNTER — Ambulatory Visit: Payer: Medicare Other

## 2021-03-05 ENCOUNTER — Other Ambulatory Visit: Payer: Self-pay

## 2021-03-05 ENCOUNTER — Inpatient Hospital Stay: Admit: 2021-03-05 | Payer: Medicare Other

## 2021-03-05 ENCOUNTER — Emergency Department: Payer: Medicare Other

## 2021-03-05 ENCOUNTER — Inpatient Hospital Stay
Admission: EM | Admit: 2021-03-05 | Discharge: 2021-03-15 | DRG: 871 | Disposition: E | Payer: Medicare Other | Attending: Internal Medicine | Admitting: Internal Medicine

## 2021-03-05 DIAGNOSIS — Z4659 Encounter for fitting and adjustment of other gastrointestinal appliance and device: Secondary | ICD-10-CM

## 2021-03-05 DIAGNOSIS — E1122 Type 2 diabetes mellitus with diabetic chronic kidney disease: Secondary | ICD-10-CM | POA: Diagnosis present

## 2021-03-05 DIAGNOSIS — I472 Ventricular tachycardia: Secondary | ICD-10-CM | POA: Diagnosis not present

## 2021-03-05 DIAGNOSIS — Z515 Encounter for palliative care: Secondary | ICD-10-CM

## 2021-03-05 DIAGNOSIS — J90 Pleural effusion, not elsewhere classified: Secondary | ICD-10-CM

## 2021-03-05 DIAGNOSIS — R579 Shock, unspecified: Secondary | ICD-10-CM

## 2021-03-05 DIAGNOSIS — I132 Hypertensive heart and chronic kidney disease with heart failure and with stage 5 chronic kidney disease, or end stage renal disease: Secondary | ICD-10-CM | POA: Diagnosis present

## 2021-03-05 DIAGNOSIS — J869 Pyothorax without fistula: Secondary | ICD-10-CM | POA: Diagnosis not present

## 2021-03-05 DIAGNOSIS — J9811 Atelectasis: Secondary | ICD-10-CM | POA: Diagnosis present

## 2021-03-05 DIAGNOSIS — Z20822 Contact with and (suspected) exposure to covid-19: Secondary | ICD-10-CM | POA: Diagnosis present

## 2021-03-05 DIAGNOSIS — I484 Atypical atrial flutter: Secondary | ICD-10-CM | POA: Diagnosis present

## 2021-03-05 DIAGNOSIS — N186 End stage renal disease: Secondary | ICD-10-CM | POA: Diagnosis present

## 2021-03-05 DIAGNOSIS — Z79899 Other long term (current) drug therapy: Secondary | ICD-10-CM

## 2021-03-05 DIAGNOSIS — I5021 Acute systolic (congestive) heart failure: Secondary | ICD-10-CM | POA: Diagnosis not present

## 2021-03-05 DIAGNOSIS — D631 Anemia in chronic kidney disease: Secondary | ICD-10-CM | POA: Diagnosis present

## 2021-03-05 DIAGNOSIS — R570 Cardiogenic shock: Secondary | ICD-10-CM | POA: Diagnosis not present

## 2021-03-05 DIAGNOSIS — G4733 Obstructive sleep apnea (adult) (pediatric): Secondary | ICD-10-CM | POA: Diagnosis present

## 2021-03-05 DIAGNOSIS — Z905 Acquired absence of kidney: Secondary | ICD-10-CM

## 2021-03-05 DIAGNOSIS — J449 Chronic obstructive pulmonary disease, unspecified: Secondary | ICD-10-CM | POA: Diagnosis present

## 2021-03-05 DIAGNOSIS — I4510 Unspecified right bundle-branch block: Secondary | ICD-10-CM | POA: Diagnosis present

## 2021-03-05 DIAGNOSIS — N390 Urinary tract infection, site not specified: Secondary | ICD-10-CM | POA: Diagnosis present

## 2021-03-05 DIAGNOSIS — Z86718 Personal history of other venous thrombosis and embolism: Secondary | ICD-10-CM

## 2021-03-05 DIAGNOSIS — J9601 Acute respiratory failure with hypoxia: Secondary | ICD-10-CM | POA: Diagnosis not present

## 2021-03-05 DIAGNOSIS — Z9911 Dependence on respirator [ventilator] status: Secondary | ICD-10-CM

## 2021-03-05 DIAGNOSIS — J9621 Acute and chronic respiratory failure with hypoxia: Secondary | ICD-10-CM | POA: Diagnosis not present

## 2021-03-05 DIAGNOSIS — G8929 Other chronic pain: Secondary | ICD-10-CM | POA: Diagnosis present

## 2021-03-05 DIAGNOSIS — R0602 Shortness of breath: Secondary | ICD-10-CM

## 2021-03-05 DIAGNOSIS — I4819 Other persistent atrial fibrillation: Secondary | ICD-10-CM | POA: Diagnosis present

## 2021-03-05 DIAGNOSIS — Z8249 Family history of ischemic heart disease and other diseases of the circulatory system: Secondary | ICD-10-CM

## 2021-03-05 DIAGNOSIS — J9602 Acute respiratory failure with hypercapnia: Secondary | ICD-10-CM | POA: Diagnosis not present

## 2021-03-05 DIAGNOSIS — Z8673 Personal history of transient ischemic attack (TIA), and cerebral infarction without residual deficits: Secondary | ICD-10-CM

## 2021-03-05 DIAGNOSIS — Z992 Dependence on renal dialysis: Secondary | ICD-10-CM

## 2021-03-05 DIAGNOSIS — R531 Weakness: Secondary | ICD-10-CM | POA: Diagnosis not present

## 2021-03-05 DIAGNOSIS — F419 Anxiety disorder, unspecified: Secondary | ICD-10-CM | POA: Diagnosis present

## 2021-03-05 DIAGNOSIS — I4891 Unspecified atrial fibrillation: Secondary | ICD-10-CM

## 2021-03-05 DIAGNOSIS — R778 Other specified abnormalities of plasma proteins: Secondary | ICD-10-CM | POA: Diagnosis present

## 2021-03-05 DIAGNOSIS — N3000 Acute cystitis without hematuria: Secondary | ICD-10-CM | POA: Diagnosis not present

## 2021-03-05 DIAGNOSIS — G2581 Restless legs syndrome: Secondary | ICD-10-CM | POA: Diagnosis present

## 2021-03-05 DIAGNOSIS — Z6836 Body mass index (BMI) 36.0-36.9, adult: Secondary | ICD-10-CM

## 2021-03-05 DIAGNOSIS — Z9115 Patient's noncompliance with renal dialysis: Secondary | ICD-10-CM

## 2021-03-05 DIAGNOSIS — Z7189 Other specified counseling: Secondary | ICD-10-CM | POA: Diagnosis not present

## 2021-03-05 DIAGNOSIS — I313 Pericardial effusion (noninflammatory): Secondary | ICD-10-CM | POA: Diagnosis present

## 2021-03-05 DIAGNOSIS — Z7901 Long term (current) use of anticoagulants: Secondary | ICD-10-CM

## 2021-03-05 DIAGNOSIS — Z01818 Encounter for other preprocedural examination: Secondary | ICD-10-CM

## 2021-03-05 DIAGNOSIS — R918 Other nonspecific abnormal finding of lung field: Secondary | ICD-10-CM | POA: Diagnosis present

## 2021-03-05 DIAGNOSIS — J9600 Acute respiratory failure, unspecified whether with hypoxia or hypercapnia: Secondary | ICD-10-CM

## 2021-03-05 DIAGNOSIS — A419 Sepsis, unspecified organism: Principal | ICD-10-CM | POA: Diagnosis present

## 2021-03-05 DIAGNOSIS — N2581 Secondary hyperparathyroidism of renal origin: Secondary | ICD-10-CM | POA: Diagnosis present

## 2021-03-05 DIAGNOSIS — J9622 Acute and chronic respiratory failure with hypercapnia: Secondary | ICD-10-CM | POA: Diagnosis not present

## 2021-03-05 DIAGNOSIS — I3139 Other pericardial effusion (noninflammatory): Secondary | ICD-10-CM

## 2021-03-05 DIAGNOSIS — Z87891 Personal history of nicotine dependence: Secondary | ICD-10-CM

## 2021-03-05 DIAGNOSIS — M549 Dorsalgia, unspecified: Secondary | ICD-10-CM | POA: Diagnosis present

## 2021-03-05 DIAGNOSIS — R34 Anuria and oliguria: Secondary | ICD-10-CM | POA: Diagnosis present

## 2021-03-05 DIAGNOSIS — J918 Pleural effusion in other conditions classified elsewhere: Secondary | ICD-10-CM | POA: Diagnosis present

## 2021-03-05 DIAGNOSIS — I2510 Atherosclerotic heart disease of native coronary artery without angina pectoris: Secondary | ICD-10-CM | POA: Diagnosis present

## 2021-03-05 DIAGNOSIS — K8689 Other specified diseases of pancreas: Secondary | ICD-10-CM | POA: Diagnosis present

## 2021-03-05 DIAGNOSIS — I255 Ischemic cardiomyopathy: Secondary | ICD-10-CM | POA: Diagnosis present

## 2021-03-05 DIAGNOSIS — E669 Obesity, unspecified: Secondary | ICD-10-CM | POA: Diagnosis present

## 2021-03-05 DIAGNOSIS — Z9689 Presence of other specified functional implants: Secondary | ICD-10-CM

## 2021-03-05 DIAGNOSIS — J9620 Acute and chronic respiratory failure, unspecified whether with hypoxia or hypercapnia: Secondary | ICD-10-CM

## 2021-03-05 DIAGNOSIS — R7302 Impaired glucose tolerance (oral): Secondary | ICD-10-CM | POA: Diagnosis present

## 2021-03-05 DIAGNOSIS — Z66 Do not resuscitate: Secondary | ICD-10-CM | POA: Diagnosis not present

## 2021-03-05 DIAGNOSIS — Z85528 Personal history of other malignant neoplasm of kidney: Secondary | ICD-10-CM

## 2021-03-05 DIAGNOSIS — E119 Type 2 diabetes mellitus without complications: Secondary | ICD-10-CM

## 2021-03-05 DIAGNOSIS — Z9884 Bariatric surgery status: Secondary | ICD-10-CM

## 2021-03-05 DIAGNOSIS — Z7984 Long term (current) use of oral hypoglycemic drugs: Secondary | ICD-10-CM

## 2021-03-05 DIAGNOSIS — Z9049 Acquired absence of other specified parts of digestive tract: Secondary | ICD-10-CM

## 2021-03-05 DIAGNOSIS — F32A Depression, unspecified: Secondary | ICD-10-CM | POA: Diagnosis present

## 2021-03-05 DIAGNOSIS — Z833 Family history of diabetes mellitus: Secondary | ICD-10-CM

## 2021-03-05 DIAGNOSIS — E785 Hyperlipidemia, unspecified: Secondary | ICD-10-CM | POA: Diagnosis present

## 2021-03-05 DIAGNOSIS — Z978 Presence of other specified devices: Secondary | ICD-10-CM

## 2021-03-05 DIAGNOSIS — Z938 Other artificial opening status: Secondary | ICD-10-CM

## 2021-03-05 DIAGNOSIS — G8918 Other acute postprocedural pain: Secondary | ICD-10-CM

## 2021-03-05 DIAGNOSIS — R000 Tachycardia, unspecified: Secondary | ICD-10-CM

## 2021-03-05 LAB — GLUCOSE, CAPILLARY: Glucose-Capillary: 92 mg/dL (ref 70–99)

## 2021-03-05 LAB — CBC WITH DIFFERENTIAL/PLATELET
Abs Immature Granulocytes: 0.03 10*3/uL (ref 0.00–0.07)
Basophils Absolute: 0 10*3/uL (ref 0.0–0.1)
Basophils Relative: 1 %
Eosinophils Absolute: 0 10*3/uL (ref 0.0–0.5)
Eosinophils Relative: 0 %
HCT: 30.2 % — ABNORMAL LOW (ref 39.0–52.0)
Hemoglobin: 9.3 g/dL — ABNORMAL LOW (ref 13.0–17.0)
Immature Granulocytes: 0 %
Lymphocytes Relative: 9 %
Lymphs Abs: 0.7 10*3/uL (ref 0.7–4.0)
MCH: 29.3 pg (ref 26.0–34.0)
MCHC: 30.8 g/dL (ref 30.0–36.0)
MCV: 95.3 fL (ref 80.0–100.0)
Monocytes Absolute: 0.6 10*3/uL (ref 0.1–1.0)
Monocytes Relative: 7 %
Neutro Abs: 6.7 10*3/uL (ref 1.7–7.7)
Neutrophils Relative %: 83 %
Platelets: 300 10*3/uL (ref 150–400)
RBC: 3.17 MIL/uL — ABNORMAL LOW (ref 4.22–5.81)
RDW: 14.9 % (ref 11.5–15.5)
WBC: 8.1 10*3/uL (ref 4.0–10.5)
nRBC: 0 % (ref 0.0–0.2)

## 2021-03-05 LAB — COMPREHENSIVE METABOLIC PANEL
ALT: 110 U/L — ABNORMAL HIGH (ref 0–44)
AST: 142 U/L — ABNORMAL HIGH (ref 15–41)
Albumin: 3 g/dL — ABNORMAL LOW (ref 3.5–5.0)
Alkaline Phosphatase: 88 U/L (ref 38–126)
Anion gap: 16 — ABNORMAL HIGH (ref 5–15)
BUN: 64 mg/dL — ABNORMAL HIGH (ref 8–23)
CO2: 25 mmol/L (ref 22–32)
Calcium: 7 mg/dL — ABNORMAL LOW (ref 8.9–10.3)
Chloride: 96 mmol/L — ABNORMAL LOW (ref 98–111)
Creatinine, Ser: 6.23 mg/dL — ABNORMAL HIGH (ref 0.61–1.24)
GFR, Estimated: 9 mL/min — ABNORMAL LOW (ref >60)
Glucose, Bld: 204 mg/dL — ABNORMAL HIGH (ref 70–99)
Potassium: 3.7 mmol/L (ref 3.5–5.1)
Sodium: 137 mmol/L (ref 135–145)
Total Bilirubin: 0.9 mg/dL (ref 0.3–1.2)
Total Protein: 6.4 g/dL — ABNORMAL LOW (ref 6.5–8.1)

## 2021-03-05 LAB — URINALYSIS, COMPLETE (UACMP) WITH MICROSCOPIC
Bilirubin Urine: NEGATIVE
Glucose, UA: NEGATIVE mg/dL
Hgb urine dipstick: NEGATIVE
Ketones, ur: NEGATIVE mg/dL
Nitrite: NEGATIVE
Protein, ur: 100 mg/dL — AB
Specific Gravity, Urine: 1.018 (ref 1.005–1.030)
WBC, UA: >50 WBC/hpf — ABNORMAL HIGH (ref 0–5)
pH: 5 (ref 5.0–8.0)

## 2021-03-05 LAB — RESP PANEL BY RT-PCR (FLU A&B, COVID) ARPGX2
Influenza A by PCR: NEGATIVE
Influenza B by PCR: NEGATIVE
SARS Coronavirus 2 by RT PCR: NEGATIVE

## 2021-03-05 LAB — TROPONIN I (HIGH SENSITIVITY)
Troponin I (High Sensitivity): 24 ng/L — ABNORMAL HIGH (ref <18)
Troponin I (High Sensitivity): 27 ng/L — ABNORMAL HIGH (ref <18)

## 2021-03-05 LAB — BRAIN NATRIURETIC PEPTIDE: B Natriuretic Peptide: 530.6 pg/mL — ABNORMAL HIGH (ref 0.0–100.0)

## 2021-03-05 MED ORDER — ROSUVASTATIN CALCIUM 10 MG PO TABS
10.0000 mg | ORAL_TABLET | Freq: Every day | ORAL | Status: DC
  Administered 2021-03-05 – 2021-03-09 (×5): 10 mg via ORAL
  Filled 2021-03-05 (×5): qty 1

## 2021-03-05 MED ORDER — ACETAMINOPHEN 325 MG PO TABS
650.0000 mg | ORAL_TABLET | Freq: Four times a day (QID) | ORAL | Status: DC | PRN
  Filled 2021-03-05: qty 2

## 2021-03-05 MED ORDER — ETOMIDATE 2 MG/ML IV SOLN
8.0000 mg | Freq: Once | INTRAVENOUS | Status: AC
  Administered 2021-03-05: 8 mg via INTRAVENOUS
  Filled 2021-03-05: qty 10

## 2021-03-05 MED ORDER — SODIUM CHLORIDE 0.9 % IV SOLN
1.0000 g | Freq: Once | INTRAVENOUS | Status: AC
  Administered 2021-03-05: 1 g via INTRAVENOUS
  Filled 2021-03-05: qty 1

## 2021-03-05 MED ORDER — VANCOMYCIN HCL IN DEXTROSE 1-5 GM/200ML-% IV SOLN: 1000.0000 mg | INTRAVENOUS | Status: DC

## 2021-03-05 MED ORDER — SODIUM CHLORIDE 0.9 % IV BOLUS
250.0000 mL | Freq: Once | INTRAVENOUS | Status: AC
  Administered 2021-03-05: 250 mL via INTRAVENOUS

## 2021-03-05 MED ORDER — EZETIMIBE 10 MG PO TABS
10.0000 mg | ORAL_TABLET | Freq: Every day | ORAL | Status: DC
  Administered 2021-03-06 – 2021-03-09 (×3): 10 mg via ORAL
  Filled 2021-03-05 (×5): qty 1

## 2021-03-05 MED ORDER — VANCOMYCIN HCL 750 MG/150ML IV SOLN
750.0000 mg | INTRAVENOUS | Status: DC
  Filled 2021-03-05: qty 150

## 2021-03-05 MED ORDER — SODIUM CHLORIDE 0.9 % IV SOLN
1.0000 g | INTRAVENOUS | Status: DC
  Administered 2021-03-06: 1 g via INTRAVENOUS
  Filled 2021-03-05 (×2): qty 1

## 2021-03-05 MED ORDER — APIXABAN 2.5 MG PO TABS
2.5000 mg | ORAL_TABLET | Freq: Two times a day (BID) | ORAL | Status: DC
  Administered 2021-03-05 – 2021-03-06 (×3): 2.5 mg via ORAL
  Filled 2021-03-05 (×4): qty 1

## 2021-03-05 MED ORDER — MIRTAZAPINE 15 MG PO TABS
45.0000 mg | ORAL_TABLET | Freq: Every day | ORAL | Status: DC
  Administered 2021-03-05 – 2021-03-09 (×5): 45 mg via ORAL
  Filled 2021-03-05 (×5): qty 3

## 2021-03-05 MED ORDER — PANCRELIPASE (LIP-PROT-AMYL) 36000-114000 UNITS PO CPEP
72000.0000 [IU] | ORAL_CAPSULE | Freq: Three times a day (TID) | ORAL | Status: DC
  Administered 2021-03-06: 72000 [IU] via ORAL
  Filled 2021-03-05 (×4): qty 2

## 2021-03-05 MED ORDER — CLONAZEPAM 0.25 MG PO TBDP
0.2500 mg | ORAL_TABLET | Freq: Two times a day (BID) | ORAL | Status: DC | PRN
  Administered 2021-03-09 – 2021-03-10 (×2): 0.25 mg via ORAL
  Filled 2021-03-05 (×2): qty 1

## 2021-03-05 MED ORDER — CARVEDILOL 6.25 MG PO TABS
6.2500 mg | ORAL_TABLET | Freq: Two times a day (BID) | ORAL | Status: DC
  Administered 2021-03-06: 6.25 mg via ORAL
  Filled 2021-03-05 (×2): qty 1

## 2021-03-05 MED ORDER — CLONAZEPAM 0.5 MG PO TABS: 0.2500 mg | ORAL_TABLET | Freq: Two times a day (BID) | ORAL | Status: DC | PRN

## 2021-03-05 MED ORDER — SODIUM CHLORIDE 0.9 % IV BOLUS
1000.0000 mL | Freq: Once | INTRAVENOUS | Status: AC
  Administered 2021-03-05: 1000 mL via INTRAVENOUS

## 2021-03-05 MED ORDER — SUCRALFATE 1 G PO TABS
1.0000 g | ORAL_TABLET | Freq: Three times a day (TID) | ORAL | Status: DC
  Administered 2021-03-05 – 2021-03-09 (×10): 1 g via ORAL
  Filled 2021-03-05 (×11): qty 1

## 2021-03-05 MED ORDER — ROPINIROLE HCL 1 MG PO TABS
4.0000 mg | ORAL_TABLET | Freq: Two times a day (BID) | ORAL | Status: DC
  Administered 2021-03-05 – 2021-03-09 (×8): 4 mg via ORAL
  Filled 2021-03-05 (×12): qty 4

## 2021-03-05 MED ORDER — DULOXETINE HCL 30 MG PO CPEP
120.0000 mg | ORAL_CAPSULE | Freq: Every day | ORAL | Status: DC
  Administered 2021-03-06 – 2021-03-09 (×3): 120 mg via ORAL
  Filled 2021-03-05: qty 4
  Filled 2021-03-05: qty 2
  Filled 2021-03-05 (×3): qty 4

## 2021-03-05 MED ORDER — VANCOMYCIN HCL 1750 MG/350ML IV SOLN
1750.0000 mg | Freq: Once | INTRAVENOUS | Status: AC
  Administered 2021-03-05: 1750 mg via INTRAVENOUS
  Filled 2021-03-05: qty 350

## 2021-03-05 MED ORDER — PANTOPRAZOLE SODIUM 40 MG PO TBEC
40.0000 mg | DELAYED_RELEASE_TABLET | Freq: Two times a day (BID) | ORAL | Status: DC
  Administered 2021-03-05 – 2021-03-10 (×9): 40 mg via ORAL
  Filled 2021-03-05 (×9): qty 1

## 2021-03-05 NOTE — ED Triage Notes (Signed)
Pt to ED [facility] from home for resp distress. On NRB on arrival. Dyspnea with rest. HR 160 with spb 70s.  Confusion on whether or not pt has missed dialysis.  covid +4 weeks.  MWF dialysis States bp has been low for about a week. Has recently stopped labatelol.  shob since Friday with BLE swelling  Pt placed on pads

## 2021-03-05 NOTE — H&P (Signed)
History and Physical  [name] TGG:[id number] DOB: 1946-10-08 DOA: 02/14/2021  Referring physician: Dr. Ellender Hose PCP: Derinda Late, MD  Outpatient Specialists: Nephrology. Patient coming from: Home.  Chief Complaint: Shortness of breath, generalized weakness, no urine output in the last 2 days  HPI: [name] is a 75 y.o. male with medical history significant for ESRD on HD MWF, history of DVT on Eliquis, essential hypertension, chronic anxiety/depression, type 2 diabetes, hyperlipidemia, who presented to [facility] due to gradually worsening shortness of breath, generalized weakness with onset on Friday 5 days ago and worsened over the weekend.  Noted that he stopped making urine in the last 2 days.  Positive orthopnea.  Denies chest pain.  Denies any GI symptoms.  On presentation to the ED, he was found to be in A-flutter with a rate in the 160s, BP 77/60, he was cardioverted in the ED to sinus rhythm.  BP improved.  Urine analysis was positive for pyuria.  CXR could not rule out pneumonia, showing possible pericardial effusion.  Due to concern for UTI and HCAP, patient was started on Cefepime and IV vancomycin empirically in the ED.  EDP has consulted nephrology for hemodialysis.  Cardiology has also been consulted due to recent A-flutter and possible pericardial effusion on CXR.  ED Course:  Post cardioversion, BP 87/59, pulse 94, respiratory 25, O2 saturation 98% on 5L.  Lab studies remarkable for serum sodium 137, glucose 204, BUN 64, creatinine 6.2, anion gap 16.  AST 142, ALT 110, BNP 530, troponin 24.  WBC 8.1, hemoglobin 9.3, platelets 300.  Review of Systems: Review of systems as noted in the HPI. All other systems reviewed and are negative.   Past Medical History:  Diagnosis Date  . Chronic back pain   . COPD (chronic obstructive pulmonary disease) ([place])   . Depression   . Diabetes mellitus without complication ([place])   . Hyperlipidemia   . Hypertension   .  Renal cell carcinoma ([place])   . Sleep apnea    Past Surgical History:  Procedure Laterality Date  . CARPAL TUNNEL RELEASE    . CHOLECYSTECTOMY    . COLONOSCOPY WITH PROPOFOL N/A 07/29/2017   Procedure: COLONOSCOPY WITH PROPOFOL;  Surgeon: Manya Silvas, MD;  Location[facility] ENDOSCOPY;  Service: Endoscopy;  Laterality: N/A;  . colonoscopys    . DIALYSIS/PERMA CATHETER INSERTION N/A 10/09/2020   Procedure: DIALYSIS/PERMA CATHETER INSERTION;  Surgeon: Katha Cabal, MD;  Location: [place] CV LAB;  Service: Cardiovascular;  Laterality: N/A;  . ESOPHAGOGASTRODUODENOSCOPY (EGD) WITH PROPOFOL N/A 07/29/2017   Procedure: ESOPHAGOGASTRODUODENOSCOPY (EGD) WITH PROPOFOL;  Surgeon: Manya Silvas, MD;  Location: [facility] ENDOSCOPY;  Service: Endoscopy;  Laterality: N/A;  . FLEXIBLE SIGMOIDOSCOPY    . GASTRIC BYPASS    . NEPHRECTOMY    . open reduction and internal fixation, right distal radius    . right knee arthroscopy    . TEMPORARY DIALYSIS CATHETER N/A 10/02/2020   Procedure: TEMPORARY DIALYSIS CATHETER;  Surgeon: Katha Cabal, MD;  Location: [place] CV LAB;  Service: Cardiovascular;  Laterality: N/A;  . UPPER GI ENDOSCOPY      Social History:  reports that he has quit smoking. He has a 30.00 pack-year smoking history. His smokeless tobacco use includes chew. He reports that he does not drink alcohol and does not use drugs.   Allergies  Allergen Reactions  . Bupropion     Other reaction(s): Other (See Comments) Mood swings, angry Other reaction(s): Other (See Comments)  Mood swings, angry    Family History  Problem Relation Age of Onset  . Diabetes Mother   . Cancer Mother   . Heart disease Father       Prior to Admission medications   Medication Sig Start Date End Date Taking? Authorizing Provider  apixaban (ELIQUIS) 2.5 MG TABS tablet Take 1 tablet (2.5 mg total) by mouth 2 (two) times daily. 10/12/20  Yes Swayze, Ava, DO  glipiZIDE (GLUCOTROL XL) 10 MG  24 hr tablet Take 10 mg by mouth daily. 07/25/20  Yes [provider]  albuterol (VENTOLIN HFA) 108 (90 Base) MCG/ACT inhaler Inhale 2 puffs into the lungs every 6 (six) hours as needed for wheezing.  05/09/20   [provider]  carvedilol (COREG) 6.25 MG tablet Take 6.25 mg by mouth 2 (two) times daily with a meal.    [provider]  clonazePAM (KLONOPIN) 0.5 MG tablet Take 0.5 mg by mouth 2 (two) times daily as needed. 06/25/20   [provider]  DULoxetine (CYMBALTA) 60 MG capsule Take 120 mg by mouth daily.  09/01/19   [provider]  ezetimibe (ZETIA) 10 MG tablet Take by mouth. 11/22/20 11/22/21  [provider]  lipase/protease/amylase (CREON) 36000 UNITS CPEP capsule Take 2 capsules by mouth 3 (three) times daily with meals. 09/21/20 09/21/21  [provider]  mirtazapine (REMERON) 45 MG tablet Take 45 mg by mouth at bedtime.  07/05/20   [provider]  multivitamin (RENA-VIT) TABS tablet Take 1 tablet by mouth at bedtime. 10/12/20   Swayze, Ava, DO  Oxycodone HCl 10 MG TABS Take 1 tablet (10 mg total) by mouth 3 (three) times daily as needed. Must last 30 days. 03/04/21 04/03/21  Gillis Santa, MD  pantoprazole (PROTONIX) 40 MG tablet Take 40 mg by mouth 2 (two) times daily.     [provider]  rOPINIRole (REQUIP) 4 MG tablet Take 1 tablet (4 mg total) by mouth 2 (two) times daily before a meal. 10/12/20   Swayze, Ava, DO  rosuvastatin (CRESTOR) 10 MG tablet Take 1 tablet (10 mg total) by mouth daily. 11/09/20   Pokhrel, Corrie Mckusick, MD  sucralfate (CARAFATE) 1 g tablet Take 1 g by mouth 3 (three) times daily.     [provider]  thiamine (VITAMIN B-1) 100 MG tablet Take 100 mg by mouth daily. Patient not taking: Reported on 02/20/2021    [provider]    Physical Exam: BP (!) 85/58   Pulse 99   Temp 98.7 F (37.1 C) (Oral)   Resp (!) 23   Ht 6' (1.829 m)   Wt 113.4 kg   SpO2 95%   BMI 33.91  kg/m   . General: 75 y.o. year-old male well developed well nourished in no acute distress.  Alert and oriented x3. . Cardiovascular: Regular rate and rhythm with no rubs or gallops.  No thyromegaly or JVD noted.  Trace lower extremity edema. 2/4 pulses in all 4 extremities. Marland Kitchen Respiratory: Mild rales at bases with no wheezing noted. Poor inspiratory effort. . Abdomen: Soft nontender nondistended with normal bowel sounds x4 quadrants. . Muskuloskeletal: No cyanosis or clubbing. Trace edema noted in lower extremities bilaterally . Neuro: CN II-XII intact, strength, sensation, reflexes . Skin: No ulcerative lesions noted or rashes . Psychiatry: Judgement and insight appear normal. Mood is appropriate for condition and setting          Labs on Admission:  Basic Metabolic Panel: Recent Labs  Lab 02/19/2021  1325  NA 137  K 3.7  CL 96*  CO2 25  GLUCOSE 204*  BUN 64*  CREATININE 6.23*  CALCIUM 7.0*   Liver Function Tests: Recent Labs  Lab 02/28/2021 1325  AST 142*  ALT 110*  ALKPHOS 88  BILITOT 0.9  PROT 6.4*  ALBUMIN 3.0*   No results for input(s): LIPASE, AMYLASE in the last 168 hours. No results for input(s): AMMONIA in the last 168 hours. CBC: Recent Labs  Lab 03/13/2021 1325  WBC 8.1  NEUTROABS 6.7  HGB 9.3*  HCT 30.2*  MCV 95.3  PLT 300   Cardiac Enzymes: No results for input(s): CKTOTAL, CKMB, CKMBINDEX, TROPONINI in the last 168 hours.  BNP (last 3 results) Recent Labs    03/25/20 0557 02/17/2021 1325  BNP 31.0 530.6*    ProBNP (last 3 results) No results for input(s): PROBNP in the last 8760 hours.  CBG: No results for input(s): GLUCAP in the last 168 hours.  Radiological Exams on Admission: DG Chest Port 1 View  Result Date: 03/11/2021 CLINICAL DATA:  Short of breath and edema.  Missed dialysis. EXAM: PORTABLE CHEST 1 VIEW COMPARISON:  02/21/2021 FINDINGS: Marked cardiac enlargement with progression. Possible pericardial effusion. Vascular congestion.  Negative for edema. Interval development of small to moderate left pleural effusion and left lower lobe atelectasis. Dialysis catheter in the lower SVC unchanged. IMPRESSION: Interval development marked cardiac enlargement suggesting pericardial effusion Interval development of left pleural effusion and left lower lobe airspace disease. Electronically Signed   By: Franchot Gallo M.D.   On: 02/13/2021 14:42    EKG: I independently viewed the EKG done and my findings are as followed: Sinus tachycardia rate of 99.  Nonspecific ST-T changes.  QTc 492.  Assessment/Plan Present on Admission: . UTI (urinary tract infection)  Active Problems:   UTI (urinary tract infection)   Generalized weakness, thought to be secondary to UTI versus HCAP, POA. Presented with generalized weakness of 5 days duration acutely worsening over the weekend. States he stopped making urine in the last 2 days.  Not usually anuric. Urinalysis positive for pyuria Chest x-ray with concern for pulmonary infiltrate and possible pericardial effusion Started on cefepime and IV vancomycin empirically in the ED. Follow urine culture and blood cultures Currently afebrile no leukocytosis. PT OT to assess Orthostatic vital signs Fall precautions.  A-flutter, post cardioversion on 02/18/2021 in the ED. Continue home Eliquis which he was taking for history of DVT. Obtain 2D echo Cardiology has been consulted, Dr. Ubaldo Glassing. Closely monitor on telemetry.  Possible pericardial effusion seen on chest x-ray Personally reviewed chest x-ray which shows marked cardiomegaly, balloon appearing, with concern for possible pericardial effusion 2D echo ordered, follow results. Dr. Ubaldo Glassing, cardiology, made aware  ESRD on HD Nephrology consulted to resume hemodialysis Last hemodialysis was  Hypotension likely secondary to a flutter versus active infective process, possible UTI versus HCAP, vs pericardiac effusion with tamponade if  present? Maintain MAP greater than 65. May need midodrine during hemodialysis treatments. Continue to closely monitor vital signs.  Type of diabetes Obtain hemoglobin A1c Hold off home oral hypoglycemics Insulin sensitive.  Hyperlipidemia/chronic anxiety/restless leg syndrome Resume home regimen.    DVT prophylaxis: Eliquis.  Code Status: Full code, per the patient.   Family Communication: None at bedside.  Disposition Plan: Admit to MedSurg unit with remote telemetry.  Consults called: Cardiology, nephrology.  Admission status: Inpatient status.  Patient will require at least 2 midnights for further evaluation and treatment of present condition.  Status is: Inpatient   Dispo:  Patient From: Home  Planned Disposition: Home  Anticipated date of discharge 02/22/2021 or when cardiology and nephrology sign off.  Medically stable for discharge: No         Kayleen Memos MD [facility] Pager [phone]  If 7PM-7AM, please contact night-coverage [email or web address] Password [facility]  02/15/2021, 4:08 PM

## 2021-03-05 NOTE — ED Notes (Signed)
MD Isaacs at bedside  

## 2021-03-05 NOTE — ED Provider Notes (Signed)
[facility] Emergency Department Provider Note  ____________________________________________   Event Date/Time   First MD Initiated Contact with Patient 02/27/2021 1333     (approximate)  I have reviewed the triage vital signs and the nursing notes.   HISTORY  Chief Complaint Respiratory Distress    HPI [name] is a 75 y.o. male  here  with respiratory distress and weakness.  The patient is end-stage renal disease and states that over the last several days he is having some worsening weakness.  He has been short of breath.  He went to dialysis on Friday and had to stop due to hypotension.  He could not go yesterday due to weakness.  States he has felt fatigued, lightheaded, and dizzy with any amount of standing or exertion.  No alleviating factors.  Denies any fevers.  He has had some mild sputum production occasionally.  He reports that he has been otherwise adherent with his medications.  He has had some fatigue.  Denies known fevers.  He also notes that he has had decreased urinary output.  He normally urinates daily but has not urinated last 3 days.  No other complaints.       Past Medical History:  Diagnosis Date  . Chronic back pain   . COPD (chronic obstructive pulmonary disease) ([place])   . Depression   . Diabetes mellitus without complication ([place])   . Hyperlipidemia   . Hypertension   . Renal cell carcinoma ([place])   . Sleep apnea     Patient Active Problem List   Diagnosis Date Noted  . UTI (urinary tract infection) 02/14/2021  . ESRD on dialysis ([place]) 01/22/2021  . Acute CVA (cerebrovascular accident) ([place]) 11/07/2020  . Malnutrition of moderate degree 10/11/2020  . History of renal cell carcinoma 09/30/2020  . History of left nephrectomy 2004 09/30/2020  . AKI (acute kidney injury) ([place]) 09/30/2020  . Hypoglycemia 09/30/2020  . Pancreatic insufficiency 09/30/2020  . Chronic diarrhea 09/30/2020  . Metabolic acidosis  94/70/9628  . Hand numbness 04/28/2020  . CAP (community acquired pneumonia) 03/25/2020  . Acute on chronic respiratory failure with hypoxia ([place]) 03/25/2020  . Hyperlipidemia   . COPD (chronic obstructive pulmonary disease) ([place])   . Depression   . CKD stage 3 due to type 2 diabetes mellitus ([place])   . Cellulitis 01/10/2020  . Proteinuria 09/20/2019  . Hyperkalemia 07/05/2019  . Lumbar degenerative disc disease 05/11/2019  . Spinal stenosis, lumbar region, with neurogenic claudication 05/11/2019  . Ankylosis of lumbar spine 05/11/2019  . Neuroforaminal stenosis of lumbar spine (left, L5/S1) 05/11/2019  . Lumbar facet arthropathy 05/11/2019  . Chronic pain syndrome 05/11/2019  . Chronic obstructive pulmonary disease ([place]) 01/03/2019  . Atherosclerotic peripheral vascular disease ([place]) 09/01/2018  . Lymphedema 06/22/2018  . Iron deficiency anemia 05/01/2018  . Anemia in chronic kidney disease 04/26/2018  . Varicose veins of leg with swelling, bilateral 04/09/2018  . SOB (shortness of breath) on exertion 03/17/2018  . Bilateral lower extremity edema 02/23/2018  . Lower extremity pain, bilateral 02/23/2018  . Essential hypertension 02/23/2018  . H/O deep venous thrombosis 01/23/2017  . Type II diabetes mellitus with renal manifestations ([place]) 06/19/2014  . Chronic kidney disease, unspecified 06/19/2014  . Anxiety state 06/19/2014  . Other and unspecified hyperlipidemia 06/19/2014    Past Surgical History:  Procedure Laterality Date  . CARPAL TUNNEL RELEASE    . CHOLECYSTECTOMY    . COLONOSCOPY WITH PROPOFOL N/A 07/29/2017   Procedure: COLONOSCOPY  WITH PROPOFOL;  Surgeon: Manya Silvas, MD;  Location: [facility] ENDOSCOPY;  Service: Endoscopy;  Laterality: N/A;  . colonoscopys    . DIALYSIS/PERMA CATHETER INSERTION N/A 10/09/2020   Procedure: DIALYSIS/PERMA CATHETER INSERTION;  Surgeon: Katha Cabal, MD;  Location: [place] CV LAB;  Service: Cardiovascular;  Laterality:  N/A;  . ESOPHAGOGASTRODUODENOSCOPY (EGD) WITH PROPOFOL N/A 07/29/2017   Procedure: ESOPHAGOGASTRODUODENOSCOPY (EGD) WITH PROPOFOL;  Surgeon: Manya Silvas, MD;  Location: [facility] ENDOSCOPY;  Service: Endoscopy;  Laterality: N/A;  . FLEXIBLE SIGMOIDOSCOPY    . GASTRIC BYPASS    . NEPHRECTOMY    . open reduction and internal fixation, right distal radius    . right knee arthroscopy    . TEMPORARY DIALYSIS CATHETER N/A 10/02/2020   Procedure: TEMPORARY DIALYSIS CATHETER;  Surgeon: Katha Cabal, MD;  Location: [place] [facility] LAB;  Service: Cardiovascular;  Laterality: N/A;  . UPPER GI ENDOSCOPY      Prior to Admission medications   Medication Sig Start Date End Date Taking? Authorizing Provider  apixaban (ELIQUIS) 2.5 MG TABS tablet Take 1 tablet (2.5 mg total) by mouth 2 (two) times daily. 10/12/20  Yes Swayze, Ava, DO  glipiZIDE (GLUCOTROL XL) 10 MG 24 hr tablet Take 10 mg by mouth daily. 07/25/20  Yes [provider]  albuterol (VENTOLIN HFA) 108 (90 Base) MCG/ACT inhaler Inhale 2 puffs into the lungs every 6 (six) hours as needed for wheezing.  05/09/20   [provider]  carvedilol (COREG) 6.25 MG tablet Take 6.25 mg by mouth 2 (two) times daily with a meal.    [provider]  clonazePAM (KLONOPIN) 0.5 MG tablet Take 0.5 mg by mouth 2 (two) times daily as needed. 06/25/20   [provider]  DULoxetine (CYMBALTA) 60 MG capsule Take 120 mg by mouth daily.  09/01/19   [provider]  ezetimibe (ZETIA) 10 MG tablet Take by mouth. 11/22/20 11/22/21  [provider]  lipase/protease/amylase (CREON) 36000 UNITS CPEP capsule Take 2 capsules by mouth 3 (three) times daily with meals. 09/21/20 09/21/21  [provider]  mirtazapine (REMERON) 45 MG tablet Take 45 mg by mouth at bedtime.  07/05/20   [provider]  multivitamin (RENA-VIT) TABS tablet Take 1 tablet by mouth at bedtime. 10/12/20   Swayze, Ava, DO  Oxycodone HCl 10  MG TABS Take 1 tablet (10 mg total) by mouth 3 (three) times daily as needed. Must last 30 days. 03/04/21 04/03/21  Gillis Santa, MD  pantoprazole (PROTONIX) 40 MG tablet Take 40 mg by mouth 2 (two) times daily.     [provider]  rOPINIRole (REQUIP) 4 MG tablet Take 1 tablet (4 mg total) by mouth 2 (two) times daily before a meal. 10/12/20   Swayze, Ava, DO  rosuvastatin (CRESTOR) 10 MG tablet Take 1 tablet (10 mg total) by mouth daily. 11/09/20   Pokhrel, Corrie Mckusick, MD  sucralfate (CARAFATE) 1 g tablet Take 1 g by mouth 3 (three) times daily.     [provider]  thiamine (VITAMIN B-1) 100 MG tablet Take 100 mg by mouth daily. Patient not taking: Reported on 02/20/2021    [provider]    Allergies Bupropion  Family History  Problem Relation Age of Onset  . Diabetes Mother   . Cancer Mother   . Heart disease Father     Social History Social History   Tobacco Use  . Smoking status: Former Smoker    Packs/day: 1.00  Years: 30.00    Pack years: 30.00  . Smokeless tobacco: Current User    Types: Chew  Vaping Use  . Vaping Use: Never used  Substance Use Topics  . Alcohol use: No  . Drug use: No    Review of Systems  Review of Systems  Constitutional: Positive for chills and fatigue. Negative for fever.  HENT: Negative for sore throat.   Respiratory: Positive for cough and shortness of breath.   Cardiovascular: Negative for chest pain.  Gastrointestinal: Positive for nausea. Negative for abdominal pain.  Genitourinary: Negative for flank pain.  Musculoskeletal: Negative for neck pain.  Skin: Negative for rash and wound.  Allergic/Immunologic: Negative for immunocompromised state.  Neurological: Positive for weakness. Negative for numbness.  Hematological: Does not bruise/bleed easily.  All other systems reviewed and are negative.    ____________________________________________  PHYSICAL EXAM:      VITAL SIGNS: ED Triage Vitals  Enc  Vitals Group     BP 03/07/2021 1331 (!) 77/60     Pulse Rate 02/27/2021 1323 (!) 159     Resp 03/07/2021 1323 (!) 27     Temp 02/13/2021 1323 98.7 F (37.1 C)     Temp Source 03/09/2021 1323 Oral     SpO2 02/27/2021 1323 100 %     Weight 02/15/2021 1326 250 lb (113.4 kg)     Height 03/11/2021 1326 6' (1.829 m)     Head Circumference --      Peak Flow --      Pain Score 03/04/2021 1326 0     Pain Loc --      Pain Edu? --      Excl. in [place]? --      Physical Exam Vitals and nursing note reviewed.  Constitutional:      General: He is not in acute distress.    Appearance: He is well-developed. He is ill-appearing and diaphoretic.  HENT:     Head: Normocephalic and atraumatic.     Mouth/Throat:     Mouth: Mucous membranes are dry.  Eyes:     Conjunctiva/sclera: Conjunctivae normal.  Cardiovascular:     Rate and Rhythm: Tachycardia present. Rhythm irregular.     Heart sounds: Normal heart sounds.  Pulmonary:     Effort: Pulmonary effort is normal. No respiratory distress.     Breath sounds: Rales present. No wheezing.  Abdominal:     General: There is no distension.  Musculoskeletal:     Cervical back: Neck supple.     Right lower leg: Edema present.     Left lower leg: Edema present.  Skin:    General: Skin is warm.     Capillary Refill: Capillary refill takes less than 2 seconds.     Findings: No rash.  Neurological:     Mental Status: He is alert and oriented to person, place, and time.     Motor: No abnormal muscle tone.       ____________________________________________   LABS (all labs ordered are listed, but only abnormal results are displayed)  Labs Reviewed  COMPREHENSIVE METABOLIC PANEL - Abnormal; Notable for the following components:      Result Value   Chloride 96 (*)    Glucose, Bld 204 (*)    BUN 64 (*)    Creatinine, Ser 6.23 (*)    Calcium 7.0 (*)    Total Protein 6.4 (*)    Albumin 3.0 (*)    AST 142 (*)    ALT 110 (*)  GFR, Estimated 9 (*)    Anion  gap 16 (*)    All other components within normal limits  BRAIN NATRIURETIC PEPTIDE - Abnormal; Notable for the following components:   B Natriuretic Peptide 530.6 (*)    All other components within normal limits  CBC WITH DIFFERENTIAL/PLATELET - Abnormal; Notable for the following components:   RBC 3.17 (*)    Hemoglobin 9.3 (*)    HCT 30.2 (*)    All other components within normal limits  URINALYSIS, COMPLETE (UACMP) WITH MICROSCOPIC - Abnormal; Notable for the following components:   Color, Urine AMBER (*)    APPearance CLOUDY (*)    Protein, ur 100 (*)    Leukocytes,Ua MODERATE (*)    WBC, UA >50 (*)    Bacteria, UA MANY (*)    All other components within normal limits  TROPONIN I (HIGH SENSITIVITY) - Abnormal; Notable for the following components:   Troponin I (High Sensitivity) 24 (*)    All other components within normal limits  RESP PANEL BY RT-PCR (FLU A&B, COVID) ARPGX2  URINE CULTURE  CULTURE, BLOOD (ROUTINE X 2)  CULTURE, BLOOD (ROUTINE X 2)  TROPONIN I (HIGH SENSITIVITY)    ____________________________________________  EKG: Suspected atrial flutter with ventricular rate 158.  QRS 134, QTc 514.  Nonspecific T wave changes.  No ST elevations. ________________________________________  RADIOLOGY All imaging, including plain films, CT scans, and ultrasounds, independently reviewed by me, and interpretations confirmed via formal radiology reads.  ED MD interpretation:   Chest x-ray: Interval development of cardiac enlargement, left lower airspace disease  Official radiology report(s): DG Chest Port 1 View  Result Date: 03/13/2021 CLINICAL DATA:  Short of breath and edema.  Missed dialysis. EXAM: PORTABLE CHEST 1 VIEW COMPARISON:  02/21/2021 FINDINGS: Marked cardiac enlargement with progression. Possible pericardial effusion. Vascular congestion. Negative for edema. Interval development of small to moderate left pleural effusion and left lower lobe atelectasis. Dialysis  catheter in the lower SVC unchanged. IMPRESSION: Interval development marked cardiac enlargement suggesting pericardial effusion Interval development of left pleural effusion and left lower lobe airspace disease. Electronically Signed   By: Franchot Gallo M.D.   On: 03/14/2021 14:42    ____________________________________________  PROCEDURES   Procedure(s) performed (including Critical Care):  .Critical Care Performed by: Duffy Bruce, MD Authorized by: Duffy Bruce, MD   Critical care provider statement:    Critical care time (minutes):  35   Critical care time was exclusive of:  Separately billable procedures and treating other patients and teaching time   Critical care was necessary to treat or prevent imminent or life-threatening deterioration of the following conditions:  Cardiac failure, circulatory failure and respiratory failure   Critical care was time spent personally by me on the following activities:  Development of treatment plan with patient or surrogate, discussions with consultants, evaluation of patient's response to treatment, examination of patient, obtaining history from patient or surrogate, ordering and performing treatments and interventions, ordering and review of laboratory studies, ordering and review of radiographic studies, pulse oximetry, re-evaluation of patient's condition and review of old charts   I assumed direction of critical care for this patient from another provider in my specialty: no   .Cardioversion  Date/Time: 03/06/2021 4:08 PM Performed by: Duffy Bruce, MD Authorized by: Duffy Bruce, MD   Consent:    Consent obtained:  Verbal   Consent given by:  Patient   Risks discussed:  Cutaneous burn, death, induced arrhythmia and pain   Alternatives discussed:  Rate-control medication Pre-procedure details:    Cardioversion basis:  Emergent   Rhythm:  Atrial flutter   Electrode placement:  Anterior-posterior Patient sedated: Yes. Refer  to sedation procedure documentation for details of sedation.  Attempt one:    Cardioversion mode:  Synchronous   Waveform:  Biphasic   Shock (Joules):  100   Shock outcome:  Conversion to normal sinus rhythm Post-procedure details:    Patient status:  Awake   Patient tolerance of procedure:  Tolerated well, no immediate complications    ____________________________________________  INITIAL IMPRESSION / MDM / ASSESSMENT AND PLAN / ED COURSE  As part of my medical decision making, I reviewed the following data within the [facility] notes reviewed and incorporated, Old chart reviewed, Notes from prior ED visits, and [place] Controlled Substance Database       *[name] was evaluated in Emergency Department on 03/01/2021 for the symptoms described in the history of present illness. He was evaluated in the context of the global COVID-19 pandemic, which necessitated consideration that the patient might be at risk for infection with the SARS-CoV-2 virus that causes COVID-19. Institutional protocols and algorithms that pertain to the evaluation of patients at risk for COVID-19 are in a state of rapid change based on information released by regulatory bodies including the CDC and federal and state organizations. These policies and algorithms were followed during the patient's care in the ED.  Some ED evaluations and interventions may be delayed as a result of limited staffing during the pandemic.*     Medical Decision Making: 75 year old male here with shortness of breath, weakness, and lightheadedness.  On arrival, patient noted to be in what appears to be atrial flutter versus A. fib RVR.  He is hypotensive and diaphoretic.  Patient subsequently given a low-dose of etomidate and synchronized cardioversion was performed and successful.  Blood pressure improved though remains mildly soft after cardioversion.  His lab work shows elevated BUN, creatinine, consistent  with his known ESRD and missing dialysis.  Chest x-ray shows possible mild cardiomegaly, query new pericardial effusion.  I have consulted Dr. Ubaldo Glassing with cardiology.  He is in sinus rhythm now, I have also discussed with Dr. Zollie Scale of nephrology who will see.  Patient also noted to have possible pneumonia as well as UTI.  There is possibility of an infectious component contributing to his low blood pressure and new flutter.  Will start on empiric antibiotics.  Admit to medicine.  ____________________________________________  FINAL CLINICAL IMPRESSION(S) / ED DIAGNOSES  Final diagnoses:  Atypical atrial flutter (HCC)  Generalized weakness  Acute cystitis without hematuria     MEDICATIONS GIVEN DURING THIS VISIT:  Medications  apixaban (ELIQUIS) tablet 2.5 mg (has no administration in time range)  carvedilol (COREG) tablet 6.25 mg (has no administration in time range)  clonazePAM (KLONOPIN) tablet 0.25 mg (has no administration in time range)  DULoxetine (CYMBALTA) DR capsule 120 mg (has no administration in time range)  ezetimibe (ZETIA) tablet 10 mg (has no administration in time range)  lipase/protease/amylase (CREON) capsule 72,000 Units (has no administration in time range)  mirtazapine (REMERON) tablet 45 mg (has no administration in time range)  pantoprazole (PROTONIX) EC tablet 40 mg (has no administration in time range)  rOPINIRole (REQUIP) tablet 4 mg (has no administration in time range)  rosuvastatin (CRESTOR) tablet 10 mg (has no administration in time range)  sucralfate (CARAFATE) tablet 1 g (has no administration in time range)  ceFEPIme (MAXIPIME) 1 g  in sodium chloride 0.9 % 100 mL IVPB (has no administration in time range)  vancomycin (VANCOREADY) IVPB 1750 mg/350 mL (has no administration in time range)  sodium chloride 0.9 % bolus 1,000 mL (0 mLs Intravenous Stopped 02/23/2021 1405)  etomidate (AMIDATE) injection 8 mg (8 mg Intravenous Given 02/26/2021 1349)  sodium chloride  0.9 % bolus 250 mL (250 mLs Intravenous New Bag/Given 02/19/2021 1548)     ED Discharge Orders    None       Note:  This document was prepared using Dragon voice recognition software and may include unintentional dictation errors.   Duffy Bruce, MD 03/08/2021 [phone]

## 2021-03-05 NOTE — ED Notes (Signed)
bp taken manually by Janace Hoard NT, [phone]

## 2021-03-05 NOTE — Consult Note (Signed)
Cardiology Consultation Note    Patient ID: [name], MRN: [id number], DOB/AGE: [birth year] 75 y.o. Admit date: 03/07/2021   Date of Consult: 02/27/2021 Primary Physician: Derinda Late, MD Primary Cardiologist: none  Chief Complaint: weakness Reason for Consultation: afib/hypotension Requesting MD: Dr. Nevada Crane  HPI: [name] is a 75 y.o. male with history of COPD, diabetes, hypertension, hyperlipidemia, end-stage renal disease on hemodialysis who presented to emergency room with shortness of breath and weakness.  For the last several days he has had worsening weakness.  He has been short of breath.  He underwent dialysis on Friday but had to stop early due to hypotension.  He could not go to dialysis yesterday due to weakness.  He has been fatigued and short of breath.  In the emergency room he was noted to be hypotensive and was in A. fib with RVR.  Due to rapid A. fib and hypertension he underwent cardioversion with conversion to sinus arrhythmia.  He is on carvedilol 6.25 mg twice daily and apixaban as an outpatient.  Initial EKG showed atrial fibrillation with RVR.  After cardioversion he was in sinus rhythm with right bundle branch block.  Chest x-ray revealed cardiomegaly.  Left pleural effusion.  CT of the chest on 02/21/2021 revealed a small pericardial effusion.  Past Medical History:  Diagnosis Date  . Chronic back pain   . COPD (chronic obstructive pulmonary disease) ([place])   . Depression   . Diabetes mellitus without complication ([place])   . Hyperlipidemia   . Hypertension   . Renal cell carcinoma ([place])   . Sleep apnea       Surgical History:  Past Surgical History:  Procedure Laterality Date  . CARPAL TUNNEL RELEASE    . CHOLECYSTECTOMY    . COLONOSCOPY WITH PROPOFOL N/A 07/29/2017   Procedure: COLONOSCOPY WITH PROPOFOL;  Surgeon: Manya Silvas, MD;  Location: [facility] ENDOSCOPY;  Service: Endoscopy;  Laterality: N/A;  . colonoscopys    .  DIALYSIS/PERMA CATHETER INSERTION N/A 10/09/2020   Procedure: DIALYSIS/PERMA CATHETER INSERTION;  Surgeon: Katha Cabal, MD;  Location: [facility];  Service: Cardiovascular;  Laterality: N/A;  . ESOPHAGOGASTRODUODENOSCOPY (EGD) WITH PROPOFOL N/A 07/29/2017   Procedure: ESOPHAGOGASTRODUODENOSCOPY (EGD) WITH PROPOFOL;  Surgeon: Manya Silvas, MD;  Location: [facility] ENDOSCOPY;  Service: Endoscopy;  Laterality: N/A;  . FLEXIBLE SIGMOIDOSCOPY    . GASTRIC BYPASS    . NEPHRECTOMY    . open reduction and internal fixation, right distal radius    . right knee arthroscopy    . TEMPORARY DIALYSIS CATHETER N/A 10/02/2020   Procedure: TEMPORARY DIALYSIS CATHETER;  Surgeon: Katha Cabal, MD;  Location: [facility];  Service: Cardiovascular;  Laterality: N/A;  . UPPER GI ENDOSCOPY       Home Meds: Prior to Admission medications   Medication Sig Start Date End Date Taking? Authorizing Provider  apixaban (ELIQUIS) 2.5 MG TABS tablet Take 1 tablet (2.5 mg total) by mouth 2 (two) times daily. 10/12/20  Yes Swayze, Ava, DO  glipiZIDE (GLUCOTROL XL) 10 MG 24 hr tablet Take 10 mg by mouth daily. 07/25/20  Yes [provider]  albuterol (VENTOLIN HFA) 108 (90 Base) MCG/ACT inhaler Inhale 2 puffs into the lungs every 6 (six) hours as needed for wheezing.  05/09/20   [provider]  carvedilol (COREG) 6.25 MG tablet Take 6.25 mg by mouth 2 (two) times daily with a meal.    [provider]  clonazePAM (KLONOPIN) 0.5  MG tablet Take 0.5 mg by mouth 2 (two) times daily as needed. 06/25/20   [provider]  DULoxetine (CYMBALTA) 60 MG capsule Take 120 mg by mouth daily.  09/01/19   [provider]  ezetimibe (ZETIA) 10 MG tablet Take by mouth. 11/22/20 11/22/21  [provider]  lipase/protease/amylase (CREON) 36000 UNITS CPEP capsule Take 2 capsules by mouth 3 (three) times daily with meals. 09/21/20 09/21/21  [provider]   mirtazapine (REMERON) 45 MG tablet Take 45 mg by mouth at bedtime.  07/05/20   [provider]  multivitamin (RENA-VIT) TABS tablet Take 1 tablet by mouth at bedtime. 10/12/20   Swayze, Ava, DO  Oxycodone HCl 10 MG TABS Take 1 tablet (10 mg total) by mouth 3 (three) times daily as needed. Must last 30 days. 03/04/21 04/03/21  Gillis Santa, MD  pantoprazole (PROTONIX) 40 MG tablet Take 40 mg by mouth 2 (two) times daily.     [provider]  rOPINIRole (REQUIP) 4 MG tablet Take 1 tablet (4 mg total) by mouth 2 (two) times daily before a meal. 10/12/20   Swayze, Ava, DO  rosuvastatin (CRESTOR) 10 MG tablet Take 1 tablet (10 mg total) by mouth daily. 11/09/20   Pokhrel, Corrie Mckusick, MD  sucralfate (CARAFATE) 1 g tablet Take 1 g by mouth 3 (three) times daily.     [provider]  thiamine (VITAMIN B-1) 100 MG tablet Take 100 mg by mouth daily. Patient not taking: Reported on 02/20/2021    [provider]    Inpatient Medications:  . apixaban  2.5 mg Oral BID  . carvedilol  6.25 mg Oral BID WC  . DULoxetine  120 mg Oral Daily  . ezetimibe  10 mg Oral Daily  . lipase/protease/amylase  72,000 Units Oral TID WC  . mirtazapine  45 mg Oral QHS  . pantoprazole  40 mg Oral BID  . rOPINIRole  4 mg Oral BID AC  . rosuvastatin  10 mg Oral Daily  . sucralfate  1 g Oral TID   . sodium chloride      Allergies:  Allergies  Allergen Reactions  . Bupropion     Other reaction(s): Other (See Comments) Mood swings, angry Other reaction(s): Other (See Comments) Mood swings, angry    Social History   Socioeconomic History  . Marital status: Married    Spouse name: Jackelyn Poling  . Number of children: Not on file  . Years of education: Not on file  . Highest education level: Not on file  Occupational History  . Occupation: Retired    Comment: VP of [employer]  Tobacco Use  . Smoking status: Former Smoker    Packs/day: 1.00    Years: 30.00    Pack years: 30.00  .  Smokeless tobacco: Current User    Types: Chew  Vaping Use  . Vaping Use: Never used  Substance and Sexual Activity  . Alcohol use: No  . Drug use: No  . Sexual activity: Yes  Other Topics Concern  . Not on file  Social History Narrative  . Not on file   Social Determinants of Health   Financial Resource Strain: Not on file  Food Insecurity: Not on file  Transportation Needs: Not on file  Physical Activity: Not on file  Stress: Not on file  Social Connections: Not on file  Intimate Partner Violence: Not on file     Family History  Problem Relation Age of Onset  . Diabetes Mother   .  Cancer Mother   . Heart disease Father      Review of Systems: A 12-system review of systems was performed and is negative except as noted in the HPI.  Labs: No results for input(s): CKTOTAL, CKMB, TROPONINI in the last 72 hours. Lab Results  Component Value Date   WBC 8.1 03/11/2021   HGB 9.3 (L) 03/03/2021   HCT 30.2 (L) 02/17/2021   MCV 95.3 03/06/2021   PLT 300 03/14/2021    Recent Labs  Lab 02/13/2021 1325  NA 137  K 3.7  CL 96*  CO2 25  BUN 64*  CREATININE 6.23*  CALCIUM 7.0*  PROT 6.4*  BILITOT 0.9  ALKPHOS 88  ALT 110*  AST 142*  GLUCOSE 204*   Lab Results  Component Value Date   CHOL 136 11/08/2020   HDL 29 (L) 11/08/2020   LDLCALC 77 11/08/2020   TRIG 149 11/08/2020   Lab Results  Component Value Date   DDIMER 0.85 (H) 02/21/2021    Radiology/Studies:  DG Chest 2 View  Result Date: 02/21/2021 CLINICAL DATA:  Chest pain EXAM: CHEST - 2 VIEW COMPARISON:  October 07, 2020 FINDINGS: Central catheter tip in superior vena cava near the cavoatrial junction. No pneumothorax. There is left base atelectasis. Lungs elsewhere are clear. Heart is slightly enlarged with pulmonary vascularity normal. No adenopathy. No bone lesions. IMPRESSION: Central catheter tip in superior vena cava near the cavoatrial junction. No pneumothorax. Left base atelectasis. No edema or  airspace opacity. Mild cardiac prominence. Electronically Signed   By: Lowella Grip III M.D.   On: 02/21/2021 13:49   CT Angio Chest PE W and/or Wo Contrast  Result Date: 02/21/2021 CLINICAL DATA:  Right upper chest pain post dialysis catheter flushing last night. EXAM: CT ANGIOGRAPHY CHEST WITH CONTRAST TECHNIQUE: Multidetector CT imaging of the chest was performed using the standard protocol during bolus administration of intravenous contrast. Multiplanar CT image reconstructions and MIPs were obtained to evaluate the vascular anatomy. CONTRAST:  32mL OMNIPAQUE IOHEXOL 350 MG/ML SOLN COMPARISON:  Chest radiograph from earlier today. 01/28/2014 chest CT. FINDINGS: Cardiovascular: The study is high quality for the evaluation of pulmonary embolism. There are no filling defects in the central, lobar, segmental or subsegmental pulmonary artery branches to suggest acute pulmonary embolism. Normal course and caliber of the thoracic aorta. Dilated main pulmonary artery (3.8 cm diameter). Normal heart size. Small pericardial effusion. Three-vessel coronary atherosclerosis. Right internal jugular central venous catheter terminates in the lower third of the SVC. Mediastinum/Nodes: No discrete thyroid nodules. Unremarkable esophagus. No pathologically enlarged axillary, mediastinal or hilar lymph nodes. Lungs/Pleura: No pneumothorax. No pleural effusion. No acute consolidative airspace disease, lung masses or significant pulmonary nodules. Platelike parenchymal bands at the left greater than right lung bases compatible with scarring or atelectasis. 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 Musculoskeletal: No aggressive appearing focal osseous lesions. Mild thoracic spondylosis. Review of the MIP images confirms the above findings. IMPRESSION: 1. No pulmonary embolism. 2. Dilated main pulmonary artery, suggesting pulmonary arterial hypertension. 3. Small  pericardial effusion. 4. Three-vessel coronary atherosclerosis. 5. Platelike parenchymal bands at the left greater than right lung bases, compatible with scarring or atelectasis. Electronically Signed   By: Ilona Sorrel M.D.   On: 02/21/2021 15:45   DG Chest Port 1 View  Result Date: 02/24/2021 CLINICAL DATA:  Short of breath and edema.  Missed dialysis. EXAM: PORTABLE CHEST 1 VIEW COMPARISON:  02/21/2021 FINDINGS: Marked cardiac enlargement with progression. Possible pericardial  effusion. Vascular congestion. Negative for edema. Interval development of small to moderate left pleural effusion and left lower lobe atelectasis. Dialysis catheter in the lower SVC unchanged. IMPRESSION: Interval development marked cardiac enlargement suggesting pericardial effusion Interval development of left pleural effusion and left lower lobe airspace disease. Electronically Signed   By: Franchot Gallo M.D.   On: 02/13/2021 14:42    Wt Readings from Last 3 Encounters:  03/13/2021 113.4 kg  02/21/21 113.4 kg  02/14/21 115.8 kg    EKG: Atrial fibrillation with RVR.  After cardioversion-sinus rhythm with right bundle branch block.  Physical Exam:  Blood pressure (!) 118/98, pulse 100, temperature 98.7 F (37.1 C), temperature source Oral, resp. rate 20, height 6' (1.829 m), weight 113.4 kg, SpO2 96 %. Body mass index is 33.91 kg/m. General: Well developed, chronically ill appearing. Head: Normocephalic, atraumatic, sclera non-icteric, no xanthomas, nares are without discharge.  Neck: Negative for carotid bruits. JVD not elevated. Lungs: Clear bilaterally to auscultation without wheezes, rales, or rhonchi. Breathing is unlabored. Heart: RRR with S1 S2. No murmurs, rubs, or gallops appreciated. Abdomen: Soft, non-tender, non-distended with normoactive bowel sounds. No hepatomegaly. No rebound/guarding. No obvious abdominal masses. Msk:  Strength and tone appear normal for age. Extremities: No clubbing or cyanosis.  No edema.  Distal pedal pulses are 2+ and equal bilaterally. Neuro: Alert and oriented X 3. No facial asymmetry. No focal deficit. Moves all extremities spontaneously.      Assessment and Plan  75 year old male with history of end-stage renal disease on hemodialysis who presented to emergency room with complaints of weakness fatigue and shortness of breath.  He was noted to be somewhat hypotensive and in A. fib with RVR.  He converted to sinus rhythm after cardioversion in the emergency room.  He had been on carvedilol and Eliquis prior to presentation.  He is currently in sinus rhythm and he feels better than when he presented.  He denies syncope.  1.  Hypotension-etiology unclear.  Patient had a small pericardial effusion several weeks ago by CT.  Chest x-ray suggest cardiomegaly.  Will await echocardiogram to guide further therapy regarding his pericardium.  Would continue with careful hydration.  Cultures pending.  Empiric antibiotics in place.  2.  Atrial fibrillation with RVR-converted to sinus rhythm.  Currently in sinus rhythm on carvedilol.  Blood pressure does not allow more aggressive treatment at present.  Would continue with Eliquis and follow.  3.  End-stage renal disease-continue with hemodialysis.  Signed, Teodoro Spray MD 02/13/2021, 3:45 PM Pager: [phone]

## 2021-03-05 NOTE — ED Notes (Signed)
In and out performed with Rinaldo Cloud student RN. Bladder drained with less than 68mL noted of urine. Dark yellow. Pt states he has been making very little urine for past few days. Will notify Dr Ellender Hose

## 2021-03-05 NOTE — Sedation Documentation (Signed)
Dr Ellender Hose aware of no ETCO2 monitoring at this time, Dr Ellender Hose at bedside and monitoring pt

## 2021-03-05 NOTE — Consult Note (Signed)
CODE SEPSIS - PHARMACY COMMUNICATION  **Broad Spectrum Antibiotics should be administered within 1 hour of Sepsis diagnosis**  Time Code Sepsis Called/Page Received: 1457  Antibiotics Ordered: 1547  Time of 1st antibiotic administration: 1616  Additional action taken by pharmacy: Notified RN about orders, starting with cefepime & will tube up vanc dose to follow.  If necessary, Name of Provider/Nurse Contacted: RN Orlando ,PharmD Clinical Pharmacist  03/08/2021  3:48 PM

## 2021-03-05 NOTE — Consult Note (Signed)
PHARMACY -  BRIEF ANTIBIOTIC NOTE   Pharmacy has received consult(s) for Vancomycin & Cefepime from an ED provider.  The patient's profile has been reviewed for ht/wt/allergies/indication/available labs.    One time order(s) placed for: MWF schedule in [facility]. Treating for HCAP. Vancomycin 1.75g x1 Cefepime 1g x1  Further antibiotics/pharmacy consults should be ordered by admitting physician if indicated.                       Thank you, Lorna Dibble 02/12/2021  3:41 PM

## 2021-03-05 NOTE — ED Notes (Addendum)
Pt denies cp 98% on 6L, taken off NRB

## 2021-03-05 NOTE — ED Notes (Signed)
Pt most recent blood pressure 85/58. William Hamburger, MD notified and told this RN to givwe 27mls NS bolus.

## 2021-03-05 NOTE — ED Notes (Signed)
ambubag set up and at bedside, suction set up, pt on pads, code cart at bedside, MD Isaacs at bedside, pt currently on 5L [place]

## 2021-03-05 NOTE — ED Notes (Signed)
Pt alert, answering questions appropriately, RR even and unlabored, able to move all extremities.

## 2021-03-06 ENCOUNTER — Inpatient Hospital Stay
Admit: 2021-03-06 | Discharge: 2021-03-06 | Disposition: A | Discharge status: Home / Self Care | Payer: Medicare Other | Attending: Internal Medicine | Admitting: Internal Medicine

## 2021-03-06 ENCOUNTER — Inpatient Hospital Stay: Payer: Medicare Other

## 2021-03-06 DIAGNOSIS — N3000 Acute cystitis without hematuria: Secondary | ICD-10-CM | POA: Diagnosis not present

## 2021-03-06 DIAGNOSIS — J9621 Acute and chronic respiratory failure with hypoxia: Secondary | ICD-10-CM

## 2021-03-06 LAB — CBC WITH DIFFERENTIAL/PLATELET
Abs Immature Granulocytes: 0.04 10*3/uL (ref 0.00–0.07)
Abs Immature Granulocytes: 0.03 10*3/uL (ref 0.00–0.07)
Basophils Absolute: 0 10*3/uL (ref 0.0–0.1)
Basophils Absolute: 0 10*3/uL (ref 0.0–0.1)
Basophils Relative: 0 %
Basophils Relative: 1 %
Eosinophils Absolute: 0.1 10*3/uL (ref 0.0–0.5)
Eosinophils Absolute: 0.1 10*3/uL (ref 0.0–0.5)
Eosinophils Relative: 1 %
Eosinophils Relative: 2 %
HCT: 30.8 % — ABNORMAL LOW (ref 39.0–52.0)
HCT: 31.2 % — ABNORMAL LOW (ref 39.0–52.0)
Hemoglobin: 9.3 g/dL — ABNORMAL LOW (ref 13.0–17.0)
Hemoglobin: 9.4 g/dL — ABNORMAL LOW (ref 13.0–17.0)
Immature Granulocytes: 0 %
Immature Granulocytes: 0 %
Lymphocytes Relative: 11 %
Lymphocytes Relative: 12 %
Lymphs Abs: 1 10*3/uL (ref 0.7–4.0)
Lymphs Abs: 0.9 10*3/uL (ref 0.7–4.0)
MCH: 29.4 pg (ref 26.0–34.0)
MCH: 29.4 pg (ref 26.0–34.0)
MCHC: 30.2 g/dL (ref 30.0–36.0)
MCHC: 30.1 g/dL (ref 30.0–36.0)
MCV: 97.5 fL (ref 80.0–100.0)
MCV: 97.5 fL (ref 80.0–100.0)
Monocytes Absolute: 0.6 10*3/uL (ref 0.1–1.0)
Monocytes Absolute: 0.5 10*3/uL (ref 0.1–1.0)
Monocytes Relative: 7 %
Monocytes Relative: 7 %
Neutro Abs: 7.4 10*3/uL (ref 1.7–7.7)
Neutro Abs: 5.9 10*3/uL (ref 1.7–7.7)
Neutrophils Relative %: 81 %
Neutrophils Relative %: 78 %
Platelets: 295 10*3/uL (ref 150–400)
Platelets: 279 10*3/uL (ref 150–400)
RBC: 3.16 MIL/uL — ABNORMAL LOW (ref 4.22–5.81)
RBC: 3.2 MIL/uL — ABNORMAL LOW (ref 4.22–5.81)
RDW: 15.3 % (ref 11.5–15.5)
RDW: 15.4 % (ref 11.5–15.5)
WBC: 9.2 10*3/uL (ref 4.0–10.5)
WBC: 7.4 10*3/uL (ref 4.0–10.5)
nRBC: 0 % (ref 0.0–0.2)
nRBC: 0.3 % — ABNORMAL HIGH (ref 0.0–0.2)

## 2021-03-06 LAB — MRSA PCR SCREENING: MRSA by PCR: NEGATIVE

## 2021-03-06 LAB — ECHOCARDIOGRAM COMPLETE
AR max vel: 4.54 cm2
AV Area VTI: 6.81 cm2
AV Area mean vel: 4.31 cm2
AV Mean grad: 2 mmHg
AV Peak grad: 3.6 mmHg
Ao pk vel: 0.95 m/s
Area-P 1/2: 2.99 cm2
Height: 72 in
S' Lateral: 2.5 cm
Weight: 4151.7 oz

## 2021-03-06 LAB — COMPREHENSIVE METABOLIC PANEL
ALT: 98 U/L — ABNORMAL HIGH (ref 0–44)
AST: 74 U/L — ABNORMAL HIGH (ref 15–41)
Albumin: 3.1 g/dL — ABNORMAL LOW (ref 3.5–5.0)
Alkaline Phosphatase: 83 U/L (ref 38–126)
Anion gap: 16 — ABNORMAL HIGH (ref 5–15)
BUN: 72 mg/dL — ABNORMAL HIGH (ref 8–23)
CO2: 25 mmol/L (ref 22–32)
Calcium: 6.7 mg/dL — ABNORMAL LOW (ref 8.9–10.3)
Chloride: 97 mmol/L — ABNORMAL LOW (ref 98–111)
Creatinine, Ser: 7.12 mg/dL — ABNORMAL HIGH (ref 0.61–1.24)
GFR, Estimated: 8 mL/min — ABNORMAL LOW (ref >60)
Glucose, Bld: 111 mg/dL — ABNORMAL HIGH (ref 70–99)
Potassium: 4 mmol/L (ref 3.5–5.1)
Sodium: 138 mmol/L (ref 135–145)
Total Bilirubin: 0.9 mg/dL (ref 0.3–1.2)
Total Protein: 6.6 g/dL (ref 6.5–8.1)

## 2021-03-06 LAB — GLUCOSE, CAPILLARY: Glucose-Capillary: 93 mg/dL (ref 70–99)

## 2021-03-06 LAB — LACTIC ACID, PLASMA: Lactic Acid, Venous: 2.2 mmol/L (ref 0.5–1.9)

## 2021-03-06 LAB — PROCALCITONIN: Procalcitonin: 1.05 ng/mL

## 2021-03-06 LAB — HEMOGLOBIN A1C
Hgb A1c MFr Bld: 5.7 % — ABNORMAL HIGH (ref 4.8–5.6)
Mean Plasma Glucose: 116.89 mg/dL

## 2021-03-06 LAB — TSH: TSH: 3.513 u[IU]/mL (ref 0.350–4.500)

## 2021-03-06 LAB — PHOSPHORUS: Phosphorus: 9 mg/dL — ABNORMAL HIGH (ref 2.5–4.6)

## 2021-03-06 LAB — T4, FREE: Free T4: 0.81 ng/dL (ref 0.61–1.12)

## 2021-03-06 LAB — MAGNESIUM: Magnesium: 1.8 mg/dL (ref 1.7–2.4)

## 2021-03-06 MED ORDER — NOREPINEPHRINE 4 MG/250ML-% IV SOLN: 2.0000 ug/min | INTRAVENOUS | Status: DC

## 2021-03-06 MED ORDER — EPOETIN ALFA 10000 UNIT/ML IJ SOLN
4000.0000 [IU] | INTRAMUSCULAR | Status: DC
  Administered 2021-03-06 – 2021-03-08 (×2): 4000 [IU] via INTRAVENOUS
  Filled 2021-03-06: qty 1

## 2021-03-06 MED ORDER — AMIODARONE LOAD VIA INFUSION
150.0000 mg | Freq: Once | INTRAVENOUS | Status: AC
  Administered 2021-03-06: 150 mg via INTRAVENOUS
  Filled 2021-03-06: qty 83.34

## 2021-03-06 MED ORDER — HEPARIN SODIUM (PORCINE) 1000 UNIT/ML DIALYSIS: 1000.0000 [IU] | INTRAMUSCULAR | Status: DC | PRN

## 2021-03-06 MED ORDER — SODIUM CHLORIDE 0.9 % IV SOLN
250.0000 mL | INTRAVENOUS | Status: DC
  Administered 2021-03-06: 250 mL via INTRAVENOUS

## 2021-03-06 MED ORDER — NOREPINEPHRINE 4 MG/250ML-% IV SOLN
INTRAVENOUS | Status: AC
  Administered 2021-03-06: 2 ug/min via INTRAVENOUS
  Filled 2021-03-06: qty 250

## 2021-03-06 MED ORDER — ALBUMIN HUMAN 25 % IV SOLN
25.0000 g | INTRAVENOUS | Status: AC
  Administered 2021-03-06: 25 g via INTRAVENOUS
  Filled 2021-03-06: qty 100

## 2021-03-06 MED ORDER — SODIUM CHLORIDE 0.9 % IV SOLN: 250.0000 mL | INTRAVENOUS | Status: DC

## 2021-03-06 MED ORDER — CARVEDILOL 6.25 MG PO TABS: 3.1250 mg | ORAL_TABLET | Freq: Two times a day (BID) | ORAL | Status: DC

## 2021-03-06 MED ORDER — VANCOMYCIN HCL 1000 MG/200ML IV SOLN: 1000.0000 mg | Freq: Once | INTRAVENOUS | Status: DC

## 2021-03-06 MED ORDER — VANCOMYCIN HCL 1500 MG/300ML IV SOLN: 1500.0000 mg | Freq: Once | INTRAVENOUS | Status: DC

## 2021-03-06 MED ORDER — LIDOCAINE-PRILOCAINE 2.5-2.5 % EX CREA
1.0000 "application " | TOPICAL_CREAM | CUTANEOUS | Status: DC | PRN
  Filled 2021-03-06: qty 5

## 2021-03-06 MED ORDER — PIPERACILLIN-TAZOBACTAM IN DEX 2-0.25 GM/50ML IV SOLN
2.2500 g | Freq: Three times a day (TID) | INTRAVENOUS | Status: AC
  Administered 2021-03-06 – 2021-03-08 (×6): 2.25 g via INTRAVENOUS
  Filled 2021-03-06 (×8): qty 50

## 2021-03-06 MED ORDER — PENTAFLUOROPROP-TETRAFLUOROETH EX AERO
1.0000 "application " | INHALATION_SPRAY | CUTANEOUS | Status: DC | PRN
  Filled 2021-03-06: qty 30

## 2021-03-06 MED ORDER — MIDODRINE HCL 5 MG PO TABS
5.0000 mg | ORAL_TABLET | ORAL | Status: AC
  Administered 2021-03-06: 5 mg via ORAL
  Filled 2021-03-06: qty 1

## 2021-03-06 MED ORDER — SODIUM CHLORIDE 0.9 % IV SOLN: 100.0000 mL | INTRAVENOUS | Status: DC | PRN

## 2021-03-06 MED ORDER — AMIODARONE HCL IN DEXTROSE 360-4.14 MG/200ML-% IV SOLN
60.0000 mg/h | INTRAVENOUS | Status: AC
  Administered 2021-03-06: 60 mg/h via INTRAVENOUS
  Filled 2021-03-06: qty 200

## 2021-03-06 MED ORDER — ORAL CARE MOUTH RINSE
15.0000 mL | Freq: Two times a day (BID) | OROMUCOSAL | Status: DC
  Administered 2021-03-06 – 2021-03-09 (×7): 15 mL via OROMUCOSAL

## 2021-03-06 MED ORDER — ALTEPLASE 2 MG IJ SOLR: 2.0000 mg | Freq: Once | INTRAMUSCULAR | Status: DC | PRN

## 2021-03-06 MED ORDER — MIDODRINE HCL 5 MG PO TABS
10.0000 mg | ORAL_TABLET | ORAL | Status: DC
  Administered 2021-03-06 – 2021-03-08 (×2): 10 mg via ORAL
  Filled 2021-03-06 (×2): qty 2

## 2021-03-06 MED ORDER — CHLORHEXIDINE GLUCONATE CLOTH 2 % EX PADS
6.0000 | MEDICATED_PAD | Freq: Every day | CUTANEOUS | Status: DC
  Administered 2021-03-06 – 2021-03-11 (×6): 6 via TOPICAL

## 2021-03-06 MED ORDER — LIDOCAINE HCL (PF) 1 % IJ SOLN
5.0000 mL | INTRAMUSCULAR | Status: DC | PRN
  Filled 2021-03-06: qty 5

## 2021-03-06 MED ORDER — CHLORHEXIDINE GLUCONATE CLOTH 2 % EX PADS: 6.0000 | MEDICATED_PAD | Freq: Every day | CUTANEOUS | Status: DC

## 2021-03-06 MED ORDER — AMIODARONE HCL IN DEXTROSE 360-4.14 MG/200ML-% IV SOLN
30.0000 mg/h | INTRAVENOUS | Status: DC
  Administered 2021-03-07: 60 mg/h via INTRAVENOUS
  Administered 2021-03-08 – 2021-03-12 (×9): 30 mg/h via INTRAVENOUS
  Filled 2021-03-06 (×13): qty 200

## 2021-03-06 NOTE — Progress Notes (Signed)
Secure chat to MD, patients current BP 92/70. Patient asymptomatic. No new orders obtained.

## 2021-03-06 NOTE — Progress Notes (Incomplete)
Cross Cover Patient with wide complex tachycardia, 147, and hypotension post hemodialysis. A rapid response was called by nursing staff but I was on unit and was evaluating patient prior to Rapid response nurse arrival.  I was not paged. Bedside patient denies discomfort and has no increase work of breathing.  Reports hypotension began at home over the weekend and at that time stopped taking his coreg.  He also reported to rapid response nurse that hypotension began after HD catheter flushed at dialysis center over the weekend.  Prior chest xray with possible  pericarial efffusion and new left pleural effusion and atelectasis. Vascuclar congestion without pulmonary edema also present.  Sepsis   Urinalysis showed many leukocytes and bacteria, negative for nitrite, however, is concerning for possible infection source. Culture is pending  Blood cultures still pending.  Repeating CBC, chest xray (new left pleural effusion concerning for early pneumonia) procalcitonin and lactic acid. After discussion with pharmacist have changed antibiotics to zosyn. He was given dose of albumin for hypotension, low threshold for initiating pressors at this point  Discussed with Dr. Ubaldo Glassing possibility of amiodarone for rate control.  He recommends amio bolus 150 mg followed by standard infusion.   Patient being transferred to stepdown for closer monitoring given his history and high potential for decompensation  Secondary to ongoing hypotension, levophed initiated and Critical care NP consulted.

## 2021-03-06 NOTE — Progress Notes (Signed)
Rapid Response Event Note   Reason for Call :   - Hypotension, tachycardia  Initial Focused Assessment:   - Patient is lying in bed, AA+Ox4, no pain - No obvious resp distress - HR in 140s sustained; BP 80s/50s  Interventions:   - CBG - Full set VS - NP paged to bedside - 12-lead EKG obtained - 2nd PIV inserted  Plan of Care:   - Transfer to SDU for further work-up   Event Summary:   MD Notified: Sharion Settler, NP Call Time: 1914 hrs Arrival Time: 1916 hrs End Time: 1930 hrs  Jesse Sans, RN

## 2021-03-06 NOTE — Progress Notes (Signed)
PROGRESS NOTE  [name] YOV:[id number] DOB: 14-Sep-1946 DOA: 03/13/2021 PCP: Derinda Late, MD  HPI/Recap of past 24 hours:   [name] is a 75 y.o. male with medical history significant for ESRD on HD MWF, history of DVT on Eliquis, essential hypertension, chronic anxiety/depression, type 2 diabetes, hyperlipidemia, who presented to [facility] due to gradually worsening shortness of breath, generalized weakness with onset on Friday 5 days ago and worsened over the weekend.  Noted that he stopped making urine in the last 2 days.  Positive orthopnea.  Denies chest pain.  Denies any GI symptoms.  On presentation to the ED, he was found to be in A-flutter with a rate in the 160s, BP 77/60, he was cardioverted in the ED to sinus rhythm.  BP improved.  Urine analysis was positive for pyuria.  CXR could not rule out pneumonia, showing possible pericardial effusion.  Due to concern for UTI and HCAP, patient was started on Cefepime and IV vancomycin empirically in the ED.  EDP has consulted nephrology for hemodialysis.  Cardiology has also been consulted due to recent A-flutter and possible pericardial effusion on CXR.  ED Course:  Post cardioversion, BP 87/59, pulse 94, respiratory 25, O2 saturation 98% on 5L.  Lab studies remarkable for serum sodium 137, glucose 204, BUN 64, creatinine 6.2, anion gap 16.  AST 142, ALT 110, BNP 530, troponin 24.  WBC 8.1, hemoglobin 9.3, platelets 300.  03/06/21: Seen and examined at his bedside.  Reports dyspnea has improved.  However he has not been out of bed since he was admitted.  Small to moderate pericardial effusion seen on 2D echo.  No evidence of tamponade.  Cardiology is following.   Assessment/Plan: Active Problems:   UTI (urinary tract infection)  Generalized weakness, thought to be secondary to UTI versus HCAP, POA. Presented with generalized weakness of 5 days duration acutely worsening over the weekend. States he stopped making  urine in the last 2 days.  Not usually anuric. Urinalysis positive for pyuria Chest x-ray with concern for pulmonary infiltrate and possible pericardial effusion Started on cefepime and IV vancomycin empirically in the ED. Follow urine culture and blood cultures Currently afebrile no leukocytosis. PT OT to assess Orthostatic vital signs Fall precautions.  A-flutter, post cardioversion on 02/27/2021 in the ED. Continue home Eliquis which he was taking for history of DVT. 2D echo Cardiology, Dr. Ubaldo Glassing, following. Continue home Coreg to avoid beta-blockade withdrawal.  Small to moderate pericardial effusion seen on chest x-ray and confirmed on 2D echo with no evidence of tamponade Monitor Seen by cardiology  ESRD on HD MWF Nephrology consulted to resume hemodialysis Hemodialysis planned today.  Hypotension  Maintain MAP greater than 65. Continue midodrine on hemodialysis days, Monday Wednesday Friday. Continue to closely monitor vital signs.  Impaired glucose tolerance Hemoglobin A1c 5.7 on 03/06/2021. Hold off home oral hypoglycemics Insulin sensitive.  Hyperlipidemia/chronic anxiety/restless leg syndrome Resume home regimen.    DVT prophylaxis: Eliquis.  Code Status: Full code, per the patient.   Family Communication: None at bedside.  Disposition Plan: Admit to MedSurg unit with remote telemetry.  Consults called: Cardiology, nephrology.  Admission status: Inpatient status.  Patient will require at least 2 midnights for further evaluation and treatment of present condition.  Status is: Inpatient   Dispo:             Patient From: Home             Planned Disposition: Home  Anticipated date  of discharge 03/11/2021 or when cardiology and nephrology sign off.             Medically stable for discharge: No                    Status is: Inpatient  Dispo:  Patient From: Home  Planned Disposition: Home  Medically stable for discharge: No           Objective: Vitals:   03/06/21 0928 03/06/21 1130 03/06/21 1144 03/06/21 1334  BP:  (!) 100/53  92/70  Pulse: 96 87  87  Resp:  (!) 26 (!) 27 (!) 25  Temp:  98.2 F (36.8 C)  98.1 F (36.7 C)  TempSrc:  Oral  Oral  SpO2: 98% 98%  96%  Weight:      Height:        Intake/Output Summary (Last 24 hours) at 03/06/2021 1342 Last data filed at 03/06/2021 1021 Gross per 24 hour  Intake 980 ml  Output --  Net 980 ml   Filed Weights   03/08/2021 1326 03/06/21 0621  Weight: 113.4 kg 117.7 kg    Exam:  . General: 75 y.o. year-old male well developed well nourished in no acute distress.  Alert and oriented x3. . Cardiovascular: Regular rate and rhythm with no rubs or gallops.  No thyromegaly or JVD noted.   Marland Kitchen Respiratory: Mild rales at bases with no wheezing noted.  Poor inspiratory effort.   . Abdomen: Soft nontender nondistended with normal bowel sounds x4 quadrants. . Musculoskeletal: No lower extremity edema. 2/4 pulses in all 4 extremities. . Skin: No ulcerative lesions noted or rashes, . Psychiatry: Mood is appropriate for condition and setting   Data Reviewed: CBC: Recent Labs  Lab 02/12/2021 1325 03/06/21 0548  WBC 8.1 9.2  NEUTROABS 6.7 7.4  HGB 9.3* 9.3*  HCT 30.2* 30.8*  MCV 95.3 97.5  PLT 300 962   Basic Metabolic Panel: Recent Labs  Lab 02/17/2021 1325 03/06/21 0548  NA 137 138  K 3.7 4.0  CL 96* 97*  CO2 25 25  GLUCOSE 204* 111*  BUN 64* 72*  CREATININE 6.23* 7.12*  CALCIUM 7.0* 6.7*  MG  --  1.8  PHOS  --  9.0*   GFR: Estimated Creatinine Clearance: 12.4 mL/min (A) (by C-G formula based on SCr of 7.12 mg/dL (H)). Liver Function Tests: Recent Labs  Lab 03/08/2021 1325 03/06/21 0548  AST 142* 74*  ALT 110* 98*  ALKPHOS 88 83  BILITOT 0.9 0.9  PROT 6.4* 6.6  ALBUMIN 3.0* 3.1*   No results for input(s): LIPASE, AMYLASE in the last 168 hours. No results for input(s): AMMONIA in the last 168 hours. Coagulation Profile: No results for  input(s): INR, PROTIME in the last 168 hours. Cardiac Enzymes: No results for input(s): CKTOTAL, CKMB, CKMBINDEX, TROPONINI in the last 168 hours. BNP (last 3 results) No results for input(s): PROBNP in the last 8760 hours. HbA1C: Recent Labs    03/06/21 0548  HGBA1C 5.7*   CBG: Recent Labs  Lab 02/17/2021 2004  GLUCAP 92   Lipid Profile: No results for input(s): CHOL, HDL, LDLCALC, TRIG, CHOLHDL, LDLDIRECT in the last 72 hours. Thyroid Function Tests: No results for input(s): TSH, T4TOTAL, FREET4, T3FREE, THYROIDAB in the last 72 hours. Anemia Panel: No results for input(s): VITAMINB12, FOLATE, FERRITIN, TIBC, IRON, RETICCTPCT in the last 72 hours. Urine analysis:    Component Value Date/Time   COLORURINE AMBER (A) 02/19/2021 1451   APPEARANCEUR  CLOUDY (A) 03/01/2021 1451   APPEARANCEUR Clear 06/22/2014 2019   LABSPEC 1.018 03/03/2021 1451   LABSPEC 1.015 06/22/2014 2019   PHURINE 5.0 03/11/2021 1451   GLUCOSEU NEGATIVE 03/08/2021 1451   GLUCOSEU Negative 06/22/2014 2019   HGBUR NEGATIVE 03/02/2021 1451   BILIRUBINUR NEGATIVE 03/14/2021 1451   BILIRUBINUR Negative 06/22/2014 2019   KETONESUR NEGATIVE 03/13/2021 1451   PROTEINUR 100 (A) 03/11/2021 1451   NITRITE NEGATIVE 03/08/2021 1451   LEUKOCYTESUR MODERATE (A) 02/18/2021 1451   LEUKOCYTESUR Negative 06/22/2014 2019   Sepsis Labs: @LABRCNTIP (procalcitonin:4,lacticidven:4)  ) Recent Results (from the past 240 hour(s))  Resp Panel by RT-PCR (Flu A&B, Covid) Nasopharyngeal Swab     Status: None   Collection Time: 03/01/2021  3:45 PM   Specimen: Nasopharyngeal Swab; Nasopharyngeal(NP) swabs in vial transport medium  Result Value Ref Range Status   SARS Coronavirus 2 by RT PCR NEGATIVE NEGATIVE Final    Comment: (NOTE) SARS-CoV-2 target nucleic acids are NOT DETECTED.  The SARS-CoV-2 RNA is generally detectable in upper respiratory specimens during the acute phase of infection. The lowest concentration of  SARS-CoV-2 viral copies this assay can detect is 138 copies/mL. A negative result does not preclude SARS-Cov-2 infection and should not be used as the sole basis for treatment or other patient management decisions. A negative result may occur with  improper specimen collection/handling, submission of specimen other than nasopharyngeal swab, presence of viral mutation(s) within the areas targeted by this assay, and inadequate number of viral copies(<138 copies/mL). A negative result must be combined with clinical observations, patient history, and epidemiological information. The expected result is Negative.  Fact Sheet for Patients:  EntrepreneurPulse.com.au  Fact Sheet for Healthcare Providers:  IncredibleEmployment.be  This test is no t yet approved or cleared by the [place] FDA and  has been authorized for detection and/or diagnosis of SARS-CoV-2 by FDA under an Emergency Use Authorization (EUA). This EUA will remain  in effect (meaning this test can be used) for the duration of the COVID-19 declaration under Section 564(b)(1) of the Act, 21 U.S.C.section 360bbb-3(b)(1), unless the authorization is terminated  or revoked sooner.       Influenza A by PCR NEGATIVE NEGATIVE Final   Influenza B by PCR NEGATIVE NEGATIVE Final    Comment: (NOTE) The Xpert Xpress SARS-CoV-2/FLU/RSV plus assay is intended as an aid in the diagnosis of influenza from Nasopharyngeal swab specimens and should not be used as a sole basis for treatment. Nasal washings and aspirates are unacceptable for Xpert Xpress SARS-CoV-2/FLU/RSV testing.  Fact Sheet for Patients: EntrepreneurPulse.com.au  Fact Sheet for Healthcare Providers: IncredibleEmployment.be  This test is not yet approved or cleared by the [place] FDA and has been authorized for detection and/or diagnosis of SARS-CoV-2 by FDA under an Emergency Use  Authorization (EUA). This EUA will remain in effect (meaning this test can be used) for the duration of the COVID-19 declaration under Section 564(b)(1) of the Act, 21 U.S.C. section 360bbb-3(b)(1), unless the authorization is terminated or revoked.  Performed at [facility], [address] [place], [place] [address]   CULTURE, BLOOD (ROUTINE X 2) w Reflex to ID Panel     Status: None (Preliminary result)   Collection Time: 03/01/2021  6:17 PM   Specimen: BLOOD  Result Value Ref Range Status   Specimen Description BLOOD BLOOD RIGHT HAND  Final   Special Requests   Final    BOTTLES DRAWN AEROBIC AND ANAEROBIC Blood Culture adequate volume   Culture  Final    NO GROWTH < 12 HOURS Performed at [facility], [address] [place], [place] [address]    Report Status PENDING  Incomplete  CULTURE, BLOOD (ROUTINE X 2) w Reflex to ID Panel     Status: None (Preliminary result)   Collection Time: 03/06/2021  6:18 PM   Specimen: BLOOD  Result Value Ref Range Status   Specimen Description BLOOD RIGHT ANTECUBITAL  Final   Special Requests   Final    BOTTLES DRAWN AEROBIC AND ANAEROBIC Blood Culture adequate volume   Culture   Final    NO GROWTH < 12 HOURS Performed at [facility], [address], [place], [place] [address]    Report Status PENDING  Incomplete  MRSA PCR Screening     Status: None   Collection Time: 03/06/21  9:20 AM   Specimen: Nasopharyngeal  Result Value Ref Range Status   MRSA by PCR NEGATIVE NEGATIVE Final    Comment:        The GeneXpert MRSA Assay (FDA approved for NASAL specimens only), is one component of a comprehensive MRSA colonization surveillance program. It is not intended to diagnose MRSA infection nor to guide or monitor treatment for MRSA infections. Performed at [facility], [address] [place], [place] [address]       Studies: [facility] [place] 1 View  Result Date: 03/07/2021 CLINICAL DATA:  Short  of breath and edema.  Missed dialysis. EXAM: PORTABLE CHEST 1 VIEW COMPARISON:  02/21/2021 FINDINGS: Marked cardiac enlargement with progression. Possible pericardial effusion. Vascular congestion. Negative for edema. Interval development of small to moderate left pleural effusion and left lower lobe atelectasis. Dialysis catheter in the lower SVC unchanged. IMPRESSION: Interval development marked cardiac enlargement suggesting pericardial effusion Interval development of left pleural effusion and left lower lobe airspace disease. Electronically Signed   By: Franchot Gallo M.D.   On: 03/04/2021 14:42   ECHOCARDIOGRAM COMPLETE  Result Date: 03/06/2021    ECHOCARDIOGRAM REPORT   Patient Name:   [name] Inda Date of Exam: 03/06/2021 Medical Rec #:  [id number]             Height:       72.0 in Accession #:    [id number]            Weight:       259.5 lb Date of Birth:  10-11-1946            BSA:          2.380 m Patient Age:    42 years              BP:           114/79 mmHg Patient Gender: M                     HR:           90 bpm. Exam Location:  [facility] Procedure: 2D Echo, Cardiac Doppler and Color Doppler Indications:     Pericardial effusion I31.3  History:         Patient has prior history of Echocardiogram examinations, most                  recent 11/08/2020. Risk Factors:Hypertension and Diabetes.  Sonographer:     Sherrie Sport RDCS (AE) Referring Phys:  [id number] Nolan Diagnosing Phys: Bartholome Bill MD  Sonographer Comments: Global longitudinal strain was attempted. IMPRESSIONS  1. Left  ventricular ejection fraction, by estimation, is 60 to 65%. The left ventricle has normal function. The left ventricle has no regional wall motion abnormalities. Left ventricular diastolic parameters were normal.  2. Right ventricular systolic function is normal. The right ventricular size is mildly enlarged.  3. Moderate pericardial effusion. The pericardial effusion is circumferential. There is no evidence of  cardiac tamponade.  4. The mitral valve is grossly normal. Trivial mitral valve regurgitation.  5. The aortic valve was not well visualized. Aortic valve regurgitation is trivial. FINDINGS  Left Ventricle: Left ventricular ejection fraction, by estimation, is 60 to 65%. The left ventricle has normal function. The left ventricle has no regional wall motion abnormalities. The left ventricular internal cavity size was normal in size. There is  borderline left ventricular hypertrophy. Left ventricular diastolic parameters were normal. Right Ventricle: The right ventricular size is mildly enlarged. No increase in right ventricular wall thickness. Right ventricular systolic function is normal. Left Atrium: Left atrial size was normal in size. Right Atrium: Right atrial size was normal in size. Pericardium: A moderately sized pericardial effusion is present. The pericardial effusion is circumferential. There is no evidence of cardiac tamponade. Mitral Valve: The mitral valve is grossly normal. Trivial mitral valve regurgitation. Tricuspid Valve: The tricuspid valve is grossly normal. Tricuspid valve regurgitation is mild. Aortic Valve: The aortic valve was not well visualized. Aortic valve regurgitation is trivial. Aortic valve mean gradient measures 2.0 mmHg. Aortic valve peak gradient measures 3.6 mmHg. Aortic valve area, by VTI measures 6.81 cm. Pulmonic Valve: The pulmonic valve was not well visualized. Pulmonic valve regurgitation is trivial. Aorta: The aortic root is normal in size and structure. IAS/Shunts: The interatrial septum was not assessed.  LEFT VENTRICLE PLAX 2D LVIDd:         3.77 cm  Diastology LVIDs:         2.50 cm  LV e' medial:    4.46 cm/s LV PW:         1.61 cm  LV E/e' medial:  15.0 LV IVS:        1.28 cm  LV e' lateral:   5.77 cm/s LVOT diam:     2.20 cm  LV E/e' lateral: 11.6 LV SV:         85 LV SV Index:   36 LVOT Area:     3.80 cm  RIGHT VENTRICLE RV Basal diam:  3.60 cm RV S prime:      14.70 cm/s TAPSE (M-mode): 4.5 cm LEFT ATRIUM             Index       RIGHT ATRIUM           Index LA diam:        3.50 cm 1.47 cm/m  RA Area:     30.60 cm LA Vol (A2C):   90.1 ml 37.86 ml/m RA Volume:   106.00 ml 44.54 ml/m LA Vol (A4C):   78.9 ml 33.15 ml/m LA Biplane Vol: 87.7 ml 36.85 ml/m  AORTIC VALVE                   PULMONIC VALVE AV Area (Vmax):    4.54 cm    PV Vmax:        0.57 m/s AV Area (Vmean):   4.31 cm    PV Peak grad:   1.3 mmHg AV Area (VTI):     6.81 cm    RVOT Peak grad: 2 mmHg AV Vmax:  94.70 cm/s AV Vmean:          59.200 cm/s AV VTI:            0.125 m AV Peak Grad:      3.6 mmHg AV Mean Grad:      2.0 mmHg LVOT Vmax:         113.00 cm/s LVOT Vmean:        67.100 cm/s LVOT VTI:          0.224 m LVOT/AV VTI ratio: 1.79  AORTA Ao Root diam: 3.43 cm MITRAL VALVE               TRICUSPID VALVE MV Area (PHT): 2.99 cm    TR Peak grad:   12.8 mmHg MV Decel Time: 254 msec    TR Vmax:        179.00 cm/s MV E velocity: 66.80 cm/s MV A velocity: 58.70 cm/s  SHUNTS MV E/A ratio:  1.14        Systemic VTI:  0.22 m                            Systemic Diam: 2.20 cm Bartholome Bill MD Electronically signed by Bartholome Bill MD Signature Date/Time: 03/06/2021/10:03:59 AM    Final     Scheduled Meds: . apixaban  2.5 mg Oral BID  . [START ON 02/16/2021] carvedilol  3.125 mg Oral BID WC  . Chlorhexidine Gluconate Cloth  6 each Topical Q0600  . DULoxetine  120 mg Oral Daily  . ezetimibe  10 mg Oral Daily  . midodrine  10 mg Oral Q M,W,F-HD  . mirtazapine  45 mg Oral QHS  . pantoprazole  40 mg Oral BID  . rOPINIRole  4 mg Oral BID AC  . rosuvastatin  10 mg Oral QHS  . sucralfate  1 g Oral TID    Continuous Infusions: . ceFEPime (MAXIPIME) IV       LOS: 1 day     Kayleen Memos, MD [facility] Pager [phone]  If 7PM-7AM, please contact night-coverage [email or web address] Password [email or web address] 03/06/2021, 1:42 PM

## 2021-03-06 NOTE — Progress Notes (Signed)
[place], [place] 03/06/21  Subjective:   LOS: 1  [name] is a 75 year old male currently followed by our office, Dr. Holley Raring.  He has a past medical history of ESRD on HD MWF, history of DVT on Eliquis, essential hypertension, chronic anxiety and depression, type 2 diabetes, and hyperlipidemia.  He presents to the emergency room with progressive shortness of breath and weakness for 5 days.  He was found to have a flutter on arrival to the ED with a heart rate of 160 and low BP.  He also stated he had decreased urine output.  He was admitted for generalized weakness.  He is a current patient of [facility] and receives dialysis at [facility].  He states he has maintained his schedule of dialysis treatments.  He states when he went on Monday his blood pressure dropped, and they had to turn UF off to complete his treatment.  He does say he has felt more weak and tired from the past few dialysis treatments.  Also states his shortness of breath has continued to worsen.  Patient is seen resting in bed Able to tolerate meals but endorses decreased appetite Denies nausea vomiting and diarrhea Denies current shortness of breath but is on 3L nasal cannula No oxygen requirement at home Patient seen later in bathroom Patient appears winded when returning from bathroom  Last HD was 03/04/21  Objective:  Vital signs in last 24 hours:  Temp:  [97.5 F (36.4 C)-98.7 F (37.1 C)] 98.2 F (36.8 C) (03/23 1130) Pulse Rate:  [49-159] 87 (03/23 1130) Resp:  [18-39] 27 (03/23 1144) BP: (77-118)/(53-98) 100/53 (03/23 1130) SpO2:  [95 %-100 %] 98 % (03/23 1130) Weight:  [113.4 kg-117.7 kg] 117.7 kg (03/23 0621)  Weight change:  Filed Weights   02/14/2021 1326 03/06/21 0621  Weight: 113.4 kg 117.7 kg    Intake/Output:    Intake/Output Summary (Last 24 hours) at 03/06/2021 1312 Last data filed at 03/06/2021 1021 Gross per 24 hour  Intake 980 ml  Output -  Net 980 ml      Physical Exam: General: No acute distress  HEENT Normocephalic. Atraumatic, moist oral mucosa  Pulm/lungs Rhonchi in bases, DOE  CVS/Heart irregular  Abdomen:  Soft, non tender  Extremities: Min peripheral edema  Neurologic: Alert, oriented, able to answer questions  Skin: No masses or rashes  Access: Rt IJ Permcth       Basic Metabolic Panel:  Recent Labs  Lab 03/04/2021 1325 03/06/21 0548  NA 137 138  K 3.7 4.0  CL 96* 97*  CO2 25 25  GLUCOSE 204* 111*  BUN 64* 72*  CREATININE 6.23* 7.12*  CALCIUM 7.0* 6.7*  MG  --  1.8  PHOS  --  9.0*     CBC: Recent Labs  Lab 03/04/2021 1325 03/06/21 0548  WBC 8.1 9.2  NEUTROABS 6.7 7.4  HGB 9.3* 9.3*  HCT 30.2* 30.8*  MCV 95.3 97.5  PLT 300 295      Lab Results  Component Value Date   HEPBSAG NON REACTIVE 10/02/2020   HEPBSAG NON REACTIVE 10/02/2020   HEPBSAB NON REACTIVE 10/02/2020   HEPBIGM NON REACTIVE 10/02/2020      Microbiology:  Recent Results (from the past 240 hour(s))  Resp Panel by RT-PCR (Flu A&B, Covid) Nasopharyngeal Swab     Status: None   Collection Time: 02/20/2021  3:45 PM   Specimen: Nasopharyngeal Swab; Nasopharyngeal(NP) swabs in vial transport medium  Result Value Ref Range Status  SARS Coronavirus 2 by RT PCR NEGATIVE NEGATIVE Final    Comment: (NOTE) SARS-CoV-2 target nucleic acids are NOT DETECTED.  The SARS-CoV-2 RNA is generally detectable in upper respiratory specimens during the acute phase of infection. The lowest concentration of SARS-CoV-2 viral copies this assay can detect is 138 copies/mL. A negative result does not preclude SARS-Cov-2 infection and should not be used as the sole basis for treatment or other patient management decisions. A negative result may occur with  improper specimen collection/handling, submission of specimen other than nasopharyngeal swab, presence of viral mutation(s) within the areas targeted by this assay, and inadequate number of  viral copies(<138 copies/mL). A negative result must be combined with clinical observations, patient history, and epidemiological information. The expected result is Negative.  Fact Sheet for Patients:  EntrepreneurPulse.com.au  Fact Sheet for Healthcare Providers:  IncredibleEmployment.be  This test is no t yet approved or cleared by the [place] FDA and  has been authorized for detection and/or diagnosis of SARS-CoV-2 by FDA under an Emergency Use Authorization (EUA). This EUA will remain  in effect (meaning this test can be used) for the duration of the COVID-19 declaration under Section 564(b)(1) of the Act, 21 U.S.C.section 360bbb-3(b)(1), unless the authorization is terminated  or revoked sooner.       Influenza A by PCR NEGATIVE NEGATIVE Final   Influenza B by PCR NEGATIVE NEGATIVE Final    Comment: (NOTE) The Xpert Xpress SARS-CoV-2/FLU/RSV plus assay is intended as an aid in the diagnosis of influenza from Nasopharyngeal swab specimens and should not be used as a sole basis for treatment. Nasal washings and aspirates are unacceptable for Xpert Xpress SARS-CoV-2/FLU/RSV testing.  Fact Sheet for Patients: EntrepreneurPulse.com.au  Fact Sheet for Healthcare Providers: IncredibleEmployment.be  This test is not yet approved or cleared by the [place] FDA and has been authorized for detection and/or diagnosis of SARS-CoV-2 by FDA under an Emergency Use Authorization (EUA). This EUA will remain in effect (meaning this test can be used) for the duration of the COVID-19 declaration under Section 564(b)(1) of the Act, 21 U.S.C. section 360bbb-3(b)(1), unless the authorization is terminated or revoked.  Performed at [facility], [address] [place], [place] [address]   CULTURE, BLOOD (ROUTINE X 2) w Reflex to ID Panel     Status: None (Preliminary result)   Collection Time:  02/19/2021  6:17 PM   Specimen: BLOOD  Result Value Ref Range Status   Specimen Description BLOOD BLOOD RIGHT HAND  Final   Special Requests   Final    BOTTLES DRAWN AEROBIC AND ANAEROBIC Blood Culture adequate volume   Culture   Final    NO GROWTH < 12 HOURS Performed at [facility], [address], [place], [place] [address]    Report Status PENDING  Incomplete  CULTURE, BLOOD (ROUTINE X 2) w Reflex to ID Panel     Status: None (Preliminary result)   Collection Time: 03/09/2021  6:18 PM   Specimen: BLOOD  Result Value Ref Range Status   Specimen Description BLOOD RIGHT ANTECUBITAL  Final   Special Requests   Final    BOTTLES DRAWN AEROBIC AND ANAEROBIC Blood Culture adequate volume   Culture   Final    NO GROWTH < 12 HOURS Performed at [facility], [address], [place],  [address]    Report Status PENDING  Incomplete  MRSA PCR Screening     Status: None   Collection Time: 03/06/21  9:20 AM  Specimen: Nasopharyngeal  Result Value Ref Range Status   MRSA by PCR NEGATIVE NEGATIVE Final    Comment:        The GeneXpert MRSA Assay (FDA approved for NASAL specimens only), is one component of a comprehensive MRSA colonization surveillance program. It is not intended to diagnose MRSA infection nor to guide or monitor treatment for MRSA infections. Performed at [facility], [address] [place], [place] [address]     Coagulation Studies: No results for input(s): LABPROT, INR in the last 72 hours.  Urinalysis: Recent Labs    02/27/2021 1451  COLORURINE AMBER*  LABSPEC 1.018  PHURINE 5.0  GLUCOSEU NEGATIVE  HGBUR NEGATIVE  BILIRUBINUR NEGATIVE  KETONESUR NEGATIVE  PROTEINUR 100*  NITRITE NEGATIVE  LEUKOCYTESUR MODERATE*      Imaging: DG Chest Port 1 View  Result Date: 02/14/2021 CLINICAL DATA:  Short of breath and edema.  Missed dialysis. EXAM: PORTABLE CHEST 1 VIEW COMPARISON:  02/21/2021 FINDINGS: Marked cardiac  enlargement with progression. Possible pericardial effusion. Vascular congestion. Negative for edema. Interval development of small to moderate left pleural effusion and left lower lobe atelectasis. Dialysis catheter in the lower SVC unchanged. IMPRESSION: Interval development marked cardiac enlargement suggesting pericardial effusion Interval development of left pleural effusion and left lower lobe airspace disease. Electronically Signed   By: Franchot Gallo M.D.   On: 02/17/2021 14:42   ECHOCARDIOGRAM COMPLETE  Result Date: 03/06/2021    ECHOCARDIOGRAM REPORT   Patient Name:   [name] Date of Exam: 03/06/2021 Medical Rec #:  [id number]             Height:       72.0 in Accession #:    [id number]            Weight:       259.5 lb Date of Birth:  11-24-1946            BSA:          2.380 m Patient Age:    59 years              BP:           114/79 mmHg Patient Gender: M                     HR:           90 bpm. Exam Location:  [facility] Procedure: 2D Echo, Cardiac Doppler and Color Doppler Indications:     Pericardial effusion I31.3  History:         Patient has prior history of Echocardiogram examinations, most                  recent 11/08/2020. Risk Factors:Hypertension and Diabetes.  Sonographer:     Sherrie Sport RDCS (AE) Referring Phys:  [id number] [place] Diagnosing Phys: Bartholome Bill MD  Sonographer Comments: Global longitudinal strain was attempted. IMPRESSIONS  1. Left ventricular ejection fraction, by estimation, is 60 to 65%. The left ventricle has normal function. The left ventricle has no regional wall motion abnormalities. Left ventricular diastolic parameters were normal.  2. Right ventricular systolic function is normal. The right ventricular size is mildly enlarged.  3. Moderate pericardial effusion. The pericardial effusion is circumferential. There is no evidence of cardiac tamponade.  4. The mitral valve is grossly normal. Trivial mitral valve regurgitation.  5. The aortic valve was  not well visualized. Aortic valve regurgitation is trivial. FINDINGS  Left Ventricle:  Left ventricular ejection fraction, by estimation, is 60 to 65%. The left ventricle has normal function. The left ventricle has no regional wall motion abnormalities. The left ventricular internal cavity size was normal in size. There is  borderline left ventricular hypertrophy. Left ventricular diastolic parameters were normal. Right Ventricle: The right ventricular size is mildly enlarged. No increase in right ventricular wall thickness. Right ventricular systolic function is normal. Left Atrium: Left atrial size was normal in size. Right Atrium: Right atrial size was normal in size. Pericardium: A moderately sized pericardial effusion is present. The pericardial effusion is circumferential. There is no evidence of cardiac tamponade. Mitral Valve: The mitral valve is grossly normal. Trivial mitral valve regurgitation. Tricuspid Valve: The tricuspid valve is grossly normal. Tricuspid valve regurgitation is mild. Aortic Valve: The aortic valve was not well visualized. Aortic valve regurgitation is trivial. Aortic valve mean gradient measures 2.0 mmHg. Aortic valve peak gradient measures 3.6 mmHg. Aortic valve area, by VTI measures 6.81 cm. Pulmonic Valve: The pulmonic valve was not well visualized. Pulmonic valve regurgitation is trivial. Aorta: The aortic root is normal in size and structure. IAS/Shunts: The interatrial septum was not assessed.  LEFT VENTRICLE PLAX 2D LVIDd:         3.77 cm  Diastology LVIDs:         2.50 cm  LV e' medial:    4.46 cm/s LV PW:         1.61 cm  LV E/e' medial:  15.0 LV IVS:        1.28 cm  LV e' lateral:   5.77 cm/s LVOT diam:     2.20 cm  LV E/e' lateral: 11.6 LV SV:         85 LV SV Index:   36 LVOT Area:     3.80 cm  RIGHT VENTRICLE RV Basal diam:  3.60 cm RV S prime:     14.70 cm/s TAPSE (M-mode): 4.5 cm LEFT ATRIUM             Index       RIGHT ATRIUM           Index LA diam:        3.50 cm  1.47 cm/m  RA Area:     30.60 cm LA Vol (A2C):   90.1 ml 37.86 ml/m RA Volume:   106.00 ml 44.54 ml/m LA Vol (A4C):   78.9 ml 33.15 ml/m LA Biplane Vol: 87.7 ml 36.85 ml/m  AORTIC VALVE                   PULMONIC VALVE AV Area (Vmax):    4.54 cm    PV Vmax:        0.57 m/s AV Area (Vmean):   4.31 cm    PV Peak grad:   1.3 mmHg AV Area (VTI):     6.81 cm    RVOT Peak grad: 2 mmHg AV Vmax:           94.70 cm/s AV Vmean:          59.200 cm/s AV VTI:            0.125 m AV Peak Grad:      3.6 mmHg AV Mean Grad:      2.0 mmHg LVOT Vmax:         113.00 cm/s LVOT Vmean:        67.100 cm/s LVOT VTI:  0.224 m LVOT/AV VTI ratio: 1.79  AORTA Ao Root diam: 3.43 cm MITRAL VALVE               TRICUSPID VALVE MV Area (PHT): 2.99 cm    TR Peak grad:   12.8 mmHg MV Decel Time: 254 msec    TR Vmax:        179.00 cm/s MV E velocity: 66.80 cm/s MV A velocity: 58.70 cm/s  SHUNTS MV E/A ratio:  1.14        Systemic VTI:  0.22 m                            Systemic Diam: 2.20 cm Bartholome Bill MD Electronically signed by Bartholome Bill MD Signature Date/Time: 03/06/2021/10:03:59 AM    Final      Medications:   . ceFEPime (MAXIPIME) IV     . apixaban  2.5 mg Oral BID  . carvedilol  6.25 mg Oral BID WC  . Chlorhexidine Gluconate Cloth  6 each Topical Q0600  . DULoxetine  120 mg Oral Daily  . ezetimibe  10 mg Oral Daily  . midodrine  10 mg Oral Q M,W,F-HD  . mirtazapine  45 mg Oral QHS  . pantoprazole  40 mg Oral BID  . rOPINIRole  4 mg Oral BID AC  . rosuvastatin  10 mg Oral QHS  . sucralfate  1 g Oral TID   acetaminophen, clonazepam  Assessment/ Plan:  75 y.o. male with past medical history of ESRD on HD MWF, history of DVT on Eliquis, essential hypertension, chronic anxiety and depression, type 2 diabetes, and hyperlipidemia was admitted on 02/24/2021   Active Problems:   UTI (urinary tract infection)  UTI (urinary tract infection) [N39.0] Acute cystitis without hematuria [N30.00] Generalized  weakness [R53.1] Atypical atrial flutter ([place]) [I48.4]  [facility]/MWF/ Rt IJ Permcath  #. ESRD on HD Scheduled to receive dialysis today UF goal of 1L Will begin with low BFR and titrate up as tolerated Next scheduled dialysis Friday, but may need extra treatment tomorrow  #. Anemia of CKD  Lab Results  Component Value Date   HGB 9.3 (L) 03/06/2021   Will order EPO with dialysis  #. Secondary hyperparathyroidism of renal origin N 25.81   No results found for: PTH Lab Results  Component Value Date   PHOS 9.0 (H) 03/06/2021   Monitor calcium and phos level during this admission Will consider binders   #. Diabetes type 2 with CKD Hemoglobin A1C (%)  Date Value  01/27/2014 5.5   Hgb A1c MFr Bld (%)  Date Value  03/06/2021 5.7 (H)  Stable with diet   LOS: [place] 3/23/20221:12 PM  [facility] [place], [place]

## 2021-03-06 NOTE — Consult Note (Signed)
Pharmacy Antibiotic Note  [name] is a 75 y.o. male admitted on 02/14/2021 with sepsis thought to be secondary to UTI. Pharmacy has been consulted for zosyn dosing. Patient has ESRD on HD.   D2 antibiotics: cefepime > Zosyn  Plan:  Start Zosyn 2.25 g IV q8h Monitor clinical picture, F/U C&S, abx deescalation / LOT   Height: 6' (182.9 cm) Weight: 117.7 kg (259 lb 7.7 oz) IBW/kg (Calculated) : 77.6  Temp (24hrs), Avg:98 F (36.7 C), Min:97.5 F (36.4 C), Max:98.4 F (36.9 C)  Recent Labs  Lab 03/04/2021 1325 03/06/21 0548  WBC 8.1 9.2  CREATININE 6.23* 7.12*    Estimated Creatinine Clearance: 12.4 mL/min (A) (by C-G formula based on SCr of 7.12 mg/dL (H)).    Allergies  Allergen Reactions  . Bupropion     Other reaction(s): Other (See Comments) Mood swings, angry Other reaction(s): Other (See Comments) Mood swings, angry    Antimicrobials this admission: 3/22 Vancomycin x 1 3/22 cefepime >> 3/23 3/23 Zosyn >>  Dose adjustments this admission:  Microbiology results: 3/22 BCx: NG < 12 hours 3/22 UCx: NG < 12 hours 3/23 MRSA PCR: neg  Thank you for allowing pharmacy to be a part of this patient's care.  Darnelle Bos, PharmD 03/06/2021 9:25 PM

## 2021-03-06 NOTE — Progress Notes (Signed)
This note also relates to the following rows which could not be included: ECG Heart Rate - Cannot attach notes to unvalidated device data Resp - Cannot attach notes to unvalidated device data   

## 2021-03-06 NOTE — Progress Notes (Signed)
   03/06/21 1130  Assess: MEWS Score  Temp 98.2 F (36.8 C)  BP (!) 100/53  Pulse Rate 87  Resp (!) 26 (RN notified)  SpO2 98 %  O2 Device Nasal Cannula  O2 Flow Rate (L/min) 4 L/min  Assess: MEWS Score  MEWS Temp 0  MEWS Systolic 1  MEWS Pulse 0  MEWS RR 2  MEWS LOC 0  MEWS Score 3  MEWS Score Color Yellow  Assess: if the MEWS score is Yellow or Red  Were vital signs taken at a resting state? Yes  Focused Assessment No change from prior assessment  Early Detection of Sepsis Score *See Row Information* Low  MEWS guidelines implemented *See Row Information* Yes  Treat  MEWS Interventions Escalated (See documentation below)  Take Vital Signs  Increase Vital Sign Frequency  Yellow: Q 2hr X 2 then Q 4hr X 2, if remains yellow, continue Q 4hrs  Escalate  MEWS: Escalate Yellow: discuss with charge nurse/RN and consider discussing with provider and RRT  Notify: Charge Nurse/RN  Name of Charge Nurse/RN Notified Annabella,RN  Date Charge Nurse/RN Notified 03/06/21  Time Charge Nurse/RN Notified 1150

## 2021-03-06 NOTE — Progress Notes (Signed)
Arrival to ICU.

## 2021-03-06 NOTE — Progress Notes (Signed)
*  PRELIMINARY RESULTS* Echocardiogram 2D Echocardiogram has been performed.  Sherrie Sport 03/06/2021, 8:56 AM

## 2021-03-06 NOTE — Progress Notes (Addendum)
°   03/06/21 1945  Clinical Encounter Type  Visited With Patient  Visit Type Initial;Spiritual support;Social support  Referral From Nurse  Consult/Referral To Chaplain   Chaplain responded to a Rapid Response page. Received update on patients condition from [employer]. Chaplain provided spiritual support and prayer outside Pt's room.

## 2021-03-06 NOTE — Progress Notes (Signed)
PT Cancellation Note  Patient Details Name: [name] MRN: [id number] DOB: 08-23-1946   Cancelled Treatment:    Reason Eval/Treat Not Completed: Patient at procedure or test/unavailable Pt out of room this afternoon, will re-attempt eval tomorrow.  Kreg Shropshire, DPT 03/06/2021, 4:22 PM

## 2021-03-06 NOTE — Progress Notes (Signed)
OT Cancellation Note  Patient Details Name: [name] MRN: [id number] DOB: 07-23-1946   Cancelled Treatment:    Reason Eval/Treat Not Completed: Patient at procedure or test/ unavailable. Consult received, chart reviewed. Pt out of room upon attempt. Will re-attempt at later date/time as available and medically appropriate.   Hanley Hays, MPH, MS, OTR/L ascom [phone] 03/06/21, 3:07 PM

## 2021-03-06 NOTE — Progress Notes (Signed)
PT Cancellation Note  Patient Details Name: [name] MRN: [id number] DOB: May 02, 1946   Cancelled Treatment:    Reason Eval/Treat Not Completed: Patient at procedure or test/unavailable Attempted to see pt x2 this AM.  First attempt he was having an ECHO, later MD was in with pt.  Will continue to follow and attempt to see as time allows and pt is appropriate/available.  Kreg Shropshire, DPT 03/06/2021, 1:49 PM

## 2021-03-06 NOTE — Consult Note (Addendum)
NAME:  [name], MRN:  [id number], DOB:  1946/04/23, LOS: 1 ADMISSION DATE:  03/03/2021, CONSULTATION DATE: 03/06/2021 REFERRING MD: Sharion Settler, NP, CHIEF COMPLAINT: Hypotension    History of Present Illness:  This is a 75 yo male who presented to [facility] ER via EMS on 03/22 from home via EMS with c/o shortness of breath, fatigue, and weakness onset several weeks priors to presentation.  He has a hx of ESRD requiring HD M-W-F, last HD session 03/21, however unable to complete the full session due to hypotension. At baseline he voids some although in the last few days he has been unable to void. He reports he has had weakness and shortness of breath after he had a clogged dialysis catheter that required catheter lysis on 03/9.  He subsequently presented to the ER on 03/10 with chest pain.  CTA Chest performed which was negative for PE although dilated pulmonary arteries were noted along with a small pericardial effusion, and 3-vessel coronary atherosclerosis.  During that presentation bp marginal spb 90's.  ER physician discussed the case with on call Nephrologist Dr. Harvest Dark who advised he would monitor the pt closely during his scheduled HD session the next day, and pt did not require hospital admission.  ED course Upon arrival to the ER EKG revealed atrial flutter vs. atrial fibrillation with rvr.  Pt also hypotensive and diaphoretic.  He subsequently received low dose etomidate and underwent synchronized cardioversion which was successful cardiac rhythm sinus rhythm.  Pts hypotension improved post cardioversion.  CXR concerning for pericardial effusion, left pleural effusion, and left lower lobe airspace disease. UA concerning for UTI. Abx therapy initiated. ER physician consulted cardiology and nephrology. Hospitalist team admitted pt to the medsurg unit for additional workup and treatment.   Hospital course On 03/23 pt required transfer to the stepdown unit with wide complex  tachycardia hr. 140-150's and hypotension sbp 60-70's.  He required amiodarone bolus followed by continuous gtt and peripheral levophed.  PCCM team consulted to assist with management.    Pertinent  Medical History  OSA Renal Cell Carcinoma HTN HLD Type II Diabetes Mellitus Depression COPD Chronic Back Pain ESRD HD M-W-F  Significant Hospital Events: Including procedures, antibiotic start and stop dates in addition to other pertinent events   03/22: Pt presented to [facility] ER with weakness and shortness of breath.  Found to be hypotensive and cardiac rhythm atrial fibrillation with rvr.  Underwent successful cardioversion with improvement in bp 03/23: Pt admitted to the medsurg unit per hospitalist team for additional workup and treatment 03/23: Pt required transfer to the stepdown unit with wide complex tachycardia and hypotension requiring levophed and amiodarone gtts. PCCM team consulted to assist with management  Interim History / Subjective:  Pt c/o mild shortness of breath and cough   Objective   Blood pressure 93/62, pulse (!) 30, temperature 98.2 F (36.8 C), temperature source Oral, resp. rate (!) 28, height 6' (1.829 m), weight 117.7 kg, SpO2 98 %.        Intake/Output Summary (Last 24 hours) at 03/06/2021 2258 Last data filed at 03/06/2021 1850 Gross per 24 hour  Intake 900 ml  Output 1000 ml  Net -100 ml   Filed Weights   02/25/2021 1326 03/06/21 0621  Weight: 113.4 kg 117.7 kg   Examination: General: acutely ill appearing male, resting in bed NAD  HENT: supple, no JVD  Lungs: diminished throughout, even, non labored  Cardiovascular: sinus tachycardia, no R/G, 2+ radial/1+distal pulses, trace bilateral lower  extremity edema  Abdomen: +BS x4, obese, soft, non distended, non tender  Extremities: left leg bulk larger which is his baseline, moves all extremities  Neuro: alert and oriented, follows commands GU: currently anuric   Labs/imaging that I havepersonally  reviewed   03/23: BUN 72, creatinine 7.12, calcium 6.7, anion gap 16, phosphorous 9.0, albumin 3.1, lactic acid 2.2, pct 1.05, BNP 173.2, hgb 9.4, TSH 3.513, free T4 0.81, respiratory panel by rt-pcr negative, and UA concerning for UTI   CXR concerning for cardiomegaly, vascular congestion, small bilateral effusions, and left base atelectasis   Resolved Hospital Problem list   N/A  Assessment & Plan:   Acute on chronic respiratory failure secondary to vascular congestion in the setting of ESRD LLL atelectasis  Hx: COPD and OSA Prn supplemental O2 for dyspnea and/or hypoxia  Aggressive pulmonary hygiene Prn bronchodilator therapy  HD per nephrology recommendations  Wide complex tachycardia  Atrial fibrillation with rvr s/p synchronized cardioversion initially on converted to sinus rhythm 03/22 Echo 03/23~moderate pericardial effusion no evidence of cardiac tamponade Mildly elevated troponin likely demand ischemia  Hypotension likely cardiogenic shock  Hx: HTN and HLD Continuous telemetry monitoring  Will transition pt off levophed and start vasopressin gtt due to wide complex tachycardia  Cardiology consulted appreciate input-continue amiodarone gtt per recommendations  Hold outpatient carvedilol for now Continue outpatient apixaban, ezetimibe, and rosuvastatin   ESRD on HD M-W-F  Hx: Renal cell carcinoma  Trend BMP  Replace electrolytes as indicated  Avoid nephrotoxic medications  Nephrology consulted appreciate input Pt does occasionally void although has voided in several days will bladder scan to assess for urinary retention   UTI Trend WBC and monitor fever curve Trend PCT  Follow cultures  Continue zosyn for now   Type II diabetes mellitus  Monitor serum glucose If glucose >150 will start SSI  Depression  Continue remeron and cymbalta    Best practice (evaluated daily)  Diet:  Oral Pain/Anxiety/Delirium protocol (if indicated): No VAP protocol (if  indicated): Not indicated DVT prophylaxis: Systemic AC GI prophylaxis: PPI Glucose control:  SSI No Central venous access:  N/A Arterial line:  N/A Foley:  N/A Mobility:  bed rest  PT consulted: N/A Last date of multidisciplinary goals of care discussion [N/A] Code Status:  full code Disposition: ICU  Labs   CBC: Recent Labs  Lab 03/08/2021 1325 03/06/21 0548 03/06/21 2101  WBC 8.1 9.2 7.4  NEUTROABS 6.7 7.4 5.9  HGB 9.3* 9.3* 9.4*  HCT 30.2* 30.8* 31.2*  MCV 95.3 97.5 97.5  PLT 300 295 829    Basic Metabolic Panel: Recent Labs  Lab 03/03/2021 1325 03/06/21 0548  NA 137 138  K 3.7 4.0  CL 96* 97*  CO2 25 25  GLUCOSE 204* 111*  BUN 64* 72*  CREATININE 6.23* 7.12*  CALCIUM 7.0* 6.7*  MG  --  1.8  PHOS  --  9.0*   GFR: Estimated Creatinine Clearance: 12.4 mL/min (A) (by C-G formula based on SCr of 7.12 mg/dL (H)). Recent Labs  Lab 02/23/2021 1325 03/06/21 0548 03/06/21 2101  PROCALCITON  --   --  1.05  WBC 8.1 9.2 7.4  LATICACIDVEN  --   --  2.2*    Liver Function Tests: Recent Labs  Lab 03/11/2021 1325 03/06/21 0548  AST 142* 74*  ALT 110* 98*  ALKPHOS 88 83  BILITOT 0.9 0.9  PROT 6.4* 6.6  ALBUMIN 3.0* 3.1*   No results for input(s): LIPASE, AMYLASE in the last 168  hours. No results for input(s): AMMONIA in the last 168 hours.  ABG No results found for: PHART, PCO2ART, PO2ART, HCO3, TCO2, ACIDBASEDEF, O2SAT   Coagulation Profile: No results for input(s): INR, PROTIME in the last 168 hours.  Cardiac Enzymes: No results for input(s): CKTOTAL, CKMB, CKMBINDEX, TROPONINI in the last 168 hours.  HbA1C: Hemoglobin A1C  Date/Time Value Ref Range Status  01/27/2014 05:31 AM 5.5 4.2 - 6.3 % Final    Comment:    The American Diabetes Association recommends that a primary goal of therapy should be <7% and that physicians should reevaluate the treatment regimen in patients with HbA1c values consistently >8%.    Hgb A1c MFr Bld  Date/Time Value Ref  Range Status  03/06/2021 05:48 AM 5.7 (H) 4.8 - 5.6 % Final    Comment:    (NOTE) Pre diabetes:          5.7%-6.4%  Diabetes:              >6.4%  Glycemic control for   <7.0% adults with diabetes   11/08/2020 04:39 AM 5.6 4.8 - 5.6 % Final    Comment:    (NOTE) Pre diabetes:          5.7%-6.4%  Diabetes:              >6.4%  Glycemic control for   <7.0% adults with diabetes     CBG: Recent Labs  Lab 02/27/2021 2004 03/06/21 1952  GLUCAP 92 93    Review of Systems: Positives in BOLD   Gen: fever, chills, weight change, fatigue, night sweats HEENT: Denies blurred vision, double vision, hearing loss, tinnitus, sinus congestion, rhinorrhea, sore throat, neck stiffness, dysphagia PULM: shortness of breath, cough, sputum production, hemoptysis, wheezing CV: Denies chest pain, edema, orthopnea, paroxysmal nocturnal dyspnea, palpitations GI: Denies abdominal pain, nausea, vomiting, diarrhea, hematochezia, melena, constipation, change in bowel habits GU: Denies dysuria, hematuria, polyuria, oliguria, urethral discharge Endocrine: Denies hot or cold intolerance, polyuria, polyphagia or appetite change Derm: Denies rash, dry skin, scaling or peeling skin change Heme: Denies easy bruising, bleeding, bleeding gums Neuro: headache, numbness, weakness, slurred speech, loss of memory or consciousness   Past Medical History:  He,  has a past medical history of Chronic back pain, COPD (chronic obstructive pulmonary disease) ([place]), Depression, Diabetes mellitus without complication ([place]), Hyperlipidemia, Hypertension, Renal cell carcinoma ([place]), and Sleep apnea.   Surgical History:   Past Surgical History:  Procedure Laterality Date   CARPAL TUNNEL RELEASE     CHOLECYSTECTOMY     COLONOSCOPY WITH PROPOFOL N/A 07/29/2017   Procedure: COLONOSCOPY WITH PROPOFOL;  Surgeon: Manya Silvas, MD;  [facility];  Service: Endoscopy;  Laterality: N/A;   colonoscopys      DIALYSIS/PERMA CATHETER INSERTION N/A 10/09/2020   Procedure: DIALYSIS/PERMA CATHETER INSERTION;  Surgeon: Katha Cabal, MD;  Location: [place] CV LAB;  Service: Cardiovascular;  Laterality: N/A;   ESOPHAGOGASTRODUODENOSCOPY (EGD) WITH PROPOFOL N/A 07/29/2017   Procedure: ESOPHAGOGASTRODUODENOSCOPY (EGD) WITH PROPOFOL;  Surgeon: Manya Silvas, MD;  Location: [facility] ENDOSCOPY;  Service: Endoscopy;  Laterality: N/A;   FLEXIBLE SIGMOIDOSCOPY     GASTRIC BYPASS     NEPHRECTOMY     open reduction and internal fixation, right distal radius     right knee arthroscopy     TEMPORARY DIALYSIS CATHETER N/A 10/02/2020   Procedure: TEMPORARY DIALYSIS CATHETER;  Surgeon: Katha Cabal, MD;  Location: [place] CV LAB;  Service: Cardiovascular;  Laterality: N/A;  UPPER GI ENDOSCOPY       Social History:   reports that he has quit smoking. He has a 30.00 pack-year smoking history. His smokeless tobacco use includes chew. He reports that he does not drink alcohol and does not use drugs.   Family History:  His family history includes Cancer in his mother; Diabetes in his mother; Heart disease in his father.   Allergies Allergies  Allergen Reactions   Bupropion     Other reaction(s): Other (See Comments) Mood swings, angry Other reaction(s): Other (See Comments) Mood swings, angry     Home Medications  Prior to Admission medications   Medication Sig Start Date End Date Taking? Authorizing Provider  apixaban (ELIQUIS) 2.5 MG TABS tablet Take 1 tablet (2.5 mg total) by mouth 2 (two) times daily. 10/12/20  Yes Swayze, Ava, DO  carvedilol (COREG) 6.25 MG tablet Take 6.25 mg by mouth 2 (two) times daily with a meal.   Yes [provider]  DULoxetine (CYMBALTA) 60 MG capsule Take 120 mg by mouth daily.  09/01/19  Yes [provider]  ezetimibe (ZETIA) 10 MG tablet Take by mouth. 11/22/20 11/22/21 Yes [provider]  glipiZIDE (GLUCOTROL XL) 5 MG 24  hr tablet Take 5 mg by mouth daily. 07/25/20  Yes [provider]  mirtazapine (REMERON) 45 MG tablet Take 45 mg by mouth at bedtime.  07/05/20  Yes [provider]  multivitamin (RENA-VIT) TABS tablet Take 1 tablet by mouth at bedtime. 10/12/20  Yes Swayze, Ava, DO  pantoprazole (PROTONIX) 40 MG tablet Take 40 mg by mouth 2 (two) times daily.    Yes [provider]  rOPINIRole (REQUIP) 4 MG tablet Take 1 tablet (4 mg total) by mouth 2 (two) times daily before a meal. 10/12/20  Yes Swayze, Ava, DO  rosuvastatin (CRESTOR) 10 MG tablet Take 1 tablet (10 mg total) by mouth daily. 11/09/20  Yes Pokhrel, Laxman, MD  sucralfate (CARAFATE) 1 g tablet Take 1 g by mouth 3 (three) times daily.    Yes [provider]     Marda Stalker, [place] Pager [phone] (please enter 7 digits) PCCM Consult Pager [phone] (please enter 7 digits)

## 2021-03-06 NOTE — Progress Notes (Signed)
Patient Name: [name] Date of Encounter: 03/06/2021  Hospital Problem List     Active Problems:   UTI (urinary tract infection)    Patient Profile     75 y.o. male with history of COPD, diabetes, hypertension, hyperlipidemia, end-stage renal disease on hemodialysis who presented to emergency room with shortness of breath and weakness.  For the last several days he has had worsening weakness.  He has been short of breath.  He underwent dialysis on Friday but had to stop early due to hypotension.  He could not go to dialysis yesterday due to weakness.  He has been fatigued and short of breath.  In the emergency room he was noted to be hypotensive and was in A. fib with RVR.  Due to rapid A. fib and hypertension he underwent cardioversion with conversion to sinus arrhythmia.  He is on carvedilol 6.25 mg twice daily and apixaban as an outpatient.  Initial EKG showed atrial fibrillation with RVR.  After cardioversion he was in sinus rhythm with right bundle branch block.  Chest x-ray revealed cardiomegaly.  Left pleural effusion.  CT of the chest on 02/21/2021 revealed a small pericardial effusion. Blood pressure improved this am. Echo revealed small to moderate pericardial effusion with no evidence of hemodynamic compromise.   Subjective   States he feels a little better than yesterday daily.  Has some upper chest congestion but no other complaints  Inpatient Medications    . apixaban  2.5 mg Oral BID  . carvedilol  6.25 mg Oral BID WC  . Chlorhexidine Gluconate Cloth  6 each Topical Daily  . DULoxetine  120 mg Oral Daily  . ezetimibe  10 mg Oral Daily  . lipase/protease/amylase  72,000 Units Oral TID WC  . mirtazapine  45 mg Oral QHS  . pantoprazole  40 mg Oral BID  . rOPINIRole  4 mg Oral BID AC  . rosuvastatin  10 mg Oral QHS  . sucralfate  1 g Oral TID    Vital Signs    Vitals:   02/12/2021 2345 03/06/21 0504 03/06/21 0621 03/06/21 0734  BP: 116/77 113/71  114/79   Pulse: 92 (!) 49  90  Resp: 20 18  20   Temp: 98.4 F (36.9 C) (!) 97.5 F (36.4 C)  (!) 97.5 F (36.4 C)  TempSrc: Oral Oral    SpO2:  99%  95%  Weight:   117.7 kg   Height:        Intake/Output Summary (Last 24 hours) at 03/06/2021 0848 Last data filed at 03/02/2021 1405 Gross per 24 hour  Intake 200 ml  Output --  Net 200 ml   Filed Weights   03/08/2021 1326 03/06/21 0621  Weight: 113.4 kg 117.7 kg    Physical Exam    GEN: Well nourished, well developed, in no acute distress.  HEENT: normal.  Neck: Supple, no JVD, carotid bruits, or masses. Cardiac: RRR, no murmurs, rubs, or gallops. No clubbing, cyanosis, edema.  Radials/DP/PT 2+ and equal bilaterally.  Respiratory:  Respirations regular and unlabored, clear to auscultation bilaterally. GI: Soft, nontender, nondistended, BS + x 4. MS: no deformity or atrophy. Skin: warm and dry, no rash. Neuro:  Strength and sensation are intact. Psych: Normal affect.  Labs    CBC Recent Labs    02/19/2021 1325 03/06/21 0548  WBC 8.1 9.2  NEUTROABS 6.7 7.4  HGB 9.3* 9.3*  HCT 30.2* 30.8*  MCV 95.3 97.5  PLT 300 295   Basic  Metabolic Panel Recent Labs    02/20/2021 1325 03/06/21 0548  NA 137 138  K 3.7 4.0  CL 96* 97*  CO2 25 25  GLUCOSE 204* 111*  BUN 64* 72*  CREATININE 6.23* 7.12*  CALCIUM 7.0* 6.7*  MG  --  1.8  PHOS  --  9.0*   Liver Function Tests Recent Labs    02/14/2021 1325 03/06/21 0548  AST 142* 74*  ALT 110* 98*  ALKPHOS 88 83  BILITOT 0.9 0.9  PROT 6.4* 6.6  ALBUMIN 3.0* 3.1*   No results for input(s): LIPASE, AMYLASE in the last 72 hours. Cardiac Enzymes No results for input(s): CKTOTAL, CKMB, CKMBINDEX, TROPONINI in the last 72 hours. BNP Recent Labs    02/16/2021 1325  BNP 530.6*   D-Dimer No results for input(s): DDIMER in the last 72 hours. Hemoglobin A1C No results for input(s): HGBA1C in the last 72 hours. Fasting Lipid Panel No results for input(s): CHOL, HDL, LDLCALC, TRIG,  CHOLHDL, LDLDIRECT in the last 72 hours. Thyroid Function Tests No results for input(s): TSH, T4TOTAL, T3FREE, THYROIDAB in the last 72 hours.  Invalid input(s): FREET3  Telemetry  Normal sinus rhythm  ECG       Radiology    DG Chest 2 View  Result Date: 02/21/2021 CLINICAL DATA:  Chest pain EXAM: CHEST - 2 VIEW COMPARISON:  October 07, 2020 FINDINGS: Central catheter tip in superior vena cava near the cavoatrial junction. No pneumothorax. There is left base atelectasis. Lungs elsewhere are clear. Heart is slightly enlarged with pulmonary vascularity normal. No adenopathy. No bone lesions. IMPRESSION: Central catheter tip in superior vena cava near the cavoatrial junction. No pneumothorax. Left base atelectasis. No edema or airspace opacity. Mild cardiac prominence. Electronically Signed   By: Lowella Grip III M.D.   On: 02/21/2021 13:49   CT Angio Chest PE W and/or Wo Contrast  Result Date: 02/21/2021 CLINICAL DATA:  Right upper chest pain post dialysis catheter flushing last night. EXAM: CT ANGIOGRAPHY CHEST WITH CONTRAST TECHNIQUE: Multidetector CT imaging of the chest was performed using the standard protocol during bolus administration of intravenous contrast. Multiplanar CT image reconstructions and MIPs were obtained to evaluate the vascular anatomy. CONTRAST:  54mL OMNIPAQUE IOHEXOL 350 MG/ML SOLN COMPARISON:  Chest radiograph from earlier today. 01/28/2014 chest CT. FINDINGS: Cardiovascular: The study is high quality for the evaluation of pulmonary embolism. There are no filling defects in the central, lobar, segmental or subsegmental pulmonary artery branches to suggest acute pulmonary embolism. Normal course and caliber of the thoracic aorta. Dilated main pulmonary artery (3.8 cm diameter). Normal heart size. Small pericardial effusion. Three-vessel coronary atherosclerosis. Right internal jugular central venous catheter terminates in the lower third of the SVC.  Mediastinum/Nodes: No discrete thyroid nodules. Unremarkable esophagus. No pathologically enlarged axillary, mediastinal or hilar lymph nodes. Lungs/Pleura: No pneumothorax. No pleural effusion. No acute consolidative airspace disease, lung masses or significant pulmonary nodules. Platelike parenchymal bands at the left greater than right lung bases compatible with scarring or atelectasis. 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 Musculoskeletal: No aggressive appearing focal osseous lesions. Mild thoracic spondylosis. Review of the MIP images confirms the above findings. IMPRESSION: 1. No pulmonary embolism. 2. Dilated main pulmonary artery, suggesting pulmonary arterial hypertension. 3. Small pericardial effusion. 4. Three-vessel coronary atherosclerosis. 5. Platelike parenchymal bands at the left greater than right lung bases, compatible with scarring or atelectasis. Electronically Signed   By: Ilona Sorrel M.D.   On: 02/21/2021 15:45  DG Chest Port 1 View  Result Date: 03/09/2021 CLINICAL DATA:  Short of breath and edema.  Missed dialysis. EXAM: PORTABLE CHEST 1 VIEW COMPARISON:  02/21/2021 FINDINGS: Marked cardiac enlargement with progression. Possible pericardial effusion. Vascular congestion. Negative for edema. Interval development of small to moderate left pleural effusion and left lower lobe atelectasis. Dialysis catheter in the lower SVC unchanged. IMPRESSION: Interval development marked cardiac enlargement suggesting pericardial effusion Interval development of left pleural effusion and left lower lobe airspace disease. Electronically Signed   By: Franchot Gallo M.D.   On: 03/04/2021 14:42    Assessment & Plan    75 year old male with history of end-stage renal disease on hemodialysis who presented to emergency room with complaints of weakness fatigue and shortness of breath.  He was noted to be somewhat hypotensive and in A. fib with RVR.   He converted to sinus rhythm after cardioversion in the emergency room.  He had been on carvedilol and Eliquis prior to presentation.  He is currently in sinus rhythm and he feels better than when he presented.  He denies syncope.  1.  Hypotension-etiology unclear.  Patient had a small pericardial effusion several weeks ago by CT.  Chest x-ray suggest cardiomegaly.    Echocardiogram reveals normal LV function with probable moderate pericardial effusion.  No evidence of hemodynamic compromise.  Does not appear to have any tamponade physiology.  Right ventricle right atrium are expanding normally.  Does not appear to be a candidate for pericardiocentesis or pericardial window at present.  Will need to follow. Would continue with careful hydration.  Cultures pending.  Empiric antibiotics in place.  2.  Atrial fibrillation with RVR-converted to sinus rhythm.  Currently in sinus rhythm on carvedilol.  Blood pressure does not allow more aggressive treatment at present.  Would continue with Eliquis and follow.  3.  End-stage renal disease-continue with hemodialysis.  Signed, Javier Docker Jaquitta Dupriest MD 03/06/2021, 8:48 AM  Pager: [phone]

## 2021-03-07 ENCOUNTER — Encounter: Admission: EM | Disposition: E | Payer: Self-pay | Source: Home / Self Care | Attending: Pulmonary Disease

## 2021-03-07 ENCOUNTER — Encounter: Payer: Self-pay | Admitting: Internal Medicine

## 2021-03-07 ENCOUNTER — Encounter: Payer: Self-pay | Admitting: Certified Registered"

## 2021-03-07 ENCOUNTER — Inpatient Hospital Stay: Payer: Medicare Other

## 2021-03-07 ENCOUNTER — Inpatient Hospital Stay
Admit: 2021-03-07 | Discharge: 2021-03-07 | Disposition: A | Discharge status: Home / Self Care | Payer: Medicare Other | Attending: Cardiology | Admitting: Cardiology

## 2021-03-07 DIAGNOSIS — N186 End stage renal disease: Secondary | ICD-10-CM

## 2021-03-07 DIAGNOSIS — I4891 Unspecified atrial fibrillation: Secondary | ICD-10-CM

## 2021-03-07 DIAGNOSIS — R579 Shock, unspecified: Secondary | ICD-10-CM

## 2021-03-07 DIAGNOSIS — I472 Ventricular tachycardia: Secondary | ICD-10-CM

## 2021-03-07 DIAGNOSIS — I313 Pericardial effusion (noninflammatory): Secondary | ICD-10-CM

## 2021-03-07 HISTORY — PX: PERICARDIOCENTESIS: CATH118255

## 2021-03-07 LAB — COMPREHENSIVE METABOLIC PANEL
ALT: 128 U/L — ABNORMAL HIGH (ref 0–44)
AST: 104 U/L — ABNORMAL HIGH (ref 15–41)
Albumin: 3.3 g/dL — ABNORMAL LOW (ref 3.5–5.0)
Alkaline Phosphatase: 91 U/L (ref 38–126)
Anion gap: 15 (ref 5–15)
BUN: 54 mg/dL — ABNORMAL HIGH (ref 8–23)
CO2: 25 mmol/L (ref 22–32)
Calcium: 7 mg/dL — ABNORMAL LOW (ref 8.9–10.3)
Chloride: 96 mmol/L — ABNORMAL LOW (ref 98–111)
Creatinine, Ser: 5.85 mg/dL — ABNORMAL HIGH (ref 0.61–1.24)
GFR, Estimated: 10 mL/min — ABNORMAL LOW (ref >60)
Glucose, Bld: 265 mg/dL — ABNORMAL HIGH (ref 70–99)
Potassium: 4 mmol/L (ref 3.5–5.1)
Sodium: 136 mmol/L (ref 135–145)
Total Bilirubin: 0.9 mg/dL (ref 0.3–1.2)
Total Protein: 6.6 g/dL (ref 6.5–8.1)

## 2021-03-07 LAB — GLUCOSE, CAPILLARY
Glucose-Capillary: 225 mg/dL — ABNORMAL HIGH (ref 70–99)
Glucose-Capillary: 165 mg/dL — ABNORMAL HIGH (ref 70–99)
Glucose-Capillary: 164 mg/dL — ABNORMAL HIGH (ref 70–99)
Glucose-Capillary: 149 mg/dL — ABNORMAL HIGH (ref 70–99)

## 2021-03-07 LAB — BODY FLUID CELL COUNT WITH DIFFERENTIAL
Eos, Fluid: 0 %
Lymphs, Fluid: 35 %
Monocyte-Macrophage-Serous Fluid: 11 %
Neutrophil Count, Fluid: 54 %
Total Nucleated Cell Count, Fluid: 15945 cu mm

## 2021-03-07 LAB — CBC WITH DIFFERENTIAL/PLATELET
Abs Immature Granulocytes: 0.06 10*3/uL (ref 0.00–0.07)
Basophils Absolute: 0 10*3/uL (ref 0.0–0.1)
Basophils Relative: 0 %
Eosinophils Absolute: 0.1 10*3/uL (ref 0.0–0.5)
Eosinophils Relative: 1 %
HCT: 31.2 % — ABNORMAL LOW (ref 39.0–52.0)
Hemoglobin: 9.3 g/dL — ABNORMAL LOW (ref 13.0–17.0)
Immature Granulocytes: 1 %
Lymphocytes Relative: 5 %
Lymphs Abs: 0.5 10*3/uL — ABNORMAL LOW (ref 0.7–4.0)
MCH: 29.2 pg (ref 26.0–34.0)
MCHC: 29.8 g/dL — ABNORMAL LOW (ref 30.0–36.0)
MCV: 98.1 fL (ref 80.0–100.0)
Monocytes Absolute: 0.5 10*3/uL (ref 0.1–1.0)
Monocytes Relative: 5 %
Neutro Abs: 8.6 10*3/uL — ABNORMAL HIGH (ref 1.7–7.7)
Neutrophils Relative %: 88 %
Platelets: 342 10*3/uL (ref 150–400)
RBC: 3.18 MIL/uL — ABNORMAL LOW (ref 4.22–5.81)
RDW: 15.4 % (ref 11.5–15.5)
WBC: 9.8 10*3/uL (ref 4.0–10.5)
nRBC: 0.4 % — ABNORMAL HIGH (ref 0.0–0.2)

## 2021-03-07 LAB — MAGNESIUM: Magnesium: 1.8 mg/dL (ref 1.7–2.4)

## 2021-03-07 LAB — BRAIN NATRIURETIC PEPTIDE: B Natriuretic Peptide: 173.2 pg/mL — ABNORMAL HIGH (ref 0.0–100.0)

## 2021-03-07 LAB — PHOSPHORUS: Phosphorus: 7.3 mg/dL — ABNORMAL HIGH (ref 2.5–4.6)

## 2021-03-07 LAB — LACTIC ACID, PLASMA: Lactic Acid, Venous: 1.6 mmol/L (ref 0.5–1.9)

## 2021-03-07 LAB — URINE CULTURE: Culture: NO GROWTH

## 2021-03-07 LAB — PROCALCITONIN: Procalcitonin: 1.01 ng/mL

## 2021-03-07 LAB — PROTIME-INR
INR: 1.8 — ABNORMAL HIGH (ref 0.8–1.2)
Prothrombin Time: 20 seconds — ABNORMAL HIGH (ref 11.4–15.2)

## 2021-03-07 LAB — CORTISOL-AM, BLOOD: Cortisol - AM: 42.6 ug/dL — ABNORMAL HIGH (ref 6.7–22.6)

## 2021-03-07 SURGERY — CARDIOVERSION
Anesthesia: Choice

## 2021-03-07 SURGERY — PERICARDIOCENTESIS
Anesthesia: Choice

## 2021-03-07 MED ORDER — SODIUM CHLORIDE 0.9% FLUSH
3.0000 mL | INTRAVENOUS | Status: DC | PRN
Start: 2021-03-07 — End: 2021-03-07

## 2021-03-07 MED ORDER — METOPROLOL TARTRATE 5 MG/5ML IV SOLN
2.5000 mg | INTRAVENOUS | Status: AC
  Administered 2021-03-07: 2.5 mg via INTRAVENOUS
  Filled 2021-03-07: qty 5

## 2021-03-07 MED ORDER — INSULIN ASPART 100 UNIT/ML ~~LOC~~ SOLN
0.0000 [IU] | SUBCUTANEOUS | Status: DC
  Administered 2021-03-07: 3 [IU] via SUBCUTANEOUS
  Administered 2021-03-07: 2 [IU] via SUBCUTANEOUS
  Administered 2021-03-07: 1 [IU] via SUBCUTANEOUS
  Administered 2021-03-07 – 2021-03-08 (×3): 2 [IU] via SUBCUTANEOUS
  Administered 2021-03-08: 1 [IU] via SUBCUTANEOUS
  Administered 2021-03-09: 2 [IU] via SUBCUTANEOUS
  Administered 2021-03-09: 1 [IU] via SUBCUTANEOUS
  Administered 2021-03-09 (×2): 2 [IU] via SUBCUTANEOUS
  Administered 2021-03-09 – 2021-03-10 (×5): 1 [IU] via SUBCUTANEOUS
  Administered 2021-03-10: 2 [IU] via SUBCUTANEOUS
  Filled 2021-03-07 (×14): qty 1

## 2021-03-07 MED ORDER — FENTANYL CITRATE (PF) 100 MCG/2ML IJ SOLN
INTRAMUSCULAR | Status: AC
  Filled 2021-03-07: qty 2

## 2021-03-07 MED ORDER — PHENYLEPHRINE CONCENTRATED 100MG/250ML (0.4 MG/ML) INFUSION SIMPLE
0.0000 ug/min | INTRAVENOUS | Status: DC
  Administered 2021-03-07: 20 ug/min via INTRAVENOUS
  Administered 2021-03-08: 75 ug/min via INTRAVENOUS
  Administered 2021-03-09: 40 ug/min via INTRAVENOUS
  Administered 2021-03-10: 225 ug/min via INTRAVENOUS
  Administered 2021-03-11: 200 ug/min via INTRAVENOUS
  Administered 2021-03-11: 250 ug/min via INTRAVENOUS
  Administered 2021-03-11: 280 ug/min via INTRAVENOUS
  Administered 2021-03-12: 200 ug/min via INTRAVENOUS
  Filled 2021-03-07 (×10): qty 250

## 2021-03-07 MED ORDER — SODIUM CHLORIDE 0.9 % WEIGHT BASED INFUSION: 3.0000 mL/kg/h | INTRAVENOUS | Status: DC

## 2021-03-07 MED ORDER — MIDAZOLAM HCL 2 MG/2ML IJ SOLN: 3.0000 mg | Freq: Once | INTRAMUSCULAR | Status: AC

## 2021-03-07 MED ORDER — LACTATED RINGERS IV BOLUS
250.0000 mL | Freq: Once | INTRAVENOUS | Status: AC
  Administered 2021-03-07: 250 mL via INTRAVENOUS

## 2021-03-07 MED ORDER — AMIODARONE LOAD VIA INFUSION
150.0000 mg | Freq: Once | INTRAVENOUS | Status: AC
  Administered 2021-03-07: 150 mg via INTRAVENOUS

## 2021-03-07 MED ORDER — SODIUM CHLORIDE 0.9% FLUSH
3.0000 mL | Freq: Two times a day (BID) | INTRAVENOUS | Status: DC
  Administered 2021-03-07 – 2021-03-11 (×9): 3 mL via INTRAVENOUS

## 2021-03-07 MED ORDER — SODIUM CHLORIDE 0.9 % IV SOLN
250.0000 mL | INTRAVENOUS | Status: DC | PRN
Start: 2021-03-07 — End: 2021-03-07

## 2021-03-07 MED ORDER — MIDAZOLAM HCL 2 MG/2ML IJ SOLN
INTRAMUSCULAR | Status: AC
  Filled 2021-03-07: qty 2

## 2021-03-07 MED ORDER — MIDAZOLAM HCL 2 MG/2ML IJ SOLN
INTRAMUSCULAR | Status: AC
  Filled 2021-03-07: qty 4

## 2021-03-07 MED ORDER — GUAIFENESIN ER 600 MG PO TB12
600.0000 mg | ORAL_TABLET | Freq: Two times a day (BID) | ORAL | Status: DC | PRN
  Administered 2021-03-07 – 2021-03-08 (×2): 600 mg via ORAL
  Filled 2021-03-07 (×2): qty 1

## 2021-03-07 MED ORDER — MIDAZOLAM HCL 2 MG/2ML IJ SOLN: 2.0000 mg | Freq: Once | INTRAMUSCULAR | Status: DC

## 2021-03-07 MED ORDER — ASPIRIN 81 MG PO CHEW: 81.0000 mg | CHEWABLE_TABLET | ORAL | Status: DC

## 2021-03-07 MED ORDER — HEPARIN BOLUS VIA INFUSION
5000.0000 [IU] | Freq: Once | INTRAVENOUS | Status: AC
  Administered 2021-03-07: 5000 [IU] via INTRAVENOUS
  Filled 2021-03-07: qty 5000

## 2021-03-07 MED ORDER — HYDROCORTISONE 1 % EX CREA
1.0000 "application " | TOPICAL_CREAM | Freq: Three times a day (TID) | CUTANEOUS | Status: DC | PRN
  Filled 2021-03-07: qty 28

## 2021-03-07 MED ORDER — FENTANYL CITRATE (PF) 100 MCG/2ML IJ SOLN: 100.0000 ug | Freq: Once | INTRAMUSCULAR | Status: DC

## 2021-03-07 MED ORDER — HEPARIN (PORCINE) 25000 UT/250ML-% IV SOLN
1400.0000 [IU]/h | INTRAVENOUS | Status: DC
  Administered 2021-03-07 – 2021-03-08 (×2): 1400 [IU]/h via INTRAVENOUS
  Filled 2021-03-07 (×3): qty 250

## 2021-03-07 MED ORDER — VASOPRESSIN 20 UNITS/100 ML INFUSION FOR SHOCK
0.0000 [IU]/min | INTRAVENOUS | Status: DC
  Administered 2021-03-07: 0.03 [IU]/min via INTRAVENOUS
  Administered 2021-03-07 – 2021-03-08 (×2): 0.04 [IU]/min via INTRAVENOUS
  Filled 2021-03-07 (×4): qty 100

## 2021-03-07 MED ORDER — MIDAZOLAM HCL 2 MG/2ML IJ SOLN
INTRAMUSCULAR | Status: DC | PRN
  Administered 2021-03-07: 0.5 mg via INTRAVENOUS

## 2021-03-07 MED ORDER — MAGNESIUM SULFATE 2 GM/50ML IV SOLN
2.0000 g | Freq: Once | INTRAVENOUS | Status: AC
  Administered 2021-03-07: 2 g via INTRAVENOUS
  Filled 2021-03-07: qty 50

## 2021-03-07 MED ORDER — LIDOCAINE HCL 1 % IJ SOLN
10.0000 mL | Freq: Once | INTRAMUSCULAR | Status: AC
  Administered 2021-03-07: 10 mL via INTRADERMAL

## 2021-03-07 MED ORDER — SODIUM CHLORIDE 0.9 % WEIGHT BASED INFUSION: 1.0000 mL/kg/h | INTRAVENOUS | Status: DC

## 2021-03-07 MED ORDER — LIDOCAINE HCL (PF) 2 % IJ SOLN
0.0000 mL | Freq: Once | INTRAMUSCULAR | Status: DC | PRN
  Filled 2021-03-07: qty 20

## 2021-03-07 MED ORDER — MIDAZOLAM HCL 2 MG/2ML IJ SOLN
INTRAMUSCULAR | Status: AC
  Administered 2021-03-07: 3 mg via INTRAVENOUS
  Filled 2021-03-07: qty 4

## 2021-03-07 MED ORDER — FENTANYL CITRATE (PF) 100 MCG/2ML IJ SOLN
INTRAMUSCULAR | Status: DC | PRN
  Administered 2021-03-07: 12.5 ug via INTRAVENOUS

## 2021-03-07 SURGICAL SUPPLY — 6 items
CATH DAWSON-MUELLER 8.25X25 (STENTS) IMPLANT
DRAPE BRACHIAL (DRAPES) ×2 IMPLANT
GUIDEWIRE AMPLATZ SHORT (WIRE) ×2 IMPLANT
PACK CARDIAC CATH (CUSTOM PROCEDURE TRAY) ×2 IMPLANT
PERIVAC PERICARDIOCENTESIS 8.3 (TRAY / TRAY PROCEDURE) ×4 IMPLANT
WIRE GUIDERIGHT .035X150 (WIRE) ×2 IMPLANT

## 2021-03-07 NOTE — Progress Notes (Addendum)
After informed consent from patient and wife, patient was sedated with Versed IV 4 mg for sedation was given a synchronized biphasic shock at 200 J in an emergent situation due to profoundly symptomatic atrial fibrillation.  He converted to sinus rhythm.  Somewhat lethargic post procedure but nonfocal.  We will anticoagulate with IV heparin until able to get back on Eliquis.

## 2021-03-07 NOTE — Progress Notes (Addendum)
PHARMACY CONSULT NOTE - FOLLOW UP  Pharmacy Consult for Electrolyte Monitoring and Replacement   Recent Labs: Potassium (mmol/L)  Date Value  02/21/2021 4.0  01/10/2015 4.3   Magnesium (mg/dL)  Date Value  03/13/2021 1.8  06/22/2014 1.7 (L)   Calcium (mg/dL)  Date Value  02/13/2021 7.0 (L)   Calcium, Total (mg/dL)  Date Value  01/10/2015 9.2   Albumin (g/dL)  Date Value  02/21/2021 3.3 (L)  06/22/2014 3.2 (L)   Phosphorus (mg/dL)  Date Value  02/21/2021 7.3 (H)  06/22/2014 2.7   Sodium (mmol/L)  Date Value  03/01/2021 136  01/10/2015 142   Assessment: [name] is a 75 y.o. male admitted on 02/24/2021 with sepsis thought to be secondary to UTI. Pt is on HD MWF. Pharmacy has been consulted to assist in electrolyte monitoring and replacement as indicated.  Corrected calcium: 7.8  Goal of Therapy:  K: 4 - 5.1  Mg: 2 - 2.4 All other electrolytes within normal limits   Plan:  - Phos 7.3 - continue to monitor. Patient scheduled for dialysis 3/25. Expect hyperphosphatemia to improve with dialysis. - Mg 1.8 - NP ordered 2g x 1 dose  - Monitor electrolytes with AM lab  [name] , Student- PharmD 03/09/2021 3:33 PM

## 2021-03-07 NOTE — CV Procedure (Signed)
Pericardiocentesis  Indication moderate pericardial effusion with hypotension Referred by Dr. Ubaldo Glassing  Patient was brought to the cardiac Cath Lab for pericardiocentesis Patient was placed on the cath table with around 30 degrees elevation Procedure was sterile Patient received lidocaine subxiphoid The pericardiocentesis needle was used to access the pericardium Dark red fluid appeared to look like blood was removed without difficulty We were able to withdraw close to 300 cc of pericardial fluid Procedure was performed under ultrasound guidance Postprocedure echocardiogram was performed which showed little or no pericardial effusion Postop CXR was also performed to be sure there was no pneumothorax Patient tolerated procedure well No complications He was transferred back to intensive care unit Healthy service was transferred back to Dr. Ubaldo Glassing

## 2021-03-07 NOTE — Progress Notes (Signed)
OT Cancellation Note  Patient Details Name: Uzziel Russey MRN: [id number] DOB: 1946/11/19   Cancelled Treatment:    Reason Eval/Treat Not Completed: Other (comment). Chart reviewed.  Patient noted with transfer to CCU overnight due to decline in medical status.  Per guidelines, will require new orders to resume services.  Please re-consult as medically appropriate  Dessie Coma, M.S. OTR/L  02/28/2021, 8:26 AM  ascom [phone]

## 2021-03-07 NOTE — Progress Notes (Signed)
Order obtained to place diet order.

## 2021-03-07 NOTE — Procedures (Signed)
Central Venous Catheter Insertion Procedure Note  Gay Rape  [id number]  Oct 30, 1946  Date:03/11/2021  Time:7:40 AM   Provider Performing: Shauna Hugh   Procedure: Insertion of Non-tunneled Central Venous [phone]) with US guidance (74451)   Indication(s) Medication administration and Difficult access  Consent Risks of the procedure as well as the alternatives and risks of each were explained to the patient and/or caregiver.  Consent for the procedure was obtained and is signed in the bedside chart  Anesthesia Topical only with 1% lidocaine   Timeout Verified patient identification, verified procedure, site/side was marked, verified correct patient position, special equipment/implants available, medications/allergies/relevant history reviewed, required imaging and test results available.  Sterile Technique Maximal sterile technique including full sterile barrier drape, hand hygiene, sterile gown, sterile gloves, mask, hair covering, sterile ultrasound probe cover (if used).  Procedure Description Area of catheter insertion was cleaned with chlorhexidine and draped in sterile fashion.  With real-time ultrasound guidance a central venous catheter was placed into the left femoral vein. Nonpulsatile blood flow and easy flushing noted in all ports.  The catheter was sutured in place and sterile dressing applied.  Complications/Tolerance None; patient tolerated the procedure well. Chest X-ray is ordered to verify placement for internal jugular or subclavian cannulation.   Chest x-ray is not ordered for femoral cannulation.  EBL Minimal  Specimen(s) None  Attempted to place left internal jugular central line, however unsuccessful.  Therefore, proceeded with left femoral central line placement.  Marda Stalker, [place] Pager [phone] (please enter 7 digits) PCCM Consult Pager [phone] (please enter 7 digits)

## 2021-03-07 NOTE — Progress Notes (Signed)
NAME:  [name], MRN:  [id number], DOB:  07-04-1946, LOS: 2 ADMISSION DATE:  02/12/2021, CONSULTATION DATE: 03/06/2021 REFERRING MD: Sharion Settler, NP, CHIEF COMPLAINT: Hypotension    History of Present Illness:  This is a 75 yo male who presented to [facility] ER via EMS on 03/22 from home via EMS with c/o shortness of breath, fatigue, and weakness onset several weeks priors to presentation.  He has a hx of ESRD requiring HD M-W-F, last HD session 03/21, however unable to complete the full session due to hypotension. At baseline he voids some although in the last few days he has been unable to void. He reports he has had weakness and shortness of breath after he had a clogged dialysis catheter that required catheter lysis on 03/9.  He subsequently presented to the ER on 03/10 with chest pain.  CTA Chest performed which was negative for PE although dilated pulmonary arteries were noted along with a small pericardial effusion, and 3-vessel coronary atherosclerosis.  During that presentation bp marginal spb 90's.  ER physician discussed the case with on call Nephrologist Dr. Harvest Dark who advised he would monitor the pt closely during his scheduled HD session the next day, and pt did not require hospital admission.  ED course Upon arrival to the ER EKG revealed atrial flutter vs. atrial fibrillation with rvr.  Pt also hypotensive and diaphoretic.  He subsequently received low dose etomidate and underwent synchronized cardioversion which was successful cardiac rhythm sinus rhythm.  Pts hypotension improved post cardioversion.  CXR concerning for pericardial effusion, left pleural effusion, and left lower lobe airspace disease. UA concerning for UTI. Abx therapy initiated. ER physician consulted cardiology and nephrology. Hospitalist team admitted pt to the medsurg unit for additional workup and treatment.   Hospital course On 03/23 pt required transfer to the stepdown unit with wide complex  tachycardia hr. 140-150's and hypotension sbp 60-70's.  He required amiodarone bolus followed by continuous gtt and peripheral levophed.  PCCM team consulted to assist with management.    Pertinent  Medical History  OSA Renal Cell Carcinoma HTN HLD Type II Diabetes Mellitus Depression COPD Chronic Back Pain ESRD HD M-W-F  Significant Hospital Events: Including procedures, antibiotic start and stop dates in addition to other pertinent events   03/22: Pt presented to [facility] ER with weakness and shortness of breath.  Found to be hypotensive and cardiac rhythm atrial fibrillation with rvr.  Underwent successful cardioversion with improvement in bp 03/23: Pt admitted to the medsurg unit per hospitalist team for additional workup and treatment 03/23: Pt required transfer to the stepdown unit with wide complex tachycardia and hypotension requiring levophed and amiodarone gtts. PCCM team consulted to assist with management  Interim History / Subjective:  Pt c/o mild shortness of breath and cough   Objective   Blood pressure (!) 100/58, pulse 71, temperature (S) 99.5 F (37.5 C), temperature source (S) Oral, resp. rate (!) 25, height 6' (1.829 m), weight 115.9 kg, SpO2 92 %.        Intake/Output Summary (Last 24 hours) at 02/23/2021 1744 Last data filed at 03/01/2021 1700 Gross per 24 hour  Intake 1909.58 ml  Output 1000 ml  Net 909.58 ml   Filed Weights   02/23/2021 1326 03/06/21 0621 03/07/21 1030  Weight: 113.4 kg 117.7 kg 115.9 kg   Examination: General: acutely ill appearing male, resting in bed NAD  HENT: supple, no JVD  Lungs: diminished throughout, even, non labored  Cardiovascular: sinus tachycardia, no R/G,  2+ radial/1+distal pulses, trace bilateral lower extremity edema  Abdomen: +BS x4, obese, soft, non distended, non tender  Extremities: left leg bulk larger which is his baseline, moves all extremities  Neuro: alert and oriented, follows commands GU: currently anuric    Labs/imaging that I havepersonally reviewed   03/23: BUN 72, creatinine 7.12, calcium 6.7, anion gap 16, phosphorous 9.0, albumin 3.1, lactic acid 2.2, pct 1.05, BNP 173.2, hgb 9.4, TSH 3.513, free T4 0.81, respiratory panel by rt-pcr negative, and UA concerning for UTI   CXR concerning for cardiomegaly, vascular congestion, small bilateral effusions, and left base atelectasis   Resolved Hospital Problem list   N/A  Assessment & Plan:   Acute on chronic respiratory failure secondary to vascular congestion in the setting of ESRD LLL atelectasis  Hx: COPD and OSA Prn supplemental O2 for dyspnea and/or hypoxia  Aggressive pulmonary hygiene Prn bronchodilator therapy  HD per nephrology recommendations   Cardiovascular: Wide complex tachycardia  Atrial fibrillation with rvr s/p synchronized cardioversion initially on converted to sinus rhythm 03/22 Echo 03/23~moderate pericardial effusion no evidence of cardiac tamponade Mildly elevated troponin likely demand ischemia  Hypotension likely cardiogenic shock  Hx: HTN and HLD Rate control with amio and planned cardioversion Pressors converted to neo/vaso Plan for pericardiocentesis, must sent for cytology given history Do not feel hemodynamics and dysthymias are from sepsis (not febrile, no left shift, anticipated PCT)  ESRD on HD M-W-F  Hx: Renal cell carcinoma  Trend BMP  Replace electrolytes as indicated  Avoid nephrotoxic medications  Nephrology consulted appreciate input Pt does occasionally void although has voided in several days will bladder scan to assess for urinary retention   UTI Trend WBC and monitor fever curve Trend PCT  Follow cultures  Continue zosyn for now   Type II diabetes mellitus  Monitor serum glucose If glucose >150 will start SSI  Depression  Continue remeron and cymbalta    Best practice (evaluated daily)  Diet:  Oral Pain/Anxiety/Delirium protocol (if indicated): No VAP protocol (if  indicated): Not indicated DVT prophylaxis: Systemic AC GI prophylaxis: PPI Glucose control:  SSI No Central venous access:  N/A Arterial line:  N/A Foley:  N/A Mobility:  bed rest  PT consulted: N/A Last date of multidisciplinary goals of care discussion [N/A] Code Status:  full code Disposition: ICU  Labs   CBC: Recent Labs  Lab 03/14/2021 1325 03/06/21 0548 03/06/21 2101 03/05/2021 0420  WBC 8.1 9.2 7.4 9.8  NEUTROABS 6.7 7.4 5.9 8.6*  HGB 9.3* 9.3* 9.4* 9.3*  HCT 30.2* 30.8* 31.2* 31.2*  MCV 95.3 97.5 97.5 98.1  PLT 300 295 279 267    Basic Metabolic Panel: Recent Labs  Lab 02/19/2021 1325 03/06/21 0548 02/28/2021 0420  NA 137 138 136  K 3.7 4.0 4.0  CL 96* 97* 96*  CO2 25 25 25   GLUCOSE 204* 111* 265*  BUN 64* 72* 54*  CREATININE 6.23* 7.12* 5.85*  CALCIUM 7.0* 6.7* 7.0*  MG  --  1.8 1.8  PHOS  --  9.0* 7.3*   GFR: Estimated Creatinine Clearance: 15 mL/min (A) (by C-G formula based on SCr of 5.85 mg/dL (H)). Recent Labs  Lab 02/16/2021 1325 03/06/21 0548 03/06/21 2101 03/06/21 2324 03/03/2021 0420  PROCALCITON  --   --  1.05  --  1.01  WBC 8.1 9.2 7.4  --  9.8  LATICACIDVEN  --   --  2.2* 1.6  --     Liver Function Tests: Recent Labs  Lab  02/16/2021 1325 03/06/21 0548 02/15/2021 0420  AST 142* 74* 104*  ALT 110* 98* 128*  ALKPHOS 88 83 91  BILITOT 0.9 0.9 0.9  PROT 6.4* 6.6 6.6  ALBUMIN 3.0* 3.1* 3.3*   No results for input(s): LIPASE, AMYLASE in the last 168 hours. No results for input(s): AMMONIA in the last 168 hours.  ABG No results found for: PHART, PCO2ART, PO2ART, HCO3, TCO2, ACIDBASEDEF, O2SAT   Coagulation Profile: Recent Labs  Lab 02/27/2021 1051  INR 1.8*    Cardiac Enzymes: No results for input(s): CKTOTAL, CKMB, CKMBINDEX, TROPONINI in the last 168 hours.  HbA1C: Hemoglobin A1C  Date/Time Value Ref Range Status  01/27/2014 05:31 AM 5.5 4.2 - 6.3 % Final    Comment:    The American Diabetes Association recommends that a  primary goal of therapy should be <7% and that physicians should reevaluate the treatment regimen in patients with HbA1c values consistently >8%.    Hgb A1c MFr Bld  Date/Time Value Ref Range Status  03/06/2021 05:48 AM 5.7 (H) 4.8 - 5.6 % Final    Comment:    (NOTE) Pre diabetes:          5.7%-6.4%  Diabetes:              >6.4%  Glycemic control for   <7.0% adults with diabetes   11/08/2020 04:39 AM 5.6 4.8 - 5.6 % Final    Comment:    (NOTE) Pre diabetes:          5.7%-6.4%  Diabetes:              >6.4%  Glycemic control for   <7.0% adults with diabetes     CBG: Recent Labs  Lab 02/27/2021 2004 03/06/21 1952 02/27/2021 1122 03/06/2021 1543  GLUCAP 92 93 225* 165*    Review of Systems: Positives in BOLD   Gen: fever, chills, weight change, fatigue, night sweats HEENT: Denies blurred vision, double vision, hearing loss, tinnitus, sinus congestion, rhinorrhea, sore throat, neck stiffness, dysphagia PULM: shortness of breath, cough, sputum production, hemoptysis, wheezing CV: Denies chest pain, edema, orthopnea, paroxysmal nocturnal dyspnea, palpitations GI: Denies abdominal pain, nausea, vomiting, diarrhea, hematochezia, melena, constipation, change in bowel habits GU: Denies dysuria, hematuria, polyuria, oliguria, urethral discharge Endocrine: Denies hot or cold intolerance, polyuria, polyphagia or appetite change Derm: Denies rash, dry skin, scaling or peeling skin change Heme: Denies easy bruising, bleeding, bleeding gums Neuro: headache, numbness, weakness, slurred speech, loss of memory or consciousness   Past Medical History:  He,  has a past medical history of Chronic back pain, COPD (chronic obstructive pulmonary disease) ([place]), Depression, Diabetes mellitus without complication ([place]), Hyperlipidemia, Hypertension, Renal cell carcinoma ([place]), and Sleep apnea.   Surgical History:   Past Surgical History:  Procedure Laterality Date  . CARPAL TUNNEL RELEASE     . CHOLECYSTECTOMY    . COLONOSCOPY WITH PROPOFOL N/A 07/29/2017   Procedure: COLONOSCOPY WITH PROPOFOL;  Surgeon: Manya Silvas, MD;  Location: [facility] ENDOSCOPY;  Service: Endoscopy;  Laterality: N/A;  . colonoscopys    . DIALYSIS/PERMA CATHETER INSERTION N/A 10/09/2020   Procedure: DIALYSIS/PERMA CATHETER INSERTION;  Surgeon: Katha Cabal, MD;  Location: [facility];  Service: Cardiovascular;  Laterality: N/A;  . ESOPHAGOGASTRODUODENOSCOPY (EGD) WITH PROPOFOL N/A 07/29/2017   Procedure: ESOPHAGOGASTRODUODENOSCOPY (EGD) WITH PROPOFOL;  Surgeon: Manya Silvas, MD;  Location: [facility] ENDOSCOPY;  Service: Endoscopy;  Laterality: N/A;  . FLEXIBLE SIGMOIDOSCOPY    . GASTRIC BYPASS    .  NEPHRECTOMY    . open reduction and internal fixation, right distal radius    . right knee arthroscopy    . TEMPORARY DIALYSIS CATHETER N/A 10/02/2020   Procedure: TEMPORARY DIALYSIS CATHETER;  Surgeon: Katha Cabal, MD;  Location: [facility];  Service: Cardiovascular;  Laterality: N/A;  . UPPER GI ENDOSCOPY       Social History:   reports that he has quit smoking. He has a 30.00 pack-year smoking history. His smokeless tobacco use includes chew. He reports that he does not drink alcohol and does not use drugs.   Family History:  His family history includes Cancer in his mother; Diabetes in his mother; Heart disease in his father.   Allergies Allergies  Allergen Reactions  . Bupropion     Other reaction(s): Other (See Comments) Mood swings, angry Other reaction(s): Other (See Comments) Mood swings, angry     Home Medications  Prior to Admission medications   Medication Sig Start Date End Date Taking? Authorizing Provider  apixaban (ELIQUIS) 2.5 MG TABS tablet Take 1 tablet (2.5 mg total) by mouth 2 (two) times daily. 10/12/20  Yes Swayze, Ava, DO  carvedilol (COREG) 6.25 MG tablet Take 6.25 mg by mouth 2 (two) times daily with a meal.   Yes [provider]   DULoxetine (CYMBALTA) 60 MG capsule Take 120 mg by mouth daily.  09/01/19  Yes [provider]  ezetimibe (ZETIA) 10 MG tablet Take by mouth. 11/22/20 11/22/21 Yes [provider]  glipiZIDE (GLUCOTROL XL) 5 MG 24 hr tablet Take 5 mg by mouth daily. 07/25/20  Yes [provider]  mirtazapine (REMERON) 45 MG tablet Take 45 mg by mouth at bedtime.  07/05/20  Yes [provider]  multivitamin (RENA-VIT) TABS tablet Take 1 tablet by mouth at bedtime. 10/12/20  Yes Swayze, Ava, DO  pantoprazole (PROTONIX) 40 MG tablet Take 40 mg by mouth 2 (two) times daily.    Yes [provider]  rOPINIRole (REQUIP) 4 MG tablet Take 1 tablet (4 mg total) by mouth 2 (two) times daily before a meal. 10/12/20  Yes Swayze, Ava, DO  rosuvastatin (CRESTOR) 10 MG tablet Take 1 tablet (10 mg total) by mouth daily. 11/09/20  Yes Pokhrel, Laxman, MD  sucralfate (CARAFATE) 1 g tablet Take 1 g by mouth 3 (three) times daily.    Yes [provider]     Marda Stalker, [place] Pager [phone] (please enter 7 digits) PCCM Consult Pager [phone] (please enter 7 digits)

## 2021-03-07 NOTE — Progress Notes (Signed)
Patient returned to room.  Cardiac monitor in place.  VSS.  Patient denies pain at this time.  Patient alert and oriented x 4 and requesting something to drink and food.  Dr. Ubaldo Glassing notified of request.

## 2021-03-07 NOTE — Progress Notes (Signed)
[place], [place] 03/05/2021  Subjective:   LOS: 2  [name] is a 75 year old male currently followed by our office, Dr. Holley Raring.  He has a past medical history of ESRD on HD MWF, history of DVT on Eliquis, essential hypertension, chronic anxiety and depression, type 2 diabetes, and hyperlipidemia.  He presents to the emergency room with progressive shortness of breath and weakness for 5 days.  He was found to have a flutter on arrival to the ED with a heart rate of 160 and low BP.    -Developed unstable atrial fibrillation overnight.  Transferred to the intensive care unit.  Underwent cardioversion.  Now on normal sinus rhythm.  Case discussed with Dr. Ubaldo Glassing.  Patient did have dialysis yesterday.  Objective:  Vital signs in last 24 hours:  Temp:  [98.1 F (36.7 C)-98.6 F (37 C)] 98.6 F (37 C) (03/24 0000) Pulse Rate:  [29-148] 115 (03/24 0300) Resp:  [18-36] 19 (03/24 0700) BP: (75-120)/(53-82) 90/77 (03/24 0700) SpO2:  [76 %-100 %] 100 % (03/24 0300)  Weight change:  Filed Weights   03/04/2021 1326 03/06/21 0621  Weight: 113.4 kg 117.7 kg    Intake/Output:    Intake/Output Summary (Last 24 hours) at 02/27/2021 0940 Last data filed at 02/19/2021 0700 Gross per 24 hour  Intake 2008.09 ml  Output 1000 ml  Net 1008.09 ml     Physical Exam: General: Under sedation  HEENT Normocephalic. Atraumatic, moist oral mucosa  Pulm/lungs Minimal basilar rales  CVS/Heart Regular  Abdomen:  Soft, non tender  Extremities: trace peripheral edema  Neurologic: Under sedation  Skin: No acute skin rash  Access: Rt IJ Permcth       Basic Metabolic Panel:  Recent Labs  Lab 02/18/2021 1325 03/06/21 0548 03/06/2021 0420  NA 137 138 136  K 3.7 4.0 4.0  CL 96* 97* 96*  CO2 25 25 25   GLUCOSE 204* 111* 265*  BUN 64* 72* 54*  CREATININE 6.23* 7.12* 5.85*  CALCIUM 7.0* 6.7* 7.0*  MG  --  1.8 1.8  PHOS  --  9.0* 7.3*     CBC: Recent Labs  Lab  02/28/2021 1325 03/06/21 0548 03/06/21 2101 02/20/2021 0420  WBC 8.1 9.2 7.4 9.8  NEUTROABS 6.7 7.4 5.9 8.6*  HGB 9.3* 9.3* 9.4* 9.3*  HCT 30.2* 30.8* 31.2* 31.2*  MCV 95.3 97.5 97.5 98.1  PLT 300 295 279 342      Lab Results  Component Value Date   HEPBSAG NON REACTIVE 10/02/2020   HEPBSAG NON REACTIVE 10/02/2020   HEPBSAB NON REACTIVE 10/02/2020   HEPBIGM NON REACTIVE 10/02/2020      Microbiology:  Recent Results (from the past 240 hour(s))  Urine Culture     Status: None   Collection Time: 02/13/2021  2:51 PM   Specimen: Urine, Random  Result Value Ref Range Status   Specimen Description   Final    URINE, RANDOM Performed at [facility], [address], [place], [place] [address]    Special Requests   Final    NONE Performed at [facility], [address], [place], [place] [address]    Culture   Final    NO GROWTH Performed at [facility], [place] [address], [place], [place] [address]    Report Status 02/16/2021 FINAL  Final  Resp Panel by RT-PCR (Flu A&B, Covid) Nasopharyngeal Swab     Status: None   Collection Time: 02/27/2021  3:45 PM  Specimen: Nasopharyngeal Swab; Nasopharyngeal(NP) swabs in vial transport medium  Result Value Ref Range Status   SARS Coronavirus 2 by RT PCR NEGATIVE NEGATIVE Final    Comment: (NOTE) SARS-CoV-2 target nucleic acids are NOT DETECTED.  The SARS-CoV-2 RNA is generally detectable in upper respiratory specimens during the acute phase of infection. The lowest concentration of SARS-CoV-2 viral copies this assay can detect is 138 copies/mL. A negative result does not preclude SARS-Cov-2 infection and should not be used as the sole basis for treatment or other patient management decisions. A negative result may occur with  improper specimen collection/handling, submission of specimen other than nasopharyngeal swab, presence of viral mutation(s) within the areas targeted by this assay, and inadequate number  of viral copies(<138 copies/mL). A negative result must be combined with clinical observations, patient history, and epidemiological information. The expected result is Negative.  Fact Sheet for Patients:  EntrepreneurPulse.com.au  Fact Sheet for Healthcare Providers:  IncredibleEmployment.be  This test is no t yet approved or cleared by the [place] FDA and  has been authorized for detection and/or diagnosis of SARS-CoV-2 by FDA under an Emergency Use Authorization (EUA). This EUA will remain  in effect (meaning this test can be used) for the duration of the COVID-19 declaration under Section 564(b)(1) of the Act, 21 U.S.C.section 360bbb-3(b)(1), unless the authorization is terminated  or revoked sooner.       Influenza A by PCR NEGATIVE NEGATIVE Final   Influenza B by PCR NEGATIVE NEGATIVE Final    Comment: (NOTE) The Xpert Xpress SARS-CoV-2/FLU/RSV plus assay is intended as an aid in the diagnosis of influenza from Nasopharyngeal swab specimens and should not be used as a sole basis for treatment. Nasal washings and aspirates are unacceptable for Xpert Xpress SARS-CoV-2/FLU/RSV testing.  Fact Sheet for Patients: EntrepreneurPulse.com.au  Fact Sheet for Healthcare Providers: IncredibleEmployment.be  This test is not yet approved or cleared by the [place] FDA and has been authorized for detection and/or diagnosis of SARS-CoV-2 by FDA under an Emergency Use Authorization (EUA). This EUA will remain in effect (meaning this test can be used) for the duration of the COVID-19 declaration under Section 564(b)(1) of the Act, 21 U.S.C. section 360bbb-3(b)(1), unless the authorization is terminated or revoked.  Performed at [facility], [address] [place], [place] [address]   CULTURE, BLOOD (ROUTINE X 2) w Reflex to ID Panel     Status: None (Preliminary result)   Collection Time:  02/16/2021  6:17 PM   Specimen: BLOOD  Result Value Ref Range Status   Specimen Description BLOOD BLOOD RIGHT HAND  Final   Special Requests   Final    BOTTLES DRAWN AEROBIC AND ANAEROBIC Blood Culture adequate volume   Culture   Final    NO GROWTH 2 DAYS Performed at [facility], [address] [place], [place] [address]    Report Status PENDING  Incomplete  CULTURE, BLOOD (ROUTINE X 2) w Reflex to ID Panel     Status: None (Preliminary result)   Collection Time: 03/04/2021  6:18 PM   Specimen: BLOOD  Result Value Ref Range Status   Specimen Description BLOOD RIGHT ANTECUBITAL  Final   Special Requests   Final    BOTTLES DRAWN AEROBIC AND ANAEROBIC Blood Culture adequate volume   Culture   Final    NO GROWTH 2 DAYS Performed at [facility], [address], [place], [place] [address]    Report Status PENDING  Incomplete  MRSA PCR  Screening     Status: None   Collection Time: 03/06/21  9:20 AM   Specimen: Nasopharyngeal  Result Value Ref Range Status   MRSA by PCR NEGATIVE NEGATIVE Final    Comment:        The GeneXpert MRSA Assay (FDA approved for NASAL specimens only), is one component of a comprehensive MRSA colonization surveillance program. It is not intended to diagnose MRSA infection nor to guide or monitor treatment for MRSA infections. Performed at [facility], [address] [place], [place] [address]     Coagulation Studies: No results for input(s): LABPROT, INR in the last 72 hours.  Urinalysis: Recent Labs    03/01/2021 1451  COLORURINE AMBER*  LABSPEC 1.018  PHURINE 5.0  GLUCOSEU NEGATIVE  HGBUR NEGATIVE  BILIRUBINUR NEGATIVE  KETONESUR NEGATIVE  PROTEINUR 100*  NITRITE NEGATIVE  LEUKOCYTESUR MODERATE*      Imaging: DG Chest Port 1 View  Result Date: 03/06/2021 CLINICAL DATA:  Acute respiratory failure. EXAM: PORTABLE CHEST 1 VIEW COMPARISON:  03/06/2021.  02/23/2021.  CT 02/21/2021. FINDINGS: Dual-lumen  catheter noted with tip over cavoatrial junction in stable position. Stable cardiomegaly and pulmonary vascular congestion. Low lung volumes with persistent left base atelectasis. Persistent small left pleural effusion. Elevation left hemidiaphragm is most likely again present. No pneumothorax. IMPRESSION: 1. Dual-lumen catheter in stable position. 2. Stable cardiomegaly and pulmonary vascular congestion. 3. Low lung volumes with persistent left base atelectasis and small left pleural effusion. Electronically Signed   By: Marcello Moores  Register   On: 03/03/2021 05:24   DG Chest Port 1 View  Result Date: 03/06/2021 CLINICAL DATA:  Tachycardia EXAM: PORTABLE CHEST 1 VIEW COMPARISON:  03/13/2021 FINDINGS: Right dialysis catheter remains in place, unchanged. Cardiomegaly with vascular congestion. Left lower lobe opacity, likely atelectasis. Possible small left effusion. Possible small effusions. No overt edema or acute bony abnormality. IMPRESSION: Cardiomegaly, vascular congestion. Small bilateral effusions. Left base atelectasis. Electronically Signed   By: Rolm Baptise M.D.   On: 03/06/2021 21:02   DG Chest Port 1 View  Result Date: 03/11/2021 CLINICAL DATA:  Short of breath and edema.  Missed dialysis. EXAM: PORTABLE CHEST 1 VIEW COMPARISON:  02/21/2021 FINDINGS: Marked cardiac enlargement with progression. Possible pericardial effusion. Vascular congestion. Negative for edema. Interval development of small to moderate left pleural effusion and left lower lobe atelectasis. Dialysis catheter in the lower SVC unchanged. IMPRESSION: Interval development marked cardiac enlargement suggesting pericardial effusion Interval development of left pleural effusion and left lower lobe airspace disease. Electronically Signed   By: Franchot Gallo M.D.   On: 02/24/2021 14:42   ECHOCARDIOGRAM COMPLETE  Result Date: 03/06/2021    ECHOCARDIOGRAM REPORT   Patient Name:   [name] Date of Exam: 03/06/2021 Medical Rec  #:  [id number]             Height:       72.0 in Accession #:    [id number]            Weight:       259.5 lb Date of Birth:  May 08, 1946            BSA:          2.380 m Patient Age:    28 years              BP:           114/79 mmHg Patient Gender: M  HR:           90 bpm. Exam Location:  [facility] Procedure: 2D Echo, Cardiac Doppler and Color Doppler Indications:     Pericardial effusion I31.3  History:         Patient has prior history of Echocardiogram examinations, most                  recent 11/08/2020. Risk Factors:Hypertension and Diabetes.  Sonographer:     Sherrie Sport RDCS (AE) Referring Phys:  [id number] [place] Diagnosing Phys: Bartholome Bill MD  Sonographer Comments: Global longitudinal strain was attempted. IMPRESSIONS  1. Left ventricular ejection fraction, by estimation, is 60 to 65%. The left ventricle has normal function. The left ventricle has no regional wall motion abnormalities. Left ventricular diastolic parameters were normal.  2. Right ventricular systolic function is normal. The right ventricular size is mildly enlarged.  3. Moderate pericardial effusion. The pericardial effusion is circumferential. There is no evidence of cardiac tamponade.  4. The mitral valve is grossly normal. Trivial mitral valve regurgitation.  5. The aortic valve was not well visualized. Aortic valve regurgitation is trivial. FINDINGS  Left Ventricle: Left ventricular ejection fraction, by estimation, is 60 to 65%. The left ventricle has normal function. The left ventricle has no regional wall motion abnormalities. The left ventricular internal cavity size was normal in size. There is  borderline left ventricular hypertrophy. Left ventricular diastolic parameters were normal. Right Ventricle: The right ventricular size is mildly enlarged. No increase in right ventricular wall thickness. Right ventricular systolic function is normal. Left Atrium: Left atrial size was normal in size. Right Atrium: Right  atrial size was normal in size. Pericardium: A moderately sized pericardial effusion is present. The pericardial effusion is circumferential. There is no evidence of cardiac tamponade. Mitral Valve: The mitral valve is grossly normal. Trivial mitral valve regurgitation. Tricuspid Valve: The tricuspid valve is grossly normal. Tricuspid valve regurgitation is mild. Aortic Valve: The aortic valve was not well visualized. Aortic valve regurgitation is trivial. Aortic valve mean gradient measures 2.0 mmHg. Aortic valve peak gradient measures 3.6 mmHg. Aortic valve area, by VTI measures 6.81 cm. Pulmonic Valve: The pulmonic valve was not well visualized. Pulmonic valve regurgitation is trivial. Aorta: The aortic root is normal in size and structure. IAS/Shunts: The interatrial septum was not assessed.  LEFT VENTRICLE PLAX 2D LVIDd:         3.77 cm  Diastology LVIDs:         2.50 cm  LV e' medial:    4.46 cm/s LV PW:         1.61 cm  LV E/e' medial:  15.0 LV IVS:        1.28 cm  LV e' lateral:   5.77 cm/s LVOT diam:     2.20 cm  LV E/e' lateral: 11.6 LV SV:         85 LV SV Index:   36 LVOT Area:     3.80 cm  RIGHT VENTRICLE RV Basal diam:  3.60 cm RV S prime:     14.70 cm/s TAPSE (M-mode): 4.5 cm LEFT ATRIUM             Index       RIGHT ATRIUM           Index LA diam:        3.50 cm 1.47 cm/m  RA Area:     30.60 cm LA Vol (A2C):   90.1 ml 37.86  ml/m RA Volume:   106.00 ml 44.54 ml/m LA Vol (A4C):   78.9 ml 33.15 ml/m LA Biplane Vol: 87.7 ml 36.85 ml/m  AORTIC VALVE                   PULMONIC VALVE AV Area (Vmax):    4.54 cm    PV Vmax:        0.57 m/s AV Area (Vmean):   4.31 cm    PV Peak grad:   1.3 mmHg AV Area (VTI):     6.81 cm    RVOT Peak grad: 2 mmHg AV Vmax:           94.70 cm/s AV Vmean:          59.200 cm/s AV VTI:            0.125 m AV Peak Grad:      3.6 mmHg AV Mean Grad:      2.0 mmHg LVOT Vmax:         113.00 cm/s LVOT Vmean:        67.100 cm/s LVOT VTI:          0.224 m LVOT/AV VTI ratio: 1.79   AORTA Ao Root diam: 3.43 cm MITRAL VALVE               TRICUSPID VALVE MV Area (PHT): 2.99 cm    TR Peak grad:   12.8 mmHg MV Decel Time: 254 msec    TR Vmax:        179.00 cm/s MV E velocity: 66.80 cm/s MV A velocity: 58.70 cm/s  SHUNTS MV E/A ratio:  1.14        Systemic VTI:  0.22 m                            Systemic Diam: 2.20 cm Bartholome Bill MD Electronically signed by Bartholome Bill MD Signature Date/Time: 03/06/2021/10:03:59 AM    Final      Medications:   . sodium chloride    . sodium chloride Stopped (02/19/2021 1751)  . amiodarone 60 mg/hr (02/27/2021 0700)  . heparin    . norepinephrine (LEVOPHED) Adult infusion 2 mcg/Curtis (02/27/2021 0443)  . phenylephrine (NEO-SYNEPHRINE) Adult infusion 20 mcg/Curtis (02/15/2021 0653)  . piperacillin-tazobactam (ZOSYN)  IV Stopped (03/10/2021 0622)  . vasopressin 0.04 Units/Curtis (03/03/2021 0700)   . Chlorhexidine Gluconate Cloth  6 each Topical Q0600  . DULoxetine  120 mg Oral Daily  . epoetin (EPOGEN/PROCRIT) injection  4,000 Units Intravenous Q M,W,F-HD  . ezetimibe  10 mg Oral Daily  . heparin  5,000 Units Intravenous Once  . insulin aspart  0-9 Units Subcutaneous Q4H  . mouth rinse  15 mL Mouth Rinse BID  . midazolam      . midodrine  10 mg Oral Q M,W,F-HD  . mirtazapine  45 mg Oral QHS  . pantoprazole  40 mg Oral BID  . rOPINIRole  4 mg Oral BID AC  . rosuvastatin  10 mg Oral QHS  . sodium chloride flush  3 mL Intravenous Q12H  . sucralfate  1 g Oral TID   acetaminophen, clonazepam, guaiFENesin, lidocaine HCl (PF)  Assessment/ Plan:  75 y.o. male with past medical history of ESRD on HD MWF, history of DVT on Eliquis, essential hypertension, chronic anxiety and depression, type 2 diabetes, and hyperlipidemia was admitted on 03/14/2021   Active Problems:   UTI (urinary tract infection)  UTI (urinary  tract infection) [N39.0] Acute cystitis without hematuria [N30.00] Generalized weakness [R53.1] Atypical atrial flutter ([place]) [I48.4]  [facility]  [address]/MWF/ Rt IJ Permcath  #. ESRD on HD Patient underwent hemodialysis yesterday.  Has been having hypotension.  Known to have pericardial effusion now.  May be playing some role in hypotension.  Patient to go for pericardiocentesis later today.  #. Anemia of CKD  Lab Results  Component Value Date   HGB 9.3 (L) 03/10/2021   Administer Epogen 4000 units IV with dialysis tomorrow.  #. Secondary hyperparathyroidism of renal origin N 25.81   No results found for: PTH Lab Results  Component Value Date   PHOS 7.3 (H) 03/13/2021   Phosphorus high at 7.3.  Repeat tomorrow.  Consider starting patient on either Renvela and calcium acetate tomorrow.  #. Diabetes type 2 with CKD Hemoglobin A1C (%)  Date Value  01/27/2014 5.5   Hgb A1c MFr Bld (%)  Date Value  03/06/2021 5.7 (H)  Stable with diet . # Hypotension/pericardial effusion.  Midodrine added to his regimen.  Also has known pericardial effusion.  Patient for pericardiocentesis today.  This may help with his hypotension.   LOS: 2 Arvis Zwahlen 3/24/20229:40 AM  [facility] [place], [place]

## 2021-03-07 NOTE — Progress Notes (Signed)
*  PRELIMINARY RESULTS* Echocardiogram 2D Echocardiogram has been performed.  [name] 02/12/2021, 3:01 PM

## 2021-03-07 NOTE — Progress Notes (Signed)
09:08 Patient sedated with 3mg  of versed. 09:12 patient cardioverted with 200J.  Dr. Hadley Pen with respiratory, Adalberto Ill, RN and primary nurse at bedside.  Patient converted to normal sinus rhythm.  Post cardioversion EKG completed and placed on chart.

## 2021-03-07 NOTE — Progress Notes (Signed)
Patient transported to cath lab for pericardial centesis by primary nurse and [facility] nurse Danae Chen.  Verbal report given to Ashley,RN.

## 2021-03-07 NOTE — Progress Notes (Deleted)
PHARMACY CONSULT NOTE - FOLLOW UP  Pharmacy Consult for Electrolyte Monitoring and Replacement   Recent Labs: Potassium (mmol/L)  Date Value  02/26/2021 4.0  01/10/2015 4.3   Magnesium (mg/dL)  Date Value  03/04/2021 1.8  06/22/2014 1.7 (L)   Calcium (mg/dL)  Date Value  03/03/2021 7.0 (L)   Calcium, Total (mg/dL)  Date Value  01/10/2015 9.2   Albumin (g/dL)  Date Value  02/15/2021 3.3 (L)  06/22/2014 3.2 (L)   Phosphorus (mg/dL)  Date Value  02/16/2021 7.3 (H)  06/22/2014 2.7   Sodium (mmol/L)  Date Value  02/28/2021 136  01/10/2015 142   Assessment: [name] is a 75 y.o. male admitted on 03/05/2021 with sepsis thought to be secondary to UTI. Pt is on HD MWF. Pharmacy has been consulted to assist in electrolyte monitoring and replacement as indicated.  Corrected calcium: 7.8  Goal of Therapy:  Electrolytes within normal limits   Plan:  - Phos 7.3 - continue to monitor. Patient scheduled for dialysis 3/25. Expect hyperphosphatemia to correct with dialysis. - Mg 1.8 - NP ordered 2g x 1 dose  - Monitor electrolytes with AM lab  [name] , Student- PharmD 03/01/2021 11:34 AM

## 2021-03-07 NOTE — Progress Notes (Signed)
Patient Name: [name] Date of Encounter: 02/14/2021  Hospital Problem List     Active Problems:   UTI (urinary tract infection)    Patient Profile      75 y.o.malewith history ofCOPD, diabetes, hypertension, hyperlipidemia, end-stage renal disease on hemodialysis who presented to emergency room with shortness of breath and weakness. For the last several days he has had worsening weakness. He has been short of breath. He underwent dialysis on Friday but had to stop early due to hypotension. He could not go to dialysis yesterday due to weakness. He has been fatigued and short of breath. In the emergency room he was noted to be hypotensive and was in A. fib with RVR. Due to rapid A. fib and hypertension he underwent cardioversion with conversion to sinus arrhythmia. He is on carvedilol 6.25 mg twice daily and apixaban as an outpatient. Initial EKG showed atrial fibrillation with RVR. After cardioversion he was in sinus rhythm with right bundle branch block. Chest x-ray revealed cardiomegaly. Left pleural effusion. CT of the chest on 02/21/2021 revealed a small pericardial effusion. Blood pressure improved this am. Echo revealed moderate pericardial effusion with no evidence of hemodynamic compromise.   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  He remains in atrial for flutter and is relatively hemodynamically unstable with this.  We will proceed with urgent cardioversion to attempt to back in sinus rhythm.  We will also consider proceeding with pericardiocentesis as this appears to be a factor and getting him back in A. fib.  Subjective   Patient is alert and oriented but somewhat hypotensive.  We will proceed with cardioversion.  Converted we will continue with amiodarone.  Will remain off Eliquis and on heparin and proceed with pericardiocentesis later on  today.  Inpatient Medications    . Chlorhexidine Gluconate Cloth  6 each Topical Q0600  . DULoxetine  120 mg Oral Daily  . epoetin (EPOGEN/PROCRIT) injection  4,000 Units Intravenous Q M,W,F-HD  . ezetimibe  10 mg Oral Daily  . heparin  5,000 Units Intravenous Once  . insulin aspart  0-9 Units Subcutaneous Q4H  . mouth rinse  15 mL Mouth Rinse BID  . midodrine  10 mg Oral Q M,W,F-HD  . mirtazapine  45 mg Oral QHS  . pantoprazole  40 mg Oral BID  . rOPINIRole  4 mg Oral BID AC  . rosuvastatin  10 mg Oral QHS  . sodium chloride flush  3 mL Intravenous Q12H  . sucralfate  1 g Oral TID    Vital Signs    Vitals:   03/10/2021 0400 03/02/2021 0500 03/01/2021 0600 02/24/2021 0700  BP: (!) 88/63 (!) 87/60 (!) 80/68 90/77  Pulse:      Resp: (!) 21 20 (!) 27 19  Temp:      TempSrc:      SpO2:      Weight:      Height:        Intake/Output Summary (Last 24 hours) at 02/26/2021 0848 Last data filed at 02/18/2021 0700 Gross per 24 hour  Intake 2008.09 ml  Output 1000 ml  Net 1008.09 ml   Filed Weights   03/11/2021 1326 03/06/21 0621  Weight: 113.4 kg 117.7 kg    Physical Exam    GEN: Well nourished, well developed, in no acute distress.  HEENT: normal.  Neck: Supple, no JVD, carotid bruits, or masses. Cardiac:  RRR, no murmurs, rubs, or gallops. No clubbing, cyanosis, edema.  Radials/DP/PT 2+ and equal bilaterally.  Respiratory:  Respirations regular and unlabored, clear to auscultation bilaterally. GI: Soft, nontender, nondistended, BS + x 4. MS: no deformity or atrophy. Skin: warm and dry, no rash. Neuro:  Strength and sensation are intact. Psych: Normal affect.  Labs    CBC Recent Labs    03/06/21 2101 02/14/2021 0420  WBC 7.4 9.8  NEUTROABS 5.9 8.6*  HGB 9.4* 9.3*  HCT 31.2* 31.2*  MCV 97.5 98.1  PLT 279 496   Basic Metabolic Panel Recent Labs    03/06/21 0548 03/06/2021 0420  NA 138 136  K 4.0 4.0  CL 97* 96*  CO2 25 25  GLUCOSE 111* 265*  BUN 72* 54*   CREATININE 7.12* 5.85*  CALCIUM 6.7* 7.0*  MG 1.8 1.8  PHOS 9.0* 7.3*   Liver Function Tests Recent Labs    03/06/21 0548 03/09/2021 0420  AST 74* 104*  ALT 98* 128*  ALKPHOS 83 91  BILITOT 0.9 0.9  PROT 6.6 6.6  ALBUMIN 3.1* 3.3*   No results for input(s): LIPASE, AMYLASE in the last 72 hours. Cardiac Enzymes No results for input(s): CKTOTAL, CKMB, CKMBINDEX, TROPONINI in the last 72 hours. BNP Recent Labs    02/12/2021 1325 03/06/21 2324  BNP 530.6* 173.2*   D-Dimer No results for input(s): DDIMER in the last 72 hours. Hemoglobin A1C Recent Labs    03/06/21 0548  HGBA1C 5.7*   Fasting Lipid Panel No results for input(s): CHOL, HDL, LDLCALC, TRIG, CHOLHDL, LDLDIRECT in the last 72 hours. Thyroid Function Tests Recent Labs    03/06/21 2101  TSH 3.513    Telemetry    Atrial fib with rapid ventricular response  ECG       Radiology    DG Chest 2 View  Result Date: 02/21/2021 CLINICAL DATA:  Chest pain EXAM: CHEST - 2 VIEW COMPARISON:  October 07, 2020 FINDINGS: Central catheter tip in superior vena cava near the cavoatrial junction. No pneumothorax. There is left base atelectasis. Lungs elsewhere are clear. Heart is slightly enlarged with pulmonary vascularity normal. No adenopathy. No bone lesions. IMPRESSION: Central catheter tip in superior vena cava near the cavoatrial junction. No pneumothorax. Left base atelectasis. No edema or airspace opacity. Mild cardiac prominence. Electronically Signed   By: Lowella Grip III M.D.   On: 02/21/2021 13:49   CT Angio Chest PE W and/or Wo Contrast  Result Date: 02/21/2021 CLINICAL DATA:  Right upper chest pain post dialysis catheter flushing last night. EXAM: CT ANGIOGRAPHY CHEST WITH CONTRAST TECHNIQUE: Multidetector CT imaging of the chest was performed using the standard protocol during bolus administration of intravenous contrast. Multiplanar CT image reconstructions and MIPs were obtained to evaluate the  vascular anatomy. CONTRAST:  19mL OMNIPAQUE IOHEXOL 350 MG/ML SOLN COMPARISON:  Chest radiograph from earlier today. 01/28/2014 chest CT. FINDINGS: Cardiovascular: The study is high quality for the evaluation of pulmonary embolism. There are no filling defects in the central, lobar, segmental or subsegmental pulmonary artery branches to suggest acute pulmonary embolism. Normal course and caliber of the thoracic aorta. Dilated main pulmonary artery (3.8 cm diameter). Normal heart size. Small pericardial effusion. Three-vessel coronary atherosclerosis. Right internal jugular central venous catheter terminates in the lower third of the SVC. Mediastinum/Nodes: No discrete thyroid nodules. Unremarkable esophagus. No pathologically enlarged axillary, mediastinal or hilar lymph nodes. Lungs/Pleura: No pneumothorax. No pleural effusion. No acute consolidative airspace disease, lung masses or significant pulmonary nodules.  Platelike parenchymal bands at the left greater than right lung bases compatible with scarring or atelectasis. 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 Musculoskeletal: No aggressive appearing focal osseous lesions. Mild thoracic spondylosis. Review of the MIP images confirms the above findings. IMPRESSION: 1. No pulmonary embolism. 2. Dilated main pulmonary artery, suggesting pulmonary arterial hypertension. 3. Small pericardial effusion. 4. Three-vessel coronary atherosclerosis. 5. Platelike parenchymal bands at the left greater than right lung bases, compatible with scarring or atelectasis. Electronically Signed   By: Ilona Sorrel M.D.   On: 02/21/2021 15:45   DG Chest Port 1 View  Result Date: 02/26/2021 CLINICAL DATA:  Acute respiratory failure. EXAM: PORTABLE CHEST 1 VIEW COMPARISON:  03/06/2021.  03/09/2021.  CT 02/21/2021. FINDINGS: Dual-lumen catheter noted with tip over cavoatrial junction in stable position. Stable cardiomegaly and  pulmonary vascular congestion. Low lung volumes with persistent left base atelectasis. Persistent small left pleural effusion. Elevation left hemidiaphragm is most likely again present. No pneumothorax. IMPRESSION: 1. Dual-lumen catheter in stable position. 2. Stable cardiomegaly and pulmonary vascular congestion. 3. Low lung volumes with persistent left base atelectasis and small left pleural effusion. Electronically Signed   By: Marcello Moores  Register   On: 02/19/2021 05:24   DG Chest Port 1 View  Result Date: 03/06/2021 CLINICAL DATA:  Tachycardia EXAM: PORTABLE CHEST 1 VIEW COMPARISON:  03/06/2021 FINDINGS: Right dialysis catheter remains in place, unchanged. Cardiomegaly with vascular congestion. Left lower lobe opacity, likely atelectasis. Possible small left effusion. Possible small effusions. No overt edema or acute bony abnormality. IMPRESSION: Cardiomegaly, vascular congestion. Small bilateral effusions. Left base atelectasis. Electronically Signed   By: Rolm Baptise M.D.   On: 03/06/2021 21:02   DG Chest Port 1 View  Result Date: 02/18/2021 CLINICAL DATA:  Short of breath and edema.  Missed dialysis. EXAM: PORTABLE CHEST 1 VIEW COMPARISON:  02/21/2021 FINDINGS: Marked cardiac enlargement with progression. Possible pericardial effusion. Vascular congestion. Negative for edema. Interval development of small to moderate left pleural effusion and left lower lobe atelectasis. Dialysis catheter in the lower SVC unchanged. IMPRESSION: Interval development marked cardiac enlargement suggesting pericardial effusion Interval development of left pleural effusion and left lower lobe airspace disease. Electronically Signed   By: Franchot Gallo M.D.   On: 03/02/2021 14:42   ECHOCARDIOGRAM COMPLETE  Result Date: 03/06/2021    ECHOCARDIOGRAM REPORT   Patient Name:   [name] Date of Exam: 03/06/2021 Medical Rec #:  [id number]             Height:       72.0 in Accession #:    [id number]            Weight:        259.5 lb Date of Birth:  01-06-1946            BSA:          2.380 m Patient Age:    14 years              BP:           114/79 mmHg Patient Gender: M                     HR:           90 bpm. Exam Location:  [facility] Procedure: 2D Echo, Cardiac Doppler and Color Doppler Indications:     Pericardial effusion I31.3  History:         Patient has prior  history of Echocardiogram examinations, most                  recent 11/08/2020. Risk Factors:Hypertension and Diabetes.  Sonographer:     Sherrie Sport RDCS (AE) Referring Phys:  [id number] [place] Diagnosing Phys: Bartholome Bill MD  Sonographer Comments: Global longitudinal strain was attempted. IMPRESSIONS  1. Left ventricular ejection fraction, by estimation, is 60 to 65%. The left ventricle has normal function. The left ventricle has no regional wall motion abnormalities. Left ventricular diastolic parameters were normal.  2. Right ventricular systolic function is normal. The right ventricular size is mildly enlarged.  3. Moderate pericardial effusion. The pericardial effusion is circumferential. There is no evidence of cardiac tamponade.  4. The mitral valve is grossly normal. Trivial mitral valve regurgitation.  5. The aortic valve was not well visualized. Aortic valve regurgitation is trivial. FINDINGS  Left Ventricle: Left ventricular ejection fraction, by estimation, is 60 to 65%. The left ventricle has normal function. The left ventricle has no regional wall motion abnormalities. The left ventricular internal cavity size was normal in size. There is  borderline left ventricular hypertrophy. Left ventricular diastolic parameters were normal. Right Ventricle: The right ventricular size is mildly enlarged. No increase in right ventricular wall thickness. Right ventricular systolic function is normal. Left Atrium: Left atrial size was normal in size. Right Atrium: Right atrial size was normal in size. Pericardium: A moderately sized pericardial effusion is  present. The pericardial effusion is circumferential. There is no evidence of cardiac tamponade. Mitral Valve: The mitral valve is grossly normal. Trivial mitral valve regurgitation. Tricuspid Valve: The tricuspid valve is grossly normal. Tricuspid valve regurgitation is mild. Aortic Valve: The aortic valve was not well visualized. Aortic valve regurgitation is trivial. Aortic valve mean gradient measures 2.0 mmHg. Aortic valve peak gradient measures 3.6 mmHg. Aortic valve area, by VTI measures 6.81 cm. Pulmonic Valve: The pulmonic valve was not well visualized. Pulmonic valve regurgitation is trivial. Aorta: The aortic root is normal in size and structure. IAS/Shunts: The interatrial septum was not assessed.  LEFT VENTRICLE PLAX 2D LVIDd:         3.77 cm  Diastology LVIDs:         2.50 cm  LV e' medial:    4.46 cm/s LV PW:         1.61 cm  LV E/e' medial:  15.0 LV IVS:        1.28 cm  LV e' lateral:   5.77 cm/s LVOT diam:     2.20 cm  LV E/e' lateral: 11.6 LV SV:         85 LV SV Index:   36 LVOT Area:     3.80 cm  RIGHT VENTRICLE RV Basal diam:  3.60 cm RV S prime:     14.70 cm/s TAPSE (M-mode): 4.5 cm LEFT ATRIUM             Index       RIGHT ATRIUM           Index LA diam:        3.50 cm 1.47 cm/m  RA Area:     30.60 cm LA Vol (A2C):   90.1 ml 37.86 ml/m RA Volume:   106.00 ml 44.54 ml/m LA Vol (A4C):   78.9 ml 33.15 ml/m LA Biplane Vol: 87.7 ml 36.85 ml/m  AORTIC VALVE  PULMONIC VALVE AV Area (Vmax):    4.54 cm    PV Vmax:        0.57 m/s AV Area (Vmean):   4.31 cm    PV Peak grad:   1.3 mmHg AV Area (VTI):     6.81 cm    RVOT Peak grad: 2 mmHg AV Vmax:           94.70 cm/s AV Vmean:          59.200 cm/s AV VTI:            0.125 m AV Peak Grad:      3.6 mmHg AV Mean Grad:      2.0 mmHg LVOT Vmax:         113.00 cm/s LVOT Vmean:        67.100 cm/s LVOT VTI:          0.224 m LVOT/AV VTI ratio: 1.79  AORTA Ao Root diam: 3.43 cm MITRAL VALVE               TRICUSPID VALVE MV Area (PHT):  2.99 cm    TR Peak grad:   12.8 mmHg MV Decel Time: 254 msec    TR Vmax:        179.00 cm/s MV E velocity: 66.80 cm/s MV A velocity: 58.70 cm/s  SHUNTS MV E/A ratio:  1.14        Systemic VTI:  0.22 m                            Systemic Diam: 2.20 cm Bartholome Bill MD Electronically signed by Bartholome Bill MD Signature Date/Time: 03/06/2021/10:03:59 AM    Final     Assessment & Plan    1.  Pericardial effusion-does not appear to have tamponade physiology on echocardiogram however pericardial fluid may be causing his recurrent atrial fib flutter which is hemodynamically poorly tolerated.  Will attempt to cardiovert back to sinus rhythm and then proceed with pericardiocentesis.  2.  Atrial fib-Will attempt cardioversion back to sinus rhythm to improve hemodynamics.  3.  End-stage renal disease-on hemodialysis  4.  UTI-not felt to be septic.  Risk and benefits of these procedures were discussed with the patient and his wife.  Signed, Javier Docker Tashianna Broome MD 02/14/2021, 8:48 AM  Pager: [phone]

## 2021-03-07 NOTE — Progress Notes (Signed)
PT Cancellation Note  Patient Details Name: [name] MRN: [id number] DOB: 11-01-1946   Cancelled Treatment:    Reason Eval/Treat Not Completed: Medical issues which prohibited therapy (Chart reviewed for re-attempt evaluation.  Patient noted with transfer to CCU overnight due to decline in medical status.  Per guidelines, will require new orders to resume PT services.  Please re-consult as medically appropriate.)  Kristen H. Owens Shark, PT, DPT, NCS 02/19/2021, 8:17 AM [phone]

## 2021-03-07 NOTE — Progress Notes (Signed)
Met with patient this morning, there was a successful coordination with nursing staff to bring wife bedside, she is also hospitalized here.  

## 2021-03-07 NOTE — Consult Note (Signed)
ANTICOAGULATION CONSULT NOTE - Follow Up Consult  Pharmacy Consult for heparin Indication: atrial fibrillation  Allergies  Allergen Reactions  . Bupropion     Other reaction(s): Other (See Comments) Mood swings, angry Other reaction(s): Other (See Comments) Mood swings, angry    Patient Measurements: Height: 6' (182.9 cm) Weight: 115.9 kg (255 lb 8.2 oz) IBW/kg (Calculated) : 77.6 Heparin Dosing Weight: 101.9  Vital Signs: Temp: (P) 98.2 F (36.8 C) (03/24 0800) Temp Source: (P) Oral (03/24 0800) BP: 96/66 (03/24 1230) Pulse Rate: 58 (03/24 1230)  Labs: Recent Labs    03/04/2021 1325 02/22/2021 1545 03/06/21 0548 03/06/21 2101 03/04/2021 0420 02/19/2021 1051  HGB 9.3*  --  9.3* 9.4* 9.3*  --   HCT 30.2*  --  30.8* 31.2* 31.2*  --   PLT 300  --  295 279 342  --   LABPROT  --   --   --   --   --  20.0*  INR  --   --   --   --   --  1.8*  CREATININE 6.23*  --  7.12*  --  5.85*  --   TROPONINIHS 24* 27*  --   --   --   --     Estimated Creatinine Clearance: 15 mL/min (A) (by C-G formula based on SCr of 5.85 mg/dL (H)).   Medications:  Apixaban 2.5 mg BID prior to admission  Assessment: 75 yo M presented with respiratory distress with hypotension and afib with RVR, HR 160. Pt receives HD MWF. PMH includes COPD, depression, diabetes, HLD, and HTN. Pt with symptomatic afib on 3/24 - cardioverted and started on heparin gtt. Heparin gtt was stopped 3/24 while pt was in cath lab. Pharmacy consulted to restart and monitor heparin.   Goal of Therapy:  Heparin level 0.3-0.7 units/ml Monitor platelets by anticoagulation protocol: Yes   Plan:  Restart heparin infusion at 1400 units/hr Check aPTT level in 8 hours and daily while on heparin Will monitor via aPTT levels until level correlates with heparin level since pt was on Eliquis prior to admission Continue to monitor H&H and platelets  Benn Moulder, PharmD Pharmacy Resident  03/09/2021 3:24 PM

## 2021-03-08 ENCOUNTER — Inpatient Hospital Stay: Payer: Medicare Other

## 2021-03-08 ENCOUNTER — Encounter: Payer: Self-pay | Admitting: Internal Medicine

## 2021-03-08 DIAGNOSIS — I4891 Unspecified atrial fibrillation: Secondary | ICD-10-CM | POA: Diagnosis not present

## 2021-03-08 DIAGNOSIS — J9601 Acute respiratory failure with hypoxia: Secondary | ICD-10-CM

## 2021-03-08 DIAGNOSIS — I313 Pericardial effusion (noninflammatory): Secondary | ICD-10-CM | POA: Diagnosis not present

## 2021-03-08 LAB — HEPARIN LEVEL (UNFRACTIONATED): Heparin Unfractionated: 1.4 IU/mL — ABNORMAL HIGH (ref 0.30–0.70)

## 2021-03-08 LAB — ALBUMIN, FLUID (OTHER): Albumin, Body Fluid Other: 2.8 g/dL

## 2021-03-08 LAB — BASIC METABOLIC PANEL
Anion gap: 17 — ABNORMAL HIGH (ref 5–15)
BUN: 69 mg/dL — ABNORMAL HIGH (ref 8–23)
CO2: 23 mmol/L (ref 22–32)
Calcium: 6.6 mg/dL — ABNORMAL LOW (ref 8.9–10.3)
Chloride: 96 mmol/L — ABNORMAL LOW (ref 98–111)
Creatinine, Ser: 7.29 mg/dL — ABNORMAL HIGH (ref 0.61–1.24)
GFR, Estimated: 7 mL/min — ABNORMAL LOW (ref >60)
Glucose, Bld: 158 mg/dL — ABNORMAL HIGH (ref 70–99)
Potassium: 3.6 mmol/L (ref 3.5–5.1)
Sodium: 136 mmol/L (ref 135–145)

## 2021-03-08 LAB — CBC
HCT: 31 % — ABNORMAL LOW (ref 39.0–52.0)
Hemoglobin: 9.4 g/dL — ABNORMAL LOW (ref 13.0–17.0)
MCH: 29.4 pg (ref 26.0–34.0)
MCHC: 30.3 g/dL (ref 30.0–36.0)
MCV: 96.9 fL (ref 80.0–100.0)
Platelets: 286 10*3/uL (ref 150–400)
RBC: 3.2 MIL/uL — ABNORMAL LOW (ref 4.22–5.81)
RDW: 15.7 % — ABNORMAL HIGH (ref 11.5–15.5)
WBC: 8.7 10*3/uL (ref 4.0–10.5)
nRBC: 0.2 % (ref 0.0–0.2)

## 2021-03-08 LAB — GLUCOSE, CAPILLARY
Glucose-Capillary: 146 mg/dL — ABNORMAL HIGH (ref 70–99)
Glucose-Capillary: 160 mg/dL — ABNORMAL HIGH (ref 70–99)
Glucose-Capillary: 200 mg/dL — ABNORMAL HIGH (ref 70–99)
Glucose-Capillary: 86 mg/dL (ref 70–99)
Glucose-Capillary: 112 mg/dL — ABNORMAL HIGH (ref 70–99)

## 2021-03-08 LAB — APTT
aPTT: 61 seconds — ABNORMAL HIGH (ref 24–36)
aPTT: 62 seconds — ABNORMAL HIGH (ref 24–36)
aPTT: 69 seconds — ABNORMAL HIGH (ref 24–36)

## 2021-03-08 LAB — PROCALCITONIN: Procalcitonin: 1.56 ng/mL

## 2021-03-08 LAB — PROTEIN, BODY FLUID (OTHER): Total Protein, Body Fluid Other: 5.3 g/dL

## 2021-03-08 LAB — HEPATITIS B SURFACE ANTIGEN: Hepatitis B Surface Ag: NONREACTIVE

## 2021-03-08 LAB — PHOSPHORUS: Phosphorus: 8.7 mg/dL — ABNORMAL HIGH (ref 2.5–4.6)

## 2021-03-08 LAB — MAGNESIUM: Magnesium: 2.1 mg/dL (ref 1.7–2.4)

## 2021-03-08 LAB — T3: T3, Total: 46 ng/dL — ABNORMAL LOW (ref 71–180)

## 2021-03-08 MED ORDER — SODIUM CHLORIDE 0.9 % IV SOLN
2.0000 g | INTRAVENOUS | Status: DC
  Administered 2021-03-09 – 2021-03-11 (×3): 2 g via INTRAVENOUS
  Filled 2021-03-08: qty 2
  Filled 2021-03-08 (×2): qty 20

## 2021-03-08 MED ORDER — HEPARIN SODIUM (PORCINE) 1000 UNIT/ML IJ SOLN
3200.0000 [IU] | Freq: Once | INTRAMUSCULAR | Status: AC
  Administered 2021-03-08: 3200 [IU]

## 2021-03-08 MED ORDER — METOPROLOL TARTRATE 5 MG/5ML IV SOLN
2.5000 mg | Freq: Once | INTRAVENOUS | Status: AC
  Administered 2021-03-08: 2.5 mg via INTRAVENOUS
  Filled 2021-03-08: qty 5

## 2021-03-08 MED ORDER — HEPARIN BOLUS VIA INFUSION
1500.0000 [IU] | Freq: Once | INTRAVENOUS | Status: AC
  Administered 2021-03-08: 1500 [IU] via INTRAVENOUS
  Filled 2021-03-08: qty 1500

## 2021-03-08 MED ORDER — CALCIUM CARBONATE 1250 (500 CA) MG PO TABS
1000.0000 mg | ORAL_TABLET | Freq: Once | ORAL | Status: AC
  Administered 2021-03-08: 1000 mg via ORAL
  Filled 2021-03-08 (×2): qty 2

## 2021-03-08 MED ORDER — HEPARIN (PORCINE) 25000 UT/250ML-% IV SOLN
1800.0000 [IU]/h | INTRAVENOUS | Status: DC
  Administered 2021-03-08 – 2021-03-09 (×2): 1800 [IU]/h via INTRAVENOUS
  Filled 2021-03-08 (×2): qty 250

## 2021-03-08 MED ORDER — SEVELAMER CARBONATE 800 MG PO TABS
1600.0000 mg | ORAL_TABLET | Freq: Three times a day (TID) | ORAL | Status: DC
  Administered 2021-03-08 – 2021-03-09 (×5): 1600 mg via ORAL
  Filled 2021-03-08 (×4): qty 2

## 2021-03-08 MED ORDER — POTASSIUM CHLORIDE CRYS ER 20 MEQ PO TBCR
40.0000 meq | EXTENDED_RELEASE_TABLET | Freq: Once | ORAL | Status: AC
  Administered 2021-03-08: 40 meq via ORAL
  Filled 2021-03-08: qty 2

## 2021-03-08 NOTE — TOC Progression Note (Signed)
Transition of Care [facility]) - Progression Note    Patient Details  Name: [name] MRN: [id number] Date of Birth: [birth year]  Transition of Care [facility] CM/SW [place], [place] Phone Number:  [phone] 03/08/2021, 1:57 PM  Clinical Narrative:     CSW called patient's daughter [name] [phone], with update on patient.  CSW explained the role of TOC in patient care. Patient's spouse is also admitted to the hospital and Ms. [name] expressed concerns about the ability of her parents to care for themselves. Ms. [name] stated she has private care lined up for her parents with [place], and she wants to know if the recommendation is for SNF or home health to let the care agency know.  CSW stated I would only be able to call her about her father but that I would relay the ,essage to [name], so she could call Ms. [name] with an update on her mother.  Ms. [name] verbalized understanding.      Expected Discharge Plan and Services                                                 Social Determinants of Health (SDOH) Interventions    Readmission Risk Interventions Readmission Risk Prevention Plan 11/09/2020 10/08/2020 07/08/2019  Transportation Screening Complete Complete Complete  PCP or Specialist Appt within 3-5 Days - Complete Complete  HRI or Home Care Consult - Complete Complete  Social Work Consult for [place] Planning/Counseling - Complete Complete  Palliative Care Screening - Not Applicable Not Applicable  Medication Review Press photographer) Complete Complete Complete  PCP or Specialist appointment within 3-5 days of discharge Complete - -  [place] or Home Care Consult Complete - -  SW Recovery Care/Counseling Consult Complete - -  Palliative Care Screening Complete - -  Skilled Nursing Facility Complete - -  Some recent data might be hidden

## 2021-03-08 NOTE — Progress Notes (Signed)
Pt noted to have new onset tachycardia sustaining >125 bpm during shift change that started around 1800. Upon assessment pt also verbalized slight dyspnea, which he said became more pronounced after dialysis which ended at about 1900. Other vitals remain stable, Neosynephrine rate weaning started. Ongoing discussion with NP, 12L EKG and Chest Xray. Cardiology Dr. Clayborn Bigness consulted over the phone, informed of recent changes and EKG findings, he is agreeable to Lopressor 2.5 mg IV for HR >120 q2hrs prn as long as SBP remains > 95.

## 2021-03-08 NOTE — Consult Note (Addendum)
Pharmacy Antibiotic Note  [name] is a 75 y.o. male admitted on 03/07/2021 with sepsis thought to be secondary to UTI. Pt presented with respiratory distress, weakness, and low BP during HD. Additional complaints included fatigue, mild sputum production, and decreased urinary output. PMH includes COPD, diabetes, HLD, and HTN. Pt found to be in afib with RVR. Concern for infection contributing to low blood pressure and new arrhythmia. Pt underwent pericardiocentesis for pericardial effusion with removal up 300 to 500 cc of bloody fluid. Pharmacy has been consulted for zosyn dosing. Patient has ESRD on HD MWF.   D4 abx  Plan:  -Continue Zosyn 2.25 g IV q8h through 3/25 -Start ceftriaxone 3/26 to complete 7 days of abx -Monitor cxs, abx deescalation / LOT  Height: 6' (182.9 cm) Weight: 115.9 kg (255 lb 8.2 oz) IBW/kg (Calculated) : 77.6  Temp (24hrs), Avg:98.7 F (37.1 C), Min:98 F (36.7 C), Max:99.5 F (37.5 C)  Recent Labs  Lab 02/17/2021 1325 03/06/21 0548 03/06/21 2101 03/06/21 2324 03/04/2021 0420 03/08/21 0632  WBC 8.1 9.2 7.4  --  9.8 8.7  CREATININE 6.23* 7.12*  --   --  5.85* 7.29*  LATICACIDVEN  --   --  2.2* 1.6  --   --     Estimated Creatinine Clearance: 12 mL/min (A) (by C-G formula based on SCr of 7.29 mg/dL (H)).    Allergies  Allergen Reactions  . Bupropion     Other reaction(s): Other (See Comments) Mood swings, angry Other reaction(s): Other (See Comments) Mood swings, angry    Antimicrobials this admission: 3/22 Vancomycin x 1 3/22 cefepime >> 3/23 3/23 Zosyn >> 3/26 3/26 ceftriaxone >>  Dose adjustments this admission: N/A  Microbiology results: 3/22 BCx: NGTD 3/22 UCx: NG 3/23 MRSA PCR: negative 3/24 pericardial fluid: NGTD  Thank you for allowing pharmacy to be a part of this patient's care.  Benn Moulder, PharmD Pharmacy Resident  03/08/2021 1:19 PM

## 2021-03-08 NOTE — Consult Note (Addendum)
[place] for heparin Indication: atrial fibrillation  Allergies  Allergen Reactions  . Bupropion     Other reaction(s): Other (See Comments) Mood swings, angry Other reaction(s): Other (See Comments) Mood swings, angry    Patient Measurements: Height: 6' (182.9 cm) Weight: 115.9 kg (255 lb 8.2 oz) IBW/kg (Calculated) : 77.6 Heparin Dosing Weight: 101.9  Vital Signs: Temp: 98 F (36.7 C) (03/25 0400) BP: 99/59 (03/25 1100) Pulse Rate: 71 (03/25 1100)  Labs: Recent Labs    03/02/2021 1325 03/03/2021 1545 03/06/21 0548 03/06/21 2101 03/11/2021 0420 02/28/2021 1051 03/05/2021 2357 03/08/21 0632 03/08/21 1123  HGB 9.3*  --  9.3* 9.4* 9.3*  --   --  9.4*  --   HCT 30.2*  --  30.8* 31.2* 31.2*  --   --  31.0*  --   PLT 300  --  295 279 342  --   --  286  --   APTT  --   --   --   --   --   --  61*  --  62*  LABPROT  --   --   --   --   --  20.0*  --   --   --   INR  --   --   --   --   --  1.8*  --   --   --   HEPARINUNFRC  --   --   --   --   --   --   --  1.40*  --   CREATININE 6.23*  --  7.12*  --  5.85*  --   --  7.29*  --   TROPONINIHS 24* 27*  --   --   --   --   --   --   --     Estimated Creatinine Clearance: 12 mL/min (A) (by C-G formula based on SCr of 7.29 mg/dL (H)).   Medications:  Apixaban 2.5 mg BID prior to admission  Assessment: 75 yo M presented with respiratory distress with hypotension and afib with RVR, HR 160. Pt receives HD MWF. PMH includes COPD, depression, diabetes, HLD, and HTN. Pt with symptomatic afib on 3/24 - cardioverted and started on heparin gtt. Heparin gtt was stopped 3/24 while pt was in cath lab. Pharmacy consulted to restart and monitor heparin.   Goal of Therapy:  aPTT goal 66-102 Heparin level 0.3-0.7 units/ml Monitor platelets by anticoagulation protocol: Yes    3/24 2357 aPTT 61, subtherapeutic 3/25 1123 aPTT 62, subtherapeutic   Plan:  [place] 1500 units Increase heparin drip to 1800  units/hr Recheck aPTT at 2200 Will monitor via aPTT levels until level correlates with heparin level since pt was on Eliquis prior to admission. Plan to restart Eliquis in approximately 24 hrs pending no additional procedures. Continue to monitor H&H and platelets  Dorena Bodo, PharmD Clinical Pharmacist 03/08/2021 12:37 PM

## 2021-03-08 NOTE — Progress Notes (Signed)
NAME:  [name], MRN:  [id number], DOB:  Apr 24, 1946, LOS: 3 ADMISSION DATE:  02/15/2021, CONSULTATION DATE: 03/06/2021 REFERRING MD: Sharion Settler, NP, CHIEF COMPLAINT: Hypotension    History of Present Illness:  This is a 75 yo male who presented to [facility] ER via EMS on 03/22 from home via EMS with c/o shortness of breath, fatigue, and weakness onset several weeks priors to presentation.  He has a hx of ESRD requiring HD M-W-F, last HD session 03/21, however unable to complete the full session due to hypotension. At baseline he voids some although in the last few days he has been unable to void. He reports he has had weakness and shortness of breath after he had a clogged dialysis catheter that required catheter lysis on 03/9.  He subsequently presented to the ER on 03/10 with chest pain.  CTA Chest performed which was negative for PE although dilated pulmonary arteries were noted along with a small pericardial effusion, and 3-vessel coronary atherosclerosis.  During that presentation bp marginal spb 90's.  ER physician discussed the case with on call Nephrologist Dr. Harvest Dark who advised he would monitor the pt closely during his scheduled HD session the next day, and pt did not require hospital admission.  ED course Upon arrival to the ER EKG revealed atrial flutter vs. atrial fibrillation with rvr.  Pt also hypotensive and diaphoretic.  He subsequently received low dose etomidate and underwent synchronized cardioversion which was successful cardiac rhythm sinus rhythm.  Pts hypotension improved post cardioversion.  CXR concerning for pericardial effusion, left pleural effusion, and left lower lobe airspace disease. UA concerning for UTI. Abx therapy initiated. ER physician consulted cardiology and nephrology. Hospitalist team admitted pt to the medsurg unit for additional workup and treatment.   Hospital course On 03/23 pt required transfer to the stepdown unit with wide complex  tachycardia hr. 140-150's and hypotension sbp 60-70's.  He required amiodarone bolus followed by continuous gtt and peripheral levophed.  PCCM team consulted to assist with management.    Pertinent  Medical History  OSA Renal Cell Carcinoma HTN HLD Type II Diabetes Mellitus Depression COPD Chronic Back Pain ESRD HD M-W-F  Significant Hospital Events: Including procedures, antibiotic start and stop dates in addition to other pertinent events   03/22: Pt presented to [facility] ER with weakness and shortness of breath.  Found to be hypotensive and cardiac rhythm atrial fibrillation with rvr.  Underwent successful cardioversion with improvement in bp 03/23: Pt admitted to the medsurg unit per hospitalist team for additional workup and treatment 03/23: Pt required transfer to the stepdown unit with wide complex tachycardia and hypotension requiring levophed and amiodarone gtts. PCCM team consulted to assist with management  Interim History / Subjective:  Pt c/o mild shortness of breath and cough   Objective   Blood pressure (!) 99/52, pulse 77, temperature (!) 97.2 F (36.2 C), temperature source Oral, resp. rate (!) 22, height 6' (1.829 m), weight 115.9 kg, SpO2 99 %.        Intake/Output Summary (Last 24 hours) at 03/08/2021 1705 Last data filed at 03/08/2021 0600 Gross per 24 hour  Intake 1155 ml  Output --  Net 1155 ml   Filed Weights   02/21/2021 1326 03/06/21 0621 03/03/2021 1030  Weight: 113.4 kg 117.7 kg 115.9 kg   Examination: General: acutely ill appearing male, resting in bed NAD  HENT: supple, no JVD  Lungs: diminished throughout, even, non labored  Cardiovascular: sinus tachycardia, no R/G, 2+ radial/1+distal  pulses, trace bilateral lower extremity edema  Abdomen: +BS x4, obese, soft, non distended, non tender  Extremities: left leg bulk larger which is his baseline, moves all extremities  Neuro: alert and oriented, follows commands GU: currently anuric   Labs/imaging  that I havepersonally reviewed   03/23: BUN 72, creatinine 7.12, calcium 6.7, anion gap 16, phosphorous 9.0, albumin 3.1, lactic acid 2.2, pct 1.05, BNP 173.2, hgb 9.4, TSH 3.513, free T4 0.81, respiratory panel by rt-pcr negative, and UA concerning for UTI   CXR concerning for cardiomegaly, vascular congestion, small bilateral effusions, and left base atelectasis   Resolved Hospital Problem list   N/A  Assessment & Plan:   Acute on chronic respiratory failure secondary to vascular congestion in the setting of ESRD LLL atelectasis  Hx: COPD and OSA Prn supplemental O2 for dyspnea and/or hypoxia  Aggressive pulmonary hygiene Prn bronchodilator therapy  HD per nephrology recommendations Much improved today   Cardiovascular: Wide complex tachycardia  Atrial fibrillation with rvr s/p synchronized cardioversion initially on converted to sinus rhythm 03/22 Echo 03/23~moderate pericardial effusion no evidence of cardiac tamponade Mildly elevated troponin likely demand ischemia  Hypotension likely cardiogenic shock  Hx: HTN and HLD Rate control with amio and planned cardioversion Pressors converted to neo/vaso Plan for pericardiocentesis, must sent for cytology given history Do not feel hemodynamics and dysthymias are from sepsis (not febrile, no left shift, anticipated PCT) Continues to improve from yesterday   ESRD on HD M-W-F  Hx: Renal cell carcinoma  Trend BMP  Replace electrolytes as indicated  Avoid nephrotoxic medications  Nephrology consulted appreciate input Pt does occasionally void although has voided in several days will bladder scan to assess for urinary retention   UTI Trend WBC and monitor fever curve Trend PCT  Follow cultures  Continue zosyn for now   Type II diabetes mellitus  Monitor serum glucose If glucose >150 will start SSI  Depression  Continue remeron and cymbalta    Best practice (evaluated daily)  Diet:  Oral Pain/Anxiety/Delirium protocol  (if indicated): No VAP protocol (if indicated): Not indicated DVT prophylaxis: Systemic AC GI prophylaxis: PPI Glucose control:  SSI No Central venous access:  N/A Arterial line:  N/A Foley:  N/A Mobility:  bed rest  PT consulted: N/A Last date of multidisciplinary goals of care discussion [N/A] Code Status:  full code Disposition: ICU  Labs   CBC: Recent Labs  Lab 02/12/2021 1325 03/06/21 0548 03/06/21 2101 02/12/2021 0420 03/08/21 0632  WBC 8.1 9.2 7.4 9.8 8.7  NEUTROABS 6.7 7.4 5.9 8.6*  --   HGB 9.3* 9.3* 9.4* 9.3* 9.4*  HCT 30.2* 30.8* 31.2* 31.2* 31.0*  MCV 95.3 97.5 97.5 98.1 96.9  PLT 300 295 279 342 062    Basic Metabolic Panel: Recent Labs  Lab 02/24/2021 1325 03/06/21 0548 02/14/2021 0420 03/08/21 0632  NA 137 138 136 136  K 3.7 4.0 4.0 3.6  CL 96* 97* 96* 96*  CO2 25 25 25 23   GLUCOSE 204* 111* 265* 158*  BUN 64* 72* 54* 69*  CREATININE 6.23* 7.12* 5.85* 7.29*  CALCIUM 7.0* 6.7* 7.0* 6.6*  MG  --  1.8 1.8 2.1  PHOS  --  9.0* 7.3* 8.7*   GFR: Estimated Creatinine Clearance: 12 mL/min (A) (by C-G formula based on SCr of 7.29 mg/dL (H)). Recent Labs  Lab 03/06/21 0548 03/06/21 2101 03/06/21 2324 02/15/2021 0420 03/08/21 0632  PROCALCITON  --  1.05  --  1.01 1.56  WBC 9.2 7.4  --  9.8 8.7  LATICACIDVEN  --  2.2* 1.6  --   --     Liver Function Tests: Recent Labs  Lab 02/28/2021 1325 03/06/21 0548 03/10/2021 0420  AST 142* 74* 104*  ALT 110* 98* 128*  ALKPHOS 88 83 91  BILITOT 0.9 0.9 0.9  PROT 6.4* 6.6 6.6  ALBUMIN 3.0* 3.1* 3.3*   No results for input(s): LIPASE, AMYLASE in the last 168 hours. No results for input(s): AMMONIA in the last 168 hours.  ABG No results found for: PHART, PCO2ART, PO2ART, HCO3, TCO2, ACIDBASEDEF, O2SAT   Coagulation Profile: Recent Labs  Lab 03/05/2021 1051  INR 1.8*    Cardiac Enzymes: No results for input(s): CKTOTAL, CKMB, CKMBINDEX, TROPONINI in the last 168 hours.  HbA1C: Hemoglobin A1C  Date/Time  Value Ref Range Status  01/27/2014 05:31 AM 5.5 4.2 - 6.3 % Final    Comment:    The American Diabetes Association recommends that a primary goal of therapy should be <7% and that physicians should reevaluate the treatment regimen in patients with HbA1c values consistently >8%.    Hgb A1c MFr Bld  Date/Time Value Ref Range Status  03/06/2021 05:48 AM 5.7 (H) 4.8 - 5.6 % Final    Comment:    (NOTE) Pre diabetes:          5.7%-6.4%  Diabetes:              >6.4%  Glycemic control for   <7.0% adults with diabetes   11/08/2020 04:39 AM 5.6 4.8 - 5.6 % Final    Comment:    (NOTE) Pre diabetes:          5.7%-6.4%  Diabetes:              >6.4%  Glycemic control for   <7.0% adults with diabetes     CBG: Recent Labs  Lab 02/24/2021 2314 03/08/21 0329 03/08/21 0729 03/08/21 1105 03/08/21 1629  GLUCAP 149* 146* 160* 200* 86    Review of Systems: Positives in BOLD   Gen: fever, chills, weight change, fatigue, night sweats HEENT: Denies blurred vision, double vision, hearing loss, tinnitus, sinus congestion, rhinorrhea, sore throat, neck stiffness, dysphagia PULM: shortness of breath, cough, sputum production, hemoptysis, wheezing CV: Denies chest pain, edema, orthopnea, paroxysmal nocturnal dyspnea, palpitations GI: Denies abdominal pain, nausea, vomiting, diarrhea, hematochezia, melena, constipation, change in bowel habits GU: Denies dysuria, hematuria, polyuria, oliguria, urethral discharge Endocrine: Denies hot or cold intolerance, polyuria, polyphagia or appetite change Derm: Denies rash, dry skin, scaling or peeling skin change Heme: Denies easy bruising, bleeding, bleeding gums Neuro: headache, numbness, weakness, slurred speech, loss of memory or consciousness   Past Medical History:  He,  has a past medical history of Chronic back pain, COPD (chronic obstructive pulmonary disease) ([place]), Depression, Diabetes mellitus without complication ([place]), Hyperlipidemia,  Hypertension, Renal cell carcinoma ([place]), and Sleep apnea.   Surgical History:   Past Surgical History:  Procedure Laterality Date  . CARPAL TUNNEL RELEASE    . CHOLECYSTECTOMY    . COLONOSCOPY WITH PROPOFOL N/A 07/29/2017   Procedure: COLONOSCOPY WITH PROPOFOL;  Surgeon: Manya Silvas, MD;  Location: [facility] ENDOSCOPY;  Service: Endoscopy;  Laterality: N/A;  . colonoscopys    . DIALYSIS/PERMA CATHETER INSERTION N/A 10/09/2020   Procedure: DIALYSIS/PERMA CATHETER INSERTION;  Surgeon: Katha Cabal, MD;  Location: [place] CV LAB;  Service: Cardiovascular;  Laterality: N/A;  . ESOPHAGOGASTRODUODENOSCOPY (EGD) WITH PROPOFOL N/A 07/29/2017   Procedure: ESOPHAGOGASTRODUODENOSCOPY (EGD) WITH PROPOFOL;  Surgeon:  Manya Silvas, MD;  Location: [facility] ENDOSCOPY;  Service: Endoscopy;  Laterality: N/A;  . FLEXIBLE SIGMOIDOSCOPY    . GASTRIC BYPASS    . NEPHRECTOMY    . open reduction and internal fixation, right distal radius    . PERICARDIOCENTESIS N/A 02/21/2021   Procedure: PERICARDIOCENTESIS;  Surgeon: Yolonda Kida, MD;  Location: [place] [facility] LAB;  Service: Cardiovascular;  Laterality: N/A;  . right knee arthroscopy    . TEMPORARY DIALYSIS CATHETER N/A 10/02/2020   Procedure: TEMPORARY DIALYSIS CATHETER;  Surgeon: Katha Cabal, MD;  Location: [place] [facility] LAB;  Service: Cardiovascular;  Laterality: N/A;  . UPPER GI ENDOSCOPY       Social History:   reports that he has quit smoking. He has a 30.00 pack-year smoking history. His smokeless tobacco use includes chew. He reports that he does not drink alcohol and does not use drugs.   Family History:  His family history includes Cancer in his mother; Diabetes in his mother; Heart disease in his father.   Allergies Allergies  Allergen Reactions  . Bupropion     Other reaction(s): Other (See Comments) Mood swings, angry Other reaction(s): Other (See Comments) Mood swings, angry     Home Medications  Prior  to Admission medications   Medication Sig Start Date End Date Taking? Authorizing Provider  apixaban (ELIQUIS) 2.5 MG TABS tablet Take 1 tablet (2.5 mg total) by mouth 2 (two) times daily. 10/12/20  Yes Swayze, Ava, DO  carvedilol (COREG) 6.25 MG tablet Take 6.25 mg by mouth 2 (two) times daily with a meal.   Yes [provider]  DULoxetine (CYMBALTA) 60 MG capsule Take 120 mg by mouth daily.  09/01/19  Yes [provider]  ezetimibe (ZETIA) 10 MG tablet Take by mouth. 11/22/20 11/22/21 Yes [provider]  glipiZIDE (GLUCOTROL XL) 5 MG 24 hr tablet Take 5 mg by mouth daily. 07/25/20  Yes [provider]  mirtazapine (REMERON) 45 MG tablet Take 45 mg by mouth at bedtime.  07/05/20  Yes [provider]  multivitamin (RENA-VIT) TABS tablet Take 1 tablet by mouth at bedtime. 10/12/20  Yes Swayze, Ava, DO  pantoprazole (PROTONIX) 40 MG tablet Take 40 mg by mouth 2 (two) times daily.    Yes [provider]  rOPINIRole (REQUIP) 4 MG tablet Take 1 tablet (4 mg total) by mouth 2 (two) times daily before a meal. 10/12/20  Yes Swayze, Ava, DO  rosuvastatin (CRESTOR) 10 MG tablet Take 1 tablet (10 mg total) by mouth daily. 11/09/20  Yes Pokhrel, Laxman, MD  sucralfate (CARAFATE) 1 g tablet Take 1 g by mouth 3 (three) times daily.    Yes [provider]

## 2021-03-08 NOTE — Progress Notes (Signed)
Patient Name: [name] Date of Encounter: 03/08/2021  Hospital Problem List     Active Problems:   UTI (urinary tract infection)    Patient Profile         75 y.o.malewith history ofCOPD, diabetes, hypertension, hyperlipidemia, end-stage renal disease on hemodialysis who presented to emergency room with shortness of breath and weakness. For the last several days he has had worsening weakness. He has been short of breath. He underwent dialysis on Friday but had to stop early due to hypotension. He could not go to dialysis yesterday due to weakness. He has been fatigued and short of breath. In the emergency room he was noted to be hypotensive and was in A. fib with RVR. Due to rapid A. fib and hypertension he underwent cardioversion with conversion to sinus arrhythmia. He is on carvedilol 6.25 mg twice daily and apixaban as an outpatient. Initial EKG showed atrial fibrillation with RVR. After cardioversion he was in sinus rhythm with right bundle branch block. Chest x-ray revealed cardiomegaly. Left pleural effusion. CT of the chest on 02/21/2021 revealed a small pericardial effusion.Blood pressure improved this am. Echo revealed moderate pericardial effusion with no evidence of hemodynamic compromise.   Patient had recurrent atrial fibrillation with RVR with hypotension.  Was urgently cardioverted back to sinus rhythm in the ICU.  Due to continued at least moderate pericardial effusion as well as hypotension, patient underwent pericardiocentesis per Dr. Clayborn Bigness with removal approximately 3 to 500 cc of fluid.  Laboratories from this revealed 15,000/mm nucleated cells with 54% neutrophils 35% lymphs.  Cultures have no growth in less than 12 hours.  Cytology pending.  Subjective   Somewhat less fatigue but still requiring pressor support.  Inpatient Medications    . Chlorhexidine Gluconate Cloth  6 each Topical Q0600  . DULoxetine  120 mg Oral Daily  . epoetin  (EPOGEN/PROCRIT) injection  4,000 Units Intravenous Q M,W,F-HD  . ezetimibe  10 mg Oral Daily  . insulin aspart  0-9 Units Subcutaneous Q4H  . mouth rinse  15 mL Mouth Rinse BID  . midodrine  10 mg Oral Q M,W,F-HD  . mirtazapine  45 mg Oral QHS  . pantoprazole  40 mg Oral BID  . rOPINIRole  4 mg Oral BID AC  . rosuvastatin  10 mg Oral QHS  . sodium chloride flush  3 mL Intravenous Q12H  . sucralfate  1 g Oral TID    Vital Signs    Vitals:   03/08/21 0615 03/08/21 0630 03/08/21 0700 03/08/21 0800  BP: (!) 114/58 112/65 106/60 118/75  Pulse:   70   Resp: 17 (!) 23 20 (!) 26  Temp:      TempSrc:      SpO2:   (!) 85%   Weight:      Height:        Intake/Output Summary (Last 24 hours) at 03/08/2021 0844 Last data filed at 03/08/2021 0600 Gross per 24 hour  Intake 1974.49 ml  Output -  Net 1974.49 ml   Filed Weights   02/18/2021 1326 03/06/21 0621 02/13/2021 1030  Weight: 113.4 kg 117.7 kg 115.9 kg    Physical Exam    GEN: Well nourished, well developed, in no acute distress.  HEENT: normal.  Neck: Supple, no JVD, carotid bruits, or masses. Cardiac: RRR, no murmurs, rubs, or gallops. No clubbing, cyanosis, edema.  Radials/DP/PT 2+ and equal bilaterally.  Respiratory:  Respirations regular and unlabored, clear to auscultation bilaterally. GI: Soft, nontender, nondistended,  BS + x 4. MS: no deformity or atrophy. Skin: warm and dry, no rash. Neuro:  Strength and sensation are intact. Psych: Normal affect.  Labs    CBC Recent Labs    03/06/21 2101 03/02/2021 0420 03/08/21 0632  WBC 7.4 9.8 8.7  NEUTROABS 5.9 8.6*  --   HGB 9.4* 9.3* 9.4*  HCT 31.2* 31.2* 31.0*  MCV 97.5 98.1 96.9  PLT 279 342 570   Basic Metabolic Panel Recent Labs    02/18/2021 0420 03/08/21 0632  NA 136 136  K 4.0 3.6  CL 96* 96*  CO2 25 23  GLUCOSE 265* 158*  BUN 54* 69*  CREATININE 5.85* 7.29*  CALCIUM 7.0* 6.6*  MG 1.8 2.1  PHOS 7.3* 8.7*   Liver Function Tests Recent Labs     03/06/21 0548 03/06/2021 0420  AST 74* 104*  ALT 98* 128*  ALKPHOS 83 91  BILITOT 0.9 0.9  PROT 6.6 6.6  ALBUMIN 3.1* 3.3*   No results for input(s): LIPASE, AMYLASE in the last 72 hours. Cardiac Enzymes No results for input(s): CKTOTAL, CKMB, CKMBINDEX, TROPONINI in the last 72 hours. BNP Recent Labs    03/13/2021 1325 03/06/21 2324  BNP 530.6* 173.2*   D-Dimer No results for input(s): DDIMER in the last 72 hours. Hemoglobin A1C Recent Labs    03/06/21 0548  HGBA1C 5.7*   Fasting Lipid Panel No results for input(s): CHOL, HDL, LDLCALC, TRIG, CHOLHDL, LDLDIRECT in the last 72 hours. Thyroid Function Tests Recent Labs    03/06/21 2101  TSH 3.513    Telemetry    Currently in sinus rhythm  ECG    A. fib with RVR/sinus rhythm with right bundle branch block  Radiology    DG Chest 2 View  Result Date: 02/21/2021 CLINICAL DATA:  Chest pain EXAM: CHEST - 2 VIEW COMPARISON:  October 07, 2020 FINDINGS: Central catheter tip in superior vena cava near the cavoatrial junction. No pneumothorax. There is left base atelectasis. Lungs elsewhere are clear. Heart is slightly enlarged with pulmonary vascularity normal. No adenopathy. No bone lesions. IMPRESSION: Central catheter tip in superior vena cava near the cavoatrial junction. No pneumothorax. Left base atelectasis. No edema or airspace opacity. Mild cardiac prominence. Electronically Signed   By: Lowella Grip III M.D.   On: 02/21/2021 13:49   CT Angio Chest PE W and/or Wo Contrast  Result Date: 02/21/2021 CLINICAL DATA:  Right upper chest pain post dialysis catheter flushing last night. EXAM: CT ANGIOGRAPHY CHEST WITH CONTRAST TECHNIQUE: Multidetector CT imaging of the chest was performed using the standard protocol during bolus administration of intravenous contrast. Multiplanar CT image reconstructions and MIPs were obtained to evaluate the vascular anatomy. CONTRAST:  98mL OMNIPAQUE IOHEXOL 350 MG/ML SOLN COMPARISON:   Chest radiograph from earlier today. 01/28/2014 chest CT. FINDINGS: Cardiovascular: The study is high quality for the evaluation of pulmonary embolism. There are no filling defects in the central, lobar, segmental or subsegmental pulmonary artery branches to suggest acute pulmonary embolism. Normal course and caliber of the thoracic aorta. Dilated main pulmonary artery (3.8 cm diameter). Normal heart size. Small pericardial effusion. Three-vessel coronary atherosclerosis. Right internal jugular central venous catheter terminates in the lower third of the SVC. Mediastinum/Nodes: No discrete thyroid nodules. Unremarkable esophagus. No pathologically enlarged axillary, mediastinal or hilar lymph nodes. Lungs/Pleura: No pneumothorax. No pleural effusion. No acute consolidative airspace disease, lung masses or significant pulmonary nodules. Platelike parenchymal bands at the left greater than right lung bases compatible with scarring or  atelectasis. 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 Musculoskeletal: No aggressive appearing focal osseous lesions. Mild thoracic spondylosis. Review of the MIP images confirms the above findings. IMPRESSION: 1. No pulmonary embolism. 2. Dilated main pulmonary artery, suggesting pulmonary arterial hypertension. 3. Small pericardial effusion. 4. Three-vessel coronary atherosclerosis. 5. Platelike parenchymal bands at the left greater than right lung bases, compatible with scarring or atelectasis. Electronically Signed   By: Ilona Sorrel M.D.   On: 02/21/2021 15:45   DG Chest Port 1 View  Result Date: 03/04/2021 CLINICAL DATA:  Status post pericardiocentesis with chest discomfort EXAM: PORTABLE CHEST 1 VIEW COMPARISON:  March 07, 2021 study obtained earlier in the day. FINDINGS: Central catheter tip is in the superior vena cava near the cavoatrial junction, stable. No pneumothorax. There is a small left pleural effusion with left  lower lobe atelectatic change. There is also atelectatic change in the medial right base. There is generalized cardiac enlargement with pulmonary vascularity within normal limits. No adenopathy. No bone lesions IMPRESSION: Persistent enlargement of the cardiac silhouette. There may well be underlying pericardial effusion. There is a small left pleural effusion. There is bibasilar atelectasis, similar to earlier in the day. No pneumothorax. Central catheter tip near cavoatrial junction in the superior vena cava. Electronically Signed   By: Lowella Grip III M.D.   On: 03/05/2021 16:23   DG Chest Port 1 View  Result Date: 02/15/2021 CLINICAL DATA:  Acute respiratory failure. EXAM: PORTABLE CHEST 1 VIEW COMPARISON:  03/06/2021.  03/08/2021.  CT 02/21/2021. FINDINGS: Dual-lumen catheter noted with tip over cavoatrial junction in stable position. Stable cardiomegaly and pulmonary vascular congestion. Low lung volumes with persistent left base atelectasis. Persistent small left pleural effusion. Elevation left hemidiaphragm is most likely again present. No pneumothorax. IMPRESSION: 1. Dual-lumen catheter in stable position. 2. Stable cardiomegaly and pulmonary vascular congestion. 3. Low lung volumes with persistent left base atelectasis and small left pleural effusion. Electronically Signed   By: Marcello Moores  Register   On: 03/14/2021 05:24   DG Chest Port 1 View  Result Date: 03/06/2021 CLINICAL DATA:  Tachycardia EXAM: PORTABLE CHEST 1 VIEW COMPARISON:  02/17/2021 FINDINGS: Right dialysis catheter remains in place, unchanged. Cardiomegaly with vascular congestion. Left lower lobe opacity, likely atelectasis. Possible small left effusion. Possible small effusions. No overt edema or acute bony abnormality. IMPRESSION: Cardiomegaly, vascular congestion. Small bilateral effusions. Left base atelectasis. Electronically Signed   By: Rolm Baptise M.D.   On: 03/06/2021 21:02   DG Chest Port 1 View  Result Date:  02/12/2021 CLINICAL DATA:  Short of breath and edema.  Missed dialysis. EXAM: PORTABLE CHEST 1 VIEW COMPARISON:  02/21/2021 FINDINGS: Marked cardiac enlargement with progression. Possible pericardial effusion. Vascular congestion. Negative for edema. Interval development of small to moderate left pleural effusion and left lower lobe atelectasis. Dialysis catheter in the lower SVC unchanged. IMPRESSION: Interval development marked cardiac enlargement suggesting pericardial effusion Interval development of left pleural effusion and left lower lobe airspace disease. Electronically Signed   By: Franchot Gallo M.D.   On: 02/15/2021 14:42   ECHOCARDIOGRAM COMPLETE  Result Date: 03/06/2021    ECHOCARDIOGRAM REPORT   Patient Name:   NOEL RODIER Date of Exam: 03/06/2021 Medical Rec #:  [id number]             Height:       72.0 in Accession #:    [id number]            Weight:  259.5 lb Date of Birth:  [birth year]            BSA:          2.380 m Patient Age:    74 years              BP:           114/79 mmHg Patient Gender: M                     HR:           90 bpm. Exam Location:  [facility] Procedure: 2D Echo, Cardiac Doppler and Color Doppler Indications:     Pericardial effusion I31.3  History:         Patient has prior history of Echocardiogram examinations, most                  recent 11/08/2020. Risk Factors:Hypertension and Diabetes.  Sonographer:     Sherrie Sport RDCS (AE) Referring Phys:  [id number] Universal Diagnosing Phys: Bartholome Bill MD  Sonographer Comments: Global longitudinal strain was attempted. IMPRESSIONS  1. Left ventricular ejection fraction, by estimation, is 60 to 65%. The left ventricle has normal function. The left ventricle has no regional wall motion abnormalities. Left ventricular diastolic parameters were normal.  2. Right ventricular systolic function is normal. The right ventricular size is mildly enlarged.  3. Moderate pericardial effusion. The pericardial effusion is  circumferential. There is no evidence of cardiac tamponade.  4. The mitral valve is grossly normal. Trivial mitral valve regurgitation.  5. The aortic valve was not well visualized. Aortic valve regurgitation is trivial. FINDINGS  Left Ventricle: Left ventricular ejection fraction, by estimation, is 60 to 65%. The left ventricle has normal function. The left ventricle has no regional wall motion abnormalities. The left ventricular internal cavity size was normal in size. There is  borderline left ventricular hypertrophy. Left ventricular diastolic parameters were normal. Right Ventricle: The right ventricular size is mildly enlarged. No increase in right ventricular wall thickness. Right ventricular systolic function is normal. Left Atrium: Left atrial size was normal in size. Right Atrium: Right atrial size was normal in size. Pericardium: A moderately sized pericardial effusion is present. The pericardial effusion is circumferential. There is no evidence of cardiac tamponade. Mitral Valve: The mitral valve is grossly normal. Trivial mitral valve regurgitation. Tricuspid Valve: The tricuspid valve is grossly normal. Tricuspid valve regurgitation is mild. Aortic Valve: The aortic valve was not well visualized. Aortic valve regurgitation is trivial. Aortic valve mean gradient measures 2.0 mmHg. Aortic valve peak gradient measures 3.6 mmHg. Aortic valve area, by VTI measures 6.81 cm. Pulmonic Valve: The pulmonic valve was not well visualized. Pulmonic valve regurgitation is trivial. Aorta: The aortic root is normal in size and structure. IAS/Shunts: The interatrial septum was not assessed.  LEFT VENTRICLE PLAX 2D LVIDd:         3.77 cm  Diastology LVIDs:         2.50 cm  LV e' medial:    4.46 cm/s LV PW:         1.61 cm  LV E/e' medial:  15.0 LV IVS:        1.28 cm  LV e' lateral:   5.77 cm/s LVOT diam:     2.20 cm  LV E/e' lateral: 11.6 LV SV:         85 LV SV Index:   36 LVOT Area:     3.80 cm  RIGHT VENTRICLE RV  Basal diam:  3.60 cm RV S prime:     14.70 cm/s TAPSE (M-mode): 4.5 cm LEFT ATRIUM             Index       RIGHT ATRIUM           Index LA diam:        3.50 cm 1.47 cm/m  RA Area:     30.60 cm LA Vol (A2C):   90.1 ml 37.86 ml/m RA Volume:   106.00 ml 44.54 ml/m LA Vol (A4C):   78.9 ml 33.15 ml/m LA Biplane Vol: 87.7 ml 36.85 ml/m  AORTIC VALVE                   PULMONIC VALVE AV Area (Vmax):    4.54 cm    PV Vmax:        0.57 m/s AV Area (Vmean):   4.31 cm    PV Peak grad:   1.3 mmHg AV Area (VTI):     6.81 cm    RVOT Peak grad: 2 mmHg AV Vmax:           94.70 cm/s AV Vmean:          59.200 cm/s AV VTI:            0.125 m AV Peak Grad:      3.6 mmHg AV Mean Grad:      2.0 mmHg LVOT Vmax:         113.00 cm/s LVOT Vmean:        67.100 cm/s LVOT VTI:          0.224 m LVOT/AV VTI ratio: 1.79  AORTA Ao Root diam: 3.43 cm MITRAL VALVE               TRICUSPID VALVE MV Area (PHT): 2.99 cm    TR Peak grad:   12.8 mmHg MV Decel Time: 254 msec    TR Vmax:        179.00 cm/s MV E velocity: 66.80 cm/s MV A velocity: 58.70 cm/s  SHUNTS MV E/A ratio:  1.14        Systemic VTI:  0.22 m                            Systemic Diam: 2.20 cm Bartholome Bill MD Electronically signed by Bartholome Bill MD Signature Date/Time: 03/06/2021/10:03:59 AM    Final    ECHOCARDIOGRAM LIMITED  Result Date: 02/25/2021    ECHOCARDIOGRAM LIMITED REPORT   Patient Name:   COHL BEHRENS Bergum Date of Exam: 03/06/2021 Medical Rec #:  [id number]             Height:       72.0 in Accession #:    [id number]            Weight:       255.5 lb Date of Birth:  [birth year]            BSA:          2.364 m Patient Age:    34 years              BP:           Not listed on flowsheet/Not  listed on flowsheet mmHg Patient Gender: M                     HR:           Not listed in flowsheet bpm. Exam Location:  [facility] Procedure: Limited Echo, Cardiac Doppler and Color Doppler Indications:     Pericardial Effusion  I31.3  History:         Patient has prior history of Echocardiogram examinations, most                  recent 03/06/2021. COPD; Risk Factors:Hypertension.  Sonographer:     Sherrie Sport RDCS (AE) Referring Phys:  [id number] Teodoro Spray Diagnosing Phys: Yolonda Kida MD IMPRESSIONS  1. Improved pericardial effusion only mild from mod.  2. Left ventricular ejection fraction, by estimation, is 70 to 75%. Left ventricular ejection fraction by PLAX is 74 %. The left ventricle has hyperdynamic function. The left ventricle has no regional wall motion abnormalities. Left ventricular diastolic parameters were normal.  3. Right ventricular systolic function is normal. The right ventricular size is normal.  4. A small pericardial effusion is present.  5. The mitral valve is normal in structure. No evidence of mitral valve regurgitation.  6. The aortic valve is normal in structure. Aortic valve regurgitation is not visualized. FINDINGS  Left Ventricle: Left ventricular ejection fraction, by estimation, is 70 to 75%. Left ventricular ejection fraction by PLAX is 74 %. The left ventricle has hyperdynamic function. The left ventricle has no regional wall motion abnormalities. There is no left ventricular hypertrophy. Left ventricular diastolic parameters were normal. Right Ventricle: The right ventricular size is normal. No increase in right ventricular wall thickness. Right ventricular systolic function is normal. Left Atrium: Left atrial size was not assessed. Right Atrium: Right atrial size was not assessed. Pericardium: A small pericardial effusion is present. Mitral Valve: The mitral valve is normal in structure. Tricuspid Valve: The tricuspid valve is normal in structure. Tricuspid valve regurgitation is not demonstrated. Aortic Valve: The aortic valve is normal in structure. Aortic valve regurgitation is not visualized. Pulmonic Valve: The pulmonic valve was grossly normal. Aorta: The ascending aorta was not well  visualized. Additional Comments: Improved pericardial effusion only mild from mod. LEFT VENTRICLE PLAX 2D LV EF:         Left ventricular ejection fraction by PLAX is 74 %. LVIDd:         4.80 cm LVIDs:         2.74 cm LV PW:         1.68 cm LV IVS:        1.63 cm  LEFT ATRIUM         Index LA diam:    4.00 cm 1.69 cm/m                        PULMONIC VALVE AORTA                 PV Vmax:        0.60 m/s Ao Root diam: 3.50 cm PV Peak grad:   1.4 mmHg                       RVOT Peak grad: 2 mmHg  TRICUSPID VALVE TR Peak grad:   5.2 mmHg TR Vmax:        114.00 cm/s Yolonda Kida MD Electronically signed by Karma Greaser  Prince Rome MD Signature Date/Time: 03/13/2021/11:36:20 PM    Final     Assessment & Plan     1.  Pericardial effusion-status post pericardiocentesis with removal of 300 to 500 cc of bloody fluid likely exudative.  Cytology and labs are still pending.  Some clinical improvement with procedure although patient still somewhat hypotensive.  Will follow for recurrence.  Should recurrence occur, will likely need referral to thoracic surgery for a pericardial window.  We will repeat limited echo is to follow effusion.  2.  Atrial fib-has been cardioverted to sinus rhythm.  We will continue with IV amiodarone today and convert to p.o. over the weekend.  3.  End-stage renal disease-on hemodialysis.  Being followed by nephrology.  4.  UTI-on empiric antibiotics.  5.  Hypotension-likely multifactorial.  Slight improvement with pericardiocentesis.  Did not appear to be hemodynamically compromising them.  Doing better in sinus rhythm and will attempt to maintain this.  Patient will be followed by Dr. Clayborn Bigness over the weekend for me.  Signed, Javier Docker Fath MD 03/08/2021, 8:44 AM  Pager: [phone]

## 2021-03-08 NOTE — Consult Note (Signed)
MEDICATION RELATED CONSULT NOTE - INITIAL   Pharmacy Consult for drug-drug interactions with amiodarone Indication: afib  Medications:  Scheduled:  . calcium carbonate  1,000 mg of elemental calcium Oral Once  . Chlorhexidine Gluconate Cloth  6 each Topical Q0600  . DULoxetine  120 mg Oral Daily  . epoetin (EPOGEN/PROCRIT) injection  4,000 Units Intravenous Q M,W,F-HD  . ezetimibe  10 mg Oral Daily  . insulin aspart  0-9 Units Subcutaneous Q4H  . mouth rinse  15 mL Mouth Rinse BID  . midodrine  10 mg Oral Q M,W,F-HD  . mirtazapine  45 mg Oral QHS  . pantoprazole  40 mg Oral BID  . potassium chloride  40 mEq Oral Once  . rOPINIRole  4 mg Oral BID AC  . rosuvastatin  10 mg Oral QHS  . sodium chloride flush  3 mL Intravenous Q12H  . sucralfate  1 g Oral TID   Assessment: 75 yo M presented with respiratory distress with hypotension and afib with RVR, HR 160. Pt receives HD MWF. PMH includes COPD, depression, diabetes, HLD, and HTN. Pt with symptomatic afib on 3/24 - cardioverted and started on heparin gtt. S/p cath 3/24. Planning to convert to PO amiodarone over weekend. Pharmacy consulted to monitor for drug-drug interactions while on amiodarone.   Amiodarone and ropinirole - monitor for hypotension  Amiodarone and duloxetine - monitor for hypotension  Amiodarone and mirtazapine - monitor for QTc prolongation  Amiodarone and rosuvastatin - monitor for hepatotoxicity  BP overall soft with SBPs 95-118 and DBPs 54-94 QTc: EKG 3/22 @ 1538: 492 AST: 142>74>104 ALT: 110>98>128  Continue to monitor blood pressure, QTc, and AST/ALT daily while on amiodarone.  Benn Moulder, PharmD Pharmacy Resident  03/08/2021 7:31 AM

## 2021-03-08 NOTE — Progress Notes (Signed)
PHARMACY CONSULT NOTE - FOLLOW UP  Pharmacy Consult for Electrolyte Monitoring and Replacement   Recent Labs: Potassium (mmol/L)  Date Value  03/08/2021 3.6  01/10/2015 4.3   Magnesium (mg/dL)  Date Value  03/08/2021 2.1  06/22/2014 1.7 (L)   Calcium (mg/dL)  Date Value  03/08/2021 6.6 (L)   Calcium, Total (mg/dL)  Date Value  01/10/2015 9.2   Albumin (g/dL)  Date Value  02/25/2021 3.3 (L)  06/22/2014 3.2 (L)   Phosphorus (mg/dL)  Date Value  03/08/2021 8.7 (H)  06/22/2014 2.7   Sodium (mmol/L)  Date Value  03/08/2021 136  01/10/2015 142   Corrected Ca: 7.16  Assessment: Carols Clemence is a 75 y.o. male admitted on 03/14/2021 with sepsis thought to be secondary to UTI. Pt is on HD MWF. Pharmacy has been consulted to assist in electrolyte monitoring and replacement as indicated.  Goal of Therapy:  K: 4 - 5.1  Mg: 2 - 2.4 All other electrolytes within normal limits   Plan:  - K 3.6 - ordered KCl 40 mEq PO x 1 dose - Ca 7.16 - ordered 1000 mg elemental Ca replacement with calcium carbonate - Phos 8.7 - Renvela 1600 mg PO TID added by nephro. Patient scheduled for dialysis 3/25 - expect hyperphosphatemia to improve with dialysis.  - Monitor electrolytes with AM lab  Benn Moulder, PharmD Pharmacy Resident  03/08/2021 7:24 AM

## 2021-03-08 NOTE — Progress Notes (Signed)
0400 assessment unchanged from previous.

## 2021-03-08 NOTE — Progress Notes (Signed)
[place], [place] 03/08/21  Subjective:   LOS: 3  [name] is a 75 year old male currently followed by our office, Dr. Holley Raring.  He has a past medical history of ESRD on HD MWF, history of DVT on Eliquis, essential hypertension, chronic anxiety and depression, type 2 diabetes, and hyperlipidemia.  He presents to the emergency room with progressive shortness of breath and weakness for 5 days.  He was found to have a flutter on arrival to the ED with a heart rate of 160 and low BP.    -Patient in sinus rhythm this AM.  Previously was in atrial fibrillation and underwent cardioversion.  Currently on pressors.  Due for dialysis today as well.  Objective:  Vital signs in last 24 hours:  Temp:  [98 F (36.7 C)-99.5 F (37.5 C)] 98 F (36.7 C) (03/25 0400) Pulse Rate:  [58-81] 70 (03/25 0700) Resp:  [13-26] 26 (03/25 0800) BP: (84-118)/(54-82) 118/75 (03/25 0800) SpO2:  [85 %-98 %] 85 % (03/25 0700) Weight:  [115.9 kg] 115.9 kg (03/24 1030)  Weight change:  Filed Weights   02/16/2021 1326 03/06/21 0621 02/21/2021 1030  Weight: 113.4 kg 117.7 kg 115.9 kg    Intake/Output:    Intake/Output Summary (Last 24 hours) at 03/08/2021 0945 Last data filed at 03/08/2021 0600 Gross per 24 hour  Intake 1974.49 ml  Output -  Net 1974.49 ml     Physical Exam: General: No acute distress  HEENT Normocephalic. Atraumatic, moist oral mucosa  Pulm/lungs Minimal basilar rales, normal effort  CVS/Heart Regular this AM  Abdomen:  Soft, non tender  Extremities: trace peripheral edema  Neurologic: Awake, alert, conversant  Skin: No acute skin rash  Access: Rt IJ Permcth       Basic Metabolic Panel:  Recent Labs  Lab 03/09/2021 1325 03/06/21 0548 02/22/2021 0420 03/08/21 0632  NA 137 138 136 136  K 3.7 4.0 4.0 3.6  CL 96* 97* 96* 96*  CO2 25 25 25 23   GLUCOSE 204* 111* 265* 158*  BUN 64* 72* 54* 69*  CREATININE 6.23* 7.12* 5.85* 7.29*  CALCIUM 7.0* 6.7*  7.0* 6.6*  MG  --  1.8 1.8 2.1  PHOS  --  9.0* 7.3* 8.7*     CBC: Recent Labs  Lab 02/26/2021 1325 03/06/21 0548 03/06/21 2101 02/19/2021 0420 03/08/21 0632  WBC 8.1 9.2 7.4 9.8 8.7  NEUTROABS 6.7 7.4 5.9 8.6*  --   HGB 9.3* 9.3* 9.4* 9.3* 9.4*  HCT 30.2* 30.8* 31.2* 31.2* 31.0*  MCV 95.3 97.5 97.5 98.1 96.9  PLT 300 295 279 342 286      Lab Results  Component Value Date   HEPBSAG NON REACTIVE 10/02/2020   HEPBSAG NON REACTIVE 10/02/2020   HEPBSAB NON REACTIVE 10/02/2020   HEPBIGM NON REACTIVE 10/02/2020      Microbiology:  Recent Results (from the past 240 hour(s))  Urine Culture     Status: None   Collection Time: 03/04/2021  2:51 PM   Specimen: Urine, Random  Result Value Ref Range Status   Specimen Description   Final    URINE, RANDOM Performed at [facility], [address], [place], [place] [address]    Special Requests   Final    NONE Performed at [facility], [address], [place], [place] [address]    Culture   Final    NO GROWTH Performed at [facility], [place] [address], [place],  [address]  Report Status 02/28/2021 FINAL  Final  Resp Panel by RT-PCR (Flu A&B, Covid) Nasopharyngeal Swab     Status: None   Collection Time: 02/17/2021  3:45 PM   Specimen: Nasopharyngeal Swab; Nasopharyngeal(NP) swabs in vial transport medium  Result Value Ref Range Status   SARS Coronavirus 2 by RT PCR NEGATIVE NEGATIVE Final    Comment: (NOTE) SARS-CoV-2 target nucleic acids are NOT DETECTED.  The SARS-CoV-2 RNA is generally detectable in upper respiratory specimens during the acute phase of infection. The lowest concentration of SARS-CoV-2 viral copies this assay can detect is 138 copies/mL. A negative result does not preclude SARS-Cov-2 infection and should not be used as the sole basis for treatment or other patient management decisions. A negative result may occur with  improper specimen collection/handling, submission of  specimen other than nasopharyngeal swab, presence of viral mutation(s) within the areas targeted by this assay, and inadequate number of viral copies(<138 copies/mL). A negative result must be combined with clinical observations, patient history, and epidemiological information. The expected result is Negative.  Fact Sheet for Patients:  EntrepreneurPulse.com.au  Fact Sheet for Healthcare Providers:  IncredibleEmployment.be  This test is no t yet approved or cleared by the [place] FDA and  has been authorized for detection and/or diagnosis of SARS-CoV-2 by FDA under an Emergency Use Authorization (EUA). This EUA will remain  in effect (meaning this test can be used) for the duration of the COVID-19 declaration under Section 564(b)(1) of the Act, 21 U.S.C.section 360bbb-3(b)(1), unless the authorization is terminated  or revoked sooner.       Influenza A by PCR NEGATIVE NEGATIVE Final   Influenza B by PCR NEGATIVE NEGATIVE Final    Comment: (NOTE) The Xpert Xpress SARS-CoV-2/FLU/RSV plus assay is intended as an aid in the diagnosis of influenza from Nasopharyngeal swab specimens and should not be used as a sole basis for treatment. Nasal washings and aspirates are unacceptable for Xpert Xpress SARS-CoV-2/FLU/RSV testing.  Fact Sheet for Patients: EntrepreneurPulse.com.au  Fact Sheet for Healthcare Providers: IncredibleEmployment.be  This test is not yet approved or cleared by the [place] FDA and has been authorized for detection and/or diagnosis of SARS-CoV-2 by FDA under an Emergency Use Authorization (EUA). This EUA will remain in effect (meaning this test can be used) for the duration of the COVID-19 declaration under Section 564(b)(1) of the Act, 21 U.S.C. section 360bbb-3(b)(1), unless the authorization is terminated or revoked.  Performed at [facility], [address] [place], [place] [address]   CULTURE, BLOOD (ROUTINE X 2) w Reflex to ID Panel     Status: None (Preliminary result)   Collection Time: 02/18/2021  6:17 PM   Specimen: BLOOD  Result Value Ref Range Status   Specimen Description BLOOD BLOOD RIGHT HAND  Final   Special Requests   Final    BOTTLES DRAWN AEROBIC AND ANAEROBIC Blood Culture adequate volume   Culture   Final    NO GROWTH 3 DAYS Performed at [facility], [address], [place], [place] [address]    Report Status PENDING  Incomplete  CULTURE, BLOOD (ROUTINE X 2) w Reflex to ID Panel     Status: None (Preliminary result)   Collection Time: 02/15/2021  6:18 PM   Specimen: BLOOD  Result Value Ref Range Status   Specimen Description BLOOD RIGHT ANTECUBITAL  Final   Special Requests   Final    BOTTLES DRAWN AEROBIC AND ANAEROBIC Blood Culture adequate volume   Culture  Final    NO GROWTH 3 DAYS Performed at [facility], [address] [place], [place] [address]    Report Status PENDING  Incomplete  MRSA PCR Screening     Status: None   Collection Time: 03/06/21  9:20 AM   Specimen: Nasopharyngeal  Result Value Ref Range Status   MRSA by PCR NEGATIVE NEGATIVE Final    Comment:        The GeneXpert MRSA Assay (FDA approved for NASAL specimens only), is one component of a comprehensive MRSA colonization surveillance program. It is not intended to diagnose MRSA infection nor to guide or monitor treatment for MRSA infections. Performed at [facility], [address] [place], [place] [address]   Body fluid culture w Gram Stain     Status: None (Preliminary result)   Collection Time: 02/26/2021  2:03 PM   Specimen: Pericardial; Body Fluid  Result Value Ref Range Status   Specimen Description   Final    PERICARDIAL Performed at [facility], [address], [place], [place] [address]    Special Requests   Final    PERICARDIAL Performed at [facility], [place], [place] [address]    Gram Stain   Final    ABUNDANT WBC PRESENT,BOTH PMN AND MONONUCLEAR NO ORGANISMS SEEN    Culture   Final    NO GROWTH < 12 HOURS Performed at [facility], [place] [address] [place],  [address]    Report Status PENDING  Incomplete    Coagulation Studies: Recent Labs    03/10/2021 1051  LABPROT 20.0*  INR 1.8*    Urinalysis: Recent Labs    02/15/2021 1451  COLORURINE AMBER*  LABSPEC 1.018  PHURINE 5.0  GLUCOSEU NEGATIVE  HGBUR NEGATIVE  BILIRUBINUR NEGATIVE  KETONESUR NEGATIVE  PROTEINUR 100*  NITRITE NEGATIVE  LEUKOCYTESUR MODERATE*      Imaging: CARDIAC CATHETERIZATION  Result Date: 03/08/2021  Successful pericardiocentesis  Removal of 300 cc of bloody fluid  Drain was not left in place  Slight improvement in systolic blood pressure at the end of procedure  Conclusion Successful pericardiocentesis under ultrasound guidance subxiphoid approach 300 cc of bloody fluid was removed without difficulty No complications Patient tolerated procedure well   DG Chest Port 1 View  Result Date: 03/01/2021 CLINICAL DATA:  Status post pericardiocentesis with chest discomfort EXAM: PORTABLE CHEST 1 VIEW COMPARISON:  March 07, 2021 study obtained earlier in the day. FINDINGS: Central catheter tip is in the superior vena cava near the cavoatrial junction, stable. No pneumothorax. There is a small left pleural effusion with left lower lobe atelectatic change. There is also atelectatic change in the medial right base. There is generalized cardiac enlargement with pulmonary vascularity within normal limits. No adenopathy. No bone lesions IMPRESSION: Persistent enlargement of the cardiac silhouette. There may well be underlying pericardial effusion. There is a small left pleural effusion. There is bibasilar atelectasis, similar to earlier in the day. No pneumothorax. Central catheter tip near cavoatrial junction in the superior vena cava.  Electronically Signed   By: Lowella Grip III M.D.   On: 02/28/2021 16:23   DG Chest Port 1 View  Result Date: 03/08/2021 CLINICAL DATA:  Acute respiratory failure. EXAM: PORTABLE CHEST 1 VIEW COMPARISON:  03/06/2021.  02/27/2021.  CT 02/21/2021. FINDINGS: Dual-lumen catheter noted with tip over cavoatrial junction in stable position. Stable cardiomegaly and pulmonary vascular congestion. Low lung volumes with persistent left base atelectasis. Persistent small left  pleural effusion. Elevation left hemidiaphragm is most likely again present. No pneumothorax. IMPRESSION: 1. Dual-lumen catheter in stable position. 2. Stable cardiomegaly and pulmonary vascular congestion. 3. Low lung volumes with persistent left base atelectasis and small left pleural effusion. Electronically Signed   By: Marcello Moores  Register   On: 02/21/2021 05:24   DG Chest Port 1 View  Result Date: 03/06/2021 CLINICAL DATA:  Tachycardia EXAM: PORTABLE CHEST 1 VIEW COMPARISON:  02/18/2021 FINDINGS: Right dialysis catheter remains in place, unchanged. Cardiomegaly with vascular congestion. Left lower lobe opacity, likely atelectasis. Possible small left effusion. Possible small effusions. No overt edema or acute bony abnormality. IMPRESSION: Cardiomegaly, vascular congestion. Small bilateral effusions. Left base atelectasis. Electronically Signed   By: Rolm Baptise M.D.   On: 03/06/2021 21:02   ECHOCARDIOGRAM LIMITED  Result Date: 02/12/2021    ECHOCARDIOGRAM LIMITED REPORT   Patient Name:   [name] Date of Exam: 02/25/2021 Medical Rec #:  [id number]             Height:       72.0 in Accession #:    [id number]            Weight:       255.5 lb Date of Birth:  06-19-1946            BSA:          2.364 m Patient Age:    76 years              BP:           Not listed on flowsheet/Not                                                     listed on flowsheet mmHg Patient Gender: M                     HR:           Not listed in  flowsheet bpm. Exam Location:  [facility] Procedure: Limited Echo, Cardiac Doppler and Color Doppler Indications:     Pericardial Effusion I31.3  History:         Patient has prior history of Echocardiogram examinations, most                  recent 03/06/2021. COPD; Risk Factors:Hypertension.  Sonographer:     Sherrie Sport RDCS (AE) Referring Phys:  [id number] Teodoro Spray Diagnosing Phys: Yolonda Kida MD IMPRESSIONS  1. Improved pericardial effusion only mild from mod.  2. Left ventricular ejection fraction, by estimation, is 70 to 75%. Left ventricular ejection fraction by PLAX is 74 %. The left ventricle has hyperdynamic function. The left ventricle has no regional wall motion abnormalities. Left ventricular diastolic parameters were normal.  3. Right ventricular systolic function is normal. The right ventricular size is normal.  4. A small pericardial effusion is present.  5. The mitral valve is normal in structure. No evidence of mitral valve regurgitation.  6. The aortic valve is normal in structure. Aortic valve regurgitation is not visualized. FINDINGS  Left Ventricle: Left ventricular ejection fraction, by estimation, is 70 to 75%. Left ventricular ejection fraction by PLAX is 74 %. The left ventricle has hyperdynamic function. The left ventricle has no regional wall motion abnormalities. There is no left ventricular  hypertrophy. Left ventricular diastolic parameters were normal. Right Ventricle: The right ventricular size is normal. No increase in right ventricular wall thickness. Right ventricular systolic function is normal. Left Atrium: Left atrial size was not assessed. Right Atrium: Right atrial size was not assessed. Pericardium: A small pericardial effusion is present. Mitral Valve: The mitral valve is normal in structure. Tricuspid Valve: The tricuspid valve is normal in structure. Tricuspid valve regurgitation is not demonstrated. Aortic Valve: The aortic valve is normal in structure. Aortic valve  regurgitation is not visualized. Pulmonic Valve: The pulmonic valve was grossly normal. Aorta: The ascending aorta was not well visualized. Additional Comments: Improved pericardial effusion only mild from mod. LEFT VENTRICLE PLAX 2D LV EF:         Left ventricular ejection fraction by PLAX is 74 %. LVIDd:         4.80 cm LVIDs:         2.74 cm LV PW:         1.68 cm LV IVS:        1.63 cm  LEFT ATRIUM         Index LA diam:    4.00 cm 1.69 cm/m                        PULMONIC VALVE AORTA                 PV Vmax:        0.60 m/s Ao Root diam: 3.50 cm PV Peak grad:   1.4 mmHg                       RVOT Peak grad: 2 mmHg  TRICUSPID VALVE TR Peak grad:   5.2 mmHg TR Vmax:        114.00 cm/s Yolonda Kida MD Electronically signed by Yolonda Kida MD Signature Date/Time: 03/06/2021/11:36:20 PM    Final      Medications:   . sodium chloride Stopped (03/10/2021 0909)  . amiodarone 30 mg/hr (03/08/21 0600)  . heparin 1,600 Units/hr (03/08/21 0401)  . norepinephrine (LEVOPHED) Adult infusion 2 mcg/min (02/27/2021 0443)  . phenylephrine (NEO-SYNEPHRINE) Adult infusion 60 mcg/min (03/08/21 0600)  . piperacillin-tazobactam (ZOSYN)  IV 100 mL/hr at 03/08/21 0600  . vasopressin 0.04 Units/min (03/08/21 0600)   . Chlorhexidine Gluconate Cloth  6 each Topical Q0600  . DULoxetine  120 mg Oral Daily  . epoetin (EPOGEN/PROCRIT) injection  4,000 Units Intravenous Q M,W,F-HD  . ezetimibe  10 mg Oral Daily  . insulin aspart  0-9 Units Subcutaneous Q4H  . mouth rinse  15 mL Mouth Rinse BID  . midodrine  10 mg Oral Q M,W,F-HD  . mirtazapine  45 mg Oral QHS  . pantoprazole  40 mg Oral BID  . rOPINIRole  4 mg Oral BID AC  . rosuvastatin  10 mg Oral QHS  . sodium chloride flush  3 mL Intravenous Q12H  . sucralfate  1 g Oral TID   acetaminophen, clonazepam, guaiFENesin, hydrocortisone cream, lidocaine HCl (PF)  Assessment/ Plan:  75 y.o. male with past medical history of ESRD on HD MWF, history of DVT on  Eliquis, essential hypertension, chronic anxiety and depression, type 2 diabetes, and hyperlipidemia was admitted on 03/03/2021   Active Problems:   UTI (urinary tract infection)  UTI (urinary tract infection) [N39.0] Acute cystitis without hematuria [N30.00] Generalized weakness [R53.1] Atypical atrial flutter ([place]) [I48.4]  [facility]/MWF/  Rt IJ Permcath  #. ESRD on HD Patient due for hemodialysis treatment today.  We will need to monitor pressure closely during dialysis.  As she is a bit hypotensive and on pressors.  #. Anemia of CKD  Lab Results  Component Value Date   HGB 9.4 (L) 03/08/2021   Continue Epogen 4000 units IV with dialysis..  #. Secondary hyperparathyroidism of renal origin N 25.81   No results found for: PTH Lab Results  Component Value Date   PHOS 8.7 (H) 03/08/2021   Start Renvela 2 tablets p.o. 3 times daily with meals.  #. Diabetes type 2 with CKD Hemoglobin A1C (%)  Date Value  01/27/2014 5.5   Hgb A1c MFr Bld (%)  Date Value  03/06/2021 5.7 (H)  Stable with diet . # Hypotension/pericardial effusion.  Continue midodrine.  Also had a pericardial effusion and is status post pericardiocentesis.  Still on pressors.  Monitor pressures during dialysis treatment.   LOS: 3 Munsoor Lateef 3/25/20229:45 AM  [facility] [place], [place]

## 2021-03-08 NOTE — Progress Notes (Signed)
Pt newly tachycardic sustaining > 125 since 1800 per care RN. Bedside assessment patient complaining of dyspnea, stating he has been feeling SOB all day today. When asked if this is different from yesterday, the patient is unsure. Patient did receive HD this evening, pulled off 1 L, and is currently on neo-synephrine which RN is actively weaning. Patient currently on Amiodarone & heparin drips for A-fib RVR s/p cardioversion.  Due to concerns of re-accumulation of pericardial effusion: - STAT 12 lead EKG - STAT CXR - cardiology paged by care RN: Dr. Clayborn Bigness responded agreement with plan for 2.5 mg of lopressor     Domingo Pulse [place], AGACNP-BC Acute Care Nurse Practitioner [facility] [place]   [phone] / [phone] Please see Amion for pager details.

## 2021-03-08 NOTE — Consult Note (Signed)
ANTICOAGULATION CONSULT NOTE - Follow Up Consult  Pharmacy Consult for heparin Indication: atrial fibrillation  Allergies  Allergen Reactions  . Bupropion     Other reaction(s): Other (See Comments) Mood swings, angry Other reaction(s): Other (See Comments) Mood swings, angry    Patient Measurements: Height: 6' (182.9 cm) Weight: 115.9 kg (255 lb 8.2 oz) IBW/kg (Calculated) : 77.6 Heparin Dosing Weight: 101.9  Vital Signs: Temp: 99.2 F (37.3 C) (03/24 1956) Temp Source: Oral (03/24 1956) BP: 95/58 (03/25 0230) Pulse Rate: 72 (03/25 0230)  Labs: Recent Labs    02/13/2021 1325 02/13/2021 1545 03/06/21 0548 03/06/21 2101 02/14/2021 0420 03/02/2021 1051 02/20/2021 2357  HGB 9.3*  --  9.3* 9.4* 9.3*  --   --   HCT 30.2*  --  30.8* 31.2* 31.2*  --   --   PLT 300  --  295 279 342  --   --   APTT  --   --   --   --   --   --  61*  LABPROT  --   --   --   --   --  20.0*  --   INR  --   --   --   --   --  1.8*  --   CREATININE 6.23*  --  7.12*  --  5.85*  --   --   TROPONINIHS 24* 27*  --   --   --   --   --     Estimated Creatinine Clearance: 15 mL/min (A) (by C-G formula based on SCr of 5.85 mg/dL (H)).   Medications:  Apixaban 2.5 mg BID prior to admission  Assessment: 75 yo M presented with respiratory distress with hypotension and afib with RVR, HR 160. Pt receives HD MWF. PMH includes COPD, depression, diabetes, HLD, and HTN. Pt with symptomatic afib on 3/24 - cardioverted and started on heparin gtt. Heparin gtt was stopped 3/24 while pt was in cath lab. Pharmacy consulted to restart and monitor heparin.   Goal of Therapy:  Heparin level 0.3-0.7 units/ml Monitor platelets by anticoagulation protocol: Yes  .aPTT goal 66-102  03/24 2357 aPTT 61, subtherapeutic   Plan:  Bolus 1500 units x 1 Increase heparin infusion to 1600 units/hr Check aPTT level in 8 hours and daily while on heparin Will monitor via aPTT levels until level correlates with heparin level since pt  was on Eliquis prior to admission Continue to monitor H&H and platelets  Renda Rolls, PharmD, [facility] 73/25/2022 2:48 AM

## 2021-03-09 DIAGNOSIS — J9601 Acute respiratory failure with hypoxia: Secondary | ICD-10-CM | POA: Diagnosis not present

## 2021-03-09 DIAGNOSIS — I4891 Unspecified atrial fibrillation: Secondary | ICD-10-CM | POA: Diagnosis not present

## 2021-03-09 DIAGNOSIS — N186 End stage renal disease: Secondary | ICD-10-CM | POA: Diagnosis not present

## 2021-03-09 DIAGNOSIS — I313 Pericardial effusion (noninflammatory): Secondary | ICD-10-CM | POA: Diagnosis not present

## 2021-03-09 LAB — HEPATITIS B DNA, ULTRAQUANTITATIVE, PCR
HBV DNA SERPL PCR-ACNC: NOT DETECTED IU/mL
HBV DNA SERPL PCR-LOG IU: UNDETERMINED log10 IU/mL

## 2021-03-09 LAB — BASIC METABOLIC PANEL
Anion gap: 12 (ref 5–15)
BUN: 47 mg/dL — ABNORMAL HIGH (ref 8–23)
CO2: 26 mmol/L (ref 22–32)
Calcium: 7.1 mg/dL — ABNORMAL LOW (ref 8.9–10.3)
Chloride: 99 mmol/L (ref 98–111)
Creatinine, Ser: 6 mg/dL — ABNORMAL HIGH (ref 0.61–1.24)
GFR, Estimated: 9 mL/min — ABNORMAL LOW (ref >60)
Glucose, Bld: 138 mg/dL — ABNORMAL HIGH (ref 70–99)
Potassium: 3.6 mmol/L (ref 3.5–5.1)
Sodium: 137 mmol/L (ref 135–145)

## 2021-03-09 LAB — CBC
HCT: 29.9 % — ABNORMAL LOW (ref 39.0–52.0)
Hemoglobin: 9 g/dL — ABNORMAL LOW (ref 13.0–17.0)
MCH: 29.2 pg (ref 26.0–34.0)
MCHC: 30.1 g/dL (ref 30.0–36.0)
MCV: 97.1 fL (ref 80.0–100.0)
Platelets: 261 10*3/uL (ref 150–400)
RBC: 3.08 MIL/uL — ABNORMAL LOW (ref 4.22–5.81)
RDW: 15.5 % (ref 11.5–15.5)
WBC: 7.7 10*3/uL (ref 4.0–10.5)
nRBC: 0 % (ref 0.0–0.2)

## 2021-03-09 LAB — COMPREHENSIVE METABOLIC PANEL
ALT: 103 U/L — ABNORMAL HIGH (ref 0–44)
AST: 49 U/L — ABNORMAL HIGH (ref 15–41)
Albumin: 2.8 g/dL — ABNORMAL LOW (ref 3.5–5.0)
Alkaline Phosphatase: 77 U/L (ref 38–126)
Anion gap: 12 (ref 5–15)
BUN: 49 mg/dL — ABNORMAL HIGH (ref 8–23)
CO2: 26 mmol/L (ref 22–32)
Calcium: 7.3 mg/dL — ABNORMAL LOW (ref 8.9–10.3)
Chloride: 98 mmol/L (ref 98–111)
Creatinine, Ser: 6.22 mg/dL — ABNORMAL HIGH (ref 0.61–1.24)
GFR, Estimated: 9 mL/min — ABNORMAL LOW (ref >60)
Glucose, Bld: 154 mg/dL — ABNORMAL HIGH (ref 70–99)
Potassium: 3.5 mmol/L (ref 3.5–5.1)
Sodium: 136 mmol/L (ref 135–145)
Total Bilirubin: 0.7 mg/dL (ref 0.3–1.2)
Total Protein: 6.3 g/dL — ABNORMAL LOW (ref 6.5–8.1)

## 2021-03-09 LAB — MAGNESIUM: Magnesium: 2 mg/dL (ref 1.7–2.4)

## 2021-03-09 LAB — GLUCOSE, CAPILLARY
Glucose-Capillary: 124 mg/dL — ABNORMAL HIGH (ref 70–99)
Glucose-Capillary: 127 mg/dL — ABNORMAL HIGH (ref 70–99)
Glucose-Capillary: 138 mg/dL — ABNORMAL HIGH (ref 70–99)
Glucose-Capillary: 143 mg/dL — ABNORMAL HIGH (ref 70–99)
Glucose-Capillary: 157 mg/dL — ABNORMAL HIGH (ref 70–99)
Glucose-Capillary: 159 mg/dL — ABNORMAL HIGH (ref 70–99)
Glucose-Capillary: 167 mg/dL — ABNORMAL HIGH (ref 70–99)

## 2021-03-09 LAB — HEPARIN LEVEL (UNFRACTIONATED): Heparin Unfractionated: 0.74 IU/mL — ABNORMAL HIGH (ref 0.30–0.70)

## 2021-03-09 LAB — APTT
aPTT: 65 seconds — ABNORMAL HIGH (ref 24–36)
aPTT: 92 seconds — ABNORMAL HIGH (ref 24–36)

## 2021-03-09 LAB — PHOSPHORUS: Phosphorus: 5.5 mg/dL — ABNORMAL HIGH (ref 2.5–4.6)

## 2021-03-09 LAB — HEPATITIS B SURFACE ANTIBODY, QUANTITATIVE: Hep B S AB Quant (Post): <3.1 m[IU]/mL — ABNORMAL LOW (ref >9.9)

## 2021-03-09 MED ORDER — BENZONATATE 100 MG PO CAPS
100.0000 mg | ORAL_CAPSULE | Freq: Two times a day (BID) | ORAL | Status: DC | PRN
  Administered 2021-03-09 – 2021-03-10 (×2): 100 mg via ORAL
  Filled 2021-03-09 (×2): qty 1

## 2021-03-09 MED ORDER — HEPARIN (PORCINE) 25000 UT/250ML-% IV SOLN
2000.0000 [IU]/h | INTRAVENOUS | Status: DC
  Administered 2021-03-09 – 2021-03-12 (×5): 2000 [IU]/h via INTRAVENOUS
  Filled 2021-03-09 (×5): qty 250

## 2021-03-09 MED ORDER — METOPROLOL TARTRATE 5 MG/5ML IV SOLN
5.0000 mg | Freq: Four times a day (QID) | INTRAVENOUS | Status: DC | PRN
  Administered 2021-03-09: 5 mg via INTRAVENOUS
  Filled 2021-03-09: qty 5

## 2021-03-09 MED ORDER — POTASSIUM CHLORIDE CRYS ER 20 MEQ PO TBCR
10.0000 meq | EXTENDED_RELEASE_TABLET | Freq: Once | ORAL | Status: AC
  Administered 2021-03-09: 10 meq via ORAL
  Filled 2021-03-09: qty 1

## 2021-03-09 MED ORDER — ALPRAZOLAM 0.5 MG PO TABS
0.5000 mg | ORAL_TABLET | Freq: Every evening | ORAL | Status: DC | PRN
  Administered 2021-03-09: 0.5 mg via ORAL
  Filled 2021-03-09: qty 1

## 2021-03-09 MED ORDER — METOPROLOL TARTRATE 5 MG/5ML IV SOLN
2.5000 mg | Freq: Once | INTRAVENOUS | Status: AC
  Administered 2021-03-09: 2.5 mg via INTRAVENOUS
  Filled 2021-03-09: qty 5

## 2021-03-09 MED ORDER — DILTIAZEM HCL-DEXTROSE 125-5 MG/125ML-% IV SOLN (PREMIX): 5.0000 mg/h | INTRAVENOUS | Status: DC

## 2021-03-09 MED ORDER — METOPROLOL TARTRATE 5 MG/5ML IV SOLN
5.0000 mg | Freq: Once | INTRAVENOUS | Status: AC
  Administered 2021-03-09: 5 mg via INTRAVENOUS
  Filled 2021-03-09: qty 5
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
## 2021-03-09 NOTE — Progress Notes (Signed)
PHARMACY CONSULT NOTE - FOLLOW UP  Pharmacy Consult for Electrolyte Monitoring and Replacement   Recent Labs: Potassium (mmol/L)  Date Value  03/09/2021 3.6  01/10/2015 4.3   Magnesium (mg/dL)  Date Value  03/09/2021 2.0  06/22/2014 1.7 (L)   Calcium (mg/dL)  Date Value  03/09/2021 7.1 (L)   Calcium, Total (mg/dL)  Date Value  01/10/2015 9.2   Albumin (g/dL)  Date Value  03/04/2021 3.3 (L)  06/22/2014 3.2 (L)   Phosphorus (mg/dL)  Date Value  03/09/2021 5.5 (H)  06/22/2014 2.7   Sodium (mmol/L)  Date Value  03/09/2021 137  01/10/2015 142   Corrected Ca: 7.16  Assessment: [name] is a 75 y.o. male admitted on 02/13/2021 with sepsis thought to be secondary to UTI. Pt is on HD MWF. Pharmacy has been consulted to assist in electrolyte monitoring and replacement as indicated.  Goal of Therapy:  K: 4 - 5.1  Mg: 2 - 2.4 All other electrolytes within normal limits   Plan:  - K 3.6 - will order KCl 10 mEq PO x 1 dose - Ca 7.1 (alb 3.3) Corrected Ca= 7.7) - ordered 1000 mg elemental Ca replacement with calcium carbonate - Phos 5.5 - on Renvela 1600 mg PO TID by nephro.  - Mag 2.0  No supplementation at this time. - Monitor electrolytes with AM lab  Chinita Greenland PharmD Clinical Pharmacist 03/09/2021

## 2021-03-09 NOTE — Progress Notes (Signed)
[place], [place] 03/09/21  Subjective:   LOS: 4  [name] is a 75 year old male currently followed by our office, Dr. Holley Raring.  He has a past medical history of ESRD on HD MWF, history of DVT on Eliquis, essential hypertension, chronic anxiety and depression, type 2 diabetes, and hyperlipidemia.  He presents to the emergency room with progressive shortness of breath and weakness for 5 days.  He was found to have a flutter on arrival to the ED with a heart rate of 160 and low BP.    Patient seen in the ICU today Patient major concern today visit was if it was okay for him to do peritoneal dialysis?  I answered patient's questions about modalities of renal placement therapy to the best of my ability I then examined the patient and discussed his rest of the renal related issues.  Objective:  Vital signs in last 24 hours:  Temp:  [97.2 F (36.2 C)-98.5 F (36.9 C)] 98.5 F (36.9 C) (03/26 0414) Pulse Rate:  [33-131] 91 (03/26 1000) Resp:  [14-32] 23 (03/26 1000) BP: (83-137)/(49-97) 92/64 (03/26 1000) SpO2:  [72 %-100 %] 88 % (03/26 1000) Weight:  [117.8 kg] 117.8 kg (03/26 0500)  Weight change: 1.9 kg Filed Weights   03/06/21 0621 03/10/2021 1030 03/09/21 0500  Weight: 117.7 kg 115.9 kg 117.8 kg    Intake/Output:    Intake/Output Summary (Last 24 hours) at 03/09/2021 1120 Last data filed at 03/09/2021 0600 Gross per 24 hour  Intake 1323.04 ml  Output 1150 ml  Net 173.04 ml     Physical Exam: General: No acute distress  HEENT Normocephalic. Atraumatic, moist oral mucosa  Pulm/lungs Minimal basilar rales, mild respiratory stress  CVS/Heart Regular this AM  Abdomen:  Soft, non tender  Extremities: trace peripheral edema  Neurologic: Awake, alert, conversant  Skin: No acute skin rash  Access: Rt IJ Permcth       Basic Metabolic Panel:  Recent Labs  Lab 03/02/2021 1325 03/06/21 0548 02/15/2021 0420 03/08/21 0632 03/09/21 0543  NA  137 138 136 136 137  K 3.7 4.0 4.0 3.6 3.6  CL 96* 97* 96* 96* 99  CO2 25 25 25 23 26   GLUCOSE 204* 111* 265* 158* 138*  BUN 64* 72* 54* 69* 47*  CREATININE 6.23* 7.12* 5.85* 7.29* 6.00*  CALCIUM 7.0* 6.7* 7.0* 6.6* 7.1*  MG  --  1.8 1.8 2.1 2.0  PHOS  --  9.0* 7.3* 8.7* 5.5*     CBC: Recent Labs  Lab 03/10/2021 1325 03/06/21 0548 03/06/21 2101 03/06/2021 0420 03/08/21 0632 03/09/21 0543  WBC 8.1 9.2 7.4 9.8 8.7 7.7  NEUTROABS 6.7 7.4 5.9 8.6*  --   --   HGB 9.3* 9.3* 9.4* 9.3* 9.4* 9.0*  HCT 30.2* 30.8* 31.2* 31.2* 31.0* 29.9*  MCV 95.3 97.5 97.5 98.1 96.9 97.1  PLT 300 295 279 342 286 261      Lab Results  Component Value Date   HEPBSAG NON REACTIVE 03/08/2021   HEPBSAB NON REACTIVE 10/02/2020   HEPBIGM NON REACTIVE 10/02/2020      Microbiology:  Recent Results (from the past 240 hour(s))  Urine Culture     Status: None   Collection Time: 02/21/2021  2:51 PM   Specimen: Urine, Random  Result Value Ref Range Status   Specimen Description   Final    URINE, RANDOM Performed at [facility], [address], [place], [place] [address]    Special Requests  Final    NONE Performed at [facility], [address] [place], [place] [address]    Culture   Final    NO GROWTH Performed at [facility], [place] [address], [place], [place] [address]    Report Status 03/06/2021 FINAL  Final  Resp Panel by RT-PCR (Flu A&B, Covid) Nasopharyngeal Swab     Status: None   Collection Time: 02/28/2021  3:45 PM   Specimen: Nasopharyngeal Swab; Nasopharyngeal(NP) swabs in vial transport medium  Result Value Ref Range Status   SARS Coronavirus 2 by RT PCR NEGATIVE NEGATIVE Final    Comment: (NOTE) SARS-CoV-2 target nucleic acids are NOT DETECTED.  The SARS-CoV-2 RNA is generally detectable in upper respiratory specimens during the acute phase of infection. The lowest concentration of SARS-CoV-2 viral copies this assay can detect is 138 copies/mL. A  negative result does not preclude SARS-Cov-2 infection and should not be used as the sole basis for treatment or other patient management decisions. A negative result may occur with  improper specimen collection/handling, submission of specimen other than nasopharyngeal swab, presence of viral mutation(s) within the areas targeted by this assay, and inadequate number of viral copies(<138 copies/mL). A negative result must be combined with clinical observations, patient history, and epidemiological information. The expected result is Negative.  Fact Sheet for Patients:  EntrepreneurPulse.com.au  Fact Sheet for Healthcare Providers:  IncredibleEmployment.be  This test is no t yet approved or cleared by the [place] FDA and  has been authorized for detection and/or diagnosis of SARS-CoV-2 by FDA under an Emergency Use Authorization (EUA). This EUA will remain  in effect (meaning this test can be used) for the duration of the COVID-19 declaration under Section 564(b)(1) of the Act, 21 U.S.C.section 360bbb-3(b)(1), unless the authorization is terminated  or revoked sooner.       Influenza A by PCR NEGATIVE NEGATIVE Final   Influenza B by PCR NEGATIVE NEGATIVE Final    Comment: (NOTE) The Xpert Xpress SARS-CoV-2/FLU/RSV plus assay is intended as an aid in the diagnosis of influenza from Nasopharyngeal swab specimens and should not be used as a sole basis for treatment. Nasal washings and aspirates are unacceptable for Xpert Xpress SARS-CoV-2/FLU/RSV testing.  Fact Sheet for Patients: EntrepreneurPulse.com.au  Fact Sheet for Healthcare Providers: IncredibleEmployment.be  This test is not yet approved or cleared by the [place] FDA and has been authorized for detection and/or diagnosis of SARS-CoV-2 by FDA under an Emergency Use Authorization (EUA). This EUA will remain in effect (meaning this test can  be used) for the duration of the COVID-19 declaration under Section 564(b)(1) of the Act, 21 U.S.C. section 360bbb-3(b)(1), unless the authorization is terminated or revoked.  Performed at [facility], [address] [place], [place] [address]   CULTURE, BLOOD (ROUTINE X 2) w Reflex to ID Panel     Status: None (Preliminary result)   Collection Time: 03/03/2021  6:17 PM   Specimen: BLOOD  Result Value Ref Range Status   Specimen Description BLOOD BLOOD RIGHT HAND  Final   Special Requests   Final    BOTTLES DRAWN AEROBIC AND ANAEROBIC Blood Culture adequate volume   Culture   Final    NO GROWTH 4 DAYS Performed at [facility], [address] [place], [place] [address]    Report Status PENDING  Incomplete  CULTURE, BLOOD (ROUTINE X 2) w Reflex to ID Panel     Status: None (Preliminary result)   Collection Time: 02/12/2021  6:18 PM   Specimen: BLOOD  Result Value Ref Range Status   Specimen Description BLOOD RIGHT ANTECUBITAL  Final   Special Requests   Final    BOTTLES DRAWN AEROBIC AND ANAEROBIC Blood Culture adequate volume   Culture   Final    NO GROWTH 4 DAYS Performed at [facility], [address], [place], [place] [address]    Report Status PENDING  Incomplete  MRSA PCR Screening     Status: None   Collection Time: 03/06/21  9:20 AM   Specimen: Nasopharyngeal  Result Value Ref Range Status   MRSA by PCR NEGATIVE NEGATIVE Final    Comment:        The GeneXpert MRSA Assay (FDA approved for NASAL specimens only), is one component of a comprehensive MRSA colonization surveillance program. It is not intended to diagnose MRSA infection nor to guide or monitor treatment for MRSA infections. Performed at [facility], [address] [place], [place] [address]   Body fluid culture w Gram Stain     Status: None (Preliminary result)   Collection Time: 03/05/2021  2:03 PM   Specimen: Pericardial; Body Fluid  Result Value Ref Range  Status   Specimen Description   Final    PERICARDIAL Performed at [facility], [address] [place], [place] [address]    Special Requests   Final    PERICARDIAL Performed at [facility], [place] [place], [place] [address]    Gram Stain   Final    ABUNDANT WBC PRESENT,BOTH PMN AND MONONUCLEAR NO ORGANISMS SEEN    Culture   Final    NO GROWTH 2 DAYS Performed at [facility], [place] [address] [place], [place] [address]    Report Status PENDING  Incomplete    Coagulation Studies: Recent Labs    02/19/2021 1051  LABPROT 20.0*  INR 1.8*    Urinalysis: No results for input(s): COLORURINE, LABSPEC, PHURINE, GLUCOSEU, HGBUR, BILIRUBINUR, KETONESUR, PROTEINUR, UROBILINOGEN, NITRITE, LEUKOCYTESUR in the last 72 hours.  Invalid input(s): APPERANCEUR    Imaging: CARDIAC CATHETERIZATION  Result Date: 03/08/2021  Successful pericardiocentesis  Removal of 300 cc of bloody fluid  Drain was not left in place  Slight improvement in systolic blood pressure at the end of procedure  Conclusion Successful pericardiocentesis under ultrasound guidance subxiphoid approach 300 cc of bloody fluid was removed without difficulty No complications Patient tolerated procedure well   DG Chest Port 1 View  Result Date: 03/08/2021 CLINICAL DATA:  Shortness of breath. EXAM: PORTABLE CHEST 1 VIEW COMPARISON:  Radiograph March 07, 2021 FINDINGS: Right IJ central venous catheter tip projecting over the superior cavoatrial junction. Similar generalized cardiac enlargement. Pulmonary vascular congestion. Small left greater than right pleural effusions. Left basilar atelectasis. No focal consolidation or overt pulmonary edema. The visualized skeletal structures are unchanged. IMPRESSION: No significant interval change in the enlarged cardiac silhouette and small pleural effusion. Similar bibasilar atelectasis. Electronically Signed   By: Dahlia Bailiff MD   On: 03/08/2021  21:08   DG Chest Port 1 View  Result Date: 02/12/2021 CLINICAL DATA:  Status post pericardiocentesis with chest discomfort EXAM: PORTABLE CHEST 1 VIEW COMPARISON:  March 07, 2021 study obtained earlier in the day. FINDINGS: Central catheter tip is in the superior vena cava near the cavoatrial junction, stable. No pneumothorax. There is a small left pleural effusion with left lower lobe atelectatic change. There is also atelectatic change in the medial right base. There is generalized  cardiac enlargement with pulmonary vascularity within normal limits. No adenopathy. No bone lesions IMPRESSION: Persistent enlargement of the cardiac silhouette. There may well be underlying pericardial effusion. There is a small left pleural effusion. There is bibasilar atelectasis, similar to earlier in the day. No pneumothorax. Central catheter tip near cavoatrial junction in the superior vena cava. Electronically Signed   By: Lowella Grip III M.D.   On: 02/20/2021 16:23   ECHOCARDIOGRAM LIMITED  Result Date: 02/22/2021    ECHOCARDIOGRAM LIMITED REPORT   Patient Name:   ANAKIN VARKEY Soper Date of Exam: 02/24/2021 Medical Rec #:  [id number]             Height:       72.0 in Accession #:    [id number]            Weight:       255.5 lb Date of Birth:  04/02/1946            BSA:          2.364 m Patient Age:    64 years              BP:           Not listed on flowsheet/Not                                                     listed on flowsheet mmHg Patient Gender: M                     HR:           Not listed in flowsheet bpm. Exam Location:  [facility] Procedure: Limited Echo, Cardiac Doppler and Color Doppler Indications:     Pericardial Effusion I31.3  History:         Patient has prior history of Echocardiogram examinations, most                  recent 03/06/2021. COPD; Risk Factors:Hypertension.  Sonographer:     Sherrie Sport RDCS (AE) Referring Phys:  [id number] Teodoro Spray Diagnosing Phys: Yolonda Kida MD IMPRESSIONS   1. Improved pericardial effusion only mild from mod.  2. Left ventricular ejection fraction, by estimation, is 70 to 75%. Left ventricular ejection fraction by PLAX is 74 %. The left ventricle has hyperdynamic function. The left ventricle has no regional wall motion abnormalities. Left ventricular diastolic parameters were normal.  3. Right ventricular systolic function is normal. The right ventricular size is normal.  4. A small pericardial effusion is present.  5. The mitral valve is normal in structure. No evidence of mitral valve regurgitation.  6. The aortic valve is normal in structure. Aortic valve regurgitation is not visualized. FINDINGS  Left Ventricle: Left ventricular ejection fraction, by estimation, is 70 to 75%. Left ventricular ejection fraction by PLAX is 74 %. The left ventricle has hyperdynamic function. The left ventricle has no regional wall motion abnormalities. There is no left ventricular hypertrophy. Left ventricular diastolic parameters were normal. Right Ventricle: The right ventricular size is normal. No increase in right ventricular wall thickness. Right ventricular systolic function is normal. Left Atrium: Left atrial size was not assessed. Right Atrium: Right atrial size was not assessed. Pericardium: A small pericardial effusion is present. Mitral Valve: The mitral valve is normal in structure.  Tricuspid Valve: The tricuspid valve is normal in structure. Tricuspid valve regurgitation is not demonstrated. Aortic Valve: The aortic valve is normal in structure. Aortic valve regurgitation is not visualized. Pulmonic Valve: The pulmonic valve was grossly normal. Aorta: The ascending aorta was not well visualized. Additional Comments: Improved pericardial effusion only mild from mod. LEFT VENTRICLE PLAX 2D LV EF:         Left ventricular ejection fraction by PLAX is 74 %. LVIDd:         4.80 cm LVIDs:         2.74 cm LV PW:         1.68 cm LV IVS:        1.63 cm  LEFT ATRIUM         Index  LA diam:    4.00 cm 1.69 cm/m                        PULMONIC VALVE AORTA                 PV Vmax:        0.60 m/s Ao Root diam: 3.50 cm PV Peak grad:   1.4 mmHg                       RVOT Peak grad: 2 mmHg  TRICUSPID VALVE TR Peak grad:   5.2 mmHg TR Vmax:        114.00 cm/s Yolonda Kida MD Electronically signed by Yolonda Kida MD Signature Date/Time: 03/05/2021/11:36:20 PM    Final      Medications:   . sodium chloride Stopped (03/01/2021 0909)  . amiodarone 30 mg/hr (03/09/21 0600)  . cefTRIAXone (ROCEPHIN)  IV 2 g (03/09/21 1829)  . heparin 2,000 Units/hr (03/09/21 0646)  . norepinephrine (LEVOPHED) Adult infusion 2 mcg/min (03/11/2021 0443)  . phenylephrine (NEO-SYNEPHRINE) Adult infusion 40 mcg/min (03/09/21 0912)  . vasopressin Stopped (03/08/21 1035)   . Chlorhexidine Gluconate Cloth  6 each Topical Q0600  . DULoxetine  120 mg Oral Daily  . epoetin (EPOGEN/PROCRIT) injection  4,000 Units Intravenous Q M,W,F-HD  . ezetimibe  10 mg Oral Daily  . insulin aspart  0-9 Units Subcutaneous Q4H  . mouth rinse  15 mL Mouth Rinse BID  . midodrine  10 mg Oral Q M,W,F-HD  . mirtazapine  45 mg Oral QHS  . pantoprazole  40 mg Oral BID  . potassium chloride  10 mEq Oral Once  . rOPINIRole  4 mg Oral BID AC  . rosuvastatin  10 mg Oral QHS  . sevelamer carbonate  1,600 mg Oral TID WC  . sodium chloride flush  3 mL Intravenous Q12H  . sucralfate  1 g Oral TID   acetaminophen, ALPRAZolam, benzonatate, clonazepam, guaiFENesin, hydrocortisone cream, lidocaine HCl (PF), metoprolol tartrate  Assessment/ Plan:  75 y.o. male with past medical history of ESRD on HD MWF, history of DVT on Eliquis, essential hypertension, chronic anxiety and depression, type 2 diabetes, and hyperlipidemia was admitted on 03/07/2021   Active Problems:   UTI (urinary tract infection)  UTI (urinary tract infection) [N39.0] Acute cystitis without hematuria [N30.00] Generalized weakness [R53.1] Atypical atrial  flutter ([place]) [I48.4]  [facility]/MWF/ Rt IJ Permcath  #. Renal  ESRD Pt is on hemodialysis. Patient is on Monday Wednesday Friday schedule Patient was last dialyzed yesterday No need for renal placement therapy today   #. Anemia of CKD  Lab  Results  Component Value Date   HGB 9.0 (L) 03/09/2021  Patient hemoglobin is at goal Patient is on Epogen protocol For now we will continue Epogen 4000 units IV with dialysis..  #. Secondary hyperparathyroidism of renal origin N 25.81   Patient has history of secondary hyperparathyroidism with intact PTH levels being high and 137 as an outpatient   Lab Results  Component Value Date   PHOS 5.5 (H) 03/09/2021   Patient phosphorus on the higher side so patient was started on binders - Renvela 2 tablets p.o. 3 times daily with meals.  #. Diabetes type 2 with CKD Hemoglobin A1C (%)  Date Value  01/27/2014 5.5   Hgb A1c MFr Bld (%)  Date Value  03/06/2021 5.7 (H)  Stable with diet . # Hypotension Blood pressure is now stable Patient is on midodrine  #Pericardial effusion.  Patient is now status post pericardiocentesis.   Plan No need for renal replacement therapy today   LOS: 4 Katrina Brosh s [facility] 3/26/202211:20 Kimberly, Elbert

## 2021-03-09 NOTE — Progress Notes (Signed)
NAME:  [name], MRN:  [id number], DOB:  Nov 06, 1946, LOS: 4 ADMISSION DATE:  02/22/2021, CONSULTATION DATE: 03/06/2021 REFERRING MD: Sharion Settler, NP, CHIEF COMPLAINT: Hypotension    History of Present Illness:  This is a 75 yo male who presented to [facility] ER via EMS on 03/22 from home via EMS with c/o shortness of breath, fatigue, and weakness onset several weeks priors to presentation.  He has a hx of ESRD requiring HD M-W-F, last HD session 03/21, however unable to complete the full session due to hypotension. At baseline he voids some although in the last few days he has been unable to void. He reports he has had weakness and shortness of breath after he had a clogged dialysis catheter that required catheter lysis on 03/9.  He subsequently presented to the ER on 03/10 with chest pain.  CTA Chest performed which was negative for PE although dilated pulmonary arteries were noted along with a small pericardial effusion, and 3-vessel coronary atherosclerosis.  During that presentation bp marginal spb 90's.  ER physician discussed the case with on call Nephrologist Dr. Harvest Dark who advised he would monitor the pt closely during his scheduled HD session the next day, and pt did not require hospital admission.  ED course Upon arrival to the ER EKG revealed atrial flutter vs. atrial fibrillation with rvr.  Pt also hypotensive and diaphoretic.  He subsequently received low dose etomidate and underwent synchronized cardioversion which was successful cardiac rhythm sinus rhythm.  Pts hypotension improved post cardioversion.  CXR concerning for pericardial effusion, left pleural effusion, and left lower lobe airspace disease. UA concerning for UTI. Abx therapy initiated. ER physician consulted cardiology and nephrology. Hospitalist team admitted pt to the medsurg unit for additional workup and treatment.   Hospital course On 03/23 pt required transfer to the stepdown unit with wide complex  tachycardia hr. 140-150's and hypotension sbp 60-70's.  He required amiodarone bolus followed by continuous gtt and peripheral levophed.  PCCM team consulted to assist with management.    Pertinent  Medical History  OSA Renal Cell Carcinoma HTN HLD Type II Diabetes Mellitus Depression COPD Chronic Back Pain ESRD HD M-W-F  Significant Hospital Events: Including procedures, antibiotic start and stop dates in addition to other pertinent events   03/22: Pt presented [facility] ER with weakness and shortness of breath.  Found to be hypotensive and cardiac rhythm atrial fibrillation with rvr.  Underwent successful cardioversion with improvement in bp 03/23: Pt admitted to the medsurg unit per hospitalist team for additional workup and treatment 03/23: Pt required transfer to the stepdown unit with wide complex tachycardia and hypotension requiring levophed and amiodarone gtts. PCCM team consulted to assist with management  Interim History / Subjective:  Pt c/o mild shortness of breath and cough   Objective   Blood pressure 115/88, pulse (!) 47, temperature 98 F (36.7 C), temperature source Oral, resp. rate (!) 21, height 6' (1.829 m), weight 117.8 kg, SpO2 (!) 82 %.        Intake/Output Summary (Last 24 hours) at 03/09/2021 1729 Last data filed at 03/09/2021 0600 Gross per 24 hour  Intake 1323.04 ml  Output 1150 ml  Net 173.04 ml   Filed Weights   03/06/21 0621 02/19/2021 1030 03/09/21 0500  Weight: 117.7 kg 115.9 kg 117.8 kg   Examination: General: acutely ill appearing male, resting in bed NAD  HENT: supple, no JVD  Lungs: diminished throughout, even, non labored  Cardiovascular: sinus tachycardia, no R/G, 2+  radial/1+distal pulses, trace bilateral lower extremity edema  Abdomen: +BS x4, obese, soft, non distended, non tender  Extremities: left leg bulk larger which is his baseline, moves all extremities  Neuro: alert and oriented, follows commands GU: currently anuric    Labs/imaging that I havepersonally reviewed   03/23: BUN 72, creatinine 7.12, calcium 6.7, anion gap 16, phosphorous 9.0, albumin 3.1, lactic acid 2.2, pct 1.05, BNP 173.2, hgb 9.4, TSH 3.513, free T4 0.81, respiratory panel by rt-pcr negative, and UA concerning for UTI   CXR concerning for cardiomegaly, vascular congestion, small bilateral effusions, and left base atelectasis   Resolved Hospital Problem list   N/A  Assessment & Plan:   Acute on chronic respiratory failure secondary to vascular congestion in the setting of ESRD LLL atelectasis  Hx: COPD and OSA Prn supplemental O2 for dyspnea and/or hypoxia  Aggressive pulmonary hygiene Prn bronchodilator therapy  HD per nephrology recommendations Much improved today   Cardiovascular: Wide complex tachycardia  Atrial fibrillation with rvr s/p synchronized cardioversion initially on converted to sinus rhythm 03/22 Echo 03/23~moderate pericardial effusion no evidence of cardiac tamponade Mildly elevated troponin likely demand ischemia  Hypotension likely cardiogenic shock  Hx: HTN and HLD Rate control with amio and planned cardioversion Pressors converted to neo/vaso Plan for pericardiocentesis, cytology pending from sample Do not feel hemodynamics and dysthymias are from sepsis (not febrile, no left shift, anticipated PCT) Continues to improve from yesterday   ESRD on HD M-W-F  Hx: Renal cell carcinoma  Trend BMP  Replace electrolytes as indicated  Avoid nephrotoxic medications  Nephrology consulted appreciate input, will need volume reduction and perhaps off day HD Pt does occasionally void although has voided in several days will bladder scan to assess for urinary retention   UTI Trend WBC and monitor fever curve Trend PCT  Follow cultures  Continue zosyn for now   Type II diabetes mellitus  Monitor serum glucose If glucose >150 will start SSI  Depression  Continue remeron and cymbalta    Best practice  (evaluated daily)  Diet:  Oral Pain/Anxiety/Delirium protocol (if indicated): No VAP protocol (if indicated): Not indicated DVT prophylaxis: Systemic AC GI prophylaxis: PPI Glucose control:  SSI No Central venous access:  N/A Arterial line:  N/A Foley:  N/A Mobility:  bed rest  PT consulted: N/A Last date of multidisciplinary goals of care discussion [N/A] Code Status:  full code Disposition: ICU  Labs   CBC: Recent Labs  Lab 03/01/2021 1325 03/06/21 0548 03/06/21 2101 03/13/2021 0420 03/08/21 0632 03/09/21 0543  WBC 8.1 9.2 7.4 9.8 8.7 7.7  NEUTROABS 6.7 7.4 5.9 8.6*  --   --   HGB 9.3* 9.3* 9.4* 9.3* 9.4* 9.0*  HCT 30.2* 30.8* 31.2* 31.2* 31.0* 29.9*  MCV 95.3 97.5 97.5 98.1 96.9 97.1  PLT 300 295 279 342 286 008    Basic Metabolic Panel: Recent Labs  Lab 03/06/21 0548 03/11/2021 0420 03/08/21 0632 03/09/21 0543 03/09/21 1051  NA 138 136 136 137 136  K 4.0 4.0 3.6 3.6 3.5  CL 97* 96* 96* 99 98  CO2 25 25 23 26 26   GLUCOSE 111* 265* 158* 138* 154*  BUN 72* 54* 69* 47* 49*  CREATININE 7.12* 5.85* 7.29* 6.00* 6.22*  CALCIUM 6.7* 7.0* 6.6* 7.1* 7.3*  MG 1.8 1.8 2.1 2.0  --   PHOS 9.0* 7.3* 8.7* 5.5*  --    GFR: Estimated Creatinine Clearance: 14.2 mL/min (A) (by C-G formula based on SCr of 6.22 mg/dL (H)).  Recent Labs  Lab 03/06/21 2101 03/06/21 2324 02/21/2021 0420 03/08/21 0632 03/09/21 0543  PROCALCITON 1.05  --  1.01 1.56  --   WBC 7.4  --  9.8 8.7 7.7  LATICACIDVEN 2.2* 1.6  --   --   --     Liver Function Tests: Recent Labs  Lab 03/14/2021 1325 03/06/21 0548 02/23/2021 0420 03/09/21 1051  AST 142* 74* 104* 49*  ALT 110* 98* 128* 103*  ALKPHOS 88 83 91 77  BILITOT 0.9 0.9 0.9 0.7  PROT 6.4* 6.6 6.6 6.3*  ALBUMIN 3.0* 3.1* 3.3* 2.8*   No results for input(s): LIPASE, AMYLASE in the last 168 hours. No results for input(s): AMMONIA in the last 168 hours.  ABG No results found for: PHART, PCO2ART, PO2ART, HCO3, TCO2, ACIDBASEDEF, O2SAT    Coagulation Profile: Recent Labs  Lab 02/13/2021 1051  INR 1.8*    Cardiac Enzymes: No results for input(s): CKTOTAL, CKMB, CKMBINDEX, TROPONINI in the last 168 hours.  HbA1C: Hemoglobin A1C  Date/Time Value Ref Range Status  01/27/2014 05:31 AM 5.5 4.2 - 6.3 % Final    Comment:    The American Diabetes Association recommends that a primary goal of therapy should be <7% and that physicians should reevaluate the treatment regimen in patients with HbA1c values consistently >8%.    Hgb A1c MFr Bld  Date/Time Value Ref Range Status  03/06/2021 05:48 AM 5.7 (H) 4.8 - 5.6 % Final    Comment:    (NOTE) Pre diabetes:          5.7%-6.4%  Diabetes:              >6.4%  Glycemic control for   <7.0% adults with diabetes   11/08/2020 04:39 AM 5.6 4.8 - 5.6 % Final    Comment:    (NOTE) Pre diabetes:          5.7%-6.4%  Diabetes:              >6.4%  Glycemic control for   <7.0% adults with diabetes     CBG: Recent Labs  Lab 03/09/21 0013 03/09/21 0442 03/09/21 0735 03/09/21 1057 03/09/21 1547  GLUCAP 159* 127* 124* 143* 167*    Review of Systems: Positives in BOLD   Gen: fever, chills, weight change, fatigue, night sweats HEENT: Denies blurred vision, double vision, hearing loss, tinnitus, sinus congestion, rhinorrhea, sore throat, neck stiffness, dysphagia PULM: shortness of breath, cough, sputum production, hemoptysis, wheezing CV: Denies chest pain, edema, orthopnea, paroxysmal nocturnal dyspnea, palpitations GI: Denies abdominal pain, nausea, vomiting, diarrhea, hematochezia, melena, constipation, change in bowel habits GU: Denies dysuria, hematuria, polyuria, oliguria, urethral discharge Endocrine: Denies hot or cold intolerance, polyuria, polyphagia or appetite change Derm: Denies rash, dry skin, scaling or peeling skin change Heme: Denies easy bruising, bleeding, bleeding gums Neuro: headache, numbness, weakness, slurred speech, loss of memory or  consciousness   Past Medical History:  He,  has a past medical history of Chronic back pain, COPD (chronic obstructive pulmonary disease) ([place]), Depression, Diabetes mellitus without complication ([place]), Hyperlipidemia, Hypertension, Renal cell carcinoma ([place]), and Sleep apnea.   Surgical History:   Past Surgical History:  Procedure Laterality Date  . CARPAL TUNNEL RELEASE    . CHOLECYSTECTOMY    . COLONOSCOPY WITH PROPOFOL N/A 07/29/2017   Procedure: COLONOSCOPY WITH PROPOFOL;  Surgeon: Manya Silvas, MD;  Location: [facility] ENDOSCOPY;  Service: Endoscopy;  Laterality: N/A;  . colonoscopys    . DIALYSIS/PERMA CATHETER INSERTION N/A 10/09/2020  Procedure: DIALYSIS/PERMA CATHETER INSERTION;  Surgeon: Katha Cabal, MD;  Location: [place] [facility] LAB;  Service: Cardiovascular;  Laterality: N/A;  . ESOPHAGOGASTRODUODENOSCOPY (EGD) WITH PROPOFOL N/A 07/29/2017   Procedure: ESOPHAGOGASTRODUODENOSCOPY (EGD) WITH PROPOFOL;  Surgeon: Manya Silvas, MD;  Location: [facility] ENDOSCOPY;  Service: Endoscopy;  Laterality: N/A;  . FLEXIBLE SIGMOIDOSCOPY    . GASTRIC BYPASS    . NEPHRECTOMY    . open reduction and internal fixation, right distal radius    . PERICARDIOCENTESIS N/A 03/11/2021   Procedure: PERICARDIOCENTESIS;  Surgeon: Yolonda Kida, MD;  Location: [place] [facility];  Service: Cardiovascular;  Laterality: N/A;  . right knee arthroscopy    . TEMPORARY DIALYSIS CATHETER N/A 10/02/2020   Procedure: TEMPORARY DIALYSIS CATHETER;  Surgeon: Katha Cabal, MD;  Location: [place] [facility];  Service: Cardiovascular;  Laterality: N/A;  . UPPER GI ENDOSCOPY       Social History:   reports that he has quit smoking. He has a 30.00 pack-year smoking history. His smokeless tobacco use includes chew. He reports that he does not drink alcohol and does not use drugs.   Family History:  His family history includes Cancer in his mother; Diabetes in his mother; Heart disease in his  father.   Allergies Allergies  Allergen Reactions  . Bupropion     Other reaction(s): Other (See Comments) Mood swings, angry Other reaction(s): Other (See Comments) Mood swings, angry     Home Medications  Prior to Admission medications   Medication Sig Start Date End Date Taking? Authorizing Provider  apixaban (ELIQUIS) 2.5 MG TABS tablet Take 1 tablet (2.5 mg total) by mouth 2 (two) times daily. 10/12/20  Yes Swayze, Ava, DO  carvedilol (COREG) 6.25 MG tablet Take 6.25 mg by mouth 2 (two) times daily with a meal.   Yes [provider]  DULoxetine (CYMBALTA) 60 MG capsule Take 120 mg by mouth daily.  09/01/19  Yes [provider]  ezetimibe (ZETIA) 10 MG tablet Take by mouth. 11/22/20 11/22/21 Yes [provider]  glipiZIDE (GLUCOTROL XL) 5 MG 24 hr tablet Take 5 mg by mouth daily. 07/25/20  Yes [provider]  mirtazapine (REMERON) 45 MG tablet Take 45 mg by mouth at bedtime.  07/05/20  Yes [provider]  multivitamin (RENA-VIT) TABS tablet Take 1 tablet by mouth at bedtime. 10/12/20  Yes Swayze, Ava, DO  pantoprazole (PROTONIX) 40 MG tablet Take 40 mg by mouth 2 (two) times daily.    Yes [provider]  rOPINIRole (REQUIP) 4 MG tablet Take 1 tablet (4 mg total) by mouth 2 (two) times daily before a meal. 10/12/20  Yes Swayze, Ava, DO  rosuvastatin (CRESTOR) 10 MG tablet Take 1 tablet (10 mg total) by mouth daily. 11/09/20  Yes Pokhrel, Laxman, MD  sucralfate (CARAFATE) 1 g tablet Take 1 g by mouth 3 (three) times daily.    Yes [provider]

## 2021-03-09 NOTE — Progress Notes (Signed)
  Amiodarone Drug - Drug Interaction Consult Note  Recommendations:  QTc has increased. Would evaluate.  BP 92/64          EKG 3/22 @ 1538: QTc 492          EKG 3/25 @2020 : QTc 559  AST: 142>74>104, none 3/26 ALT: 110>98>128, none 3/26 Continue to monitor blood pressure, QTc, and AST/ALT daily while on amiodarone.   Amiodarone is metabolized by the cytochrome P450 system and therefore has the potential to cause many drug interactions. Amiodarone has an average plasma half-life of 50 days (range 20 to 100 days).   There is potential for drug interactions to occur several weeks or months after stopping treatment and the onset of drug interactions may be slow after initiating amiodarone.   [x]  Statins: Increased risk of myopathy. Simvastatin- restrict dose to 20mg  daily. Other statins: counsel patients to report any muscle pain or weakness immediately. On Rosuvastatin  []  Anticoagulants: Amiodarone can increase anticoagulant effect. Consider warfarin dose reduction. Patients should be monitored closely and the dose of anticoagulant altered accordingly, remembering that amiodarone levels take several weeks to stabilize.  []  Antiepileptics: Amiodarone can increase plasma concentration of phenytoin, the dose should be reduced. Note that small changes in phenytoin dose can result in large changes in levels. Monitor patient and counsel on signs of toxicity.  []  Beta blockers: increased risk of bradycardia, AV block and myocardial depression. Sotalol - avoid concomitant use.   []   Calcium channel blockers (diltiazem and verapamil): increased risk of bradycardia, AV block and myocardial depression.  []   Cyclosporine: Amiodarone increases levels of cyclosporine. Reduced dose of cyclosporine is recommended.  []  Digoxin dose should be halved when amiodarone is started.  []  Diuretics: increased risk of cardiotoxicity if hypokalemia occurs.  []  Oral hypoglycemic agents (glyburide, glipizide,  glimepiride): increased risk of hypoglycemia. Patient's glucose levels should be monitored closely when initiating amiodarone therapy.   [x]  Drugs that prolong the QT interval:  Torsades de pointes risk may be increased with concurrent use - avoid if possible.  Monitor QTc, also keep magnesium/potassium WNL if concurrent therapy can't be avoided. Marland Kitchen Antibiotics: e.g. fluoroquinolones, erythromycin. . Antiarrhythmics: e.g. quinidine, procainamide, disopyramide, sotalol. . Antipsychotics: e.g. phenothiazines, haloperidol.  . Lithium, tricyclic antidepressants, and methadone.  On Mirtazapine- can prolong QTc    Thank You,  Vondra Aldredge A  03/09/2021 10:48 AM

## 2021-03-09 NOTE — Progress Notes (Signed)
[facility] Cardiology    SUBJECTIVE: Patient presented with shortness of breath dyspnea.  Found to have a moderate pericardial effusion status post pericardiocentesis patient is receiving dialysis therapy for end-stage renal disease still having hypotension on pressors and broad-spectrum antibiotic therapy somewhat improved.  Back in atrial flutter.  Denies any pain no fever chills or sweats still hypoxic   Vitals:   03/09/21 1200 03/09/21 1300 03/09/21 1400 03/09/21 1500  BP: 114/62 115/71 115/77 115/88  Pulse: (!) 132 (!) 34 (!) 43 (!) 47  Resp: (!) 28 (!) 22 (!) 21 (!) 21  Temp: 98 F (36.7 C)     TempSrc: Oral     SpO2: 93% (!) 87% (!) 89% (!) 82%  Weight:      Height:         Intake/Output Summary (Last 24 hours) at 03/09/2021 1515 Last data filed at 03/09/2021 0600 Gross per 24 hour  Intake 1323.04 ml  Output 1150 ml  Net 173.04 ml      PHYSICAL EXAM  General: Well developed, well nourished, in no acute distress HEENT:  Normocephalic and atramatic Neck:  No JVD.  Lungs: Clear bilaterally to auscultation and percussion. Heart: HRRR . Normal S1 and S2 without gallops or murmurs.  Abdomen: Bowel sounds are positive, abdomen soft and non-tender  Msk:  Back normal, normal gait. Normal strength and tone for age. Extremities: No clubbing, cyanosis or edema.   Neuro: Alert and oriented X 3. Psych:  Good affect, responds appropriately   LABS: Basic Metabolic Panel: Recent Labs    03/08/21 0632 03/09/21 0543 03/09/21 1051  NA 136 137 136  K 3.6 3.6 3.5  CL 96* 99 98  CO2 23 26 26   GLUCOSE 158* 138* 154*  BUN 69* 47* 49*  CREATININE 7.29* 6.00* 6.22*  CALCIUM 6.6* 7.1* 7.3*  MG 2.1 2.0  --   PHOS 8.7* 5.5*  --    Liver Function Tests: Recent Labs    03/13/2021 0420 03/09/21 1051  AST 104* 49*  ALT 128* 103*  ALKPHOS 91 77  BILITOT 0.9 0.7  PROT 6.6 6.3*  ALBUMIN 3.3* 2.8*   No results for input(s): LIPASE, AMYLASE in the last 72 hours. CBC: Recent Labs     03/06/21 2101 03/06/2021 0420 03/08/21 0632 03/09/21 0543  WBC 7.4 9.8 8.7 7.7  NEUTROABS 5.9 8.6*  --   --   HGB 9.4* 9.3* 9.4* 9.0*  HCT 31.2* 31.2* 31.0* 29.9*  MCV 97.5 98.1 96.9 97.1  PLT 279 342 286 261   Cardiac Enzymes: No results for input(s): CKTOTAL, CKMB, CKMBINDEX, TROPONINI in the last 72 hours. BNP: Invalid input(s): POCBNP D-Dimer: No results for input(s): DDIMER in the last 72 hours. Hemoglobin A1C: No results for input(s): HGBA1C in the last 72 hours. Fasting Lipid Panel: No results for input(s): CHOL, HDL, LDLCALC, TRIG, CHOLHDL, LDLDIRECT in the last 72 hours. Thyroid Function Tests: Recent Labs    03/06/21 2101  TSH 3.513   Anemia Panel: No results for input(s): VITAMINB12, FOLATE, FERRITIN, TIBC, IRON, RETICCTPCT in the last 72 hours.  DG Chest Port 1 View  Result Date: 03/08/2021 CLINICAL DATA:  Shortness of breath. EXAM: PORTABLE CHEST 1 VIEW COMPARISON:  Radiograph March 07, 2021 FINDINGS: Right IJ central venous catheter tip projecting over the superior cavoatrial junction. Similar generalized cardiac enlargement. Pulmonary vascular congestion. Small left greater than right pleural effusions. Left basilar atelectasis. No focal consolidation or overt pulmonary edema. The visualized skeletal structures are unchanged. IMPRESSION: No  significant interval change in the enlarged cardiac silhouette and small pleural effusion. Similar bibasilar atelectasis. Electronically Signed   By: Dahlia Bailiff MD   On: 03/08/2021 21:08   DG Chest Port 1 View  Result Date: 03/02/2021 CLINICAL DATA:  Status post pericardiocentesis with chest discomfort EXAM: PORTABLE CHEST 1 VIEW COMPARISON:  March 07, 2021 study obtained earlier in the day. FINDINGS: Central catheter tip is in the superior vena cava near the cavoatrial junction, stable. No pneumothorax. There is a small left pleural effusion with left lower lobe atelectatic change. There is also atelectatic change in the  medial right base. There is generalized cardiac enlargement with pulmonary vascularity within normal limits. No adenopathy. No bone lesions IMPRESSION: Persistent enlargement of the cardiac silhouette. There may well be underlying pericardial effusion. There is a small left pleural effusion. There is bibasilar atelectasis, similar to earlier in the day. No pneumothorax. Central catheter tip near cavoatrial junction in the superior vena cava. Electronically Signed   By: Lowella Grip III M.D.   On: 02/14/2021 16:23     Echo initial echocardiogram with moderate pericardial effusion follow-up after pericardiocentesis is showed only mild effusion  TELEMETRY: Atrial flutter rate of around 90:  ASSESSMENT AND PLAN:  Active Problems:   UTI (urinary tract infection) Atrial fibrillation Hypertension End-stage renal disease History of pericardial effusion COPD Diabetes Hyperlipidemia Obesity History of DVT Hypoxemia . Plan Agree with ICU level care Continue weight control for atrial fibrillation Continue supplemental oxygen therapy Maintain treatment for hypertension with continued pressure Agree with dialysis therapy for end-stage renal disease Repeat echocardiogram for reaccumulation of pericardial effusion Maintain broad-spectrum antibiotic therapy for UTI Continue amiodarone for atrial fibrillation post cardioversion electrically Await pericardial effusion results    Yolonda Kida, MD 03/09/2021 3:15 PM

## 2021-03-09 NOTE — Consult Note (Signed)
[place] for heparin Indication: atrial fibrillation  Allergies  Allergen Reactions  . Bupropion     Other reaction(s): Other (See Comments) Mood swings, angry Other reaction(s): Other (See Comments) Mood swings, angry    Patient Measurements: Height: 6' (182.9 cm) Weight: 115.9 kg (255 lb 8.2 oz) IBW/kg (Calculated) : 77.6 Heparin Dosing Weight: 101.9  Vital Signs: Temp: 98 F (36.7 C) (03/26 0000) Temp Source: Oral (03/26 0000) BP: 96/59 (03/26 0000) Pulse Rate: 131 (03/26 0000)  Labs: Recent Labs    03/06/21 0548 03/06/21 2101 03/11/2021 0420 02/22/2021 1051 02/16/2021 2357 03/08/21 0632 03/08/21 1123 03/08/21 2312  HGB 9.3* 9.4* 9.3*  --   --  9.4*  --   --   HCT 30.8* 31.2* 31.2*  --   --  31.0*  --   --   PLT 295 279 342  --   --  286  --   --   APTT  --   --   --   --  61*  --  62* 69*  LABPROT  --   --   --  20.0*  --   --   --   --   INR  --   --   --  1.8*  --   --   --   --   HEPARINUNFRC  --   --   --   --   --  1.40*  --   --   CREATININE 7.12*  --  5.85*  --   --  7.29*  --   --     Estimated Creatinine Clearance: 12 mL/min (A) (by C-G formula based on SCr of 7.29 mg/dL (H)).   Medications:  Apixaban 2.5 mg BID prior to admission  Assessment: 75 yo M presented with respiratory distress with hypotension and afib with RVR, HR 160. Pt receives HD MWF. PMH includes COPD, depression, diabetes, HLD, and HTN. Pt with symptomatic afib on 3/24 - cardioverted and started on heparin gtt. Heparin gtt was stopped 3/24 while pt was in cath lab. Pharmacy consulted to restart and monitor heparin.   Goal of Therapy:  aPTT goal 66-102 Heparin level 0.3-0.7 units/ml Monitor platelets by anticoagulation protocol: Yes    3/24 2357 aPTT 61, subtherapeutic 3/25 1123 aPTT 62, subtherapeutic 3/25 2312 aPTT 69, therapeutic   Plan:  Continue heparin drip to 1800 units/hr Recheck aPTT with AM labs Will monitor via aPTT levels  until level correlates with heparin level since pt was on Eliquis prior to admission. Plan to restart Eliquis in approximately 24 hrs pending no additional procedures. Continue to monitor H&H and platelets  Renda Rolls, PharmD, [facility] 03/09/2021 12:17 AM

## 2021-03-09 NOTE — Consult Note (Signed)
[place] for heparin Indication: atrial fibrillation  Allergies  Allergen Reactions  . Bupropion     Other reaction(s): Other (See Comments) Mood swings, angry Other reaction(s): Other (See Comments) Mood swings, angry    Patient Measurements: Height: 6' (182.9 cm) Weight: 117.8 kg (259 lb 11.2 oz) IBW/kg (Calculated) : 77.6 Heparin Dosing Weight: 101.9  Vital Signs: Temp: 98 F (36.7 C) (03/26 1200) Temp Source: Oral (03/26 1200) BP: 115/88 (03/26 1500) Pulse Rate: 47 (03/26 1500)  Labs: Recent Labs    03/05/2021 0420 02/24/2021 1051 02/23/2021 2357 03/08/21 0632 03/08/21 1123 03/08/21 2312 03/09/21 0543 03/09/21 1051 03/09/21 1512  HGB 9.3*  --   --  9.4*  --   --  9.0*  --   --   HCT 31.2*  --   --  31.0*  --   --  29.9*  --   --   PLT 342  --   --  286  --   --  261  --   --   APTT  --   --    < >  --    < > 69* 65*  --  92*  LABPROT  --  20.0*  --   --   --   --   --   --   --   INR  --  1.8*  --   --   --   --   --   --   --   HEPARINUNFRC  --   --   --  1.40*  --   --  0.74*  --   --   CREATININE 5.85*  --   --  7.29*  --   --  6.00* 6.22*  --    < > = values in this interval not displayed.    Estimated Creatinine Clearance: 14.2 mL/min (A) (by C-G formula based on SCr of 6.22 mg/dL (H)).   Medications:  Apixaban 2.5 mg BID prior to admission  Assessment: 75 yo M presented with respiratory distress with hypotension and afib with RVR, HR 160. Pt receives HD MWF. PMH includes COPD, depression, diabetes, HLD, and HTN. Pt with symptomatic afib on 3/24 - cardioverted and started on heparin gtt. Heparin gtt was stopped 3/24 while pt was in cath lab. Pharmacy consulted to restart and monitor heparin.  Hgb: 9; plts: 261  Goal of Therapy:  aPTT goal 66-102 Heparin level 0.3-0.7 units/ml Monitor platelets by anticoagulation protocol: Yes    3/24 2357 aPTT 61, subtherapeutic 3/25 1123 aPTT 62, subtherapeutic 3/25 2312  aPTT 69, therapeutic 3/26 0543 aPTT 65, subtherapeutic, HL 0.74 3/26 1512 aPTT 92, therapeutic   Plan:  APTT therapeutic, continue heparin infusion at 2000 units/hr Recheck aPTT in 8 hours. Will monitor via aPTT levels until level correlates with heparin level since pt was on Eliquis prior to admission. Plan to restart Eliquis in approximately 24 hrs pending no additional procedures. Continue to monitor H&H and platelets  Darnelle Bos, PharmD 03/09/2021 4:53 PM

## 2021-03-09 NOTE — Consult Note (Signed)
Pharmacy Antibiotic Note  [name] is a 75 y.o. male admitted on 02/26/2021 with sepsis thought to be secondary to UTI. Pt presented with respiratory distress, weakness, and low BP during HD. Additional complaints included fatigue, mild sputum production, and decreased urinary output. PMH includes COPD, diabetes, HLD, and HTN. Pt found to be in afib with RVR. Concern for infection contributing to low blood pressure and new arrhythmia. Pt underwent pericardiocentesis for pericardial effusion with removal up 300 to 500 cc of bloody fluid. Pharmacy has been consulted for zosyn dosing. Patient has ESRD on HD MWF.   D5 total abx  Plan:  -Transitioning zosyn to ceftriaxone on 3/26 to complete 7 days of abx -Monitor cxs, abx deescalation / LOT   Height: 6' (182.9 cm) Weight: 117.8 kg (259 lb 11.2 oz) IBW/kg (Calculated) : 77.6  Temp (24hrs), Avg:98 F (36.7 C), Min:97.2 F (36.2 C), Max:98.5 F (36.9 C)  Recent Labs  Lab 02/22/2021 1325 03/06/21 0548 03/06/21 2101 03/06/21 2324 02/19/2021 0420 03/08/21 0632 03/09/21 0543  WBC 8.1 9.2 7.4  --  9.8 8.7 7.7  CREATININE 6.23* 7.12*  --   --  5.85* 7.29* 6.00*  LATICACIDVEN  --   --  2.2* 1.6  --   --   --     Estimated Creatinine Clearance: 14.7 mL/min (A) (by C-G formula based on SCr of 6 mg/dL (H)).    Allergies  Allergen Reactions  . Bupropion     Other reaction(s): Other (See Comments) Mood swings, angry Other reaction(s): Other (See Comments) Mood swings, angry    Antimicrobials this admission: 3/22 Vancomycin x 1 3/22 cefepime >> 3/23 3/23 Zosyn >> 3/26 3/26 ceftriaxone >>  Dose adjustments this admission: N/A  Microbiology results: 3/22 BCx: NGTD 3/22 UCx: NG 3/23 MRSA PCR: negative 3/24 pericardial fluid: NGTD  Thank you for allowing pharmacy to be a part of this patient's care.  Chinita Greenland PharmD Clinical Pharmacist 03/09/2021

## 2021-03-09 NOTE — Consult Note (Addendum)
[place] for heparin Indication: atrial fibrillation  Allergies  Allergen Reactions  . Bupropion     Other reaction(s): Other (See Comments) Mood swings, angry Other reaction(s): Other (See Comments) Mood swings, angry    Patient Measurements: Height: 6' (182.9 cm) Weight: 117.8 kg (259 lb 11.2 oz) IBW/kg (Calculated) : 77.6 Heparin Dosing Weight: 101.9  Vital Signs: Temp: 98.5 F (36.9 C) (03/26 0414) Temp Source: Oral (03/26 0414) BP: 104/74 (03/26 0630) Pulse Rate: 110 (03/26 0630)  Labs: Recent Labs    02/25/2021 0420 03/04/2021 1051 02/21/2021 2357 03/08/21 0632 03/08/21 1123 03/08/21 2312 03/09/21 0543  HGB 9.3*  --   --  9.4*  --   --  9.0*  HCT 31.2*  --   --  31.0*  --   --  29.9*  PLT 342  --   --  286  --   --  261  APTT  --   --    < >  --  62* 69* 65*  LABPROT  --  20.0*  --   --   --   --   --   INR  --  1.8*  --   --   --   --   --   HEPARINUNFRC  --   --   --  1.40*  --   --  0.74*  CREATININE 5.85*  --   --  7.29*  --   --  6.00*   < > = values in this interval not displayed.    Estimated Creatinine Clearance: 14.7 mL/min (A) (by C-G formula based on SCr of 6 mg/dL (H)).   Medications:  Apixaban 2.5 mg BID prior to admission  Assessment: 75 yo M presented with respiratory distress with hypotension and afib with RVR, HR 160. Pt receives HD MWF. PMH includes COPD, depression, diabetes, HLD, and HTN. Pt with symptomatic afib on 3/24 - cardioverted and started on heparin gtt. Heparin gtt was stopped 3/24 while pt was in cath lab. Pharmacy consulted to restart and monitor heparin.   Goal of Therapy:  aPTT goal 66-102 Heparin level 0.3-0.7 units/ml Monitor platelets by anticoagulation protocol: Yes    3/24 2357 aPTT 61, subtherapeutic 3/25 1123 aPTT 62, subtherapeutic 3/25 2312 aPTT 69, therapeutic 3/26 0543 aPTT 65, subtherapeutic, HL 0.74   Plan:  APTT slightly subtherapeutic.  Increase heparin drip to  2000 units/hr Recheck aPTT in 8 hours. Will monitor via aPTT levels until level correlates with heparin level since pt was on Eliquis prior to admission. Plan to restart Eliquis in approximately 24 hrs pending no additional procedures. Continue to monitor H&H and platelets  Renda Rolls, PharmD, [facility] 03/09/2021 6:36 AM

## 2021-03-10 ENCOUNTER — Encounter: Payer: Self-pay | Admitting: Internal Medicine

## 2021-03-10 ENCOUNTER — Inpatient Hospital Stay: Payer: Medicare Other

## 2021-03-10 DIAGNOSIS — J9811 Atelectasis: Secondary | ICD-10-CM

## 2021-03-10 DIAGNOSIS — J90 Pleural effusion, not elsewhere classified: Secondary | ICD-10-CM

## 2021-03-10 DIAGNOSIS — I484 Atypical atrial flutter: Secondary | ICD-10-CM | POA: Diagnosis not present

## 2021-03-10 DIAGNOSIS — J9602 Acute respiratory failure with hypercapnia: Secondary | ICD-10-CM | POA: Diagnosis not present

## 2021-03-10 DIAGNOSIS — J9601 Acute respiratory failure with hypoxia: Secondary | ICD-10-CM | POA: Diagnosis not present

## 2021-03-10 DIAGNOSIS — I313 Pericardial effusion (noninflammatory): Secondary | ICD-10-CM | POA: Diagnosis not present

## 2021-03-10 LAB — COMPREHENSIVE METABOLIC PANEL
ALT: 85 U/L — ABNORMAL HIGH (ref 0–44)
AST: 32 U/L (ref 15–41)
Albumin: 2.8 g/dL — ABNORMAL LOW (ref 3.5–5.0)
Alkaline Phosphatase: 77 U/L (ref 38–126)
Anion gap: 14 (ref 5–15)
BUN: 52 mg/dL — ABNORMAL HIGH (ref 8–23)
CO2: 23 mmol/L (ref 22–32)
Calcium: 8.1 mg/dL — ABNORMAL LOW (ref 8.9–10.3)
Chloride: 98 mmol/L (ref 98–111)
Creatinine, Ser: 7.24 mg/dL — ABNORMAL HIGH (ref 0.61–1.24)
GFR, Estimated: 7 mL/min — ABNORMAL LOW (ref >60)
Glucose, Bld: 149 mg/dL — ABNORMAL HIGH (ref 70–99)
Potassium: 3.9 mmol/L (ref 3.5–5.1)
Sodium: 135 mmol/L (ref 135–145)
Total Bilirubin: 0.8 mg/dL (ref 0.3–1.2)
Total Protein: 6.6 g/dL (ref 6.5–8.1)

## 2021-03-10 LAB — BLOOD GAS, ARTERIAL
Acid-base deficit: 3.6 mmol/L — ABNORMAL HIGH (ref 0.0–2.0)
Acid-base deficit: 4 mmol/L — ABNORMAL HIGH (ref 0.0–2.0)
Bicarbonate: 24.3 mmol/L (ref 20.0–28.0)
Bicarbonate: 23 mmol/L (ref 20.0–28.0)
FIO2: 32
FIO2: 50
MECHVT: 550 mL
Mechanical Rate: 16
O2 Saturation: 93.4 %
O2 Saturation: 96.3 %
PEEP: 5 cmH2O
Patient temperature: 37
Patient temperature: 37
RATE: 16 resp/min
pCO2 arterial: 58 mmHg — ABNORMAL HIGH (ref 32.0–48.0)
pCO2 arterial: 50 mmHg — ABNORMAL HIGH (ref 32.0–48.0)
pH, Arterial: 7.23 — ABNORMAL LOW (ref 7.350–7.450)
pH, Arterial: 7.27 — ABNORMAL LOW (ref 7.350–7.450)
pO2, Arterial: 81 mmHg — ABNORMAL LOW (ref 83.0–108.0)
pO2, Arterial: 95 mmHg (ref 83.0–108.0)

## 2021-03-10 LAB — CBC
HCT: 31.8 % — ABNORMAL LOW (ref 39.0–52.0)
Hemoglobin: 9.5 g/dL — ABNORMAL LOW (ref 13.0–17.0)
MCH: 28.7 pg (ref 26.0–34.0)
MCHC: 29.9 g/dL — ABNORMAL LOW (ref 30.0–36.0)
MCV: 96.1 fL (ref 80.0–100.0)
Platelets: 261 10*3/uL (ref 150–400)
RBC: 3.31 MIL/uL — ABNORMAL LOW (ref 4.22–5.81)
RDW: 15.9 % — ABNORMAL HIGH (ref 11.5–15.5)
WBC: 9 10*3/uL (ref 4.0–10.5)
nRBC: 0 % (ref 0.0–0.2)

## 2021-03-10 LAB — GLUCOSE, CAPILLARY
Glucose-Capillary: 154 mg/dL — ABNORMAL HIGH (ref 70–99)
Glucose-Capillary: 133 mg/dL — ABNORMAL HIGH (ref 70–99)
Glucose-Capillary: 113 mg/dL — ABNORMAL HIGH (ref 70–99)
Glucose-Capillary: 97 mg/dL (ref 70–99)
Glucose-Capillary: 105 mg/dL — ABNORMAL HIGH (ref 70–99)
Glucose-Capillary: 128 mg/dL — ABNORMAL HIGH (ref 70–99)

## 2021-03-10 LAB — URINALYSIS, COMPLETE (UACMP) WITH MICROSCOPIC
Bilirubin Urine: NEGATIVE
Glucose, UA: NEGATIVE mg/dL
Ketones, ur: NEGATIVE mg/dL
Nitrite: NEGATIVE
Protein, ur: 100 mg/dL — AB
Specific Gravity, Urine: 1.017 (ref 1.005–1.030)
Squamous Epithelial / HPF: NONE SEEN (ref 0–5)
pH: 5 (ref 5.0–8.0)

## 2021-03-10 LAB — BODY FLUID CELL COUNT WITH DIFFERENTIAL
Eos, Fluid: 0 %
Lymphs, Fluid: 60 %
Monocyte-Macrophage-Serous Fluid: 17 %
Neutrophil Count, Fluid: 23 %
Total Nucleated Cell Count, Fluid: 284 cu mm

## 2021-03-10 LAB — PROTEIN, PLEURAL OR PERITONEAL FLUID: Total protein, fluid: 3.1 g/dL

## 2021-03-10 LAB — PHOSPHORUS: Phosphorus: 7.4 mg/dL — ABNORMAL HIGH (ref 2.5–4.6)

## 2021-03-10 LAB — LACTATE DEHYDROGENASE, PLEURAL OR PERITONEAL FLUID: LD, Fluid: 165 U/L — ABNORMAL HIGH (ref 3–23)

## 2021-03-10 LAB — MAGNESIUM: Magnesium: 2.2 mg/dL (ref 1.7–2.4)

## 2021-03-10 LAB — STREP PNEUMONIAE URINARY ANTIGEN: Strep Pneumo Urinary Antigen: NEGATIVE

## 2021-03-10 LAB — CULTURE, BLOOD (ROUTINE X 2)
Culture: NO GROWTH
Culture: NO GROWTH
Special Requests: ADEQUATE
Special Requests: ADEQUATE

## 2021-03-10 LAB — GLUCOSE, PLEURAL OR PERITONEAL FLUID: Glucose, Fluid: 113 mg/dL

## 2021-03-10 LAB — SEDIMENTATION RATE: Sed Rate: 60 mm/hr — ABNORMAL HIGH (ref 0–20)

## 2021-03-10 LAB — ALBUMIN, PLEURAL OR PERITONEAL FLUID: Albumin, Fluid: 1.7 g/dL

## 2021-03-10 LAB — HEPARIN LEVEL (UNFRACTIONATED)
Heparin Unfractionated: 0.62 IU/mL (ref 0.30–0.70)
Heparin Unfractionated: 0.64 IU/mL (ref 0.30–0.70)

## 2021-03-10 MED ORDER — CHLORHEXIDINE GLUCONATE 0.12% ORAL RINSE (MEDLINE KIT)
15.0000 mL | Freq: Two times a day (BID) | OROMUCOSAL | Status: DC
  Administered 2021-03-10 (×2): 15 mL via OROMUCOSAL

## 2021-03-10 MED ORDER — PROPOFOL 1000 MG/100ML IV EMUL
INTRAVENOUS | Status: AC
  Administered 2021-03-10: 10 ug/kg/min via INTRAVENOUS
  Filled 2021-03-10: qty 100

## 2021-03-10 MED ORDER — VECURONIUM BROMIDE 10 MG IV SOLR: 10.0000 mg | Freq: Once | INTRAVENOUS | Status: AC

## 2021-03-10 MED ORDER — ROPINIROLE HCL 1 MG PO TABS
4.0000 mg | ORAL_TABLET | Freq: Two times a day (BID) | ORAL | Status: DC
  Administered 2021-03-11 – 2021-03-12 (×3): 4 mg
  Filled 2021-03-10 (×5): qty 4

## 2021-03-10 MED ORDER — MIDODRINE HCL 5 MG PO TABS: 10.0000 mg | ORAL_TABLET | ORAL | Status: DC

## 2021-03-10 MED ORDER — PROPOFOL 1000 MG/100ML IV EMUL
5.0000 ug/kg/min | INTRAVENOUS | Status: DC
  Administered 2021-03-11 – 2021-03-12 (×6): 20 ug/kg/min via INTRAVENOUS
  Filled 2021-03-10 (×6): qty 100

## 2021-03-10 MED ORDER — DEXMEDETOMIDINE HCL IN NACL 400 MCG/100ML IV SOLN
0.4000 ug/kg/h | INTRAVENOUS | Status: DC
  Administered 2021-03-10: 0.4 ug/kg/h via INTRAVENOUS
  Filled 2021-03-10: qty 100

## 2021-03-10 MED ORDER — MIRTAZAPINE 15 MG PO TABS
45.0000 mg | ORAL_TABLET | Freq: Every day | ORAL | Status: DC
  Administered 2021-03-10 – 2021-03-11 (×2): 45 mg
  Filled 2021-03-10 (×2): qty 3

## 2021-03-10 MED ORDER — ETOMIDATE 2 MG/ML IV SOLN
INTRAVENOUS | Status: AC
  Administered 2021-03-10: 20 mg
  Filled 2021-03-10: qty 10

## 2021-03-10 MED ORDER — MIDAZOLAM HCL 2 MG/2ML IJ SOLN
2.0000 mg | Freq: Once | INTRAMUSCULAR | Status: AC
  Administered 2021-03-10: 2 mg via INTRAVENOUS
  Filled 2021-03-10: qty 2

## 2021-03-10 MED ORDER — ROCURONIUM BROMIDE 50 MG/5ML IV SOLN
50.0000 mg | Freq: Once | INTRAVENOUS | Status: AC
  Administered 2021-03-10: 50 mg via INTRAVENOUS
  Filled 2021-03-10: qty 1

## 2021-03-10 MED ORDER — VASOPRESSIN 20 UNITS/100 ML INFUSION FOR SHOCK
0.0000 [IU]/min | INTRAVENOUS | Status: DC
  Administered 2021-03-10 – 2021-03-12 (×5): 0.03 [IU]/min via INTRAVENOUS
  Filled 2021-03-10 (×5): qty 100

## 2021-03-10 MED ORDER — ETOMIDATE 2 MG/ML IV SOLN: 20.0000 mg | Freq: Once | INTRAVENOUS | Status: DC

## 2021-03-10 MED ORDER — ORAL CARE MOUTH RINSE
15.0000 mL | OROMUCOSAL | Status: DC
  Administered 2021-03-10 (×5): 15 mL via OROMUCOSAL

## 2021-03-10 MED ORDER — SUCRALFATE 1 G PO TABS
1.0000 g | ORAL_TABLET | Freq: Three times a day (TID) | ORAL | Status: DC
  Administered 2021-03-10 – 2021-03-12 (×5): 1 g
  Filled 2021-03-10 (×5): qty 1

## 2021-03-10 MED ORDER — SEVELAMER CARBONATE 800 MG PO TABS
1600.0000 mg | ORAL_TABLET | Freq: Three times a day (TID) | ORAL | Status: DC
  Administered 2021-03-11: 1600 mg
  Filled 2021-03-10: qty 2

## 2021-03-10 MED ORDER — ORAL CARE MOUTH RINSE
15.0000 mL | OROMUCOSAL | Status: DC
  Administered 2021-03-10 – 2021-03-12 (×17): 15 mL via OROMUCOSAL

## 2021-03-10 MED ORDER — FENTANYL 2500MCG IN NS 250ML (10MCG/ML) PREMIX INFUSION
0.0000 ug/h | INTRAVENOUS | Status: DC
  Administered 2021-03-10 (×2): 200 ug/h via INTRAVENOUS
  Administered 2021-03-11 – 2021-03-12 (×4): 250 ug/h via INTRAVENOUS
  Filled 2021-03-10 (×6): qty 250

## 2021-03-10 MED ORDER — LACTATED RINGERS IV BOLUS
1000.0000 mL | Freq: Once | INTRAVENOUS | Status: AC
  Administered 2021-03-10: 1000 mL via INTRAVENOUS

## 2021-03-10 MED ORDER — CLONAZEPAM 0.25 MG PO TBDP: 0.2500 mg | ORAL_TABLET | Freq: Two times a day (BID) | ORAL | Status: DC | PRN

## 2021-03-10 MED ORDER — DEXTROSE 50 % IV SOLN: 12.5000 g | INTRAVENOUS | Status: DC

## 2021-03-10 MED ORDER — PROPOFOL 1000 MG/100ML IV EMUL
5.0000 ug/kg/min | INTRAVENOUS | Status: DC
  Administered 2021-03-10: 15 ug/kg/min via INTRAVENOUS
  Filled 2021-03-10: qty 100

## 2021-03-10 MED ORDER — FENTANYL CITRATE (PF) 100 MCG/2ML IJ SOLN
100.0000 ug | Freq: Once | INTRAMUSCULAR | Status: AC
Start: 2021-03-10 — End: 2021-03-10
  Administered 2021-03-10: 100 ug via INTRAVENOUS
  Filled 2021-03-10: qty 2

## 2021-03-10 MED ORDER — EZETIMIBE 10 MG PO TABS
10.0000 mg | ORAL_TABLET | Freq: Every day | ORAL | Status: DC
  Administered 2021-03-11: 10 mg
  Filled 2021-03-10 (×2): qty 1

## 2021-03-10 MED ORDER — ROSUVASTATIN CALCIUM 10 MG PO TABS
10.0000 mg | ORAL_TABLET | Freq: Every day | ORAL | Status: DC
  Administered 2021-03-10 – 2021-03-11 (×2): 10 mg
  Filled 2021-03-10 (×2): qty 1

## 2021-03-10 MED ORDER — VECURONIUM BROMIDE 10 MG IV SOLR
INTRAVENOUS | Status: AC
  Administered 2021-03-10: 10 mg via INTRAVENOUS
  Filled 2021-03-10: qty 10

## 2021-03-10 MED ORDER — POLYETHYLENE GLYCOL 3350 17 G PO PACK
17.0000 g | PACK | Freq: Every day | ORAL | Status: DC
  Filled 2021-03-10: qty 1

## 2021-03-10 MED ORDER — ACETAMINOPHEN 325 MG PO TABS: 650.0000 mg | ORAL_TABLET | Freq: Four times a day (QID) | ORAL | Status: DC | PRN

## 2021-03-10 MED ORDER — ALPRAZOLAM 0.5 MG PO TABS: 0.5000 mg | ORAL_TABLET | Freq: Every evening | ORAL | Status: DC | PRN

## 2021-03-10 MED ORDER — CHLORHEXIDINE GLUCONATE 0.12% ORAL RINSE (MEDLINE KIT)
15.0000 mL | Freq: Two times a day (BID) | OROMUCOSAL | Status: DC
  Administered 2021-03-11 – 2021-03-12 (×3): 15 mL via OROMUCOSAL

## 2021-03-10 MED ORDER — DOCUSATE SODIUM 50 MG/5ML PO LIQD
100.0000 mg | Freq: Two times a day (BID) | ORAL | Status: DC
  Administered 2021-03-10 – 2021-03-11 (×2): 100 mg
  Filled 2021-03-10 (×3): qty 10

## 2021-03-10 NOTE — Procedures (Signed)
Chest Tube Insertion: Indication: Very large left pleural effusion with complete left lung atelectasis Consent: Per patient's wife, patient unable to provide consent, intubated and mechanically ventilated.  Risks and benefits explained in detail including risk of infection, bleeding, respiratory failure and death..   Hand washing performed prior to starting the procedure.   Type of Anesthesia: 1 % Lidocaine.  Patient is intubated and mechanically ventilated under sedative infusions of propofol and fentanyl.  Procedure: An active timeout was performed and correct patient, name, & ID confirmed.  Patient positioned correctly for chest tube placement.  Ultrasound was utilized to assess the optimal place for chest tube.  The patient was prepped in a sterile fashion with chlorhexidine preps, aid wide coverage sterile drape was then applied.operator wore sterile gown and sterile gloves and mask.  Patient had the area anesthetized with local anesthetic.  Introducer needle was introduced and fluid was readily obtained, then a guide wire was inserted. Using Seldinger Technique, the introducer needle was removed and three dilators were used to create an opening for insertion of chest tube.  A #24 Thal-Quick chest tube was then inserted with guidewire guidance.  Sure and guidewire were then removed and chest tube was placed to suction.  The chest tube was then sutured in place and proper dressing applied.  A total of 1400 mL+ of pink to straw-colored fluid was removed.  Findings: Brisk return of pleural fluid upon insetion of needle, Chest tube placed bewteen 5-7 ribs midaxiilary line.   Chest tube connected to: pleurovac .   Number of Attempts:1  Complications: None  Estimated Blood Loss: Less than 2 mL  Chest radiograph shows chest tube in good position with almost complete resolution of the pleural effusion.  Good reexpansion of the left lung.   Operator: Windy Canny. Derrill Kay, MD [place]  [facility]   *This note was dictated using voice recognition software/Dragon.  Despite best efforts to proofread, errors can occur which can change the meaning.  Any change was purely unintentional.

## 2021-03-10 NOTE — Consult Note (Signed)
[place] for heparin Indication: atrial fibrillation  Allergies  Allergen Reactions  . Bupropion     Other reaction(s): Other (See Comments) Mood swings, angry Other reaction(s): Other (See Comments) Mood swings, angry    Patient Measurements: Height: 6' (182.9 cm) Weight: 117.8 kg (259 lb 11.2 oz) IBW/kg (Calculated) : 77.6 Heparin Dosing Weight: 101.9  Vital Signs: Temp: 97.8 F (36.6 C) (03/26 2000) Temp Source: Oral (03/26 2000) BP: 105/68 (03/27 0000) Pulse Rate: 67 (03/27 0000)  Labs: Recent Labs    03/02/2021 0420 03/02/2021 1051 02/25/2021 2357 03/08/21 0632 03/08/21 1123 03/08/21 2312 03/09/21 0543 03/09/21 1051 03/09/21 1512 03/09/21 2309  HGB 9.3*  --   --  9.4*  --   --  9.0*  --   --   --   HCT 31.2*  --   --  31.0*  --   --  29.9*  --   --   --   PLT 342  --   --  286  --   --  261  --   --   --   APTT  --   --    < >  --    < > 69* 65*  --  92*  --   LABPROT  --  20.0*  --   --   --   --   --   --   --   --   INR  --  1.8*  --   --   --   --   --   --   --   --   HEPARINUNFRC  --   --   --  1.40*  --   --  0.74*  --   --  0.62  CREATININE 5.85*  --   --  7.29*  --   --  6.00* 6.22*  --   --    < > = values in this interval not displayed.    Estimated Creatinine Clearance: 14.2 mL/min (A) (by C-G formula based on SCr of 6.22 mg/dL (H)).   Medications:  Apixaban 2.5 mg BID prior to admission  Assessment: 75 yo M presented with respiratory distress with hypotension and afib with RVR, HR 160. Pt receives HD MWF. PMH includes COPD, depression, diabetes, HLD, and HTN. Pt with symptomatic afib on 3/24 - cardioverted and started on heparin gtt. Heparin gtt was stopped 3/24 while pt was in cath lab. Pharmacy consulted to restart and monitor heparin.  Hgb: 9; plts: 261  Goal of Therapy:  aPTT goal 66-102 Heparin level 0.3-0.7 units/ml Monitor platelets by anticoagulation protocol: Yes    3/24 2357 aPTT 61,  subtherapeutic 3/25 1123 aPTT 62, subtherapeutic 3/25 2312 aPTT 69, therapeutic 3/26 0543 aPTT 65, subtherapeutic, HL 0.74 3/26 1512 aPTT 92, therapeutic 3/26 2309 HL 0.62, therapeutic   Plan:  HL therapeutic, continue heparin infusion at 2000 units/hr Recheck HL with AM labs to confirm. Plan to restart Eliquis in approximately 24 hrs pending no additional procedures. Continue to monitor H&H and platelets  Renda Rolls, PharmD, [facility] 03/10/2021 12:41 AM

## 2021-03-10 NOTE — Procedures (Signed)
  PROCEDURE: BRONCHOSCOPY Therapeutic Aspiration of Tracheobronchial Tree  PROCEDURE DATE: 03/10/2021  TIME:  NAME:  Xiomar Crompton  DOB:Mar 25, 1946  MRN: [id number] LOC:  IC04A/IC04A-AA    HOSP DAY: _0 @    Indications/Preliminary Diagnosis:   Complete atelectasis of the left lung  Consent: (Place X beside choice/s below)  The benefits, risks and possible complications of the procedure were        explained to:  ___ patient  ___ patient's family  ___ other:___________  who verbalized understanding and gave:  ___ verbal  ___ written  ___ verbal and written  ___ telephone  ___ other:________ consent.    X  Unable to obtain consent; procedure performed on emergent basis.     Other:       Patient currently intubated and mechanically ventilated on IV sedation: Propofol and fentanyl infusions.  Received vecuronium 10 mg IV x1.    PROCEDURE DETAILS: Timeout performed and correct patient, name, & ID confirmed.  Portex adapter was placed on the existing ET tube flange.  The Olympus therapeutic video bronchoscope was inserted through the existing ET tube via Portex adapter.  Visible trachea was normal, carina was sharp.  Examination of the right lung showed no obstructing lesions.  There was a large mucous plug occluding the left mainstem bronchus.  Therapeutic aspiration of left tracheobronchial tree was performed.  A large mucous plug was removed.  The airway was lavaged till clear.  Bronchoalveolar lavage was obtained from the left lung and sent for culture and sensitivity.  Airway exam proceeded with findings, technical procedures, and specimen collection as noted below. At the end of exam the scope was withdrawn without incident. Impression and Plan as noted below.     Insertion Route (Place X beside choice below)   Nasal   Oral  X Endotracheal Tube   Tracheostomy   TECHNICAL PROCEDURES: (Place X beside choice below)   Procedures  Description    None      Electrocautery     Cryotherapy     Balloon Dilatation     Bronchography     Stent Placement   X  Therapeutic Aspiration     Laser/Argon Plasma    Brachytherapy Catheter Placement    Foreign Body Removal      SPECIMENS (Sites): (Place X beside choice below)  Specimens Description   No Specimens Obtained     Washings   X Lavage    Biopsies    Fine Needle Aspirates    Brushings    Sputum    FINDINGS: Large mucous plug occluding the left mainstem bronchus. ESTIMATED BLOOD LOSS: none COMPLICATIONS/RESOLUTION: none  IMPRESSION:POST-PROCEDURE DX:   Large mucous plug occluding left mainstem bronchus  Complete atelectasis of the left lung   RECOMMENDATION/PLAN:   Proceed with CT chest  Send BAL for cultures  Continue supportive care   C. Derrill Kay, MD [place] [facility]   *This note was dictated using voice recognition software/Dragon.  Despite best efforts to proofread, errors can occur which can change the meaning.  Any change was purely unintentional.

## 2021-03-10 NOTE — Procedures (Signed)
Endotracheal Intubation: Patient required placement of an artificial airway secondary to Respiratory Failure  Consent: Emergent.  Unable to reach family at present.  Patient encephalopathic.  Chest x-ray shows complete whiteout of the left lung.  ABG    Component Value Date/Time   PHART 7.23 (L) 03/10/2021 0658   PCO2ART 58 (H) 03/10/2021 0658   PO2ART 81 (L) 03/10/2021 0658   HCO3 24.3 03/10/2021 0658   ACIDBASEDEF 3.6 (H) 03/10/2021 0658   O2SAT 93.4 03/10/2021 0658     Hand washing performed prior to starting the procedure.   Medications administered for sedation prior to procedure:  Midazolam 2 mg IV,  Rocuronium 50 mg IV, Etomidate 20 mg IV, Fentanyl 100 mcg IV.    A time out procedure was called and correct patient, name, & ID confirmed. Needed supplies and equipment were assembled and checked to include ETT, 10 ml syringe, Glidescope, Mac and Miller blades, suction, oxygen and bag mask valve, end tidal CO2 monitor.   Patient was positioned to align the mouth and pharynx to facilitate visualization of the glottis.   Heart rate, SpO2 and blood pressure was continuously monitored during the procedure. Pre-oxygenation was conducted prior to intubation.  Direct visualization was obtained via glidescope blade #S4.  Airway view was class I.  ET tube was introduced via the oral route using a 8.5 ETT on the first attempt.  ETT was secured at 24 cm mark @ teeth.  Placement was confirmed by auscuitation of lungs with good breath sounds bilaterally and no pregastric sounds.  Condensation was noted on endotracheal tube.   Pulse ox 98%.  CO2 detector in place with appropriate color change.   Complications: None .   Operator: Patsey Berthold  Chest radiograph ordered: Shows ET tube approximately 3 cm above the carina.  Persistent left lung atelectasis.  Patient tolerated procedure well.  Renold Don, MD [place] [facility]   *This note was dictated using voice recognition  software/Dragon.  Despite best efforts to proofread, errors can occur which can change the meaning.  Any change was purely unintentional.

## 2021-03-10 NOTE — Progress Notes (Signed)
PT Cancellation Note  Patient Details Name: [name] MRN: [id number] DOB: 07/10/1946   Cancelled Treatment:    Reason Eval/Treat Not Completed: Medical issues which prohibited therapy Pt is now on vent support.  Spoke with nursing, we will complete PT orders and he will need new consult at such time that he is appropriate.  Will sign off at this time.    Kreg Shropshire, DPT 03/10/2021, 2:16 PM

## 2021-03-10 NOTE — Progress Notes (Signed)
[facility] Cardiology    SUBJECTIVE: Patient intubated sedated secondary to mucous plugging and pleural effusion   Vitals:   03/10/21 0500 03/10/21 0600 03/10/21 0840 03/10/21 0845  BP: 113/74  116/87 120/85  Pulse: 83 70 (!) 129 (!) 130  Resp: 19 19    Temp:      TempSrc:      SpO2: 94% 97% 100% 100%  Weight: 123.1 kg     Height:         Intake/Output Summary (Last 24 hours) at 03/10/2021 0949 Last data filed at 03/10/2021 0600 Gross per 24 hour  Intake 1075.06 ml  Output --  Net 1075.06 ml      PHYSICAL EXAM  General: Well developed, well nourished, in no acute distress HEENT:  Normocephalic and atramatic Neck:  No JVD.  Lungs: Clear bilaterally to auscultation and percussion. Heart: HRRR . Normal S1 and S2 without gallops or murmurs.  Abdomen: Bowel sounds are positive, abdomen soft and non-tender  Msk:  Back normal, normal gait. Normal strength and tone for age. Extremities: No clubbing, cyanosis or edema.   Neuro: Alert and oriented X 3. Psych:  Good affect, responds appropriately   LABS: Basic Metabolic Panel: Recent Labs    03/09/21 0543 03/09/21 1051 03/10/21 0443  NA 137 136 135  K 3.6 3.5 3.9  CL 99 98 98  CO2 26 26 23   GLUCOSE 138* 154* 149*  BUN 47* 49* 52*  CREATININE 6.00* 6.22* 7.24*  CALCIUM 7.1* 7.3* 8.1*  MG 2.0  --  2.2  PHOS 5.5*  --  7.4*   Liver Function Tests: Recent Labs    03/09/21 1051 03/10/21 0443  AST 49* 32  ALT 103* 85*  ALKPHOS 77 77  BILITOT 0.7 0.8  PROT 6.3* 6.6  ALBUMIN 2.8* 2.8*   No results for input(s): LIPASE, AMYLASE in the last 72 hours. CBC: Recent Labs    03/09/21 0543 03/10/21 0443  WBC 7.7 9.0  HGB 9.0* 9.5*  HCT 29.9* 31.8*  MCV 97.1 96.1  PLT 261 261   Cardiac Enzymes: No results for input(s): CKTOTAL, CKMB, CKMBINDEX, TROPONINI in the last 72 hours. BNP: Invalid input(s): POCBNP D-Dimer: No results for input(s): DDIMER in the last 72 hours. Hemoglobin A1C: No results for input(s):  HGBA1C in the last 72 hours. Fasting Lipid Panel: No results for input(s): CHOL, HDL, LDLCALC, TRIG, CHOLHDL, LDLDIRECT in the last 72 hours. Thyroid Function Tests: No results for input(s): TSH, T4TOTAL, T3FREE, THYROIDAB in the last 72 hours.  Invalid input(s): FREET3 Anemia Panel: No results for input(s): VITAMINB12, FOLATE, FERRITIN, TIBC, IRON, RETICCTPCT in the last 72 hours.  DG Chest Port 1 View  Result Date: 03/10/2021 CLINICAL DATA:  Endotracheal and esophagogastric tube placement EXAM: PORTABLE CHEST 1 VIEW COMPARISON:  03/10/2021, 6:12 a.m. FINDINGS: Interval placement of endotracheal tube, tip projecting over the mid trachea. Interval placement of esophagogastric gastric tube, tip and side port below the diaphragm. Otherwise unchanged examination with cardiomegaly and total opacification of the left hemithorax. Diffuse interstitial opacity and probable small layering pleural effusion on the right. Large-bore right neck multi lumen vascular catheter. IMPRESSION: 1. Interval placement of endotracheal tube, tip projecting over the mid trachea. 2. Interval placement of esophagogastric gastric tube, tip and side port below the diaphragm. 3. Otherwise unchanged examination with cardiomegaly and total opacification of the left hemithorax, as well as edema and probable small pleural effusion on the right. Electronically Signed   By: Dorna Bloom.D.  On: 03/10/2021 09:12   DG Chest Port 1 View  Result Date: 03/10/2021 CLINICAL DATA:  Shortness of breath, assess location of HD catheter EXAM: PORTABLE CHEST 1 VIEW COMPARISON:  03/08/2021 FINDINGS: Near complete opacification of the left hemithorax, with mild lucency/aeration in the central left upper lobe. This likely reflects mucous plugging given rapid change from prior, although a large left pleural effusion could also have this appearance. Right lung is essentially clear, noting a suspected trace pleural effusion. No pneumothorax.  Cardiomegaly. Right chest dual lumen dialysis catheter is appropriately positioned in the cavoatrial junction. IMPRESSION: Near complete opacification of the left hemithorax, likely reflecting mucous plugging given rapid change from prior, although a large left pleural effusion could also have this appearance. Right chest dual lumen dialysis catheter positioned in the cavoatrial junction. Electronically Signed   By: Julian Hy M.D.   On: 03/10/2021 06:27   DG Chest Port 1 View  Result Date: 03/08/2021 CLINICAL DATA:  Shortness of breath. EXAM: PORTABLE CHEST 1 VIEW COMPARISON:  Radiograph March 07, 2021 FINDINGS: Right IJ central venous catheter tip projecting over the superior cavoatrial junction. Similar generalized cardiac enlargement. Pulmonary vascular congestion. Small left greater than right pleural effusions. Left basilar atelectasis. No focal consolidation or overt pulmonary edema. The visualized skeletal structures are unchanged. IMPRESSION: No significant interval change in the enlarged cardiac silhouette and small pleural effusion. Similar bibasilar atelectasis. Electronically Signed   By: Dahlia Bailiff MD   On: 03/08/2021 21:08     Echo informal bedside echo suggested preserved LV function varies small to moderate pleural effusion no evidence of tamponade  TELEMETRY: Atrial fibrillation rate of around 90 nonspecific T wave changes:  ASSESSMENT AND PLAN:  Active Problems:   UTI (urinary tract infection) Congestive heart failure End-stage renal disease on dialysis Pericardial effusion Hypotension Atrial fibrillation rapid ventricular response Obesity Respiratory failure Large left pleural effusion Reaccumulating mild to moderate pericardial effusion Mucous plugging  Plan Continue ventilatory support Agree with thoracentesis left-sided pleural effusion Do not recommend pericardial effusion at this stage fluid is too small and no evidence of tamponade Continue dialysis  management as per nephrology Continue rate control for atrial fibrillation Broad-spectrum antibiotic therapy as necessary for possible sepsis Continue supportive care   Yolonda Kida, MD 03/10/2021 9:49 AM

## 2021-03-10 NOTE — Progress Notes (Addendum)
NAME:  [name], MRN:  [id number], DOB:  [birth year], LOS: 5 ADMISSION DATE:  02/16/2021, CONSULTATION DATE: 03/06/2021 REFERRING MD: Sharion Settler, NP, CHIEF COMPLAINT: Hypotension    History of Present Illness:  This is a 75 yo male who presented to [facility] ER via EMS on 03/22 from home via EMS with c/o shortness of breath, fatigue, and weakness onset several weeks priors to presentation.  He has a hx of ESRD requiring HD M-W-F, last HD session 03/21, however unable to complete the full session due to hypotension. At baseline he voids some although in the last few days he has been unable to void. He reports he has had weakness and shortness of breath after he had a clogged dialysis catheter that required catheter lysis on 03/9.  He subsequently presented to the ER on 03/10 with chest pain.  CTA Chest performed which was negative for PE although dilated pulmonary arteries were noted along with a small pericardial effusion, and 3-vessel coronary atherosclerosis.  During that presentation bp marginal spb 90's.  ER physician discussed the case with on call Nephrologist Dr. Harvest Dark who advised he would monitor the pt closely during his scheduled HD session the next day, and pt did not require hospital admission.  ED course Upon arrival to the ER EKG revealed atrial flutter vs. atrial fibrillation with rvr.  Pt also hypotensive and diaphoretic.  He subsequently received low dose etomidate and underwent synchronized cardioversion which was successful cardiac rhythm sinus rhythm.  Pts hypotension improved post cardioversion.  CXR concerning for pericardial effusion, left pleural effusion, and left lower lobe airspace disease. UA concerning for UTI. Abx therapy initiated. ER physician consulted cardiology and nephrology. Hospitalist team admitted pt to the medsurg unit for additional workup and treatment.   Hospital course On 03/23 pt required transfer to the stepdown unit with wide complex  tachycardia hr. 140-150's and hypotension sbp 60-70's.  He required amiodarone bolus followed by continuous gtt and peripheral levophed.  PCCM team consulted to assist with management.    Pertinent  Medical History  OSA Renal Cell Carcinoma HTN HLD Type II Diabetes Mellitus Depression COPD Chronic Back Pain ESRD HD M-W-F  Significant Hospital Events: Including procedures, antibiotic start and stop dates in addition to other pertinent events   03/22: Pt presented to [facility] ER with weakness and shortness of breath.  Found to be hypotensive and cardiac rhythm atrial fibrillation with rvr.  Underwent successful cardioversion with improvement in bp 03/23: Pt admitted to the medsurg unit per hospitalist team for additional workup and treatment 03/23: Pt required transfer to the stepdown unit with wide complex tachycardia and hypotension requiring levophed and amiodarone gtts. PCCM team consulted to assist with management 03/27: Has complete atelectasis of the left lung, required intubation due to respiratory failure with hypercapnia.  Persistent A. fib flutter, CT chest ordered  Interim History / Subjective:  Confused, requiring Precedex infusion, uncooperative.  Objective   Blood pressure 120/85, pulse (!) 130, temperature 97.6 F (36.4 C), temperature source Axillary, resp. rate 19, height 6' (1.829 m), weight 123.1 kg, SpO2 100 %.        Intake/Output Summary (Last 24 hours) at 03/10/2021 0914 Last data filed at 03/10/2021 0600 Gross per 24 hour  Intake 1075.06 ml  Output --  Net 1075.06 ml   Filed Weights   02/15/2021 1030 03/09/21 0500 03/10/21 0500  Weight: 115.9 kg 117.8 kg 123.1 kg   Examination: General: acutely ill appearing male, resting in bed confused, no acute  respiratory distress HEENT: supple, no JVD, trachea midline Lungs: diminished to absent on left, rhonchi on right, non labored  Cardiovascular: Atrial fib flutter with rapid ventricular response, no R/G, 2+  radial/1+distal pulses, trace bilateral lower extremity edema  Abdomen: +BS x4, obese, soft, non distended, non tender  Extremities: left leg bulk larger which is his baseline, moves all extremities  Neuro: Confused/encephalopathic  GU: currently anuric   Labs/imaging that I havepersonally reviewed   03/23: BUN 72, creatinine 7.12, calcium 6.7, anion gap 16, phosphorous 9.0, albumin 3.1, lactic acid 2.2, pct 1.05, BNP 173.2, hgb 9.4, TSH 3.513, free T4 0.81, respiratory panel by rt-pcr negative, and UA concerning for UTI   CXR as below there is complete atelectasis of the left lung:     Resolved Hospital Problem list   N/A  Assessment & Plan:   Acute on chronic respiratory failure secondary to vascular congestion in the setting of ESRD Complete left lung atelectasis  Hx: COPD and OSA Intubation Mechanical ventilation with support Bronchoscopy to evaluate for potential mucous plugging Aggressive pulmonary hygiene Bronchodilator therapy HD per nephrology recommendations CT chest   Cardiovascular: Wide complex tachycardia  Atrial fibrillation with rvr s/p synchronized cardioversion 03/22 A. fib flutter recurred Echo 03/23~moderate pericardial effusion no evidence of cardiac tamponade Status post pericardiocentesis Mildly elevated troponin likely demand ischemia  Hypotension likely cardiogenic shock  Hx: HTN and HLD Rate control with amiodarone Pressors converted to neo/vaso Pericardiocentesis performed CT chest should help determine whether fluid has re accumulated 2D echo, repeat to check on pericardial effusion Persistent hypotension, pressors continued Likely due to loss of "atrial kick"  ESRD on HD M-W-F  Hx: Renal cell carcinoma  Trend BMP  Replace electrolytes as indicated  Avoid nephrotoxic medications  Nephrology consulted appreciate input Mixed reports with regards to patient's urine output Check for urinary retention, Foley if needed  UTI Trend WBC and  monitor fever curve Trend PCT  Follow cultures  Continue zosyn for now   Type II diabetes mellitus  Monitor serum glucose If glucose >150 will start SSI  Depression  Continue remeron and cymbalta    Best practice (evaluated daily)  Diet:  Oral Pain/Anxiety/Delirium protocol (if indicated): No VAP protocol (if indicated): Not indicated DVT prophylaxis: Systemic AC GI prophylaxis: PPI Glucose control:  SSI No Central venous access:  N/A Arterial line:  N/A Foley:  N/A Mobility:  bed rest  PT consulted: N/A Last date of multidisciplinary goals of care discussion: Attempting to reach family to discuss Code Status:  full code Disposition: ICU  Labs   CBC: Recent Labs  Lab 02/19/2021 1325 03/06/21 0548 03/06/21 2101 03/11/2021 0420 03/08/21 0632 03/09/21 0543 03/10/21 0443  WBC 8.1 9.2 7.4 9.8 8.7 7.7 9.0  NEUTROABS 6.7 7.4 5.9 8.6*  --   --   --   HGB 9.3* 9.3* 9.4* 9.3* 9.4* 9.0* 9.5*  HCT 30.2* 30.8* 31.2* 31.2* 31.0* 29.9* 31.8*  MCV 95.3 97.5 97.5 98.1 96.9 97.1 96.1  PLT 300 295 279 342 286 261 413    Basic Metabolic Panel: Recent Labs  Lab 03/06/21 0548 03/14/2021 0420 03/08/21 0632 03/09/21 0543 03/09/21 1051 03/10/21 0443  NA 138 136 136 137 136 135  K 4.0 4.0 3.6 3.6 3.5 3.9  CL 97* 96* 96* 99 98 98  CO2 _0 GLUCOSE 111* 265* 158* 138* 154* 149*  BUN 72* 54* 69* 47* 49* 52*  CREATININE 7.12* 5.85* 7.29* 6.00* 6.22* 7.24*  CALCIUM  6.7* 7.0* 6.6* 7.1* 7.3* 8.1*  MG 1.8 1.8 2.1 2.0  --  2.2  PHOS 9.0* 7.3* 8.7* 5.5*  --  7.4*   GFR: Estimated Creatinine Clearance: 12.5 mL/min (A) (by C-G formula based on SCr of 7.24 mg/dL (H)). Recent Labs  Lab 03/06/21 2101 03/06/21 2324 02/28/2021 0420 03/08/21 0632 03/09/21 0543 03/10/21 0443  PROCALCITON 1.05  --  1.01 1.56  --   --   WBC 7.4  --  9.8 8.7 7.7 9.0  LATICACIDVEN 2.2* 1.6  --   --   --   --     Liver Function Tests: Recent Labs  Lab 03/04/2021 1325 03/06/21 0548  03/02/2021 0420 03/09/21 1051 03/10/21 0443  AST 142* 74* 104* 49* 32  ALT 110* 98* 128* 103* 85*  ALKPHOS 88 83 91 77 77  BILITOT 0.9 0.9 0.9 0.7 0.8  PROT 6.4* 6.6 6.6 6.3* 6.6  ALBUMIN 3.0* 3.1* 3.3* 2.8* 2.8*   No results for input(s): LIPASE, AMYLASE in the last 168 hours. No results for input(s): AMMONIA in the last 168 hours.  ABG    Component Value Date/Time   PHART 7.23 (L) 03/10/2021 0658   PCO2ART 58 (H) 03/10/2021 0658   PO2ART 81 (L) 03/10/2021 0658   HCO3 24.3 03/10/2021 0658   ACIDBASEDEF 3.6 (H) 03/10/2021 0658   O2SAT 93.4 03/10/2021 0658     Coagulation Profile: Recent Labs  Lab 03/02/2021 1051  INR 1.8*    Cardiac Enzymes: No results for input(s): CKTOTAL, CKMB, CKMBINDEX, TROPONINI in the last 168 hours.  HbA1C: Hemoglobin A1C  Date/Time Value Ref Range Status  01/27/2014 05:31 AM 5.5 4.2 - 6.3 % Final    Comment:    The American Diabetes Association recommends that a primary goal of therapy should be <7% and that physicians should reevaluate the treatment regimen in patients with HbA1c values consistently >8%.    Hgb A1c MFr Bld  Date/Time Value Ref Range Status  03/06/2021 05:48 AM 5.7 (H) 4.8 - 5.6 % Final    Comment:    (NOTE) Pre diabetes:          5.7%-6.4%  Diabetes:              >6.4%  Glycemic control for   <7.0% adults with diabetes   11/08/2020 04:39 AM 5.6 4.8 - 5.6 % Final    Comment:    (NOTE) Pre diabetes:          5.7%-6.4%  Diabetes:              >6.4%  Glycemic control for   <7.0% adults with diabetes     CBG: Recent Labs  Lab 03/09/21 1547 03/09/21 1956 03/09/21 2327 03/10/21 0411 03/10/21 0735  GLUCAP 167* 157* 138* 154* 133*    Review of Systems:   Patient unable to provide due to encephalopathy   Allergies Allergies  Allergen Reactions  . Bupropion     Other reaction(s): Other (See Comments) Mood swings, angry Other reaction(s): Other (See Comments) Mood swings, angry     Scheduled  Meds: . chlorhexidine gluconate (MEDLINE KIT)  15 mL Mouth Rinse BID  . Chlorhexidine Gluconate Cloth  6 each Topical Q0600  . DULoxetine  120 mg Oral Daily  . epoetin (EPOGEN/PROCRIT) injection  4,000 Units Intravenous Q M,W,F-HD  . etomidate  20 mg Intravenous Once  . ezetimibe  10 mg Oral Daily  . insulin aspart  0-9 Units Subcutaneous Q4H  . mouth rinse  15  mL Mouth Rinse 10 times per day  . midodrine  10 mg Oral Q M,W,F-HD  . mirtazapine  45 mg Oral QHS  . pantoprazole  40 mg Oral BID  . rOPINIRole  4 mg Oral BID AC  . rosuvastatin  10 mg Oral QHS  . sevelamer carbonate  1,600 mg Oral TID WC  . sodium chloride flush  3 mL Intravenous Q12H  . sucralfate  1 g Oral TID   Continuous Infusions: . sodium chloride Stopped (02/16/2021 0909)  . amiodarone 30 mg/hr (03/10/21 0900)  . cefTRIAXone (ROCEPHIN)  IV 2 g (03/10/21 0948)  . fentaNYL infusion INTRAVENOUS 200 mcg/hr (03/10/21 0844)  . heparin 2,000 Units/hr (03/10/21 3710)  . norepinephrine (LEVOPHED) Adult infusion 2 mcg/min (02/12/2021 0443)  . phenylephrine (NEO-SYNEPHRINE) Adult infusion 400 mcg/min (03/10/21 1056)  . propofol (DIPRIVAN) infusion 10 mcg/kg/min (03/10/21 0922)  . vasopressin     PRN Meds:.acetaminophen, ALPRAZolam, benzonatate, clonazepam, guaiFENesin, hydrocortisone cream, lidocaine HCl (PF), metoprolol tartrate  PCCM: The patient is critically ill with multiple organ systems failure and requires high complexity decision making for assessment and support, frequent evaluation and titration of therapies, application of advanced monitoring technologies and extensive interpretation of multiple databases. Critical Care Time devoted to patient care services described in this note is 60 minutes minutes excluding procedure time.  Renold Don, MD [place] [facility]   *This note was dictated using voice recognition software/Dragon.  Despite best efforts to proofread, errors can occur which can change the meaning.  Any  change was purely unintentional.

## 2021-03-10 NOTE — Consult Note (Signed)
[place] for heparin Indication: atrial fibrillation  Allergies  Allergen Reactions  . Bupropion     Other reaction(s): Other (See Comments) Mood swings, angry Other reaction(s): Other (See Comments) Mood swings, angry    Patient Measurements: Height: 6' (182.9 cm) Weight: 123.1 kg (271 lb 6.2 oz) IBW/kg (Calculated) : 77.6 Heparin Dosing Weight: 101.9  Vital Signs: Temp: 97.8 F (36.6 C) (03/26 2000) Temp Source: Oral (03/26 2000) BP: 113/74 (03/27 0500) Pulse Rate: 70 (03/27 0600)  Labs: Recent Labs    03/06/2021 1051 02/19/2021 2357 03/08/21 0632 03/08/21 1123 03/08/21 2312 03/09/21 0543 03/09/21 1051 03/09/21 1512 03/09/21 2309 03/10/21 0443  HGB  --    < > 9.4*  --   --  9.0*  --   --   --  9.5*  HCT  --   --  31.0*  --   --  29.9*  --   --   --  31.8*  PLT  --   --  286  --   --  261  --   --   --  261  APTT  --    < >  --    < > 69* 65*  --  92*  --   --   LABPROT 20.0*  --   --   --   --   --   --   --   --   --   INR 1.8*  --   --   --   --   --   --   --   --   --   HEPARINUNFRC  --    < > 1.40*  --   --  0.74*  --   --  0.62 0.64  CREATININE  --    < > 7.29*  --   --  6.00* 6.22*  --   --  7.24*   < > = values in this interval not displayed.    Estimated Creatinine Clearance: 12.5 mL/min (A) (by C-G formula based on SCr of 7.24 mg/dL (H)).   Medications:  Apixaban 2.5 mg BID prior to admission  Assessment: 75 yo M presented with respiratory distress with hypotension and afib with RVR, HR 160. Pt receives HD MWF. PMH includes COPD, depression, diabetes, HLD, and HTN. Pt with symptomatic afib on 3/24 - cardioverted and started on heparin gtt. Heparin gtt was stopped 3/24 while pt was in cath lab. Pharmacy consulted to restart and monitor heparin.  Hgb: 9; plts: 261  Goal of Therapy:  aPTT goal 66-102 Heparin level 0.3-0.7 units/ml Monitor platelets by anticoagulation protocol: Yes    3/24 2357 aPTT 61,  subtherapeutic 3/25 1123 aPTT 62, subtherapeutic 3/25 2312 aPTT 69, therapeutic 3/26 0543 aPTT 65, subtherapeutic, HL 0.74 3/26 1512 aPTT 92, therapeutic 3/26 2309 HL 0.62, therapeutic 3/27 0443 HL 0.64, therapeutic x 2   Plan:  HL therapeutic, continue heparin infusion at 2000 units/hr Recheck HL daily with AM labs. Plan to restart Eliquis in approximately 24 hrs pending no additional procedures. Continue to monitor H&H and platelets  Renda Rolls, PharmD, [facility] 03/10/2021 6:28 AM

## 2021-03-10 NOTE — Progress Notes (Signed)
A bedside bronchoscopy was performed by Dr. Patsey Berthold and Frederich Cha RRT, RCP.  A timeout was performed at 0905.  Scope [id number] was inserted into the patients airway at 0906 and removed at 0910.  A BAL was performed and a specimen was obtained and sent to the lab.  Patient tolerated to procedure well and is resting on the ventilator with the following settings.  PRVC Vt=550, RR=16, FiO2=50%, PEEP=5.

## 2021-03-10 NOTE — Progress Notes (Signed)
  Amiodarone Drug - Drug Interaction Consult Note  Recommendations:  3/26: QTc has increased. Would evaluate. ?Recheck EKG  BP 92/64          EKG 3/22 @ 1538: QTc 492          EKG 3/25 @2020 : QTc 559  AST: 142>74>104, none 3/26 ALT: 110>98>128, none 3/26 Continue to monitor blood pressure, QTc, and AST/ALT daily while on amiodarone.   Amiodarone is metabolized by the cytochrome P450 system and therefore has the potential to cause many drug interactions. Amiodarone has an average plasma half-life of 50 days (range 20 to 100 days).   There is potential for drug interactions to occur several weeks or months after stopping treatment and the onset of drug interactions may be slow after initiating amiodarone.   [x]  Statins: Increased risk of myopathy. Simvastatin- restrict dose to 20mg  daily. Other statins: counsel patients to report any muscle pain or weakness immediately. On Rosuvastatin  []  Anticoagulants: Amiodarone can increase anticoagulant effect. Consider warfarin dose reduction. Patients should be monitored closely and the dose of anticoagulant altered accordingly, remembering that amiodarone levels take several weeks to stabilize.  []  Antiepileptics: Amiodarone can increase plasma concentration of phenytoin, the dose should be reduced. Note that small changes in phenytoin dose can result in large changes in levels. Monitor patient and counsel on signs of toxicity.  []  Beta blockers: increased risk of bradycardia, AV block and myocardial depression. Sotalol - avoid concomitant use.   []   Calcium channel blockers (diltiazem and verapamil): increased risk of bradycardia, AV block and myocardial depression.  []   Cyclosporine: Amiodarone increases levels of cyclosporine. Reduced dose of cyclosporine is recommended.  []  Digoxin dose should be halved when amiodarone is started.  []  Diuretics: increased risk of cardiotoxicity if hypokalemia occurs.  []  Oral hypoglycemic agents  (glyburide, glipizide, glimepiride): increased risk of hypoglycemia. Patient's glucose levels should be monitored closely when initiating amiodarone therapy.   [x]  Drugs that prolong the QT interval:  Torsades de pointes risk may be increased with concurrent use - avoid if possible.  Monitor QTc, also keep magnesium/potassium WNL if concurrent therapy can't be avoided. Marland Kitchen Antibiotics: e.g. fluoroquinolones, erythromycin. . Antiarrhythmics: e.g. quinidine, procainamide, disopyramide, sotalol. . Antipsychotics: e.g. phenothiazines, haloperidol.  . Lithium, tricyclic antidepressants, and methadone.  On Mirtazapine- can prolong QTc    Thank You,  Selita Staiger A  03/10/2021 9:45 AM

## 2021-03-10 NOTE — Progress Notes (Signed)
[place], [place] 03/10/21  Subjective:   LOS: 5  [name] is a 75 year old male currently followed by our office, Dr. Holley Raring.  He has a past medical history of ESRD on HD MWF, history of DVT on Eliquis, essential hypertension, chronic anxiety and depression, type 2 diabetes, and hyperlipidemia.  He presents to the emergency room with progressive shortness of breath and weakness for 5 days.  He was found to have a flutter on arrival to the ED with a heart rate of 160 and low BP.    On 3.26.22 visit Patient seen in the ICU today Patient major concern today visit was if it was okay for him to do peritoneal dialysis?  I answered patient's questions about modalities of renal placement therapy to the best of my ability I then examined the patient and discussed his rest of the renal related issues.   On 3.27.22 visit Patient was intubated this morning secondary to respiratory distress. CXR showed mucus plug vs effusion. Patient is to to undergo Bronchoscopy today in an effort to remove the plug  Objective:  Vital signs in last 24 hours:  Temp:  [97.6 F (36.4 C)-98 F (36.7 C)] 97.6 F (36.4 C) (03/27 0400) Pulse Rate:  [34-132] 70 (03/27 0600) Resp:  [17-36] 19 (03/27 0600) BP: (92-125)/(48-88) 113/74 (03/27 0500) SpO2:  [82 %-99 %] 97 % (03/27 0600) Weight:  [123.1 kg] 123.1 kg (03/27 0500)  Weight change: 5.3 kg Filed Weights   02/21/2021 1030 03/09/21 0500 03/10/21 0500  Weight: 115.9 kg 117.8 kg 123.1 kg    Intake/Output:    Intake/Output Summary (Last 24 hours) at 03/10/2021 5465 Last data filed at 03/10/2021 0600 Gross per 24 hour  Intake 1075.06 ml  Output -  Net 1075.06 ml     Physical Exam: General:  Intubated  HEENT Normocephalic.  ET tube in situ  Pulm/lungs  decreased sounds on the left side, rhonchi on the right side  CVS/Heart  S1-S2 regular but tachycardic  Abdomen:  Soft, non tender  Extremities: trace peripheral  edema  Neurologic: Awake, alert, conversant  Skin: No acute skin rash  Access: Rt IJ Permcth       Basic Metabolic Panel:  Recent Labs  Lab 03/06/21 0548 02/27/2021 0420 03/08/21 0632 03/09/21 0543 03/09/21 1051 03/10/21 0443  NA 138 136 136 137 136 135  K 4.0 4.0 3.6 3.6 3.5 3.9  CL 97* 96* 96* 99 98 98  CO2 25 25 23 26 26 23   GLUCOSE 111* 265* 158* 138* 154* 149*  BUN 72* 54* 69* 47* 49* 52*  CREATININE 7.12* 5.85* 7.29* 6.00* 6.22* 7.24*  CALCIUM 6.7* 7.0* 6.6* 7.1* 7.3* 8.1*  MG 1.8 1.8 2.1 2.0  --  2.2  PHOS 9.0* 7.3* 8.7* 5.5*  --  7.4*     CBC: Recent Labs  Lab 02/14/2021 1325 03/06/21 0548 03/06/21 2101 02/21/2021 0420 03/08/21 0632 03/09/21 0543 03/10/21 0443  WBC 8.1 9.2 7.4 9.8 8.7 7.7 9.0  NEUTROABS 6.7 7.4 5.9 8.6*  --   --   --   HGB 9.3* 9.3* 9.4* 9.3* 9.4* 9.0* 9.5*  HCT 30.2* 30.8* 31.2* 31.2* 31.0* 29.9* 31.8*  MCV 95.3 97.5 97.5 98.1 96.9 97.1 96.1  PLT 300 295 279 342 286 261 261      Lab Results  Component Value Date   HEPBSAG NON REACTIVE 03/08/2021   HEPBSAB NON REACTIVE 10/02/2020   HEPBIGM NON REACTIVE 10/02/2020      Microbiology:  Recent Results (from the past 240 hour(s))  Urine Culture     Status: None   Collection Time: 02/13/2021  2:51 PM   Specimen: Urine, Random  Result Value Ref Range Status   Specimen Description   Final    URINE, RANDOM Performed at [facility], [address] [place], [place] [address]    Special Requests   Final    NONE Performed at [facility], [address], [place], [place] [address]    Culture   Final    NO GROWTH Performed at [facility], [place] [address], [place], [place] [address]    Report Status 03/03/2021 FINAL  Final  Resp Panel by RT-PCR (Flu A&B, Covid) Nasopharyngeal Swab     Status: None   Collection Time: 02/20/2021  3:45 PM   Specimen: Nasopharyngeal Swab; Nasopharyngeal(NP) swabs in vial transport medium  Result Value Ref Range Status   SARS  Coronavirus 2 by RT PCR NEGATIVE NEGATIVE Final    Comment: (NOTE) SARS-CoV-2 target nucleic acids are NOT DETECTED.  The SARS-CoV-2 RNA is generally detectable in upper respiratory specimens during the acute phase of infection. The lowest concentration of SARS-CoV-2 viral copies this assay can detect is 138 copies/mL. A negative result does not preclude SARS-Cov-2 infection and should not be used as the sole basis for treatment or other patient management decisions. A negative result may occur with  improper specimen collection/handling, submission of specimen other than nasopharyngeal swab, presence of viral mutation(s) within the areas targeted by this assay, and inadequate number of viral copies(<138 copies/mL). A negative result must be combined with clinical observations, patient history, and epidemiological information. The expected result is Negative.  Fact Sheet for Patients:  EntrepreneurPulse.com.au  Fact Sheet for Healthcare Providers:  IncredibleEmployment.be  This test is no t yet approved or cleared by the [place] FDA and  has been authorized for detection and/or diagnosis of SARS-CoV-2 by FDA under an Emergency Use Authorization (EUA). This EUA will remain  in effect (meaning this test can be used) for the duration of the COVID-19 declaration under Section 564(b)(1) of the Act, 21 U.S.C.section 360bbb-3(b)(1), unless the authorization is terminated  or revoked sooner.       Influenza A by PCR NEGATIVE NEGATIVE Final   Influenza B by PCR NEGATIVE NEGATIVE Final    Comment: (NOTE) The Xpert Xpress SARS-CoV-2/FLU/RSV plus assay is intended as an aid in the diagnosis of influenza from Nasopharyngeal swab specimens and should not be used as a sole basis for treatment. Nasal washings and aspirates are unacceptable for Xpert Xpress SARS-CoV-2/FLU/RSV testing.  Fact Sheet for  Patients: EntrepreneurPulse.com.au  Fact Sheet for Healthcare Providers: IncredibleEmployment.be  This test is not yet approved or cleared by the [place] FDA and has been authorized for detection and/or diagnosis of SARS-CoV-2 by FDA under an Emergency Use Authorization (EUA). This EUA will remain in effect (meaning this test can be used) for the duration of the COVID-19 declaration under Section 564(b)(1) of the Act, 21 U.S.C. section 360bbb-3(b)(1), unless the authorization is terminated or revoked.  Performed at [facility], [place], [place] [address]   CULTURE, BLOOD (ROUTINE X 2) w Reflex to ID Panel     Status: None   Collection Time: 02/12/2021  6:17 PM   Specimen: BLOOD  Result Value Ref Range Status   Specimen Description BLOOD BLOOD RIGHT HAND  Final   Special Requests   Final    BOTTLES DRAWN  AEROBIC AND ANAEROBIC Blood Culture adequate volume   Culture   Final    NO GROWTH 5 DAYS Performed at [facility], [address] [place], [place] [address]    Report Status 03/10/2021 FINAL  Final  CULTURE, BLOOD (ROUTINE X 2) w Reflex to ID Panel     Status: None   Collection Time: 02/23/2021  6:18 PM   Specimen: BLOOD  Result Value Ref Range Status   Specimen Description BLOOD RIGHT ANTECUBITAL  Final   Special Requests   Final    BOTTLES DRAWN AEROBIC AND ANAEROBIC Blood Culture adequate volume   Culture   Final    NO GROWTH 5 DAYS Performed at [facility], [address] [place], [place] [address]    Report Status 03/10/2021 FINAL  Final  MRSA PCR Screening     Status: None   Collection Time: 03/06/21  9:20 AM   Specimen: Nasopharyngeal  Result Value Ref Range Status   MRSA by PCR NEGATIVE NEGATIVE Final    Comment:        The GeneXpert MRSA Assay (FDA approved for NASAL specimens only), is one component of a comprehensive MRSA colonization surveillance program. It is  not intended to diagnose MRSA infection nor to guide or monitor treatment for MRSA infections. Performed at [facility], [address] [place], [place] [address]   Body fluid culture w Gram Stain     Status: None (Preliminary result)   Collection Time: 02/22/2021  2:03 PM   Specimen: Pericardial; Body Fluid  Result Value Ref Range Status   Specimen Description   Final    PERICARDIAL Performed at [facility], [address], [place], [place] [address]    Special Requests   Final    PERICARDIAL Performed at [facility], [address] [place], [place] [address]    Gram Stain   Final    ABUNDANT WBC PRESENT,BOTH PMN AND MONONUCLEAR NO ORGANISMS SEEN    Culture   Final    NO GROWTH 2 DAYS Performed at [facility], [place] [address] [place], [place] [address]    Report Status PENDING  Incomplete    Coagulation Studies: Recent Labs    03/13/2021 1051  LABPROT 20.0*  INR 1.8*    Urinalysis: No results for input(s): COLORURINE, LABSPEC, PHURINE, GLUCOSEU, HGBUR, BILIRUBINUR, KETONESUR, PROTEINUR, UROBILINOGEN, NITRITE, LEUKOCYTESUR in the last 72 hours.  Invalid input(s): APPERANCEUR    Imaging: DG Chest Port 1 View  Result Date: 03/10/2021 CLINICAL DATA:  Shortness of breath, assess location of HD catheter EXAM: PORTABLE CHEST 1 VIEW COMPARISON:  03/08/2021 FINDINGS: Near complete opacification of the left hemithorax, with mild lucency/aeration in the central left upper lobe. This likely reflects mucous plugging given rapid change from prior, although a large left pleural effusion could also have this appearance. Right lung is essentially clear, noting a suspected trace pleural effusion. No pneumothorax. Cardiomegaly. Right chest dual lumen dialysis catheter is appropriately positioned in the cavoatrial junction. IMPRESSION: Near complete opacification of the left hemithorax, likely reflecting mucous plugging given rapid change from prior,  although a large left pleural effusion could also have this appearance. Right chest dual lumen dialysis catheter positioned in the cavoatrial junction. Electronically Signed   By: Julian Hy M.D.   On: 03/10/2021 06:27   DG Chest Port 1 View  Result Date: 03/08/2021 CLINICAL DATA:  Shortness of breath. EXAM: PORTABLE CHEST 1 VIEW COMPARISON:  Radiograph March 07, 2021 FINDINGS: Right  IJ central venous catheter tip projecting over the superior cavoatrial junction. Similar generalized cardiac enlargement. Pulmonary vascular congestion. Small left greater than right pleural effusions. Left basilar atelectasis. No focal consolidation or overt pulmonary edema. The visualized skeletal structures are unchanged. IMPRESSION: No significant interval change in the enlarged cardiac silhouette and small pleural effusion. Similar bibasilar atelectasis. Electronically Signed   By: Dahlia Bailiff MD   On: 03/08/2021 21:08     Medications:   . sodium chloride Stopped (03/13/2021 0909)  . amiodarone 30 mg/hr (03/10/21 0600)  . cefTRIAXone (ROCEPHIN)  IV Stopped (03/09/21 0908)  . dexmedetomidine (PRECEDEX) IV infusion 0.5 mcg/kg/hr (03/10/21 0723)  . fentaNYL infusion INTRAVENOUS    . heparin 2,000 Units/hr (03/10/21 1914)  . norepinephrine (LEVOPHED) Adult infusion 2 mcg/min (02/21/2021 0443)  . phenylephrine (NEO-SYNEPHRINE) Adult infusion 55 mcg/min (03/10/21 0723)   . Chlorhexidine Gluconate Cloth  6 each Topical Q0600  . DULoxetine  120 mg Oral Daily  . epoetin (EPOGEN/PROCRIT) injection  4,000 Units Intravenous Q M,W,F-HD  . ezetimibe  10 mg Oral Daily  . fentaNYL (SUBLIMAZE) injection  100 mcg Intravenous Once  . insulin aspart  0-9 Units Subcutaneous Q4H  . mouth rinse  15 mL Mouth Rinse BID  . midazolam  2 mg Intravenous Once  . midodrine  10 mg Oral Q M,W,F-HD  . mirtazapine  45 mg Oral QHS  . pantoprazole  40 mg Oral BID  . rocuronium  50 mg Intravenous Once  . rOPINIRole  4 mg Oral BID AC   . rosuvastatin  10 mg Oral QHS  . sevelamer carbonate  1,600 mg Oral TID WC  . sodium chloride flush  3 mL Intravenous Q12H  . sucralfate  1 g Oral TID   acetaminophen, ALPRAZolam, benzonatate, clonazepam, guaiFENesin, hydrocortisone cream, lidocaine HCl (PF), metoprolol tartrate      Assessment/ Plan:  75 y.o. male with past medical history of ESRD on HD MWF, history of DVT on Eliquis, essential hypertension, chronic anxiety and depression, type 2 diabetes, and hyperlipidemia was admitted on 03/14/2021   Active Problems:   UTI (urinary tract infection)  UTI (urinary tract infection) [N39.0] Acute cystitis without hematuria [N30.00] Generalized weakness [R53.1] Atypical atrial flutter ([place]) [I48.4]  [facility]/MWF/ Rt IJ Permcath  #. Renal  ESRD Pt is on hemodialysis. Patient is on Monday Wednesday Friday schedule Patient was last dialyzed on Friday No need for renal placement therapy today   #. Anemia of CKD  Lab Results  Component Value Date   HGB 9.5 (L) 03/10/2021  Patient hemoglobin is at goal Patient is on Epogen protocol For now we will continue Epogen 4000 units IV with dialysis..  #. Secondary hyperparathyroidism of renal origin N 25.81   Patient has history of secondary hyperparathyroidism with intact PTH levels being high and 137 as an outpatient   Lab Results  Component Value Date   PHOS 7.4 (H) 03/10/2021   Patient phosphorus on the higher side so patient was started on binders - Renvela 2 tablets p.o. 3 times daily with meals.  #. Diabetes type 2 with CKD Hemoglobin A1C (%)  Date Value  01/27/2014 5.5   Hgb A1c MFr Bld (%)  Date Value  03/06/2021 5.7 (H)  Stable with diet . # Hypotension Blood pressure is now stable Patient is on midodrine  #Pericardial effusion.  Patient is now status post pericardiocentesis.   # Dyspnea/acute respiratory failure Patient is currently intubated  CXR showed Near complete opacification of the  left hemithorax, with mild lucency/aeration in the central left upper lobe. This likely reflects mucous plugging given rapid change from prior, although a large left pleural effusion could also have this appearance.   Plan No need for renal replacement therapy today We will dialyze patient morning   LOS: 5 Manpreet s [facility] 3/27/20228:23 [place], [place]

## 2021-03-10 NOTE — Progress Notes (Signed)
PHARMACY CONSULT NOTE - FOLLOW UP  Pharmacy Consult for Electrolyte Monitoring and Replacement   Recent Labs: Potassium (mmol/L)  Date Value  03/10/2021 3.9  01/10/2015 4.3   Magnesium (mg/dL)  Date Value  03/10/2021 2.2  06/22/2014 1.7 (L)   Calcium (mg/dL)  Date Value  03/10/2021 8.1 (L)   Calcium, Total (mg/dL)  Date Value  01/10/2015 9.2   Albumin (g/dL)  Date Value  03/10/2021 2.8 (L)  06/22/2014 3.2 (L)   Phosphorus (mg/dL)  Date Value  03/10/2021 7.4 (H)  06/22/2014 2.7   Sodium (mmol/L)  Date Value  03/10/2021 135  01/10/2015 142   Corrected Ca: 9.1  Assessment: [name] is a 75 y.o. male admitted on 02/18/2021 with sepsis thought to be secondary to UTI. Pt is on HD MWF. Pharmacy has been consulted to assist in electrolyte monitoring and replacement as indicated.  Goal of Therapy:  K: 4 - 5.1  Mg: 2 - 2.4 All other electrolytes within normal limits   Plan:  K 3.9  Mag 2.2  Phos 7.4  Scr 7.24 (HD) Ca 8.1 (alb 2.8) Corrected Ca= 9.1  -No supplementation at this time. - Monitor electrolytes with AM lab  Chinita Greenland PharmD Clinical Pharmacist 03/10/2021

## 2021-03-10 NOTE — Progress Notes (Signed)
Patient increasingly confused throughout the night. Patient reports he has not slept much since he has been in the hospital. Patient given PRN meds and reoriented/redirected. Patient pulled and dislodged Perm Cath. NP, this RN, Danae Chen, RN, and Mya, NT to bedside to assist patient. Site cleaned with CHG, New dressing and bio patch placed on Perm Cath, chest x-ray, and precedex ordered. Will continue to monitor.

## 2021-03-11 ENCOUNTER — Inpatient Hospital Stay: Payer: Medicare Other

## 2021-03-11 DIAGNOSIS — R000 Tachycardia, unspecified: Secondary | ICD-10-CM

## 2021-03-11 DIAGNOSIS — R579 Shock, unspecified: Secondary | ICD-10-CM

## 2021-03-11 DIAGNOSIS — Z938 Other artificial opening status: Secondary | ICD-10-CM

## 2021-03-11 DIAGNOSIS — Z9911 Dependence on respirator [ventilator] status: Secondary | ICD-10-CM

## 2021-03-11 DIAGNOSIS — J90 Pleural effusion, not elsewhere classified: Secondary | ICD-10-CM

## 2021-03-11 DIAGNOSIS — I472 Ventricular tachycardia: Secondary | ICD-10-CM

## 2021-03-11 DIAGNOSIS — J9601 Acute respiratory failure with hypoxia: Secondary | ICD-10-CM | POA: Diagnosis not present

## 2021-03-11 DIAGNOSIS — Z7189 Other specified counseling: Secondary | ICD-10-CM

## 2021-03-11 DIAGNOSIS — E119 Type 2 diabetes mellitus without complications: Secondary | ICD-10-CM

## 2021-03-11 DIAGNOSIS — J9620 Acute and chronic respiratory failure, unspecified whether with hypoxia or hypercapnia: Secondary | ICD-10-CM

## 2021-03-11 DIAGNOSIS — I484 Atypical atrial flutter: Secondary | ICD-10-CM | POA: Diagnosis not present

## 2021-03-11 DIAGNOSIS — J9600 Acute respiratory failure, unspecified whether with hypoxia or hypercapnia: Secondary | ICD-10-CM

## 2021-03-11 DIAGNOSIS — I4891 Unspecified atrial fibrillation: Secondary | ICD-10-CM

## 2021-03-11 DIAGNOSIS — I3139 Other pericardial effusion (noninflammatory): Secondary | ICD-10-CM

## 2021-03-11 DIAGNOSIS — I313 Pericardial effusion (noninflammatory): Secondary | ICD-10-CM

## 2021-03-11 LAB — GLUCOSE, CAPILLARY
Glucose-Capillary: 106 mg/dL — ABNORMAL HIGH (ref 70–99)
Glucose-Capillary: 109 mg/dL — ABNORMAL HIGH (ref 70–99)
Glucose-Capillary: 112 mg/dL — ABNORMAL HIGH (ref 70–99)
Glucose-Capillary: 114 mg/dL — ABNORMAL HIGH (ref 70–99)
Glucose-Capillary: 114 mg/dL — ABNORMAL HIGH (ref 70–99)
Glucose-Capillary: 116 mg/dL — ABNORMAL HIGH (ref 70–99)
Glucose-Capillary: 116 mg/dL — ABNORMAL HIGH (ref 70–99)

## 2021-03-11 LAB — ECHOCARDIOGRAM LIMITED
Height: 72 in
S' Lateral: 2.74 cm
Weight: 4088.21 oz

## 2021-03-11 LAB — BODY FLUID CULTURE W GRAM STAIN: Culture: NO GROWTH

## 2021-03-11 LAB — LEGIONELLA PNEUMOPHILA SEROGP 1 UR AG: L. pneumophila Serogp 1 Ur Ag: NEGATIVE

## 2021-03-11 LAB — CBC
HCT: 28.7 % — ABNORMAL LOW (ref 39.0–52.0)
Hemoglobin: 8.7 g/dL — ABNORMAL LOW (ref 13.0–17.0)
MCH: 28.6 pg (ref 26.0–34.0)
MCHC: 30.3 g/dL (ref 30.0–36.0)
MCV: 94.4 fL (ref 80.0–100.0)
Platelets: 238 10*3/uL (ref 150–400)
RBC: 3.04 MIL/uL — ABNORMAL LOW (ref 4.22–5.81)
RDW: 15.9 % — ABNORMAL HIGH (ref 11.5–15.5)
WBC: 7.6 10*3/uL (ref 4.0–10.5)
nRBC: 0.4 % — ABNORMAL HIGH (ref 0.0–0.2)

## 2021-03-11 LAB — PHOSPHORUS: Phosphorus: 7.4 mg/dL — ABNORMAL HIGH (ref 2.5–4.6)

## 2021-03-11 LAB — BASIC METABOLIC PANEL
Anion gap: 16 — ABNORMAL HIGH (ref 5–15)
BUN: 61 mg/dL — ABNORMAL HIGH (ref 8–23)
CO2: 21 mmol/L — ABNORMAL LOW (ref 22–32)
Calcium: 7.5 mg/dL — ABNORMAL LOW (ref 8.9–10.3)
Chloride: 99 mmol/L (ref 98–111)
Creatinine, Ser: 8.18 mg/dL — ABNORMAL HIGH (ref 0.61–1.24)
GFR, Estimated: 6 mL/min — ABNORMAL LOW (ref >60)
Glucose, Bld: 112 mg/dL — ABNORMAL HIGH (ref 70–99)
Potassium: 3.9 mmol/L (ref 3.5–5.1)
Sodium: 136 mmol/L (ref 135–145)

## 2021-03-11 LAB — CYTOLOGY - NON PAP

## 2021-03-11 LAB — RENAL FUNCTION PANEL
Albumin: 2.5 g/dL — ABNORMAL LOW (ref 3.5–5.0)
Anion gap: 14 (ref 5–15)
BUN: 50 mg/dL — ABNORMAL HIGH (ref 8–23)
CO2: 21 mmol/L — ABNORMAL LOW (ref 22–32)
Calcium: 7.9 mg/dL — ABNORMAL LOW (ref 8.9–10.3)
Chloride: 101 mmol/L (ref 98–111)
Creatinine, Ser: 6.58 mg/dL — ABNORMAL HIGH (ref 0.61–1.24)
GFR, Estimated: 8 mL/min — ABNORMAL LOW (ref >60)
Glucose, Bld: 119 mg/dL — ABNORMAL HIGH (ref 70–99)
Phosphorus: 5.8 mg/dL — ABNORMAL HIGH (ref 2.5–4.6)
Potassium: 3.9 mmol/L (ref 3.5–5.1)
Sodium: 136 mmol/L (ref 135–145)

## 2021-03-11 LAB — TRIGLYCERIDES: Triglycerides: 125 mg/dL (ref <150)

## 2021-03-11 LAB — MAGNESIUM: Magnesium: 2 mg/dL (ref 1.7–2.4)

## 2021-03-11 LAB — HEPARIN LEVEL (UNFRACTIONATED): Heparin Unfractionated: 0.49 IU/mL (ref 0.30–0.70)

## 2021-03-11 LAB — PATHOLOGIST SMEAR REVIEW

## 2021-03-11 LAB — LACTIC ACID, PLASMA
Lactic Acid, Venous: 1.3 mmol/L (ref 0.5–1.9)
Lactic Acid, Venous: 1.2 mmol/L (ref 0.5–1.9)

## 2021-03-11 LAB — LACTATE DEHYDROGENASE: LDH: 168 U/L (ref 98–192)

## 2021-03-11 MED ORDER — HEPARIN SODIUM (PORCINE) 1000 UNIT/ML DIALYSIS
1000.0000 [IU] | INTRAMUSCULAR | Status: DC | PRN
  Filled 2021-03-11: qty 6

## 2021-03-11 MED ORDER — SODIUM CHLORIDE 0.9 % FOR CRRT
INTRAVENOUS_CENTRAL | Status: DC | PRN
  Filled 2021-03-11 (×3): qty 1000

## 2021-03-11 MED ORDER — PRISMASOL BGK 0/2.5 32-2.5 MEQ/L EC SOLN
Status: DC
  Filled 2021-03-11: qty 5000

## 2021-03-11 MED ORDER — MIDODRINE HCL 5 MG PO TABS
10.0000 mg | ORAL_TABLET | Freq: Three times a day (TID) | ORAL | Status: DC
  Administered 2021-03-11 – 2021-03-12 (×3): 10 mg
  Filled 2021-03-11 (×3): qty 2

## 2021-03-11 MED ORDER — CHLORHEXIDINE GLUCONATE CLOTH 2 % EX PADS: 6.0000 | MEDICATED_PAD | Freq: Every day | CUTANEOUS | Status: DC

## 2021-03-11 MED ORDER — GUAIFENESIN 100 MG/5ML PO SOLN
10.0000 mL | Freq: Three times a day (TID) | ORAL | Status: DC | PRN
  Filled 2021-03-11: qty 10

## 2021-03-11 MED ORDER — PANTOPRAZOLE SODIUM 40 MG PO PACK
40.0000 mg | PACK | Freq: Two times a day (BID) | ORAL | Status: DC
  Administered 2021-03-11 (×2): 40 mg
  Filled 2021-03-11 (×3): qty 20

## 2021-03-11 MED ORDER — CHLORHEXIDINE GLUCONATE CLOTH 2 % EX PADS
6.0000 | MEDICATED_PAD | Freq: Every day | CUTANEOUS | Status: DC
  Administered 2021-03-11: 6 via TOPICAL

## 2021-03-11 NOTE — Progress Notes (Signed)
All current medications change to per tube except Cymbalta and benzonatate. Changed Mucinex to guaifenesin liquid.

## 2021-03-11 NOTE — Consult Note (Signed)
Brief Pharmacy Note  Patient started on CRRT 3/28. Medication list reviewed. No adjustments indicated at this time. Will continue to monitor daily.  Benita Gutter  03/11/21

## 2021-03-11 NOTE — Progress Notes (Signed)
[place] for Electrolyte Monitoring and Replacement   Recent Labs: Potassium (mmol/L)  Date Value  03/11/2021 3.9  01/10/2015 4.3   Magnesium (mg/dL)  Date Value  03/11/2021 2.0  06/22/2014 1.7 (L)   Calcium (mg/dL)  Date Value  03/11/2021 7.5 (L)   Calcium, Total (mg/dL)  Date Value  01/10/2015 9.2   Albumin (g/dL)  Date Value  03/10/2021 2.8 (L)  06/22/2014 3.2 (L)   Phosphorus (mg/dL)  Date Value  03/11/2021 7.4 (H)  06/22/2014 2.7   Sodium (mmol/L)  Date Value  03/11/2021 136  01/10/2015 142   Assessment: [name] is a 75 y.o. male admitted on 02/25/2021 with sepsis thought to be secondary to UTI. Patient further with acute renal failure requiring RRT. Pharmacy has been consulted to assist in electrolyte monitoring and replacement as indicated.  CRRT initiated 3/28  Goal of Therapy:  K: 4 - 5.1  Mg: 2 - 2.4 All other electrolytes within normal limits   Plan:  3/28 AM labs: Na 136, K 3.9, Phos 7.4, Mg 2 --Potassium management per CRRT --Monitor for hypophosphatemia while on CRRT --Renal function panels ordered BID while on CRRT  [name] 03/11/2021

## 2021-03-11 NOTE — Progress Notes (Addendum)
Initial Nutrition Assessment  DOCUMENTATION CODES:   Obesity unspecified  INTERVENTION:   -Once pt is stable for TF, recommend:  Initiate Vital High Protein @ 60 ml/hr via OGT  90 ml Prosource TF BID.    Tube feeding regimen provides 1600 kcal (100% of needs), 170 grams of protein, and 1203 ml of H2O.   NUTRITION DIAGNOSIS:   Inadequate oral intake related to inability to eat as evidenced by NPO status.  GOAL:   Provide needs based on ASPEN/SCCM guidelines  MONITOR:   Vent status,Labs,Weight trends,Skin,I & O's  REASON FOR ASSESSMENT:   Ventilator    ASSESSMENT:   [name] is a 75 y.o. male with medical history significant for ESRD on HD MWF, history of DVT on Eliquis, essential hypertension, chronic anxiety/depression, type 2 diabetes, hyperlipidemia, who presented to [facility] due to gradually worsening shortness of breath, generalized weakness with onset on Friday 5 days ago and worsened over the weekend  Pt admitted with generalized weakness.   3/24- s/p pericardiocentesis 3/27- intubated, s/p bronch, s/p lt chest tube palcement secondary to large lt pleural effusion 3/28- CRRT initiated  Patient is currently intubated on ventilator support MV: 10.6 L/min Temp (24hrs), Avg:97.9 F (36.6 C), Min:97.16 F (36.2 C), Max:98.6 F (37 C)  Reviewed I/O's: +923 ml x 24 hours and +5.4 L since admission  UOP: 175 ml x 24 hours  Chest tube output: 1.8 L x 24 hours  MAP: 62  Case discussed with MD, RN, and during ICU rounds. MD suspects cardiogenic shock.   Per RN, pt is very "tense" today. Plan to start CRRT.   Pt with OGT, but not stable enough to start TF at this time.   Per nephrology notes, dry wt 113 kg. Pt with generalized edema on exam.   Medications reviewed and include colace, epogen, remeron, miralax, renvela, amidarone, norepinephrine, phenylephrine, and vasopressin.   Labs reviewed: Phos: 7.4, CBGS: 109-112 (inpatient orders for  glycemic control are 0-9 units insulin aspart every 4 hours).   NUTRITION - FOCUSED PHYSICAL EXAM:  Flowsheet Row Most Recent Value  Orbital Region No depletion  Upper Arm Region No depletion  Thoracic and Lumbar Region No depletion  Buccal Region No depletion  Temple Region No depletion  Clavicle Bone Region No depletion  Clavicle and Acromion Bone Region No depletion  Scapular Bone Region No depletion  Dorsal Hand No depletion  Patellar Region No depletion  Anterior Thigh Region No depletion  Posterior Calf Region No depletion  Edema (RD Assessment) Mild  Hair Reviewed  Eyes Reviewed  Mouth Reviewed  Skin Reviewed  Nails Reviewed       Diet Order:   Diet Order            Diet NPO time specified  Diet effective now                 EDUCATION NEEDS:   Not appropriate for education at this time  Skin:  Skin Assessment: Reviewed RN Assessment  Last BM:  03/10/21  Height:   Ht Readings from Last 1 Encounters:  03/10/21 6' (1.829 m)    Weight:   Wt Readings from Last 1 Encounters:  03/11/21 123.3 kg    Ideal Body Weight:  80.9 kg  BMI:  Body mass index is 36.87 kg/m.  Estimated Nutritional Needs:   Kcal:  8338-2505  Protein:  > 161 grams  Fluid:  > 1.3 L    Loistine Chance, RD, LDN, [place] Registered Dietitian II  Certified Diabetes Care and Education Specialist Please refer to [facility] for RD and/or RD on-call/weekend/after hours pager

## 2021-03-11 NOTE — Consult Note (Signed)
[place] for heparin Indication: atrial fibrillation  Allergies  Allergen Reactions  . Bupropion     Other reaction(s): Other (See Comments) Mood swings, angry Other reaction(s): Other (See Comments) Mood swings, angry    Patient Measurements: Height: 6' (182.9 cm) Weight: 123.3 kg (271 lb 13.2 oz) IBW/kg (Calculated) : 77.6 Heparin Dosing Weight: 101.9  Vital Signs: Temp: 97.9 F (36.6 C) (03/28 0600) Temp Source: Esophageal (03/28 0600) BP: 108/78 (03/28 0600) Pulse Rate: 92 (03/28 0600)  Labs: Recent Labs    03/08/21 2312 03/09/21 0543 03/09/21 1051 03/09/21 1512 03/09/21 2309 03/10/21 0443 03/11/21 0458  HGB  --  9.0*  --   --   --  9.5* 8.7*  HCT  --  29.9*  --   --   --  31.8* 28.7*  PLT  --  261  --   --   --  261 238  APTT 69* 65*  --  92*  --   --   --   HEPARINUNFRC  --  0.74*  --   --  0.62 0.64 0.49  CREATININE  --  6.00* 6.22*  --   --  7.24* 8.18*    Estimated Creatinine Clearance: 11.1 mL/min (A) (by C-G formula based on SCr of 8.18 mg/dL (H)).   Medications:  Apixaban 2.5 mg BID prior to admission  Assessment: 75 yo M presented with respiratory distress with hypotension and afib with RVR, HR 160. Pt receives HD MWF. PMH includes COPD, depression, diabetes, HLD, and HTN. Pt with symptomatic afib on 3/24 - cardioverted and started on heparin gtt. Heparin gtt was stopped 3/24 while pt was in cath lab. Pharmacy consulted to restart and monitor heparin.  Hgb: 9; plts: 261  Goal of Therapy:  aPTT goal 66-102 Heparin level 0.3-0.7 units/ml Monitor platelets by anticoagulation protocol: Yes    3/24 2357 aPTT 61, subtherapeutic 3/25 1123 aPTT 62, subtherapeutic 3/25 2312 aPTT 69, therapeutic 3/26 0543 aPTT 65, subtherapeutic, HL 0.74 3/26 1512 aPTT 92, therapeutic 3/26 2309 HL 0.62, therapeutic 3/27 0443 HL 0.64, therapeutic x 2 3/28 0458 HL 0.49, therapeutic x 3   Plan:  HL therapeutic, continue  heparin infusion at 2000 units/hr Recheck HL daily with AM labs. Plan to restart Eliquis in approximately 24 hrs pending no additional procedures. Continue to monitor H&H and platelets  Renda Rolls, PharmD, [facility] 03/11/2021 6:23 AM

## 2021-03-11 NOTE — Progress Notes (Signed)
[facility]  ROUNDING NOTE   Subjective:   UOP 118m Tmin 97.16 - on warming blanket now.   phenylephrine and vasopressin gtt. Midodrine  Intubated yesterday.   Objective:  Vital signs in last 24 hours:  Temp:  [97.16 F (36.2 C)-98.3 F (36.8 C)] 97.7 F (36.5 C) (03/28 0800) Pulse Rate:  [35-129] 72 (03/28 0800) Resp:  [0-31] 20 (03/28 0800) BP: (61-141)/(50-100) 82/53 (03/28 0800) SpO2:  [89 %-100 %] 97 % (03/28 0800) FiO2 (%):  [40 %-50 %] 40 % (03/28 0744) Weight:  [123.3 kg] 123.3 kg (03/28 0258)  Weight change: 0.2 kg Filed Weights   03/09/21 0500 03/10/21 0500 03/11/21 0258  Weight: 117.8 kg 123.1 kg 123.3 kg    Intake/Output: I/O last 3 completed shifts: In: 3324.7 [I.V.:3024.7; NG/GT:100; IV Piggyback:200] Out: 1955 [Urine:175; Chest Tube:1780]   Intake/Output this shift:  Total I/O In: 444.1 [I.V.:284.1; NG/GT:60; IV Piggyback:100] Out: 80 [Urine:30; Chest Tube:50]  Physical Exam: General: Critically ill  Head: ETT   Eyes: Anicteric, PERRL  Neck: trachea midline  Lungs:  PRVC FiO 40%, left chest tube  Heart: Irregular, atrial flutter  Abdomen:  Soft, obese,   Extremities: + peripheral edema.  Neurologic: Intubated and sedated  Skin: No lesions  Access: RIJ permcath    Basic Metabolic Panel: Recent Labs  Lab 02/25/2021 0420 03/08/21 0632 03/09/21 0543 03/09/21 1051 03/10/21 0443 03/11/21 0458  NA 136 136 137 136 135 136  K 4.0 3.6 3.6 3.5 3.9 3.9  CL 96* 96* 99 98 98 99  CO2 _0 21*  GLUCOSE 265* 158* 138* 154* 149* 112*  BUN 54* 69* 47* 49* 52* 61*  CREATININE 5.85* 7.29* 6.00* 6.22* 7.24* 8.18*  CALCIUM 7.0* 6.6* 7.1* 7.3* 8.1* 7.5*  MG 1.8 2.1 2.0  --  2.2 2.0  PHOS 7.3* 8.7* 5.5*  --  7.4* 7.4*    Liver Function Tests: Recent Labs  Lab 03/04/2021 1325 03/06/21 0548 03/01/2021 0420 03/09/21 1051 03/10/21 0443  AST 142* 74* 104* 49* 32  ALT 110* 98* 128* 103* 85*  ALKPHOS 88 83 91 77 77  BILITOT 0.9  0.9 0.9 0.7 0.8  PROT 6.4* 6.6 6.6 6.3* 6.6  ALBUMIN 3.0* 3.1* 3.3* 2.8* 2.8*   No results for input(s): LIPASE, AMYLASE in the last 168 hours. No results for input(s): AMMONIA in the last 168 hours.  CBC: Recent Labs  Lab 02/13/2021 1325 03/06/21 0548 03/06/21 2101 02/12/2021 0420 03/08/21 0751703/26/22 0543 03/10/21 0443 03/11/21 0458  WBC 8.1 9.2 7.4 9.8 8.7 7.7 9.0 7.6  NEUTROABS 6.7 7.4 5.9 8.6*  --   --   --   --   HGB 9.3* 9.3* 9.4* 9.3* 9.4* 9.0* 9.5* 8.7*  HCT 30.2* 30.8* 31.2* 31.2* 31.0* 29.9* 31.8* 28.7*  MCV 95.3 97.5 97.5 98.1 96.9 97.1 96.1 94.4  PLT 300 295 279 342 286 261 261 238    Cardiac Enzymes: No results for input(s): CKTOTAL, CKMB, CKMBINDEX, TROPONINI in the last 168 hours.  BNP: Invalid input(s): POCBNP  CBG: Recent Labs  Lab 03/10/21 1542 03/10/21 1923 03/10/21 2313 03/11/21 0349 03/11/21 0722  GLUCAP 97 105* 128* 109* 112*    Microbiology: Results for orders placed or performed during the hospital encounter of 02/18/2021  Urine Culture     Status: None   Collection Time: 03/08/2021  2:51 PM   Specimen: Urine, Random  Result Value Ref Range Status   Specimen Description   Final  URINE, RANDOM Performed at [facility], [address] [place], [place] [address]    Special Requests   Final    NONE Performed at [facility], [address], [place], [place] [address]    Culture   Final    NO GROWTH Performed at [facility], [place] [address] [place], [place] [address]    Report Status 02/21/2021 FINAL  Final  Resp Panel by RT-PCR (Flu A&B, Covid) Nasopharyngeal Swab     Status: None   Collection Time: 02/12/2021  3:45 PM   Specimen: Nasopharyngeal Swab; Nasopharyngeal(NP) swabs in vial transport medium  Result Value Ref Range Status   SARS Coronavirus 2 by RT PCR NEGATIVE NEGATIVE Final    Comment: (NOTE) SARS-CoV-2 target nucleic acids are NOT DETECTED.  The SARS-CoV-2 RNA is generally detectable in upper  respiratory specimens during the acute phase of infection. The lowest concentration of SARS-CoV-2 viral copies this assay can detect is 138 copies/mL. A negative result does not preclude SARS-Cov-2 infection and should not be used as the sole basis for treatment or other patient management decisions. A negative result may occur with  improper specimen collection/handling, submission of specimen other than nasopharyngeal swab, presence of viral mutation(s) within the areas targeted by this assay, and inadequate number of viral copies(<138 copies/mL). A negative result must be combined with clinical observations, patient history, and epidemiological information. The expected result is Negative.  Fact Sheet for Patients:  EntrepreneurPulse.com.au  Fact Sheet for Healthcare Providers:  IncredibleEmployment.be  This test is no t yet approved or cleared by the [place] FDA and  has been authorized for detection and/or diagnosis of SARS-CoV-2 by FDA under an Emergency Use Authorization (EUA). This EUA will remain  in effect (meaning this test can be used) for the duration of the COVID-19 declaration under Section 564(b)(1) of the Act, 21 U.S.C.section 360bbb-3(b)(1), unless the authorization is terminated  or revoked sooner.       Influenza A by PCR NEGATIVE NEGATIVE Final   Influenza B by PCR NEGATIVE NEGATIVE Final    Comment: (NOTE) The Xpert Xpress SARS-CoV-2/FLU/RSV plus assay is intended as an aid in the diagnosis of influenza from Nasopharyngeal swab specimens and should not be used as a sole basis for treatment. Nasal washings and aspirates are unacceptable for Xpert Xpress SARS-CoV-2/FLU/RSV testing.  Fact Sheet for Patients: EntrepreneurPulse.com.au  Fact Sheet for Healthcare Providers: IncredibleEmployment.be  This test is not yet approved or cleared by the [place] FDA and has been  authorized for detection and/or diagnosis of SARS-CoV-2 by FDA under an Emergency Use Authorization (EUA). This EUA will remain in effect (meaning this test can be used) for the duration of the COVID-19 declaration under Section 564(b)(1) of the Act, 21 U.S.C. section 360bbb-3(b)(1), unless the authorization is terminated or revoked.  Performed at [facility], [address] [place], [place] [address]   CULTURE, BLOOD (ROUTINE X 2) w Reflex to ID Panel     Status: None   Collection Time: 03/09/2021  6:17 PM   Specimen: BLOOD  Result Value Ref Range Status   Specimen Description BLOOD BLOOD RIGHT HAND  Final   Special Requests   Final    BOTTLES DRAWN AEROBIC AND ANAEROBIC Blood Culture adequate volume   Culture   Final    NO GROWTH 5 DAYS Performed at [facility], [address], [place],  [address]    Report Status 03/10/2021 FINAL  Final  CULTURE, BLOOD (ROUTINE  X 2) w Reflex to ID Panel     Status: None   Collection Time: 03/09/2021  6:18 PM   Specimen: BLOOD  Result Value Ref Range Status   Specimen Description BLOOD RIGHT ANTECUBITAL  Final   Special Requests   Final    BOTTLES DRAWN AEROBIC AND ANAEROBIC Blood Culture adequate volume   Culture   Final    NO GROWTH 5 DAYS Performed at [facility], [address] [place], [place] [address]    Report Status 03/10/2021 FINAL  Final  MRSA PCR Screening     Status: None   Collection Time: 03/06/21  9:20 AM   Specimen: Nasopharyngeal  Result Value Ref Range Status   MRSA by PCR NEGATIVE NEGATIVE Final    Comment:        The GeneXpert MRSA Assay (FDA approved for NASAL specimens only), is one component of a comprehensive MRSA colonization surveillance program. It is not intended to diagnose MRSA infection nor to guide or monitor treatment for MRSA infections. Performed at [facility], [address] [place], [place] [address]   Body fluid culture w Gram Stain      Status: None (Preliminary result)   Collection Time: 02/17/2021  2:03 PM   Specimen: Pericardial; Body Fluid  Result Value Ref Range Status   Specimen Description   Final    PERICARDIAL Performed at [facility], [address], [place], [place] [address]    Special Requests   Final    PERICARDIAL Performed at [facility], [address] [place], [place] [address]    Gram Stain   Final    ABUNDANT WBC PRESENT,BOTH PMN AND MONONUCLEAR NO ORGANISMS SEEN    Culture   Final    NO GROWTH 3 DAYS Performed at [facility], [place] [address], [place], [place] [address]    Report Status PENDING  Incomplete  Culture, BAL-quantitative w Gram Stain     Status: None (Preliminary result)   Collection Time: 03/10/21  9:49 AM   Specimen: Bronchoalveolar Lavage; Respiratory  Result Value Ref Range Status   Specimen Description   Final    BRONCHIAL ALVEOLAR LAVAGE Performed at [facility], [address] [place], [place] [address]    Special Requests   Final    NONE Performed at [facility], [address] [place], [place] [address]    Gram Stain   Final    ABUNDANT WBC PRESENT, PREDOMINANTLY MONONUCLEAR NO SQUAMOUS EPITHELIAL CELLS SEEN NO ORGANISMS SEEN Performed at [facility], [place] [address], [place], [place] [address]    Culture PENDING  Incomplete   Report Status PENDING  Incomplete  Body fluid culture w Gram Stain     Status: None (Preliminary result)   Collection Time: 03/10/21  3:49 PM   Specimen: Pleura; Body Fluid  Result Value Ref Range Status   Specimen Description   Final    PLEURAL FLUID Performed at [facility], [address], [place], [place] [address]    Special Requests   Final    NONE Performed at [facility], [address] [place], [place] [address]    Gram Stain   Final    RARE WBC PRESENT,BOTH PMN AND MONONUCLEAR NO ORGANISMS SEEN Performed at [facility], [place] [address], [place], [place] [address]    Culture PENDING  Incomplete   Report Status PENDING  Incomplete    Coagulation Studies: No  results for input(s): LABPROT, INR in the last 72 hours.  Urinalysis: Recent Labs    03/10/21 1305  COLORURINE YELLOW*  LABSPEC 1.017  PHURINE 5.0  GLUCOSEU NEGATIVE  HGBUR MODERATE*  BILIRUBINUR NEGATIVE  KETONESUR NEGATIVE  PROTEINUR 100*  NITRITE NEGATIVE  LEUKOCYTESUR TRACE*      Imaging: DG Abd 1 View  Result Date: 03/10/2021 CLINICAL DATA:  OG tube placement. EXAM: ABDOMEN - 1 VIEW COMPARISON:  None. FINDINGS: An OG tube is noted with tip overlying the proximal-mid stomach. No other significant changes noted. IMPRESSION: OG tube with tip overlying the proximal-mid stomach. Electronically Signed   By: Margarette Canada M.D.   On: 03/10/2021 10:51   CT CHEST WO CONTRAST  Result Date: 03/10/2021 CLINICAL DATA:  Atelectasis EXAM: CT CHEST WITHOUT CONTRAST TECHNIQUE: Multidetector CT imaging of the chest was performed following the standard protocol without IV contrast. COMPARISON:  02/21/2021 FINDINGS: Cardiovascular: No significant vascular findings. Cardiomegaly. Three-vessel coronary artery calcifications. Moderate pericardial effusion, increased in size compared to prior examination Mediastinum/Nodes: No enlarged mediastinal, hilar, or axillary lymph nodes. Thyroid gland, trachea, and esophagus demonstrate no significant findings. Lungs/Pleura: Endotracheal intubation, tip within the lower trachea. There is a new, large left pleural effusion and total atelectasis of the left lung. The left lower lobe airways are fluid-filled. There is a new, moderate right pleural effusion, with essentially complete atelectasis of the right lower lobe. Upper Abdomen: No acute abnormality. Esophagogastric tube with tip and side port below the diaphragm. Musculoskeletal: No chest wall mass or suspicious bone lesions identified. IMPRESSION: 1. There is a new, large left pleural  effusion and total atelectasis of the left lung. The left lower lobe airways are fluid-filled, suggesting aspiration. 2. There is a new, moderate right pleural effusion, with essentially complete atelectasis of the right lower lobe. 3. Moderate pericardial effusion, increased in size compared to prior examination. 4. Support apparatus including endotracheal tube appears appropriately positioned. 5. Cardiomegaly and coronary artery disease. Electronically Signed   By: Eddie Candle M.D.   On: 03/10/2021 10:56   DG Chest Port 1 View  Result Date: 03/11/2021 CLINICAL DATA:  Follow-up chest tube in pleural effusions. EXAM: PORTABLE CHEST 1 VIEW COMPARISON:  Chest radiograph March 10, 2021 FINDINGS: Stable position of the left-sided chest 2. Endotracheal tube with tip overlying the midthoracic trachea. Esophageal temperature probe. Right approach central venous catheter with tip overlying the superior cavoatrial junction. Nasogastric tube coursing below the diaphragm with tip obscured by collimation. Stable cardiomegaly. Similar layering small right pleural effusion. No visible left pleural effusion. Similar dependent airspace opacity. No visible pneumothorax. The visualized skeletal structures are unchanged. IMPRESSION: Stable chest radiograph with left chest tube in place. No visible left pleural effusion. Similar layering small right pleural effusion and dependent bibasilar airspace opacities. Electronically Signed   By: Dahlia Bailiff MD   On: 03/11/2021 03:02   DG Chest Port 1 View  Result Date: 03/10/2021 CLINICAL DATA:  Status post chest tube placement EXAM: PORTABLE CHEST 1 VIEW COMPARISON:  03/10/2021 FINDINGS: Interval placement of left-sided chest tube with essentially complete resolution of previously seen left pleural effusion. No significant pneumothorax. Persistent, layering right pleural effusion. Cardiomegaly. Unchanged support apparatus the endotracheal tube, esophagogastric tube, and large-bore  right neck multi lumen vascular catheter. IMPRESSION: 1. Interval placement of left-sided chest tube with essentially complete resolution of previously seen left pleural effusion. No significant pneumothorax. 2. Persistent, layering right pleural effusion. 3. Cardiomegaly. 4. Unchanged support apparatus. Electronically Signed   By: Cristie Hem  Laqueta Carina M.D.   On: 03/10/2021 16:19   DG Chest Port 1 View  Result Date: 03/10/2021 CLINICAL DATA:  Endotracheal and esophagogastric tube placement EXAM: PORTABLE CHEST 1 VIEW COMPARISON:  03/10/2021, 6:12 a.m. FINDINGS: Interval placement of endotracheal tube, tip projecting over the mid trachea. Interval placement of esophagogastric gastric tube, tip and side port below the diaphragm. Otherwise unchanged examination with cardiomegaly and total opacification of the left hemithorax. Diffuse interstitial opacity and probable small layering pleural effusion on the right. Large-bore right neck multi lumen vascular catheter. IMPRESSION: 1. Interval placement of endotracheal tube, tip projecting over the mid trachea. 2. Interval placement of esophagogastric gastric tube, tip and side port below the diaphragm. 3. Otherwise unchanged examination with cardiomegaly and total opacification of the left hemithorax, as well as edema and probable small pleural effusion on the right. Electronically Signed   By: Eddie Candle M.D.   On: 03/10/2021 09:12   DG Chest Port 1 View  Result Date: 03/10/2021 CLINICAL DATA:  Shortness of breath, assess location of HD catheter EXAM: PORTABLE CHEST 1 VIEW COMPARISON:  03/08/2021 FINDINGS: Near complete opacification of the left hemithorax, with mild lucency/aeration in the central left upper lobe. This likely reflects mucous plugging given rapid change from prior, although a large left pleural effusion could also have this appearance. Right lung is essentially clear, noting a suspected trace pleural effusion. No pneumothorax. Cardiomegaly. Right chest  dual lumen dialysis catheter is appropriately positioned in the cavoatrial junction. IMPRESSION: Near complete opacification of the left hemithorax, likely reflecting mucous plugging given rapid change from prior, although a large left pleural effusion could also have this appearance. Right chest dual lumen dialysis catheter positioned in the cavoatrial junction. Electronically Signed   By: Julian Hy M.D.   On: 03/10/2021 06:27     Medications:   . sodium chloride Stopped (02/20/2021 0909)  . amiodarone 30 mg/hr (03/11/21 0832)  . cefTRIAXone (ROCEPHIN)  IV Stopped (03/11/21 0805)  . fentaNYL infusion INTRAVENOUS 200 mcg/hr (03/11/21 0832)  . heparin 2,000 Units/hr (03/11/21 7425)  . norepinephrine (LEVOPHED) Adult infusion 2 mcg/min (02/17/2021 0443)  . phenylephrine (NEO-SYNEPHRINE) Adult infusion 210 mcg/min (03/11/21 0832)  . propofol (DIPRIVAN) infusion 15 mcg/kg/min (03/11/21 0832)  . vasopressin 0.03 Units/min (03/11/21 0832)   . chlorhexidine gluconate (MEDLINE KIT)  15 mL Mouth Rinse BID  . Chlorhexidine Gluconate Cloth  6 each Topical Q0600  . docusate  100 mg Per Tube BID  . DULoxetine  120 mg Oral Daily  . epoetin (EPOGEN/PROCRIT) injection  4,000 Units Intravenous Q M,W,F-HD  . ezetimibe  10 mg Per Tube Daily  . insulin aspart  0-9 Units Subcutaneous Q4H  . mouth rinse  15 mL Mouth Rinse 10 times per day  . midodrine  10 mg Per Tube Q M,W,F-HD  . mirtazapine  45 mg Per Tube QHS  . pantoprazole  40 mg Oral BID  . polyethylene glycol  17 g Per Tube Daily  . rOPINIRole  4 mg Per Tube BID AC  . rosuvastatin  10 mg Per Tube QHS  . sevelamer carbonate  1,600 mg Per Tube TID WC  . sodium chloride flush  3 mL Intravenous Q12H  . sucralfate  1 g Per Tube TID   acetaminophen, ALPRAZolam, benzonatate, clonazepam, guaiFENesin, hydrocortisone cream, lidocaine HCl (PF), metoprolol tartrate  Assessment/ Plan:  [name] is a 75 y.o. white male with end stage  renal disease on hemodialysis, hypertension, sleep apnea, hyperlipidemia, diabetes mellitus type II,  COPD, depression, gastric bypass, renal cell carcinoma status post nephrectomy who is admitted to [facility] on 03/03/2021 for UTI (urinary tract infection) [N39.0] Acute cystitis without hematuria [N30.00] Generalized weakness [R53.1] Atypical atrial flutter ([place]) [I48.4]  [facility] [place] [facility] permcath 113kg  1. End Stage Renal Disease: requiring renal replacement therapy.  - CVVHD due to hemodynamic instability.   2. Hypotension: secondary to sepsis and shock. Status post pericardiocentesis for pericardial effusion.   Requiring vasopressors: norepinephrine, vasopressin, phenylephrine. - make midodrine three times a day.   3. Acute respiratory failure with empyema: requiring intubation and mechanical ventilation.   4. Atrial fibrillation with ventricular tachycardia  - amiodarone gtt  5. 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    LOS: 6   3/28/20228:55 AM

## 2021-03-11 NOTE — Progress Notes (Signed)
[facility]    SUBJECTIVE: Intubated sedated   Vitals:   03/11/21 0500 03/11/21 0600 03/11/21 0700 03/11/21 0715  BP: 94/61 108/78 (!) 94/53 (!) 99/55  Pulse: 93 92 92 (!) 42  Resp: 20 20 20 20   Temp: 97.9 F (36.6 C) 97.9 F (36.6 C) (!) 97.16 F (36.2 C) (!) 97.34 F (36.3 C)  TempSrc: Esophageal Esophageal  Esophageal  SpO2: 97% 96% 98% 98%  Weight:      Height:         Intake/Output Summary (Last 24 hours) at 03/11/2021 0720 Last data filed at 03/11/2021 0600 Gross per 24 hour  Intake 2878.4 ml  Output 1955 ml  Net 923.4 ml      PHYSICAL EXAM  General: Well developed, well nourished, in no acute distress HEENT:  Normocephalic and atramatic Neck:  No JVD.  Lungs: Clear bilaterally to auscultation and percussion. Heart: HRRR . Normal S1 and S2 without gallops or murmurs.  Abdomen: Bowel sounds are positive, abdomen soft and non-tender  Msk:  Back normal, normal gait. Normal strength and tone for age. Extremities: No clubbing, cyanosis or edema.   Neuro: Alert and oriented X 3. Psych:  Good affect, responds appropriately   LABS: Basic Metabolic Panel: Recent Labs    03/10/21 0443 03/11/21 0458  NA 135 136  K 3.9 3.9  CL 98 99  CO2 23 21*  GLUCOSE 149* 112*  BUN 52* 61*  CREATININE 7.24* 8.18*  CALCIUM 8.1* 7.5*  MG 2.2 2.0  PHOS 7.4* 7.4*   Liver Function Tests: Recent Labs    03/09/21 1051 03/10/21 0443  AST 49* 32  ALT 103* 85*  ALKPHOS 77 77  BILITOT 0.7 0.8  PROT 6.3* 6.6  ALBUMIN 2.8* 2.8*   No results for input(s): LIPASE, AMYLASE in the last 72 hours. CBC: Recent Labs    03/10/21 0443 03/11/21 0458  WBC 9.0 7.6  HGB 9.5* 8.7*  HCT 31.8* 28.7*  MCV 96.1 94.4  PLT 261 238   Cardiac Enzymes: No results for input(s): CKTOTAL, CKMB, CKMBINDEX, TROPONINI in the last 72 hours. BNP: Invalid input(s): POCBNP D-Dimer: No results for input(s): DDIMER in the last 72 hours. Hemoglobin A1C: No results for input(s): HGBA1C in  the last 72 hours. Fasting Lipid Panel: Recent Labs    03/11/21 0458  TRIG 125   Thyroid Function Tests: No results for input(s): TSH, T4TOTAL, T3FREE, THYROIDAB in the last 72 hours.  Invalid input(s): FREET3 Anemia Panel: No results for input(s): VITAMINB12, FOLATE, FERRITIN, TIBC, IRON, RETICCTPCT in the last 72 hours.  DG Abd 1 View  Result Date: 03/10/2021 CLINICAL DATA:  OG tube placement. EXAM: ABDOMEN - 1 VIEW COMPARISON:  None. FINDINGS: An OG tube is noted with tip overlying the proximal-mid stomach. No other significant changes noted. IMPRESSION: OG tube with tip overlying the proximal-mid stomach. Electronically Signed   By: Margarette Canada M.D.   On: 03/10/2021 10:51   CT CHEST WO CONTRAST  Result Date: 03/10/2021 CLINICAL DATA:  Atelectasis EXAM: CT CHEST WITHOUT CONTRAST TECHNIQUE: Multidetector CT imaging of the chest was performed following the standard protocol without IV contrast. COMPARISON:  02/21/2021 FINDINGS: Cardiovascular: No significant vascular findings. Cardiomegaly. Three-vessel coronary artery calcifications. Moderate pericardial effusion, increased in size compared to prior examination Mediastinum/Nodes: No enlarged mediastinal, hilar, or axillary lymph nodes. Thyroid gland, trachea, and esophagus demonstrate no significant findings. Lungs/Pleura: Endotracheal intubation, tip within the lower trachea. There is a new, large left pleural effusion and total atelectasis  of the left lung. The left lower lobe airways are fluid-filled. There is a new, moderate right pleural effusion, with essentially complete atelectasis of the right lower lobe. Upper Abdomen: No acute abnormality. Esophagogastric tube with tip and side port below the diaphragm. Musculoskeletal: No chest wall mass or suspicious bone lesions identified. IMPRESSION: 1. There is a new, large left pleural effusion and total atelectasis of the left lung. The left lower lobe airways are fluid-filled, suggesting  aspiration. 2. There is a new, moderate right pleural effusion, with essentially complete atelectasis of the right lower lobe. 3. Moderate pericardial effusion, increased in size compared to prior examination. 4. Support apparatus including endotracheal tube appears appropriately positioned. 5. Cardiomegaly and coronary artery disease. Electronically Signed   By: Eddie Candle M.D.   On: 03/10/2021 10:56   DG Chest Port 1 View  Result Date: 03/11/2021 CLINICAL DATA:  Follow-up chest tube in pleural effusions. EXAM: PORTABLE CHEST 1 VIEW COMPARISON:  Chest radiograph March 10, 2021 FINDINGS: Stable position of the left-sided chest 2. Endotracheal tube with tip overlying the midthoracic trachea. Esophageal temperature probe. Right approach central venous catheter with tip overlying the superior cavoatrial junction. Nasogastric tube coursing below the diaphragm with tip obscured by collimation. Stable cardiomegaly. Similar layering small right pleural effusion. No visible left pleural effusion. Similar dependent airspace opacity. No visible pneumothorax. The visualized skeletal structures are unchanged. IMPRESSION: Stable chest radiograph with left chest tube in place. No visible left pleural effusion. Similar layering small right pleural effusion and dependent bibasilar airspace opacities. Electronically Signed   By: Dahlia Bailiff MD   On: 03/11/2021 03:02   DG Chest Port 1 View  Result Date: 03/10/2021 CLINICAL DATA:  Status post chest tube placement EXAM: PORTABLE CHEST 1 VIEW COMPARISON:  03/10/2021 FINDINGS: Interval placement of left-sided chest tube with essentially complete resolution of previously seen left pleural effusion. No significant pneumothorax. Persistent, layering right pleural effusion. Cardiomegaly. Unchanged support apparatus the endotracheal tube, esophagogastric tube, and large-bore right neck multi lumen vascular catheter. IMPRESSION: 1. Interval placement of left-sided chest tube with  essentially complete resolution of previously seen left pleural effusion. No significant pneumothorax. 2. Persistent, layering right pleural effusion. 3. Cardiomegaly. 4. Unchanged support apparatus. Electronically Signed   By: Eddie Candle M.D.   On: 03/10/2021 16:19   DG Chest Port 1 View  Result Date: 03/10/2021 CLINICAL DATA:  Endotracheal and esophagogastric tube placement EXAM: PORTABLE CHEST 1 VIEW COMPARISON:  03/10/2021, 6:12 a.m. FINDINGS: Interval placement of endotracheal tube, tip projecting over the mid trachea. Interval placement of esophagogastric gastric tube, tip and side port below the diaphragm. Otherwise unchanged examination with cardiomegaly and total opacification of the left hemithorax. Diffuse interstitial opacity and probable small layering pleural effusion on the right. Large-bore right neck multi lumen vascular catheter. IMPRESSION: 1. Interval placement of endotracheal tube, tip projecting over the mid trachea. 2. Interval placement of esophagogastric gastric tube, tip and side port below the diaphragm. 3. Otherwise unchanged examination with cardiomegaly and total opacification of the left hemithorax, as well as edema and probable small pleural effusion on the right. Electronically Signed   By: Eddie Candle M.D.   On: 03/10/2021 09:12   DG Chest Port 1 View  Result Date: 03/10/2021 CLINICAL DATA:  Shortness of breath, assess location of HD catheter EXAM: PORTABLE CHEST 1 VIEW COMPARISON:  03/08/2021 FINDINGS: Near complete opacification of the left hemithorax, with mild lucency/aeration in the central left upper lobe. This likely reflects mucous plugging given rapid  change from prior, although a large left pleural effusion could also have this appearance. Right lung is essentially clear, noting a suspected trace pleural effusion. No pneumothorax. Cardiomegaly. Right chest dual lumen dialysis catheter is appropriately positioned in the cavoatrial junction. IMPRESSION: Near  complete opacification of the left hemithorax, likely reflecting mucous plugging given rapid change from prior, although a large left pleural effusion could also have this appearance. Right chest dual lumen dialysis catheter positioned in the cavoatrial junction. Electronically Signed   By: Julian Hy M.D.   On: 03/10/2021 06:27     Echo normal left ventricular function ejection fraction of 60%  TELEMETRY: Atrial flutter rate around 65 nonspecific ST-T wave changes:  ASSESSMENT AND PLAN:  Active Problems:   UTI (urinary tract infection) Respiratory failure End-stage renal disease on dialysis Small pericardial effusion status post pericardiocentesis Large left pleural effusion status post thoracentesis with chest tube in place Obesity Congestive heart failure Hypotension Atrial fibrillation paroxysmal  Plan Agree with ICU level care Continue respiratory support with the vent Treatment work-up as per critical care team Maintain chest tube for large pleural effusion Recommend echocardiogram today to reassess pericardial effusion Continue dialysis therapy for end-stage renal disease Maintain rate control for atrial fibrillation atrial flutter Short-term anticoagulation for now but will need long-term anticoagulation because of significant CHADS2 score Do not anticipate any invasive procedures cardiac cath or pericardiocentesis at this point   Yolonda Kida, MD 03/11/2021 7:20 AM

## 2021-03-11 NOTE — Progress Notes (Signed)
GOALS OF CARE DISCUSSION  The Clinical status was relayed to family in detail. Daughter Angela Nevin  Updated and notified of patients medical condition.  Patient remains unresponsive and will not open eyes to command.    Patient is having a weak cough and struggling to remove secretions.   Patient with increased WOB and using accessory muscles to breathe Explained to family course of therapy and the modalities     Patient with Progressive multiorgan failure with a very high probablity of a very minimal chance of meaningful recovery despite all aggressive and optimal medical therapy.   Family understands the situation.  Patient remains FULL CODE  Family are satisfied with Plan of action and management. All questions answered  Additional CC time 42 mins   Zubin Pontillo Patricia Pesa, M.D.  [facility]  Medical Director Helen Director [facility] Cardio-Pulmonary Department

## 2021-03-11 NOTE — Progress Notes (Addendum)
NAME:  [name], MRN:  [id number], DOB:  1945-12-19, LOS: 6 ADMISSION DATE:  02/15/2021, CONSULTATION DATE: 03/06/2021 REFERRING MD: Sharion Settler, NP, CHIEF COMPLAINT: Hypotension    History of Present Illness:  This is a 75 yo male who presented to [facility] ER via EMS on 03/22 from home via EMS with c/o shortness of breath, fatigue, and weakness onset several weeks priors to presentation.  He has a hx of ESRD requiring HD M-W-F, last HD session 03/21, however unable to complete the full session due to hypotension. At baseline he voids some although in the last few days he has been unable to void. He reports he has had weakness and shortness of breath after he had a clogged dialysis catheter that required catheter lysis on 03/9.  He subsequently presented to the ER on 03/10 with chest pain.  CTA Chest performed which was negative for PE although dilated pulmonary arteries were noted along with a small pericardial effusion, and 3-vessel coronary atherosclerosis.  During that presentation bp marginal spb 90's.  ER physician discussed the case with on call Nephrologist Dr. Harvest Dark who advised he would monitor the pt closely during his scheduled HD session the next day, and pt did not require hospital admission.  ED course Upon arrival to the ER EKG revealed atrial flutter vs. atrial fibrillation with rvr.  Pt also hypotensive and diaphoretic.  He subsequently received low dose etomidate and underwent synchronized cardioversion which was successful cardiac rhythm sinus rhythm.  Pts hypotension improved post cardioversion.  CXR concerning for pericardial effusion, left pleural effusion, and left lower lobe airspace disease. UA concerning for UTI. Abx therapy initiated. ER physician consulted cardiology and nephrology. Hospitalist team admitted pt to the medsurg unit for additional workup and treatment.   Hospital course On 03/23 pt required transfer to the stepdown unit with wide complex  tachycardia hr. 140-150's and hypotension sbp 60-70's.  He required amiodarone bolus followed by continuous gtt and peripheral levophed.  PCCM team consulted to assist with management.    Pertinent  Medical History  OSA Renal Cell Carcinoma HTN HLD Type II Diabetes Mellitus Depression COPD Chronic Back Pain ESRD HD M-W-F  Significant Hospital Events: Including procedures, antibiotic start and stop dates in addition to other pertinent events   03/22: Pt presented to [facility] ER with weakness and shortness of breath.  Found to be hypotensive and cardiac rhythm atrial fibrillation with rvr.  Underwent successful cardioversion with improvement in bp 03/23: Pt admitted to the medsurg unit per hospitalist team for additional workup and treatment 03/23: Pt required transfer to the stepdown unit with wide complex tachycardia and hypotension requiring levophed and amiodarone gtts. PCCM team consulted to assist with management 03/27: Has complete atelectasis of the left lung, required intubation due to respiratory failure with hypercapnia.  Persistent A. fib flutter, CT chest ordered. Left Chest tube placed for large L Pleural Effusion 03/28: Critically ill, requiring Neo and Vasopressin.  Initial analysis of Pleural fluid consistent with Transudate, Cultures pending (both Pleural fluid and Bronchial alveolar lavage),Smear review of Pleural fluid is NEGATIVE for Malignancy;  to be started to CRRT  Interim History / Subjective:  -Required intubation and Bronchoscopy yesterday 03/10/21, along with  L chest tube placement -Critically ill, requiring Neosynephrine and Vasopressin -Initial analysis of Pleural Fluid is consistent with Transudate; cultures still pending (both Pleural fluid and Bronchial alveolar lavage) -Smear review of Pleural fluid is NEGATIVE for Malignancy -CRRT to be initiated today -Hypothermic on warming blanket  Objective   Blood pressure (!) 82/53, pulse 72, temperature 97.7 F  (36.5 C), resp. rate 20, height 6' (1.829 m), weight 123.3 kg, SpO2 97 %.    Vent Mode: PRVC FiO2 (%):  [40 %-50 %] 40 % Set Rate:  [16 bmp-20 bmp] 20 bmp Vt Set:  [550 mL] 550 mL PEEP:  [5 cmH20] 5 cmH20 Plateau Pressure:  [21 cmH20-24 cmH20] 21 cmH20   Intake/Output Summary (Last 24 hours) at 03/11/2021 0858 Last data filed at 03/11/2021 0973 Gross per 24 hour  Intake 3322.46 ml  Output 2035 ml  Net 1287.46 ml   Filed Weights   03/09/21 0500 03/10/21 0500 03/11/21 0258  Weight: 117.8 kg 123.1 kg 123.3 kg   Examination: General: Critically ill appearing male, laying in bed, intubated and sedated, in NAD HEENT: Atraumatic, normocephalic, neck supple, no JVD Lungs: Mechanical breath sounds bilaterally, synchronous with the vent, even Cardiovascular: Irregularly irregular rhythm (A-fib), rate controlled, no M/R/G, 2+ radial/1+ DP pulses, 1+ bilateral LE edema Abdomen: Obese, soft, nontender, nondistended, no guarding or rebound tenderness, BS+ x4 Extremities: left leg bulk larger which is his baseline, no clubbing Neuro: Sedated, withdraws from pain, pupils PERRL GU: Foley in place  Labs/imaging that I havepersonally reviewed   Bicarb 21, AG 16, Glucose 112, BUN 61, Cr. 8.18, WBC 7.6, RBC 3, Hgb 8.7; ABG: pH 7.32/pCO2 40/pO2 114/Bicarb 20.6 3/27: CT Chest>>1. There is a new, large left pleural effusion and total atelectasis of the left lung. The left lower lobe airways are fluid-filled, suggesting aspiration. 2. There is a new, moderate right pleural effusion, with essentially complete atelectasis of the right lower lobe. 3. Moderate pericardial effusion, increased in size compared to prior examination. 4. Support apparatus including endotracheal tube appears appropriately positioned. 5. Cardiomegaly and coronary artery disease. 3/28: CXR>>Stable chest radiograph with left chest tube in place. No visible left pleural effusion. Similar layering small right pleural effusion and  dependent bibasilar airspace opacities.     Resolved Hospital Problem list   N/A  Assessment & Plan:   Acute on chronic respiratory failure secondary to vascular congestion in the setting of ESRD, Large Left Pleural Effusion, & Complete left lung atelectasis  Hx: COPD and OSA -Full vent support -Wean FiO2 & PEEP to maintain O2 saturations >92% -Follow intermittent CXR & ABG as needed -Implement VAP Bundle -Spontaneous breathing trials when respiratory parameters met -Prn Bronchodilators -Dialysis vs. CRRT for volume removal -S/p Left Chest tube placement on 03/10/21 -Initial analysis of Pleural Fluid is consistent with Transudate [Lights Criteria: Pleural Protein 3.1 / Serum Protein 6.6 = 0.46;  Pleural LDH is 165 (which is <2/3 UNL of serum LDH)] -Smear review of Pleural fluid is NEGATIVE for Malignancy -Cultures still pending (both Pleural fluid and Bronchial alveolar lavage)   Wide complex tachycardia  Atrial fibrillation with rvr s/p synchronized cardioversion 03/22 A. Fib/flutter recurred Echo 03/23~moderate pericardial effusion no evidence of cardiac tamponade Status post pericardiocentesis Mildly elevated troponin likely demand ischemia  Hypotension likely cardiogenic shock +/- Septic Hx: HTN and HLD -Continuous cardiac monitoring -Maintain MAP >65 -Vasopressors as needed to maintain MAP goal (currently on Neosynephrine and Vasopressin) -Continue Midodrine -S/p Pericardiocentesis -Repeat Echocardiogram pending to check on Pericardial effusion -Cardiology following, appreciate input -Heparin & Amiodarone gtt's   ESRD on HD M-W-F  Hx: Renal cell carcinoma  -Monitor I&O's / urinary output -Follow BMP -Ensure adequate renal perfusion -Avoid nephrotoxic agents as able -Replace electrolytes as indicated -Nephrology following, appreciate input -HD vs CRRT as per  Nephrology   UTI -Trend WBC and monitor fever curve -Trend PCT  -Follow cultures  -Continue  Ceftriaxone for now  -Repeat UA negative   Anemia of Chronic Disease -Monitor for S/Sx of bleeding -Trend CBC -Heparin gtt for Anticoagulation/VTE Prophylaxis  -Transfuse for Hgb <7   Type II diabetes mellitus  -CBG's -SSI -Follow ICU Hypo/Hyperglycemia protocol   Depression  Continue remeron and cymbalta    Best practice (evaluated daily)  Diet:  NPO Pain/Anxiety/Delirium protocol (if indicated): Fentanyl gtt VAP protocol (if indicated): Yes, implemented DVT prophylaxis: Systemic AC GI prophylaxis: PPI Glucose control:  SSI No Central venous access:  Yes, still indicated Arterial line:  N/A Foley:  Yes, still indicated Mobility:  bed rest  PT consulted: N/A Last date of multidisciplinary goals of care discussion: 03/11/2021 Code Status:  full code Disposition: ICU  Labs   CBC: Recent Labs  Lab 02/25/2021 1325 03/06/21 0548 03/06/21 2101 02/27/2021 0420 03/08/21 3734 03/09/21 0543 03/10/21 0443 03/11/21 0458  WBC 8.1 9.2 7.4 9.8 8.7 7.7 9.0 7.6  NEUTROABS 6.7 7.4 5.9 8.6*  --   --   --   --   HGB 9.3* 9.3* 9.4* 9.3* 9.4* 9.0* 9.5* 8.7*  HCT 30.2* 30.8* 31.2* 31.2* 31.0* 29.9* 31.8* 28.7*  MCV 95.3 97.5 97.5 98.1 96.9 97.1 96.1 94.4  PLT 300 295 279 342 286 261 261 287    Basic Metabolic Panel: Recent Labs  Lab 03/14/2021 0420 03/08/21 0632 03/09/21 0543 03/09/21 1051 03/10/21 0443 03/11/21 0458  NA 136 136 137 136 135 136  K 4.0 3.6 3.6 3.5 3.9 3.9  CL 96* 96* 99 98 98 99  CO2 25 23 26 26 23  21*  GLUCOSE 265* 158* 138* 154* 149* 112*  BUN 54* 69* 47* 49* 52* 61*  CREATININE 5.85* 7.29* 6.00* 6.22* 7.24* 8.18*  CALCIUM 7.0* 6.6* 7.1* 7.3* 8.1* 7.5*  MG 1.8 2.1 2.0  --  2.2 2.0  PHOS 7.3* 8.7* 5.5*  --  7.4* 7.4*   GFR: Estimated Creatinine Clearance: 11.1 mL/min (A) (by C-G formula based on SCr of 8.18 mg/dL (H)). Recent Labs  Lab 03/06/21 2101 03/06/21 2324 02/17/2021 0420 03/08/21 6811 03/09/21 0543 03/10/21 0443 03/11/21 0458   PROCALCITON 1.05  --  1.01 1.56  --   --   --   WBC 7.4  --  9.8 8.7 7.7 9.0 7.6  LATICACIDVEN 2.2* 1.6  --   --   --   --   --     Liver Function Tests: Recent Labs  Lab 02/12/2021 1325 03/06/21 0548 03/05/2021 0420 03/09/21 1051 03/10/21 0443  AST 142* 74* 104* 49* 32  ALT 110* 98* 128* 103* 85*  ALKPHOS 88 83 91 77 77  BILITOT 0.9 0.9 0.9 0.7 0.8  PROT 6.4* 6.6 6.6 6.3* 6.6  ALBUMIN 3.0* 3.1* 3.3* 2.8* 2.8*   No results for input(s): LIPASE, AMYLASE in the last 168 hours. No results for input(s): AMMONIA in the last 168 hours.  ABG    Component Value Date/Time   PHART 7.32 (L) 03/11/2021 0444   PCO2ART 40 03/11/2021 0444   PO2ART 114 (H) 03/11/2021 0444   HCO3 20.6 03/11/2021 0444   ACIDBASEDEF 5.1 (H) 03/11/2021 0444   O2SAT 98.1 03/11/2021 0444     Coagulation Profile: Recent Labs  Lab 03/11/2021 1051  INR 1.8*    Cardiac Enzymes: No results for input(s): CKTOTAL, CKMB, CKMBINDEX, TROPONINI in the last 168 hours.  HbA1C: Hemoglobin A1C  Date/Time Value  Ref Range Status  01/27/2014 05:31 AM 5.5 4.2 - 6.3 % Final    Comment:    The American Diabetes Association recommends that a primary goal of therapy should be <7% and that physicians should reevaluate the treatment regimen in patients with HbA1c values consistently >8%.    Hgb A1c MFr Bld  Date/Time Value Ref Range Status  03/06/2021 05:48 AM 5.7 (H) 4.8 - 5.6 % Final    Comment:    (NOTE) Pre diabetes:          5.7%-6.4%  Diabetes:              >6.4%  Glycemic control for   <7.0% adults with diabetes   11/08/2020 04:39 AM 5.6 4.8 - 5.6 % Final    Comment:    (NOTE) Pre diabetes:          5.7%-6.4%  Diabetes:              >6.4%  Glycemic control for   <7.0% adults with diabetes     CBG: Recent Labs  Lab 03/10/21 1542 03/10/21 1923 03/10/21 2313 03/11/21 0349 03/11/21 0722  GLUCAP 97 105* 128* 109* 112*    Review of Systems:   Unable to assess due to critical illness,  intubation & sedation   Allergies Allergies  Allergen Reactions  . Bupropion     Other reaction(s): Other (See Comments) Mood swings, angry Other reaction(s): Other (See Comments) Mood swings, angry     Scheduled Meds: . chlorhexidine gluconate (MEDLINE KIT)  15 mL Mouth Rinse BID  . Chlorhexidine Gluconate Cloth  6 each Topical Q0600  . docusate  100 mg Per Tube BID  . DULoxetine  120 mg Oral Daily  . epoetin (EPOGEN/PROCRIT) injection  4,000 Units Intravenous Q M,W,F-HD  . ezetimibe  10 mg Per Tube Daily  . insulin aspart  0-9 Units Subcutaneous Q4H  . mouth rinse  15 mL Mouth Rinse 10 times per day  . midodrine  10 mg Per Tube Q M,W,F-HD  . mirtazapine  45 mg Per Tube QHS  . pantoprazole  40 mg Oral BID  . polyethylene glycol  17 g Per Tube Daily  . rOPINIRole  4 mg Per Tube BID AC  . rosuvastatin  10 mg Per Tube QHS  . sevelamer carbonate  1,600 mg Per Tube TID WC  . sodium chloride flush  3 mL Intravenous Q12H  . sucralfate  1 g Per Tube TID   Continuous Infusions: . sodium chloride Stopped (02/28/2021 0909)  . amiodarone 30 mg/hr (03/11/21 0832)  . cefTRIAXone (ROCEPHIN)  IV Stopped (03/11/21 0805)  . fentaNYL infusion INTRAVENOUS 200 mcg/hr (03/11/21 0832)  . heparin 2,000 Units/hr (03/11/21 4536)  . norepinephrine (LEVOPHED) Adult infusion 2 mcg/min (02/25/2021 0443)  . phenylephrine (NEO-SYNEPHRINE) Adult infusion 210 mcg/min (03/11/21 0832)  . propofol (DIPRIVAN) infusion 15 mcg/kg/min (03/11/21 0832)  . vasopressin 0.03 Units/min (03/11/21 0832)   PRN Meds:.acetaminophen, ALPRAZolam, benzonatate, clonazepam, guaiFENesin, hydrocortisone cream, lidocaine HCl (PF), metoprolol tartrate    Darel Hong, AGACNP-BC [facility] Pager: [phone]

## 2021-03-11 NOTE — Plan of Care (Signed)
  Problem: Clinical Measurements: Goal: Ability to maintain clinical measurements within normal limits will improve Outcome: Progressing Goal: Respiratory complications will improve Outcome: Progressing   Problem: Elimination: Goal: Will not experience complications related to bowel motility Outcome: Progressing Goal: Will not experience complications related to urinary retention Outcome: Progressing   Problem: Pain Managment: Goal: General experience of comfort will improve Outcome: Progressing   Problem: Safety: Goal: Ability to remain free from injury will improve Outcome: Progressing   Problem: Skin Integrity: Goal: Risk for impaired skin integrity will decrease Outcome: Progressing

## 2021-03-12 ENCOUNTER — Inpatient Hospital Stay: Payer: Medicare Other

## 2021-03-12 DIAGNOSIS — Z515 Encounter for palliative care: Secondary | ICD-10-CM

## 2021-03-12 DIAGNOSIS — I4891 Unspecified atrial fibrillation: Secondary | ICD-10-CM | POA: Diagnosis not present

## 2021-03-12 DIAGNOSIS — Z9689 Presence of other specified functional implants: Secondary | ICD-10-CM

## 2021-03-12 DIAGNOSIS — N186 End stage renal disease: Secondary | ICD-10-CM | POA: Diagnosis not present

## 2021-03-12 DIAGNOSIS — Z992 Dependence on renal dialysis: Secondary | ICD-10-CM

## 2021-03-12 DIAGNOSIS — J9621 Acute and chronic respiratory failure with hypoxia: Secondary | ICD-10-CM | POA: Diagnosis not present

## 2021-03-12 DIAGNOSIS — Z7189 Other specified counseling: Secondary | ICD-10-CM | POA: Diagnosis not present

## 2021-03-12 DIAGNOSIS — R531 Weakness: Secondary | ICD-10-CM

## 2021-03-12 LAB — RENAL FUNCTION PANEL
Albumin: 2.5 g/dL — ABNORMAL LOW (ref 3.5–5.0)
Anion gap: 14 (ref 5–15)
BUN: 34 mg/dL — ABNORMAL HIGH (ref 8–23)
CO2: 21 mmol/L — ABNORMAL LOW (ref 22–32)
Calcium: 8.4 mg/dL — ABNORMAL LOW (ref 8.9–10.3)
Chloride: 102 mmol/L (ref 98–111)
Creatinine, Ser: 4.57 mg/dL — ABNORMAL HIGH (ref 0.61–1.24)
GFR, Estimated: 13 mL/min — ABNORMAL LOW (ref >60)
Glucose, Bld: 117 mg/dL — ABNORMAL HIGH (ref 70–99)
Phosphorus: 4.3 mg/dL (ref 2.5–4.6)
Potassium: 3.5 mmol/L (ref 3.5–5.1)
Sodium: 137 mmol/L (ref 135–145)

## 2021-03-12 LAB — RHEUMATOID FACTOR: Rheumatoid fact SerPl-aCnc: 12.3 IU/mL (ref <14.0)

## 2021-03-12 LAB — CULTURE, BAL-QUANTITATIVE W GRAM STAIN: Culture: <10000 — AB

## 2021-03-12 LAB — CBC
HCT: 30.4 % — ABNORMAL LOW (ref 39.0–52.0)
Hemoglobin: 9.5 g/dL — ABNORMAL LOW (ref 13.0–17.0)
MCH: 29.6 pg (ref 26.0–34.0)
MCHC: 31.3 g/dL (ref 30.0–36.0)
MCV: 94.7 fL (ref 80.0–100.0)
Platelets: 228 10*3/uL (ref 150–400)
RBC: 3.21 MIL/uL — ABNORMAL LOW (ref 4.22–5.81)
RDW: 15.9 % — ABNORMAL HIGH (ref 11.5–15.5)
WBC: 5.8 10*3/uL (ref 4.0–10.5)
nRBC: 0.3 % — ABNORMAL HIGH (ref 0.0–0.2)

## 2021-03-12 LAB — ANA W/REFLEX IF POSITIVE: Anti Nuclear Antibody (ANA): NEGATIVE

## 2021-03-12 LAB — GLUCOSE, CAPILLARY
Glucose-Capillary: 120 mg/dL — ABNORMAL HIGH (ref 70–99)
Glucose-Capillary: 103 mg/dL — ABNORMAL HIGH (ref 70–99)

## 2021-03-12 LAB — PHOSPHORUS: Phosphorus: 4.1 mg/dL (ref 2.5–4.6)

## 2021-03-12 LAB — HEPARIN LEVEL (UNFRACTIONATED): Heparin Unfractionated: 0.38 IU/mL (ref 0.30–0.70)

## 2021-03-12 LAB — MAGNESIUM: Magnesium: 1.8 mg/dL (ref 1.7–2.4)

## 2021-03-12 MED ORDER — GLYCOPYRROLATE 0.2 MG/ML IJ SOLN: 0.2000 mg | INTRAMUSCULAR | Status: DC | PRN

## 2021-03-12 MED ORDER — LORAZEPAM 2 MG/ML IJ SOLN: 1.0000 mg | INTRAMUSCULAR | Status: DC | PRN

## 2021-03-12 MED ORDER — HALOPERIDOL 0.5 MG PO TABS
0.5000 mg | ORAL_TABLET | ORAL | Status: DC | PRN
  Filled 2021-03-12: qty 1

## 2021-03-12 MED ORDER — GLYCOPYRROLATE 1 MG PO TABS
1.0000 mg | ORAL_TABLET | ORAL | Status: DC | PRN
  Filled 2021-03-12: qty 1

## 2021-03-12 MED ORDER — ACETAMINOPHEN 325 MG PO TABS: 650.0000 mg | ORAL_TABLET | Freq: Four times a day (QID) | ORAL | Status: DC | PRN

## 2021-03-12 MED ORDER — LORAZEPAM 2 MG/ML PO CONC
1.0000 mg | ORAL | Status: DC | PRN
  Filled 2021-03-12: qty 0.5

## 2021-03-12 MED ORDER — SCOPOLAMINE 1 MG/3DAYS TD PT72
1.0000 | MEDICATED_PATCH | TRANSDERMAL | Status: DC
  Administered 2021-03-12: 1.5 mg via TRANSDERMAL
  Filled 2021-03-12: qty 1

## 2021-03-12 MED ORDER — ONDANSETRON 4 MG PO TBDP
4.0000 mg | ORAL_TABLET | Freq: Four times a day (QID) | ORAL | Status: DC | PRN
  Filled 2021-03-12: qty 1

## 2021-03-12 MED ORDER — HALOPERIDOL LACTATE 2 MG/ML PO CONC
0.5000 mg | ORAL | Status: DC | PRN
  Filled 2021-03-12: qty 0.3

## 2021-03-12 MED ORDER — ONDANSETRON HCL 4 MG/2ML IJ SOLN: 4.0000 mg | Freq: Four times a day (QID) | INTRAMUSCULAR | Status: DC | PRN

## 2021-03-12 MED ORDER — BIOTENE DRY MOUTH MT LIQD: 15.0000 mL | OROMUCOSAL | Status: DC | PRN

## 2021-03-12 MED ORDER — HALOPERIDOL LACTATE 5 MG/ML IJ SOLN: 0.5000 mg | INTRAMUSCULAR | Status: DC | PRN

## 2021-03-12 MED ORDER — ACETAMINOPHEN 650 MG RE SUPP: 650.0000 mg | Freq: Four times a day (QID) | RECTAL | Status: DC | PRN

## 2021-03-12 MED ORDER — POLYVINYL ALCOHOL 1.4 % OP SOLN
1.0000 [drp] | Freq: Four times a day (QID) | OPHTHALMIC | Status: DC | PRN
  Filled 2021-03-12: qty 15

## 2021-03-12 MED ORDER — LORAZEPAM 1 MG PO TABS: 1.0000 mg | ORAL_TABLET | ORAL | Status: DC | PRN

## 2021-03-13 LAB — MPO/PR-3 (ANCA) ANTIBODIES
ANCA Proteinase 3: <3.5 U/mL (ref 0.0–3.5)
Myeloperoxidase Abs: <9 U/mL (ref 0.0–9.0)

## 2021-03-14 ENCOUNTER — Encounter: Payer: Medicare Other | Admitting: Student in an Organized Health Care Education/Training Program

## 2021-03-15 LAB — BODY FLUID CULTURE W GRAM STAIN

## 2021-03-15 NOTE — Progress Notes (Signed)
Per CDS [name] patient will be a candidate for tissue only.

## 2021-03-15 NOTE — Progress Notes (Signed)
NAME:  [name], MRN:  [id number], DOB:  17-Sep-1946, LOS: 7 ADMISSION DATE:  02/20/2021, CONSULTATION DATE: 03/06/2021 REFERRING MD: Sharion Settler, NP, CHIEF COMPLAINT: Hypotension    History of Present Illness:  This is a 75 yo male who presented to [facility] ER via EMS on 03/22 from home via EMS with c/o shortness of breath, fatigue, and weakness onset several weeks priors to presentation.  He has a hx of ESRD requiring HD M-W-F, last HD session 03/21, however unable to complete the full session due to hypotension. At baseline he voids some although in the last few days he has been unable to void. He reports he has had weakness and shortness of breath after he had a clogged dialysis catheter that required catheter lysis on 03/9.  He subsequently presented to the ER on 03/10 with chest pain.  CTA Chest performed which was negative for PE although dilated pulmonary arteries were noted along with a small pericardial effusion, and 3-vessel coronary atherosclerosis.  During that presentation bp marginal spb 90's.  ER physician discussed the case with on call Nephrologist Dr. Harvest Dark who advised he would monitor the pt closely during his scheduled HD session the next day, and pt did not require hospital admission.  ED course Upon arrival to the ER EKG revealed atrial flutter vs. atrial fibrillation with rvr.  Pt also hypotensive and diaphoretic.  He subsequently received low dose etomidate and underwent synchronized cardioversion which was successful cardiac rhythm sinus rhythm.  Pts hypotension improved post cardioversion.  CXR concerning for pericardial effusion, left pleural effusion, and left lower lobe airspace disease. UA concerning for UTI. Abx therapy initiated. ER physician consulted cardiology and nephrology. Hospitalist team admitted pt to the medsurg unit for additional workup and treatment.   Hospital course On 03/23 pt required transfer to the stepdown unit with wide complex  tachycardia hr. 140-150's and hypotension sbp 60-70's.  He required amiodarone bolus followed by continuous gtt and peripheral levophed.  PCCM team consulted to assist with management.    Pertinent  Medical History  OSA Renal Cell Carcinoma HTN HLD Type II Diabetes Mellitus Depression COPD Chronic Back Pain ESRD HD M-W-F  Significant Hospital Events: Including procedures, antibiotic start and stop dates in addition to other pertinent events   03/22: Pt presented to [facility] ER with weakness and shortness of breath.  Found to be hypotensive and cardiac rhythm atrial fibrillation with rvr.  Underwent successful cardioversion with improvement in bp 03/23: Pt admitted to the medsurg unit per hospitalist team for additional workup and treatment 03/23: Pt required transfer to the stepdown unit with wide complex tachycardia and hypotension requiring levophed and amiodarone gtts. PCCM team consulted to assist with management 03/27: Has complete atelectasis of the left lung, required intubation due to respiratory failure with hypercapnia.  Persistent A. fib flutter, CT chest ordered. Left Chest tube placed for large L Pleural Effusion 03/28: Critically ill, requiring Neo and Vasopressin.  Initial analysis of Pleural fluid consistent with Transudate, Cultures pending (both Pleural fluid and Bronchial alveolar lavage),Smear review of Pleural fluid is NEGATIVE for Malignancy;  to be started to CRRT, talked with daughter, palliative care team consulted 3/29 remains on CRRT, on vent, pressors  Interim History / Subjective:  Remains intubated Remains on vent On pressors On CRRT +multiorgan failure    Objective   Blood pressure 135/75, pulse 77, temperature 100.04 F (37.8 C), resp. rate 20, height 6' 0.01" (1.829 m), weight 120.3 kg, SpO2 98 %.  Vent Mode: PRVC FiO2 (%):  [35 %-40 %] 35 % Set Rate:  [20 bmp] 20 bmp Vt Set:  [550 mL] 550 mL PEEP:  [5 cmH20] 5 cmH20 Plateau Pressure:  [21 cmH20]  21 cmH20   Intake/Output Summary (Last 24 hours) at Mar 20, 2021 0734 Last data filed at 2021-03-20 0700 Gross per 24 hour  Intake 3276.72 ml  Output 2712 ml  Net 564.72 ml   Filed Weights   03/10/21 0500 03/11/21 0258 March 20, 2021 0500  Weight: 123.1 kg 123.3 kg 120.3 kg    REVIEW OF SYSTEMS  PATIENT IS UNABLE TO PROVIDE COMPLETE REVIEW OF SYSTEM S DUE TO SEVERE CRITICAL ILLNESS AND TOXIC METABOLIC ENCEPHALOPATHY    PHYSICAL EXAMINATION:  GENERAL:critically ill appearing, +resp distress HEAD: Normocephalic, atraumatic.  EYES: Pupils equal, round, reactive to light.  No scleral icterus.  MOUTH: Moist mucosal membrane. NECK: Supple. No thyromegaly. No nodules. No JVD.  PULMONARY: +rhonchi, +wheezing CARDIOVASCULAR: S1 and S2. Regular rate and rhythm. No murmurs, rubs, or gallops.  GASTROINTESTINAL: Soft, nontender, -distended. Positive bowel sounds.  MUSCULOSKELETAL: +edema.  NEUROLOGIC: obtunded SKIN:intact,warm,dry   Labs/imaging that I havepersonally reviewed   Bicarb 21, AG 16, Glucose 112, BUN 61, Cr. 8.18, WBC 7.6, RBC 3, Hgb 8.7; ABG: pH 7.32/pCO2 40/pO2 114/Bicarb 20.6 3/27: CT Chest>>1. There is a new, large left pleural effusion and total atelectasis of the left lung. The left lower lobe airways are fluid-filled, suggesting aspiration. 2. There is a new, moderate right pleural effusion, with essentially complete atelectasis of the right lower lobe. 3. Moderate pericardial effusion, increased in size compared to prior examination. 4. Support apparatus including endotracheal tube appears appropriately positioned. 5. Cardiomegaly and coronary artery disease. 3/28: CXR>>Stable chest radiograph with left chest tube in place. No visible left pleural effusion. Similar layering small right pleural effusion and dependent bibasilar airspace opacities.     Resolved Hospital Problem list   N/A  Assessment & Plan:   75 yo morbidly obese  white male with progressive  cardio-renal syndrome with generalized anasarca with severe hypoxic resp failure from pericardial and pleural effusions +lung atalectasis   Severe ACUTE Hypoxic and Hypercapnic Respiratory Failure -continue Mechanical Ventilator support -continue Bronchodilator Therapy -Wean Fio2 and PEEP as tolerated -VAP/VENT bundle implementation -will perform SAT/SBT when respiratory parameters are met -S/p Left Chest tube placement on 03/10/21 -Initial analysis of Pleural Fluid is consistent with Transudate [Lights Criteria: Pleural Protein 3.1 / Serum Protein 6.6 = 0.46;  Pleural LDH is 165 (which is <2/3 UNL of serum LDH)] -Smear review of Pleural fluid is NEGATIVE for Malignancy -Cultures still pending (both Pleural fluid and Bronchial alveolar lavage)  ACUTE SYSTOLIC CARDIAC FAILURE-due to Wide complex tachycardia  Atrial fibrillation with rvr s/p synchronized cardioversion 03/22 A. Fib/flutter recurred -oxygen as needed -Lasix as tolerated -follow up cardiac enzymes as indicated -follow up cardiology recs -Continue Midodrine -S/p Pericardiocentesis -Repeat Echocardiogram pending to check on Pericardial effusion -Cardiology following, appreciate input -Heparin & Amiodarone gtt's  ESRD ON HD /Renal Failure -continue Foley Catheter-assess need -Avoid nephrotoxic agents -Follow urine output, BMP -Ensure adequate renal perfusion, optimize oxygenation -Renal dose medications CRRT for now  INFECTIOUS DISEASE -continue antibiotics as prescribed -follow up cultures  Anemia of Chronic Disease -Monitor for S/Sx of bleeding -Trend CBC -Heparin gtt for Anticoagulation/VTE Prophylaxis  -Transfuse for Hgb <7  ENDO - ICU hypoglycemic\Hyperglycemia protocol -check FSBS per protocol diabetes mellitus  -CBG's -SSI -Follow ICU Hypo/Hyperglycemia protocol     Best practice (evaluated daily)  Diet:  NPO  Pain/Anxiety/Delirium protocol (if indicated): Fentanyl gtt VAP protocol (if  indicated): Yes, implemented DVT prophylaxis: Systemic AC GI prophylaxis: PPI Glucose control:  SSI No Central venous access:  Yes, still indicated Arterial line:  N/A Foley:  Yes, still indicated Mobility:  bed rest  PT consulted: N/A Last date of multidisciplinary goals of care discussion: 03/11/2021 Code Status:  full code Disposition: ICU  Labs   CBC: Recent Labs  Lab 02/28/2021 1325 03/06/21 0548 03/06/21 2101 02/20/2021 0420 03/08/21 7846 03/09/21 0543 03/10/21 0443 03/11/21 0458 2021/04/04 0438  WBC 8.1 9.2 7.4 9.8 8.7 7.7 9.0 7.6 5.8  NEUTROABS 6.7 7.4 5.9 8.6*  --   --   --   --   --   HGB 9.3* 9.3* 9.4* 9.3* 9.4* 9.0* 9.5* 8.7* 9.5*  HCT 30.2* 30.8* 31.2* 31.2* 31.0* 29.9* 31.8* 28.7* 30.4*  MCV 95.3 97.5 97.5 98.1 96.9 97.1 96.1 94.4 94.7  PLT 300 295 279 342 286 261 261 238 962    Basic Metabolic Panel: Recent Labs  Lab 03/08/21 0632 03/09/21 0543 03/09/21 1051 03/10/21 0443 03/11/21 0458 03/11/21 1604 04-04-2021 0438  NA 136 137 136 135 136 136 137  K 3.6 3.6 3.5 3.9 3.9 3.9 3.5  CL 96* 99 98 98 99 101 102  CO2 _0 21* 21* 21*  GLUCOSE 158* 138* 154* 149* 112* 119* 117*  BUN 69* 47* 49* 52* 61* 50* 34*  CREATININE 7.29* 6.00* 6.22* 7.24* 8.18* 6.58* 4.57*  CALCIUM 6.6* 7.1* 7.3* 8.1* 7.5* 7.9* 8.4*  MG 2.1 2.0  --  2.2 2.0  --  1.8  PHOS 8.7* 5.5*  --  7.4* 7.4* 5.8* 4.1  4.3   GFR: Estimated Creatinine Clearance: 19.6 mL/min (A) (by C-G formula based on SCr of 4.57 mg/dL (H)). Recent Labs  Lab 03/06/21 2101 03/06/21 2324 03/08/2021 0420 03/08/21 9528 03/09/21 0543 03/10/21 0443 03/11/21 0458 03/11/21 1045 03/11/21 1335 04-04-21 0438  PROCALCITON 1.05  --  1.01 1.56  --   --   --   --   --   --   WBC 7.4  --  9.8 8.7 7.7 9.0 7.6  --   --  5.8  LATICACIDVEN 2.2* 1.6  --   --   --   --   --  1.3 1.2  --     Liver Function Tests: Recent Labs  Lab 02/17/2021 1325 03/06/21 0548 03/08/2021 0420 03/09/21 1051 03/10/21 0443  03/11/21 1604 04/04/21 0438  AST 142* 74* 104* 49* 32  --   --   ALT 110* 98* 128* 103* 85*  --   --   ALKPHOS 88 83 91 77 77  --   --   BILITOT 0.9 0.9 0.9 0.7 0.8  --   --   PROT 6.4* 6.6 6.6 6.3* 6.6  --   --   ALBUMIN 3.0* 3.1* 3.3* 2.8* 2.8* 2.5* 2.5*   No results for input(s): LIPASE, AMYLASE in the last 168 hours. No results for input(s): AMMONIA in the last 168 hours.  ABG    Component Value Date/Time   PHART 7.32 (L) 03/11/2021 0444   PCO2ART 40 03/11/2021 0444   PO2ART 114 (H) 03/11/2021 0444   HCO3 20.6 03/11/2021 0444   ACIDBASEDEF 5.1 (H) 03/11/2021 0444   O2SAT 98.1 03/11/2021 0444     Coagulation Profile: Recent Labs  Lab 02/15/2021 1051  INR 1.8*    Cardiac Enzymes: No results for input(s): CKTOTAL, CKMB, CKMBINDEX, TROPONINI in the  last 168 hours.  HbA1C: Hemoglobin A1C  Date/Time Value Ref Range Status  01/27/2014 05:31 AM 5.5 4.2 - 6.3 % Final    Comment:    The American Diabetes Association recommends that a primary goal of therapy should be <7% and that physicians should reevaluate the treatment regimen in patients with HbA1c values consistently >8%.    Hgb A1c MFr Bld  Date/Time Value Ref Range Status  03/06/2021 05:48 AM 5.7 (H) 4.8 - 5.6 % Final    Comment:    (NOTE) Pre diabetes:          5.7%-6.4%  Diabetes:              >6.4%  Glycemic control for   <7.0% adults with diabetes   11/08/2020 04:39 AM 5.6 4.8 - 5.6 % Final    Comment:    (NOTE) Pre diabetes:          5.7%-6.4%  Diabetes:              >6.4%  Glycemic control for   <7.0% adults with diabetes     CBG: Recent Labs  Lab 03/11/21 1637 03/11/21 1947 03/11/21 2357 04-01-2021 0406 01-Apr-2021 0727  GLUCAP 114* 116* 114* 120* 103*    Review of Systems:   Unable to assess due to critical illness, intubation & sedation   Allergies Allergies  Allergen Reactions  . Bupropion     Other reaction(s): Other (See Comments) Mood swings, angry Other reaction(s):  Other (See Comments) Mood swings, angry     Scheduled Meds: . chlorhexidine gluconate (MEDLINE KIT)  15 mL Mouth Rinse BID  . Chlorhexidine Gluconate Cloth  6 each Topical Daily  . docusate  100 mg Per Tube BID  . DULoxetine  120 mg Oral Daily  . epoetin (EPOGEN/PROCRIT) injection  4,000 Units Intravenous Q M,W,F-HD  . ezetimibe  10 mg Per Tube Daily  . insulin aspart  0-9 Units Subcutaneous Q4H  . mouth rinse  15 mL Mouth Rinse 10 times per day  . midodrine  10 mg Per Tube TID  . mirtazapine  45 mg Per Tube QHS  . pantoprazole sodium  40 mg Per Tube BID  . polyethylene glycol  17 g Per Tube Daily  . rOPINIRole  4 mg Per Tube BID AC  . rosuvastatin  10 mg Per Tube QHS  . sodium chloride flush  3 mL Intravenous Q12H  . sucralfate  1 g Per Tube TID   Continuous Infusions: . sodium chloride 10 mL/hr at 04/01/21 0600  . amiodarone 30 mg/hr (04-01-21 0600)  . fentaNYL infusion INTRAVENOUS 250 mcg/hr (April 01, 2021 0600)  . heparin 2,000 Units/hr (04/01/2021 0600)  . phenylephrine (NEO-SYNEPHRINE) Adult infusion 200 mcg/min (April 01, 2021 0600)  . prismasol BGK 2/2.5 dialysis solution 2,000 mL/hr at 03/11/21 1807  . prismasol BGK 2/2.5 replacement solution 500 mL/hr at 03/11/21 1015  . prismasol BGK 2/2.5 replacement solution 500 mL/hr at 03/11/21 1015  . propofol (DIPRIVAN) infusion 20 mcg/kg/min (04-01-2021 0600)  . vasopressin 0.03 Units/min (04/01/2021 0600)   PRN Meds:.acetaminophen, ALPRAZolam, benzonatate, clonazepam, guaiFENesin, heparin, hydrocortisone cream, lidocaine HCl (PF), metoprolol tartrate, sodium chloride   DVT/GI PRX ordered and assessed TRANSFUSIONS AS NEEDED MONITOR FSBS I Assessed the need for Labs I Assessed the need for Foley I Assessed the need for Central Venous Line Family Discussion when available I Assessed the need for Mobilization I made an Assessment of medications to be adjusted accordingly Safety Risk assessment completed  CASE DISCUSSED IN  MULTIDISCIPLINARY  ROUNDS WITH ICU TEAM     Critical Care Time devoted to patient care services described in this note is 56 minutes.   Overall, patient is critically ill, prognosis is guarded.  Patient with Multiorgan failure and at high risk for cardiac arrest and death.    Corrin Parker, M.D.  [facility]  Medical Director [place] Director [facility] Cardio-Pulmonary Department

## 2021-03-15 NOTE — TOC Transition Note (Signed)
Transition of Care [facility]) - Progression Note    Patient Details  Name: [name] MRN: [id number] Date of Birth: 30-Aug-1946  Transition of Care [facility]) CM/SW [place], [place] Phone Number: [phone] 2021-03-21, 3:12 PM  Clinical Narrative:     Patient deceased, time of death 12:53PM.       Expected Discharge Plan and Services                                                 Social Determinants of Health (SDOH) Interventions    Readmission Risk Interventions Readmission Risk Prevention Plan 11/09/2020 10/08/2020 07/08/2019  Transportation Screening Complete Complete Complete  PCP or Specialist Appt within 3-5 Days - Complete Complete  HRI or [place] - Complete Complete  Social Work Consult for [place] Planning/Counseling - Complete Complete  Palliative Care Screening - Not Applicable Not Applicable  Medication Review Press photographer) Complete Complete Complete  PCP or Specialist appointment within 3-5 days of discharge Complete - -  [place] or Home Care Consult Complete - -  SW Recovery Care/Counseling Consult Complete - -  Palliative Care Screening Complete - -  Skilled Nursing Facility Complete - -  Some recent data might be hidden

## 2021-03-15 NOTE — Progress Notes (Signed)
Daily Progress Note   Patient Name: [name]       Date: March 27, 2021 DOB: 1946/07/11  Age: 75 y.o. MRN#: [id number] Attending Physician: Flora Lipps, MD Primary Care Physician: Derinda Late, MD Admit Date: 02/18/2021  Reason for Consultation/Follow-up: Establishing goals of care  Subjective: Patient is intubated. Wife and daughter are at bedside and wish to speak privately. Many friends and family in the waiting room.   We discussed his diagnosis, prognosis, GOC, EOL wishes disposition and options.  Created space and opportunity for patient  to explore thoughts and feelings regarding current medical information.   A detailed discussion was had today regarding advanced directives.  Concepts specific to code status, artifical feeding and hydration, IV antibiotics and rehospitalization were discussed.  The difference between an aggressive medical intervention path and a comfort care path was discussed.  Values and goals of care important to patient and family were attempted to be elicited.  Discussed limitations of medical interventions to prolong quality of life in some situations and discussed the concept of human mortality.    Daughter and wife discuss his poor QOL. They discuss his SOB and weakness after dialysis at home. They discuss his previous hospitalizations. Daughter discusses how he has stated he cannot go on and is tired. They would like to liberate him from the ventilator and allow him to be comfortable for what time he has left on this earth. They understand given his BP support, and ventilator support, his prognosis is likely up to hours. They are clear regarding their visitation wishes.  I completed a MOST form today with wife (and daughter present at bedside)  and  the signed original was placed in the chart. A photocopy was placed in the chart to be scanned into EMR. The patient outlined their wishes for the following treatment decisions:  Cardiopulmonary Resuscitation: Do Not Attempt Resuscitation (DNR/No CPR)  Medical Interventions: Comfort Measures: Keep clean, warm, and dry. Use medication by any route, positioning, wound care, and other measures to relieve pain and suffering. Use oxygen, suction and manual treatment of airway obstruction as needed for comfort. Do not transfer to the hospital unless comfort needs cannot be met in current location.  Antibiotics: No antibiotics (use other measures to relieve symptoms)  IV Fluids: No IV fluids (provide other measures to ensure comfort)  Feeding Tube: No feeding tube    Remained at bedside following extubation. Patient appears comfortable with no distress noted.   Length of Stay: 7  Current Medications: Scheduled Meds:  . mouth rinse  15 mL Mouth Rinse 10 times per day  . scopolamine  1 patch Transdermal Q72H    Continuous Infusions: . fentaNYL infusion INTRAVENOUS 250 mcg/hr (03/25/2021 0900)    PRN Meds: antiseptic oral rinse, glycopyrrolate **OR** glycopyrrolate **OR** glycopyrrolate, haloperidol **OR** haloperidol **OR** haloperidol lactate, [DISCONTINUED] LORazepam **OR** LORazepam **OR** LORazepam, ondansetron **OR** ondansetron (ZOFRAN) IV, polyvinyl alcohol  Physical Exam Constitutional:      Comments: On ventilator.              Vital Signs: BP 112/64   Pulse 69   Temp 99.5 F (37.5 C)   Resp 20   Ht 6' 0.01" (1.829 m)   Wt 120.3 kg   SpO2 99%   BMI 35.96 kg/m  SpO2: SpO2: 99 % O2 Device: O2 Device: Ventilator O2 Flow Rate: O2 Flow Rate (L/min): 4 L/min  Intake/output summary:   Intake/Output Summary (Last 24 hours) at 2021/03/25 1113 Last data filed at 03-25-2021 0900 Gross per 24 hour  Intake 2912.97 ml  Output 3017 ml  Net -104.03 ml   LBM: Last BM Date:  03/10/21 Baseline Weight: Weight: 113.4 kg Most recent weight: Weight: 120.3 kg           Patient Active Problem List   Diagnosis Date Noted  . Atrial fibrillation with rapid ventricular response ([place]) 03/11/2021  . Pericardial effusion 03/11/2021  . Pleural effusion on left 03/11/2021  . Shock circulatory ([place]) 03/11/2021  . S/P chest tube placement ([place]) 03/11/2021  . On mechanically assisted ventilation ([place]) 03/11/2021  . Acute on chronic respiratory failure ([place]) 03/11/2021  . Type 2 diabetes mellitus ([place]) 03/11/2021  . Wide-complex tachycardia ([place]) 03/11/2021  . UTI (urinary tract infection) 02/23/2021  . ESRD on dialysis ([place]) 01/22/2021  . Acute CVA (cerebrovascular accident) ([place]) 11/07/2020  . Malnutrition of moderate degree 10/11/2020  . History of renal cell carcinoma 09/30/2020  . History of left nephrectomy 2004 09/30/2020  . AKI (acute kidney injury) ([place]) 09/30/2020  . Hypoglycemia 09/30/2020  . Pancreatic insufficiency 09/30/2020  . Chronic diarrhea 09/30/2020  . Metabolic acidosis 19/37/9024  . Hand numbness 04/28/2020  . CAP (community acquired pneumonia) 03/25/2020  . Acute on chronic respiratory failure with hypoxia ([place]) 03/25/2020  . Hyperlipidemia   . COPD (chronic obstructive pulmonary disease) ([place])   . Depression   . CKD stage 3 due to type 2 diabetes mellitus ([place])   . Cellulitis 01/10/2020  . Proteinuria 09/20/2019  . Hyperkalemia 07/05/2019  . Lumbar degenerative disc disease 05/11/2019  . Spinal stenosis, lumbar region, with neurogenic claudication 05/11/2019  . Ankylosis of lumbar spine 05/11/2019  . Neuroforaminal stenosis of lumbar spine (left, L5/S1) 05/11/2019  . Lumbar facet arthropathy 05/11/2019  . Chronic pain syndrome 05/11/2019  . Chronic obstructive pulmonary disease ([place]) 01/03/2019  . Atherosclerotic peripheral vascular disease ([place]) 09/01/2018  . Lymphedema 06/22/2018  . Iron deficiency anemia 05/01/2018  . Anemia in  chronic kidney disease 04/26/2018  . Varicose veins of leg with swelling, bilateral 04/09/2018  . SOB (shortness of breath) on exertion 03/17/2018  . Bilateral lower extremity edema 02/23/2018  . Lower extremity pain, bilateral 02/23/2018  . Essential hypertension 02/23/2018  . H/O deep venous thrombosis 01/23/2017  . Type II diabetes mellitus with renal manifestations ([place]) 06/19/2014  . Chronic kidney disease,  unspecified 06/19/2014  . Anxiety state 06/19/2014  . Other and unspecified hyperlipidemia 06/19/2014    Palliative Care Assessment & Plan    Recommendations/Plan: Comfort care with prognosis of up to hours.    Code Status:    Code Status Orders  (From admission, onward)         Start     Ordered   03-16-2021 1100  Do not attempt resuscitation (DNR)  Continuous       Question Answer Comment  In the event of cardiac or respiratory ARREST Do not call a "code blue"   In the event of cardiac or respiratory ARREST Do not perform Intubation, CPR, defibrillation or ACLS   In the event of cardiac or respiratory ARREST Use medication by any route, position, wound care, and other measures to relive pain and suffering. May use oxygen, suction and manual treatment of airway obstruction as needed for comfort.   Comments MOST form in chart.      03-16-2021 1059        Code Status History    Date Active Date Inactive Code Status Order ID Comments User Context   02/25/2021 1722 March 16, 2021 1059 Full Code [id number]  Kayleen Memos, DO ED   11/07/2020 2135 11/09/2020 2341 Full Code [id number]  Mansy, Arvella Merles, MD ED   09/30/2020 2317 10/12/2020 2233 Full Code [id number]  Athena Masse, MD ED   03/25/2020 1327 03/27/2020 2156 Full Code [id number]  Ivor Costa, MD ED   01/10/2020 1501 01/13/2020 1859 Full Code [id number]  Sidney Ace, MD ED   07/05/2019 1256 07/08/2019 1728 Full Code [id number]  Lang Snow, NP ED   Advance Care Planning Activity    Advance Directive  Documentation   Flowsheet Row Most Recent Value  Type of Advance Directive Healthcare Power of Attorney  Pre-existing out of facility DNR order (yellow form or pink MOST form) --  "MOST" Form in Place? --       Care plan was discussed with CCM MD and RN  Thank you for allowing the Palliative Medicine Team to assist in the care of this patient.   Time In: 9:20 Time Out: 10:30 Total Time 70 min Prolonged Time Billed  yes      Greater than 50%  of this time was spent counseling and coordinating care related to the above assessment and plan.  Asencion Gowda, NP  Please contact Palliative Medicine Team phone at [phone] for questions and concerns.

## 2021-03-15 NOTE — Consult Note (Signed)
Consultation Note Date: 03/26/2021   Patient Name: [name]  DOB: 1946-01-27  MRN: [id number]  Age / Sex: 75 y.o., male  PCP: Derinda Late, MD Referring Physician: Flora Lipps, MD  Reason for Consultation: Establishing goals of care  HPI/Patient Profile: This is a 75 yo male who presented to [facility] ER via EMS on 03/22 from home via EMS with c/o shortness of breath, fatigue, and weakness onset several weeks priors to presentation.  He has a hx of ESRD requiring HD M-W-F, last HD session 03/21, however unable to complete the full session due to hypotension. Clinical Assessment and Goals of Care: Patient is resting in bed on ventilator. Went to see wife who is a patient elsewhere in the hospital. She discusses that his QOL has been poor.   We discussed his diagnosis, prognosis, GOC, EOL wishes disposition and options.  Created space and opportunity for patient  to explore thoughts and feelings regarding current medical information. She states he has said multiple times " I don't know how much longer I can continue on" and "I don't think I can take it." She states she tried to motivate him to continue on. She states "it's one thing after another for him"    A detailed discussion was had today regarding advanced directives.  Concepts specific to code status, artifical feeding and hydration, IV antibiotics and rehospitalization were discussed.  The difference between an aggressive medical intervention path and a comfort care path was discussed.  Values and goals of care important to patient and family were attempted to be elicited.  Discussed limitations of medical interventions to prolong quality of life in some situations and discussed the concept of human mortality.  She states he does not want to continue on and desires to allow her husband to be comfortable and die with dignity. She is being  discharged herself today but will return tomorrow at 9:30 for extubation.     SUMMARY OF RECOMMENDATIONS   1 way extubation tomorrow at 9:30.         Primary Diagnoses: Present on Admission: . UTI (urinary tract infection) . COPD (chronic obstructive pulmonary disease) ([place])   I have reviewed the medical record, interviewed the patient and family, and examined the patient. The following aspects are pertinent.  Past Medical History:  Diagnosis Date  . Chronic back pain   . COPD (chronic obstructive pulmonary disease) ([place])   . Depression   . Diabetes mellitus without complication ([place])   . Hyperlipidemia   . Hypertension   . Renal cell carcinoma ([place])   . Sleep apnea    Social History   Socioeconomic History  . Marital status: Married    Spouse name: Jackelyn Poling  . Number of children: Not on file  . Years of education: Not on file  . Highest education level: Not on file  Occupational History  . Occupation: Retired    Comment: VP of [employer]  Tobacco Use  . Smoking status: Former Smoker    Packs/day: 1.00  Years: 30.00    Pack years: 30.00  . Smokeless tobacco: Current User    Types: Chew  Vaping Use  . Vaping Use: Never used  Substance and Sexual Activity  . Alcohol use: No  . Drug use: No  . Sexual activity: Yes  Other Topics Concern  . Not on file  Social History Narrative  . Not on file   Social Determinants of Health   Financial Resource Strain: Not on file  Food Insecurity: Not on file  Transportation Needs: Not on file  Physical Activity: Not on file  Stress: Not on file  Social Connections: Not on file   Family History  Problem Relation Age of Onset  . Diabetes Mother   . Cancer Mother   . Heart disease Father    Scheduled Meds: . chlorhexidine gluconate (MEDLINE KIT)  15 mL Mouth Rinse BID  . Chlorhexidine Gluconate Cloth  6 each Topical Daily  . docusate  100 mg Per Tube BID  . DULoxetine  120 mg Oral Daily  . epoetin (EPOGEN/PROCRIT)  injection  4,000 Units Intravenous Q M,W,F-HD  . ezetimibe  10 mg Per Tube Daily  . insulin aspart  0-9 Units Subcutaneous Q4H  . mouth rinse  15 mL Mouth Rinse 10 times per day  . midodrine  10 mg Per Tube TID  . mirtazapine  45 mg Per Tube QHS  . pantoprazole sodium  40 mg Per Tube BID  . polyethylene glycol  17 g Per Tube Daily  . rOPINIRole  4 mg Per Tube BID AC  . rosuvastatin  10 mg Per Tube QHS  . sodium chloride flush  3 mL Intravenous Q12H  . sucralfate  1 g Per Tube TID   Continuous Infusions: . sodium chloride 10 mL/hr at 03-17-21 0800  . amiodarone 30 mg/hr (March 17, 2021 0849)  . fentaNYL infusion INTRAVENOUS 250 mcg/hr (03/17/21 0800)  . heparin 2,000 Units/hr (03/17/21 0800)  . phenylephrine (NEO-SYNEPHRINE) Adult infusion 150 mcg/min (03/17/2021 0800)  . prismasol BGK 2/2.5 dialysis solution 2,000 mL/hr at 03/11/21 1807  . prismasol BGK 2/2.5 replacement solution 500 mL/hr at 03/11/21 1015  . prismasol BGK 2/2.5 replacement solution 500 mL/hr at 03/11/21 1015  . propofol (DIPRIVAN) infusion 20 mcg/kg/min (03/17/21 0848)  . vasopressin 0.03 Units/min (03/17/2021 0847)   PRN Meds:.acetaminophen, ALPRAZolam, benzonatate, clonazepam, guaiFENesin, heparin, hydrocortisone cream, lidocaine HCl (PF), metoprolol tartrate, sodium chloride Medications Prior to Admission:  Prior to Admission medications   Medication Sig Start Date End Date Taking? Authorizing Provider  apixaban (ELIQUIS) 2.5 MG TABS tablet Take 1 tablet (2.5 mg total) by mouth 2 (two) times daily. 10/12/20  Yes Swayze, Ava, DO  carvedilol (COREG) 6.25 MG tablet Take 6.25 mg by mouth 2 (two) times daily with a meal.   Yes [provider]  DULoxetine (CYMBALTA) 60 MG capsule Take 120 mg by mouth daily.  09/01/19  Yes [provider]  ezetimibe (ZETIA) 10 MG tablet Take by mouth. 11/22/20 11/22/21 Yes [provider]  glipiZIDE (GLUCOTROL XL) 5 MG 24 hr tablet Take 5 mg by mouth daily. 07/25/20  Yes  [provider]  mirtazapine (REMERON) 45 MG tablet Take 45 mg by mouth at bedtime.  07/05/20  Yes [provider]  multivitamin (RENA-VIT) TABS tablet Take 1 tablet by mouth at bedtime. 10/12/20  Yes Swayze, Ava, DO  pantoprazole (PROTONIX) 40 MG tablet Take 40 mg by mouth 2 (two) times daily.    Yes [provider]  rOPINIRole (REQUIP) 4  MG tablet Take 1 tablet (4 mg total) by mouth 2 (two) times daily before a meal. 10/12/20  Yes Swayze, Ava, DO  rosuvastatin (CRESTOR) 10 MG tablet Take 1 tablet (10 mg total) by mouth daily. 11/09/20  Yes Pokhrel, Laxman, MD  sucralfate (CARAFATE) 1 g tablet Take 1 g by mouth 3 (three) times daily.    Yes [provider]   Allergies  Allergen Reactions  . Bupropion     Other reaction(s): Other (See Comments) Mood swings, angry Other reaction(s): Other (See Comments) Mood swings, angry   Review of Systems  Unable to perform ROS   Physical Exam Constitutional:      Comments: On ventilator.      Vital Signs: BP 109/64   Pulse 72   Temp 99.86 F (37.7 C)   Resp 20   Ht 6' 0.01" (1.829 m)   Wt 120.3 kg   SpO2 98%   BMI 35.96 kg/m  Pain Scale: CPOT POSS *See Group Information*: 1-Acceptable,Awake and alert Pain Score: 0-No pain   SpO2: SpO2: 98 % O2 Device:SpO2: 98 % O2 Flow Rate: .O2 Flow Rate (L/min): 4 L/min  IO: Intake/output summary:   Intake/Output Summary (Last 24 hours) at 03-17-21 0914 Last data filed at Mar 17, 2021 0800 Gross per 24 hour  Intake 3078.11 ml  Output 2887 ml  Net 191.11 ml    LBM: Last BM Date: 03/10/21 Baseline Weight: Weight: 113.4 kg Most recent weight: Weight: 120.3 kg     Palliative Assessment/Data:     Time In: 4:10 Time Out: 5:00 Time Total: 50 min Greater than 50%  of this time was spent counseling and coordinating care related to the above assessment and plan.  Signed by: Asencion Gowda, NP   Please contact Palliative Medicine Team phone at [phone]  for questions and concerns.  For individual provider: See Shea Evans

## 2021-03-15 NOTE — Death Summary Note (Signed)
DEATH SUMMARY   Patient Details  Name: [name] MRN: [id number] DOB: August 29, 1946  Admission/Discharge Information   Admit Date:  Mar 27, 2021  Date of Death: Date of Death: 04-03-21  Time of Death: Time of Death: 02-16-1252  Length of Stay: 7  Referring Physician: Derinda Late, MD   Reason(s) for Hospitalization  RESP FAILURE, HYPOXIA, PLEURAL AND PERICARDIAL EFFUSIONS  Diagnoses  Preliminary cause of death: ISCHEMIC CARDIOMYOPATHY, DM, ESRD ON HD Secondary Diagnoses (including complications and co-morbidities):  Active Problems:   COPD (chronic obstructive pulmonary disease) (HCC)   ESRD on dialysis (HCC)   UTI (urinary tract infection)   Atrial fibrillation with rapid ventricular response (HCC)   Pericardial effusion   Pleural effusion on left   Shock circulatory (HCC)   S/P chest tube placement (HCC)   On mechanically assisted ventilation (HCC)   Acute on chronic respiratory failure ([place])   Type 2 diabetes mellitus ([name])   Wide-complex tachycardia [facility])   Brief Hospital Course (including significant findings, care, treatment, and services provided and events leading to death)  This is a 75 yo male who presented to [facility] ER via EMS on Mar 28, 2023 from home via EMS with c/o shortness of breath, fatigue, and weakness onset several weeks priors to presentation.  He has a hx of ESRD requiring HD M-W-F, last HD session 03/21, however unable to complete the full session due to hypotension. At baseline he voids some although in the last few days he has been unable to void. He reports he has had weakness and shortness of breath after he had a clogged dialysis catheter that required catheter lysis on 03/9.  He subsequently presented to the ER on 03/10 with chest pain.  CTA Chest performed which was negative for PE although dilated pulmonary arteries were noted along with a small pericardial effusion, and 3-vessel coronary atherosclerosis.  During that presentation bp marginal spb 90's.   ER physician discussed the case with on call Nephrologist Dr. Harvest Dark who advised he would monitor the pt closely during his scheduled HD session the next day, and pt did not require hospital admission.  ED course Upon arrival to the ER EKG revealed atrial flutter vs. atrial fibrillation with rvr.  Pt also hypotensive and diaphoretic.  He subsequently received low dose etomidate and underwent synchronized cardioversion which was successful cardiac rhythm sinus rhythm.  Pts hypotension improved post cardioversion.  CXR concerning for pericardial effusion, left pleural effusion, and left lower lobe airspace disease. UA concerning for UTI. Abx therapy initiated. ER physician consulted cardiology and nephrology. Hospitalist team admitted pt to the medsurg unit for additional workup and treatment.   Hospital course On 03/23 pt required transfer to the stepdown unit with wide complex tachycardia hr. 140-150's and hypotension sbp 60-70's.  He required amiodarone bolus followed by continuous gtt and peripheral levophed.  PCCM team consulted to assist with management.    Pertinent  Medical History  OSA Renal Cell Carcinoma HTN HLD Type II Diabetes Mellitus Depression COPD Chronic Back Pain ESRD HD M-W-F  Significant Hospital Events: Including procedures, antibiotic start and stop dates in addition to other pertinent events   28-Mar-2023: Pt presented to [facility] ER with weakness and shortness of breath.  Found to be hypotensive and cardiac rhythm atrial fibrillation with rvr.  Underwent successful cardioversion with improvement in bp 03/23: Pt admitted to the medsurg unit per hospitalist team for additional workup and treatment 03/23: Pt required transfer to the stepdown unit with wide complex tachycardia and hypotension requiring levophed  and amiodarone gtts. PCCM team consulted to assist with management 03/27: Has complete atelectasis of the left lung, required intubation due to respiratory failure with  hypercapnia.  Persistent A. fib flutter, CT chest ordered. Left Chest tube placed for large L Pleural Effusion 03/28: Critically ill, requiring Neo and Vasopressin.  Initial analysis of Pleural fluid consistent with Transudate, Cultures pending (both Pleural fluid and Bronchial alveolar lavage),Smear review of Pleural fluid is NEGATIVE for Malignancy;  to be started to CRRT, talked with daughter, palliative care team consulted 3/29 remains on CRRT, on vent, pressors  Interim History / Subjective:  Remains intubated Remains on vent On pressors On CRRT +multiorgan failure   GOALS OF CARE DISCUSSION  The Clinical status was relayed to family in detail.  Updated and notified of patients medical condition.  Patient remains unresponsive and will not open eyes to command.    Patient is having a weak cough and struggling to remove secretions.   Patient with increased WOB and using accessory muscles to breathe Explained to family course of therapy and the modalities     Patient with Progressive multiorgan failure with a very high probablity of a very minimal chance of meaningful recovery despite all aggressive and optimal medical therapy. Patient is in the Dying  Process associated with Suffering.  Family understands the situation.  They have consented and agreed to DNR/DNI and would like to proceed with Comfort care measures.  Family are satisfied with Plan of action and management. All questions answered   Patient was suffering and did NOT want to live on machines  Patient dies at 1253PM       Pertinent Labs and Studies  Significant Diagnostic Studies DG Chest 2 View  Result Date: 02/21/2021 CLINICAL DATA:  Chest pain EXAM: CHEST - 2 VIEW COMPARISON:  October 07, 2020 FINDINGS: Central catheter tip in superior vena cava near the cavoatrial junction. No pneumothorax. There is left base atelectasis. Lungs elsewhere are clear. Heart is slightly enlarged with pulmonary  vascularity normal. No adenopathy. No bone lesions. IMPRESSION: Central catheter tip in superior vena cava near the cavoatrial junction. No pneumothorax. Left base atelectasis. No edema or airspace opacity. Mild cardiac prominence. Electronically Signed   By: Lowella Grip III M.D.   On: 02/21/2021 13:49   DG Abd 1 View  Result Date: 03/10/2021 CLINICAL DATA:  OG tube placement. EXAM: ABDOMEN - 1 VIEW COMPARISON:  None. FINDINGS: An OG tube is noted with tip overlying the proximal-mid stomach. No other significant changes noted. IMPRESSION: OG tube with tip overlying the proximal-mid stomach. Electronically Signed   By: Margarette Canada M.D.   On: 03/10/2021 10:51   CT CHEST WO CONTRAST  Result Date: 03/10/2021 CLINICAL DATA:  Atelectasis EXAM: CT CHEST WITHOUT CONTRAST TECHNIQUE: Multidetector CT imaging of the chest was performed following the standard protocol without IV contrast. COMPARISON:  02/21/2021 FINDINGS: Cardiovascular: No significant vascular findings. Cardiomegaly. Three-vessel coronary artery calcifications. Moderate pericardial effusion, increased in size compared to prior examination Mediastinum/Nodes: No enlarged mediastinal, hilar, or axillary lymph nodes. Thyroid gland, trachea, and esophagus demonstrate no significant findings. Lungs/Pleura: Endotracheal intubation, tip within the lower trachea. There is a new, large left pleural effusion and total atelectasis of the left lung. The left lower lobe airways are fluid-filled. There is a new, moderate right pleural effusion, with essentially complete atelectasis of the right lower lobe. Upper Abdomen: No acute abnormality. Esophagogastric tube with tip and side port below the diaphragm. Musculoskeletal: No chest wall mass or suspicious  bone lesions identified. IMPRESSION: 1. There is a new, large left pleural effusion and total atelectasis of the left lung. The left lower lobe airways are fluid-filled, suggesting aspiration. 2. There is a  new, moderate right pleural effusion, with essentially complete atelectasis of the right lower lobe. 3. Moderate pericardial effusion, increased in size compared to prior examination. 4. Support apparatus including endotracheal tube appears appropriately positioned. 5. Cardiomegaly and coronary artery disease. Electronically Signed   By: Eddie Candle M.D.   On: 03/10/2021 10:56   CT Angio Chest PE W and/or Wo Contrast  Result Date: 02/21/2021 CLINICAL DATA:  Right upper chest pain post dialysis catheter flushing last night. EXAM: CT ANGIOGRAPHY CHEST WITH CONTRAST TECHNIQUE: Multidetector CT imaging of the chest was performed using the standard protocol during bolus administration of intravenous contrast. Multiplanar CT image reconstructions and MIPs were obtained to evaluate the vascular anatomy. CONTRAST:  62m OMNIPAQUE IOHEXOL 350 MG/ML SOLN COMPARISON:  Chest radiograph from earlier today. 01/28/2014 chest CT. FINDINGS: Cardiovascular: The study is high quality for the evaluation of pulmonary embolism. There are no filling defects in the central, lobar, segmental or subsegmental pulmonary artery branches to suggest acute pulmonary embolism. Normal course and caliber of the thoracic aorta. Dilated main pulmonary artery (3.8 cm diameter). Normal heart size. Small pericardial effusion. Three-vessel coronary atherosclerosis. Right internal jugular central venous catheter terminates in the lower third of the SVC. Mediastinum/Nodes: No discrete thyroid nodules. Unremarkable esophagus. No pathologically enlarged axillary, mediastinal or hilar lymph nodes. Lungs/Pleura: No pneumothorax. No pleural effusion. No acute consolidative airspace disease, lung masses or significant pulmonary nodules. Platelike parenchymal bands at the left greater than right lung bases compatible with scarring or atelectasis. 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 Musculoskeletal: No  aggressive appearing focal osseous lesions. Mild thoracic spondylosis. Review of the MIP images confirms the above findings. IMPRESSION: 1. No pulmonary embolism. 2. Dilated main pulmonary artery, suggesting pulmonary arterial hypertension. 3. Small pericardial effusion. 4. Three-vessel coronary atherosclerosis. 5. Platelike parenchymal bands at the left greater than right lung bases, compatible with scarring or atelectasis. Electronically Signed   By: JIlona SorrelM.D.   On: 02/21/2021 15:45   CARDIAC CATHETERIZATION  Result Date: 03/08/2021  Successful pericardiocentesis  Removal of 300 cc of bloody fluid  Drain was not left in place  Slight improvement in systolic blood pressure at the end of procedure  Conclusion Successful pericardiocentesis under ultrasound guidance subxiphoid approach 300 cc of bloody fluid was removed without difficulty No complications Patient tolerated procedure well   DG Chest Port 1 View  Result Date: 32022-04-28CLINICAL DATA:  Check endotracheal tube placement EXAM: PORTABLE CHEST 1 VIEW COMPARISON:  Film from the previous day. FINDINGS: Endotracheal tube, gastric catheter, esophageal probe and right jugular dialysis catheter are again seen and stable. Cardiac shadow remains enlarged. Bibasilar atelectatic changes and right-sided effusion are again seen and stable. No new focal abnormality is noted. IMPRESSION: No change from the prior exam. Electronically Signed   By: MInez CatalinaM.D.   On: 004-28-202201:[id number]   DG Chest Port 1 View  Result Date: 03/11/2021 CLINICAL DATA:  Follow-up chest tube in pleural effusions. EXAM: PORTABLE CHEST 1 VIEW COMPARISON:  Chest radiograph March 10, 2021 FINDINGS: Stable position of the left-sided chest 2. Endotracheal tube with tip overlying the midthoracic trachea. Esophageal temperature probe. Right approach central venous catheter with tip overlying the superior cavoatrial junction. Nasogastric tube coursing below the diaphragm with  tip obscured by  collimation. Stable cardiomegaly. Similar layering small right pleural effusion. No visible left pleural effusion. Similar dependent airspace opacity. No visible pneumothorax. The visualized skeletal structures are unchanged. IMPRESSION: Stable chest radiograph with left chest tube in place. No visible left pleural effusion. Similar layering small right pleural effusion and dependent bibasilar airspace opacities. Electronically Signed   By: Dahlia Bailiff MD   On: 03/11/2021 03:02   DG Chest Port 1 View  Result Date: 03/10/2021 CLINICAL DATA:  Status post chest tube placement EXAM: PORTABLE CHEST 1 VIEW COMPARISON:  03/10/2021 FINDINGS: Interval placement of left-sided chest tube with essentially complete resolution of previously seen left pleural effusion. No significant pneumothorax. Persistent, layering right pleural effusion. Cardiomegaly. Unchanged support apparatus the endotracheal tube, esophagogastric tube, and large-bore right neck multi lumen vascular catheter. IMPRESSION: 1. Interval placement of left-sided chest tube with essentially complete resolution of previously seen left pleural effusion. No significant pneumothorax. 2. Persistent, layering right pleural effusion. 3. Cardiomegaly. 4. Unchanged support apparatus. Electronically Signed   By: Eddie Candle M.D.   On: 03/10/2021 16:19   DG Chest Port 1 View  Result Date: 03/10/2021 CLINICAL DATA:  Endotracheal and esophagogastric tube placement EXAM: PORTABLE CHEST 1 VIEW COMPARISON:  03/10/2021, 6:12 a.m. FINDINGS: Interval placement of endotracheal tube, tip projecting over the mid trachea. Interval placement of esophagogastric gastric tube, tip and side port below the diaphragm. Otherwise unchanged examination with cardiomegaly and total opacification of the left hemithorax. Diffuse interstitial opacity and probable small layering pleural effusion on the right. Large-bore right neck multi lumen vascular catheter. IMPRESSION:  1. Interval placement of endotracheal tube, tip projecting over the mid trachea. 2. Interval placement of esophagogastric gastric tube, tip and side port below the diaphragm. 3. Otherwise unchanged examination with cardiomegaly and total opacification of the left hemithorax, as well as edema and probable small pleural effusion on the right. Electronically Signed   By: Eddie Candle M.D.   On: 03/10/2021 09:12   DG Chest Port 1 View  Result Date: 03/10/2021 CLINICAL DATA:  Shortness of breath, assess location of HD catheter EXAM: PORTABLE CHEST 1 VIEW COMPARISON:  03/08/2021 FINDINGS: Near complete opacification of the left hemithorax, with mild lucency/aeration in the central left upper lobe. This likely reflects mucous plugging given rapid change from prior, although a large left pleural effusion could also have this appearance. Right lung is essentially clear, noting a suspected trace pleural effusion. No pneumothorax. Cardiomegaly. Right chest dual lumen dialysis catheter is appropriately positioned in the cavoatrial junction. IMPRESSION: Near complete opacification of the left hemithorax, likely reflecting mucous plugging given rapid change from prior, although a large left pleural effusion could also have this appearance. Right chest dual lumen dialysis catheter positioned in the cavoatrial junction. Electronically Signed   By: Julian Hy M.D.   On: 03/10/2021 06:27   DG Chest Port 1 View  Result Date: 03/08/2021 CLINICAL DATA:  Shortness of breath. EXAM: PORTABLE CHEST 1 VIEW COMPARISON:  Radiograph March 07, 2021 FINDINGS: Right IJ central venous catheter tip projecting over the superior cavoatrial junction. Similar generalized cardiac enlargement. Pulmonary vascular congestion. Small left greater than right pleural effusions. Left basilar atelectasis. No focal consolidation or overt pulmonary edema. The visualized skeletal structures are unchanged. IMPRESSION: No significant interval change in  the enlarged cardiac silhouette and small pleural effusion. Similar bibasilar atelectasis. Electronically Signed   By: Dahlia Bailiff MD   On: 03/08/2021 21:08   DG Chest Port 1 View  Result Date: 02/26/2021 CLINICAL DATA:  Status post pericardiocentesis with chest discomfort EXAM: PORTABLE CHEST 1 VIEW COMPARISON:  March 07, 2021 study obtained earlier in the day. FINDINGS: Central catheter tip is in the superior vena cava near the cavoatrial junction, stable. No pneumothorax. There is a small left pleural effusion with left lower lobe atelectatic change. There is also atelectatic change in the medial right base. There is generalized cardiac enlargement with pulmonary vascularity within normal limits. No adenopathy. No bone lesions IMPRESSION: Persistent enlargement of the cardiac silhouette. There may well be underlying pericardial effusion. There is a small left pleural effusion. There is bibasilar atelectasis, similar to earlier in the day. No pneumothorax. Central catheter tip near cavoatrial junction in the superior vena cava. Electronically Signed   By: Lowella Grip III M.D.   On: 02/24/2021 16:23   DG Chest Port 1 View  Result Date: 02/14/2021 CLINICAL DATA:  Acute respiratory failure. EXAM: PORTABLE CHEST 1 VIEW COMPARISON:  03/06/2021.  03/14/2021.  CT 02/21/2021. FINDINGS: Dual-lumen catheter noted with tip over cavoatrial junction in stable position. Stable cardiomegaly and pulmonary vascular congestion. Low lung volumes with persistent left base atelectasis. Persistent small left pleural effusion. Elevation left hemidiaphragm is most likely again present. No pneumothorax. IMPRESSION: 1. Dual-lumen catheter in stable position. 2. Stable cardiomegaly and pulmonary vascular congestion. 3. Low lung volumes with persistent left base atelectasis and small left pleural effusion. Electronically Signed   By: Marcello Moores  Register   On: 02/14/2021 05:24   DG Chest Port 1 View  Result Date:  03/06/2021 CLINICAL DATA:  Tachycardia EXAM: PORTABLE CHEST 1 VIEW COMPARISON:  03/11/2021 FINDINGS: Right dialysis catheter remains in place, unchanged. Cardiomegaly with vascular congestion. Left lower lobe opacity, likely atelectasis. Possible small left effusion. Possible small effusions. No overt edema or acute bony abnormality. IMPRESSION: Cardiomegaly, vascular congestion. Small bilateral effusions. Left base atelectasis. Electronically Signed   By: Rolm Baptise M.D.   On: 03/06/2021 21:02   DG Chest Port 1 View  Result Date: 02/19/2021 CLINICAL DATA:  Short of breath and edema.  Missed dialysis. EXAM: PORTABLE CHEST 1 VIEW COMPARISON:  02/21/2021 FINDINGS: Marked cardiac enlargement with progression. Possible pericardial effusion. Vascular congestion. Negative for edema. Interval development of small to moderate left pleural effusion and left lower lobe atelectasis. Dialysis catheter in the lower SVC unchanged. IMPRESSION: Interval development marked cardiac enlargement suggesting pericardial effusion Interval development of left pleural effusion and left lower lobe airspace disease. Electronically Signed   By: Franchot Gallo M.D.   On: 02/12/2021 14:42   ECHOCARDIOGRAM COMPLETE  Result Date: 03/06/2021    ECHOCARDIOGRAM REPORT   Patient Name:   JANOS SHAMPINE Date of Exam: 03/06/2021 Medical Rec #:  [id number]             Height:       72.0 in Accession #:    [id number]            Weight:       259.5 lb Date of Birth:  Jan 10, 1946            BSA:          2.380 m Patient Age:    39 years              BP:           114/79 mmHg Patient Gender: M                     HR:  90 bpm. Exam Location:  [facility] Procedure: 2D Echo, Cardiac Doppler and Color Doppler Indications:     Pericardial effusion I31.3  History:         Patient has prior history of Echocardiogram examinations, most                  recent 11/08/2020. Risk Factors:Hypertension and Diabetes.  Sonographer:     Sherrie Sport RDCS (AE)  Referring Phys:  [id number] [place] Diagnosing Phys: Bartholome Bill MD  Sonographer Comments: Global longitudinal strain was attempted. IMPRESSIONS  1. Left ventricular ejection fraction, by estimation, is 60 to 65%. The left ventricle has normal function. The left ventricle has no regional wall motion abnormalities. Left ventricular diastolic parameters were normal.  2. Right ventricular systolic function is normal. The right ventricular size is mildly enlarged.  3. Moderate pericardial effusion. The pericardial effusion is circumferential. There is no evidence of cardiac tamponade.  4. The mitral valve is grossly normal. Trivial mitral valve regurgitation.  5. The aortic valve was not well visualized. Aortic valve regurgitation is trivial. FINDINGS  Left Ventricle: Left ventricular ejection fraction, by estimation, is 60 to 65%. The left ventricle has normal function. The left ventricle has no regional wall motion abnormalities. The left ventricular internal cavity size was normal in size. There is  borderline left ventricular hypertrophy. Left ventricular diastolic parameters were normal. Right Ventricle: The right ventricular size is mildly enlarged. No increase in right ventricular wall thickness. Right ventricular systolic function is normal. Left Atrium: Left atrial size was normal in size. Right Atrium: Right atrial size was normal in size. Pericardium: A moderately sized pericardial effusion is present. The pericardial effusion is circumferential. There is no evidence of cardiac tamponade. Mitral Valve: The mitral valve is grossly normal. Trivial mitral valve regurgitation. Tricuspid Valve: The tricuspid valve is grossly normal. Tricuspid valve regurgitation is mild. Aortic Valve: The aortic valve was not well visualized. Aortic valve regurgitation is trivial. Aortic valve mean gradient measures 2.0 mmHg. Aortic valve peak gradient measures 3.6 mmHg. Aortic valve area, by VTI measures 6.81 cm. Pulmonic  Valve: The pulmonic valve was not well visualized. Pulmonic valve regurgitation is trivial. Aorta: The aortic root is normal in size and structure. IAS/Shunts: The interatrial septum was not assessed.  LEFT VENTRICLE PLAX 2D LVIDd:         3.77 cm  Diastology LVIDs:         2.50 cm  LV e' medial:    4.46 cm/s LV PW:         1.61 cm  LV E/e' medial:  15.0 LV IVS:        1.28 cm  LV e' lateral:   5.77 cm/s LVOT diam:     2.20 cm  LV E/e' lateral: 11.6 LV SV:         85 LV SV Index:   36 LVOT Area:     3.80 cm  RIGHT VENTRICLE RV Basal diam:  3.60 cm RV S prime:     14.70 cm/s TAPSE (M-mode): 4.5 cm LEFT ATRIUM             Index       RIGHT ATRIUM           Index LA diam:        3.50 cm 1.47 cm/m  RA Area:     30.60 cm LA Vol (A2C):   90.1 ml 37.86 ml/m RA Volume:   106.00 ml 44.54 ml/m LA  Vol (A4C):   78.9 ml 33.15 ml/m LA Biplane Vol: 87.7 ml 36.85 ml/m  AORTIC VALVE                   PULMONIC VALVE AV Area (Vmax):    4.54 cm    PV Vmax:        0.57 m/s AV Area (Vmean):   4.31 cm    PV Peak grad:   1.3 mmHg AV Area (VTI):     6.81 cm    RVOT Peak grad: 2 mmHg AV Vmax:           94.70 cm/s AV Vmean:          59.200 cm/s AV VTI:            0.125 m AV Peak Grad:      3.6 mmHg AV Mean Grad:      2.0 mmHg LVOT Vmax:         113.00 cm/s LVOT Vmean:        67.100 cm/s LVOT VTI:          0.224 m LVOT/AV VTI ratio: 1.79  AORTA Ao Root diam: 3.43 cm MITRAL VALVE               TRICUSPID VALVE MV Area (PHT): 2.99 cm    TR Peak grad:   12.8 mmHg MV Decel Time: 254 msec    TR Vmax:        179.00 cm/s MV E velocity: 66.80 cm/s MV A velocity: 58.70 cm/s  SHUNTS MV E/A ratio:  1.14        Systemic VTI:  0.22 m                            Systemic Diam: 2.20 cm Bartholome Bill MD Electronically signed by Bartholome Bill MD Signature Date/Time: 03/06/2021/10:03:59 AM    Final    ECHOCARDIOGRAM LIMITED  Result Date: 03/11/2021    ECHOCARDIOGRAM LIMITED REPORT   Patient Name:   MAVERYK RENSTROM Fambrough Date of Exam: 03/10/2021 Medical  Rec #:  [id number]             Height:       72.0 in Accession #:    [id number]            Weight:       255.5 lb Date of Birth:  1946/01/13            BSA:          2.364 m Patient Age:    28 years              BP:           Not listed on flowsheet/Not                                                     listed on flowsheet mmHg Patient Gender: M                     HR:           Not listed in flowsheet bpm. Exam Location:  [facility] Procedure: Limited Echo, Cardiac Doppler and Color Doppler  MODIFIED REPORT:  This report was modified by Yolonda Kida MD on 02/14/2021 due to Improved                  pericardial effusion s/p pericardialcentisis.  Indications:     Pericardial Effusion I31.3  History:         Patient has prior history of Echocardiogram examinations, most                  recent 03/06/2021. COPD; Risk Factors:Hypertension.  Sonographer:     Sherrie Sport RDCS (AE) Referring Phys:  [id number] Teodoro Spray Diagnosing Phys: Yolonda Kida MD IMPRESSIONS  1. Improved pericardial effusion only mild from mod.  2. Left ventricular ejection fraction, by estimation, is 70 to 75%. Left ventricular ejection fraction by PLAX is 74 %. The left ventricle has hyperdynamic function. The left ventricle has no regional wall motion abnormalities. Left ventricular diastolic parameters were normal.  3. Right ventricular systolic function is normal. The right ventricular size is normal.  4. A small pericardial effusion is present.  5. The mitral valve is normal in structure. No evidence of mitral valve regurgitation.  6. The aortic valve is normal in structure. Aortic valve regurgitation is not visualized. FINDINGS  Left Ventricle: Left ventricular ejection fraction, by estimation, is 70 to 75%. Left ventricular ejection fraction by PLAX is 74 %. The left ventricle has hyperdynamic function. The left ventricle has no regional wall motion abnormalities. There is no left ventricular hypertrophy. Left  ventricular diastolic parameters were normal. Right Ventricle: The right ventricular size is normal. No increase in right ventricular wall thickness. Right ventricular systolic function is normal. Left Atrium: Left atrial size was not assessed. Right Atrium: Right atrial size was not assessed. Pericardium: A small pericardial effusion is present. Mitral Valve: The mitral valve is normal in structure. Tricuspid Valve: The tricuspid valve is normal in structure. Tricuspid valve regurgitation is not demonstrated. Aortic Valve: The aortic valve is normal in structure. Aortic valve regurgitation is not visualized. Pulmonic Valve: The pulmonic valve was grossly normal. Aorta: The ascending aorta was not well visualized. Additional Comments: Improved pericardial effusion only mild from mod. LEFT VENTRICLE PLAX 2D LV EF:         Left ventricular ejection fraction by PLAX is 74 %. LVIDd:         4.80 cm LVIDs:         2.74 cm LV PW:         1.68 cm LV IVS:        1.63 cm  LEFT ATRIUM         Index LA diam:    4.00 cm 1.69 cm/m                        PULMONIC VALVE AORTA                 PV Vmax:        0.60 m/s Ao Root diam: 3.50 cm PV Peak grad:   1.4 mmHg                       RVOT Peak grad: 2 mmHg  TRICUSPID VALVE TR Peak grad:   5.2 mmHg TR Vmax:        114.00 cm/s Yolonda Kida MD Electronically signed by Yolonda Kida MD Signature Date/Time: 02/21/2021/11:36:20 PM    Final (Updated)     Microbiology  Recent Results (from the past 240 hour(s))  Urine Culture     Status: None   Collection Time: 03/01/2021  2:51 PM   Specimen: Urine, Random  Result Value Ref Range Status   Specimen Description   Final    URINE, RANDOM Performed at [facility], [address] [place], [place] [address]    Special Requests   Final    NONE Performed at [facility], [address], [place], [place] [address]    Culture   Final    NO GROWTH Performed at [facility], [place] [address]  [place], [place] [address]    Report Status 02/27/2021 FINAL  Final  Resp Panel by RT-PCR (Flu A&B, Covid) Nasopharyngeal Swab     Status: None   Collection Time: 02/16/2021  3:45 PM   Specimen: Nasopharyngeal Swab; Nasopharyngeal(NP) swabs in vial transport medium  Result Value Ref Range Status   SARS Coronavirus 2 by RT PCR NEGATIVE NEGATIVE Final    Comment: (NOTE) SARS-CoV-2 target nucleic acids are NOT DETECTED.  The SARS-CoV-2 RNA is generally detectable in upper respiratory specimens during the acute phase of infection. The lowest concentration of SARS-CoV-2 viral copies this assay can detect is 138 copies/mL. A negative result does not preclude SARS-Cov-2 infection and should not be used as the sole basis for treatment or other patient management decisions. A negative result may occur with  improper specimen collection/handling, submission of specimen other than nasopharyngeal swab, presence of viral mutation(s) within the areas targeted by this assay, and inadequate number of viral copies(<138 copies/mL). A negative result must be combined with clinical observations, patient history, and epidemiological information. The expected result is Negative.  Fact Sheet for Patients:  EntrepreneurPulse.com.au  Fact Sheet for Healthcare Providers:  IncredibleEmployment.be  This test is no t yet approved or cleared by the [place] FDA and  has been authorized for detection and/or diagnosis of SARS-CoV-2 by FDA under an Emergency Use Authorization (EUA). This EUA will remain  in effect (meaning this test can be used) for the duration of the COVID-19 declaration under Section 564(b)(1) of the Act, 21 U.S.C.section 360bbb-3(b)(1), unless the authorization is terminated  or revoked sooner.       Influenza A by PCR NEGATIVE NEGATIVE Final   Influenza B by PCR NEGATIVE NEGATIVE Final    Comment: (NOTE) The Xpert Xpress SARS-CoV-2/FLU/RSV plus assay is  intended as an aid in the diagnosis of influenza from Nasopharyngeal swab specimens and should not be used as a sole basis for treatment. Nasal washings and aspirates are unacceptable for Xpert Xpress SARS-CoV-2/FLU/RSV testing.  Fact Sheet for Patients: EntrepreneurPulse.com.au  Fact Sheet for Healthcare Providers: IncredibleEmployment.be  This test is not yet approved or cleared by the [place] FDA and has been authorized for detection and/or diagnosis of SARS-CoV-2 by FDA under an Emergency Use Authorization (EUA). This EUA will remain in effect (meaning this test can be used) for the duration of the COVID-19 declaration under Section 564(b)(1) of the Act, 21 U.S.C. section 360bbb-3(b)(1), unless the authorization is terminated or revoked.  Performed at [facility], [place], [place] [address]   CULTURE, BLOOD (ROUTINE X 2) w Reflex to ID Panel     Status: None   Collection Time: 02/13/2021  6:17 PM   Specimen: BLOOD  Result Value Ref Range Status   Specimen Description BLOOD BLOOD RIGHT HAND  Final   Special Requests   Final    BOTTLES DRAWN  AEROBIC AND ANAEROBIC Blood Culture adequate volume   Culture   Final    NO GROWTH 5 DAYS Performed at [facility], [address] [place], [place] [address]    Report Status 03/10/2021 FINAL  Final  CULTURE, BLOOD (ROUTINE X 2) w Reflex to ID Panel     Status: None   Collection Time: 03/01/2021  6:18 PM   Specimen: BLOOD  Result Value Ref Range Status   Specimen Description BLOOD RIGHT ANTECUBITAL  Final   Special Requests   Final    BOTTLES DRAWN AEROBIC AND ANAEROBIC Blood Culture adequate volume   Culture   Final    NO GROWTH 5 DAYS Performed at [facility], [address] [place], [place] [address]    Report Status 03/10/2021 FINAL  Final  MRSA PCR Screening     Status: None   Collection Time: 03/06/21  9:20 AM   Specimen: Nasopharyngeal   Result Value Ref Range Status   MRSA by PCR NEGATIVE NEGATIVE Final    Comment:        The GeneXpert MRSA Assay (FDA approved for NASAL specimens only), is one component of a comprehensive MRSA colonization surveillance program. It is not intended to diagnose MRSA infection nor to guide or monitor treatment for MRSA infections. Performed at [facility], [address] [place], [place] [address]   Body fluid culture w Gram Stain     Status: None   Collection Time: 03/13/2021  2:03 PM   Specimen: Pericardial; Body Fluid  Result Value Ref Range Status   Specimen Description   Final    PERICARDIAL Performed at [facility], [address], [place], [place] [address]    Special Requests   Final    PERICARDIAL Performed at [facility], [address] [place], [place] [address]    Gram Stain   Final    ABUNDANT WBC PRESENT,BOTH PMN AND MONONUCLEAR NO ORGANISMS SEEN    Culture   Final    NO GROWTH 3 DAYS Performed at [facility], [place] [address], [place], [place] [address]    Report Status 03/11/2021 FINAL  Final  Culture, BAL-quantitative w Gram Stain     Status: Abnormal   Collection Time: 03/10/21  9:49 AM   Specimen: Bronchoalveolar Lavage; Respiratory  Result Value Ref Range Status   Specimen Description   Final    BRONCHIAL ALVEOLAR LAVAGE Performed at [facility], [address], [place], [place] [address]    Special Requests   Final    NONE Performed at [facility], [address] [place], [place] [address]    Gram Stain   Final    ABUNDANT WBC PRESENT, PREDOMINANTLY MONONUCLEAR NO SQUAMOUS EPITHELIAL CELLS SEEN NO ORGANISMS SEEN    Culture (A)  Final    <10,000 COLONIES/mL Normal respiratory flora-no Staph aureus or Pseudomonas seen Performed at [facility] [address] [place], [place] [address]    Report Status 2021-03-26 FINAL  Final  Body fluid culture w Gram Stain     Status: None  (Preliminary result)   Collection Time: 03/10/21  3:49 PM   Specimen: Pleura; Body Fluid  Result Value Ref Range Status   Specimen Description   Final    PLEURAL FLUID Performed at [facility], [address], [place], [place] [address]    Special Requests   Final    NONE Performed at [facility], [address] [place], [place] [address]    Gram Stain   Final    RARE WBC PRESENT,BOTH PMN AND MONONUCLEAR NO ORGANISMS SEEN    Culture   Final    NO GROWTH 2 DAYS Performed at [facility], [place] [address] [place], [place] [address]    Report Status PENDING  Incomplete    Lab Basic Metabolic Panel: Recent Labs  Lab 03/08/21 6808 03/09/21 0543 03/09/21 1051 03/10/21 0443 03/11/21 0458 03/11/21 1604 April 01, 2021 0438  NA 136 137 136 135 136 136 137  K 3.6 3.6 3.5 3.9 3.9 3.9 3.5  CL 96* 99 98 98 99 101 102  CO2 _0 21* 21* 21*  GLUCOSE 158* 138* 154* 149* 112* 119* 117*  BUN 69* 47* 49* 52* 61* 50* 34*  CREATININE 7.29* 6.00* 6.22* 7.24* 8.18* 6.58* 4.57*  CALCIUM 6.6* 7.1* 7.3* 8.1* 7.5* 7.9* 8.4*  MG 2.1 2.0  --  2.2 2.0  --  1.8  PHOS 8.7* 5.5*  --  7.4* 7.4* 5.8* 4.1  4.3   Liver Function Tests: Recent Labs  Lab 03/06/21 0548 02/23/2021 0420 03/09/21 1051 03/10/21 0443 03/11/21 1604 April 01, 2021 0438  AST 74* 104* 49* 32  --   --   ALT 98* 128* 103* 85*  --   --   ALKPHOS 83 91 77 77  --   --   BILITOT 0.9 0.9 0.7 0.8  --   --   PROT 6.6 6.6 6.3* 6.6  --   --   ALBUMIN 3.1* 3.3* 2.8* 2.8* 2.5* 2.5*   No results for input(s): LIPASE, AMYLASE in the last 168 hours. No results for input(s): AMMONIA in the last 168 hours. CBC: Recent Labs  Lab 03/06/21 0548 03/06/21 2101 03/06/2021 0420 03/08/21 8110 03/09/21 0543 03/10/21 0443 03/11/21 0458 01-Apr-2021 0438  WBC 9.2 7.4 9.8 8.7 7.7 9.0 7.6 5.8  NEUTROABS 7.4 5.9 8.6*  --   --   --   --   --   HGB 9.3* 9.4* 9.3* 9.4* 9.0* 9.5* 8.7* 9.5*  HCT 30.8* 31.2* 31.2* 31.0* 29.9* 31.8*  28.7* 30.4*  MCV 97.5 97.5 98.1 96.9 97.1 96.1 94.4 94.7  PLT 295 279 342 286 261 261 238 228   Cardiac Enzymes: No results for input(s): CKTOTAL, CKMB, CKMBINDEX, TROPONINI in the last 168 hours. Sepsis Labs: Recent Labs  Lab 03/06/21 2101 03/06/21 2324 03/14/2021 0420 03/08/21 3159 03/09/21 0543 03/10/21 0443 03/11/21 0458 03/11/21 1045 03/11/21 1335 2021/04/01 0438  PROCALCITON 1.05  --  1.01 1.56  --   --   --   --   --   --   WBC 7.4  --  9.8 8.7 7.7 9.0 7.6  --   --  5.8  LATICACIDVEN 2.2* 1.6  --   --   --   --   --  1.3 1.2  --      Cyncere Ruhe 04/01/2021, 1:32 PM

## 2021-03-15 NOTE — Progress Notes (Signed)
Patients wife and daughter at bedside. Comfort care initiated. Patient extubated 11:10. Patient resting comfortably at this time. Orders to maintain fentanyl drip for comfort. Continue to assess.

## 2021-03-15 NOTE — Consult Note (Signed)
[facility] for heparin Indication: atrial fibrillation  Allergies  Allergen Reactions  . Bupropion     Other reaction(s): Other (See Comments) Mood swings, angry Other reaction(s): Other (See Comments) Mood swings, angry    Patient Measurements: Height: 6' 0.01" (182.9 cm) Weight: 120.3 kg (265 lb 3.4 oz) IBW/kg (Calculated) : 77.62 Heparin Dosing Weight: 101.9  Vital Signs: Temp: 100.22 F (37.9 C) (03/29 0515) Temp Source: Esophageal (03/29 0515) BP: 128/80 (03/29 0515) Pulse Rate: 69 (03/29 0515)  Labs: Recent Labs    03/09/21 1512 03/09/21 2309 03/10/21 0443 03/11/21 0458 03/11/21 1604 Apr 09, 2021 0438  HGB  --    < > 9.5* 8.7*  --  9.5*  HCT  --   --  31.8* 28.7*  --  30.4*  PLT  --   --  261 238  --  228  APTT 92*  --   --   --   --   --   HEPARINUNFRC  --    < > 0.64 0.49  --  0.38  CREATININE  --   --  7.24* 8.18* 6.58*  --    < > = values in this interval not displayed.    Estimated Creatinine Clearance: 13.6 mL/min (A) (by C-G formula based on SCr of 6.58 mg/dL (H)).   Medications:  Apixaban 2.5 mg BID prior to admission  Assessment: 75 yo M presented with respiratory distress with hypotension and afib with RVR, HR 160. Pt receives HD MWF. PMH includes COPD, depression, diabetes, HLD, and HTN. Pt with symptomatic afib on 3/24 - cardioverted and started on heparin gtt. Heparin gtt was stopped 3/24 while pt was in cath lab. Pharmacy consulted to restart and monitor heparin.  Hgb: 9; plts: 261  Goal of Therapy:  aPTT goal 66-102 Heparin level 0.3-0.7 units/ml Monitor platelets by anticoagulation protocol: Yes    3/24 2357 aPTT 61, subtherapeutic 3/25 1123 aPTT 62, subtherapeutic 3/25 2312 aPTT 69, therapeutic 3/26 0543 aPTT 65, subtherapeutic, HL 0.74 3/26 1512 aPTT 92, therapeutic 3/26 2309 HL 0.62, therapeutic 3/27 0443 HL 0.64, therapeutic x 2 3/28 0458 HL 0.49, therapeutic x 3 3/29 0438 HL 0.38,  therapeutic X 4    Plan:   3/29: HL @ 5537 = 0.38 Will continue pt on current rate and recheck HL on 3/30 with AM labs.  Brookelle Pellicane D  04-09-2021 6:08 AM

## 2021-03-15 NOTE — Progress Notes (Signed)
[facility]  ROUNDING NOTE   Subjective:   CRRT - net +565 Weaned off norepinephrine. On vasopressin and phenylephrine.   Tmax 100.4  Wife currently hospitalized. Possible comfort care today.   Objective:  Vital signs in last 24 hours:  Temp:  [96.98 F (36.1 C)-100.4 F (38 C)] 99.5 F (37.5 C) (03/29 0900) Pulse Rate:  [25-132] 75 (03/29 0900) Resp:  [15-20] 20 (03/29 0900) BP: (68-137)/(43-84) 103/63 (03/29 0900) SpO2:  [94 %-100 %] 99 % (03/29 0900) FiO2 (%):  [35 %-40 %] 35 % (03/29 0739) Weight:  [120.3 kg] 120.3 kg (03/29 0500)  Weight change: -3 kg Filed Weights   03/10/21 0500 03/11/21 0258 04-06-2021 0500  Weight: 123.1 kg 123.3 kg 120.3 kg    Intake/Output: I/O last 3 completed shifts: In: 5081.4 [I.V.:4579.4; NG/GT:302; IV Piggyback:200] Out: 3117 [Urine:114; BRAXE:9407; Chest Tube:597]   Intake/Output this shift:  Total I/O In: 235.4 [I.V.:235.4] Out: 260 [Urine:10; Other:230; Chest Tube:20]  Physical Exam: General: Critically ill  Head: ETT   Eyes: Anicteric, PERRL  Neck: trachea midline  Lungs:  PRVC FiO 35%, left chest tube  Heart: Irregular, atrial flutter  Abdomen:  Soft, obese,   Extremities: + peripheral edema.  Neurologic: Intubated and sedated  Skin: No lesions  Access: RIJ permcath    Basic Metabolic Panel: Recent Labs  Lab 03/08/21 0632 03/09/21 0543 03/09/21 1051 03/10/21 0443 03/11/21 0458 03/11/21 1604 Apr 06, 2021 0438  NA 136 137 136 135 136 136 137  K 3.6 3.6 3.5 3.9 3.9 3.9 3.5  CL 96* 99 98 98 99 101 102  CO2 _0 21* 21* 21*  GLUCOSE 158* 138* 154* 149* 112* 119* 117*  BUN 69* 47* 49* 52* 61* 50* 34*  CREATININE 7.29* 6.00* 6.22* 7.24* 8.18* 6.58* 4.57*  CALCIUM 6.6* 7.1* 7.3* 8.1* 7.5* 7.9* 8.4*  MG 2.1 2.0  --  2.2 2.0  --  1.8  PHOS 8.7* 5.5*  --  7.4* 7.4* 5.8* 4.1  4.3    Liver Function Tests: Recent Labs  Lab 02/19/2021 1325 03/06/21 0548 02/22/2021 0420 03/09/21 1051 03/10/21 0443  03/11/21 1604 04/06/2021 0438  AST 142* 74* 104* 49* 32  --   --   ALT 110* 98* 128* 103* 85*  --   --   ALKPHOS 88 83 91 77 77  --   --   BILITOT 0.9 0.9 0.9 0.7 0.8  --   --   PROT 6.4* 6.6 6.6 6.3* 6.6  --   --   ALBUMIN 3.0* 3.1* 3.3* 2.8* 2.8* 2.5* 2.5*   No results for input(s): LIPASE, AMYLASE in the last 168 hours. No results for input(s): AMMONIA in the last 168 hours.  CBC: Recent Labs  Lab 02/20/2021 1325 03/06/21 0548 03/06/21 2101 02/15/2021 0420 03/08/21 6808 03/09/21 0543 03/10/21 0443 03/11/21 0458 04/06/2021 0438  WBC 8.1 9.2 7.4 9.8 8.7 7.7 9.0 7.6 5.8  NEUTROABS 6.7 7.4 5.9 8.6*  --   --   --   --   --   HGB 9.3* 9.3* 9.4* 9.3* 9.4* 9.0* 9.5* 8.7* 9.5*  HCT 30.2* 30.8* 31.2* 31.2* 31.0* 29.9* 31.8* 28.7* 30.4*  MCV 95.3 97.5 97.5 98.1 96.9 97.1 96.1 94.4 94.7  PLT 300 295 279 342 286 261 261 238 228    Cardiac Enzymes: No results for input(s): CKTOTAL, CKMB, CKMBINDEX, TROPONINI in the last 168 hours.  BNP: Invalid input(s): POCBNP  CBG: Recent Labs  Lab 03/11/21 1637 03/11/21 1947 03/11/21  2357 14-Mar-2021 0406 2021-03-14 0727  GLUCAP 114* 116* 114* 120* 103*    Microbiology: Results for orders placed or performed during the hospital encounter of 03/14/2021  Urine Culture     Status: None   Collection Time: 02/19/2021  2:51 PM   Specimen: Urine, Random  Result Value Ref Range Status   Specimen Description   Final    URINE, RANDOM Performed at [facility], [address], [place], [place] [address]    Special Requests   Final    NONE Performed at [facility], [address] [place], [place] [address]    Culture   Final    NO GROWTH Performed at [facility], [place] [address] [place], [place] [address]    Report Status 02/14/2021 FINAL  Final  Resp Panel by RT-PCR (Flu A&B, Covid) Nasopharyngeal Swab     Status: None   Collection Time: 02/28/2021  3:45 PM   Specimen: Nasopharyngeal Swab; Nasopharyngeal(NP) swabs in vial  transport medium  Result Value Ref Range Status   SARS Coronavirus 2 by RT PCR NEGATIVE NEGATIVE Final    Comment: (NOTE) SARS-CoV-2 target nucleic acids are NOT DETECTED.  The SARS-CoV-2 RNA is generally detectable in upper respiratory specimens during the acute phase of infection. The lowest concentration of SARS-CoV-2 viral copies this assay can detect is 138 copies/mL. A negative result does not preclude SARS-Cov-2 infection and should not be used as the sole basis for treatment or other patient management decisions. A negative result may occur with  improper specimen collection/handling, submission of specimen other than nasopharyngeal swab, presence of viral mutation(s) within the areas targeted by this assay, and inadequate number of viral copies(<138 copies/mL). A negative result must be combined with clinical observations, patient history, and epidemiological information. The expected result is Negative.  Fact Sheet for Patients:  EntrepreneurPulse.com.au  Fact Sheet for Healthcare Providers:  IncredibleEmployment.be  This test is no t yet approved or cleared by the [place] FDA and  has been authorized for detection and/or diagnosis of SARS-CoV-2 by FDA under an Emergency Use Authorization (EUA). This EUA will remain  in effect (meaning this test can be used) for the duration of the COVID-19 declaration under Section 564(b)(1) of the Act, 21 U.S.C.section 360bbb-3(b)(1), unless the authorization is terminated  or revoked sooner.       Influenza A by PCR NEGATIVE NEGATIVE Final   Influenza B by PCR NEGATIVE NEGATIVE Final    Comment: (NOTE) The Xpert Xpress SARS-CoV-2/FLU/RSV plus assay is intended as an aid in the diagnosis of influenza from Nasopharyngeal swab specimens and should not be used as a sole basis for treatment. Nasal washings and aspirates are unacceptable for Xpert Xpress SARS-CoV-2/FLU/RSV testing.  Fact  Sheet for Patients: EntrepreneurPulse.com.au  Fact Sheet for Healthcare Providers: IncredibleEmployment.be  This test is not yet approved or cleared by the [place] FDA and has been authorized for detection and/or diagnosis of SARS-CoV-2 by FDA under an Emergency Use Authorization (EUA). This EUA will remain in effect (meaning this test can be used) for the duration of the COVID-19 declaration under Section 564(b)(1) of the Act, 21 U.S.C. section 360bbb-3(b)(1), unless the authorization is terminated or revoked.  Performed at [facility], [place], [place] [address]   CULTURE, BLOOD (ROUTINE X 2) w Reflex to ID Panel     Status: None   Collection Time: 03/04/2021  6:17 PM   Specimen: BLOOD  Result Value Ref Range Status  Specimen Description BLOOD BLOOD RIGHT HAND  Final   Special Requests   Final    BOTTLES DRAWN AEROBIC AND ANAEROBIC Blood Culture adequate volume   Culture   Final    NO GROWTH 5 DAYS Performed at [facility], [address] [place], [place] [address]    Report Status 03/10/2021 FINAL  Final  CULTURE, BLOOD (ROUTINE X 2) w Reflex to ID Panel     Status: None   Collection Time: 02/22/2021  6:18 PM   Specimen: BLOOD  Result Value Ref Range Status   Specimen Description BLOOD RIGHT ANTECUBITAL  Final   Special Requests   Final    BOTTLES DRAWN AEROBIC AND ANAEROBIC Blood Culture adequate volume   Culture   Final    NO GROWTH 5 DAYS Performed at [facility], [address] [place], [place] [address]    Report Status 03/10/2021 FINAL  Final  MRSA PCR Screening     Status: None   Collection Time: 03/06/21  9:20 AM   Specimen: Nasopharyngeal  Result Value Ref Range Status   MRSA by PCR NEGATIVE NEGATIVE Final    Comment:        The GeneXpert MRSA Assay (FDA approved for NASAL specimens only), is one component of a comprehensive MRSA colonization surveillance program. It  is not intended to diagnose MRSA infection nor to guide or monitor treatment for MRSA infections. Performed at [facility], [address] [place], [place] [address]   Body fluid culture w Gram Stain     Status: None   Collection Time: 03/06/2021  2:03 PM   Specimen: Pericardial; Body Fluid  Result Value Ref Range Status   Specimen Description   Final    PERICARDIAL Performed at [facility], [address] [place], [place] [address]    Special Requests   Final    PERICARDIAL Performed at [facility], [address] [place], [place] [address]    Gram Stain   Final    ABUNDANT WBC PRESENT,BOTH PMN AND MONONUCLEAR NO ORGANISMS SEEN    Culture   Final    NO GROWTH 3 DAYS Performed at [facility], [place] [address] [place], [place] [address]    Report Status 03/11/2021 FINAL  Final  Culture, BAL-quantitative w Gram Stain     Status: Abnormal   Collection Time: 03/10/21  9:49 AM   Specimen: Bronchoalveolar Lavage; Respiratory  Result Value Ref Range Status   Specimen Description   Final    BRONCHIAL ALVEOLAR LAVAGE Performed at [facility], [address] [place], [place] [address]    Special Requests   Final    NONE Performed at [facility], [address] [place], [place] [address]    Gram Stain   Final    ABUNDANT WBC PRESENT, PREDOMINANTLY MONONUCLEAR NO SQUAMOUS EPITHELIAL CELLS SEEN NO ORGANISMS SEEN    Culture (A)  Final    <10,000 COLONIES/mL Normal respiratory flora-no Staph aureus or Pseudomonas seen Performed at [place] [facility] [address], [place], [place] [address]    Report Status 03-31-21 FINAL  Final  Body fluid culture w Gram Stain     Status: None (Preliminary result)   Collection Time: 03/10/21  3:49 PM   Specimen: Pleura; Body Fluid  Result Value Ref Range Status   Specimen Description   Final    PLEURAL FLUID Performed at [facility], [address], [place],   [address]  Special Requests   Final    NONE Performed at [facility], [place], [place] [address]    Gram Stain   Final    RARE WBC PRESENT,BOTH PMN AND MONONUCLEAR NO ORGANISMS SEEN    Culture   Final    NO GROWTH < 12 HOURS Performed at [facility], [place] [address] [place], [place] [address]    Report Status PENDING  Incomplete    Coagulation Studies: No results for input(s): LABPROT, INR in the last 72 hours.  Urinalysis: Recent Labs    03/10/21 1305  COLORURINE YELLOW*  LABSPEC 1.017  PHURINE 5.0  GLUCOSEU NEGATIVE  HGBUR MODERATE*  BILIRUBINUR NEGATIVE  KETONESUR NEGATIVE  PROTEINUR 100*  NITRITE NEGATIVE  LEUKOCYTESUR TRACE*      Imaging: CT CHEST WO CONTRAST  Result Date: 03/10/2021 CLINICAL DATA:  Atelectasis EXAM: CT CHEST WITHOUT CONTRAST TECHNIQUE: Multidetector CT imaging of the chest was performed following the standard protocol without IV contrast. COMPARISON:  02/21/2021 FINDINGS: Cardiovascular: No significant vascular findings. Cardiomegaly. Three-vessel coronary artery calcifications. Moderate pericardial effusion, increased in size compared to prior examination Mediastinum/Nodes: No enlarged mediastinal, hilar, or axillary lymph nodes. Thyroid gland, trachea, and esophagus demonstrate no significant findings. Lungs/Pleura: Endotracheal intubation, tip within the lower trachea. There is a new, large left pleural effusion and total atelectasis of the left lung. The left lower lobe airways are fluid-filled. There is a new, moderate right pleural effusion, with essentially complete atelectasis of the right lower lobe. Upper Abdomen: No acute abnormality. Esophagogastric tube with tip and side port below the diaphragm. Musculoskeletal: No chest wall mass or suspicious bone lesions identified. IMPRESSION: 1. There is a new, large left pleural effusion and total atelectasis of the left lung. The left lower lobe airways are  fluid-filled, suggesting aspiration. 2. There is a new, moderate right pleural effusion, with essentially complete atelectasis of the right lower lobe. 3. Moderate pericardial effusion, increased in size compared to prior examination. 4. Support apparatus including endotracheal tube appears appropriately positioned. 5. Cardiomegaly and coronary artery disease. Electronically Signed   By: Eddie Candle M.D.   On: 03/10/2021 10:56   DG Chest Port 1 View  Result Date: 25-Mar-2021 CLINICAL DATA:  Check endotracheal tube placement EXAM: PORTABLE CHEST 1 VIEW COMPARISON:  Film from the previous day. FINDINGS: Endotracheal tube, gastric catheter, esophageal probe and right jugular dialysis catheter are again seen and stable. Cardiac shadow remains enlarged. Bibasilar atelectatic changes and right-sided effusion are again seen and stable. No new focal abnormality is noted. IMPRESSION: No change from the prior exam. Electronically Signed   By: Inez Catalina M.D.   On: 2021/03/25 01:56   DG Chest Port 1 View  Result Date: 03/11/2021 CLINICAL DATA:  Follow-up chest tube in pleural effusions. EXAM: PORTABLE CHEST 1 VIEW COMPARISON:  Chest radiograph March 10, 2021 FINDINGS: Stable position of the left-sided chest 2. Endotracheal tube with tip overlying the midthoracic trachea. Esophageal temperature probe. Right approach central venous catheter with tip overlying the superior cavoatrial junction. Nasogastric tube coursing below the diaphragm with tip obscured by collimation. Stable cardiomegaly. Similar layering small right pleural effusion. No visible left pleural effusion. Similar dependent airspace opacity. No visible pneumothorax. The visualized skeletal structures are unchanged. IMPRESSION: Stable chest radiograph with left chest tube in place. No visible left pleural effusion. Similar layering small right pleural effusion and dependent bibasilar airspace opacities. Electronically Signed   By: Dahlia Bailiff MD   On:  03/11/2021 03:02  DG Chest Port 1 View  Result Date: 03/10/2021 CLINICAL DATA:  Status post chest tube placement EXAM: PORTABLE CHEST 1 VIEW COMPARISON:  03/10/2021 FINDINGS: Interval placement of left-sided chest tube with essentially complete resolution of previously seen left pleural effusion. No significant pneumothorax. Persistent, layering right pleural effusion. Cardiomegaly. Unchanged support apparatus the endotracheal tube, esophagogastric tube, and large-bore right neck multi lumen vascular catheter. IMPRESSION: 1. Interval placement of left-sided chest tube with essentially complete resolution of previously seen left pleural effusion. No significant pneumothorax. 2. Persistent, layering right pleural effusion. 3. Cardiomegaly. 4. Unchanged support apparatus. Electronically Signed   By: Eddie Candle M.D.   On: 03/10/2021 16:19     Medications:   . sodium chloride 10 mL/hr at 03/22/2021 0900  . amiodarone 30 mg/hr (March 22, 2021 0900)  . fentaNYL infusion INTRAVENOUS 250 mcg/hr (03-22-2021 0900)  . heparin 2,000 Units/hr (22-Mar-2021 0900)  . phenylephrine (NEO-SYNEPHRINE) Adult infusion 115 mcg/min (2021-03-22 0900)  . prismasol BGK 2/2.5 dialysis solution 2,000 mL/hr at 03/11/21 1807  . prismasol BGK 2/2.5 replacement solution 500 mL/hr at 03/11/21 1015  . prismasol BGK 2/2.5 replacement solution 500 mL/hr at 03/11/21 1015  . propofol (DIPRIVAN) infusion 20 mcg/kg/min (03-22-21 0900)  . vasopressin 0.03 Units/min (22-Mar-2021 0900)   . chlorhexidine gluconate (MEDLINE KIT)  15 mL Mouth Rinse BID  . Chlorhexidine Gluconate Cloth  6 each Topical Daily  . docusate  100 mg Per Tube BID  . DULoxetine  120 mg Oral Daily  . epoetin (EPOGEN/PROCRIT) injection  4,000 Units Intravenous Q M,W,F-HD  . ezetimibe  10 mg Per Tube Daily  . insulin aspart  0-9 Units Subcutaneous Q4H  . mouth rinse  15 mL Mouth Rinse 10 times per day  . midodrine  10 mg Per Tube TID  . mirtazapine  45 mg Per Tube QHS  .  pantoprazole sodium  40 mg Per Tube BID  . polyethylene glycol  17 g Per Tube Daily  . rOPINIRole  4 mg Per Tube BID AC  . rosuvastatin  10 mg Per Tube QHS  . sodium chloride flush  3 mL Intravenous Q12H  . sucralfate  1 g Per Tube TID   acetaminophen, ALPRAZolam, benzonatate, clonazepam, guaiFENesin, heparin, hydrocortisone cream, lidocaine HCl (PF), metoprolol tartrate, sodium chloride  Assessment/ Plan:  [name] is a 75 y.o. white male with end stage renal disease on hemodialysis, hypertension, sleep apnea, hyperlipidemia, diabetes mellitus type II, COPD, depression, gastric bypass, renal cell carcinoma status post nephrectomy who is admitted to [facility] on 02/12/2021 for UTI (urinary tract infection) [N39.0] Acute cystitis without hematuria [N30.00] Generalized weakness [R53.1] Atypical atrial flutter ([place]) [I48.4]  [facility] R permcath 113kg  1. End Stage Renal Disease: requiring renal replacement therapy.  - CVVHD due to hemodynamic instability.   2. Hypotension: secondary to sepsis and shock. Status post pericardiocentesis for pericardial effusion.   Requiring vasopressors: midodrine, vasopressin, phenylephrine.  3. Acute respiratory failure with empyema: requiring intubation and mechanical ventilation.   4. Atrial fibrillation with ventricular tachycardia  - amiodarone gtt  5. 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    LOS: 7 Kailena Lubas Apr 08, 20229:23 AM

## 2021-03-15 NOTE — Progress Notes (Signed)
Pt was suctioned for a small amount of secretions. Per Palliative orders, he was extubated to comfort care.

## 2021-03-15 NOTE — Progress Notes (Signed)
   March 17, 2021 1315  Clinical Encounter Type  Visited With Patient and family together  Visit Type Spiritual support  Referral From Nurse  Consult/Referral To Chaplain  Spiritual Encounters  Spiritual Needs Grief support;Emotional;Prayer  Chaplain Miral Hoopes and Mariella Saa responded to a page in [name], [name]. Pt died, his sister and cousin were present. We provided reflective listening, Grief support, and prayer.

## 2021-03-15 NOTE — Progress Notes (Signed)
Patient pronounced at 12:53 by Elenora Gamma RN and Marguarite Arbour RN. Patients EKG asystole. When auscultating heart and lung sounds none present. When palpating pulses non present. Dr. Mortimer Fries notified, [facility] called. Family at bedside. Daughter, Angela Nevin took patients clothing and shoes.

## 2021-03-15 NOTE — Progress Notes (Signed)
[place] for Electrolyte Monitoring and Replacement   Recent Labs: Potassium (mmol/L)  Date Value  04-01-21 3.5  01/10/2015 4.3   Magnesium (mg/dL)  Date Value  Apr 01, 2021 1.8  06/22/2014 1.7 (L)   Calcium (mg/dL)  Date Value  Apr 01, 2021 8.4 (L)   Calcium, Total (mg/dL)  Date Value  01/10/2015 9.2   Albumin (g/dL)  Date Value  04-01-2021 2.5 (L)  06/22/2014 3.2 (L)   Phosphorus (mg/dL)  Date Value  April 01, 2021 4.1  04-01-2021 4.3  06/22/2014 2.7   Sodium (mmol/L)  Date Value  April 01, 2021 137  01/10/2015 142   Assessment: [name] is a 75 y.o. male admitted on 02/19/2021 with sepsis thought to be secondary to UTI. Patient further with acute renal failure requiring RRT. Pharmacy has been consulted to assist in electrolyte monitoring and replacement as indicated.  CRRT initiated 3/28  Goal of Therapy:  K: 4 - 5.1  Mg: 2 - 2.4 All other electrolytes within normal limits   Plan:  3/28 AM labs: Na 137, K 3.5, Phos 4.3, Mg 1.8 --Potassium management per CRRT --Monitor for hypophosphatemia while on CRRT --Renal function panels ordered BID while on CRRT  [name] April 01, 2021

## 2021-03-15 DEATH — deceased

## 2021-03-21 LAB — BLOOD GAS, ARTERIAL
Acid-base deficit: 5.1 mmol/L — ABNORMAL HIGH (ref 0.0–2.0)
Bicarbonate: 20.6 mmol/L (ref 20.0–28.0)
FIO2: 0.5
O2 Saturation: 98.1 %
PEEP: 5 cmH2O
Patient temperature: 37
RATE: 20 resp/min
pCO2 arterial: 40 mmHg (ref 32.0–48.0)
pH, Arterial: 7.32 — ABNORMAL LOW (ref 7.350–7.450)
pO2, Arterial: 114 mmHg — ABNORMAL HIGH (ref 83.0–108.0)

## 2021-03-27 ENCOUNTER — Other Ambulatory Visit: Payer: Self-pay | Admitting: Student in an Organized Health Care Education/Training Program

## 2021-06-14 NOTE — Addendum Note (Signed)
Encounter addended by: Pricilla Larsson, [facility] on: 06/14/2021 10:43 AM  Actions taken: Order list changed

## 2021-07-02 IMAGING — CR CHEST - 2 VIEW
1 series · 3 of 3 positions shown · non-contrast
Comparison: January 24, 2019

CLINICAL DATA: Cough.  History of renal cell carcinoma.

EXAM:
CHEST - 2 VIEW

[Series 1: dg chest 2 view · 0.14mm/px · 3 of 3 slices shown]
[im 1/3]
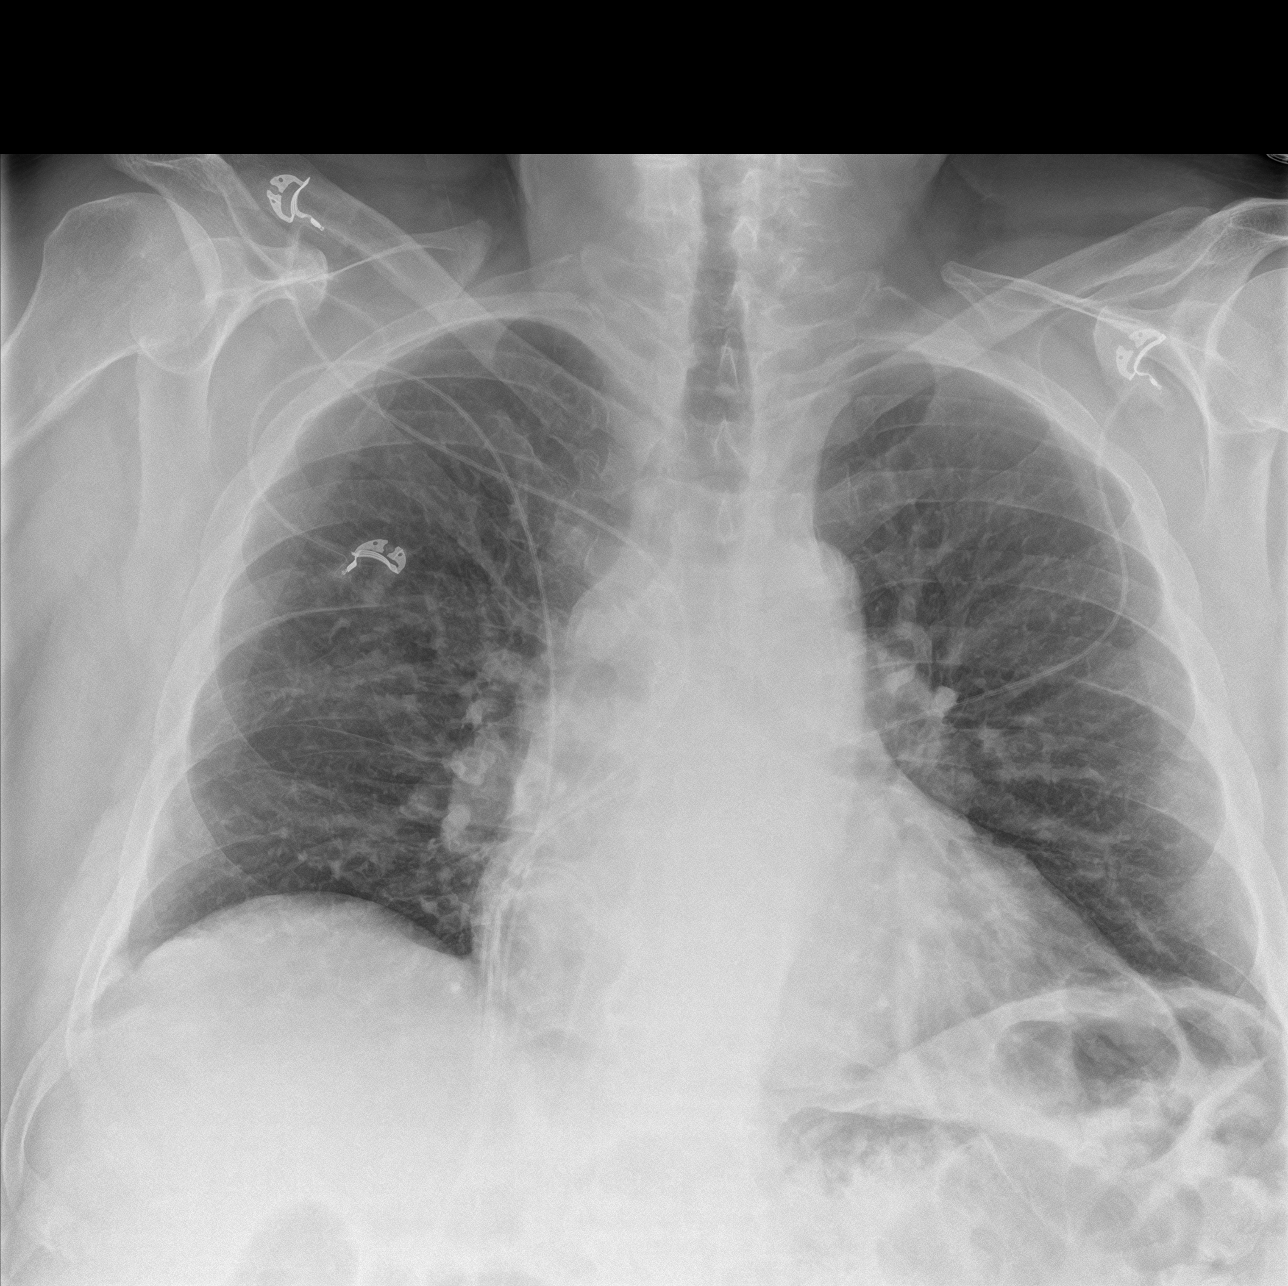
[im 2/3]
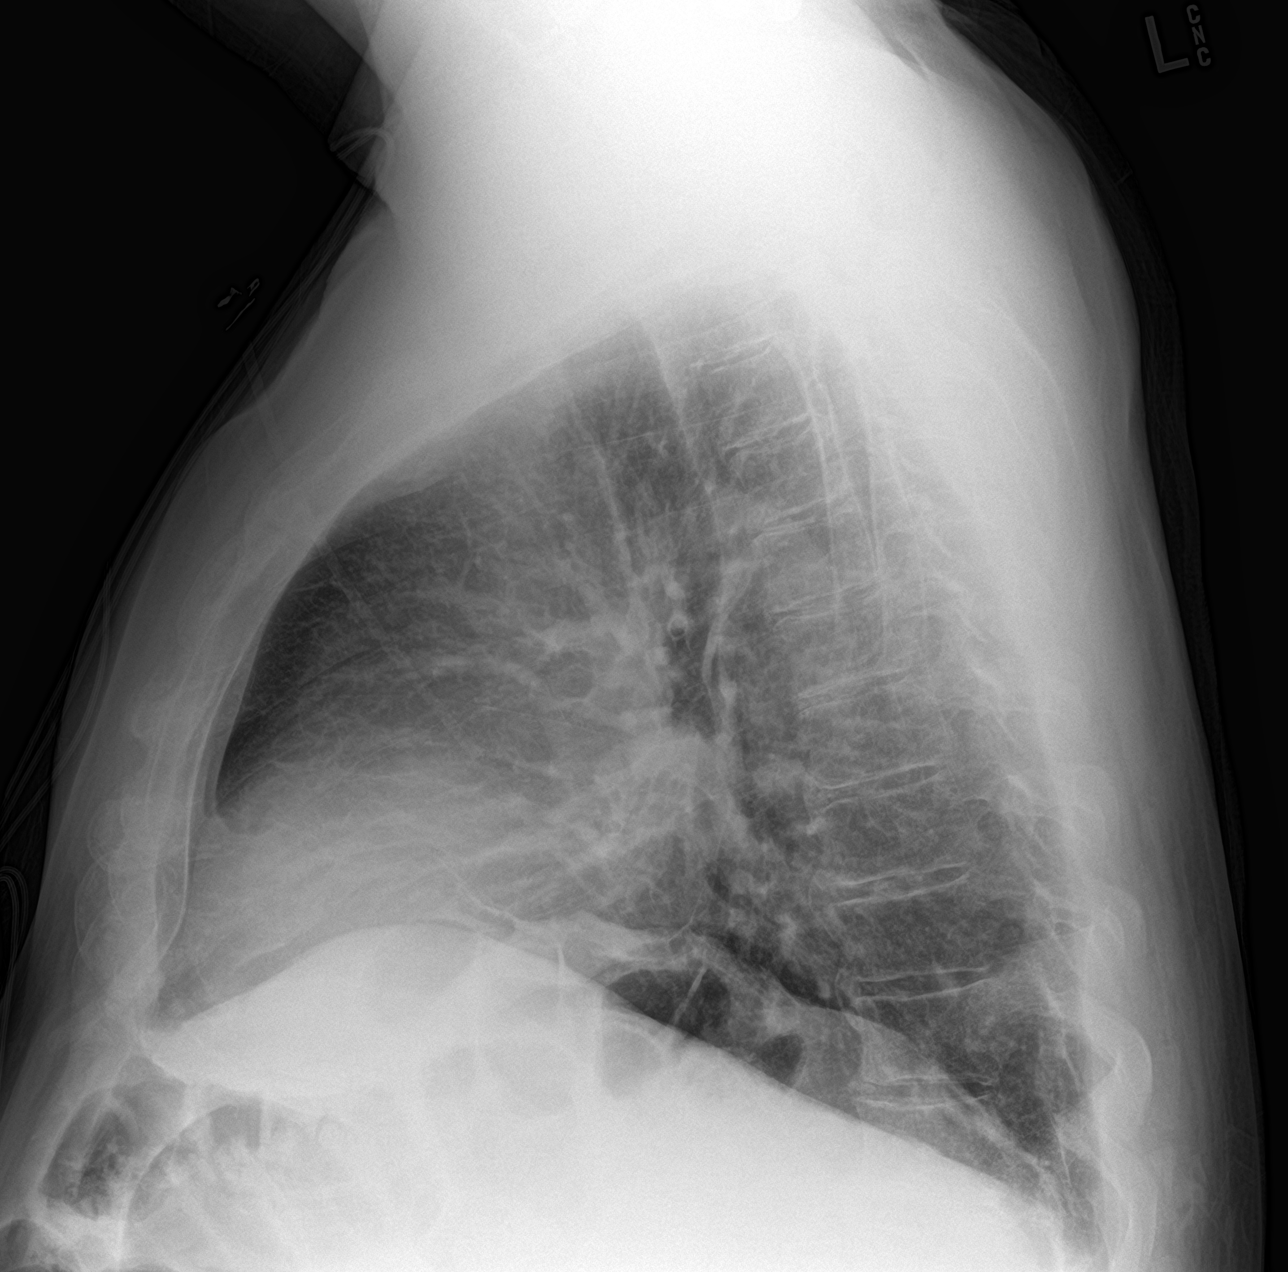
[im 3/3]
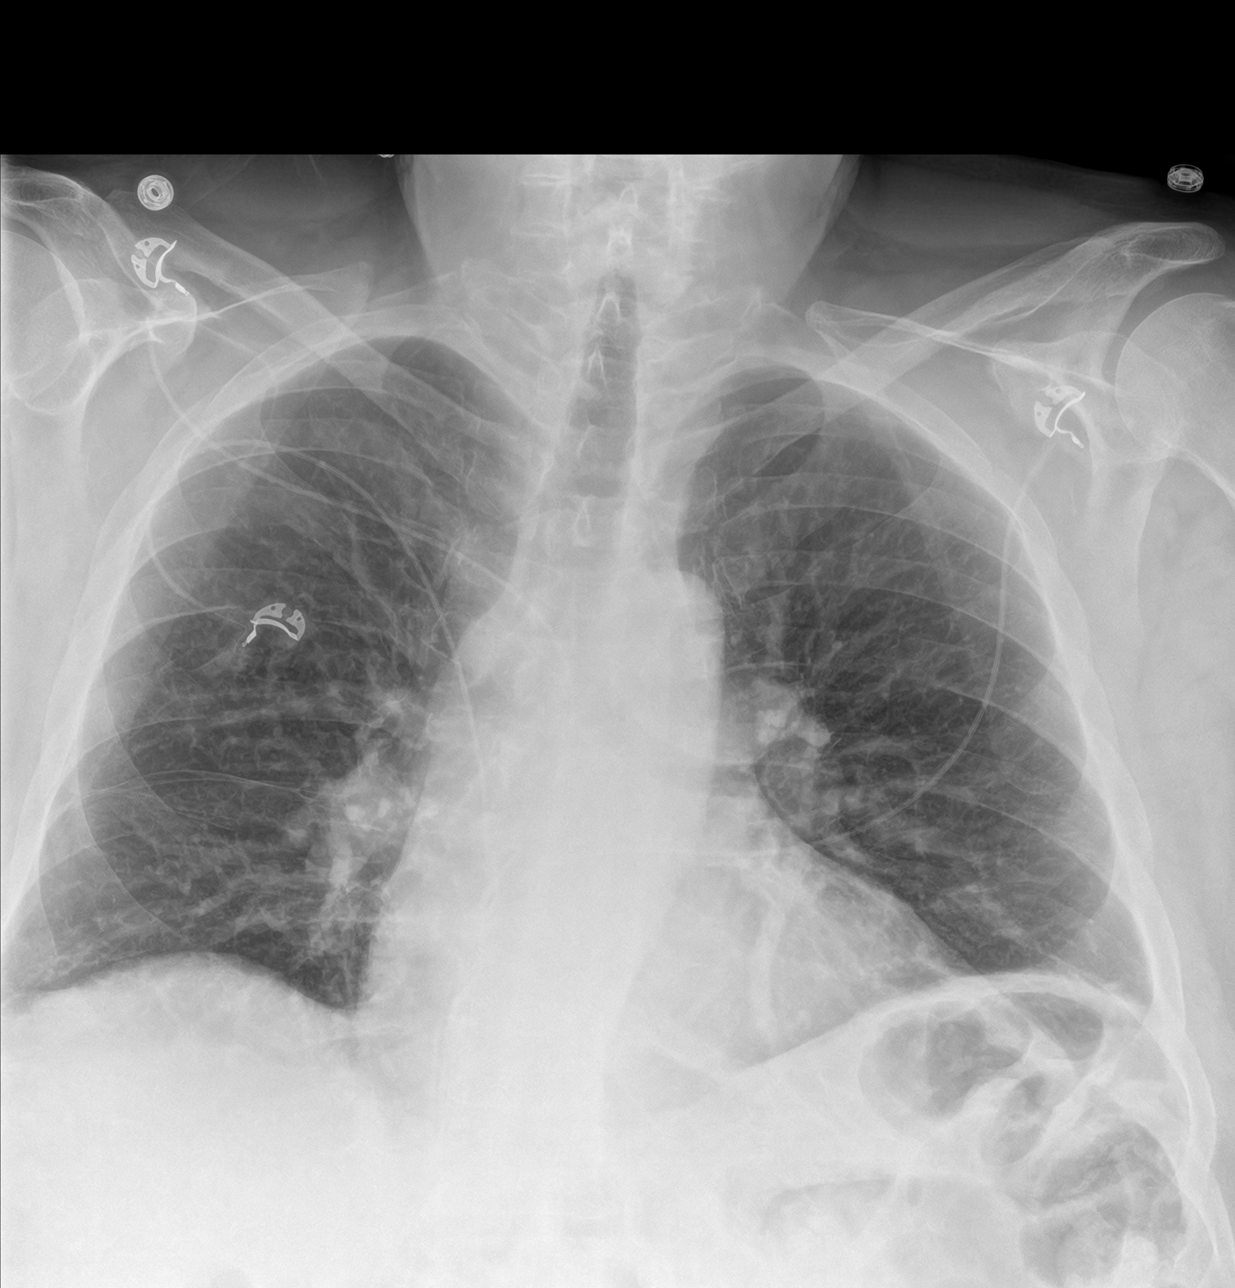

[3 of 3 positions shown; findings below may reference images not displayed]

FINDINGS: Stable mild cardiomegaly. The hila and mediastinum are unremarkable.
No pneumothorax. No pulmonary nodules or masses. Minimal atelectasis
in the left base. No focal infiltrates.
IMPRESSION: No active cardiopulmonary disease.

## 2021-07-23 ENCOUNTER — Ambulatory Visit (INDEPENDENT_AMBULATORY_CARE_PROVIDER_SITE_OTHER): Payer: Medicare Other | Admitting: Vascular Surgery

## 2022-09-26 IMAGING — DX DG CHEST 1V PORT
1 series · 2 of 2 positions shown · non-contrast
Comparison: Portable chest 03/25/2020 and earlier.

CLINICAL DATA: 73-year-old male with cough. Lower extremity
weakness. Former smoker.

EXAM:
PORTABLE CHEST 1 VIEW

[Series 1: chest ap · 0.14mm/px · 2 of 2 slices shown]
[im 1/2]
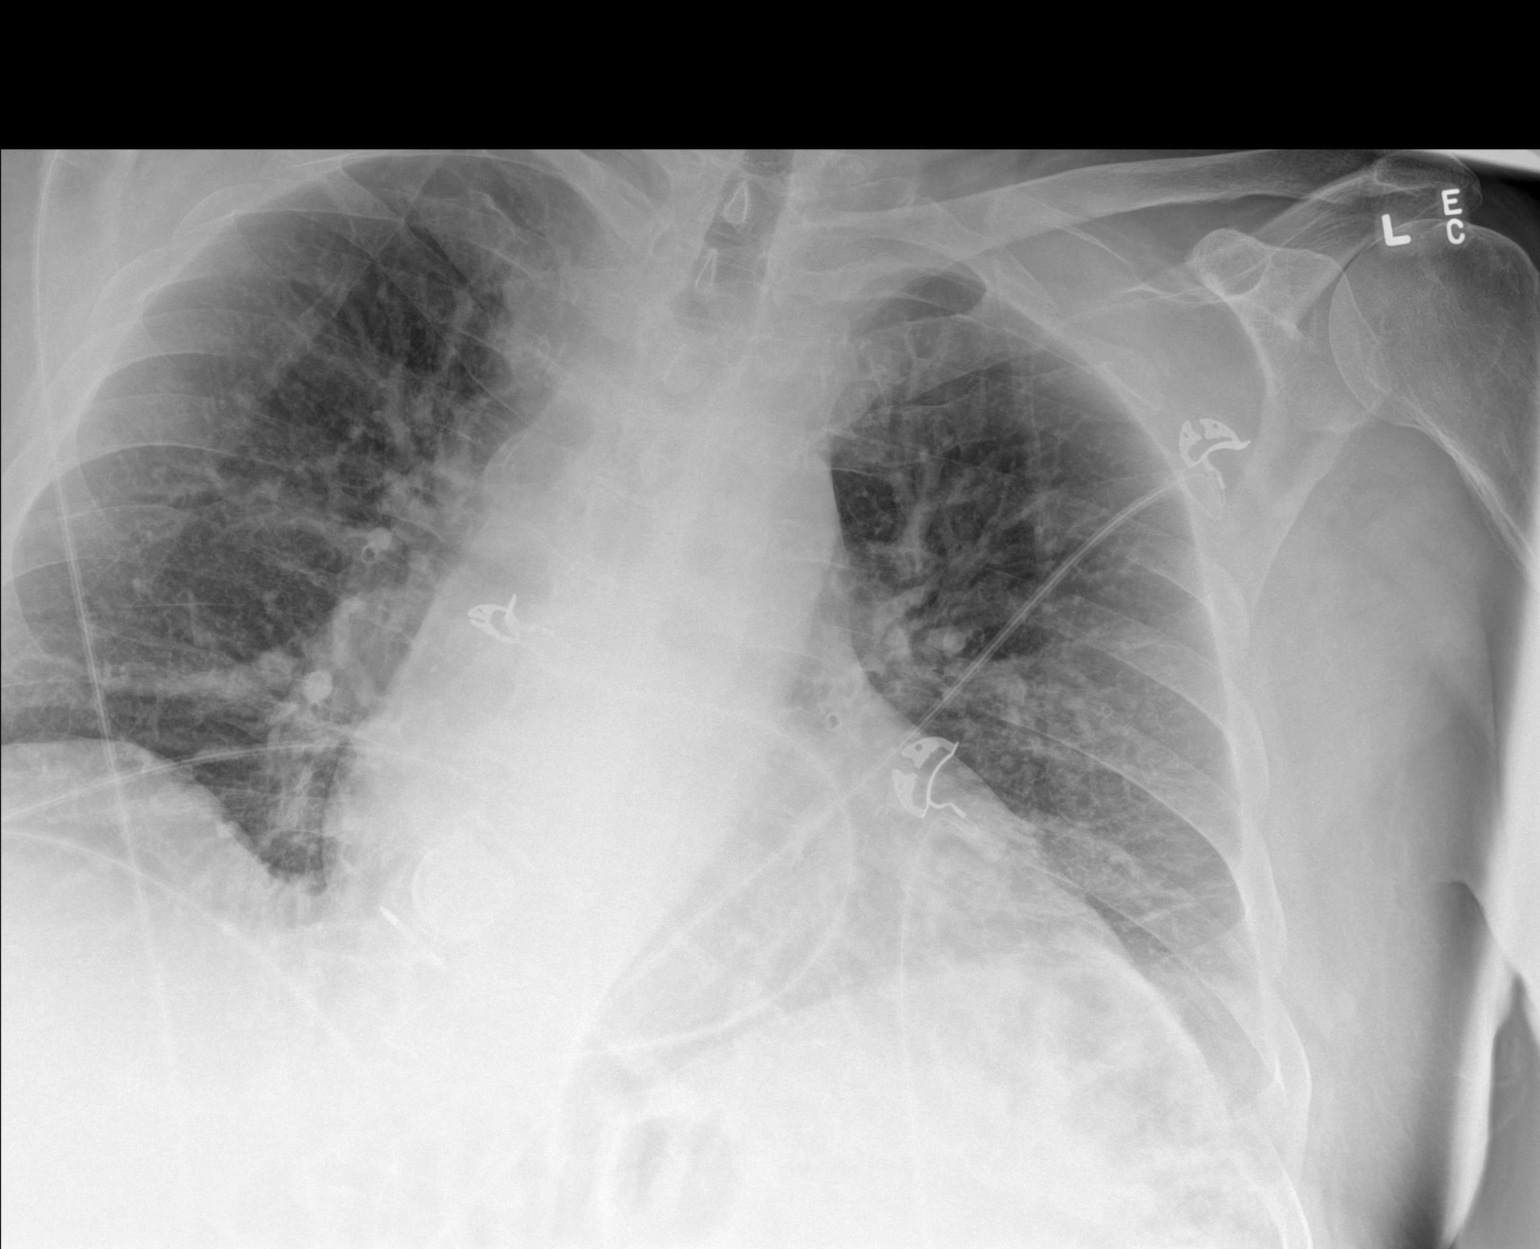
[im 2/2]
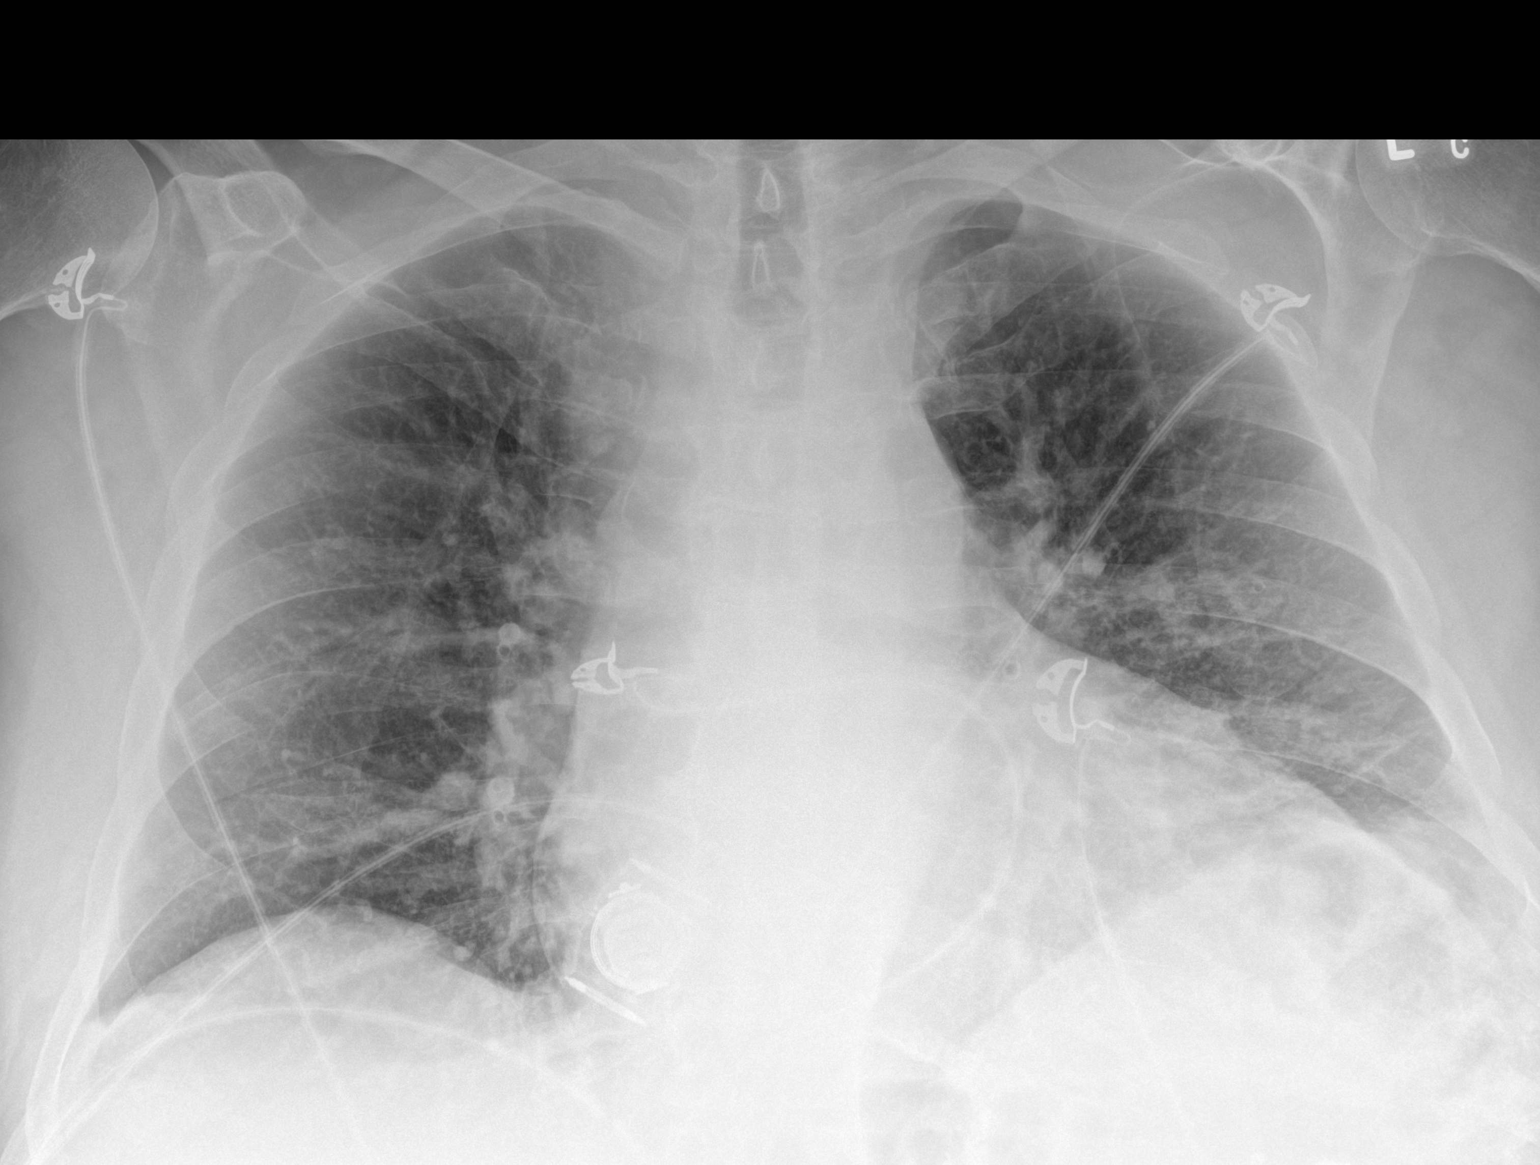

[2 of 2 positions shown; findings below may reference images not displayed]

FINDINGS: Portable AP semi upright view at 1371 hours. Stable lung volumes and
mediastinal contours. Cardiac size within normal limits. Visualized
tracheal air column is within normal limits.

Resolved patchy left lung opacity since [place]. Underlying increased
pulmonary interstitium not significantly changed. No pneumothorax,
pulmonary edema, pleural effusion or new pulmonary opacity.

There is a new electronic device projecting over the right
cardiophrenic angle of unclear significance.

No acute osseous abnormality identified.
IMPRESSION: No acute cardiopulmonary abnormality.
